# Patient Record
Sex: Male | Born: 1955
Health system: Southern US, Community
[De-identification: ages and names within clinical notes are randomized; demographics above are authoritative.]

## PROBLEM LIST (undated history)

## (undated) DIAGNOSIS — F209 Schizophrenia, unspecified: Secondary | ICD-10-CM

## (undated) DIAGNOSIS — I82409 Acute embolism and thrombosis of unspecified deep veins of unspecified lower extremity: Secondary | ICD-10-CM

## (undated) DIAGNOSIS — G40309 Generalized idiopathic epilepsy and epileptic syndromes, not intractable, without status epilepticus: Secondary | ICD-10-CM

## (undated) DIAGNOSIS — I739 Peripheral vascular disease, unspecified: Secondary | ICD-10-CM

## (undated) DIAGNOSIS — F319 Bipolar disorder, unspecified: Secondary | ICD-10-CM

## (undated) DIAGNOSIS — D649 Anemia, unspecified: Secondary | ICD-10-CM

## (undated) DIAGNOSIS — E119 Type 2 diabetes mellitus without complications: Secondary | ICD-10-CM

## (undated) DIAGNOSIS — M7500 Adhesive capsulitis of unspecified shoulder: Secondary | ICD-10-CM

## (undated) DIAGNOSIS — G609 Hereditary and idiopathic neuropathy, unspecified: Secondary | ICD-10-CM

## (undated) DIAGNOSIS — M545 Low back pain, unspecified: Secondary | ICD-10-CM

## (undated) DIAGNOSIS — G40409 Other generalized epilepsy and epileptic syndromes, not intractable, without status epilepticus: Secondary | ICD-10-CM

## (undated) DIAGNOSIS — E785 Hyperlipidemia, unspecified: Secondary | ICD-10-CM

## (undated) DIAGNOSIS — I639 Cerebral infarction, unspecified: Secondary | ICD-10-CM

## (undated) DIAGNOSIS — I10 Essential (primary) hypertension: Secondary | ICD-10-CM

## (undated) DIAGNOSIS — R251 Tremor, unspecified: Secondary | ICD-10-CM

## (undated) HISTORY — DX: Acute embolism and thrombosis of unspecified deep veins of unspecified lower extremity: I82.409

## (undated) HISTORY — DX: Peripheral vascular disease, unspecified: I73.9

## (undated) HISTORY — DX: Tremor, unspecified: R25.1

## (undated) HISTORY — DX: Low back pain, unspecified: M54.50

## (undated) HISTORY — DX: Other generalized epilepsy and epileptic syndromes, not intractable, without status epilepticus: G40.409

## (undated) HISTORY — DX: Hereditary and idiopathic neuropathy, unspecified: G60.9

## (undated) HISTORY — DX: Hyperlipidemia, unspecified: E78.5

## (undated) HISTORY — PX: ABOVE KNEE LEG AMPUTATION: SUR20

## (undated) HISTORY — DX: Bipolar disorder, unspecified: F31.9

## (undated) HISTORY — DX: Cerebral infarction, unspecified: I63.9

## (undated) HISTORY — DX: Type 2 diabetes mellitus without complications: E11.9

## (undated) HISTORY — DX: Adhesive capsulitis of unspecified shoulder: M75.00

## (undated) HISTORY — DX: Schizophrenia, unspecified: F20.9

## (undated) HISTORY — DX: Essential (primary) hypertension: I10

## (undated) HISTORY — DX: Anemia, unspecified: D64.9

## (undated) HISTORY — DX: Low back pain: M54.5

## (undated) HISTORY — PX: CORONARY STENT PLACEMENT: SHX6853

## (undated) HISTORY — DX: Generalized idiopathic epilepsy and epileptic syndromes, not intractable, without status epilepticus: G40.309

---

## 2011-07-08 ENCOUNTER — Encounter: Payer: Self-pay | Admitting: Vascular Surgery

## 2011-07-11 ENCOUNTER — Encounter: Payer: Self-pay | Admitting: Vascular Surgery

## 2011-07-12 ENCOUNTER — Ambulatory Visit (INDEPENDENT_AMBULATORY_CARE_PROVIDER_SITE_OTHER): Payer: Medicaid Other | Admitting: Vascular Surgery

## 2011-07-12 ENCOUNTER — Encounter: Payer: Self-pay | Admitting: Vascular Surgery

## 2011-07-12 VITALS — BP 165/76 | HR 51 | Resp 18 | Ht 72.0 in | Wt 250.8 lb

## 2011-07-12 DIAGNOSIS — I739 Peripheral vascular disease, unspecified: Secondary | ICD-10-CM | POA: Insufficient documentation

## 2011-07-12 HISTORY — DX: Peripheral vascular disease, unspecified: I73.9

## 2011-07-12 NOTE — Progress Notes (Signed)
[facility] and Vein Specialist of [facility]   Patient name: Matthew Gutierrez MRN: [id number] DOB: [birth year] Sex: male   Referred by: Marina Goodell  Reason for referral:  Chief Complaint  Patient presents with  . New Evaluation    referral by Dr. Marina Goodell abnormal ABI    HISTORY OF PRESENT ILLNESS: The patient presents today for evaluation of lower extremity discomfort and possible lower extremity interior of insufficiency. He has multiple lower surety complaints. He reports difficulty with prolonged walking he reports pain throughout his buttocks thighs and legs. He is a poor story and is difficult for him to give a good history of the pain. This has made it impossible for him to work since 2009 and he is seeking disability. He does report peripheral neuropathy and a low back disc disease. He does not have any history of tissue loss of his lower extremities.   Past Medical History  Diagnosis Date  . Hypertension   . Hyperlipidemia   . Diabetes mellitus   . Lumbago   . Peripheral vascular disease   . Frozen shoulder     Right Shoulder  . Epilepsy, grand mal   . Unspecified hereditary and idiopathic peripheral neuropathy   . Stroke     No past surgical history on file.  History   Social History  . Marital Status: Single    Spouse Name: N/A    Number of Children: N/A  . Years of Education: N/A   Occupational History  . Not on file.   Social History Main Topics  . Smoking status: Former Smoker    Quit date: 10/28/2009  . Smokeless tobacco: Not on file  . Alcohol Use: Yes     occassionally  . Drug Use: No  . Sexually Active:    Other Topics Concern  . Not on file   Social History Narrative  . No narrative on file    Family History  Problem Relation Age of Onset  . Hypertension Other   . Stroke Other   . Hyperlipidemia Other   . Alcohol abuse Other   . Heart disease Other   . Cancer Other     Allergies as of 07/12/2011 - Review Complete 07/12/2011  Allergen  Reaction Noted  . Codeine  07/08/2011    Current Outpatient Prescriptions on File Prior to Visit  Medication Sig Dispense Refill  . aspirin 81 MG tablet Take 81 mg by mouth daily.      Marland Kitchen atenolol-chlorthalidone (TENORETIC) 100-25 MG per tablet Take 1 tablet by mouth daily.      . clopidogrel (PLAVIX) 75 MG tablet Take 75 mg by mouth daily.      Marland Kitchen glimepiride (AMARYL) 2 MG tablet Take 2 mg by mouth daily before breakfast.      . lisinopril (PRINIVIL,ZESTRIL) 20 MG tablet Take 20 mg by mouth daily.      . metFORMIN (GLUCOPHAGE) 1000 MG tablet Take 1,000 mg by mouth 2 (two) times daily with a meal.      . Omega-3 Fatty Acids (RA FISH OIL) 1000 MG CPDR Take by mouth.      . phenytoin (DILANTIN) 100 MG ER capsule Take 200 mg by mouth 3 (three) times daily.      . pravastatin (PRAVACHOL) 20 MG tablet Take 20 mg by mouth daily.         REVIEW OF SYSTEMS:  Positives indicated with an "X"  CARDIOVASCULAR:  [ ]  chest pain   [ ]  chest pressure   [ ]   palpitations   [ ]  orthopnea   [ ]  dyspnea on exertion   [ ]  claudication   [x ] rest pain   [ ]  DVT   [ ]  phlebitis PULMONARY:   [ ]  productive cough   [ ]  asthma   [ ]  wheezing NEUROLOGIC:   [x ] weakness  [x ] paresthesias  [ ]  aphasia  [ ]  amaurosis  [ ]  dizziness HEMATOLOGIC:   [ ]  bleeding problems   [ ]  clotting disorders MUSCULOSKELETAL:  [ ]  joint pain   [ ]  joint swelling GASTROINTESTINAL: [x ]  blood in stool  [ ]   hematemesis GENITOURINARY:  [x ]  dysuria  [ ]   hematuria PSYCHIATRIC:  [ ]  history of major depression INTEGUMENTARY:  [x ] rashes  [ ]  ulcers CONSTITUTIONAL:  [ ]  fever   [ ]  chills  PHYSICAL EXAMINATION:  General: The patient is a well-nourished male, in no acute distress. Vital signs are BP 165/76  Pulse 51  Resp 18  Ht 6' (1.829 m)  Wt 250 lb 12.8 oz (113.762 kg)  BMI 34.01 kg/m2 Pulmonary: There is a good air exchange bilaterally without wheezing or rales. Abdomen: Soft and non-tender with normal pitch bowel  sounds. Musculoskeletal: There are no major deformities.  There is no significant extremity pain. Neurologic: No focal weakness or paresthesias are detected, Skin: There are no ulcer or rashes noted. Psychiatric: The patient has normal affect. Cardiovascular: There is a regular rate and rhythm without significant murmur appreciated. Carotid arteries: No bruits bilaterally Pulse status: 2+ radial 2+ femoral and 2+ popliteal pulses bilaterally. Do not palpate pedal pulses   Vascular Lab Studies:  I have lower extremity Doppler studies done at [facility] on 06/16/2011 these revealed ankle arm index of 0.84 on the right and 0.96 on the left with dampened waveforms.  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 This certainly does not correspond with the leg pain that he reports. He has subtotal leg discomfort in this appears to be more related to arthritis and degenerative disc disease. I discussed this at length with the patient and his brother-in-law present. He does not have any typical calf claudication and does not have arterial rest pain. He does have extensive neuropathy as well. I would not recommend any further evaluation or followup to do is mild tibial vessel disease. I did explain that if he should develop nonhealing wounds on his foot he needs to notify his physician immediately because this may require treatment. He will see [place] again on an as-needed basis    Lilliana Turner [facility] of [place] Office: [phone]

## 2014-06-05 DIAGNOSIS — N400 Enlarged prostate without lower urinary tract symptoms: Secondary | ICD-10-CM | POA: Diagnosis not present

## 2014-06-05 DIAGNOSIS — N183 Chronic kidney disease, stage 3 (moderate): Secondary | ICD-10-CM | POA: Diagnosis not present

## 2014-06-05 DIAGNOSIS — E119 Type 2 diabetes mellitus without complications: Secondary | ICD-10-CM | POA: Diagnosis not present

## 2014-06-05 DIAGNOSIS — Z6841 Body Mass Index (BMI) 40.0 and over, adult: Secondary | ICD-10-CM | POA: Diagnosis not present

## 2014-06-05 DIAGNOSIS — D696 Thrombocytopenia, unspecified: Secondary | ICD-10-CM | POA: Diagnosis not present

## 2014-06-05 DIAGNOSIS — I872 Venous insufficiency (chronic) (peripheral): Secondary | ICD-10-CM | POA: Diagnosis not present

## 2014-06-05 DIAGNOSIS — E1129 Type 2 diabetes mellitus with other diabetic kidney complication: Secondary | ICD-10-CM | POA: Diagnosis not present

## 2014-06-05 DIAGNOSIS — I10 Essential (primary) hypertension: Secondary | ICD-10-CM | POA: Diagnosis not present

## 2014-06-05 DIAGNOSIS — E1142 Type 2 diabetes mellitus with diabetic polyneuropathy: Secondary | ICD-10-CM | POA: Diagnosis not present

## 2014-06-05 DIAGNOSIS — I808 Phlebitis and thrombophlebitis of other sites: Secondary | ICD-10-CM | POA: Diagnosis not present

## 2014-06-05 DIAGNOSIS — E785 Hyperlipidemia, unspecified: Secondary | ICD-10-CM | POA: Diagnosis not present

## 2014-06-05 DIAGNOSIS — E1149 Type 2 diabetes mellitus with other diabetic neurological complication: Secondary | ICD-10-CM | POA: Diagnosis not present

## 2014-06-16 DIAGNOSIS — Z6841 Body Mass Index (BMI) 40.0 and over, adult: Secondary | ICD-10-CM | POA: Diagnosis not present

## 2014-06-16 DIAGNOSIS — L0390 Cellulitis, unspecified: Secondary | ICD-10-CM | POA: Diagnosis not present

## 2014-06-16 DIAGNOSIS — L97909 Non-pressure chronic ulcer of unspecified part of unspecified lower leg with unspecified severity: Secondary | ICD-10-CM | POA: Diagnosis not present

## 2014-06-16 DIAGNOSIS — L03116 Cellulitis of left lower limb: Secondary | ICD-10-CM | POA: Diagnosis not present

## 2014-06-24 DIAGNOSIS — I4891 Unspecified atrial fibrillation: Secondary | ICD-10-CM | POA: Diagnosis not present

## 2014-06-24 DIAGNOSIS — L97921 Non-pressure chronic ulcer of unspecified part of left lower leg limited to breakdown of skin: Secondary | ICD-10-CM | POA: Diagnosis not present

## 2014-06-24 DIAGNOSIS — L97919 Non-pressure chronic ulcer of unspecified part of right lower leg with unspecified severity: Secondary | ICD-10-CM | POA: Diagnosis not present

## 2014-06-24 DIAGNOSIS — Z6841 Body Mass Index (BMI) 40.0 and over, adult: Secondary | ICD-10-CM | POA: Diagnosis not present

## 2014-06-24 DIAGNOSIS — E1142 Type 2 diabetes mellitus with diabetic polyneuropathy: Secondary | ICD-10-CM | POA: Diagnosis not present

## 2014-06-27 DIAGNOSIS — L97919 Non-pressure chronic ulcer of unspecified part of right lower leg with unspecified severity: Secondary | ICD-10-CM | POA: Diagnosis not present

## 2014-06-27 DIAGNOSIS — L97929 Non-pressure chronic ulcer of unspecified part of left lower leg with unspecified severity: Secondary | ICD-10-CM | POA: Diagnosis not present

## 2014-06-27 DIAGNOSIS — Z6839 Body mass index (BMI) 39.0-39.9, adult: Secondary | ICD-10-CM | POA: Diagnosis not present

## 2014-07-01 DIAGNOSIS — L97929 Non-pressure chronic ulcer of unspecified part of left lower leg with unspecified severity: Secondary | ICD-10-CM | POA: Diagnosis not present

## 2014-07-01 DIAGNOSIS — Z6839 Body mass index (BMI) 39.0-39.9, adult: Secondary | ICD-10-CM | POA: Diagnosis not present

## 2014-07-04 DIAGNOSIS — L97929 Non-pressure chronic ulcer of unspecified part of left lower leg with unspecified severity: Secondary | ICD-10-CM | POA: Diagnosis not present

## 2014-07-23 DIAGNOSIS — D696 Thrombocytopenia, unspecified: Secondary | ICD-10-CM | POA: Diagnosis not present

## 2014-07-23 DIAGNOSIS — D609 Acquired pure red cell aplasia, unspecified: Secondary | ICD-10-CM | POA: Diagnosis not present

## 2014-08-18 DIAGNOSIS — L97929 Non-pressure chronic ulcer of unspecified part of left lower leg with unspecified severity: Secondary | ICD-10-CM | POA: Diagnosis not present

## 2014-08-18 DIAGNOSIS — L97919 Non-pressure chronic ulcer of unspecified part of right lower leg with unspecified severity: Secondary | ICD-10-CM | POA: Diagnosis not present

## 2014-08-21 DIAGNOSIS — I809 Phlebitis and thrombophlebitis of unspecified site: Secondary | ICD-10-CM | POA: Diagnosis not present

## 2014-08-21 DIAGNOSIS — L97919 Non-pressure chronic ulcer of unspecified part of right lower leg with unspecified severity: Secondary | ICD-10-CM | POA: Diagnosis not present

## 2014-08-21 DIAGNOSIS — Z6841 Body Mass Index (BMI) 40.0 and over, adult: Secondary | ICD-10-CM | POA: Diagnosis not present

## 2014-08-21 DIAGNOSIS — L97929 Non-pressure chronic ulcer of unspecified part of left lower leg with unspecified severity: Secondary | ICD-10-CM | POA: Diagnosis not present

## 2014-08-26 DIAGNOSIS — L97929 Non-pressure chronic ulcer of unspecified part of left lower leg with unspecified severity: Secondary | ICD-10-CM | POA: Diagnosis not present

## 2014-08-26 DIAGNOSIS — L97919 Non-pressure chronic ulcer of unspecified part of right lower leg with unspecified severity: Secondary | ICD-10-CM | POA: Diagnosis not present

## 2014-08-26 DIAGNOSIS — Z6841 Body Mass Index (BMI) 40.0 and over, adult: Secondary | ICD-10-CM | POA: Diagnosis not present

## 2014-09-02 DIAGNOSIS — I739 Peripheral vascular disease, unspecified: Secondary | ICD-10-CM | POA: Diagnosis not present

## 2014-09-02 DIAGNOSIS — I872 Venous insufficiency (chronic) (peripheral): Secondary | ICD-10-CM | POA: Diagnosis not present

## 2014-09-11 ENCOUNTER — Other Ambulatory Visit: Payer: Self-pay | Admitting: *Deleted

## 2014-09-11 DIAGNOSIS — I872 Venous insufficiency (chronic) (peripheral): Secondary | ICD-10-CM

## 2014-09-11 DIAGNOSIS — L97909 Non-pressure chronic ulcer of unspecified part of unspecified lower leg with unspecified severity: Principal | ICD-10-CM

## 2014-09-11 DIAGNOSIS — I83009 Varicose veins of unspecified lower extremity with ulcer of unspecified site: Secondary | ICD-10-CM

## 2014-09-18 DIAGNOSIS — K921 Melena: Secondary | ICD-10-CM | POA: Diagnosis not present

## 2014-09-18 DIAGNOSIS — K648 Other hemorrhoids: Secondary | ICD-10-CM | POA: Diagnosis not present

## 2014-09-23 DIAGNOSIS — I252 Old myocardial infarction: Secondary | ICD-10-CM | POA: Diagnosis not present

## 2014-09-23 DIAGNOSIS — L97929 Non-pressure chronic ulcer of unspecified part of left lower leg with unspecified severity: Secondary | ICD-10-CM | POA: Diagnosis not present

## 2014-09-23 DIAGNOSIS — I872 Venous insufficiency (chronic) (peripheral): Secondary | ICD-10-CM | POA: Diagnosis not present

## 2014-09-23 DIAGNOSIS — I739 Peripheral vascular disease, unspecified: Secondary | ICD-10-CM | POA: Diagnosis not present

## 2014-09-23 DIAGNOSIS — L97919 Non-pressure chronic ulcer of unspecified part of right lower leg with unspecified severity: Secondary | ICD-10-CM | POA: Diagnosis not present

## 2014-09-23 DIAGNOSIS — D696 Thrombocytopenia, unspecified: Secondary | ICD-10-CM | POA: Diagnosis not present

## 2014-09-29 DIAGNOSIS — N183 Chronic kidney disease, stage 3 (moderate): Secondary | ICD-10-CM | POA: Diagnosis not present

## 2014-09-29 DIAGNOSIS — G40309 Generalized idiopathic epilepsy and epileptic syndromes, not intractable, without status epilepticus: Secondary | ICD-10-CM | POA: Diagnosis not present

## 2014-09-29 DIAGNOSIS — E1140 Type 2 diabetes mellitus with diabetic neuropathy, unspecified: Secondary | ICD-10-CM | POA: Diagnosis not present

## 2014-09-29 DIAGNOSIS — E785 Hyperlipidemia, unspecified: Secondary | ICD-10-CM | POA: Diagnosis not present

## 2014-09-29 DIAGNOSIS — E11621 Type 2 diabetes mellitus with foot ulcer: Secondary | ICD-10-CM | POA: Diagnosis not present

## 2014-09-29 DIAGNOSIS — E1142 Type 2 diabetes mellitus with diabetic polyneuropathy: Secondary | ICD-10-CM | POA: Diagnosis not present

## 2014-09-29 DIAGNOSIS — E1129 Type 2 diabetes mellitus with other diabetic kidney complication: Secondary | ICD-10-CM | POA: Diagnosis not present

## 2014-09-29 DIAGNOSIS — I10 Essential (primary) hypertension: Secondary | ICD-10-CM | POA: Diagnosis not present

## 2014-09-30 DIAGNOSIS — D696 Thrombocytopenia, unspecified: Secondary | ICD-10-CM | POA: Diagnosis not present

## 2014-09-30 DIAGNOSIS — I252 Old myocardial infarction: Secondary | ICD-10-CM | POA: Diagnosis not present

## 2014-09-30 DIAGNOSIS — E1129 Type 2 diabetes mellitus with other diabetic kidney complication: Secondary | ICD-10-CM | POA: Diagnosis not present

## 2014-09-30 DIAGNOSIS — E785 Hyperlipidemia, unspecified: Secondary | ICD-10-CM | POA: Diagnosis not present

## 2014-09-30 DIAGNOSIS — E11621 Type 2 diabetes mellitus with foot ulcer: Secondary | ICD-10-CM | POA: Diagnosis not present

## 2014-09-30 DIAGNOSIS — I10 Essential (primary) hypertension: Secondary | ICD-10-CM | POA: Diagnosis not present

## 2014-10-07 DIAGNOSIS — Z7901 Long term (current) use of anticoagulants: Secondary | ICD-10-CM | POA: Diagnosis not present

## 2014-10-14 DIAGNOSIS — Z1211 Encounter for screening for malignant neoplasm of colon: Secondary | ICD-10-CM | POA: Diagnosis not present

## 2014-10-14 DIAGNOSIS — E119 Type 2 diabetes mellitus without complications: Secondary | ICD-10-CM | POA: Diagnosis not present

## 2014-10-14 DIAGNOSIS — I252 Old myocardial infarction: Secondary | ICD-10-CM | POA: Diagnosis not present

## 2014-10-14 DIAGNOSIS — Z7901 Long term (current) use of anticoagulants: Secondary | ICD-10-CM | POA: Diagnosis not present

## 2014-10-14 DIAGNOSIS — J45909 Unspecified asthma, uncomplicated: Secondary | ICD-10-CM | POA: Diagnosis not present

## 2014-10-14 DIAGNOSIS — I10 Essential (primary) hypertension: Secondary | ICD-10-CM | POA: Diagnosis not present

## 2014-10-14 DIAGNOSIS — D126 Benign neoplasm of colon, unspecified: Secondary | ICD-10-CM | POA: Diagnosis not present

## 2014-10-14 DIAGNOSIS — G40909 Epilepsy, unspecified, not intractable, without status epilepticus: Secondary | ICD-10-CM | POA: Diagnosis not present

## 2014-10-14 DIAGNOSIS — K648 Other hemorrhoids: Secondary | ICD-10-CM | POA: Diagnosis not present

## 2014-10-14 DIAGNOSIS — K635 Polyp of colon: Secondary | ICD-10-CM | POA: Diagnosis not present

## 2014-10-14 DIAGNOSIS — D124 Benign neoplasm of descending colon: Secondary | ICD-10-CM | POA: Diagnosis not present

## 2014-10-17 ENCOUNTER — Encounter: Payer: Self-pay | Admitting: Vascular Surgery

## 2014-10-21 ENCOUNTER — Ambulatory Visit (HOSPITAL_COMMUNITY)
Admission: RE | Admit: 2014-10-21 | Discharge: 2014-10-21 | Disposition: A | Discharge status: Home / Self Care | Payer: Commercial Managed Care - HMO | Source: Ambulatory Visit | Attending: Vascular Surgery | Admitting: Vascular Surgery

## 2014-10-21 ENCOUNTER — Ambulatory Visit (INDEPENDENT_AMBULATORY_CARE_PROVIDER_SITE_OTHER): Payer: Commercial Managed Care - HMO | Admitting: Vascular Surgery

## 2014-10-21 ENCOUNTER — Encounter: Payer: Self-pay | Admitting: Vascular Surgery

## 2014-10-21 VITALS — BP 142/74 | HR 54 | Resp 18 | Ht 69.5 in | Wt 293.0 lb

## 2014-10-21 DIAGNOSIS — L97909 Non-pressure chronic ulcer of unspecified part of unspecified lower leg with unspecified severity: Secondary | ICD-10-CM

## 2014-10-21 DIAGNOSIS — I872 Venous insufficiency (chronic) (peripheral): Secondary | ICD-10-CM | POA: Diagnosis not present

## 2014-10-21 DIAGNOSIS — I83009 Varicose veins of unspecified lower extremity with ulcer of unspecified site: Secondary | ICD-10-CM

## 2014-10-21 DIAGNOSIS — M7989 Other specified soft tissue disorders: Secondary | ICD-10-CM | POA: Diagnosis not present

## 2014-10-21 NOTE — Progress Notes (Signed)
Patient name: Matthew Gutierrez MRN: [id number] DOB: [birth year] Sex: male   Referred by: Henrene Pastor  Reason for referral:  Chief Complaint  Patient presents with  . New Evaluation    referred by By Reinaldo Meeker MD  left and right calves swollen R>L,  right leg blistering, pink, itching, scabbing for 6-8 months    . Varicose Veins    HISTORY OF PRESENT ILLNESS: Patient presents today for evaluation of skin changes in both lower extremities right greater than left. I'd seen him in 2013 concern regarding arterial insufficiency. He has a remote history of probable stenting and is arterial segments in [name] many years ago. He is a very poor historian. He is here today with a friend who is providing some history as well. He has had the worsening ulcerations and skin changes with blistering over both calves. This is worse on the right leg than on the left leg.  no prior documented venous hypertension. Does have history diabetes. History of DVT  Past Medical History  Diagnosis Date  . Hypertension   . Hyperlipidemia   . Diabetes mellitus   . Lumbago   . Peripheral vascular disease   . Frozen shoulder     Right Shoulder  . Epilepsy, grand mal   . Unspecified hereditary and idiopathic peripheral neuropathy   . Stroke   . DVT (deep venous thrombosis)     History reviewed. No pertinent past surgical history.  History   Social History  . Marital Status: Single    Spouse Name: N/A  . Number of Children: N/A  . Years of Education: N/A   Occupational History  . Not on file.   Social History Main Topics  . Smoking status: Former Smoker    Quit date: 10/28/2009  . Smokeless tobacco: Not on file  . Alcohol Use: Yes     Comment: occassionally  . Drug Use: No  . Sexual Activity: Not on file   Other Topics Concern  . Not on file   Social History Narrative    Family History  Problem Relation Age of Onset  . Hypertension Other   . Stroke Other   . Hyperlipidemia Other     . Alcohol abuse Other   . Heart disease Other   . Cancer Other     Allergies as of 10/21/2014 - Review Complete 10/21/2014  Allergen Reaction Noted  . Codeine  07/08/2011    Current Outpatient Prescriptions on File Prior to Visit  Medication Sig Dispense Refill  . atenolol-chlorthalidone (TENORETIC) 100-25 MG per tablet Take 1 tablet by mouth daily.    . celecoxib (CELEBREX) 200 MG capsule Take 200 mg by mouth as needed.     . gabapentin (NEURONTIN) 300 MG capsule Take 300 mg by mouth at bedtime.    Marland Kitchen glimepiride (AMARYL) 2 MG tablet Take 2 mg by mouth as needed.     Marland Kitchen lisinopril (PRINIVIL,ZESTRIL) 20 MG tablet Take 20 mg by mouth daily.    . metFORMIN (GLUCOPHAGE) 1000 MG tablet Take 1,000 mg by mouth 2 (two) times daily with a meal.    . phenytoin (DILANTIN) 100 MG ER capsule Take 200 mg by mouth 3 (three) times daily.    . pravastatin (PRAVACHOL) 20 MG tablet Take 20 mg by mouth daily.    Marland Kitchen aspirin 81 MG tablet Take 81 mg by mouth daily.    . clopidogrel (PLAVIX) 75 MG tablet Take 75 mg by mouth daily.    . Omega-3  Fatty Acids (RA FISH OIL) 1000 MG CPDR Take by mouth.     No current facility-administered medications on file prior to visit.      PHYSICAL EXAMINATION:  General: The patient is a well-nourished male, in no acute distress. Vital signs are BP 142/74 mmHg  Pulse 54  Resp 18  Ht 5' 9.5" (1.765 m)  Wt 293 lb (132.904 kg)  BMI 42.66 kg/m2 Pulmonary: There is a good air exchange  Musculoskeletal: There are no major deformities.  There is no significant extremity pain. Neurologic: No focal weakness or paresthesias are detected, Skin: Changes over both calves much more dramatic on the right than on the left. He has a very irregular pink skin changes circumferentially from just below his knee down to his ankle. The skin on his feet bilaterally are completely normal and he has no swelling in his feet. This is very irregular with superficial blistering and crusting over  where he had had blisters before. Psychiatric: The patient has normal affect. Cardiovascular: Palpable radial pulses.   [facility] Vascular Lab Studies:  Ordered and Independently Reviewed venous duplex showed no evidence of any venous hypertension or enlargement in the surface veins bilaterally. He has no evidence of DVT or occlusion of his deep veins. He does have reflux limited to his common femoral veins bilaterally  Impression and Plan:  I discussed these findings at length with the patient. I explained that this is not related to arterial insufficiency. He does have mild lower from the arterial insufficiency. Also explained that the noninvasive studies are extremely reliable and showed no evidence of venous hypertension as the cause of this as well. Skin changes on his leg are quite unusual. This does not appear to be related to lymphedema. He has no changes in his feet bilaterally. I am unclear as to the etiology of his skin changes and would consider potential dermatology evaluation. I did explain the importance of compression to keep the swelling out of this. He reports that he is having what sounds like Unna boots placed at Dr. Blanch Media office. We have placed bilateral Unna boots on him today and he will follow-up later this week with Dr. Henrene Pastor. We will see him again on as-needed basis    Rifky Lapre [facility] [place] Office: [phone]

## 2014-10-21 NOTE — Progress Notes (Signed)
Filed Vitals:   10/21/14 1207 10/21/14 1208  BP: 154/72 142/74  Pulse: 54   Resp: 18   Height: 5' 9.5" (1.765 m)   Weight: 293 lb (132.904 kg)   Body mass index is 42.66 kg/(m^2).

## 2014-11-17 DIAGNOSIS — Z7901 Long term (current) use of anticoagulants: Secondary | ICD-10-CM | POA: Diagnosis not present

## 2014-11-18 DIAGNOSIS — Z6841 Body Mass Index (BMI) 40.0 and over, adult: Secondary | ICD-10-CM | POA: Diagnosis not present

## 2014-11-18 DIAGNOSIS — H1010 Acute atopic conjunctivitis, unspecified eye: Secondary | ICD-10-CM | POA: Diagnosis not present

## 2014-11-25 DIAGNOSIS — Z7901 Long term (current) use of anticoagulants: Secondary | ICD-10-CM | POA: Diagnosis not present

## 2014-11-25 DIAGNOSIS — Z6841 Body Mass Index (BMI) 40.0 and over, adult: Secondary | ICD-10-CM | POA: Diagnosis not present

## 2014-11-25 DIAGNOSIS — H1012 Acute atopic conjunctivitis, left eye: Secondary | ICD-10-CM | POA: Diagnosis not present

## 2014-11-25 DIAGNOSIS — G40309 Generalized idiopathic epilepsy and epileptic syndromes, not intractable, without status epilepticus: Secondary | ICD-10-CM | POA: Diagnosis not present

## 2014-12-02 DIAGNOSIS — Z6841 Body Mass Index (BMI) 40.0 and over, adult: Secondary | ICD-10-CM | POA: Diagnosis not present

## 2014-12-02 DIAGNOSIS — J180 Bronchopneumonia, unspecified organism: Secondary | ICD-10-CM | POA: Diagnosis not present

## 2014-12-25 DIAGNOSIS — Z7901 Long term (current) use of anticoagulants: Secondary | ICD-10-CM | POA: Diagnosis not present

## 2015-01-08 DIAGNOSIS — Z7901 Long term (current) use of anticoagulants: Secondary | ICD-10-CM | POA: Diagnosis not present

## 2015-02-12 DIAGNOSIS — E1142 Type 2 diabetes mellitus with diabetic polyneuropathy: Secondary | ICD-10-CM | POA: Diagnosis not present

## 2015-02-12 DIAGNOSIS — E1149 Type 2 diabetes mellitus with other diabetic neurological complication: Secondary | ICD-10-CM | POA: Diagnosis not present

## 2015-02-12 DIAGNOSIS — I10 Essential (primary) hypertension: Secondary | ICD-10-CM | POA: Diagnosis not present

## 2015-02-12 DIAGNOSIS — N183 Chronic kidney disease, stage 3 (moderate): Secondary | ICD-10-CM | POA: Diagnosis not present

## 2015-02-12 DIAGNOSIS — E1129 Type 2 diabetes mellitus with other diabetic kidney complication: Secondary | ICD-10-CM | POA: Diagnosis not present

## 2015-02-12 DIAGNOSIS — E785 Hyperlipidemia, unspecified: Secondary | ICD-10-CM | POA: Diagnosis not present

## 2015-02-12 DIAGNOSIS — Z7901 Long term (current) use of anticoagulants: Secondary | ICD-10-CM | POA: Diagnosis not present

## 2015-02-12 DIAGNOSIS — E11621 Type 2 diabetes mellitus with foot ulcer: Secondary | ICD-10-CM | POA: Diagnosis not present

## 2015-02-12 DIAGNOSIS — I2510 Atherosclerotic heart disease of native coronary artery without angina pectoris: Secondary | ICD-10-CM | POA: Diagnosis not present

## 2015-04-02 DIAGNOSIS — E119 Type 2 diabetes mellitus without complications: Secondary | ICD-10-CM | POA: Diagnosis not present

## 2015-04-02 DIAGNOSIS — H2513 Age-related nuclear cataract, bilateral: Secondary | ICD-10-CM | POA: Diagnosis not present

## 2015-06-04 DIAGNOSIS — Z7901 Long term (current) use of anticoagulants: Secondary | ICD-10-CM | POA: Diagnosis not present

## 2015-06-16 DIAGNOSIS — L97929 Non-pressure chronic ulcer of unspecified part of left lower leg with unspecified severity: Secondary | ICD-10-CM | POA: Diagnosis not present

## 2015-06-16 DIAGNOSIS — L97919 Non-pressure chronic ulcer of unspecified part of right lower leg with unspecified severity: Secondary | ICD-10-CM | POA: Diagnosis not present

## 2015-06-16 DIAGNOSIS — Z6841 Body Mass Index (BMI) 40.0 and over, adult: Secondary | ICD-10-CM | POA: Diagnosis not present

## 2015-06-16 DIAGNOSIS — L0390 Cellulitis, unspecified: Secondary | ICD-10-CM | POA: Diagnosis not present

## 2015-06-19 DIAGNOSIS — L97929 Non-pressure chronic ulcer of unspecified part of left lower leg with unspecified severity: Secondary | ICD-10-CM | POA: Diagnosis not present

## 2015-06-19 DIAGNOSIS — Z6841 Body Mass Index (BMI) 40.0 and over, adult: Secondary | ICD-10-CM | POA: Diagnosis not present

## 2015-06-19 DIAGNOSIS — M7581 Other shoulder lesions, right shoulder: Secondary | ICD-10-CM | POA: Diagnosis not present

## 2015-06-19 DIAGNOSIS — L97919 Non-pressure chronic ulcer of unspecified part of right lower leg with unspecified severity: Secondary | ICD-10-CM | POA: Diagnosis not present

## 2015-06-22 DIAGNOSIS — N400 Enlarged prostate without lower urinary tract symptoms: Secondary | ICD-10-CM | POA: Diagnosis not present

## 2015-06-22 DIAGNOSIS — M109 Gout, unspecified: Secondary | ICD-10-CM | POA: Diagnosis not present

## 2015-06-22 DIAGNOSIS — N183 Chronic kidney disease, stage 3 (moderate): Secondary | ICD-10-CM | POA: Diagnosis not present

## 2015-06-22 DIAGNOSIS — I10 Essential (primary) hypertension: Secondary | ICD-10-CM | POA: Diagnosis not present

## 2015-06-22 DIAGNOSIS — E785 Hyperlipidemia, unspecified: Secondary | ICD-10-CM | POA: Diagnosis not present

## 2015-06-22 DIAGNOSIS — E1142 Type 2 diabetes mellitus with diabetic polyneuropathy: Secondary | ICD-10-CM | POA: Diagnosis not present

## 2015-06-22 DIAGNOSIS — E11621 Type 2 diabetes mellitus with foot ulcer: Secondary | ICD-10-CM | POA: Diagnosis not present

## 2015-06-22 DIAGNOSIS — E1149 Type 2 diabetes mellitus with other diabetic neurological complication: Secondary | ICD-10-CM | POA: Diagnosis not present

## 2015-06-22 DIAGNOSIS — Z6841 Body Mass Index (BMI) 40.0 and over, adult: Secondary | ICD-10-CM | POA: Diagnosis not present

## 2015-06-22 DIAGNOSIS — E1129 Type 2 diabetes mellitus with other diabetic kidney complication: Secondary | ICD-10-CM | POA: Diagnosis not present

## 2015-06-26 DIAGNOSIS — L97929 Non-pressure chronic ulcer of unspecified part of left lower leg with unspecified severity: Secondary | ICD-10-CM | POA: Diagnosis not present

## 2015-06-26 DIAGNOSIS — Z6841 Body Mass Index (BMI) 40.0 and over, adult: Secondary | ICD-10-CM | POA: Diagnosis not present

## 2015-06-26 DIAGNOSIS — L97919 Non-pressure chronic ulcer of unspecified part of right lower leg with unspecified severity: Secondary | ICD-10-CM | POA: Diagnosis not present

## 2015-07-03 DIAGNOSIS — Z6839 Body mass index (BMI) 39.0-39.9, adult: Secondary | ICD-10-CM | POA: Diagnosis not present

## 2015-07-03 DIAGNOSIS — L97919 Non-pressure chronic ulcer of unspecified part of right lower leg with unspecified severity: Secondary | ICD-10-CM | POA: Diagnosis not present

## 2015-07-03 DIAGNOSIS — L97929 Non-pressure chronic ulcer of unspecified part of left lower leg with unspecified severity: Secondary | ICD-10-CM | POA: Diagnosis not present

## 2015-07-09 DIAGNOSIS — Z7901 Long term (current) use of anticoagulants: Secondary | ICD-10-CM | POA: Diagnosis not present

## 2015-07-10 DIAGNOSIS — Z6839 Body mass index (BMI) 39.0-39.9, adult: Secondary | ICD-10-CM | POA: Diagnosis not present

## 2015-07-10 DIAGNOSIS — L97929 Non-pressure chronic ulcer of unspecified part of left lower leg with unspecified severity: Secondary | ICD-10-CM | POA: Diagnosis not present

## 2015-07-10 DIAGNOSIS — I872 Venous insufficiency (chronic) (peripheral): Secondary | ICD-10-CM | POA: Diagnosis not present

## 2015-07-10 DIAGNOSIS — L97919 Non-pressure chronic ulcer of unspecified part of right lower leg with unspecified severity: Secondary | ICD-10-CM | POA: Diagnosis not present

## 2015-07-17 DIAGNOSIS — L97919 Non-pressure chronic ulcer of unspecified part of right lower leg with unspecified severity: Secondary | ICD-10-CM | POA: Diagnosis not present

## 2015-07-17 DIAGNOSIS — I872 Venous insufficiency (chronic) (peripheral): Secondary | ICD-10-CM | POA: Diagnosis not present

## 2015-07-17 DIAGNOSIS — L97929 Non-pressure chronic ulcer of unspecified part of left lower leg with unspecified severity: Secondary | ICD-10-CM | POA: Diagnosis not present

## 2015-07-17 DIAGNOSIS — Z6841 Body Mass Index (BMI) 40.0 and over, adult: Secondary | ICD-10-CM | POA: Diagnosis not present

## 2015-07-23 DIAGNOSIS — I10 Essential (primary) hypertension: Secondary | ICD-10-CM | POA: Diagnosis not present

## 2015-07-23 DIAGNOSIS — M25421 Effusion, right elbow: Secondary | ICD-10-CM | POA: Diagnosis not present

## 2015-07-23 DIAGNOSIS — Z7901 Long term (current) use of anticoagulants: Secondary | ICD-10-CM | POA: Diagnosis not present

## 2015-07-23 DIAGNOSIS — Z87891 Personal history of nicotine dependence: Secondary | ICD-10-CM | POA: Diagnosis not present

## 2015-07-23 DIAGNOSIS — Z86718 Personal history of other venous thrombosis and embolism: Secondary | ICD-10-CM | POA: Diagnosis not present

## 2015-07-23 DIAGNOSIS — M7989 Other specified soft tissue disorders: Secondary | ICD-10-CM | POA: Diagnosis not present

## 2015-07-23 DIAGNOSIS — E119 Type 2 diabetes mellitus without complications: Secondary | ICD-10-CM | POA: Diagnosis not present

## 2015-07-23 DIAGNOSIS — Z8673 Personal history of transient ischemic attack (TIA), and cerebral infarction without residual deficits: Secondary | ICD-10-CM | POA: Diagnosis not present

## 2015-07-24 DIAGNOSIS — M79641 Pain in right hand: Secondary | ICD-10-CM | POA: Diagnosis not present

## 2015-07-24 DIAGNOSIS — Z8673 Personal history of transient ischemic attack (TIA), and cerebral infarction without residual deficits: Secondary | ICD-10-CM | POA: Diagnosis not present

## 2015-07-24 DIAGNOSIS — M25521 Pain in right elbow: Secondary | ICD-10-CM | POA: Diagnosis not present

## 2015-07-24 DIAGNOSIS — R600 Localized edema: Secondary | ICD-10-CM | POA: Diagnosis not present

## 2015-07-24 DIAGNOSIS — Z7982 Long term (current) use of aspirin: Secondary | ICD-10-CM | POA: Diagnosis not present

## 2015-07-24 DIAGNOSIS — Z87891 Personal history of nicotine dependence: Secondary | ICD-10-CM | POA: Diagnosis not present

## 2015-07-24 DIAGNOSIS — G40909 Epilepsy, unspecified, not intractable, without status epilepticus: Secondary | ICD-10-CM | POA: Diagnosis not present

## 2015-07-24 DIAGNOSIS — S53104A Unspecified dislocation of right ulnohumeral joint, initial encounter: Secondary | ICD-10-CM | POA: Diagnosis not present

## 2015-07-24 DIAGNOSIS — M25421 Effusion, right elbow: Secondary | ICD-10-CM | POA: Diagnosis not present

## 2015-07-24 DIAGNOSIS — M79631 Pain in right forearm: Secondary | ICD-10-CM | POA: Diagnosis not present

## 2015-07-24 DIAGNOSIS — Z79891 Long term (current) use of opiate analgesic: Secondary | ICD-10-CM | POA: Diagnosis not present

## 2015-07-24 DIAGNOSIS — M7989 Other specified soft tissue disorders: Secondary | ICD-10-CM | POA: Diagnosis not present

## 2015-07-24 DIAGNOSIS — M79601 Pain in right arm: Secondary | ICD-10-CM | POA: Diagnosis not present

## 2015-07-30 DIAGNOSIS — Z6841 Body Mass Index (BMI) 40.0 and over, adult: Secondary | ICD-10-CM | POA: Diagnosis not present

## 2015-07-30 DIAGNOSIS — L97929 Non-pressure chronic ulcer of unspecified part of left lower leg with unspecified severity: Secondary | ICD-10-CM | POA: Diagnosis not present

## 2015-07-30 DIAGNOSIS — I808 Phlebitis and thrombophlebitis of other sites: Secondary | ICD-10-CM | POA: Diagnosis not present

## 2015-07-30 DIAGNOSIS — I872 Venous insufficiency (chronic) (peripheral): Secondary | ICD-10-CM | POA: Diagnosis not present

## 2015-07-30 DIAGNOSIS — E11621 Type 2 diabetes mellitus with foot ulcer: Secondary | ICD-10-CM | POA: Diagnosis not present

## 2015-08-11 DIAGNOSIS — M7501 Adhesive capsulitis of right shoulder: Secondary | ICD-10-CM | POA: Diagnosis not present

## 2015-08-11 DIAGNOSIS — Z6839 Body mass index (BMI) 39.0-39.9, adult: Secondary | ICD-10-CM | POA: Diagnosis not present

## 2015-08-11 DIAGNOSIS — Z7901 Long term (current) use of anticoagulants: Secondary | ICD-10-CM | POA: Diagnosis not present

## 2015-08-17 DIAGNOSIS — M179 Osteoarthritis of knee, unspecified: Secondary | ICD-10-CM | POA: Diagnosis not present

## 2015-08-20 DIAGNOSIS — M19011 Primary osteoarthritis, right shoulder: Secondary | ICD-10-CM | POA: Diagnosis not present

## 2015-10-19 DIAGNOSIS — E1142 Type 2 diabetes mellitus with diabetic polyneuropathy: Secondary | ICD-10-CM | POA: Diagnosis not present

## 2015-10-19 DIAGNOSIS — I10 Essential (primary) hypertension: Secondary | ICD-10-CM | POA: Diagnosis not present

## 2015-10-19 DIAGNOSIS — Z7901 Long term (current) use of anticoagulants: Secondary | ICD-10-CM | POA: Diagnosis not present

## 2015-10-19 DIAGNOSIS — E1149 Type 2 diabetes mellitus with other diabetic neurological complication: Secondary | ICD-10-CM | POA: Diagnosis not present

## 2015-10-19 DIAGNOSIS — N183 Chronic kidney disease, stage 3 (moderate): Secondary | ICD-10-CM | POA: Diagnosis not present

## 2015-10-19 DIAGNOSIS — Z6839 Body mass index (BMI) 39.0-39.9, adult: Secondary | ICD-10-CM | POA: Diagnosis not present

## 2015-10-19 DIAGNOSIS — E11621 Type 2 diabetes mellitus with foot ulcer: Secondary | ICD-10-CM | POA: Diagnosis not present

## 2015-10-19 DIAGNOSIS — E785 Hyperlipidemia, unspecified: Secondary | ICD-10-CM | POA: Diagnosis not present

## 2015-10-19 DIAGNOSIS — I252 Old myocardial infarction: Secondary | ICD-10-CM | POA: Diagnosis not present

## 2015-11-26 DIAGNOSIS — Z7901 Long term (current) use of anticoagulants: Secondary | ICD-10-CM | POA: Diagnosis not present

## 2016-01-08 DIAGNOSIS — Z7901 Long term (current) use of anticoagulants: Secondary | ICD-10-CM | POA: Diagnosis not present

## 2016-01-26 DIAGNOSIS — Z7901 Long term (current) use of anticoagulants: Secondary | ICD-10-CM | POA: Diagnosis not present

## 2016-02-23 DIAGNOSIS — E1149 Type 2 diabetes mellitus with other diabetic neurological complication: Secondary | ICD-10-CM | POA: Diagnosis not present

## 2016-02-23 DIAGNOSIS — E11621 Type 2 diabetes mellitus with foot ulcer: Secondary | ICD-10-CM | POA: Diagnosis not present

## 2016-02-23 DIAGNOSIS — E1142 Type 2 diabetes mellitus with diabetic polyneuropathy: Secondary | ICD-10-CM | POA: Diagnosis not present

## 2016-02-23 DIAGNOSIS — E785 Hyperlipidemia, unspecified: Secondary | ICD-10-CM | POA: Diagnosis not present

## 2016-02-23 DIAGNOSIS — I739 Peripheral vascular disease, unspecified: Secondary | ICD-10-CM | POA: Diagnosis not present

## 2016-02-23 DIAGNOSIS — J180 Bronchopneumonia, unspecified organism: Secondary | ICD-10-CM | POA: Diagnosis not present

## 2016-02-23 DIAGNOSIS — E1129 Type 2 diabetes mellitus with other diabetic kidney complication: Secondary | ICD-10-CM | POA: Diagnosis not present

## 2016-02-23 DIAGNOSIS — I10 Essential (primary) hypertension: Secondary | ICD-10-CM | POA: Diagnosis not present

## 2016-03-22 DIAGNOSIS — Z7901 Long term (current) use of anticoagulants: Secondary | ICD-10-CM | POA: Diagnosis not present

## 2016-04-19 DIAGNOSIS — Z7901 Long term (current) use of anticoagulants: Secondary | ICD-10-CM | POA: Diagnosis not present

## 2016-05-10 DIAGNOSIS — Z23 Encounter for immunization: Secondary | ICD-10-CM | POA: Diagnosis not present

## 2016-05-10 DIAGNOSIS — I10 Essential (primary) hypertension: Secondary | ICD-10-CM | POA: Diagnosis not present

## 2016-05-10 DIAGNOSIS — N183 Chronic kidney disease, stage 3 (moderate): Secondary | ICD-10-CM | POA: Diagnosis not present

## 2016-05-10 DIAGNOSIS — E1122 Type 2 diabetes mellitus with diabetic chronic kidney disease: Secondary | ICD-10-CM | POA: Diagnosis not present

## 2016-05-10 DIAGNOSIS — E782 Mixed hyperlipidemia: Secondary | ICD-10-CM | POA: Diagnosis not present

## 2016-05-10 DIAGNOSIS — E11622 Type 2 diabetes mellitus with other skin ulcer: Secondary | ICD-10-CM | POA: Diagnosis not present

## 2016-05-10 DIAGNOSIS — I739 Peripheral vascular disease, unspecified: Secondary | ICD-10-CM | POA: Diagnosis not present

## 2016-05-12 DIAGNOSIS — I87319 Chronic venous hypertension (idiopathic) with ulcer of unspecified lower extremity: Secondary | ICD-10-CM | POA: Diagnosis not present

## 2016-05-12 DIAGNOSIS — E11621 Type 2 diabetes mellitus with foot ulcer: Secondary | ICD-10-CM | POA: Diagnosis not present

## 2016-05-12 DIAGNOSIS — E1165 Type 2 diabetes mellitus with hyperglycemia: Secondary | ICD-10-CM | POA: Diagnosis not present

## 2016-05-19 DIAGNOSIS — L97909 Non-pressure chronic ulcer of unspecified part of unspecified lower leg with unspecified severity: Secondary | ICD-10-CM | POA: Diagnosis not present

## 2016-05-19 DIAGNOSIS — Z7901 Long term (current) use of anticoagulants: Secondary | ICD-10-CM | POA: Diagnosis not present

## 2016-05-19 DIAGNOSIS — E11621 Type 2 diabetes mellitus with foot ulcer: Secondary | ICD-10-CM | POA: Diagnosis not present

## 2016-05-26 DIAGNOSIS — L97909 Non-pressure chronic ulcer of unspecified part of unspecified lower leg with unspecified severity: Secondary | ICD-10-CM | POA: Diagnosis not present

## 2016-05-26 DIAGNOSIS — E11621 Type 2 diabetes mellitus with foot ulcer: Secondary | ICD-10-CM | POA: Diagnosis not present

## 2016-06-08 DIAGNOSIS — L97909 Non-pressure chronic ulcer of unspecified part of unspecified lower leg with unspecified severity: Secondary | ICD-10-CM | POA: Diagnosis not present

## 2016-06-08 DIAGNOSIS — E11621 Type 2 diabetes mellitus with foot ulcer: Secondary | ICD-10-CM | POA: Diagnosis not present

## 2016-06-21 DIAGNOSIS — L97909 Non-pressure chronic ulcer of unspecified part of unspecified lower leg with unspecified severity: Secondary | ICD-10-CM | POA: Diagnosis not present

## 2016-06-21 DIAGNOSIS — E11621 Type 2 diabetes mellitus with foot ulcer: Secondary | ICD-10-CM | POA: Diagnosis not present

## 2016-06-21 DIAGNOSIS — Z7901 Long term (current) use of anticoagulants: Secondary | ICD-10-CM | POA: Diagnosis not present

## 2016-06-28 DIAGNOSIS — N183 Chronic kidney disease, stage 3 (moderate): Secondary | ICD-10-CM | POA: Diagnosis not present

## 2016-06-28 DIAGNOSIS — E1129 Type 2 diabetes mellitus with other diabetic kidney complication: Secondary | ICD-10-CM | POA: Diagnosis not present

## 2016-06-28 DIAGNOSIS — N400 Enlarged prostate without lower urinary tract symptoms: Secondary | ICD-10-CM | POA: Diagnosis not present

## 2016-06-28 DIAGNOSIS — E11621 Type 2 diabetes mellitus with foot ulcer: Secondary | ICD-10-CM | POA: Diagnosis not present

## 2016-06-28 DIAGNOSIS — E782 Mixed hyperlipidemia: Secondary | ICD-10-CM | POA: Diagnosis not present

## 2016-06-28 DIAGNOSIS — I10 Essential (primary) hypertension: Secondary | ICD-10-CM | POA: Diagnosis not present

## 2016-06-28 DIAGNOSIS — G40A09 Absence epileptic syndrome, not intractable, without status epilepticus: Secondary | ICD-10-CM | POA: Diagnosis not present

## 2016-06-28 DIAGNOSIS — E1140 Type 2 diabetes mellitus with diabetic neuropathy, unspecified: Secondary | ICD-10-CM | POA: Diagnosis not present

## 2016-06-28 DIAGNOSIS — I2510 Atherosclerotic heart disease of native coronary artery without angina pectoris: Secondary | ICD-10-CM | POA: Diagnosis not present

## 2016-07-11 DIAGNOSIS — E11621 Type 2 diabetes mellitus with foot ulcer: Secondary | ICD-10-CM | POA: Diagnosis not present

## 2016-07-11 DIAGNOSIS — L97909 Non-pressure chronic ulcer of unspecified part of unspecified lower leg with unspecified severity: Secondary | ICD-10-CM | POA: Diagnosis not present

## 2016-07-18 DIAGNOSIS — J180 Bronchopneumonia, unspecified organism: Secondary | ICD-10-CM | POA: Diagnosis not present

## 2016-07-18 DIAGNOSIS — L97909 Non-pressure chronic ulcer of unspecified part of unspecified lower leg with unspecified severity: Secondary | ICD-10-CM | POA: Diagnosis not present

## 2016-07-18 DIAGNOSIS — E11621 Type 2 diabetes mellitus with foot ulcer: Secondary | ICD-10-CM | POA: Diagnosis not present

## 2016-07-18 DIAGNOSIS — J069 Acute upper respiratory infection, unspecified: Secondary | ICD-10-CM | POA: Diagnosis not present

## 2016-07-19 DIAGNOSIS — J441 Chronic obstructive pulmonary disease with (acute) exacerbation: Secondary | ICD-10-CM | POA: Diagnosis not present

## 2016-07-19 DIAGNOSIS — J9610 Chronic respiratory failure, unspecified whether with hypoxia or hypercapnia: Secondary | ICD-10-CM | POA: Diagnosis not present

## 2016-07-21 DIAGNOSIS — E11622 Type 2 diabetes mellitus with other skin ulcer: Secondary | ICD-10-CM | POA: Diagnosis not present

## 2016-07-21 DIAGNOSIS — M1990 Unspecified osteoarthritis, unspecified site: Secondary | ICD-10-CM | POA: Diagnosis not present

## 2016-07-21 DIAGNOSIS — L97821 Non-pressure chronic ulcer of other part of left lower leg limited to breakdown of skin: Secondary | ICD-10-CM | POA: Diagnosis not present

## 2016-07-21 DIAGNOSIS — L97811 Non-pressure chronic ulcer of other part of right lower leg limited to breakdown of skin: Secondary | ICD-10-CM | POA: Diagnosis not present

## 2016-07-21 DIAGNOSIS — G4730 Sleep apnea, unspecified: Secondary | ICD-10-CM | POA: Diagnosis not present

## 2016-07-21 DIAGNOSIS — R569 Unspecified convulsions: Secondary | ICD-10-CM | POA: Diagnosis not present

## 2016-07-21 DIAGNOSIS — L97812 Non-pressure chronic ulcer of other part of right lower leg with fat layer exposed: Secondary | ICD-10-CM | POA: Diagnosis not present

## 2016-07-21 DIAGNOSIS — I872 Venous insufficiency (chronic) (peripheral): Secondary | ICD-10-CM | POA: Diagnosis not present

## 2016-07-21 DIAGNOSIS — I890 Lymphedema, not elsewhere classified: Secondary | ICD-10-CM | POA: Diagnosis not present

## 2016-07-21 DIAGNOSIS — L97822 Non-pressure chronic ulcer of other part of left lower leg with fat layer exposed: Secondary | ICD-10-CM | POA: Diagnosis not present

## 2016-07-21 DIAGNOSIS — I87313 Chronic venous hypertension (idiopathic) with ulcer of bilateral lower extremity: Secondary | ICD-10-CM | POA: Diagnosis not present

## 2016-07-21 DIAGNOSIS — I10 Essential (primary) hypertension: Secondary | ICD-10-CM | POA: Diagnosis not present

## 2016-07-26 DIAGNOSIS — Z7901 Long term (current) use of anticoagulants: Secondary | ICD-10-CM | POA: Diagnosis not present

## 2016-07-26 DIAGNOSIS — E11621 Type 2 diabetes mellitus with foot ulcer: Secondary | ICD-10-CM | POA: Diagnosis not present

## 2016-07-26 DIAGNOSIS — I10 Essential (primary) hypertension: Secondary | ICD-10-CM | POA: Diagnosis not present

## 2016-07-26 DIAGNOSIS — E782 Mixed hyperlipidemia: Secondary | ICD-10-CM | POA: Diagnosis not present

## 2016-07-26 DIAGNOSIS — E1149 Type 2 diabetes mellitus with other diabetic neurological complication: Secondary | ICD-10-CM | POA: Diagnosis not present

## 2016-07-28 DIAGNOSIS — I70203 Unspecified atherosclerosis of native arteries of extremities, bilateral legs: Secondary | ICD-10-CM | POA: Diagnosis not present

## 2016-07-28 DIAGNOSIS — L97219 Non-pressure chronic ulcer of right calf with unspecified severity: Secondary | ICD-10-CM | POA: Diagnosis not present

## 2016-07-28 DIAGNOSIS — L97229 Non-pressure chronic ulcer of left calf with unspecified severity: Secondary | ICD-10-CM | POA: Diagnosis not present

## 2016-07-28 DIAGNOSIS — I87323 Chronic venous hypertension (idiopathic) with inflammation of bilateral lower extremity: Secondary | ICD-10-CM | POA: Diagnosis not present

## 2016-07-28 DIAGNOSIS — M7989 Other specified soft tissue disorders: Secondary | ICD-10-CM | POA: Diagnosis not present

## 2016-07-29 DIAGNOSIS — L97821 Non-pressure chronic ulcer of other part of left lower leg limited to breakdown of skin: Secondary | ICD-10-CM | POA: Diagnosis not present

## 2016-07-29 DIAGNOSIS — L97812 Non-pressure chronic ulcer of other part of right lower leg with fat layer exposed: Secondary | ICD-10-CM | POA: Diagnosis not present

## 2016-07-29 DIAGNOSIS — I890 Lymphedema, not elsewhere classified: Secondary | ICD-10-CM | POA: Diagnosis not present

## 2016-07-29 DIAGNOSIS — L97822 Non-pressure chronic ulcer of other part of left lower leg with fat layer exposed: Secondary | ICD-10-CM | POA: Diagnosis not present

## 2016-07-29 DIAGNOSIS — I87313 Chronic venous hypertension (idiopathic) with ulcer of bilateral lower extremity: Secondary | ICD-10-CM | POA: Diagnosis not present

## 2016-07-29 DIAGNOSIS — I872 Venous insufficiency (chronic) (peripheral): Secondary | ICD-10-CM | POA: Diagnosis not present

## 2016-07-29 DIAGNOSIS — L97811 Non-pressure chronic ulcer of other part of right lower leg limited to breakdown of skin: Secondary | ICD-10-CM | POA: Diagnosis not present

## 2016-08-02 DIAGNOSIS — I87323 Chronic venous hypertension (idiopathic) with inflammation of bilateral lower extremity: Secondary | ICD-10-CM | POA: Diagnosis not present

## 2016-08-02 DIAGNOSIS — I890 Lymphedema, not elsewhere classified: Secondary | ICD-10-CM | POA: Diagnosis not present

## 2016-08-02 DIAGNOSIS — L97821 Non-pressure chronic ulcer of other part of left lower leg limited to breakdown of skin: Secondary | ICD-10-CM | POA: Diagnosis not present

## 2016-08-02 DIAGNOSIS — L97811 Non-pressure chronic ulcer of other part of right lower leg limited to breakdown of skin: Secondary | ICD-10-CM | POA: Diagnosis not present

## 2016-08-02 DIAGNOSIS — R600 Localized edema: Secondary | ICD-10-CM | POA: Diagnosis not present

## 2016-08-02 DIAGNOSIS — I872 Venous insufficiency (chronic) (peripheral): Secondary | ICD-10-CM | POA: Diagnosis not present

## 2016-08-05 DIAGNOSIS — I87313 Chronic venous hypertension (idiopathic) with ulcer of bilateral lower extremity: Secondary | ICD-10-CM | POA: Diagnosis not present

## 2016-08-05 DIAGNOSIS — L97821 Non-pressure chronic ulcer of other part of left lower leg limited to breakdown of skin: Secondary | ICD-10-CM | POA: Diagnosis not present

## 2016-08-05 DIAGNOSIS — I890 Lymphedema, not elsewhere classified: Secondary | ICD-10-CM | POA: Diagnosis not present

## 2016-08-05 DIAGNOSIS — L97812 Non-pressure chronic ulcer of other part of right lower leg with fat layer exposed: Secondary | ICD-10-CM | POA: Diagnosis not present

## 2016-08-05 DIAGNOSIS — I872 Venous insufficiency (chronic) (peripheral): Secondary | ICD-10-CM | POA: Diagnosis not present

## 2016-08-05 DIAGNOSIS — L97822 Non-pressure chronic ulcer of other part of left lower leg with fat layer exposed: Secondary | ICD-10-CM | POA: Diagnosis not present

## 2016-08-05 DIAGNOSIS — L97811 Non-pressure chronic ulcer of other part of right lower leg limited to breakdown of skin: Secondary | ICD-10-CM | POA: Diagnosis not present

## 2016-08-09 DIAGNOSIS — R21 Rash and other nonspecific skin eruption: Secondary | ICD-10-CM | POA: Diagnosis not present

## 2016-08-09 DIAGNOSIS — B350 Tinea barbae and tinea capitis: Secondary | ICD-10-CM | POA: Diagnosis not present

## 2016-08-12 DIAGNOSIS — L97812 Non-pressure chronic ulcer of other part of right lower leg with fat layer exposed: Secondary | ICD-10-CM | POA: Diagnosis not present

## 2016-08-12 DIAGNOSIS — I872 Venous insufficiency (chronic) (peripheral): Secondary | ICD-10-CM | POA: Diagnosis not present

## 2016-08-12 DIAGNOSIS — I87311 Chronic venous hypertension (idiopathic) with ulcer of right lower extremity: Secondary | ICD-10-CM | POA: Diagnosis not present

## 2016-08-12 DIAGNOSIS — Z7901 Long term (current) use of anticoagulants: Secondary | ICD-10-CM | POA: Diagnosis not present

## 2016-08-16 DIAGNOSIS — J9610 Chronic respiratory failure, unspecified whether with hypoxia or hypercapnia: Secondary | ICD-10-CM | POA: Diagnosis not present

## 2016-08-16 DIAGNOSIS — J441 Chronic obstructive pulmonary disease with (acute) exacerbation: Secondary | ICD-10-CM | POA: Diagnosis not present

## 2016-08-19 DIAGNOSIS — I87311 Chronic venous hypertension (idiopathic) with ulcer of right lower extremity: Secondary | ICD-10-CM | POA: Diagnosis not present

## 2016-08-19 DIAGNOSIS — L97811 Non-pressure chronic ulcer of other part of right lower leg limited to breakdown of skin: Secondary | ICD-10-CM | POA: Diagnosis not present

## 2016-08-19 DIAGNOSIS — L97812 Non-pressure chronic ulcer of other part of right lower leg with fat layer exposed: Secondary | ICD-10-CM | POA: Diagnosis not present

## 2016-08-19 DIAGNOSIS — I872 Venous insufficiency (chronic) (peripheral): Secondary | ICD-10-CM | POA: Diagnosis not present

## 2016-08-26 DIAGNOSIS — I872 Venous insufficiency (chronic) (peripheral): Secondary | ICD-10-CM | POA: Diagnosis not present

## 2016-08-26 DIAGNOSIS — L97821 Non-pressure chronic ulcer of other part of left lower leg limited to breakdown of skin: Secondary | ICD-10-CM | POA: Diagnosis not present

## 2016-08-26 DIAGNOSIS — L97822 Non-pressure chronic ulcer of other part of left lower leg with fat layer exposed: Secondary | ICD-10-CM | POA: Diagnosis not present

## 2016-08-26 DIAGNOSIS — I890 Lymphedema, not elsewhere classified: Secondary | ICD-10-CM | POA: Diagnosis not present

## 2016-08-26 DIAGNOSIS — I87312 Chronic venous hypertension (idiopathic) with ulcer of left lower extremity: Secondary | ICD-10-CM | POA: Diagnosis not present

## 2016-08-29 DIAGNOSIS — Z7901 Long term (current) use of anticoagulants: Secondary | ICD-10-CM | POA: Diagnosis not present

## 2016-09-02 DIAGNOSIS — I87313 Chronic venous hypertension (idiopathic) with ulcer of bilateral lower extremity: Secondary | ICD-10-CM | POA: Diagnosis not present

## 2016-09-02 DIAGNOSIS — L97819 Non-pressure chronic ulcer of other part of right lower leg with unspecified severity: Secondary | ICD-10-CM | POA: Diagnosis not present

## 2016-09-02 DIAGNOSIS — I890 Lymphedema, not elsewhere classified: Secondary | ICD-10-CM | POA: Diagnosis not present

## 2016-09-02 DIAGNOSIS — L309 Dermatitis, unspecified: Secondary | ICD-10-CM | POA: Diagnosis not present

## 2016-09-02 DIAGNOSIS — L97829 Non-pressure chronic ulcer of other part of left lower leg with unspecified severity: Secondary | ICD-10-CM | POA: Diagnosis not present

## 2016-09-09 DIAGNOSIS — I890 Lymphedema, not elsewhere classified: Secondary | ICD-10-CM | POA: Diagnosis not present

## 2016-09-09 DIAGNOSIS — I87312 Chronic venous hypertension (idiopathic) with ulcer of left lower extremity: Secondary | ICD-10-CM | POA: Diagnosis not present

## 2016-09-09 DIAGNOSIS — L308 Other specified dermatitis: Secondary | ICD-10-CM | POA: Diagnosis not present

## 2016-09-09 DIAGNOSIS — I87311 Chronic venous hypertension (idiopathic) with ulcer of right lower extremity: Secondary | ICD-10-CM | POA: Diagnosis not present

## 2016-09-09 DIAGNOSIS — I87303 Chronic venous hypertension (idiopathic) without complications of bilateral lower extremity: Secondary | ICD-10-CM | POA: Diagnosis not present

## 2016-09-16 DIAGNOSIS — J9610 Chronic respiratory failure, unspecified whether with hypoxia or hypercapnia: Secondary | ICD-10-CM | POA: Diagnosis not present

## 2016-09-16 DIAGNOSIS — J441 Chronic obstructive pulmonary disease with (acute) exacerbation: Secondary | ICD-10-CM | POA: Diagnosis not present

## 2016-09-19 DIAGNOSIS — I87313 Chronic venous hypertension (idiopathic) with ulcer of bilateral lower extremity: Secondary | ICD-10-CM | POA: Diagnosis not present

## 2016-09-19 DIAGNOSIS — L97819 Non-pressure chronic ulcer of other part of right lower leg with unspecified severity: Secondary | ICD-10-CM | POA: Diagnosis not present

## 2016-09-19 DIAGNOSIS — L97829 Non-pressure chronic ulcer of other part of left lower leg with unspecified severity: Secondary | ICD-10-CM | POA: Diagnosis not present

## 2016-09-19 DIAGNOSIS — Z09 Encounter for follow-up examination after completed treatment for conditions other than malignant neoplasm: Secondary | ICD-10-CM | POA: Diagnosis not present

## 2016-09-19 DIAGNOSIS — L309 Dermatitis, unspecified: Secondary | ICD-10-CM | POA: Diagnosis not present

## 2016-09-27 DIAGNOSIS — Z7901 Long term (current) use of anticoagulants: Secondary | ICD-10-CM | POA: Diagnosis not present

## 2016-10-12 DIAGNOSIS — I890 Lymphedema, not elsewhere classified: Secondary | ICD-10-CM | POA: Diagnosis not present

## 2016-10-16 DIAGNOSIS — J441 Chronic obstructive pulmonary disease with (acute) exacerbation: Secondary | ICD-10-CM | POA: Diagnosis not present

## 2016-10-16 DIAGNOSIS — J9610 Chronic respiratory failure, unspecified whether with hypoxia or hypercapnia: Secondary | ICD-10-CM | POA: Diagnosis not present

## 2016-10-25 DIAGNOSIS — W57XXXA Bitten or stung by nonvenomous insect and other nonvenomous arthropods, initial encounter: Secondary | ICD-10-CM | POA: Diagnosis not present

## 2016-10-25 DIAGNOSIS — A938 Other specified arthropod-borne viral fevers: Secondary | ICD-10-CM | POA: Diagnosis not present

## 2016-10-31 DIAGNOSIS — L03313 Cellulitis of chest wall: Secondary | ICD-10-CM | POA: Diagnosis not present

## 2016-10-31 DIAGNOSIS — W57XXXD Bitten or stung by nonvenomous insect and other nonvenomous arthropods, subsequent encounter: Secondary | ICD-10-CM | POA: Diagnosis not present

## 2016-10-31 DIAGNOSIS — Z205 Contact with and (suspected) exposure to viral hepatitis: Secondary | ICD-10-CM | POA: Diagnosis not present

## 2016-11-16 DIAGNOSIS — J9610 Chronic respiratory failure, unspecified whether with hypoxia or hypercapnia: Secondary | ICD-10-CM | POA: Diagnosis not present

## 2016-11-16 DIAGNOSIS — J441 Chronic obstructive pulmonary disease with (acute) exacerbation: Secondary | ICD-10-CM | POA: Diagnosis not present

## 2016-11-25 DIAGNOSIS — N183 Chronic kidney disease, stage 3 (moderate): Secondary | ICD-10-CM | POA: Diagnosis not present

## 2016-11-25 DIAGNOSIS — E1142 Type 2 diabetes mellitus with diabetic polyneuropathy: Secondary | ICD-10-CM | POA: Diagnosis not present

## 2016-11-25 DIAGNOSIS — E1151 Type 2 diabetes mellitus with diabetic peripheral angiopathy without gangrene: Secondary | ICD-10-CM | POA: Diagnosis not present

## 2016-11-25 DIAGNOSIS — E782 Mixed hyperlipidemia: Secondary | ICD-10-CM | POA: Diagnosis not present

## 2016-11-25 DIAGNOSIS — N400 Enlarged prostate without lower urinary tract symptoms: Secondary | ICD-10-CM | POA: Diagnosis not present

## 2016-11-25 DIAGNOSIS — G40A09 Absence epileptic syndrome, not intractable, without status epilepticus: Secondary | ICD-10-CM | POA: Diagnosis not present

## 2016-11-25 DIAGNOSIS — I10 Essential (primary) hypertension: Secondary | ICD-10-CM | POA: Diagnosis not present

## 2016-11-25 DIAGNOSIS — E1129 Type 2 diabetes mellitus with other diabetic kidney complication: Secondary | ICD-10-CM | POA: Diagnosis not present

## 2016-12-02 DIAGNOSIS — Z7901 Long term (current) use of anticoagulants: Secondary | ICD-10-CM | POA: Diagnosis not present

## 2016-12-09 DIAGNOSIS — E1143 Type 2 diabetes mellitus with diabetic autonomic (poly)neuropathy: Secondary | ICD-10-CM | POA: Diagnosis not present

## 2016-12-16 DIAGNOSIS — J441 Chronic obstructive pulmonary disease with (acute) exacerbation: Secondary | ICD-10-CM | POA: Diagnosis not present

## 2016-12-16 DIAGNOSIS — J9610 Chronic respiratory failure, unspecified whether with hypoxia or hypercapnia: Secondary | ICD-10-CM | POA: Diagnosis not present

## 2017-01-02 DIAGNOSIS — Z7901 Long term (current) use of anticoagulants: Secondary | ICD-10-CM | POA: Diagnosis not present

## 2017-01-06 DIAGNOSIS — I87319 Chronic venous hypertension (idiopathic) with ulcer of unspecified lower extremity: Secondary | ICD-10-CM | POA: Diagnosis not present

## 2017-01-06 DIAGNOSIS — E0843 Diabetes mellitus due to underlying condition with diabetic autonomic (poly)neuropathy: Secondary | ICD-10-CM | POA: Diagnosis not present

## 2017-01-06 DIAGNOSIS — J301 Allergic rhinitis due to pollen: Secondary | ICD-10-CM | POA: Diagnosis not present

## 2017-01-16 DIAGNOSIS — J441 Chronic obstructive pulmonary disease with (acute) exacerbation: Secondary | ICD-10-CM | POA: Diagnosis not present

## 2017-01-16 DIAGNOSIS — J9610 Chronic respiratory failure, unspecified whether with hypoxia or hypercapnia: Secondary | ICD-10-CM | POA: Diagnosis not present

## 2017-01-31 DIAGNOSIS — E119 Type 2 diabetes mellitus without complications: Secondary | ICD-10-CM | POA: Diagnosis not present

## 2017-01-31 DIAGNOSIS — H2513 Age-related nuclear cataract, bilateral: Secondary | ICD-10-CM | POA: Diagnosis not present

## 2017-02-02 DIAGNOSIS — J018 Other acute sinusitis: Secondary | ICD-10-CM | POA: Diagnosis not present

## 2017-02-16 DIAGNOSIS — J9610 Chronic respiratory failure, unspecified whether with hypoxia or hypercapnia: Secondary | ICD-10-CM | POA: Diagnosis not present

## 2017-02-16 DIAGNOSIS — J441 Chronic obstructive pulmonary disease with (acute) exacerbation: Secondary | ICD-10-CM | POA: Diagnosis not present

## 2017-03-02 DIAGNOSIS — Z7901 Long term (current) use of anticoagulants: Secondary | ICD-10-CM | POA: Diagnosis not present

## 2017-03-18 DIAGNOSIS — J441 Chronic obstructive pulmonary disease with (acute) exacerbation: Secondary | ICD-10-CM | POA: Diagnosis not present

## 2017-03-18 DIAGNOSIS — J9610 Chronic respiratory failure, unspecified whether with hypoxia or hypercapnia: Secondary | ICD-10-CM | POA: Diagnosis not present

## 2017-04-11 DIAGNOSIS — Z7901 Long term (current) use of anticoagulants: Secondary | ICD-10-CM | POA: Diagnosis not present

## 2017-04-18 DIAGNOSIS — J441 Chronic obstructive pulmonary disease with (acute) exacerbation: Secondary | ICD-10-CM | POA: Diagnosis not present

## 2017-04-18 DIAGNOSIS — J9610 Chronic respiratory failure, unspecified whether with hypoxia or hypercapnia: Secondary | ICD-10-CM | POA: Diagnosis not present

## 2017-05-10 DIAGNOSIS — Z7901 Long term (current) use of anticoagulants: Secondary | ICD-10-CM | POA: Diagnosis not present

## 2017-05-18 DIAGNOSIS — J9610 Chronic respiratory failure, unspecified whether with hypoxia or hypercapnia: Secondary | ICD-10-CM | POA: Diagnosis not present

## 2017-05-18 DIAGNOSIS — J441 Chronic obstructive pulmonary disease with (acute) exacerbation: Secondary | ICD-10-CM | POA: Diagnosis not present

## 2017-06-07 DIAGNOSIS — J0100 Acute maxillary sinusitis, unspecified: Secondary | ICD-10-CM | POA: Diagnosis not present

## 2017-06-07 DIAGNOSIS — J09X2 Influenza due to identified novel influenza A virus with other respiratory manifestations: Secondary | ICD-10-CM | POA: Diagnosis not present

## 2017-06-18 DIAGNOSIS — J441 Chronic obstructive pulmonary disease with (acute) exacerbation: Secondary | ICD-10-CM | POA: Diagnosis not present

## 2017-06-18 DIAGNOSIS — J9610 Chronic respiratory failure, unspecified whether with hypoxia or hypercapnia: Secondary | ICD-10-CM | POA: Diagnosis not present

## 2017-07-19 DIAGNOSIS — J9610 Chronic respiratory failure, unspecified whether with hypoxia or hypercapnia: Secondary | ICD-10-CM | POA: Diagnosis not present

## 2017-07-19 DIAGNOSIS — J441 Chronic obstructive pulmonary disease with (acute) exacerbation: Secondary | ICD-10-CM | POA: Diagnosis not present

## 2017-07-20 DIAGNOSIS — E782 Mixed hyperlipidemia: Secondary | ICD-10-CM | POA: Diagnosis not present

## 2017-07-20 DIAGNOSIS — Z7901 Long term (current) use of anticoagulants: Secondary | ICD-10-CM | POA: Diagnosis not present

## 2017-07-20 DIAGNOSIS — Z23 Encounter for immunization: Secondary | ICD-10-CM | POA: Diagnosis not present

## 2017-07-20 DIAGNOSIS — I10 Essential (primary) hypertension: Secondary | ICD-10-CM | POA: Diagnosis not present

## 2017-07-20 DIAGNOSIS — E1129 Type 2 diabetes mellitus with other diabetic kidney complication: Secondary | ICD-10-CM | POA: Diagnosis not present

## 2017-07-20 DIAGNOSIS — N400 Enlarged prostate without lower urinary tract symptoms: Secondary | ICD-10-CM | POA: Diagnosis not present

## 2017-07-20 DIAGNOSIS — G40319 Generalized idiopathic epilepsy and epileptic syndromes, intractable, without status epilepticus: Secondary | ICD-10-CM | POA: Diagnosis not present

## 2017-07-20 DIAGNOSIS — E1151 Type 2 diabetes mellitus with diabetic peripheral angiopathy without gangrene: Secondary | ICD-10-CM | POA: Diagnosis not present

## 2017-07-20 DIAGNOSIS — N183 Chronic kidney disease, stage 3 (moderate): Secondary | ICD-10-CM | POA: Diagnosis not present

## 2017-07-20 DIAGNOSIS — E1142 Type 2 diabetes mellitus with diabetic polyneuropathy: Secondary | ICD-10-CM | POA: Diagnosis not present

## 2017-07-20 LAB — MICROALBUMIN, URINE: Microalb, Ur: 150

## 2017-08-16 DIAGNOSIS — Z7901 Long term (current) use of anticoagulants: Secondary | ICD-10-CM | POA: Diagnosis not present

## 2017-09-19 DIAGNOSIS — Z7901 Long term (current) use of anticoagulants: Secondary | ICD-10-CM | POA: Diagnosis not present

## 2017-09-26 DIAGNOSIS — Z7901 Long term (current) use of anticoagulants: Secondary | ICD-10-CM | POA: Diagnosis not present

## 2017-11-20 DIAGNOSIS — E1129 Type 2 diabetes mellitus with other diabetic kidney complication: Secondary | ICD-10-CM | POA: Diagnosis not present

## 2017-11-20 DIAGNOSIS — R799 Abnormal finding of blood chemistry, unspecified: Secondary | ICD-10-CM | POA: Diagnosis not present

## 2017-11-20 DIAGNOSIS — Z7901 Long term (current) use of anticoagulants: Secondary | ICD-10-CM | POA: Diagnosis not present

## 2017-11-20 DIAGNOSIS — E1142 Type 2 diabetes mellitus with diabetic polyneuropathy: Secondary | ICD-10-CM | POA: Diagnosis not present

## 2017-11-20 DIAGNOSIS — N400 Enlarged prostate without lower urinary tract symptoms: Secondary | ICD-10-CM | POA: Diagnosis not present

## 2017-11-20 DIAGNOSIS — E1151 Type 2 diabetes mellitus with diabetic peripheral angiopathy without gangrene: Secondary | ICD-10-CM | POA: Diagnosis not present

## 2017-11-20 DIAGNOSIS — N183 Chronic kidney disease, stage 3 (moderate): Secondary | ICD-10-CM | POA: Diagnosis not present

## 2017-11-20 DIAGNOSIS — E782 Mixed hyperlipidemia: Secondary | ICD-10-CM | POA: Diagnosis not present

## 2017-11-20 DIAGNOSIS — I10 Essential (primary) hypertension: Secondary | ICD-10-CM | POA: Diagnosis not present

## 2017-11-20 DIAGNOSIS — G40319 Generalized idiopathic epilepsy and epileptic syndromes, intractable, without status epilepticus: Secondary | ICD-10-CM | POA: Diagnosis not present

## 2017-11-20 DIAGNOSIS — I87319 Chronic venous hypertension (idiopathic) with ulcer of unspecified lower extremity: Secondary | ICD-10-CM | POA: Diagnosis not present

## 2018-01-01 DIAGNOSIS — Z7901 Long term (current) use of anticoagulants: Secondary | ICD-10-CM | POA: Diagnosis not present

## 2018-01-17 DIAGNOSIS — J0100 Acute maxillary sinusitis, unspecified: Secondary | ICD-10-CM | POA: Diagnosis not present

## 2018-01-22 DIAGNOSIS — I252 Old myocardial infarction: Secondary | ICD-10-CM | POA: Insufficient documentation

## 2018-01-22 DIAGNOSIS — M79661 Pain in right lower leg: Secondary | ICD-10-CM | POA: Diagnosis not present

## 2018-01-22 DIAGNOSIS — R791 Abnormal coagulation profile: Secondary | ICD-10-CM | POA: Diagnosis not present

## 2018-01-22 DIAGNOSIS — N200 Calculus of kidney: Secondary | ICD-10-CM | POA: Diagnosis not present

## 2018-01-22 DIAGNOSIS — E119 Type 2 diabetes mellitus without complications: Secondary | ICD-10-CM | POA: Diagnosis not present

## 2018-01-22 DIAGNOSIS — Z87891 Personal history of nicotine dependence: Secondary | ICD-10-CM | POA: Diagnosis not present

## 2018-01-22 DIAGNOSIS — I10 Essential (primary) hypertension: Secondary | ICD-10-CM | POA: Insufficient documentation

## 2018-01-22 DIAGNOSIS — N179 Acute kidney failure, unspecified: Secondary | ICD-10-CM | POA: Diagnosis not present

## 2018-01-22 DIAGNOSIS — R2241 Localized swelling, mass and lump, right lower limb: Secondary | ICD-10-CM | POA: Diagnosis not present

## 2018-01-22 DIAGNOSIS — Z8673 Personal history of transient ischemic attack (TIA), and cerebral infarction without residual deficits: Secondary | ICD-10-CM

## 2018-01-22 DIAGNOSIS — Z86718 Personal history of other venous thrombosis and embolism: Secondary | ICD-10-CM | POA: Insufficient documentation

## 2018-01-22 DIAGNOSIS — R52 Pain, unspecified: Secondary | ICD-10-CM | POA: Diagnosis not present

## 2018-01-22 DIAGNOSIS — E669 Obesity, unspecified: Secondary | ICD-10-CM | POA: Diagnosis not present

## 2018-01-22 DIAGNOSIS — E1151 Type 2 diabetes mellitus with diabetic peripheral angiopathy without gangrene: Secondary | ICD-10-CM | POA: Diagnosis not present

## 2018-01-22 DIAGNOSIS — R531 Weakness: Secondary | ICD-10-CM | POA: Diagnosis not present

## 2018-01-22 DIAGNOSIS — N189 Chronic kidney disease, unspecified: Secondary | ICD-10-CM | POA: Diagnosis not present

## 2018-01-22 DIAGNOSIS — R05 Cough: Secondary | ICD-10-CM | POA: Diagnosis not present

## 2018-01-22 DIAGNOSIS — G40909 Epilepsy, unspecified, not intractable, without status epilepticus: Secondary | ICD-10-CM | POA: Diagnosis not present

## 2018-01-22 DIAGNOSIS — R609 Edema, unspecified: Secondary | ICD-10-CM | POA: Diagnosis not present

## 2018-01-22 DIAGNOSIS — I129 Hypertensive chronic kidney disease with stage 1 through stage 4 chronic kidney disease, or unspecified chronic kidney disease: Secondary | ICD-10-CM | POA: Diagnosis not present

## 2018-01-22 DIAGNOSIS — I890 Lymphedema, not elsewhere classified: Secondary | ICD-10-CM | POA: Insufficient documentation

## 2018-01-22 DIAGNOSIS — M79662 Pain in left lower leg: Secondary | ICD-10-CM | POA: Diagnosis not present

## 2018-01-22 DIAGNOSIS — E785 Hyperlipidemia, unspecified: Secondary | ICD-10-CM | POA: Diagnosis not present

## 2018-01-22 DIAGNOSIS — Z6841 Body Mass Index (BMI) 40.0 and over, adult: Secondary | ICD-10-CM | POA: Diagnosis not present

## 2018-01-22 DIAGNOSIS — R3129 Other microscopic hematuria: Secondary | ICD-10-CM | POA: Diagnosis not present

## 2018-01-22 DIAGNOSIS — E1122 Type 2 diabetes mellitus with diabetic chronic kidney disease: Secondary | ICD-10-CM | POA: Diagnosis not present

## 2018-01-22 DIAGNOSIS — R2242 Localized swelling, mass and lump, left lower limb: Secondary | ICD-10-CM | POA: Diagnosis not present

## 2018-01-22 HISTORY — DX: Personal history of other venous thrombosis and embolism: Z86.718

## 2018-01-22 HISTORY — DX: Old myocardial infarction: I25.2

## 2018-01-22 HISTORY — DX: Lymphedema, not elsewhere classified: I89.0

## 2018-01-22 HISTORY — DX: Personal history of transient ischemic attack (TIA), and cerebral infarction without residual deficits: Z86.73

## 2018-01-22 HISTORY — DX: Essential (primary) hypertension: I10

## 2018-01-23 DIAGNOSIS — N179 Acute kidney failure, unspecified: Secondary | ICD-10-CM | POA: Diagnosis not present

## 2018-01-23 DIAGNOSIS — R791 Abnormal coagulation profile: Secondary | ICD-10-CM | POA: Diagnosis not present

## 2018-01-23 DIAGNOSIS — N189 Chronic kidney disease, unspecified: Secondary | ICD-10-CM | POA: Diagnosis not present

## 2018-01-24 DIAGNOSIS — N179 Acute kidney failure, unspecified: Secondary | ICD-10-CM | POA: Diagnosis not present

## 2018-01-24 DIAGNOSIS — N189 Chronic kidney disease, unspecified: Secondary | ICD-10-CM | POA: Diagnosis not present

## 2018-01-24 DIAGNOSIS — R791 Abnormal coagulation profile: Secondary | ICD-10-CM | POA: Diagnosis not present

## 2018-01-27 DIAGNOSIS — Z87891 Personal history of nicotine dependence: Secondary | ICD-10-CM | POA: Diagnosis not present

## 2018-01-27 DIAGNOSIS — L989 Disorder of the skin and subcutaneous tissue, unspecified: Secondary | ICD-10-CM | POA: Diagnosis not present

## 2018-01-27 DIAGNOSIS — E119 Type 2 diabetes mellitus without complications: Secondary | ICD-10-CM | POA: Diagnosis not present

## 2018-01-27 DIAGNOSIS — Z7901 Long term (current) use of anticoagulants: Secondary | ICD-10-CM | POA: Diagnosis not present

## 2018-01-27 DIAGNOSIS — Z86718 Personal history of other venous thrombosis and embolism: Secondary | ICD-10-CM | POA: Diagnosis not present

## 2018-01-27 DIAGNOSIS — I890 Lymphedema, not elsewhere classified: Secondary | ICD-10-CM | POA: Diagnosis not present

## 2018-01-27 DIAGNOSIS — I252 Old myocardial infarction: Secondary | ICD-10-CM | POA: Diagnosis not present

## 2018-01-27 DIAGNOSIS — M19011 Primary osteoarthritis, right shoulder: Secondary | ICD-10-CM | POA: Diagnosis not present

## 2018-01-27 DIAGNOSIS — Z7984 Long term (current) use of oral hypoglycemic drugs: Secondary | ICD-10-CM | POA: Diagnosis not present

## 2018-01-27 DIAGNOSIS — G40909 Epilepsy, unspecified, not intractable, without status epilepticus: Secondary | ICD-10-CM | POA: Diagnosis not present

## 2018-01-27 DIAGNOSIS — G40319 Generalized idiopathic epilepsy and epileptic syndromes, intractable, without status epilepticus: Secondary | ICD-10-CM | POA: Diagnosis not present

## 2018-01-27 DIAGNOSIS — N200 Calculus of kidney: Secondary | ICD-10-CM | POA: Diagnosis not present

## 2018-01-27 DIAGNOSIS — Z602 Problems related to living alone: Secondary | ICD-10-CM | POA: Diagnosis not present

## 2018-02-01 DIAGNOSIS — Z7901 Long term (current) use of anticoagulants: Secondary | ICD-10-CM | POA: Diagnosis not present

## 2018-02-01 DIAGNOSIS — I890 Lymphedema, not elsewhere classified: Secondary | ICD-10-CM | POA: Diagnosis not present

## 2018-02-01 DIAGNOSIS — N179 Acute kidney failure, unspecified: Secondary | ICD-10-CM | POA: Diagnosis not present

## 2018-02-01 DIAGNOSIS — I10 Essential (primary) hypertension: Secondary | ICD-10-CM | POA: Diagnosis not present

## 2018-02-02 DIAGNOSIS — E119 Type 2 diabetes mellitus without complications: Secondary | ICD-10-CM | POA: Diagnosis not present

## 2018-02-02 DIAGNOSIS — Z7984 Long term (current) use of oral hypoglycemic drugs: Secondary | ICD-10-CM | POA: Diagnosis not present

## 2018-02-02 DIAGNOSIS — L989 Disorder of the skin and subcutaneous tissue, unspecified: Secondary | ICD-10-CM | POA: Diagnosis not present

## 2018-02-02 DIAGNOSIS — I890 Lymphedema, not elsewhere classified: Secondary | ICD-10-CM | POA: Diagnosis not present

## 2018-02-02 DIAGNOSIS — Z7901 Long term (current) use of anticoagulants: Secondary | ICD-10-CM | POA: Diagnosis not present

## 2018-02-02 DIAGNOSIS — Z86718 Personal history of other venous thrombosis and embolism: Secondary | ICD-10-CM | POA: Diagnosis not present

## 2018-02-02 DIAGNOSIS — M19011 Primary osteoarthritis, right shoulder: Secondary | ICD-10-CM | POA: Diagnosis not present

## 2018-02-02 DIAGNOSIS — G40319 Generalized idiopathic epilepsy and epileptic syndromes, intractable, without status epilepticus: Secondary | ICD-10-CM | POA: Diagnosis not present

## 2018-02-02 DIAGNOSIS — Z87891 Personal history of nicotine dependence: Secondary | ICD-10-CM | POA: Diagnosis not present

## 2018-02-02 DIAGNOSIS — Z602 Problems related to living alone: Secondary | ICD-10-CM | POA: Diagnosis not present

## 2018-02-02 DIAGNOSIS — I252 Old myocardial infarction: Secondary | ICD-10-CM | POA: Diagnosis not present

## 2018-02-05 DIAGNOSIS — G40319 Generalized idiopathic epilepsy and epileptic syndromes, intractable, without status epilepticus: Secondary | ICD-10-CM | POA: Diagnosis not present

## 2018-02-05 DIAGNOSIS — Z7984 Long term (current) use of oral hypoglycemic drugs: Secondary | ICD-10-CM | POA: Diagnosis not present

## 2018-02-05 DIAGNOSIS — Z86718 Personal history of other venous thrombosis and embolism: Secondary | ICD-10-CM | POA: Diagnosis not present

## 2018-02-05 DIAGNOSIS — Z87891 Personal history of nicotine dependence: Secondary | ICD-10-CM | POA: Diagnosis not present

## 2018-02-05 DIAGNOSIS — I252 Old myocardial infarction: Secondary | ICD-10-CM | POA: Diagnosis not present

## 2018-02-05 DIAGNOSIS — Z602 Problems related to living alone: Secondary | ICD-10-CM | POA: Diagnosis not present

## 2018-02-05 DIAGNOSIS — M19011 Primary osteoarthritis, right shoulder: Secondary | ICD-10-CM | POA: Diagnosis not present

## 2018-02-05 DIAGNOSIS — Z7901 Long term (current) use of anticoagulants: Secondary | ICD-10-CM | POA: Diagnosis not present

## 2018-02-05 DIAGNOSIS — I890 Lymphedema, not elsewhere classified: Secondary | ICD-10-CM | POA: Diagnosis not present

## 2018-02-12 DIAGNOSIS — Z87891 Personal history of nicotine dependence: Secondary | ICD-10-CM | POA: Diagnosis not present

## 2018-02-12 DIAGNOSIS — Z7901 Long term (current) use of anticoagulants: Secondary | ICD-10-CM | POA: Diagnosis not present

## 2018-02-12 DIAGNOSIS — I252 Old myocardial infarction: Secondary | ICD-10-CM | POA: Diagnosis not present

## 2018-02-12 DIAGNOSIS — Z602 Problems related to living alone: Secondary | ICD-10-CM | POA: Diagnosis not present

## 2018-02-12 DIAGNOSIS — Z86718 Personal history of other venous thrombosis and embolism: Secondary | ICD-10-CM | POA: Diagnosis not present

## 2018-02-12 DIAGNOSIS — Z7984 Long term (current) use of oral hypoglycemic drugs: Secondary | ICD-10-CM | POA: Diagnosis not present

## 2018-02-12 DIAGNOSIS — G40319 Generalized idiopathic epilepsy and epileptic syndromes, intractable, without status epilepticus: Secondary | ICD-10-CM | POA: Diagnosis not present

## 2018-02-12 DIAGNOSIS — I890 Lymphedema, not elsewhere classified: Secondary | ICD-10-CM | POA: Diagnosis not present

## 2018-02-12 DIAGNOSIS — M19011 Primary osteoarthritis, right shoulder: Secondary | ICD-10-CM | POA: Diagnosis not present

## 2018-02-16 DIAGNOSIS — I252 Old myocardial infarction: Secondary | ICD-10-CM | POA: Diagnosis not present

## 2018-02-16 DIAGNOSIS — N179 Acute kidney failure, unspecified: Secondary | ICD-10-CM | POA: Diagnosis not present

## 2018-02-16 DIAGNOSIS — E1129 Type 2 diabetes mellitus with other diabetic kidney complication: Secondary | ICD-10-CM | POA: Diagnosis not present

## 2018-02-16 DIAGNOSIS — Z7901 Long term (current) use of anticoagulants: Secondary | ICD-10-CM | POA: Diagnosis not present

## 2018-02-16 DIAGNOSIS — M19011 Primary osteoarthritis, right shoulder: Secondary | ICD-10-CM | POA: Diagnosis not present

## 2018-02-16 DIAGNOSIS — I890 Lymphedema, not elsewhere classified: Secondary | ICD-10-CM | POA: Diagnosis not present

## 2018-02-16 DIAGNOSIS — Z7984 Long term (current) use of oral hypoglycemic drugs: Secondary | ICD-10-CM | POA: Diagnosis not present

## 2018-02-16 DIAGNOSIS — G40319 Generalized idiopathic epilepsy and epileptic syndromes, intractable, without status epilepticus: Secondary | ICD-10-CM | POA: Diagnosis not present

## 2018-02-16 DIAGNOSIS — Z87891 Personal history of nicotine dependence: Secondary | ICD-10-CM | POA: Diagnosis not present

## 2018-02-16 DIAGNOSIS — Z86718 Personal history of other venous thrombosis and embolism: Secondary | ICD-10-CM | POA: Diagnosis not present

## 2018-02-16 DIAGNOSIS — Z602 Problems related to living alone: Secondary | ICD-10-CM | POA: Diagnosis not present

## 2018-02-16 DIAGNOSIS — I10 Essential (primary) hypertension: Secondary | ICD-10-CM | POA: Diagnosis not present

## 2018-02-19 DIAGNOSIS — Z7901 Long term (current) use of anticoagulants: Secondary | ICD-10-CM | POA: Diagnosis not present

## 2018-02-19 DIAGNOSIS — Z7984 Long term (current) use of oral hypoglycemic drugs: Secondary | ICD-10-CM | POA: Diagnosis not present

## 2018-02-19 DIAGNOSIS — G40319 Generalized idiopathic epilepsy and epileptic syndromes, intractable, without status epilepticus: Secondary | ICD-10-CM | POA: Diagnosis not present

## 2018-02-19 DIAGNOSIS — Z87891 Personal history of nicotine dependence: Secondary | ICD-10-CM | POA: Diagnosis not present

## 2018-02-19 DIAGNOSIS — I890 Lymphedema, not elsewhere classified: Secondary | ICD-10-CM | POA: Diagnosis not present

## 2018-02-19 DIAGNOSIS — Z602 Problems related to living alone: Secondary | ICD-10-CM | POA: Diagnosis not present

## 2018-02-19 DIAGNOSIS — Z86718 Personal history of other venous thrombosis and embolism: Secondary | ICD-10-CM | POA: Diagnosis not present

## 2018-02-19 DIAGNOSIS — M19011 Primary osteoarthritis, right shoulder: Secondary | ICD-10-CM | POA: Diagnosis not present

## 2018-02-19 DIAGNOSIS — I252 Old myocardial infarction: Secondary | ICD-10-CM | POA: Diagnosis not present

## 2018-02-23 DIAGNOSIS — Z7984 Long term (current) use of oral hypoglycemic drugs: Secondary | ICD-10-CM | POA: Diagnosis not present

## 2018-02-23 DIAGNOSIS — I890 Lymphedema, not elsewhere classified: Secondary | ICD-10-CM | POA: Diagnosis not present

## 2018-02-23 DIAGNOSIS — Z87891 Personal history of nicotine dependence: Secondary | ICD-10-CM | POA: Diagnosis not present

## 2018-02-23 DIAGNOSIS — G40319 Generalized idiopathic epilepsy and epileptic syndromes, intractable, without status epilepticus: Secondary | ICD-10-CM | POA: Diagnosis not present

## 2018-02-23 DIAGNOSIS — Z7901 Long term (current) use of anticoagulants: Secondary | ICD-10-CM | POA: Diagnosis not present

## 2018-02-23 DIAGNOSIS — I252 Old myocardial infarction: Secondary | ICD-10-CM | POA: Diagnosis not present

## 2018-02-23 DIAGNOSIS — M19011 Primary osteoarthritis, right shoulder: Secondary | ICD-10-CM | POA: Diagnosis not present

## 2018-02-23 DIAGNOSIS — Z86718 Personal history of other venous thrombosis and embolism: Secondary | ICD-10-CM | POA: Diagnosis not present

## 2018-02-23 DIAGNOSIS — Z602 Problems related to living alone: Secondary | ICD-10-CM | POA: Diagnosis not present

## 2018-02-26 DIAGNOSIS — Z87891 Personal history of nicotine dependence: Secondary | ICD-10-CM | POA: Diagnosis not present

## 2018-02-26 DIAGNOSIS — Z86718 Personal history of other venous thrombosis and embolism: Secondary | ICD-10-CM | POA: Diagnosis not present

## 2018-02-26 DIAGNOSIS — I890 Lymphedema, not elsewhere classified: Secondary | ICD-10-CM | POA: Diagnosis not present

## 2018-02-26 DIAGNOSIS — G40319 Generalized idiopathic epilepsy and epileptic syndromes, intractable, without status epilepticus: Secondary | ICD-10-CM | POA: Diagnosis not present

## 2018-02-26 DIAGNOSIS — M19011 Primary osteoarthritis, right shoulder: Secondary | ICD-10-CM | POA: Diagnosis not present

## 2018-02-26 DIAGNOSIS — I252 Old myocardial infarction: Secondary | ICD-10-CM | POA: Diagnosis not present

## 2018-02-26 DIAGNOSIS — Z7901 Long term (current) use of anticoagulants: Secondary | ICD-10-CM | POA: Diagnosis not present

## 2018-02-26 DIAGNOSIS — Z7984 Long term (current) use of oral hypoglycemic drugs: Secondary | ICD-10-CM | POA: Diagnosis not present

## 2018-02-26 DIAGNOSIS — Z602 Problems related to living alone: Secondary | ICD-10-CM | POA: Diagnosis not present

## 2018-03-01 DIAGNOSIS — Z7984 Long term (current) use of oral hypoglycemic drugs: Secondary | ICD-10-CM | POA: Diagnosis not present

## 2018-03-01 DIAGNOSIS — G40319 Generalized idiopathic epilepsy and epileptic syndromes, intractable, without status epilepticus: Secondary | ICD-10-CM | POA: Diagnosis not present

## 2018-03-01 DIAGNOSIS — I252 Old myocardial infarction: Secondary | ICD-10-CM | POA: Diagnosis not present

## 2018-03-01 DIAGNOSIS — I890 Lymphedema, not elsewhere classified: Secondary | ICD-10-CM | POA: Diagnosis not present

## 2018-03-01 DIAGNOSIS — Z87891 Personal history of nicotine dependence: Secondary | ICD-10-CM | POA: Diagnosis not present

## 2018-03-01 DIAGNOSIS — Z86718 Personal history of other venous thrombosis and embolism: Secondary | ICD-10-CM | POA: Diagnosis not present

## 2018-03-01 DIAGNOSIS — M19011 Primary osteoarthritis, right shoulder: Secondary | ICD-10-CM | POA: Diagnosis not present

## 2018-03-01 DIAGNOSIS — Z7901 Long term (current) use of anticoagulants: Secondary | ICD-10-CM | POA: Diagnosis not present

## 2018-03-01 DIAGNOSIS — Z602 Problems related to living alone: Secondary | ICD-10-CM | POA: Diagnosis not present

## 2018-03-05 DIAGNOSIS — Z86718 Personal history of other venous thrombosis and embolism: Secondary | ICD-10-CM | POA: Diagnosis not present

## 2018-03-05 DIAGNOSIS — G40319 Generalized idiopathic epilepsy and epileptic syndromes, intractable, without status epilepticus: Secondary | ICD-10-CM | POA: Diagnosis not present

## 2018-03-05 DIAGNOSIS — Z7984 Long term (current) use of oral hypoglycemic drugs: Secondary | ICD-10-CM | POA: Diagnosis not present

## 2018-03-05 DIAGNOSIS — I890 Lymphedema, not elsewhere classified: Secondary | ICD-10-CM | POA: Diagnosis not present

## 2018-03-05 DIAGNOSIS — Z602 Problems related to living alone: Secondary | ICD-10-CM | POA: Diagnosis not present

## 2018-03-05 DIAGNOSIS — M19011 Primary osteoarthritis, right shoulder: Secondary | ICD-10-CM | POA: Diagnosis not present

## 2018-03-05 DIAGNOSIS — Z87891 Personal history of nicotine dependence: Secondary | ICD-10-CM | POA: Diagnosis not present

## 2018-03-05 DIAGNOSIS — I252 Old myocardial infarction: Secondary | ICD-10-CM | POA: Diagnosis not present

## 2018-03-05 DIAGNOSIS — Z7901 Long term (current) use of anticoagulants: Secondary | ICD-10-CM | POA: Diagnosis not present

## 2018-03-09 DIAGNOSIS — Z602 Problems related to living alone: Secondary | ICD-10-CM | POA: Diagnosis not present

## 2018-03-09 DIAGNOSIS — G40319 Generalized idiopathic epilepsy and epileptic syndromes, intractable, without status epilepticus: Secondary | ICD-10-CM | POA: Diagnosis not present

## 2018-03-09 DIAGNOSIS — I252 Old myocardial infarction: Secondary | ICD-10-CM | POA: Diagnosis not present

## 2018-03-09 DIAGNOSIS — Z86718 Personal history of other venous thrombosis and embolism: Secondary | ICD-10-CM | POA: Diagnosis not present

## 2018-03-09 DIAGNOSIS — Z7984 Long term (current) use of oral hypoglycemic drugs: Secondary | ICD-10-CM | POA: Diagnosis not present

## 2018-03-09 DIAGNOSIS — Z7901 Long term (current) use of anticoagulants: Secondary | ICD-10-CM | POA: Diagnosis not present

## 2018-03-09 DIAGNOSIS — Z87891 Personal history of nicotine dependence: Secondary | ICD-10-CM | POA: Diagnosis not present

## 2018-03-09 DIAGNOSIS — I890 Lymphedema, not elsewhere classified: Secondary | ICD-10-CM | POA: Diagnosis not present

## 2018-03-09 DIAGNOSIS — M19011 Primary osteoarthritis, right shoulder: Secondary | ICD-10-CM | POA: Diagnosis not present

## 2018-03-12 DIAGNOSIS — Z87891 Personal history of nicotine dependence: Secondary | ICD-10-CM | POA: Diagnosis not present

## 2018-03-12 DIAGNOSIS — Z7901 Long term (current) use of anticoagulants: Secondary | ICD-10-CM | POA: Diagnosis not present

## 2018-03-12 DIAGNOSIS — G40319 Generalized idiopathic epilepsy and epileptic syndromes, intractable, without status epilepticus: Secondary | ICD-10-CM | POA: Diagnosis not present

## 2018-03-12 DIAGNOSIS — I890 Lymphedema, not elsewhere classified: Secondary | ICD-10-CM | POA: Diagnosis not present

## 2018-03-12 DIAGNOSIS — I252 Old myocardial infarction: Secondary | ICD-10-CM | POA: Diagnosis not present

## 2018-03-12 DIAGNOSIS — M19011 Primary osteoarthritis, right shoulder: Secondary | ICD-10-CM | POA: Diagnosis not present

## 2018-03-12 DIAGNOSIS — Z602 Problems related to living alone: Secondary | ICD-10-CM | POA: Diagnosis not present

## 2018-03-12 DIAGNOSIS — Z86718 Personal history of other venous thrombosis and embolism: Secondary | ICD-10-CM | POA: Diagnosis not present

## 2018-03-12 DIAGNOSIS — Z7984 Long term (current) use of oral hypoglycemic drugs: Secondary | ICD-10-CM | POA: Diagnosis not present

## 2018-03-15 DIAGNOSIS — Z7901 Long term (current) use of anticoagulants: Secondary | ICD-10-CM | POA: Diagnosis not present

## 2018-03-15 DIAGNOSIS — M19011 Primary osteoarthritis, right shoulder: Secondary | ICD-10-CM | POA: Diagnosis not present

## 2018-03-15 DIAGNOSIS — G40319 Generalized idiopathic epilepsy and epileptic syndromes, intractable, without status epilepticus: Secondary | ICD-10-CM | POA: Diagnosis not present

## 2018-03-15 DIAGNOSIS — Z602 Problems related to living alone: Secondary | ICD-10-CM | POA: Diagnosis not present

## 2018-03-15 DIAGNOSIS — I890 Lymphedema, not elsewhere classified: Secondary | ICD-10-CM | POA: Diagnosis not present

## 2018-03-15 DIAGNOSIS — Z86718 Personal history of other venous thrombosis and embolism: Secondary | ICD-10-CM | POA: Diagnosis not present

## 2018-03-15 DIAGNOSIS — I252 Old myocardial infarction: Secondary | ICD-10-CM | POA: Diagnosis not present

## 2018-03-15 DIAGNOSIS — Z87891 Personal history of nicotine dependence: Secondary | ICD-10-CM | POA: Diagnosis not present

## 2018-03-15 DIAGNOSIS — Z7984 Long term (current) use of oral hypoglycemic drugs: Secondary | ICD-10-CM | POA: Diagnosis not present

## 2018-03-19 DIAGNOSIS — Z86718 Personal history of other venous thrombosis and embolism: Secondary | ICD-10-CM | POA: Diagnosis not present

## 2018-03-19 DIAGNOSIS — I252 Old myocardial infarction: Secondary | ICD-10-CM | POA: Diagnosis not present

## 2018-03-19 DIAGNOSIS — M19011 Primary osteoarthritis, right shoulder: Secondary | ICD-10-CM | POA: Diagnosis not present

## 2018-03-19 DIAGNOSIS — Z87891 Personal history of nicotine dependence: Secondary | ICD-10-CM | POA: Diagnosis not present

## 2018-03-19 DIAGNOSIS — Z602 Problems related to living alone: Secondary | ICD-10-CM | POA: Diagnosis not present

## 2018-03-19 DIAGNOSIS — G40319 Generalized idiopathic epilepsy and epileptic syndromes, intractable, without status epilepticus: Secondary | ICD-10-CM | POA: Diagnosis not present

## 2018-03-19 DIAGNOSIS — Z7984 Long term (current) use of oral hypoglycemic drugs: Secondary | ICD-10-CM | POA: Diagnosis not present

## 2018-03-19 DIAGNOSIS — Z7901 Long term (current) use of anticoagulants: Secondary | ICD-10-CM | POA: Diagnosis not present

## 2018-03-19 DIAGNOSIS — I890 Lymphedema, not elsewhere classified: Secondary | ICD-10-CM | POA: Diagnosis not present

## 2018-03-23 DIAGNOSIS — I890 Lymphedema, not elsewhere classified: Secondary | ICD-10-CM | POA: Diagnosis not present

## 2018-03-23 DIAGNOSIS — M19011 Primary osteoarthritis, right shoulder: Secondary | ICD-10-CM | POA: Diagnosis not present

## 2018-03-23 DIAGNOSIS — G40319 Generalized idiopathic epilepsy and epileptic syndromes, intractable, without status epilepticus: Secondary | ICD-10-CM | POA: Diagnosis not present

## 2018-03-23 DIAGNOSIS — I252 Old myocardial infarction: Secondary | ICD-10-CM | POA: Diagnosis not present

## 2018-03-23 DIAGNOSIS — Z7901 Long term (current) use of anticoagulants: Secondary | ICD-10-CM | POA: Diagnosis not present

## 2018-03-23 DIAGNOSIS — Z602 Problems related to living alone: Secondary | ICD-10-CM | POA: Diagnosis not present

## 2018-03-23 DIAGNOSIS — Z87891 Personal history of nicotine dependence: Secondary | ICD-10-CM | POA: Diagnosis not present

## 2018-03-23 DIAGNOSIS — Z7984 Long term (current) use of oral hypoglycemic drugs: Secondary | ICD-10-CM | POA: Diagnosis not present

## 2018-03-23 DIAGNOSIS — Z86718 Personal history of other venous thrombosis and embolism: Secondary | ICD-10-CM | POA: Diagnosis not present

## 2018-03-26 DIAGNOSIS — G40319 Generalized idiopathic epilepsy and epileptic syndromes, intractable, without status epilepticus: Secondary | ICD-10-CM | POA: Diagnosis not present

## 2018-03-26 DIAGNOSIS — M19011 Primary osteoarthritis, right shoulder: Secondary | ICD-10-CM | POA: Diagnosis not present

## 2018-03-26 DIAGNOSIS — Z602 Problems related to living alone: Secondary | ICD-10-CM | POA: Diagnosis not present

## 2018-03-26 DIAGNOSIS — Z86718 Personal history of other venous thrombosis and embolism: Secondary | ICD-10-CM | POA: Diagnosis not present

## 2018-03-26 DIAGNOSIS — I890 Lymphedema, not elsewhere classified: Secondary | ICD-10-CM | POA: Diagnosis not present

## 2018-03-26 DIAGNOSIS — Z7984 Long term (current) use of oral hypoglycemic drugs: Secondary | ICD-10-CM | POA: Diagnosis not present

## 2018-03-26 DIAGNOSIS — I252 Old myocardial infarction: Secondary | ICD-10-CM | POA: Diagnosis not present

## 2018-03-26 DIAGNOSIS — Z7901 Long term (current) use of anticoagulants: Secondary | ICD-10-CM | POA: Diagnosis not present

## 2018-03-26 DIAGNOSIS — Z87891 Personal history of nicotine dependence: Secondary | ICD-10-CM | POA: Diagnosis not present

## 2018-04-03 DIAGNOSIS — Z7901 Long term (current) use of anticoagulants: Secondary | ICD-10-CM | POA: Diagnosis not present

## 2018-04-04 DIAGNOSIS — I87311 Chronic venous hypertension (idiopathic) with ulcer of right lower extremity: Secondary | ICD-10-CM | POA: Diagnosis not present

## 2018-04-04 DIAGNOSIS — E782 Mixed hyperlipidemia: Secondary | ICD-10-CM | POA: Diagnosis not present

## 2018-04-04 DIAGNOSIS — E1142 Type 2 diabetes mellitus with diabetic polyneuropathy: Secondary | ICD-10-CM | POA: Diagnosis not present

## 2018-04-04 DIAGNOSIS — I2510 Atherosclerotic heart disease of native coronary artery without angina pectoris: Secondary | ICD-10-CM | POA: Diagnosis not present

## 2018-04-04 DIAGNOSIS — I10 Essential (primary) hypertension: Secondary | ICD-10-CM | POA: Diagnosis not present

## 2018-04-04 DIAGNOSIS — N183 Chronic kidney disease, stage 3 (moderate): Secondary | ICD-10-CM | POA: Diagnosis not present

## 2018-04-04 DIAGNOSIS — Z7901 Long term (current) use of anticoagulants: Secondary | ICD-10-CM | POA: Diagnosis not present

## 2018-04-04 DIAGNOSIS — E1122 Type 2 diabetes mellitus with diabetic chronic kidney disease: Secondary | ICD-10-CM | POA: Diagnosis not present

## 2018-04-04 DIAGNOSIS — G40319 Generalized idiopathic epilepsy and epileptic syndromes, intractable, without status epilepticus: Secondary | ICD-10-CM | POA: Diagnosis not present

## 2018-04-11 DIAGNOSIS — L97829 Non-pressure chronic ulcer of other part of left lower leg with unspecified severity: Secondary | ICD-10-CM | POA: Diagnosis not present

## 2018-04-11 DIAGNOSIS — I129 Hypertensive chronic kidney disease with stage 1 through stage 4 chronic kidney disease, or unspecified chronic kidney disease: Secondary | ICD-10-CM | POA: Diagnosis not present

## 2018-04-11 DIAGNOSIS — E1151 Type 2 diabetes mellitus with diabetic peripheral angiopathy without gangrene: Secondary | ICD-10-CM | POA: Diagnosis not present

## 2018-04-11 DIAGNOSIS — I872 Venous insufficiency (chronic) (peripheral): Secondary | ICD-10-CM | POA: Diagnosis not present

## 2018-04-11 DIAGNOSIS — L97819 Non-pressure chronic ulcer of other part of right lower leg with unspecified severity: Secondary | ICD-10-CM | POA: Diagnosis not present

## 2018-04-11 DIAGNOSIS — E1122 Type 2 diabetes mellitus with diabetic chronic kidney disease: Secondary | ICD-10-CM | POA: Diagnosis not present

## 2018-04-11 DIAGNOSIS — I70311 Atherosclerosis of unspecified type of bypass graft(s) of the extremities with intermittent claudication, right leg: Secondary | ICD-10-CM | POA: Diagnosis not present

## 2018-04-11 DIAGNOSIS — I70312 Atherosclerosis of unspecified type of bypass graft(s) of the extremities with intermittent claudication, left leg: Secondary | ICD-10-CM | POA: Diagnosis not present

## 2018-04-11 DIAGNOSIS — I890 Lymphedema, not elsewhere classified: Secondary | ICD-10-CM | POA: Diagnosis not present

## 2018-04-13 DIAGNOSIS — L97819 Non-pressure chronic ulcer of other part of right lower leg with unspecified severity: Secondary | ICD-10-CM | POA: Diagnosis not present

## 2018-04-13 DIAGNOSIS — I890 Lymphedema, not elsewhere classified: Secondary | ICD-10-CM | POA: Diagnosis not present

## 2018-04-13 DIAGNOSIS — E1151 Type 2 diabetes mellitus with diabetic peripheral angiopathy without gangrene: Secondary | ICD-10-CM | POA: Diagnosis not present

## 2018-04-13 DIAGNOSIS — I70312 Atherosclerosis of unspecified type of bypass graft(s) of the extremities with intermittent claudication, left leg: Secondary | ICD-10-CM | POA: Diagnosis not present

## 2018-04-13 DIAGNOSIS — E1122 Type 2 diabetes mellitus with diabetic chronic kidney disease: Secondary | ICD-10-CM | POA: Diagnosis not present

## 2018-04-13 DIAGNOSIS — L97829 Non-pressure chronic ulcer of other part of left lower leg with unspecified severity: Secondary | ICD-10-CM | POA: Diagnosis not present

## 2018-04-13 DIAGNOSIS — I70311 Atherosclerosis of unspecified type of bypass graft(s) of the extremities with intermittent claudication, right leg: Secondary | ICD-10-CM | POA: Diagnosis not present

## 2018-04-13 DIAGNOSIS — I872 Venous insufficiency (chronic) (peripheral): Secondary | ICD-10-CM | POA: Diagnosis not present

## 2018-04-13 DIAGNOSIS — I129 Hypertensive chronic kidney disease with stage 1 through stage 4 chronic kidney disease, or unspecified chronic kidney disease: Secondary | ICD-10-CM | POA: Diagnosis not present

## 2018-04-16 DIAGNOSIS — L97819 Non-pressure chronic ulcer of other part of right lower leg with unspecified severity: Secondary | ICD-10-CM | POA: Diagnosis not present

## 2018-04-16 DIAGNOSIS — I890 Lymphedema, not elsewhere classified: Secondary | ICD-10-CM | POA: Diagnosis not present

## 2018-04-16 DIAGNOSIS — I872 Venous insufficiency (chronic) (peripheral): Secondary | ICD-10-CM | POA: Diagnosis not present

## 2018-04-16 DIAGNOSIS — I129 Hypertensive chronic kidney disease with stage 1 through stage 4 chronic kidney disease, or unspecified chronic kidney disease: Secondary | ICD-10-CM | POA: Diagnosis not present

## 2018-04-16 DIAGNOSIS — L97829 Non-pressure chronic ulcer of other part of left lower leg with unspecified severity: Secondary | ICD-10-CM | POA: Diagnosis not present

## 2018-04-16 DIAGNOSIS — I70312 Atherosclerosis of unspecified type of bypass graft(s) of the extremities with intermittent claudication, left leg: Secondary | ICD-10-CM | POA: Diagnosis not present

## 2018-04-16 DIAGNOSIS — I70311 Atherosclerosis of unspecified type of bypass graft(s) of the extremities with intermittent claudication, right leg: Secondary | ICD-10-CM | POA: Diagnosis not present

## 2018-04-16 DIAGNOSIS — E1122 Type 2 diabetes mellitus with diabetic chronic kidney disease: Secondary | ICD-10-CM | POA: Diagnosis not present

## 2018-04-16 DIAGNOSIS — E1151 Type 2 diabetes mellitus with diabetic peripheral angiopathy without gangrene: Secondary | ICD-10-CM | POA: Diagnosis not present

## 2018-04-17 DIAGNOSIS — H1131 Conjunctival hemorrhage, right eye: Secondary | ICD-10-CM | POA: Diagnosis not present

## 2018-04-19 DIAGNOSIS — G40319 Generalized idiopathic epilepsy and epileptic syndromes, intractable, without status epilepticus: Secondary | ICD-10-CM | POA: Diagnosis not present

## 2018-04-19 DIAGNOSIS — I70311 Atherosclerosis of unspecified type of bypass graft(s) of the extremities with intermittent claudication, right leg: Secondary | ICD-10-CM | POA: Diagnosis not present

## 2018-04-19 DIAGNOSIS — E1142 Type 2 diabetes mellitus with diabetic polyneuropathy: Secondary | ICD-10-CM | POA: Diagnosis not present

## 2018-04-19 DIAGNOSIS — L97829 Non-pressure chronic ulcer of other part of left lower leg with unspecified severity: Secondary | ICD-10-CM | POA: Diagnosis not present

## 2018-04-19 DIAGNOSIS — E1151 Type 2 diabetes mellitus with diabetic peripheral angiopathy without gangrene: Secondary | ICD-10-CM | POA: Diagnosis not present

## 2018-04-19 DIAGNOSIS — L97819 Non-pressure chronic ulcer of other part of right lower leg with unspecified severity: Secondary | ICD-10-CM | POA: Diagnosis not present

## 2018-04-19 DIAGNOSIS — I129 Hypertensive chronic kidney disease with stage 1 through stage 4 chronic kidney disease, or unspecified chronic kidney disease: Secondary | ICD-10-CM | POA: Diagnosis not present

## 2018-04-19 DIAGNOSIS — I70312 Atherosclerosis of unspecified type of bypass graft(s) of the extremities with intermittent claudication, left leg: Secondary | ICD-10-CM | POA: Diagnosis not present

## 2018-04-20 DIAGNOSIS — E1122 Type 2 diabetes mellitus with diabetic chronic kidney disease: Secondary | ICD-10-CM | POA: Diagnosis not present

## 2018-04-20 DIAGNOSIS — I70311 Atherosclerosis of unspecified type of bypass graft(s) of the extremities with intermittent claudication, right leg: Secondary | ICD-10-CM | POA: Diagnosis not present

## 2018-04-20 DIAGNOSIS — I872 Venous insufficiency (chronic) (peripheral): Secondary | ICD-10-CM | POA: Diagnosis not present

## 2018-04-20 DIAGNOSIS — I890 Lymphedema, not elsewhere classified: Secondary | ICD-10-CM | POA: Diagnosis not present

## 2018-04-20 DIAGNOSIS — L97829 Non-pressure chronic ulcer of other part of left lower leg with unspecified severity: Secondary | ICD-10-CM | POA: Diagnosis not present

## 2018-04-20 DIAGNOSIS — I70312 Atherosclerosis of unspecified type of bypass graft(s) of the extremities with intermittent claudication, left leg: Secondary | ICD-10-CM | POA: Diagnosis not present

## 2018-04-20 DIAGNOSIS — L97819 Non-pressure chronic ulcer of other part of right lower leg with unspecified severity: Secondary | ICD-10-CM | POA: Diagnosis not present

## 2018-04-20 DIAGNOSIS — E1151 Type 2 diabetes mellitus with diabetic peripheral angiopathy without gangrene: Secondary | ICD-10-CM | POA: Diagnosis not present

## 2018-04-20 DIAGNOSIS — I129 Hypertensive chronic kidney disease with stage 1 through stage 4 chronic kidney disease, or unspecified chronic kidney disease: Secondary | ICD-10-CM | POA: Diagnosis not present

## 2018-04-23 DIAGNOSIS — I70312 Atherosclerosis of unspecified type of bypass graft(s) of the extremities with intermittent claudication, left leg: Secondary | ICD-10-CM | POA: Diagnosis not present

## 2018-04-23 DIAGNOSIS — I890 Lymphedema, not elsewhere classified: Secondary | ICD-10-CM | POA: Diagnosis not present

## 2018-04-23 DIAGNOSIS — E1122 Type 2 diabetes mellitus with diabetic chronic kidney disease: Secondary | ICD-10-CM | POA: Diagnosis not present

## 2018-04-23 DIAGNOSIS — L97819 Non-pressure chronic ulcer of other part of right lower leg with unspecified severity: Secondary | ICD-10-CM | POA: Diagnosis not present

## 2018-04-23 DIAGNOSIS — I70311 Atherosclerosis of unspecified type of bypass graft(s) of the extremities with intermittent claudication, right leg: Secondary | ICD-10-CM | POA: Diagnosis not present

## 2018-04-23 DIAGNOSIS — I872 Venous insufficiency (chronic) (peripheral): Secondary | ICD-10-CM | POA: Diagnosis not present

## 2018-04-23 DIAGNOSIS — L97829 Non-pressure chronic ulcer of other part of left lower leg with unspecified severity: Secondary | ICD-10-CM | POA: Diagnosis not present

## 2018-04-23 DIAGNOSIS — I129 Hypertensive chronic kidney disease with stage 1 through stage 4 chronic kidney disease, or unspecified chronic kidney disease: Secondary | ICD-10-CM | POA: Diagnosis not present

## 2018-04-23 DIAGNOSIS — E1151 Type 2 diabetes mellitus with diabetic peripheral angiopathy without gangrene: Secondary | ICD-10-CM | POA: Diagnosis not present

## 2018-04-25 DIAGNOSIS — Z7901 Long term (current) use of anticoagulants: Secondary | ICD-10-CM | POA: Diagnosis not present

## 2018-04-27 DIAGNOSIS — E1122 Type 2 diabetes mellitus with diabetic chronic kidney disease: Secondary | ICD-10-CM | POA: Diagnosis not present

## 2018-04-27 DIAGNOSIS — E1151 Type 2 diabetes mellitus with diabetic peripheral angiopathy without gangrene: Secondary | ICD-10-CM | POA: Diagnosis not present

## 2018-04-27 DIAGNOSIS — I129 Hypertensive chronic kidney disease with stage 1 through stage 4 chronic kidney disease, or unspecified chronic kidney disease: Secondary | ICD-10-CM | POA: Diagnosis not present

## 2018-04-27 DIAGNOSIS — L97819 Non-pressure chronic ulcer of other part of right lower leg with unspecified severity: Secondary | ICD-10-CM | POA: Diagnosis not present

## 2018-04-27 DIAGNOSIS — I70312 Atherosclerosis of unspecified type of bypass graft(s) of the extremities with intermittent claudication, left leg: Secondary | ICD-10-CM | POA: Diagnosis not present

## 2018-04-27 DIAGNOSIS — L97829 Non-pressure chronic ulcer of other part of left lower leg with unspecified severity: Secondary | ICD-10-CM | POA: Diagnosis not present

## 2018-04-27 DIAGNOSIS — I70311 Atherosclerosis of unspecified type of bypass graft(s) of the extremities with intermittent claudication, right leg: Secondary | ICD-10-CM | POA: Diagnosis not present

## 2018-04-27 DIAGNOSIS — I872 Venous insufficiency (chronic) (peripheral): Secondary | ICD-10-CM | POA: Diagnosis not present

## 2018-04-27 DIAGNOSIS — I890 Lymphedema, not elsewhere classified: Secondary | ICD-10-CM | POA: Diagnosis not present

## 2018-04-30 DIAGNOSIS — L97829 Non-pressure chronic ulcer of other part of left lower leg with unspecified severity: Secondary | ICD-10-CM | POA: Diagnosis not present

## 2018-04-30 DIAGNOSIS — I129 Hypertensive chronic kidney disease with stage 1 through stage 4 chronic kidney disease, or unspecified chronic kidney disease: Secondary | ICD-10-CM | POA: Diagnosis not present

## 2018-04-30 DIAGNOSIS — I70311 Atherosclerosis of unspecified type of bypass graft(s) of the extremities with intermittent claudication, right leg: Secondary | ICD-10-CM | POA: Diagnosis not present

## 2018-04-30 DIAGNOSIS — L97819 Non-pressure chronic ulcer of other part of right lower leg with unspecified severity: Secondary | ICD-10-CM | POA: Diagnosis not present

## 2018-04-30 DIAGNOSIS — E1122 Type 2 diabetes mellitus with diabetic chronic kidney disease: Secondary | ICD-10-CM | POA: Diagnosis not present

## 2018-04-30 DIAGNOSIS — I70312 Atherosclerosis of unspecified type of bypass graft(s) of the extremities with intermittent claudication, left leg: Secondary | ICD-10-CM | POA: Diagnosis not present

## 2018-04-30 DIAGNOSIS — I872 Venous insufficiency (chronic) (peripheral): Secondary | ICD-10-CM | POA: Diagnosis not present

## 2018-04-30 DIAGNOSIS — I890 Lymphedema, not elsewhere classified: Secondary | ICD-10-CM | POA: Diagnosis not present

## 2018-04-30 DIAGNOSIS — E1151 Type 2 diabetes mellitus with diabetic peripheral angiopathy without gangrene: Secondary | ICD-10-CM | POA: Diagnosis not present

## 2018-05-03 DIAGNOSIS — E1151 Type 2 diabetes mellitus with diabetic peripheral angiopathy without gangrene: Secondary | ICD-10-CM | POA: Diagnosis not present

## 2018-05-03 DIAGNOSIS — I129 Hypertensive chronic kidney disease with stage 1 through stage 4 chronic kidney disease, or unspecified chronic kidney disease: Secondary | ICD-10-CM | POA: Diagnosis not present

## 2018-05-03 DIAGNOSIS — I70312 Atherosclerosis of unspecified type of bypass graft(s) of the extremities with intermittent claudication, left leg: Secondary | ICD-10-CM | POA: Diagnosis not present

## 2018-05-03 DIAGNOSIS — E1122 Type 2 diabetes mellitus with diabetic chronic kidney disease: Secondary | ICD-10-CM | POA: Diagnosis not present

## 2018-05-03 DIAGNOSIS — I872 Venous insufficiency (chronic) (peripheral): Secondary | ICD-10-CM | POA: Diagnosis not present

## 2018-05-03 DIAGNOSIS — I890 Lymphedema, not elsewhere classified: Secondary | ICD-10-CM | POA: Diagnosis not present

## 2018-05-03 DIAGNOSIS — L97829 Non-pressure chronic ulcer of other part of left lower leg with unspecified severity: Secondary | ICD-10-CM | POA: Diagnosis not present

## 2018-05-03 DIAGNOSIS — L97819 Non-pressure chronic ulcer of other part of right lower leg with unspecified severity: Secondary | ICD-10-CM | POA: Diagnosis not present

## 2018-05-03 DIAGNOSIS — I70311 Atherosclerosis of unspecified type of bypass graft(s) of the extremities with intermittent claudication, right leg: Secondary | ICD-10-CM | POA: Diagnosis not present

## 2018-05-07 DIAGNOSIS — I70312 Atherosclerosis of unspecified type of bypass graft(s) of the extremities with intermittent claudication, left leg: Secondary | ICD-10-CM | POA: Diagnosis not present

## 2018-05-07 DIAGNOSIS — I129 Hypertensive chronic kidney disease with stage 1 through stage 4 chronic kidney disease, or unspecified chronic kidney disease: Secondary | ICD-10-CM | POA: Diagnosis not present

## 2018-05-07 DIAGNOSIS — I872 Venous insufficiency (chronic) (peripheral): Secondary | ICD-10-CM | POA: Diagnosis not present

## 2018-05-07 DIAGNOSIS — L97819 Non-pressure chronic ulcer of other part of right lower leg with unspecified severity: Secondary | ICD-10-CM | POA: Diagnosis not present

## 2018-05-07 DIAGNOSIS — E1151 Type 2 diabetes mellitus with diabetic peripheral angiopathy without gangrene: Secondary | ICD-10-CM | POA: Diagnosis not present

## 2018-05-07 DIAGNOSIS — E1122 Type 2 diabetes mellitus with diabetic chronic kidney disease: Secondary | ICD-10-CM | POA: Diagnosis not present

## 2018-05-07 DIAGNOSIS — I70311 Atherosclerosis of unspecified type of bypass graft(s) of the extremities with intermittent claudication, right leg: Secondary | ICD-10-CM | POA: Diagnosis not present

## 2018-05-07 DIAGNOSIS — I890 Lymphedema, not elsewhere classified: Secondary | ICD-10-CM | POA: Diagnosis not present

## 2018-05-07 DIAGNOSIS — L97829 Non-pressure chronic ulcer of other part of left lower leg with unspecified severity: Secondary | ICD-10-CM | POA: Diagnosis not present

## 2018-05-10 DIAGNOSIS — E11621 Type 2 diabetes mellitus with foot ulcer: Secondary | ICD-10-CM | POA: Diagnosis not present

## 2018-05-10 DIAGNOSIS — Z23 Encounter for immunization: Secondary | ICD-10-CM | POA: Diagnosis not present

## 2018-05-11 DIAGNOSIS — E1122 Type 2 diabetes mellitus with diabetic chronic kidney disease: Secondary | ICD-10-CM | POA: Diagnosis not present

## 2018-05-11 DIAGNOSIS — E1151 Type 2 diabetes mellitus with diabetic peripheral angiopathy without gangrene: Secondary | ICD-10-CM | POA: Diagnosis not present

## 2018-05-11 DIAGNOSIS — I890 Lymphedema, not elsewhere classified: Secondary | ICD-10-CM | POA: Diagnosis not present

## 2018-05-11 DIAGNOSIS — L97829 Non-pressure chronic ulcer of other part of left lower leg with unspecified severity: Secondary | ICD-10-CM | POA: Diagnosis not present

## 2018-05-11 DIAGNOSIS — L97819 Non-pressure chronic ulcer of other part of right lower leg with unspecified severity: Secondary | ICD-10-CM | POA: Diagnosis not present

## 2018-05-11 DIAGNOSIS — I70312 Atherosclerosis of unspecified type of bypass graft(s) of the extremities with intermittent claudication, left leg: Secondary | ICD-10-CM | POA: Diagnosis not present

## 2018-05-11 DIAGNOSIS — E11621 Type 2 diabetes mellitus with foot ulcer: Secondary | ICD-10-CM | POA: Diagnosis not present

## 2018-05-11 DIAGNOSIS — I129 Hypertensive chronic kidney disease with stage 1 through stage 4 chronic kidney disease, or unspecified chronic kidney disease: Secondary | ICD-10-CM | POA: Diagnosis not present

## 2018-05-11 DIAGNOSIS — I872 Venous insufficiency (chronic) (peripheral): Secondary | ICD-10-CM | POA: Diagnosis not present

## 2018-05-11 DIAGNOSIS — L97511 Non-pressure chronic ulcer of other part of right foot limited to breakdown of skin: Secondary | ICD-10-CM | POA: Diagnosis not present

## 2018-05-11 DIAGNOSIS — I70311 Atherosclerosis of unspecified type of bypass graft(s) of the extremities with intermittent claudication, right leg: Secondary | ICD-10-CM | POA: Diagnosis not present

## 2018-05-14 DIAGNOSIS — I872 Venous insufficiency (chronic) (peripheral): Secondary | ICD-10-CM | POA: Diagnosis not present

## 2018-05-14 DIAGNOSIS — E1122 Type 2 diabetes mellitus with diabetic chronic kidney disease: Secondary | ICD-10-CM | POA: Diagnosis not present

## 2018-05-14 DIAGNOSIS — E1151 Type 2 diabetes mellitus with diabetic peripheral angiopathy without gangrene: Secondary | ICD-10-CM | POA: Diagnosis not present

## 2018-05-14 DIAGNOSIS — I129 Hypertensive chronic kidney disease with stage 1 through stage 4 chronic kidney disease, or unspecified chronic kidney disease: Secondary | ICD-10-CM | POA: Diagnosis not present

## 2018-05-14 DIAGNOSIS — L97819 Non-pressure chronic ulcer of other part of right lower leg with unspecified severity: Secondary | ICD-10-CM | POA: Diagnosis not present

## 2018-05-14 DIAGNOSIS — I890 Lymphedema, not elsewhere classified: Secondary | ICD-10-CM | POA: Diagnosis not present

## 2018-05-14 DIAGNOSIS — L97829 Non-pressure chronic ulcer of other part of left lower leg with unspecified severity: Secondary | ICD-10-CM | POA: Diagnosis not present

## 2018-05-14 DIAGNOSIS — I70311 Atherosclerosis of unspecified type of bypass graft(s) of the extremities with intermittent claudication, right leg: Secondary | ICD-10-CM | POA: Diagnosis not present

## 2018-05-14 DIAGNOSIS — I70312 Atherosclerosis of unspecified type of bypass graft(s) of the extremities with intermittent claudication, left leg: Secondary | ICD-10-CM | POA: Diagnosis not present

## 2018-05-18 DIAGNOSIS — L97829 Non-pressure chronic ulcer of other part of left lower leg with unspecified severity: Secondary | ICD-10-CM | POA: Diagnosis not present

## 2018-05-18 DIAGNOSIS — I70311 Atherosclerosis of unspecified type of bypass graft(s) of the extremities with intermittent claudication, right leg: Secondary | ICD-10-CM | POA: Diagnosis not present

## 2018-05-18 DIAGNOSIS — I872 Venous insufficiency (chronic) (peripheral): Secondary | ICD-10-CM | POA: Diagnosis not present

## 2018-05-18 DIAGNOSIS — L97819 Non-pressure chronic ulcer of other part of right lower leg with unspecified severity: Secondary | ICD-10-CM | POA: Diagnosis not present

## 2018-05-18 DIAGNOSIS — E1122 Type 2 diabetes mellitus with diabetic chronic kidney disease: Secondary | ICD-10-CM | POA: Diagnosis not present

## 2018-05-18 DIAGNOSIS — I129 Hypertensive chronic kidney disease with stage 1 through stage 4 chronic kidney disease, or unspecified chronic kidney disease: Secondary | ICD-10-CM | POA: Diagnosis not present

## 2018-05-18 DIAGNOSIS — I890 Lymphedema, not elsewhere classified: Secondary | ICD-10-CM | POA: Diagnosis not present

## 2018-05-18 DIAGNOSIS — E1151 Type 2 diabetes mellitus with diabetic peripheral angiopathy without gangrene: Secondary | ICD-10-CM | POA: Diagnosis not present

## 2018-05-18 DIAGNOSIS — I70312 Atherosclerosis of unspecified type of bypass graft(s) of the extremities with intermittent claudication, left leg: Secondary | ICD-10-CM | POA: Diagnosis not present

## 2018-05-21 DIAGNOSIS — I70311 Atherosclerosis of unspecified type of bypass graft(s) of the extremities with intermittent claudication, right leg: Secondary | ICD-10-CM | POA: Diagnosis not present

## 2018-05-21 DIAGNOSIS — I872 Venous insufficiency (chronic) (peripheral): Secondary | ICD-10-CM | POA: Diagnosis not present

## 2018-05-21 DIAGNOSIS — I70312 Atherosclerosis of unspecified type of bypass graft(s) of the extremities with intermittent claudication, left leg: Secondary | ICD-10-CM | POA: Diagnosis not present

## 2018-05-21 DIAGNOSIS — I890 Lymphedema, not elsewhere classified: Secondary | ICD-10-CM | POA: Diagnosis not present

## 2018-05-21 DIAGNOSIS — E1151 Type 2 diabetes mellitus with diabetic peripheral angiopathy without gangrene: Secondary | ICD-10-CM | POA: Diagnosis not present

## 2018-05-21 DIAGNOSIS — L97829 Non-pressure chronic ulcer of other part of left lower leg with unspecified severity: Secondary | ICD-10-CM | POA: Diagnosis not present

## 2018-05-21 DIAGNOSIS — E1122 Type 2 diabetes mellitus with diabetic chronic kidney disease: Secondary | ICD-10-CM | POA: Diagnosis not present

## 2018-05-21 DIAGNOSIS — I129 Hypertensive chronic kidney disease with stage 1 through stage 4 chronic kidney disease, or unspecified chronic kidney disease: Secondary | ICD-10-CM | POA: Diagnosis not present

## 2018-05-21 DIAGNOSIS — L97819 Non-pressure chronic ulcer of other part of right lower leg with unspecified severity: Secondary | ICD-10-CM | POA: Diagnosis not present

## 2018-05-25 DIAGNOSIS — E1151 Type 2 diabetes mellitus with diabetic peripheral angiopathy without gangrene: Secondary | ICD-10-CM | POA: Diagnosis not present

## 2018-05-25 DIAGNOSIS — L97819 Non-pressure chronic ulcer of other part of right lower leg with unspecified severity: Secondary | ICD-10-CM | POA: Diagnosis not present

## 2018-05-25 DIAGNOSIS — E1122 Type 2 diabetes mellitus with diabetic chronic kidney disease: Secondary | ICD-10-CM | POA: Diagnosis not present

## 2018-05-25 DIAGNOSIS — I70311 Atherosclerosis of unspecified type of bypass graft(s) of the extremities with intermittent claudication, right leg: Secondary | ICD-10-CM | POA: Diagnosis not present

## 2018-05-25 DIAGNOSIS — I890 Lymphedema, not elsewhere classified: Secondary | ICD-10-CM | POA: Diagnosis not present

## 2018-05-25 DIAGNOSIS — I129 Hypertensive chronic kidney disease with stage 1 through stage 4 chronic kidney disease, or unspecified chronic kidney disease: Secondary | ICD-10-CM | POA: Diagnosis not present

## 2018-05-25 DIAGNOSIS — L97829 Non-pressure chronic ulcer of other part of left lower leg with unspecified severity: Secondary | ICD-10-CM | POA: Diagnosis not present

## 2018-05-25 DIAGNOSIS — I872 Venous insufficiency (chronic) (peripheral): Secondary | ICD-10-CM | POA: Diagnosis not present

## 2018-05-25 DIAGNOSIS — I70312 Atherosclerosis of unspecified type of bypass graft(s) of the extremities with intermittent claudication, left leg: Secondary | ICD-10-CM | POA: Diagnosis not present

## 2018-05-28 DIAGNOSIS — I70312 Atherosclerosis of unspecified type of bypass graft(s) of the extremities with intermittent claudication, left leg: Secondary | ICD-10-CM | POA: Diagnosis not present

## 2018-05-28 DIAGNOSIS — I872 Venous insufficiency (chronic) (peripheral): Secondary | ICD-10-CM | POA: Diagnosis not present

## 2018-05-28 DIAGNOSIS — L97819 Non-pressure chronic ulcer of other part of right lower leg with unspecified severity: Secondary | ICD-10-CM | POA: Diagnosis not present

## 2018-05-28 DIAGNOSIS — I890 Lymphedema, not elsewhere classified: Secondary | ICD-10-CM | POA: Diagnosis not present

## 2018-05-28 DIAGNOSIS — E1122 Type 2 diabetes mellitus with diabetic chronic kidney disease: Secondary | ICD-10-CM | POA: Diagnosis not present

## 2018-05-28 DIAGNOSIS — I129 Hypertensive chronic kidney disease with stage 1 through stage 4 chronic kidney disease, or unspecified chronic kidney disease: Secondary | ICD-10-CM | POA: Diagnosis not present

## 2018-05-28 DIAGNOSIS — I70311 Atherosclerosis of unspecified type of bypass graft(s) of the extremities with intermittent claudication, right leg: Secondary | ICD-10-CM | POA: Diagnosis not present

## 2018-05-28 DIAGNOSIS — E1151 Type 2 diabetes mellitus with diabetic peripheral angiopathy without gangrene: Secondary | ICD-10-CM | POA: Diagnosis not present

## 2018-05-28 DIAGNOSIS — L97829 Non-pressure chronic ulcer of other part of left lower leg with unspecified severity: Secondary | ICD-10-CM | POA: Diagnosis not present

## 2018-06-04 DIAGNOSIS — Z7901 Long term (current) use of anticoagulants: Secondary | ICD-10-CM | POA: Diagnosis not present

## 2018-06-05 DIAGNOSIS — I872 Venous insufficiency (chronic) (peripheral): Secondary | ICD-10-CM | POA: Diagnosis not present

## 2018-06-05 DIAGNOSIS — I129 Hypertensive chronic kidney disease with stage 1 through stage 4 chronic kidney disease, or unspecified chronic kidney disease: Secondary | ICD-10-CM | POA: Diagnosis not present

## 2018-06-05 DIAGNOSIS — I70312 Atherosclerosis of unspecified type of bypass graft(s) of the extremities with intermittent claudication, left leg: Secondary | ICD-10-CM | POA: Diagnosis not present

## 2018-06-05 DIAGNOSIS — E1151 Type 2 diabetes mellitus with diabetic peripheral angiopathy without gangrene: Secondary | ICD-10-CM | POA: Diagnosis not present

## 2018-06-05 DIAGNOSIS — L97819 Non-pressure chronic ulcer of other part of right lower leg with unspecified severity: Secondary | ICD-10-CM | POA: Diagnosis not present

## 2018-06-05 DIAGNOSIS — I70311 Atherosclerosis of unspecified type of bypass graft(s) of the extremities with intermittent claudication, right leg: Secondary | ICD-10-CM | POA: Diagnosis not present

## 2018-06-05 DIAGNOSIS — I890 Lymphedema, not elsewhere classified: Secondary | ICD-10-CM | POA: Diagnosis not present

## 2018-06-05 DIAGNOSIS — L97829 Non-pressure chronic ulcer of other part of left lower leg with unspecified severity: Secondary | ICD-10-CM | POA: Diagnosis not present

## 2018-06-05 DIAGNOSIS — E1122 Type 2 diabetes mellitus with diabetic chronic kidney disease: Secondary | ICD-10-CM | POA: Diagnosis not present

## 2018-06-10 DIAGNOSIS — L97829 Non-pressure chronic ulcer of other part of left lower leg with unspecified severity: Secondary | ICD-10-CM | POA: Diagnosis not present

## 2018-06-10 DIAGNOSIS — E1122 Type 2 diabetes mellitus with diabetic chronic kidney disease: Secondary | ICD-10-CM | POA: Diagnosis not present

## 2018-06-10 DIAGNOSIS — I70311 Atherosclerosis of unspecified type of bypass graft(s) of the extremities with intermittent claudication, right leg: Secondary | ICD-10-CM | POA: Diagnosis not present

## 2018-06-10 DIAGNOSIS — L97819 Non-pressure chronic ulcer of other part of right lower leg with unspecified severity: Secondary | ICD-10-CM | POA: Diagnosis not present

## 2018-06-10 DIAGNOSIS — I890 Lymphedema, not elsewhere classified: Secondary | ICD-10-CM | POA: Diagnosis not present

## 2018-06-10 DIAGNOSIS — E1151 Type 2 diabetes mellitus with diabetic peripheral angiopathy without gangrene: Secondary | ICD-10-CM | POA: Diagnosis not present

## 2018-06-10 DIAGNOSIS — I129 Hypertensive chronic kidney disease with stage 1 through stage 4 chronic kidney disease, or unspecified chronic kidney disease: Secondary | ICD-10-CM | POA: Diagnosis not present

## 2018-06-10 DIAGNOSIS — I872 Venous insufficiency (chronic) (peripheral): Secondary | ICD-10-CM | POA: Diagnosis not present

## 2018-06-10 DIAGNOSIS — I70312 Atherosclerosis of unspecified type of bypass graft(s) of the extremities with intermittent claudication, left leg: Secondary | ICD-10-CM | POA: Diagnosis not present

## 2018-06-12 DIAGNOSIS — I70311 Atherosclerosis of unspecified type of bypass graft(s) of the extremities with intermittent claudication, right leg: Secondary | ICD-10-CM | POA: Diagnosis not present

## 2018-06-12 DIAGNOSIS — I890 Lymphedema, not elsewhere classified: Secondary | ICD-10-CM | POA: Diagnosis not present

## 2018-06-12 DIAGNOSIS — I872 Venous insufficiency (chronic) (peripheral): Secondary | ICD-10-CM | POA: Diagnosis not present

## 2018-06-12 DIAGNOSIS — I70312 Atherosclerosis of unspecified type of bypass graft(s) of the extremities with intermittent claudication, left leg: Secondary | ICD-10-CM | POA: Diagnosis not present

## 2018-06-12 DIAGNOSIS — L97829 Non-pressure chronic ulcer of other part of left lower leg with unspecified severity: Secondary | ICD-10-CM | POA: Diagnosis not present

## 2018-06-12 DIAGNOSIS — E1151 Type 2 diabetes mellitus with diabetic peripheral angiopathy without gangrene: Secondary | ICD-10-CM | POA: Diagnosis not present

## 2018-06-12 DIAGNOSIS — I129 Hypertensive chronic kidney disease with stage 1 through stage 4 chronic kidney disease, or unspecified chronic kidney disease: Secondary | ICD-10-CM | POA: Diagnosis not present

## 2018-06-12 DIAGNOSIS — L97819 Non-pressure chronic ulcer of other part of right lower leg with unspecified severity: Secondary | ICD-10-CM | POA: Diagnosis not present

## 2018-06-12 DIAGNOSIS — E1122 Type 2 diabetes mellitus with diabetic chronic kidney disease: Secondary | ICD-10-CM | POA: Diagnosis not present

## 2018-06-13 DIAGNOSIS — E1151 Type 2 diabetes mellitus with diabetic peripheral angiopathy without gangrene: Secondary | ICD-10-CM | POA: Diagnosis not present

## 2018-06-13 DIAGNOSIS — I70312 Atherosclerosis of unspecified type of bypass graft(s) of the extremities with intermittent claudication, left leg: Secondary | ICD-10-CM | POA: Diagnosis not present

## 2018-06-13 DIAGNOSIS — I70311 Atherosclerosis of unspecified type of bypass graft(s) of the extremities with intermittent claudication, right leg: Secondary | ICD-10-CM | POA: Diagnosis not present

## 2018-06-18 DIAGNOSIS — I70311 Atherosclerosis of unspecified type of bypass graft(s) of the extremities with intermittent claudication, right leg: Secondary | ICD-10-CM | POA: Diagnosis not present

## 2018-06-18 DIAGNOSIS — I872 Venous insufficiency (chronic) (peripheral): Secondary | ICD-10-CM | POA: Diagnosis not present

## 2018-06-18 DIAGNOSIS — I129 Hypertensive chronic kidney disease with stage 1 through stage 4 chronic kidney disease, or unspecified chronic kidney disease: Secondary | ICD-10-CM | POA: Diagnosis not present

## 2018-06-18 DIAGNOSIS — I890 Lymphedema, not elsewhere classified: Secondary | ICD-10-CM | POA: Diagnosis not present

## 2018-06-18 DIAGNOSIS — I70312 Atherosclerosis of unspecified type of bypass graft(s) of the extremities with intermittent claudication, left leg: Secondary | ICD-10-CM | POA: Diagnosis not present

## 2018-06-18 DIAGNOSIS — E1122 Type 2 diabetes mellitus with diabetic chronic kidney disease: Secondary | ICD-10-CM | POA: Diagnosis not present

## 2018-06-18 DIAGNOSIS — E1151 Type 2 diabetes mellitus with diabetic peripheral angiopathy without gangrene: Secondary | ICD-10-CM | POA: Diagnosis not present

## 2018-06-18 DIAGNOSIS — L97819 Non-pressure chronic ulcer of other part of right lower leg with unspecified severity: Secondary | ICD-10-CM | POA: Diagnosis not present

## 2018-06-18 DIAGNOSIS — L97829 Non-pressure chronic ulcer of other part of left lower leg with unspecified severity: Secondary | ICD-10-CM | POA: Diagnosis not present

## 2018-06-26 DIAGNOSIS — I890 Lymphedema, not elsewhere classified: Secondary | ICD-10-CM | POA: Diagnosis not present

## 2018-06-26 DIAGNOSIS — E1151 Type 2 diabetes mellitus with diabetic peripheral angiopathy without gangrene: Secondary | ICD-10-CM | POA: Diagnosis not present

## 2018-06-26 DIAGNOSIS — I129 Hypertensive chronic kidney disease with stage 1 through stage 4 chronic kidney disease, or unspecified chronic kidney disease: Secondary | ICD-10-CM | POA: Diagnosis not present

## 2018-06-26 DIAGNOSIS — I872 Venous insufficiency (chronic) (peripheral): Secondary | ICD-10-CM | POA: Diagnosis not present

## 2018-06-26 DIAGNOSIS — L97829 Non-pressure chronic ulcer of other part of left lower leg with unspecified severity: Secondary | ICD-10-CM | POA: Diagnosis not present

## 2018-06-26 DIAGNOSIS — L97819 Non-pressure chronic ulcer of other part of right lower leg with unspecified severity: Secondary | ICD-10-CM | POA: Diagnosis not present

## 2018-06-26 DIAGNOSIS — I70312 Atherosclerosis of unspecified type of bypass graft(s) of the extremities with intermittent claudication, left leg: Secondary | ICD-10-CM | POA: Diagnosis not present

## 2018-06-26 DIAGNOSIS — E1122 Type 2 diabetes mellitus with diabetic chronic kidney disease: Secondary | ICD-10-CM | POA: Diagnosis not present

## 2018-06-26 DIAGNOSIS — I70311 Atherosclerosis of unspecified type of bypass graft(s) of the extremities with intermittent claudication, right leg: Secondary | ICD-10-CM | POA: Diagnosis not present

## 2018-07-03 DIAGNOSIS — I129 Hypertensive chronic kidney disease with stage 1 through stage 4 chronic kidney disease, or unspecified chronic kidney disease: Secondary | ICD-10-CM | POA: Diagnosis not present

## 2018-07-03 DIAGNOSIS — E1122 Type 2 diabetes mellitus with diabetic chronic kidney disease: Secondary | ICD-10-CM | POA: Diagnosis not present

## 2018-07-03 DIAGNOSIS — I70311 Atherosclerosis of unspecified type of bypass graft(s) of the extremities with intermittent claudication, right leg: Secondary | ICD-10-CM | POA: Diagnosis not present

## 2018-07-03 DIAGNOSIS — E1151 Type 2 diabetes mellitus with diabetic peripheral angiopathy without gangrene: Secondary | ICD-10-CM | POA: Diagnosis not present

## 2018-07-03 DIAGNOSIS — I70312 Atherosclerosis of unspecified type of bypass graft(s) of the extremities with intermittent claudication, left leg: Secondary | ICD-10-CM | POA: Diagnosis not present

## 2018-07-03 DIAGNOSIS — I890 Lymphedema, not elsewhere classified: Secondary | ICD-10-CM | POA: Diagnosis not present

## 2018-07-03 DIAGNOSIS — I872 Venous insufficiency (chronic) (peripheral): Secondary | ICD-10-CM | POA: Diagnosis not present

## 2018-07-03 DIAGNOSIS — L97829 Non-pressure chronic ulcer of other part of left lower leg with unspecified severity: Secondary | ICD-10-CM | POA: Diagnosis not present

## 2018-07-03 DIAGNOSIS — L97819 Non-pressure chronic ulcer of other part of right lower leg with unspecified severity: Secondary | ICD-10-CM | POA: Diagnosis not present

## 2018-07-09 DIAGNOSIS — I70311 Atherosclerosis of unspecified type of bypass graft(s) of the extremities with intermittent claudication, right leg: Secondary | ICD-10-CM | POA: Diagnosis not present

## 2018-07-09 DIAGNOSIS — I129 Hypertensive chronic kidney disease with stage 1 through stage 4 chronic kidney disease, or unspecified chronic kidney disease: Secondary | ICD-10-CM | POA: Diagnosis not present

## 2018-07-09 DIAGNOSIS — I70312 Atherosclerosis of unspecified type of bypass graft(s) of the extremities with intermittent claudication, left leg: Secondary | ICD-10-CM | POA: Diagnosis not present

## 2018-07-09 DIAGNOSIS — E1151 Type 2 diabetes mellitus with diabetic peripheral angiopathy without gangrene: Secondary | ICD-10-CM | POA: Diagnosis not present

## 2018-07-09 DIAGNOSIS — L97819 Non-pressure chronic ulcer of other part of right lower leg with unspecified severity: Secondary | ICD-10-CM | POA: Diagnosis not present

## 2018-07-09 DIAGNOSIS — I872 Venous insufficiency (chronic) (peripheral): Secondary | ICD-10-CM | POA: Diagnosis not present

## 2018-07-09 DIAGNOSIS — E1122 Type 2 diabetes mellitus with diabetic chronic kidney disease: Secondary | ICD-10-CM | POA: Diagnosis not present

## 2018-07-09 DIAGNOSIS — I890 Lymphedema, not elsewhere classified: Secondary | ICD-10-CM | POA: Diagnosis not present

## 2018-07-09 DIAGNOSIS — L97829 Non-pressure chronic ulcer of other part of left lower leg with unspecified severity: Secondary | ICD-10-CM | POA: Diagnosis not present

## 2018-07-10 DIAGNOSIS — Z7901 Long term (current) use of anticoagulants: Secondary | ICD-10-CM | POA: Diagnosis not present

## 2018-07-12 DIAGNOSIS — G4733 Obstructive sleep apnea (adult) (pediatric): Secondary | ICD-10-CM | POA: Diagnosis not present

## 2018-07-12 DIAGNOSIS — L03316 Cellulitis of umbilicus: Secondary | ICD-10-CM | POA: Diagnosis not present

## 2018-07-12 DIAGNOSIS — J300 Vasomotor rhinitis: Secondary | ICD-10-CM | POA: Diagnosis not present

## 2018-07-16 DIAGNOSIS — L97829 Non-pressure chronic ulcer of other part of left lower leg with unspecified severity: Secondary | ICD-10-CM | POA: Diagnosis not present

## 2018-07-16 DIAGNOSIS — L97819 Non-pressure chronic ulcer of other part of right lower leg with unspecified severity: Secondary | ICD-10-CM | POA: Diagnosis not present

## 2018-07-16 DIAGNOSIS — I70311 Atherosclerosis of unspecified type of bypass graft(s) of the extremities with intermittent claudication, right leg: Secondary | ICD-10-CM | POA: Diagnosis not present

## 2018-07-16 DIAGNOSIS — I872 Venous insufficiency (chronic) (peripheral): Secondary | ICD-10-CM | POA: Diagnosis not present

## 2018-07-16 DIAGNOSIS — I129 Hypertensive chronic kidney disease with stage 1 through stage 4 chronic kidney disease, or unspecified chronic kidney disease: Secondary | ICD-10-CM | POA: Diagnosis not present

## 2018-07-16 DIAGNOSIS — I70312 Atherosclerosis of unspecified type of bypass graft(s) of the extremities with intermittent claudication, left leg: Secondary | ICD-10-CM | POA: Diagnosis not present

## 2018-07-16 DIAGNOSIS — E1122 Type 2 diabetes mellitus with diabetic chronic kidney disease: Secondary | ICD-10-CM | POA: Diagnosis not present

## 2018-07-16 DIAGNOSIS — E1151 Type 2 diabetes mellitus with diabetic peripheral angiopathy without gangrene: Secondary | ICD-10-CM | POA: Diagnosis not present

## 2018-07-16 DIAGNOSIS — I890 Lymphedema, not elsewhere classified: Secondary | ICD-10-CM | POA: Diagnosis not present

## 2018-07-17 DIAGNOSIS — Z0000 Encounter for general adult medical examination without abnormal findings: Secondary | ICD-10-CM | POA: Diagnosis not present

## 2018-07-17 DIAGNOSIS — Z6841 Body Mass Index (BMI) 40.0 and over, adult: Secondary | ICD-10-CM | POA: Diagnosis not present

## 2018-07-24 DIAGNOSIS — H2513 Age-related nuclear cataract, bilateral: Secondary | ICD-10-CM | POA: Diagnosis not present

## 2018-07-24 DIAGNOSIS — E119 Type 2 diabetes mellitus without complications: Secondary | ICD-10-CM | POA: Diagnosis not present

## 2018-07-24 LAB — HM DIABETES EYE EXAM

## 2018-07-25 DIAGNOSIS — I890 Lymphedema, not elsewhere classified: Secondary | ICD-10-CM | POA: Diagnosis not present

## 2018-07-25 DIAGNOSIS — L97819 Non-pressure chronic ulcer of other part of right lower leg with unspecified severity: Secondary | ICD-10-CM | POA: Diagnosis not present

## 2018-07-25 DIAGNOSIS — I70311 Atherosclerosis of unspecified type of bypass graft(s) of the extremities with intermittent claudication, right leg: Secondary | ICD-10-CM | POA: Diagnosis not present

## 2018-07-25 DIAGNOSIS — I129 Hypertensive chronic kidney disease with stage 1 through stage 4 chronic kidney disease, or unspecified chronic kidney disease: Secondary | ICD-10-CM | POA: Diagnosis not present

## 2018-07-25 DIAGNOSIS — E1122 Type 2 diabetes mellitus with diabetic chronic kidney disease: Secondary | ICD-10-CM | POA: Diagnosis not present

## 2018-07-25 DIAGNOSIS — E1151 Type 2 diabetes mellitus with diabetic peripheral angiopathy without gangrene: Secondary | ICD-10-CM | POA: Diagnosis not present

## 2018-07-25 DIAGNOSIS — I872 Venous insufficiency (chronic) (peripheral): Secondary | ICD-10-CM | POA: Diagnosis not present

## 2018-07-25 DIAGNOSIS — L97829 Non-pressure chronic ulcer of other part of left lower leg with unspecified severity: Secondary | ICD-10-CM | POA: Diagnosis not present

## 2018-07-25 DIAGNOSIS — I70312 Atherosclerosis of unspecified type of bypass graft(s) of the extremities with intermittent claudication, left leg: Secondary | ICD-10-CM | POA: Diagnosis not present

## 2018-08-02 DIAGNOSIS — M542 Cervicalgia: Secondary | ICD-10-CM | POA: Diagnosis not present

## 2018-08-03 DIAGNOSIS — E1151 Type 2 diabetes mellitus with diabetic peripheral angiopathy without gangrene: Secondary | ICD-10-CM | POA: Diagnosis not present

## 2018-08-03 DIAGNOSIS — E1122 Type 2 diabetes mellitus with diabetic chronic kidney disease: Secondary | ICD-10-CM | POA: Diagnosis not present

## 2018-08-03 DIAGNOSIS — L97829 Non-pressure chronic ulcer of other part of left lower leg with unspecified severity: Secondary | ICD-10-CM | POA: Diagnosis not present

## 2018-08-03 DIAGNOSIS — I129 Hypertensive chronic kidney disease with stage 1 through stage 4 chronic kidney disease, or unspecified chronic kidney disease: Secondary | ICD-10-CM | POA: Diagnosis not present

## 2018-08-03 DIAGNOSIS — I70311 Atherosclerosis of unspecified type of bypass graft(s) of the extremities with intermittent claudication, right leg: Secondary | ICD-10-CM | POA: Diagnosis not present

## 2018-08-03 DIAGNOSIS — I890 Lymphedema, not elsewhere classified: Secondary | ICD-10-CM | POA: Diagnosis not present

## 2018-08-03 DIAGNOSIS — I70312 Atherosclerosis of unspecified type of bypass graft(s) of the extremities with intermittent claudication, left leg: Secondary | ICD-10-CM | POA: Diagnosis not present

## 2018-08-03 DIAGNOSIS — L97819 Non-pressure chronic ulcer of other part of right lower leg with unspecified severity: Secondary | ICD-10-CM | POA: Diagnosis not present

## 2018-08-03 DIAGNOSIS — I872 Venous insufficiency (chronic) (peripheral): Secondary | ICD-10-CM | POA: Diagnosis not present

## 2018-08-16 DIAGNOSIS — Z6841 Body Mass Index (BMI) 40.0 and over, adult: Secondary | ICD-10-CM | POA: Diagnosis not present

## 2018-08-16 DIAGNOSIS — I890 Lymphedema, not elsewhere classified: Secondary | ICD-10-CM | POA: Diagnosis not present

## 2018-08-16 DIAGNOSIS — D1723 Benign lipomatous neoplasm of skin and subcutaneous tissue of right leg: Secondary | ICD-10-CM | POA: Diagnosis not present

## 2018-08-16 DIAGNOSIS — I87311 Chronic venous hypertension (idiopathic) with ulcer of right lower extremity: Secondary | ICD-10-CM | POA: Diagnosis not present

## 2018-08-30 DIAGNOSIS — I87311 Chronic venous hypertension (idiopathic) with ulcer of right lower extremity: Secondary | ICD-10-CM | POA: Diagnosis not present

## 2018-08-30 DIAGNOSIS — I87312 Chronic venous hypertension (idiopathic) with ulcer of left lower extremity: Secondary | ICD-10-CM | POA: Diagnosis not present

## 2018-08-30 DIAGNOSIS — D1723 Benign lipomatous neoplasm of skin and subcutaneous tissue of right leg: Secondary | ICD-10-CM | POA: Diagnosis not present

## 2018-09-06 DIAGNOSIS — I87313 Chronic venous hypertension (idiopathic) with ulcer of bilateral lower extremity: Secondary | ICD-10-CM | POA: Diagnosis not present

## 2018-09-06 DIAGNOSIS — I87311 Chronic venous hypertension (idiopathic) with ulcer of right lower extremity: Secondary | ICD-10-CM | POA: Diagnosis not present

## 2018-09-06 DIAGNOSIS — Z7901 Long term (current) use of anticoagulants: Secondary | ICD-10-CM | POA: Diagnosis not present

## 2018-09-06 DIAGNOSIS — I87312 Chronic venous hypertension (idiopathic) with ulcer of left lower extremity: Secondary | ICD-10-CM | POA: Diagnosis not present

## 2018-09-13 DIAGNOSIS — I87311 Chronic venous hypertension (idiopathic) with ulcer of right lower extremity: Secondary | ICD-10-CM | POA: Diagnosis not present

## 2018-09-13 DIAGNOSIS — I87312 Chronic venous hypertension (idiopathic) with ulcer of left lower extremity: Secondary | ICD-10-CM | POA: Diagnosis not present

## 2018-09-13 DIAGNOSIS — N3942 Incontinence without sensory awareness: Secondary | ICD-10-CM | POA: Diagnosis not present

## 2018-09-13 DIAGNOSIS — I87313 Chronic venous hypertension (idiopathic) with ulcer of bilateral lower extremity: Secondary | ICD-10-CM | POA: Diagnosis not present

## 2018-09-20 DIAGNOSIS — Z1159 Encounter for screening for other viral diseases: Secondary | ICD-10-CM | POA: Diagnosis not present

## 2018-09-20 DIAGNOSIS — I87312 Chronic venous hypertension (idiopathic) with ulcer of left lower extremity: Secondary | ICD-10-CM | POA: Diagnosis not present

## 2018-09-20 DIAGNOSIS — I87313 Chronic venous hypertension (idiopathic) with ulcer of bilateral lower extremity: Secondary | ICD-10-CM | POA: Diagnosis not present

## 2018-09-20 DIAGNOSIS — I87311 Chronic venous hypertension (idiopathic) with ulcer of right lower extremity: Secondary | ICD-10-CM | POA: Diagnosis not present

## 2018-10-01 DIAGNOSIS — I87311 Chronic venous hypertension (idiopathic) with ulcer of right lower extremity: Secondary | ICD-10-CM | POA: Diagnosis not present

## 2018-10-01 DIAGNOSIS — I87312 Chronic venous hypertension (idiopathic) with ulcer of left lower extremity: Secondary | ICD-10-CM | POA: Diagnosis not present

## 2018-10-01 DIAGNOSIS — Z1159 Encounter for screening for other viral diseases: Secondary | ICD-10-CM | POA: Diagnosis not present

## 2018-10-04 DIAGNOSIS — I87312 Chronic venous hypertension (idiopathic) with ulcer of left lower extremity: Secondary | ICD-10-CM | POA: Diagnosis not present

## 2018-10-04 DIAGNOSIS — Z1159 Encounter for screening for other viral diseases: Secondary | ICD-10-CM | POA: Diagnosis not present

## 2018-10-04 DIAGNOSIS — I87311 Chronic venous hypertension (idiopathic) with ulcer of right lower extremity: Secondary | ICD-10-CM | POA: Diagnosis not present

## 2018-10-08 DIAGNOSIS — Z6841 Body Mass Index (BMI) 40.0 and over, adult: Secondary | ICD-10-CM | POA: Diagnosis not present

## 2018-10-08 DIAGNOSIS — E782 Mixed hyperlipidemia: Secondary | ICD-10-CM | POA: Diagnosis not present

## 2018-10-08 DIAGNOSIS — I87312 Chronic venous hypertension (idiopathic) with ulcer of left lower extremity: Secondary | ICD-10-CM | POA: Diagnosis not present

## 2018-10-08 DIAGNOSIS — G40A09 Absence epileptic syndrome, not intractable, without status epilepticus: Secondary | ICD-10-CM | POA: Diagnosis not present

## 2018-10-08 DIAGNOSIS — I2510 Atherosclerotic heart disease of native coronary artery without angina pectoris: Secondary | ICD-10-CM | POA: Diagnosis not present

## 2018-10-08 DIAGNOSIS — E1142 Type 2 diabetes mellitus with diabetic polyneuropathy: Secondary | ICD-10-CM | POA: Diagnosis not present

## 2018-10-08 DIAGNOSIS — I87311 Chronic venous hypertension (idiopathic) with ulcer of right lower extremity: Secondary | ICD-10-CM | POA: Diagnosis not present

## 2018-10-08 DIAGNOSIS — E1151 Type 2 diabetes mellitus with diabetic peripheral angiopathy without gangrene: Secondary | ICD-10-CM | POA: Diagnosis not present

## 2018-10-08 DIAGNOSIS — Z1159 Encounter for screening for other viral diseases: Secondary | ICD-10-CM | POA: Diagnosis not present

## 2018-10-08 DIAGNOSIS — I10 Essential (primary) hypertension: Secondary | ICD-10-CM | POA: Diagnosis not present

## 2018-10-12 DIAGNOSIS — E1151 Type 2 diabetes mellitus with diabetic peripheral angiopathy without gangrene: Secondary | ICD-10-CM | POA: Diagnosis not present

## 2018-10-12 DIAGNOSIS — I87311 Chronic venous hypertension (idiopathic) with ulcer of right lower extremity: Secondary | ICD-10-CM | POA: Diagnosis not present

## 2018-10-12 DIAGNOSIS — I87313 Chronic venous hypertension (idiopathic) with ulcer of bilateral lower extremity: Secondary | ICD-10-CM | POA: Diagnosis not present

## 2018-10-12 DIAGNOSIS — Z7901 Long term (current) use of anticoagulants: Secondary | ICD-10-CM | POA: Diagnosis not present

## 2018-10-12 DIAGNOSIS — I87312 Chronic venous hypertension (idiopathic) with ulcer of left lower extremity: Secondary | ICD-10-CM | POA: Diagnosis not present

## 2018-10-15 DIAGNOSIS — I87311 Chronic venous hypertension (idiopathic) with ulcer of right lower extremity: Secondary | ICD-10-CM | POA: Diagnosis not present

## 2018-10-15 DIAGNOSIS — I87312 Chronic venous hypertension (idiopathic) with ulcer of left lower extremity: Secondary | ICD-10-CM | POA: Diagnosis not present

## 2018-10-15 DIAGNOSIS — Z7901 Long term (current) use of anticoagulants: Secondary | ICD-10-CM | POA: Diagnosis not present

## 2018-10-15 DIAGNOSIS — E1151 Type 2 diabetes mellitus with diabetic peripheral angiopathy without gangrene: Secondary | ICD-10-CM | POA: Diagnosis not present

## 2018-10-15 DIAGNOSIS — I87313 Chronic venous hypertension (idiopathic) with ulcer of bilateral lower extremity: Secondary | ICD-10-CM | POA: Diagnosis not present

## 2018-10-19 DIAGNOSIS — I87313 Chronic venous hypertension (idiopathic) with ulcer of bilateral lower extremity: Secondary | ICD-10-CM | POA: Diagnosis not present

## 2018-10-19 DIAGNOSIS — I87311 Chronic venous hypertension (idiopathic) with ulcer of right lower extremity: Secondary | ICD-10-CM | POA: Diagnosis not present

## 2018-10-19 DIAGNOSIS — I87312 Chronic venous hypertension (idiopathic) with ulcer of left lower extremity: Secondary | ICD-10-CM | POA: Diagnosis not present

## 2018-10-26 DIAGNOSIS — I87311 Chronic venous hypertension (idiopathic) with ulcer of right lower extremity: Secondary | ICD-10-CM | POA: Diagnosis not present

## 2018-10-26 DIAGNOSIS — I87312 Chronic venous hypertension (idiopathic) with ulcer of left lower extremity: Secondary | ICD-10-CM | POA: Diagnosis not present

## 2018-10-26 DIAGNOSIS — I87313 Chronic venous hypertension (idiopathic) with ulcer of bilateral lower extremity: Secondary | ICD-10-CM | POA: Diagnosis not present

## 2018-11-02 DIAGNOSIS — I87311 Chronic venous hypertension (idiopathic) with ulcer of right lower extremity: Secondary | ICD-10-CM | POA: Diagnosis not present

## 2018-11-02 DIAGNOSIS — Z1159 Encounter for screening for other viral diseases: Secondary | ICD-10-CM | POA: Diagnosis not present

## 2018-11-02 DIAGNOSIS — I87313 Chronic venous hypertension (idiopathic) with ulcer of bilateral lower extremity: Secondary | ICD-10-CM | POA: Diagnosis not present

## 2018-11-02 DIAGNOSIS — I87312 Chronic venous hypertension (idiopathic) with ulcer of left lower extremity: Secondary | ICD-10-CM | POA: Diagnosis not present

## 2018-11-05 DIAGNOSIS — I87312 Chronic venous hypertension (idiopathic) with ulcer of left lower extremity: Secondary | ICD-10-CM | POA: Diagnosis not present

## 2018-11-05 DIAGNOSIS — I87313 Chronic venous hypertension (idiopathic) with ulcer of bilateral lower extremity: Secondary | ICD-10-CM | POA: Diagnosis not present

## 2018-11-05 DIAGNOSIS — Z1159 Encounter for screening for other viral diseases: Secondary | ICD-10-CM | POA: Diagnosis not present

## 2018-11-05 DIAGNOSIS — I87311 Chronic venous hypertension (idiopathic) with ulcer of right lower extremity: Secondary | ICD-10-CM | POA: Diagnosis not present

## 2018-11-09 DIAGNOSIS — I87311 Chronic venous hypertension (idiopathic) with ulcer of right lower extremity: Secondary | ICD-10-CM | POA: Diagnosis not present

## 2018-11-09 DIAGNOSIS — I87312 Chronic venous hypertension (idiopathic) with ulcer of left lower extremity: Secondary | ICD-10-CM | POA: Diagnosis not present

## 2018-11-09 DIAGNOSIS — Z1159 Encounter for screening for other viral diseases: Secondary | ICD-10-CM | POA: Diagnosis not present

## 2018-11-12 DIAGNOSIS — E782 Mixed hyperlipidemia: Secondary | ICD-10-CM | POA: Diagnosis not present

## 2018-11-12 DIAGNOSIS — Z6841 Body Mass Index (BMI) 40.0 and over, adult: Secondary | ICD-10-CM | POA: Diagnosis not present

## 2018-11-12 DIAGNOSIS — I2510 Atherosclerotic heart disease of native coronary artery without angina pectoris: Secondary | ICD-10-CM | POA: Diagnosis not present

## 2018-11-12 DIAGNOSIS — E1151 Type 2 diabetes mellitus with diabetic peripheral angiopathy without gangrene: Secondary | ICD-10-CM | POA: Diagnosis not present

## 2018-11-12 DIAGNOSIS — I87312 Chronic venous hypertension (idiopathic) with ulcer of left lower extremity: Secondary | ICD-10-CM | POA: Diagnosis not present

## 2018-11-12 DIAGNOSIS — N183 Chronic kidney disease, stage 3 (moderate): Secondary | ICD-10-CM | POA: Diagnosis not present

## 2018-11-12 DIAGNOSIS — Z1159 Encounter for screening for other viral diseases: Secondary | ICD-10-CM | POA: Diagnosis not present

## 2018-11-12 DIAGNOSIS — I87311 Chronic venous hypertension (idiopathic) with ulcer of right lower extremity: Secondary | ICD-10-CM | POA: Diagnosis not present

## 2018-11-12 DIAGNOSIS — E1142 Type 2 diabetes mellitus with diabetic polyneuropathy: Secondary | ICD-10-CM | POA: Diagnosis not present

## 2018-11-12 DIAGNOSIS — E1140 Type 2 diabetes mellitus with diabetic neuropathy, unspecified: Secondary | ICD-10-CM | POA: Diagnosis not present

## 2018-11-12 LAB — HEMOGLOBIN A1C: Hemoglobin A1C: 8

## 2018-11-16 DIAGNOSIS — E875 Hyperkalemia: Secondary | ICD-10-CM | POA: Diagnosis not present

## 2018-11-16 DIAGNOSIS — Z7901 Long term (current) use of anticoagulants: Secondary | ICD-10-CM | POA: Diagnosis not present

## 2018-11-16 DIAGNOSIS — I87313 Chronic venous hypertension (idiopathic) with ulcer of bilateral lower extremity: Secondary | ICD-10-CM | POA: Diagnosis not present

## 2018-11-16 DIAGNOSIS — Z6841 Body Mass Index (BMI) 40.0 and over, adult: Secondary | ICD-10-CM | POA: Diagnosis not present

## 2018-11-16 DIAGNOSIS — I87311 Chronic venous hypertension (idiopathic) with ulcer of right lower extremity: Secondary | ICD-10-CM | POA: Diagnosis not present

## 2018-11-16 DIAGNOSIS — I87312 Chronic venous hypertension (idiopathic) with ulcer of left lower extremity: Secondary | ICD-10-CM | POA: Diagnosis not present

## 2018-11-16 DIAGNOSIS — Z1159 Encounter for screening for other viral diseases: Secondary | ICD-10-CM | POA: Diagnosis not present

## 2018-11-19 DIAGNOSIS — I87313 Chronic venous hypertension (idiopathic) with ulcer of bilateral lower extremity: Secondary | ICD-10-CM | POA: Diagnosis not present

## 2018-11-19 DIAGNOSIS — Z6841 Body Mass Index (BMI) 40.0 and over, adult: Secondary | ICD-10-CM | POA: Diagnosis not present

## 2018-11-19 DIAGNOSIS — I87312 Chronic venous hypertension (idiopathic) with ulcer of left lower extremity: Secondary | ICD-10-CM | POA: Diagnosis not present

## 2018-11-19 DIAGNOSIS — Z1159 Encounter for screening for other viral diseases: Secondary | ICD-10-CM | POA: Diagnosis not present

## 2018-11-19 DIAGNOSIS — I87311 Chronic venous hypertension (idiopathic) with ulcer of right lower extremity: Secondary | ICD-10-CM | POA: Diagnosis not present

## 2018-11-23 DIAGNOSIS — I87313 Chronic venous hypertension (idiopathic) with ulcer of bilateral lower extremity: Secondary | ICD-10-CM | POA: Diagnosis not present

## 2018-11-23 DIAGNOSIS — Z6841 Body Mass Index (BMI) 40.0 and over, adult: Secondary | ICD-10-CM | POA: Diagnosis not present

## 2018-11-23 DIAGNOSIS — I87311 Chronic venous hypertension (idiopathic) with ulcer of right lower extremity: Secondary | ICD-10-CM | POA: Diagnosis not present

## 2018-11-23 DIAGNOSIS — Z1159 Encounter for screening for other viral diseases: Secondary | ICD-10-CM | POA: Diagnosis not present

## 2018-11-23 DIAGNOSIS — I10 Essential (primary) hypertension: Secondary | ICD-10-CM | POA: Diagnosis not present

## 2018-11-23 DIAGNOSIS — I87312 Chronic venous hypertension (idiopathic) with ulcer of left lower extremity: Secondary | ICD-10-CM | POA: Diagnosis not present

## 2018-11-26 DIAGNOSIS — I87313 Chronic venous hypertension (idiopathic) with ulcer of bilateral lower extremity: Secondary | ICD-10-CM | POA: Diagnosis not present

## 2018-11-26 DIAGNOSIS — Z6841 Body Mass Index (BMI) 40.0 and over, adult: Secondary | ICD-10-CM | POA: Diagnosis not present

## 2018-11-26 DIAGNOSIS — I87311 Chronic venous hypertension (idiopathic) with ulcer of right lower extremity: Secondary | ICD-10-CM | POA: Diagnosis not present

## 2018-11-26 DIAGNOSIS — I87312 Chronic venous hypertension (idiopathic) with ulcer of left lower extremity: Secondary | ICD-10-CM | POA: Diagnosis not present

## 2018-11-26 DIAGNOSIS — Z1159 Encounter for screening for other viral diseases: Secondary | ICD-10-CM | POA: Diagnosis not present

## 2018-11-26 DIAGNOSIS — I10 Essential (primary) hypertension: Secondary | ICD-10-CM | POA: Diagnosis not present

## 2018-12-07 DIAGNOSIS — J449 Chronic obstructive pulmonary disease, unspecified: Secondary | ICD-10-CM | POA: Diagnosis not present

## 2018-12-07 DIAGNOSIS — I87313 Chronic venous hypertension (idiopathic) with ulcer of bilateral lower extremity: Secondary | ICD-10-CM | POA: Diagnosis not present

## 2018-12-07 DIAGNOSIS — L97919 Non-pressure chronic ulcer of unspecified part of right lower leg with unspecified severity: Secondary | ICD-10-CM | POA: Diagnosis not present

## 2018-12-14 DIAGNOSIS — I87313 Chronic venous hypertension (idiopathic) with ulcer of bilateral lower extremity: Secondary | ICD-10-CM | POA: Diagnosis not present

## 2018-12-14 DIAGNOSIS — M75102 Unspecified rotator cuff tear or rupture of left shoulder, not specified as traumatic: Secondary | ICD-10-CM | POA: Diagnosis not present

## 2018-12-14 DIAGNOSIS — Z1159 Encounter for screening for other viral diseases: Secondary | ICD-10-CM | POA: Diagnosis not present

## 2018-12-21 DIAGNOSIS — Z1159 Encounter for screening for other viral diseases: Secondary | ICD-10-CM | POA: Diagnosis not present

## 2018-12-21 DIAGNOSIS — I87323 Chronic venous hypertension (idiopathic) with inflammation of bilateral lower extremity: Secondary | ICD-10-CM | POA: Diagnosis not present

## 2019-01-04 DIAGNOSIS — I87323 Chronic venous hypertension (idiopathic) with inflammation of bilateral lower extremity: Secondary | ICD-10-CM | POA: Diagnosis not present

## 2019-01-04 DIAGNOSIS — E1151 Type 2 diabetes mellitus with diabetic peripheral angiopathy without gangrene: Secondary | ICD-10-CM | POA: Diagnosis not present

## 2019-01-11 DIAGNOSIS — Z1159 Encounter for screening for other viral diseases: Secondary | ICD-10-CM | POA: Diagnosis not present

## 2019-01-11 DIAGNOSIS — I87323 Chronic venous hypertension (idiopathic) with inflammation of bilateral lower extremity: Secondary | ICD-10-CM | POA: Diagnosis not present

## 2019-01-11 DIAGNOSIS — J9601 Acute respiratory failure with hypoxia: Secondary | ICD-10-CM | POA: Diagnosis not present

## 2019-01-11 DIAGNOSIS — J449 Chronic obstructive pulmonary disease, unspecified: Secondary | ICD-10-CM | POA: Diagnosis not present

## 2019-01-11 DIAGNOSIS — Z7901 Long term (current) use of anticoagulants: Secondary | ICD-10-CM | POA: Diagnosis not present

## 2019-01-18 DIAGNOSIS — Z1159 Encounter for screening for other viral diseases: Secondary | ICD-10-CM | POA: Diagnosis not present

## 2019-01-18 DIAGNOSIS — I87323 Chronic venous hypertension (idiopathic) with inflammation of bilateral lower extremity: Secondary | ICD-10-CM | POA: Diagnosis not present

## 2019-01-18 DIAGNOSIS — J9611 Chronic respiratory failure with hypoxia: Secondary | ICD-10-CM | POA: Diagnosis not present

## 2019-01-18 DIAGNOSIS — Z6841 Body Mass Index (BMI) 40.0 and over, adult: Secondary | ICD-10-CM | POA: Diagnosis not present

## 2019-01-21 ENCOUNTER — Ambulatory Visit: Payer: Medicare HMO | Admitting: Internal Medicine

## 2019-01-25 DIAGNOSIS — I87311 Chronic venous hypertension (idiopathic) with ulcer of right lower extremity: Secondary | ICD-10-CM | POA: Diagnosis not present

## 2019-01-25 DIAGNOSIS — I87312 Chronic venous hypertension (idiopathic) with ulcer of left lower extremity: Secondary | ICD-10-CM | POA: Diagnosis not present

## 2019-02-05 DIAGNOSIS — N39498 Other specified urinary incontinence: Secondary | ICD-10-CM | POA: Diagnosis not present

## 2019-02-05 DIAGNOSIS — E1151 Type 2 diabetes mellitus with diabetic peripheral angiopathy without gangrene: Secondary | ICD-10-CM | POA: Diagnosis not present

## 2019-02-05 DIAGNOSIS — Z1159 Encounter for screening for other viral diseases: Secondary | ICD-10-CM | POA: Diagnosis not present

## 2019-02-05 DIAGNOSIS — I87323 Chronic venous hypertension (idiopathic) with inflammation of bilateral lower extremity: Secondary | ICD-10-CM | POA: Diagnosis not present

## 2019-02-07 DIAGNOSIS — I890 Lymphedema, not elsewhere classified: Secondary | ICD-10-CM | POA: Diagnosis not present

## 2019-02-07 DIAGNOSIS — J449 Chronic obstructive pulmonary disease, unspecified: Secondary | ICD-10-CM | POA: Diagnosis not present

## 2019-02-07 DIAGNOSIS — I2510 Atherosclerotic heart disease of native coronary artery without angina pectoris: Secondary | ICD-10-CM | POA: Diagnosis not present

## 2019-02-07 DIAGNOSIS — L97829 Non-pressure chronic ulcer of other part of left lower leg with unspecified severity: Secondary | ICD-10-CM | POA: Diagnosis not present

## 2019-02-07 DIAGNOSIS — N1832 Chronic kidney disease, stage 3b: Secondary | ICD-10-CM | POA: Diagnosis not present

## 2019-02-07 DIAGNOSIS — I1310 Hypertensive heart and chronic kidney disease without heart failure, with stage 1 through stage 4 chronic kidney disease, or unspecified chronic kidney disease: Secondary | ICD-10-CM | POA: Diagnosis not present

## 2019-02-07 DIAGNOSIS — L97819 Non-pressure chronic ulcer of other part of right lower leg with unspecified severity: Secondary | ICD-10-CM | POA: Diagnosis not present

## 2019-02-07 DIAGNOSIS — E1142 Type 2 diabetes mellitus with diabetic polyneuropathy: Secondary | ICD-10-CM | POA: Diagnosis not present

## 2019-02-07 DIAGNOSIS — I872 Venous insufficiency (chronic) (peripheral): Secondary | ICD-10-CM | POA: Diagnosis not present

## 2019-02-07 DIAGNOSIS — E1122 Type 2 diabetes mellitus with diabetic chronic kidney disease: Secondary | ICD-10-CM | POA: Diagnosis not present

## 2019-02-07 DIAGNOSIS — N183 Chronic kidney disease, stage 3 (moderate): Secondary | ICD-10-CM | POA: Diagnosis not present

## 2019-02-07 DIAGNOSIS — E1151 Type 2 diabetes mellitus with diabetic peripheral angiopathy without gangrene: Secondary | ICD-10-CM | POA: Diagnosis not present

## 2019-02-11 DIAGNOSIS — J9601 Acute respiratory failure with hypoxia: Secondary | ICD-10-CM | POA: Diagnosis not present

## 2019-02-11 DIAGNOSIS — E1142 Type 2 diabetes mellitus with diabetic polyneuropathy: Secondary | ICD-10-CM | POA: Diagnosis not present

## 2019-02-11 DIAGNOSIS — J449 Chronic obstructive pulmonary disease, unspecified: Secondary | ICD-10-CM | POA: Diagnosis not present

## 2019-02-11 DIAGNOSIS — N1832 Chronic kidney disease, stage 3b: Secondary | ICD-10-CM | POA: Diagnosis not present

## 2019-02-11 DIAGNOSIS — E1122 Type 2 diabetes mellitus with diabetic chronic kidney disease: Secondary | ICD-10-CM | POA: Diagnosis not present

## 2019-02-11 DIAGNOSIS — E1151 Type 2 diabetes mellitus with diabetic peripheral angiopathy without gangrene: Secondary | ICD-10-CM | POA: Diagnosis not present

## 2019-02-11 DIAGNOSIS — I2510 Atherosclerotic heart disease of native coronary artery without angina pectoris: Secondary | ICD-10-CM | POA: Diagnosis not present

## 2019-02-11 DIAGNOSIS — I1310 Hypertensive heart and chronic kidney disease without heart failure, with stage 1 through stage 4 chronic kidney disease, or unspecified chronic kidney disease: Secondary | ICD-10-CM | POA: Diagnosis not present

## 2019-02-11 DIAGNOSIS — L97829 Non-pressure chronic ulcer of other part of left lower leg with unspecified severity: Secondary | ICD-10-CM | POA: Diagnosis not present

## 2019-02-11 DIAGNOSIS — L97819 Non-pressure chronic ulcer of other part of right lower leg with unspecified severity: Secondary | ICD-10-CM | POA: Diagnosis not present

## 2019-02-14 DIAGNOSIS — I2510 Atherosclerotic heart disease of native coronary artery without angina pectoris: Secondary | ICD-10-CM | POA: Diagnosis not present

## 2019-02-14 DIAGNOSIS — E1151 Type 2 diabetes mellitus with diabetic peripheral angiopathy without gangrene: Secondary | ICD-10-CM | POA: Diagnosis not present

## 2019-02-14 DIAGNOSIS — N1832 Chronic kidney disease, stage 3b: Secondary | ICD-10-CM | POA: Diagnosis not present

## 2019-02-14 DIAGNOSIS — L97829 Non-pressure chronic ulcer of other part of left lower leg with unspecified severity: Secondary | ICD-10-CM | POA: Diagnosis not present

## 2019-02-14 DIAGNOSIS — I1310 Hypertensive heart and chronic kidney disease without heart failure, with stage 1 through stage 4 chronic kidney disease, or unspecified chronic kidney disease: Secondary | ICD-10-CM | POA: Diagnosis not present

## 2019-02-14 DIAGNOSIS — E1142 Type 2 diabetes mellitus with diabetic polyneuropathy: Secondary | ICD-10-CM | POA: Diagnosis not present

## 2019-02-14 DIAGNOSIS — E1122 Type 2 diabetes mellitus with diabetic chronic kidney disease: Secondary | ICD-10-CM | POA: Diagnosis not present

## 2019-02-14 DIAGNOSIS — L97819 Non-pressure chronic ulcer of other part of right lower leg with unspecified severity: Secondary | ICD-10-CM | POA: Diagnosis not present

## 2019-02-14 DIAGNOSIS — J449 Chronic obstructive pulmonary disease, unspecified: Secondary | ICD-10-CM | POA: Diagnosis not present

## 2019-02-18 DIAGNOSIS — N1832 Chronic kidney disease, stage 3b: Secondary | ICD-10-CM | POA: Diagnosis not present

## 2019-02-18 DIAGNOSIS — L97819 Non-pressure chronic ulcer of other part of right lower leg with unspecified severity: Secondary | ICD-10-CM | POA: Diagnosis not present

## 2019-02-18 DIAGNOSIS — N183 Chronic kidney disease, stage 3 (moderate): Secondary | ICD-10-CM | POA: Diagnosis not present

## 2019-02-18 DIAGNOSIS — I1310 Hypertensive heart and chronic kidney disease without heart failure, with stage 1 through stage 4 chronic kidney disease, or unspecified chronic kidney disease: Secondary | ICD-10-CM | POA: Diagnosis not present

## 2019-02-18 DIAGNOSIS — I2510 Atherosclerotic heart disease of native coronary artery without angina pectoris: Secondary | ICD-10-CM | POA: Diagnosis not present

## 2019-02-18 DIAGNOSIS — E1151 Type 2 diabetes mellitus with diabetic peripheral angiopathy without gangrene: Secondary | ICD-10-CM | POA: Diagnosis not present

## 2019-02-18 DIAGNOSIS — E1122 Type 2 diabetes mellitus with diabetic chronic kidney disease: Secondary | ICD-10-CM | POA: Diagnosis not present

## 2019-02-18 DIAGNOSIS — J449 Chronic obstructive pulmonary disease, unspecified: Secondary | ICD-10-CM | POA: Diagnosis not present

## 2019-02-18 DIAGNOSIS — L97829 Non-pressure chronic ulcer of other part of left lower leg with unspecified severity: Secondary | ICD-10-CM | POA: Diagnosis not present

## 2019-02-18 DIAGNOSIS — E1142 Type 2 diabetes mellitus with diabetic polyneuropathy: Secondary | ICD-10-CM | POA: Diagnosis not present

## 2019-02-22 DIAGNOSIS — N1832 Chronic kidney disease, stage 3b: Secondary | ICD-10-CM | POA: Diagnosis not present

## 2019-02-22 DIAGNOSIS — I2510 Atherosclerotic heart disease of native coronary artery without angina pectoris: Secondary | ICD-10-CM | POA: Diagnosis not present

## 2019-02-22 DIAGNOSIS — E1142 Type 2 diabetes mellitus with diabetic polyneuropathy: Secondary | ICD-10-CM | POA: Diagnosis not present

## 2019-02-22 DIAGNOSIS — L97819 Non-pressure chronic ulcer of other part of right lower leg with unspecified severity: Secondary | ICD-10-CM | POA: Diagnosis not present

## 2019-02-22 DIAGNOSIS — J449 Chronic obstructive pulmonary disease, unspecified: Secondary | ICD-10-CM | POA: Diagnosis not present

## 2019-02-22 DIAGNOSIS — I1310 Hypertensive heart and chronic kidney disease without heart failure, with stage 1 through stage 4 chronic kidney disease, or unspecified chronic kidney disease: Secondary | ICD-10-CM | POA: Diagnosis not present

## 2019-02-22 DIAGNOSIS — E1122 Type 2 diabetes mellitus with diabetic chronic kidney disease: Secondary | ICD-10-CM | POA: Diagnosis not present

## 2019-02-22 DIAGNOSIS — E1151 Type 2 diabetes mellitus with diabetic peripheral angiopathy without gangrene: Secondary | ICD-10-CM | POA: Diagnosis not present

## 2019-02-22 DIAGNOSIS — L97829 Non-pressure chronic ulcer of other part of left lower leg with unspecified severity: Secondary | ICD-10-CM | POA: Diagnosis not present

## 2019-02-25 DIAGNOSIS — E1142 Type 2 diabetes mellitus with diabetic polyneuropathy: Secondary | ICD-10-CM | POA: Diagnosis not present

## 2019-02-25 DIAGNOSIS — E1122 Type 2 diabetes mellitus with diabetic chronic kidney disease: Secondary | ICD-10-CM | POA: Diagnosis not present

## 2019-02-25 DIAGNOSIS — L97829 Non-pressure chronic ulcer of other part of left lower leg with unspecified severity: Secondary | ICD-10-CM | POA: Diagnosis not present

## 2019-02-25 DIAGNOSIS — J449 Chronic obstructive pulmonary disease, unspecified: Secondary | ICD-10-CM | POA: Diagnosis not present

## 2019-02-25 DIAGNOSIS — E1151 Type 2 diabetes mellitus with diabetic peripheral angiopathy without gangrene: Secondary | ICD-10-CM | POA: Diagnosis not present

## 2019-02-25 DIAGNOSIS — N1832 Chronic kidney disease, stage 3b: Secondary | ICD-10-CM | POA: Diagnosis not present

## 2019-02-25 DIAGNOSIS — L97819 Non-pressure chronic ulcer of other part of right lower leg with unspecified severity: Secondary | ICD-10-CM | POA: Diagnosis not present

## 2019-02-25 DIAGNOSIS — I2510 Atherosclerotic heart disease of native coronary artery without angina pectoris: Secondary | ICD-10-CM | POA: Diagnosis not present

## 2019-02-25 DIAGNOSIS — I1310 Hypertensive heart and chronic kidney disease without heart failure, with stage 1 through stage 4 chronic kidney disease, or unspecified chronic kidney disease: Secondary | ICD-10-CM | POA: Diagnosis not present

## 2019-03-01 DIAGNOSIS — E1142 Type 2 diabetes mellitus with diabetic polyneuropathy: Secondary | ICD-10-CM | POA: Diagnosis not present

## 2019-03-01 DIAGNOSIS — E1122 Type 2 diabetes mellitus with diabetic chronic kidney disease: Secondary | ICD-10-CM | POA: Diagnosis not present

## 2019-03-01 DIAGNOSIS — J449 Chronic obstructive pulmonary disease, unspecified: Secondary | ICD-10-CM | POA: Diagnosis not present

## 2019-03-01 DIAGNOSIS — L97819 Non-pressure chronic ulcer of other part of right lower leg with unspecified severity: Secondary | ICD-10-CM | POA: Diagnosis not present

## 2019-03-01 DIAGNOSIS — I2510 Atherosclerotic heart disease of native coronary artery without angina pectoris: Secondary | ICD-10-CM | POA: Diagnosis not present

## 2019-03-01 DIAGNOSIS — N1832 Chronic kidney disease, stage 3b: Secondary | ICD-10-CM | POA: Diagnosis not present

## 2019-03-01 DIAGNOSIS — E1151 Type 2 diabetes mellitus with diabetic peripheral angiopathy without gangrene: Secondary | ICD-10-CM | POA: Diagnosis not present

## 2019-03-01 DIAGNOSIS — I1310 Hypertensive heart and chronic kidney disease without heart failure, with stage 1 through stage 4 chronic kidney disease, or unspecified chronic kidney disease: Secondary | ICD-10-CM | POA: Diagnosis not present

## 2019-03-01 DIAGNOSIS — L97829 Non-pressure chronic ulcer of other part of left lower leg with unspecified severity: Secondary | ICD-10-CM | POA: Diagnosis not present

## 2019-03-04 DIAGNOSIS — N1832 Chronic kidney disease, stage 3b: Secondary | ICD-10-CM | POA: Diagnosis not present

## 2019-03-04 DIAGNOSIS — J449 Chronic obstructive pulmonary disease, unspecified: Secondary | ICD-10-CM | POA: Diagnosis not present

## 2019-03-04 DIAGNOSIS — E1122 Type 2 diabetes mellitus with diabetic chronic kidney disease: Secondary | ICD-10-CM | POA: Diagnosis not present

## 2019-03-04 DIAGNOSIS — L97829 Non-pressure chronic ulcer of other part of left lower leg with unspecified severity: Secondary | ICD-10-CM | POA: Diagnosis not present

## 2019-03-04 DIAGNOSIS — E1142 Type 2 diabetes mellitus with diabetic polyneuropathy: Secondary | ICD-10-CM | POA: Diagnosis not present

## 2019-03-04 DIAGNOSIS — E1151 Type 2 diabetes mellitus with diabetic peripheral angiopathy without gangrene: Secondary | ICD-10-CM | POA: Diagnosis not present

## 2019-03-04 DIAGNOSIS — I2510 Atherosclerotic heart disease of native coronary artery without angina pectoris: Secondary | ICD-10-CM | POA: Diagnosis not present

## 2019-03-04 DIAGNOSIS — L97819 Non-pressure chronic ulcer of other part of right lower leg with unspecified severity: Secondary | ICD-10-CM | POA: Diagnosis not present

## 2019-03-04 DIAGNOSIS — I1310 Hypertensive heart and chronic kidney disease without heart failure, with stage 1 through stage 4 chronic kidney disease, or unspecified chronic kidney disease: Secondary | ICD-10-CM | POA: Diagnosis not present

## 2019-03-08 DIAGNOSIS — E1142 Type 2 diabetes mellitus with diabetic polyneuropathy: Secondary | ICD-10-CM | POA: Diagnosis not present

## 2019-03-08 DIAGNOSIS — E1122 Type 2 diabetes mellitus with diabetic chronic kidney disease: Secondary | ICD-10-CM | POA: Diagnosis not present

## 2019-03-08 DIAGNOSIS — I2510 Atherosclerotic heart disease of native coronary artery without angina pectoris: Secondary | ICD-10-CM | POA: Diagnosis not present

## 2019-03-08 DIAGNOSIS — N1832 Chronic kidney disease, stage 3b: Secondary | ICD-10-CM | POA: Diagnosis not present

## 2019-03-08 DIAGNOSIS — E1151 Type 2 diabetes mellitus with diabetic peripheral angiopathy without gangrene: Secondary | ICD-10-CM | POA: Diagnosis not present

## 2019-03-08 DIAGNOSIS — J449 Chronic obstructive pulmonary disease, unspecified: Secondary | ICD-10-CM | POA: Diagnosis not present

## 2019-03-08 DIAGNOSIS — L97829 Non-pressure chronic ulcer of other part of left lower leg with unspecified severity: Secondary | ICD-10-CM | POA: Diagnosis not present

## 2019-03-08 DIAGNOSIS — I1310 Hypertensive heart and chronic kidney disease without heart failure, with stage 1 through stage 4 chronic kidney disease, or unspecified chronic kidney disease: Secondary | ICD-10-CM | POA: Diagnosis not present

## 2019-03-08 DIAGNOSIS — L97819 Non-pressure chronic ulcer of other part of right lower leg with unspecified severity: Secondary | ICD-10-CM | POA: Diagnosis not present

## 2019-03-11 DIAGNOSIS — L97819 Non-pressure chronic ulcer of other part of right lower leg with unspecified severity: Secondary | ICD-10-CM | POA: Diagnosis not present

## 2019-03-11 DIAGNOSIS — I2510 Atherosclerotic heart disease of native coronary artery without angina pectoris: Secondary | ICD-10-CM | POA: Diagnosis not present

## 2019-03-11 DIAGNOSIS — L97829 Non-pressure chronic ulcer of other part of left lower leg with unspecified severity: Secondary | ICD-10-CM | POA: Diagnosis not present

## 2019-03-11 DIAGNOSIS — E1142 Type 2 diabetes mellitus with diabetic polyneuropathy: Secondary | ICD-10-CM | POA: Diagnosis not present

## 2019-03-11 DIAGNOSIS — N1832 Chronic kidney disease, stage 3b: Secondary | ICD-10-CM | POA: Diagnosis not present

## 2019-03-11 DIAGNOSIS — E1122 Type 2 diabetes mellitus with diabetic chronic kidney disease: Secondary | ICD-10-CM | POA: Diagnosis not present

## 2019-03-11 DIAGNOSIS — I890 Lymphedema, not elsewhere classified: Secondary | ICD-10-CM | POA: Diagnosis not present

## 2019-03-11 DIAGNOSIS — E1151 Type 2 diabetes mellitus with diabetic peripheral angiopathy without gangrene: Secondary | ICD-10-CM | POA: Diagnosis not present

## 2019-03-11 DIAGNOSIS — J449 Chronic obstructive pulmonary disease, unspecified: Secondary | ICD-10-CM | POA: Diagnosis not present

## 2019-03-11 DIAGNOSIS — I1310 Hypertensive heart and chronic kidney disease without heart failure, with stage 1 through stage 4 chronic kidney disease, or unspecified chronic kidney disease: Secondary | ICD-10-CM | POA: Diagnosis not present

## 2019-03-13 DIAGNOSIS — J449 Chronic obstructive pulmonary disease, unspecified: Secondary | ICD-10-CM | POA: Diagnosis not present

## 2019-03-13 DIAGNOSIS — J9601 Acute respiratory failure with hypoxia: Secondary | ICD-10-CM | POA: Diagnosis not present

## 2019-03-15 DIAGNOSIS — E1151 Type 2 diabetes mellitus with diabetic peripheral angiopathy without gangrene: Secondary | ICD-10-CM | POA: Diagnosis not present

## 2019-03-15 DIAGNOSIS — I1310 Hypertensive heart and chronic kidney disease without heart failure, with stage 1 through stage 4 chronic kidney disease, or unspecified chronic kidney disease: Secondary | ICD-10-CM | POA: Diagnosis not present

## 2019-03-15 DIAGNOSIS — E1122 Type 2 diabetes mellitus with diabetic chronic kidney disease: Secondary | ICD-10-CM | POA: Diagnosis not present

## 2019-03-15 DIAGNOSIS — J449 Chronic obstructive pulmonary disease, unspecified: Secondary | ICD-10-CM | POA: Diagnosis not present

## 2019-03-15 DIAGNOSIS — L97829 Non-pressure chronic ulcer of other part of left lower leg with unspecified severity: Secondary | ICD-10-CM | POA: Diagnosis not present

## 2019-03-15 DIAGNOSIS — I890 Lymphedema, not elsewhere classified: Secondary | ICD-10-CM | POA: Diagnosis not present

## 2019-03-15 DIAGNOSIS — I2510 Atherosclerotic heart disease of native coronary artery without angina pectoris: Secondary | ICD-10-CM | POA: Diagnosis not present

## 2019-03-15 DIAGNOSIS — N1832 Chronic kidney disease, stage 3b: Secondary | ICD-10-CM | POA: Diagnosis not present

## 2019-03-15 DIAGNOSIS — L97819 Non-pressure chronic ulcer of other part of right lower leg with unspecified severity: Secondary | ICD-10-CM | POA: Diagnosis not present

## 2019-03-18 DIAGNOSIS — I1310 Hypertensive heart and chronic kidney disease without heart failure, with stage 1 through stage 4 chronic kidney disease, or unspecified chronic kidney disease: Secondary | ICD-10-CM | POA: Diagnosis not present

## 2019-03-18 DIAGNOSIS — E1151 Type 2 diabetes mellitus with diabetic peripheral angiopathy without gangrene: Secondary | ICD-10-CM | POA: Diagnosis not present

## 2019-03-18 DIAGNOSIS — L97829 Non-pressure chronic ulcer of other part of left lower leg with unspecified severity: Secondary | ICD-10-CM | POA: Diagnosis not present

## 2019-03-18 DIAGNOSIS — E1122 Type 2 diabetes mellitus with diabetic chronic kidney disease: Secondary | ICD-10-CM | POA: Diagnosis not present

## 2019-03-18 DIAGNOSIS — J449 Chronic obstructive pulmonary disease, unspecified: Secondary | ICD-10-CM | POA: Diagnosis not present

## 2019-03-18 DIAGNOSIS — N1832 Chronic kidney disease, stage 3b: Secondary | ICD-10-CM | POA: Diagnosis not present

## 2019-03-18 DIAGNOSIS — L97819 Non-pressure chronic ulcer of other part of right lower leg with unspecified severity: Secondary | ICD-10-CM | POA: Diagnosis not present

## 2019-03-18 DIAGNOSIS — I890 Lymphedema, not elsewhere classified: Secondary | ICD-10-CM | POA: Diagnosis not present

## 2019-03-18 DIAGNOSIS — I2510 Atherosclerotic heart disease of native coronary artery without angina pectoris: Secondary | ICD-10-CM | POA: Diagnosis not present

## 2019-03-25 DIAGNOSIS — I2510 Atherosclerotic heart disease of native coronary artery without angina pectoris: Secondary | ICD-10-CM | POA: Diagnosis not present

## 2019-03-25 DIAGNOSIS — I890 Lymphedema, not elsewhere classified: Secondary | ICD-10-CM | POA: Diagnosis not present

## 2019-03-25 DIAGNOSIS — L97819 Non-pressure chronic ulcer of other part of right lower leg with unspecified severity: Secondary | ICD-10-CM | POA: Diagnosis not present

## 2019-03-25 DIAGNOSIS — E1122 Type 2 diabetes mellitus with diabetic chronic kidney disease: Secondary | ICD-10-CM | POA: Diagnosis not present

## 2019-03-25 DIAGNOSIS — L97829 Non-pressure chronic ulcer of other part of left lower leg with unspecified severity: Secondary | ICD-10-CM | POA: Diagnosis not present

## 2019-03-25 DIAGNOSIS — E1151 Type 2 diabetes mellitus with diabetic peripheral angiopathy without gangrene: Secondary | ICD-10-CM | POA: Diagnosis not present

## 2019-03-25 DIAGNOSIS — N1832 Chronic kidney disease, stage 3b: Secondary | ICD-10-CM | POA: Diagnosis not present

## 2019-03-25 DIAGNOSIS — I1310 Hypertensive heart and chronic kidney disease without heart failure, with stage 1 through stage 4 chronic kidney disease, or unspecified chronic kidney disease: Secondary | ICD-10-CM | POA: Diagnosis not present

## 2019-03-25 DIAGNOSIS — J449 Chronic obstructive pulmonary disease, unspecified: Secondary | ICD-10-CM | POA: Diagnosis not present

## 2019-04-01 DIAGNOSIS — J449 Chronic obstructive pulmonary disease, unspecified: Secondary | ICD-10-CM | POA: Diagnosis not present

## 2019-04-01 DIAGNOSIS — L97829 Non-pressure chronic ulcer of other part of left lower leg with unspecified severity: Secondary | ICD-10-CM | POA: Diagnosis not present

## 2019-04-01 DIAGNOSIS — L97819 Non-pressure chronic ulcer of other part of right lower leg with unspecified severity: Secondary | ICD-10-CM | POA: Diagnosis not present

## 2019-04-01 DIAGNOSIS — I890 Lymphedema, not elsewhere classified: Secondary | ICD-10-CM | POA: Diagnosis not present

## 2019-04-01 DIAGNOSIS — I2510 Atherosclerotic heart disease of native coronary artery without angina pectoris: Secondary | ICD-10-CM | POA: Diagnosis not present

## 2019-04-01 DIAGNOSIS — N1832 Chronic kidney disease, stage 3b: Secondary | ICD-10-CM | POA: Diagnosis not present

## 2019-04-01 DIAGNOSIS — E1122 Type 2 diabetes mellitus with diabetic chronic kidney disease: Secondary | ICD-10-CM | POA: Diagnosis not present

## 2019-04-01 DIAGNOSIS — I1310 Hypertensive heart and chronic kidney disease without heart failure, with stage 1 through stage 4 chronic kidney disease, or unspecified chronic kidney disease: Secondary | ICD-10-CM | POA: Diagnosis not present

## 2019-04-01 DIAGNOSIS — E1151 Type 2 diabetes mellitus with diabetic peripheral angiopathy without gangrene: Secondary | ICD-10-CM | POA: Diagnosis not present

## 2019-04-08 DIAGNOSIS — E1151 Type 2 diabetes mellitus with diabetic peripheral angiopathy without gangrene: Secondary | ICD-10-CM | POA: Diagnosis not present

## 2019-04-08 DIAGNOSIS — N1832 Chronic kidney disease, stage 3b: Secondary | ICD-10-CM | POA: Diagnosis not present

## 2019-04-08 DIAGNOSIS — L97819 Non-pressure chronic ulcer of other part of right lower leg with unspecified severity: Secondary | ICD-10-CM | POA: Diagnosis not present

## 2019-04-08 DIAGNOSIS — I890 Lymphedema, not elsewhere classified: Secondary | ICD-10-CM | POA: Diagnosis not present

## 2019-04-08 DIAGNOSIS — E1122 Type 2 diabetes mellitus with diabetic chronic kidney disease: Secondary | ICD-10-CM | POA: Diagnosis not present

## 2019-04-08 DIAGNOSIS — L97829 Non-pressure chronic ulcer of other part of left lower leg with unspecified severity: Secondary | ICD-10-CM | POA: Diagnosis not present

## 2019-04-08 DIAGNOSIS — J449 Chronic obstructive pulmonary disease, unspecified: Secondary | ICD-10-CM | POA: Diagnosis not present

## 2019-04-08 DIAGNOSIS — I2510 Atherosclerotic heart disease of native coronary artery without angina pectoris: Secondary | ICD-10-CM | POA: Diagnosis not present

## 2019-04-08 DIAGNOSIS — I1310 Hypertensive heart and chronic kidney disease without heart failure, with stage 1 through stage 4 chronic kidney disease, or unspecified chronic kidney disease: Secondary | ICD-10-CM | POA: Diagnosis not present

## 2019-04-12 DIAGNOSIS — I2510 Atherosclerotic heart disease of native coronary artery without angina pectoris: Secondary | ICD-10-CM | POA: Diagnosis not present

## 2019-04-12 DIAGNOSIS — N1832 Chronic kidney disease, stage 3b: Secondary | ICD-10-CM | POA: Diagnosis not present

## 2019-04-12 DIAGNOSIS — E1122 Type 2 diabetes mellitus with diabetic chronic kidney disease: Secondary | ICD-10-CM | POA: Diagnosis not present

## 2019-04-12 DIAGNOSIS — L97829 Non-pressure chronic ulcer of other part of left lower leg with unspecified severity: Secondary | ICD-10-CM | POA: Diagnosis not present

## 2019-04-12 DIAGNOSIS — I1310 Hypertensive heart and chronic kidney disease without heart failure, with stage 1 through stage 4 chronic kidney disease, or unspecified chronic kidney disease: Secondary | ICD-10-CM | POA: Diagnosis not present

## 2019-04-12 DIAGNOSIS — J449 Chronic obstructive pulmonary disease, unspecified: Secondary | ICD-10-CM | POA: Diagnosis not present

## 2019-04-12 DIAGNOSIS — E1151 Type 2 diabetes mellitus with diabetic peripheral angiopathy without gangrene: Secondary | ICD-10-CM | POA: Diagnosis not present

## 2019-04-12 DIAGNOSIS — L97819 Non-pressure chronic ulcer of other part of right lower leg with unspecified severity: Secondary | ICD-10-CM | POA: Diagnosis not present

## 2019-04-12 DIAGNOSIS — I890 Lymphedema, not elsewhere classified: Secondary | ICD-10-CM | POA: Diagnosis not present

## 2019-04-13 DIAGNOSIS — J449 Chronic obstructive pulmonary disease, unspecified: Secondary | ICD-10-CM | POA: Diagnosis not present

## 2019-04-13 DIAGNOSIS — J9601 Acute respiratory failure with hypoxia: Secondary | ICD-10-CM | POA: Diagnosis not present

## 2019-04-22 DIAGNOSIS — I2510 Atherosclerotic heart disease of native coronary artery without angina pectoris: Secondary | ICD-10-CM | POA: Diagnosis not present

## 2019-04-22 DIAGNOSIS — J449 Chronic obstructive pulmonary disease, unspecified: Secondary | ICD-10-CM | POA: Diagnosis not present

## 2019-04-22 DIAGNOSIS — N1832 Chronic kidney disease, stage 3b: Secondary | ICD-10-CM | POA: Diagnosis not present

## 2019-04-22 DIAGNOSIS — L97829 Non-pressure chronic ulcer of other part of left lower leg with unspecified severity: Secondary | ICD-10-CM | POA: Diagnosis not present

## 2019-04-22 DIAGNOSIS — I890 Lymphedema, not elsewhere classified: Secondary | ICD-10-CM | POA: Diagnosis not present

## 2019-04-22 DIAGNOSIS — L97819 Non-pressure chronic ulcer of other part of right lower leg with unspecified severity: Secondary | ICD-10-CM | POA: Diagnosis not present

## 2019-04-22 DIAGNOSIS — E1122 Type 2 diabetes mellitus with diabetic chronic kidney disease: Secondary | ICD-10-CM | POA: Diagnosis not present

## 2019-04-22 DIAGNOSIS — I1310 Hypertensive heart and chronic kidney disease without heart failure, with stage 1 through stage 4 chronic kidney disease, or unspecified chronic kidney disease: Secondary | ICD-10-CM | POA: Diagnosis not present

## 2019-04-22 DIAGNOSIS — E1151 Type 2 diabetes mellitus with diabetic peripheral angiopathy without gangrene: Secondary | ICD-10-CM | POA: Diagnosis not present

## 2019-04-29 DIAGNOSIS — J449 Chronic obstructive pulmonary disease, unspecified: Secondary | ICD-10-CM | POA: Diagnosis not present

## 2019-04-29 DIAGNOSIS — L97819 Non-pressure chronic ulcer of other part of right lower leg with unspecified severity: Secondary | ICD-10-CM | POA: Diagnosis not present

## 2019-04-29 DIAGNOSIS — N1832 Chronic kidney disease, stage 3b: Secondary | ICD-10-CM | POA: Diagnosis not present

## 2019-04-29 DIAGNOSIS — E1151 Type 2 diabetes mellitus with diabetic peripheral angiopathy without gangrene: Secondary | ICD-10-CM | POA: Diagnosis not present

## 2019-04-29 DIAGNOSIS — E1122 Type 2 diabetes mellitus with diabetic chronic kidney disease: Secondary | ICD-10-CM | POA: Diagnosis not present

## 2019-04-29 DIAGNOSIS — L97829 Non-pressure chronic ulcer of other part of left lower leg with unspecified severity: Secondary | ICD-10-CM | POA: Diagnosis not present

## 2019-04-29 DIAGNOSIS — I1310 Hypertensive heart and chronic kidney disease without heart failure, with stage 1 through stage 4 chronic kidney disease, or unspecified chronic kidney disease: Secondary | ICD-10-CM | POA: Diagnosis not present

## 2019-04-29 DIAGNOSIS — I890 Lymphedema, not elsewhere classified: Secondary | ICD-10-CM | POA: Diagnosis not present

## 2019-04-29 DIAGNOSIS — I2510 Atherosclerotic heart disease of native coronary artery without angina pectoris: Secondary | ICD-10-CM | POA: Diagnosis not present

## 2019-05-02 DIAGNOSIS — E1122 Type 2 diabetes mellitus with diabetic chronic kidney disease: Secondary | ICD-10-CM | POA: Diagnosis not present

## 2019-05-02 DIAGNOSIS — E1151 Type 2 diabetes mellitus with diabetic peripheral angiopathy without gangrene: Secondary | ICD-10-CM | POA: Diagnosis not present

## 2019-05-02 DIAGNOSIS — N1832 Chronic kidney disease, stage 3b: Secondary | ICD-10-CM | POA: Diagnosis not present

## 2019-05-02 DIAGNOSIS — I1310 Hypertensive heart and chronic kidney disease without heart failure, with stage 1 through stage 4 chronic kidney disease, or unspecified chronic kidney disease: Secondary | ICD-10-CM | POA: Diagnosis not present

## 2019-05-02 DIAGNOSIS — I890 Lymphedema, not elsewhere classified: Secondary | ICD-10-CM | POA: Diagnosis not present

## 2019-05-02 DIAGNOSIS — J449 Chronic obstructive pulmonary disease, unspecified: Secondary | ICD-10-CM | POA: Diagnosis not present

## 2019-05-02 DIAGNOSIS — L97819 Non-pressure chronic ulcer of other part of right lower leg with unspecified severity: Secondary | ICD-10-CM | POA: Diagnosis not present

## 2019-05-02 DIAGNOSIS — I2510 Atherosclerotic heart disease of native coronary artery without angina pectoris: Secondary | ICD-10-CM | POA: Diagnosis not present

## 2019-05-02 DIAGNOSIS — L97829 Non-pressure chronic ulcer of other part of left lower leg with unspecified severity: Secondary | ICD-10-CM | POA: Diagnosis not present

## 2019-05-08 DIAGNOSIS — I890 Lymphedema, not elsewhere classified: Secondary | ICD-10-CM | POA: Diagnosis not present

## 2019-05-08 DIAGNOSIS — E1151 Type 2 diabetes mellitus with diabetic peripheral angiopathy without gangrene: Secondary | ICD-10-CM | POA: Diagnosis not present

## 2019-05-08 DIAGNOSIS — L97819 Non-pressure chronic ulcer of other part of right lower leg with unspecified severity: Secondary | ICD-10-CM | POA: Diagnosis not present

## 2019-05-08 DIAGNOSIS — L97829 Non-pressure chronic ulcer of other part of left lower leg with unspecified severity: Secondary | ICD-10-CM | POA: Diagnosis not present

## 2019-05-08 DIAGNOSIS — E1122 Type 2 diabetes mellitus with diabetic chronic kidney disease: Secondary | ICD-10-CM | POA: Diagnosis not present

## 2019-05-08 DIAGNOSIS — J449 Chronic obstructive pulmonary disease, unspecified: Secondary | ICD-10-CM | POA: Diagnosis not present

## 2019-05-08 DIAGNOSIS — I1310 Hypertensive heart and chronic kidney disease without heart failure, with stage 1 through stage 4 chronic kidney disease, or unspecified chronic kidney disease: Secondary | ICD-10-CM | POA: Diagnosis not present

## 2019-05-08 DIAGNOSIS — I2510 Atherosclerotic heart disease of native coronary artery without angina pectoris: Secondary | ICD-10-CM | POA: Diagnosis not present

## 2019-05-08 DIAGNOSIS — N1832 Chronic kidney disease, stage 3b: Secondary | ICD-10-CM | POA: Diagnosis not present

## 2019-05-11 DIAGNOSIS — I1310 Hypertensive heart and chronic kidney disease without heart failure, with stage 1 through stage 4 chronic kidney disease, or unspecified chronic kidney disease: Secondary | ICD-10-CM | POA: Diagnosis not present

## 2019-05-11 DIAGNOSIS — I890 Lymphedema, not elsewhere classified: Secondary | ICD-10-CM | POA: Diagnosis not present

## 2019-05-13 DIAGNOSIS — N1832 Chronic kidney disease, stage 3b: Secondary | ICD-10-CM | POA: Diagnosis not present

## 2019-05-13 DIAGNOSIS — I1310 Hypertensive heart and chronic kidney disease without heart failure, with stage 1 through stage 4 chronic kidney disease, or unspecified chronic kidney disease: Secondary | ICD-10-CM | POA: Diagnosis not present

## 2019-05-13 DIAGNOSIS — J449 Chronic obstructive pulmonary disease, unspecified: Secondary | ICD-10-CM | POA: Diagnosis not present

## 2019-05-13 DIAGNOSIS — E1122 Type 2 diabetes mellitus with diabetic chronic kidney disease: Secondary | ICD-10-CM | POA: Diagnosis not present

## 2019-05-13 DIAGNOSIS — L97829 Non-pressure chronic ulcer of other part of left lower leg with unspecified severity: Secondary | ICD-10-CM | POA: Diagnosis not present

## 2019-05-13 DIAGNOSIS — L97819 Non-pressure chronic ulcer of other part of right lower leg with unspecified severity: Secondary | ICD-10-CM | POA: Diagnosis not present

## 2019-05-13 DIAGNOSIS — J9601 Acute respiratory failure with hypoxia: Secondary | ICD-10-CM | POA: Diagnosis not present

## 2019-05-13 DIAGNOSIS — E1151 Type 2 diabetes mellitus with diabetic peripheral angiopathy without gangrene: Secondary | ICD-10-CM | POA: Diagnosis not present

## 2019-05-13 DIAGNOSIS — I890 Lymphedema, not elsewhere classified: Secondary | ICD-10-CM | POA: Diagnosis not present

## 2019-05-13 DIAGNOSIS — I2510 Atherosclerotic heart disease of native coronary artery without angina pectoris: Secondary | ICD-10-CM | POA: Diagnosis not present

## 2019-05-15 DIAGNOSIS — E1122 Type 2 diabetes mellitus with diabetic chronic kidney disease: Secondary | ICD-10-CM | POA: Diagnosis not present

## 2019-05-15 DIAGNOSIS — L97829 Non-pressure chronic ulcer of other part of left lower leg with unspecified severity: Secondary | ICD-10-CM | POA: Diagnosis not present

## 2019-05-15 DIAGNOSIS — N1832 Chronic kidney disease, stage 3b: Secondary | ICD-10-CM | POA: Diagnosis not present

## 2019-05-15 DIAGNOSIS — I1310 Hypertensive heart and chronic kidney disease without heart failure, with stage 1 through stage 4 chronic kidney disease, or unspecified chronic kidney disease: Secondary | ICD-10-CM | POA: Diagnosis not present

## 2019-05-15 DIAGNOSIS — L97819 Non-pressure chronic ulcer of other part of right lower leg with unspecified severity: Secondary | ICD-10-CM | POA: Diagnosis not present

## 2019-05-15 DIAGNOSIS — E1151 Type 2 diabetes mellitus with diabetic peripheral angiopathy without gangrene: Secondary | ICD-10-CM | POA: Diagnosis not present

## 2019-05-15 DIAGNOSIS — J449 Chronic obstructive pulmonary disease, unspecified: Secondary | ICD-10-CM | POA: Diagnosis not present

## 2019-05-15 DIAGNOSIS — I2510 Atherosclerotic heart disease of native coronary artery without angina pectoris: Secondary | ICD-10-CM | POA: Diagnosis not present

## 2019-05-15 DIAGNOSIS — I890 Lymphedema, not elsewhere classified: Secondary | ICD-10-CM | POA: Diagnosis not present

## 2019-05-17 DIAGNOSIS — I1310 Hypertensive heart and chronic kidney disease without heart failure, with stage 1 through stage 4 chronic kidney disease, or unspecified chronic kidney disease: Secondary | ICD-10-CM | POA: Diagnosis not present

## 2019-05-17 DIAGNOSIS — L97829 Non-pressure chronic ulcer of other part of left lower leg with unspecified severity: Secondary | ICD-10-CM | POA: Diagnosis not present

## 2019-05-17 DIAGNOSIS — I2510 Atherosclerotic heart disease of native coronary artery without angina pectoris: Secondary | ICD-10-CM | POA: Diagnosis not present

## 2019-05-17 DIAGNOSIS — E1122 Type 2 diabetes mellitus with diabetic chronic kidney disease: Secondary | ICD-10-CM | POA: Diagnosis not present

## 2019-05-17 DIAGNOSIS — J449 Chronic obstructive pulmonary disease, unspecified: Secondary | ICD-10-CM | POA: Diagnosis not present

## 2019-05-17 DIAGNOSIS — I890 Lymphedema, not elsewhere classified: Secondary | ICD-10-CM | POA: Diagnosis not present

## 2019-05-17 DIAGNOSIS — L97819 Non-pressure chronic ulcer of other part of right lower leg with unspecified severity: Secondary | ICD-10-CM | POA: Diagnosis not present

## 2019-05-17 DIAGNOSIS — N1832 Chronic kidney disease, stage 3b: Secondary | ICD-10-CM | POA: Diagnosis not present

## 2019-05-17 DIAGNOSIS — E1151 Type 2 diabetes mellitus with diabetic peripheral angiopathy without gangrene: Secondary | ICD-10-CM | POA: Diagnosis not present

## 2019-05-20 DIAGNOSIS — J449 Chronic obstructive pulmonary disease, unspecified: Secondary | ICD-10-CM | POA: Diagnosis not present

## 2019-05-20 DIAGNOSIS — E1151 Type 2 diabetes mellitus with diabetic peripheral angiopathy without gangrene: Secondary | ICD-10-CM | POA: Diagnosis not present

## 2019-05-20 DIAGNOSIS — L97819 Non-pressure chronic ulcer of other part of right lower leg with unspecified severity: Secondary | ICD-10-CM | POA: Diagnosis not present

## 2019-05-20 DIAGNOSIS — E1122 Type 2 diabetes mellitus with diabetic chronic kidney disease: Secondary | ICD-10-CM | POA: Diagnosis not present

## 2019-05-20 DIAGNOSIS — N1832 Chronic kidney disease, stage 3b: Secondary | ICD-10-CM | POA: Diagnosis not present

## 2019-05-20 DIAGNOSIS — I2510 Atherosclerotic heart disease of native coronary artery without angina pectoris: Secondary | ICD-10-CM | POA: Diagnosis not present

## 2019-05-20 DIAGNOSIS — L97829 Non-pressure chronic ulcer of other part of left lower leg with unspecified severity: Secondary | ICD-10-CM | POA: Diagnosis not present

## 2019-05-20 DIAGNOSIS — I890 Lymphedema, not elsewhere classified: Secondary | ICD-10-CM | POA: Diagnosis not present

## 2019-05-20 DIAGNOSIS — I1310 Hypertensive heart and chronic kidney disease without heart failure, with stage 1 through stage 4 chronic kidney disease, or unspecified chronic kidney disease: Secondary | ICD-10-CM | POA: Diagnosis not present

## 2019-05-23 DIAGNOSIS — I1310 Hypertensive heart and chronic kidney disease without heart failure, with stage 1 through stage 4 chronic kidney disease, or unspecified chronic kidney disease: Secondary | ICD-10-CM | POA: Diagnosis not present

## 2019-05-23 DIAGNOSIS — I890 Lymphedema, not elsewhere classified: Secondary | ICD-10-CM | POA: Diagnosis not present

## 2019-05-23 DIAGNOSIS — L97829 Non-pressure chronic ulcer of other part of left lower leg with unspecified severity: Secondary | ICD-10-CM | POA: Diagnosis not present

## 2019-05-23 DIAGNOSIS — N1832 Chronic kidney disease, stage 3b: Secondary | ICD-10-CM | POA: Diagnosis not present

## 2019-05-23 DIAGNOSIS — L97819 Non-pressure chronic ulcer of other part of right lower leg with unspecified severity: Secondary | ICD-10-CM | POA: Diagnosis not present

## 2019-05-23 DIAGNOSIS — E1122 Type 2 diabetes mellitus with diabetic chronic kidney disease: Secondary | ICD-10-CM | POA: Diagnosis not present

## 2019-05-23 DIAGNOSIS — I2510 Atherosclerotic heart disease of native coronary artery without angina pectoris: Secondary | ICD-10-CM | POA: Diagnosis not present

## 2019-05-23 DIAGNOSIS — J449 Chronic obstructive pulmonary disease, unspecified: Secondary | ICD-10-CM | POA: Diagnosis not present

## 2019-05-23 DIAGNOSIS — E1151 Type 2 diabetes mellitus with diabetic peripheral angiopathy without gangrene: Secondary | ICD-10-CM | POA: Diagnosis not present

## 2019-05-27 DIAGNOSIS — E1151 Type 2 diabetes mellitus with diabetic peripheral angiopathy without gangrene: Secondary | ICD-10-CM | POA: Diagnosis not present

## 2019-05-27 DIAGNOSIS — N1832 Chronic kidney disease, stage 3b: Secondary | ICD-10-CM | POA: Diagnosis not present

## 2019-05-27 DIAGNOSIS — L97819 Non-pressure chronic ulcer of other part of right lower leg with unspecified severity: Secondary | ICD-10-CM | POA: Diagnosis not present

## 2019-05-27 DIAGNOSIS — L97829 Non-pressure chronic ulcer of other part of left lower leg with unspecified severity: Secondary | ICD-10-CM | POA: Diagnosis not present

## 2019-05-27 DIAGNOSIS — E1122 Type 2 diabetes mellitus with diabetic chronic kidney disease: Secondary | ICD-10-CM | POA: Diagnosis not present

## 2019-05-27 DIAGNOSIS — I890 Lymphedema, not elsewhere classified: Secondary | ICD-10-CM | POA: Diagnosis not present

## 2019-05-27 DIAGNOSIS — J449 Chronic obstructive pulmonary disease, unspecified: Secondary | ICD-10-CM | POA: Diagnosis not present

## 2019-05-27 DIAGNOSIS — I1310 Hypertensive heart and chronic kidney disease without heart failure, with stage 1 through stage 4 chronic kidney disease, or unspecified chronic kidney disease: Secondary | ICD-10-CM | POA: Diagnosis not present

## 2019-05-27 DIAGNOSIS — I2510 Atherosclerotic heart disease of native coronary artery without angina pectoris: Secondary | ICD-10-CM | POA: Diagnosis not present

## 2019-05-30 DIAGNOSIS — L97819 Non-pressure chronic ulcer of other part of right lower leg with unspecified severity: Secondary | ICD-10-CM | POA: Diagnosis not present

## 2019-05-30 DIAGNOSIS — J449 Chronic obstructive pulmonary disease, unspecified: Secondary | ICD-10-CM | POA: Diagnosis not present

## 2019-05-30 DIAGNOSIS — I1310 Hypertensive heart and chronic kidney disease without heart failure, with stage 1 through stage 4 chronic kidney disease, or unspecified chronic kidney disease: Secondary | ICD-10-CM | POA: Diagnosis not present

## 2019-05-30 DIAGNOSIS — L97829 Non-pressure chronic ulcer of other part of left lower leg with unspecified severity: Secondary | ICD-10-CM | POA: Diagnosis not present

## 2019-05-30 DIAGNOSIS — E1122 Type 2 diabetes mellitus with diabetic chronic kidney disease: Secondary | ICD-10-CM | POA: Diagnosis not present

## 2019-05-30 DIAGNOSIS — E1151 Type 2 diabetes mellitus with diabetic peripheral angiopathy without gangrene: Secondary | ICD-10-CM | POA: Diagnosis not present

## 2019-05-30 DIAGNOSIS — I2510 Atherosclerotic heart disease of native coronary artery without angina pectoris: Secondary | ICD-10-CM | POA: Diagnosis not present

## 2019-05-30 DIAGNOSIS — I890 Lymphedema, not elsewhere classified: Secondary | ICD-10-CM | POA: Diagnosis not present

## 2019-05-30 DIAGNOSIS — N1832 Chronic kidney disease, stage 3b: Secondary | ICD-10-CM | POA: Diagnosis not present

## 2019-06-03 DIAGNOSIS — E1122 Type 2 diabetes mellitus with diabetic chronic kidney disease: Secondary | ICD-10-CM | POA: Diagnosis not present

## 2019-06-03 DIAGNOSIS — N1832 Chronic kidney disease, stage 3b: Secondary | ICD-10-CM | POA: Diagnosis not present

## 2019-06-03 DIAGNOSIS — I872 Venous insufficiency (chronic) (peripheral): Secondary | ICD-10-CM | POA: Diagnosis not present

## 2019-06-03 DIAGNOSIS — L97819 Non-pressure chronic ulcer of other part of right lower leg with unspecified severity: Secondary | ICD-10-CM | POA: Diagnosis not present

## 2019-06-03 DIAGNOSIS — J449 Chronic obstructive pulmonary disease, unspecified: Secondary | ICD-10-CM | POA: Diagnosis not present

## 2019-06-03 DIAGNOSIS — E1151 Type 2 diabetes mellitus with diabetic peripheral angiopathy without gangrene: Secondary | ICD-10-CM | POA: Diagnosis not present

## 2019-06-03 DIAGNOSIS — I2510 Atherosclerotic heart disease of native coronary artery without angina pectoris: Secondary | ICD-10-CM | POA: Diagnosis not present

## 2019-06-03 DIAGNOSIS — L97829 Non-pressure chronic ulcer of other part of left lower leg with unspecified severity: Secondary | ICD-10-CM | POA: Diagnosis not present

## 2019-06-03 DIAGNOSIS — E1142 Type 2 diabetes mellitus with diabetic polyneuropathy: Secondary | ICD-10-CM | POA: Diagnosis not present

## 2019-06-03 DIAGNOSIS — I890 Lymphedema, not elsewhere classified: Secondary | ICD-10-CM | POA: Diagnosis not present

## 2019-06-03 DIAGNOSIS — I1310 Hypertensive heart and chronic kidney disease without heart failure, with stage 1 through stage 4 chronic kidney disease, or unspecified chronic kidney disease: Secondary | ICD-10-CM | POA: Diagnosis not present

## 2019-06-07 DIAGNOSIS — I2510 Atherosclerotic heart disease of native coronary artery without angina pectoris: Secondary | ICD-10-CM | POA: Diagnosis not present

## 2019-06-07 DIAGNOSIS — Z6841 Body Mass Index (BMI) 40.0 and over, adult: Secondary | ICD-10-CM | POA: Diagnosis not present

## 2019-06-07 DIAGNOSIS — E782 Mixed hyperlipidemia: Secondary | ICD-10-CM | POA: Diagnosis not present

## 2019-06-07 DIAGNOSIS — Z7901 Long term (current) use of anticoagulants: Secondary | ICD-10-CM | POA: Diagnosis not present

## 2019-06-07 DIAGNOSIS — I87311 Chronic venous hypertension (idiopathic) with ulcer of right lower extremity: Secondary | ICD-10-CM | POA: Diagnosis not present

## 2019-06-07 DIAGNOSIS — E11621 Type 2 diabetes mellitus with foot ulcer: Secondary | ICD-10-CM | POA: Diagnosis not present

## 2019-06-07 DIAGNOSIS — I10 Essential (primary) hypertension: Secondary | ICD-10-CM | POA: Diagnosis not present

## 2019-06-07 DIAGNOSIS — E1140 Type 2 diabetes mellitus with diabetic neuropathy, unspecified: Secondary | ICD-10-CM | POA: Diagnosis not present

## 2019-06-07 DIAGNOSIS — G40A09 Absence epileptic syndrome, not intractable, without status epilepticus: Secondary | ICD-10-CM | POA: Diagnosis not present

## 2019-06-07 DIAGNOSIS — E1151 Type 2 diabetes mellitus with diabetic peripheral angiopathy without gangrene: Secondary | ICD-10-CM | POA: Diagnosis not present

## 2019-06-07 DIAGNOSIS — Z23 Encounter for immunization: Secondary | ICD-10-CM | POA: Diagnosis not present

## 2019-06-07 DIAGNOSIS — E1122 Type 2 diabetes mellitus with diabetic chronic kidney disease: Secondary | ICD-10-CM | POA: Diagnosis not present

## 2019-06-07 DIAGNOSIS — N1831 Chronic kidney disease, stage 3a: Secondary | ICD-10-CM | POA: Diagnosis not present

## 2019-06-07 LAB — HEMOGLOBIN A1C: Hemoglobin A1C: 8.4

## 2019-06-11 DIAGNOSIS — N1832 Chronic kidney disease, stage 3b: Secondary | ICD-10-CM | POA: Diagnosis not present

## 2019-06-11 DIAGNOSIS — I890 Lymphedema, not elsewhere classified: Secondary | ICD-10-CM | POA: Diagnosis not present

## 2019-06-11 DIAGNOSIS — E1142 Type 2 diabetes mellitus with diabetic polyneuropathy: Secondary | ICD-10-CM | POA: Diagnosis not present

## 2019-06-11 DIAGNOSIS — I872 Venous insufficiency (chronic) (peripheral): Secondary | ICD-10-CM | POA: Diagnosis not present

## 2019-06-11 DIAGNOSIS — E1151 Type 2 diabetes mellitus with diabetic peripheral angiopathy without gangrene: Secondary | ICD-10-CM | POA: Diagnosis not present

## 2019-06-11 DIAGNOSIS — E1122 Type 2 diabetes mellitus with diabetic chronic kidney disease: Secondary | ICD-10-CM | POA: Diagnosis not present

## 2019-06-11 DIAGNOSIS — L97819 Non-pressure chronic ulcer of other part of right lower leg with unspecified severity: Secondary | ICD-10-CM | POA: Diagnosis not present

## 2019-06-11 DIAGNOSIS — I2510 Atherosclerotic heart disease of native coronary artery without angina pectoris: Secondary | ICD-10-CM | POA: Diagnosis not present

## 2019-06-11 DIAGNOSIS — L97829 Non-pressure chronic ulcer of other part of left lower leg with unspecified severity: Secondary | ICD-10-CM | POA: Diagnosis not present

## 2019-06-11 DIAGNOSIS — J449 Chronic obstructive pulmonary disease, unspecified: Secondary | ICD-10-CM | POA: Diagnosis not present

## 2019-06-11 DIAGNOSIS — I1310 Hypertensive heart and chronic kidney disease without heart failure, with stage 1 through stage 4 chronic kidney disease, or unspecified chronic kidney disease: Secondary | ICD-10-CM | POA: Diagnosis not present

## 2019-06-13 DIAGNOSIS — J449 Chronic obstructive pulmonary disease, unspecified: Secondary | ICD-10-CM | POA: Diagnosis not present

## 2019-06-13 DIAGNOSIS — J9601 Acute respiratory failure with hypoxia: Secondary | ICD-10-CM | POA: Diagnosis not present

## 2019-06-14 DIAGNOSIS — I890 Lymphedema, not elsewhere classified: Secondary | ICD-10-CM | POA: Diagnosis not present

## 2019-06-14 DIAGNOSIS — I1310 Hypertensive heart and chronic kidney disease without heart failure, with stage 1 through stage 4 chronic kidney disease, or unspecified chronic kidney disease: Secondary | ICD-10-CM | POA: Diagnosis not present

## 2019-06-18 DIAGNOSIS — E1151 Type 2 diabetes mellitus with diabetic peripheral angiopathy without gangrene: Secondary | ICD-10-CM | POA: Diagnosis not present

## 2019-06-18 DIAGNOSIS — L97829 Non-pressure chronic ulcer of other part of left lower leg with unspecified severity: Secondary | ICD-10-CM | POA: Diagnosis not present

## 2019-06-18 DIAGNOSIS — I1310 Hypertensive heart and chronic kidney disease without heart failure, with stage 1 through stage 4 chronic kidney disease, or unspecified chronic kidney disease: Secondary | ICD-10-CM | POA: Diagnosis not present

## 2019-06-18 DIAGNOSIS — I872 Venous insufficiency (chronic) (peripheral): Secondary | ICD-10-CM | POA: Diagnosis not present

## 2019-06-18 DIAGNOSIS — J449 Chronic obstructive pulmonary disease, unspecified: Secondary | ICD-10-CM | POA: Diagnosis not present

## 2019-06-18 DIAGNOSIS — E1142 Type 2 diabetes mellitus with diabetic polyneuropathy: Secondary | ICD-10-CM | POA: Diagnosis not present

## 2019-06-18 DIAGNOSIS — I890 Lymphedema, not elsewhere classified: Secondary | ICD-10-CM | POA: Diagnosis not present

## 2019-06-18 DIAGNOSIS — E1122 Type 2 diabetes mellitus with diabetic chronic kidney disease: Secondary | ICD-10-CM | POA: Diagnosis not present

## 2019-06-18 DIAGNOSIS — I2510 Atherosclerotic heart disease of native coronary artery without angina pectoris: Secondary | ICD-10-CM | POA: Diagnosis not present

## 2019-06-18 DIAGNOSIS — N1832 Chronic kidney disease, stage 3b: Secondary | ICD-10-CM | POA: Diagnosis not present

## 2019-06-18 DIAGNOSIS — L97819 Non-pressure chronic ulcer of other part of right lower leg with unspecified severity: Secondary | ICD-10-CM | POA: Diagnosis not present

## 2019-06-21 DIAGNOSIS — I872 Venous insufficiency (chronic) (peripheral): Secondary | ICD-10-CM | POA: Diagnosis not present

## 2019-06-21 DIAGNOSIS — L97829 Non-pressure chronic ulcer of other part of left lower leg with unspecified severity: Secondary | ICD-10-CM | POA: Diagnosis not present

## 2019-06-21 DIAGNOSIS — N1832 Chronic kidney disease, stage 3b: Secondary | ICD-10-CM | POA: Diagnosis not present

## 2019-06-21 DIAGNOSIS — I890 Lymphedema, not elsewhere classified: Secondary | ICD-10-CM | POA: Diagnosis not present

## 2019-06-21 DIAGNOSIS — I2510 Atherosclerotic heart disease of native coronary artery without angina pectoris: Secondary | ICD-10-CM | POA: Diagnosis not present

## 2019-06-21 DIAGNOSIS — E1142 Type 2 diabetes mellitus with diabetic polyneuropathy: Secondary | ICD-10-CM | POA: Diagnosis not present

## 2019-06-21 DIAGNOSIS — J449 Chronic obstructive pulmonary disease, unspecified: Secondary | ICD-10-CM | POA: Diagnosis not present

## 2019-06-21 DIAGNOSIS — E1122 Type 2 diabetes mellitus with diabetic chronic kidney disease: Secondary | ICD-10-CM | POA: Diagnosis not present

## 2019-06-21 DIAGNOSIS — L97819 Non-pressure chronic ulcer of other part of right lower leg with unspecified severity: Secondary | ICD-10-CM | POA: Diagnosis not present

## 2019-06-21 DIAGNOSIS — E1151 Type 2 diabetes mellitus with diabetic peripheral angiopathy without gangrene: Secondary | ICD-10-CM | POA: Diagnosis not present

## 2019-06-21 DIAGNOSIS — I1310 Hypertensive heart and chronic kidney disease without heart failure, with stage 1 through stage 4 chronic kidney disease, or unspecified chronic kidney disease: Secondary | ICD-10-CM | POA: Diagnosis not present

## 2019-06-28 DIAGNOSIS — I890 Lymphedema, not elsewhere classified: Secondary | ICD-10-CM | POA: Diagnosis not present

## 2019-06-28 DIAGNOSIS — I1310 Hypertensive heart and chronic kidney disease without heart failure, with stage 1 through stage 4 chronic kidney disease, or unspecified chronic kidney disease: Secondary | ICD-10-CM | POA: Diagnosis not present

## 2019-06-28 DIAGNOSIS — L97819 Non-pressure chronic ulcer of other part of right lower leg with unspecified severity: Secondary | ICD-10-CM | POA: Diagnosis not present

## 2019-06-28 DIAGNOSIS — L97829 Non-pressure chronic ulcer of other part of left lower leg with unspecified severity: Secondary | ICD-10-CM | POA: Diagnosis not present

## 2019-06-28 DIAGNOSIS — N1832 Chronic kidney disease, stage 3b: Secondary | ICD-10-CM | POA: Diagnosis not present

## 2019-06-28 DIAGNOSIS — I2510 Atherosclerotic heart disease of native coronary artery without angina pectoris: Secondary | ICD-10-CM | POA: Diagnosis not present

## 2019-06-28 DIAGNOSIS — I872 Venous insufficiency (chronic) (peripheral): Secondary | ICD-10-CM | POA: Diagnosis not present

## 2019-06-28 DIAGNOSIS — E1142 Type 2 diabetes mellitus with diabetic polyneuropathy: Secondary | ICD-10-CM | POA: Diagnosis not present

## 2019-06-28 DIAGNOSIS — E1151 Type 2 diabetes mellitus with diabetic peripheral angiopathy without gangrene: Secondary | ICD-10-CM | POA: Diagnosis not present

## 2019-06-28 DIAGNOSIS — J449 Chronic obstructive pulmonary disease, unspecified: Secondary | ICD-10-CM | POA: Diagnosis not present

## 2019-06-28 DIAGNOSIS — E1122 Type 2 diabetes mellitus with diabetic chronic kidney disease: Secondary | ICD-10-CM | POA: Diagnosis not present

## 2019-07-01 DIAGNOSIS — J449 Chronic obstructive pulmonary disease, unspecified: Secondary | ICD-10-CM | POA: Diagnosis not present

## 2019-07-01 DIAGNOSIS — E1151 Type 2 diabetes mellitus with diabetic peripheral angiopathy without gangrene: Secondary | ICD-10-CM | POA: Diagnosis not present

## 2019-07-01 DIAGNOSIS — I1310 Hypertensive heart and chronic kidney disease without heart failure, with stage 1 through stage 4 chronic kidney disease, or unspecified chronic kidney disease: Secondary | ICD-10-CM | POA: Diagnosis not present

## 2019-07-01 DIAGNOSIS — I2510 Atherosclerotic heart disease of native coronary artery without angina pectoris: Secondary | ICD-10-CM | POA: Diagnosis not present

## 2019-07-01 DIAGNOSIS — N1832 Chronic kidney disease, stage 3b: Secondary | ICD-10-CM | POA: Diagnosis not present

## 2019-07-01 DIAGNOSIS — I872 Venous insufficiency (chronic) (peripheral): Secondary | ICD-10-CM | POA: Diagnosis not present

## 2019-07-01 DIAGNOSIS — E1122 Type 2 diabetes mellitus with diabetic chronic kidney disease: Secondary | ICD-10-CM | POA: Diagnosis not present

## 2019-07-01 DIAGNOSIS — E1142 Type 2 diabetes mellitus with diabetic polyneuropathy: Secondary | ICD-10-CM | POA: Diagnosis not present

## 2019-07-01 DIAGNOSIS — L97829 Non-pressure chronic ulcer of other part of left lower leg with unspecified severity: Secondary | ICD-10-CM | POA: Diagnosis not present

## 2019-07-01 DIAGNOSIS — I890 Lymphedema, not elsewhere classified: Secondary | ICD-10-CM | POA: Diagnosis not present

## 2019-07-01 DIAGNOSIS — L97819 Non-pressure chronic ulcer of other part of right lower leg with unspecified severity: Secondary | ICD-10-CM | POA: Diagnosis not present

## 2019-07-03 DIAGNOSIS — I1310 Hypertensive heart and chronic kidney disease without heart failure, with stage 1 through stage 4 chronic kidney disease, or unspecified chronic kidney disease: Secondary | ICD-10-CM | POA: Diagnosis not present

## 2019-07-03 DIAGNOSIS — N1832 Chronic kidney disease, stage 3b: Secondary | ICD-10-CM | POA: Diagnosis not present

## 2019-07-03 DIAGNOSIS — I2510 Atherosclerotic heart disease of native coronary artery without angina pectoris: Secondary | ICD-10-CM | POA: Diagnosis not present

## 2019-07-03 DIAGNOSIS — I890 Lymphedema, not elsewhere classified: Secondary | ICD-10-CM | POA: Diagnosis not present

## 2019-07-03 DIAGNOSIS — E1122 Type 2 diabetes mellitus with diabetic chronic kidney disease: Secondary | ICD-10-CM | POA: Diagnosis not present

## 2019-07-03 DIAGNOSIS — E1151 Type 2 diabetes mellitus with diabetic peripheral angiopathy without gangrene: Secondary | ICD-10-CM | POA: Diagnosis not present

## 2019-07-03 DIAGNOSIS — L97829 Non-pressure chronic ulcer of other part of left lower leg with unspecified severity: Secondary | ICD-10-CM | POA: Diagnosis not present

## 2019-07-03 DIAGNOSIS — L97819 Non-pressure chronic ulcer of other part of right lower leg with unspecified severity: Secondary | ICD-10-CM | POA: Diagnosis not present

## 2019-07-03 DIAGNOSIS — J449 Chronic obstructive pulmonary disease, unspecified: Secondary | ICD-10-CM | POA: Diagnosis not present

## 2019-07-05 DIAGNOSIS — E1151 Type 2 diabetes mellitus with diabetic peripheral angiopathy without gangrene: Secondary | ICD-10-CM | POA: Diagnosis not present

## 2019-07-05 DIAGNOSIS — J449 Chronic obstructive pulmonary disease, unspecified: Secondary | ICD-10-CM | POA: Diagnosis not present

## 2019-07-05 DIAGNOSIS — I890 Lymphedema, not elsewhere classified: Secondary | ICD-10-CM | POA: Diagnosis not present

## 2019-07-05 DIAGNOSIS — I1310 Hypertensive heart and chronic kidney disease without heart failure, with stage 1 through stage 4 chronic kidney disease, or unspecified chronic kidney disease: Secondary | ICD-10-CM | POA: Diagnosis not present

## 2019-07-05 DIAGNOSIS — L97819 Non-pressure chronic ulcer of other part of right lower leg with unspecified severity: Secondary | ICD-10-CM | POA: Diagnosis not present

## 2019-07-05 DIAGNOSIS — N1832 Chronic kidney disease, stage 3b: Secondary | ICD-10-CM | POA: Diagnosis not present

## 2019-07-05 DIAGNOSIS — E1122 Type 2 diabetes mellitus with diabetic chronic kidney disease: Secondary | ICD-10-CM | POA: Diagnosis not present

## 2019-07-05 DIAGNOSIS — L97829 Non-pressure chronic ulcer of other part of left lower leg with unspecified severity: Secondary | ICD-10-CM | POA: Diagnosis not present

## 2019-07-05 DIAGNOSIS — I2510 Atherosclerotic heart disease of native coronary artery without angina pectoris: Secondary | ICD-10-CM | POA: Diagnosis not present

## 2019-07-08 ENCOUNTER — Other Ambulatory Visit: Payer: Self-pay

## 2019-07-08 ENCOUNTER — Ambulatory Visit: Payer: Medicare HMO

## 2019-07-08 DIAGNOSIS — Z7901 Long term (current) use of anticoagulants: Secondary | ICD-10-CM

## 2019-07-12 DIAGNOSIS — L97829 Non-pressure chronic ulcer of other part of left lower leg with unspecified severity: Secondary | ICD-10-CM | POA: Diagnosis not present

## 2019-07-12 DIAGNOSIS — I1310 Hypertensive heart and chronic kidney disease without heart failure, with stage 1 through stage 4 chronic kidney disease, or unspecified chronic kidney disease: Secondary | ICD-10-CM | POA: Diagnosis not present

## 2019-07-12 DIAGNOSIS — E1122 Type 2 diabetes mellitus with diabetic chronic kidney disease: Secondary | ICD-10-CM | POA: Diagnosis not present

## 2019-07-12 DIAGNOSIS — E1151 Type 2 diabetes mellitus with diabetic peripheral angiopathy without gangrene: Secondary | ICD-10-CM | POA: Diagnosis not present

## 2019-07-12 DIAGNOSIS — J449 Chronic obstructive pulmonary disease, unspecified: Secondary | ICD-10-CM | POA: Diagnosis not present

## 2019-07-12 DIAGNOSIS — I2510 Atherosclerotic heart disease of native coronary artery without angina pectoris: Secondary | ICD-10-CM | POA: Diagnosis not present

## 2019-07-12 DIAGNOSIS — I890 Lymphedema, not elsewhere classified: Secondary | ICD-10-CM | POA: Diagnosis not present

## 2019-07-12 DIAGNOSIS — N1832 Chronic kidney disease, stage 3b: Secondary | ICD-10-CM | POA: Diagnosis not present

## 2019-07-12 DIAGNOSIS — L97819 Non-pressure chronic ulcer of other part of right lower leg with unspecified severity: Secondary | ICD-10-CM | POA: Diagnosis not present

## 2019-07-14 DIAGNOSIS — J449 Chronic obstructive pulmonary disease, unspecified: Secondary | ICD-10-CM | POA: Diagnosis not present

## 2019-07-14 DIAGNOSIS — J9601 Acute respiratory failure with hypoxia: Secondary | ICD-10-CM | POA: Diagnosis not present

## 2019-07-15 DIAGNOSIS — I2510 Atherosclerotic heart disease of native coronary artery without angina pectoris: Secondary | ICD-10-CM | POA: Diagnosis not present

## 2019-07-15 DIAGNOSIS — N1832 Chronic kidney disease, stage 3b: Secondary | ICD-10-CM | POA: Diagnosis not present

## 2019-07-15 DIAGNOSIS — L97819 Non-pressure chronic ulcer of other part of right lower leg with unspecified severity: Secondary | ICD-10-CM | POA: Diagnosis not present

## 2019-07-15 DIAGNOSIS — L97829 Non-pressure chronic ulcer of other part of left lower leg with unspecified severity: Secondary | ICD-10-CM | POA: Diagnosis not present

## 2019-07-15 DIAGNOSIS — I1310 Hypertensive heart and chronic kidney disease without heart failure, with stage 1 through stage 4 chronic kidney disease, or unspecified chronic kidney disease: Secondary | ICD-10-CM | POA: Diagnosis not present

## 2019-07-15 DIAGNOSIS — E1151 Type 2 diabetes mellitus with diabetic peripheral angiopathy without gangrene: Secondary | ICD-10-CM | POA: Diagnosis not present

## 2019-07-15 DIAGNOSIS — J449 Chronic obstructive pulmonary disease, unspecified: Secondary | ICD-10-CM | POA: Diagnosis not present

## 2019-07-15 DIAGNOSIS — E1122 Type 2 diabetes mellitus with diabetic chronic kidney disease: Secondary | ICD-10-CM | POA: Diagnosis not present

## 2019-07-15 DIAGNOSIS — I890 Lymphedema, not elsewhere classified: Secondary | ICD-10-CM | POA: Diagnosis not present

## 2019-07-17 DIAGNOSIS — J449 Chronic obstructive pulmonary disease, unspecified: Secondary | ICD-10-CM | POA: Diagnosis not present

## 2019-07-17 DIAGNOSIS — N1832 Chronic kidney disease, stage 3b: Secondary | ICD-10-CM | POA: Diagnosis not present

## 2019-07-17 DIAGNOSIS — L97819 Non-pressure chronic ulcer of other part of right lower leg with unspecified severity: Secondary | ICD-10-CM | POA: Diagnosis not present

## 2019-07-17 DIAGNOSIS — I2510 Atherosclerotic heart disease of native coronary artery without angina pectoris: Secondary | ICD-10-CM | POA: Diagnosis not present

## 2019-07-17 DIAGNOSIS — E1122 Type 2 diabetes mellitus with diabetic chronic kidney disease: Secondary | ICD-10-CM | POA: Diagnosis not present

## 2019-07-17 DIAGNOSIS — I890 Lymphedema, not elsewhere classified: Secondary | ICD-10-CM | POA: Diagnosis not present

## 2019-07-17 DIAGNOSIS — E1151 Type 2 diabetes mellitus with diabetic peripheral angiopathy without gangrene: Secondary | ICD-10-CM | POA: Diagnosis not present

## 2019-07-17 DIAGNOSIS — L97829 Non-pressure chronic ulcer of other part of left lower leg with unspecified severity: Secondary | ICD-10-CM | POA: Diagnosis not present

## 2019-07-17 DIAGNOSIS — I1310 Hypertensive heart and chronic kidney disease without heart failure, with stage 1 through stage 4 chronic kidney disease, or unspecified chronic kidney disease: Secondary | ICD-10-CM | POA: Diagnosis not present

## 2019-07-18 DIAGNOSIS — L97829 Non-pressure chronic ulcer of other part of left lower leg with unspecified severity: Secondary | ICD-10-CM | POA: Diagnosis not present

## 2019-07-18 DIAGNOSIS — N1832 Chronic kidney disease, stage 3b: Secondary | ICD-10-CM | POA: Diagnosis not present

## 2019-07-18 DIAGNOSIS — L97819 Non-pressure chronic ulcer of other part of right lower leg with unspecified severity: Secondary | ICD-10-CM | POA: Diagnosis not present

## 2019-07-18 DIAGNOSIS — E1151 Type 2 diabetes mellitus with diabetic peripheral angiopathy without gangrene: Secondary | ICD-10-CM | POA: Diagnosis not present

## 2019-07-18 DIAGNOSIS — I1310 Hypertensive heart and chronic kidney disease without heart failure, with stage 1 through stage 4 chronic kidney disease, or unspecified chronic kidney disease: Secondary | ICD-10-CM | POA: Diagnosis not present

## 2019-07-18 DIAGNOSIS — E1122 Type 2 diabetes mellitus with diabetic chronic kidney disease: Secondary | ICD-10-CM | POA: Diagnosis not present

## 2019-07-18 DIAGNOSIS — I2510 Atherosclerotic heart disease of native coronary artery without angina pectoris: Secondary | ICD-10-CM | POA: Diagnosis not present

## 2019-07-18 DIAGNOSIS — I890 Lymphedema, not elsewhere classified: Secondary | ICD-10-CM | POA: Diagnosis not present

## 2019-07-18 DIAGNOSIS — J449 Chronic obstructive pulmonary disease, unspecified: Secondary | ICD-10-CM | POA: Diagnosis not present

## 2019-07-19 ENCOUNTER — Other Ambulatory Visit: Payer: Self-pay

## 2019-07-19 NOTE — Patient Instructions (Addendum)
Fall Prevention in the Home, Adult Falls can cause injuries. They can happen to people of all ages. There are many things you can do to make your home safe and to help prevent falls. Ask for help when making these changes, if needed. What actions can I take to prevent falls? General Instructions  Use good lighting in all rooms. Replace any light bulbs that burn out.  Turn on the lights when you go into a dark area. Use night-lights.  Keep items that you use often in easy-to-reach places. Lower the shelves around your home if necessary.  Set up your furniture so you have a clear path. Avoid moving your furniture around.  Do not have throw rugs and other things on the floor that can make you trip.  Avoid walking on wet floors.  If any of your floors are uneven, fix them.  Add color or contrast paint or tape to clearly mark and help you see: ? Any grab bars or handrails. ? First and last steps of stairways. ? Where the edge of each step is.  If you use a stepladder: ? Make sure that it is fully opened. Do not climb a closed stepladder. ? Make sure that both sides of the stepladder are locked into place. ? Ask someone to hold the stepladder for you while you use it.  If there are any pets around you, be aware of where they are. What can I do in the bathroom?      Keep the floor dry. Clean up any water that spills onto the floor as soon as it happens.  Remove soap buildup in the tub or shower regularly.  Use non-skid mats or decals on the floor of the tub or shower.  Attach bath mats securely with double-sided, non-slip rug tape.  If you need to sit down in the shower, use a plastic, non-slip stool.  Install grab bars by the toilet and in the tub and shower. Do not use towel bars as grab bars. What can I do in the bedroom?  Make sure that you have a light by your bed that is easy to reach.  Do not use any sheets or blankets that are too big for your bed. They should not  hang down onto the floor.  Have a firm chair that has side arms. You can use this for support while you get dressed. What can I do in the kitchen?  Clean up any spills right away.  If you need to reach something above you, use a strong step stool that has a grab bar.  Keep electrical cords out of the way.  Do not use floor polish or wax that makes floors slippery. If you must use wax, use non-skid floor wax. What can I do with my stairs?  Do not leave any items on the stairs.  Make sure that you have a light switch at the top of the stairs and the bottom of the stairs. If you do not have them, ask someone to add them for you.  Make sure that there are handrails on both sides of the stairs, and use them. Fix handrails that are broken or loose. Make sure that handrails are as long as the stairways.  Install non-slip stair treads on all stairs in your home.  Avoid having throw rugs at the top or bottom of the stairs. If you do have throw rugs, attach them to the floor with carpet tape.  Choose a carpet that does  not hide the edge of the steps on the stairway.  Check any carpeting to make sure that it is firmly attached to the stairs. Fix any carpet that is loose or worn. What can I do on the outside of my home?  Use bright outdoor lighting.  Regularly fix the edges of walkways and driveways and fix any cracks.  Remove anything that might make you trip as you walk through a door, such as a raised step or threshold.  Trim any bushes or trees on the path to your home.  Regularly check to see if handrails are loose or broken. Make sure that both sides of any steps have handrails.  Install guardrails along the edges of any raised decks and porches.  Clear walking paths of anything that might make someone trip, such as tools or rocks.  Have any leaves, snow, or ice cleared regularly.  Use sand or salt on walking paths during winter.  Clean up any spills in your garage right  away. This includes grease or oil spills. What other actions can I take?  Wear shoes that: ? Have a low heel. Do not wear high heels. ? Have rubber bottoms. ? Are comfortable and fit you well. ? Are closed at the toe. Do not wear open-toe sandals.  Use tools that help you move around (mobility aids) if they are needed. These include: ? Canes. ? Walkers. ? Scooters. ? Crutches.  Review your medicines with your doctor. Some medicines can make you feel dizzy. This can increase your chance of falling. Ask your doctor what other things you can do to help prevent falls. Where to find more information  Centers for Disease Control and Prevention, STEADI: https://garcia.biz/  [employer] on Aging: BrainJudge.co.uk Contact a doctor if:  You are afraid of falling at home.  You feel weak, drowsy, or dizzy at home.  You fall at home. Summary  There are many simple things that you can do to make your home safe and to help prevent falls.  Ways to make your home safe include removing tripping hazards and installing grab bars in the bathroom.  Ask for help when making these changes in your home. This information is not intended to replace advice given to you by your health care provider. Make sure you discuss any questions you have with your health care provider. Document Revised: 09/06/2018 Document Reviewed: 12/29/2016 Elsevier Patient Education  [place].  Fecal Incontinence Fecal incontinence, also called accidental bowel leakage, is not being able to control your bowels. This condition happens because the nerves or muscles around the anus do not work the way they should. This affects their ability to hold stool (feces). What are the causes? This condition may be caused by:  Damage to the muscles at the end of the rectum (sphincter).  Damage to the nerves that control bowel movements.  Diarrhea.  Chronic constipation.  Pelvic floor dysfunction. This  means the muscles in the pelvis do not work well.  Loss of bowel storage capacity. This occurs when the rectum can no longer stretch in size in order to store feces.  Inflammatory bowel disease (IBD), such as Crohn's disease.  Irritable bowel syndrome (IBS). What increases the risk? You are more likely to develop this condition if you:  Were born with bowels or a pelvis that did not form correctly.  Have had rectal surgery.  Have had radiation treatment for certain cancers.  Have been pregnant, had a vaginal delivery, or had surgery  that damaged the pelvic floor muscles.  Had a complicated childbirth, spinal cord injury, or other trauma that caused nerve damage.  Have a condition that can affect nerve function, such as diabetes, Parkinson's disease, or multiple sclerosis.  Have a condition where the rectum drops down into the anus or vagina (prolapse).  Are 5 years of age or older. What are the signs or symptoms? The main symptom of this condition is not being able to control your bowels. You also might not be able to get to the bathroom before a bowel movement. How is this diagnosed? This condition is diagnosed with a medical history and physical exam. You may also have other tests, including:  Blood tests.  Urine tests.  A rectal exam.  Ultrasound.  MRI.  Colonoscopy. This is an exam that looks at your large intestine (colon).  Anal manometry. This is a test that measures the strength of the anal sphincter.  Anal electromyogram (EMG). This is a test that uses small electrodes to check for nerve damage. How is this treated? Treatment for this condition depends on the cause and severity. Treatment may also focus on addressing any underlying causes of this condition. Treatment may include:  Medicines. This may include medicines to: ? Prevent diarrhea. ? Help with constipation (bulk-forming laxatives). ? Treat any underlying conditions.  Biofeedback therapy. This  can help to retrain muscles that are affected.  Fiber supplements. These can help manage your bowel movements.  Nerve stimulation.  Injectable gel to promote tissue growth and better muscle control.  Surgery. You may need: ? Sphincter repair surgery. ? Diversion surgery. This procedure lets feces pass out of your body through a hole in your abdomen. Follow these instructions at home: Eating and drinking   Follow instructions from your health care provider about any eating or drinking restrictions. ? Work with a dietitian to come up with a healthy diet that will help you avoid the foods that can make your condition worse. ? Keep a diet diary to find out which foods or drinks could be making your condition worse.  Drink enough fluid to keep your urine pale yellow. Lifestyle  Do not use any products that contain nicotine or tobacco, such as cigarettes and e-cigarettes. If you need help quitting, ask your health care provider. This may help your condition.  If you are overweight, talk with your health care provider about how to safely lose weight. This may help your condition.  Increase your physical activity as told by your health care provider. This may help your condition. Always talk with your health care provider before starting a new exercise program.  Carry a change of clothes and supplies to clean up quickly if you have an episode of fetal incontinence.  Consider joining a fecal incontinence support group. You can find a support group online or in your local community. General instructions   Take over-the-counter and prescription medicines only as told by your health care provider. This includes any supplements.  Apply a moisture barrier, such as petroleum jelly, to your rectum. This protects the skin from irritation caused by ongoing leaking or diarrhea.  Tell your health care provider if you are upset or depressed about your condition.  Keep all follow-up visits as told  by your health care provider. This is important. Where to find more information  International Foundation for Functional Gastrointestinal Disorders: iffgd.[place] of Gastro[facility]: patients.gi.org Contact a health care provider if:  You have a fever.  You have  redness, swelling, or pain around your rectum.  Your pain is getting worse or you lose feeling in your rectal area.  You have blood in your stool.  You feel sad or hopeless.  You avoid social or work situations. Get help right away if:  You stop having bowel movements.  You cannot eat or drink without vomiting.  You have rectal bleeding that does not stop.  You have severe pain that is getting worse.  You have symptoms of dehydration, including: ? Sleepiness or fatigue. ? Producing little or no urine, tears, or sweat. ? Dizziness. ? Dry mouth. ? Unusual irritability. ? Headache. ? Inability to think clearly. Summary  Fecal incontinence, also called accidental bowel leakage, is not being able to control your bowels. This condition happens because the nerves or muscles around the anus do not work the way they should.  Treatment varies depending on the cause and severity of your condition. Treatment may also focus on addressing any underlying causes of this condition.  Follow instructions from your health care provider about any eating or drinking restrictions, lifestyle changes, and skin care.  Take over-the-counter and prescription medicines only as told by your health care provider. This includes any supplements.  Tell your health care provider if your symptoms worsen or if you are upset or depressed about your condition. This information is not intended to replace advice given to you by your health care provider. Make sure you discuss any questions you have with your health care provider. Document Revised: 09/28/2017 Document Reviewed: 09/28/2017 Elsevier Patient Education  [place].  Immunization Schedule, 40-58 Years Old  Vaccines are usually given at various ages, according to a schedule. Your health care provider will recommend vaccines for you based on your age, medical history, and lifestyle or other factors such as travel or where you work. You may receive vaccines as individual doses or as more than one vaccine together in one shot (combination vaccines). Talk with your health care provider about the risks and benefits of combination vaccines. Recommended immunizations for 28-52 years old Influenza vaccine  You should get a dose of the influenza vaccine every year. Tetanus, diphtheria, and pertussis vaccine A vaccine that protects against tetanus, diphtheria, and pertussis is known as the Tdap vaccine. A vaccine that protects against tetanus and diphtheria is known as the Td vaccine.  You should only get the Td vaccine if you have had at least 1 dose of the Tdap vaccine.  You should get 1 dose of the Td or Tdap vaccine every 10 years, or you should get 1 dose of the Tdap vaccine if: ? You have not previously gotten a Tdap vaccine. ? You do not know if you have ever gotten a Tdap vaccine. Zoster vaccine This is also known as the RZV vaccine. You should get 2 doses of the RZV vaccine 2 to 6 months apart. It is important to get the RZV vaccine even if you:  Have had shingles.  Have received the ZVL vaccine, an older version of the RZV vaccine.  Are unsure if you have had chickenpox (varicella). Pneumococcal conjugate vaccine This is also known as the PCV13 vaccine. You should get the PCV13 vaccine as recommended if you have certain high-risk conditions. These include:  Diabetes.  Chronic conditions of the heart, lungs, or liver.  Conditions that affect the body's disease-fighting system (immune system). Pneumococcal polysaccharide vaccine This is also known as the PPSV23 vaccine. You should get the PPSV23 vaccine as recommended  if you have certain  high-risk conditions. These include:  Diabetes.  Chronic conditions of the heart, lungs, or liver.  Conditions that affect the immune system. Hepatitis A vaccine This is also known as the HepA vaccine. If you did not get the HepA vaccine previously, you should get it if:  You are at risk for a hepatitis A infection. You may be at risk for infection if you: ? Have chronic liver disease. ? Have HIV or AIDS. ? Are a man who has sex with men. ? Use drugs. ? Are homeless. ? May be exposed to hepatitis A through work. ? Travel to countries where hepatitis A is common. ? Have or will have close contact with someone who was adopted from another country.  You are not at risk for infection but want protection from hepatitis A. Hepatitis B vaccine This is also known as the HepB vaccine. If you did not get the HepB vaccine previously, you should get it if:  You are at risk for hepatitis B infection. You are at risk if you: ? Have chronic liver disease. ? Have HIV or AIDS. ? Have sex with a partner who has hepatitis B, or:  You have multiple sex partners.  You are a man who has sex with men. ? Use drugs. ? May be exposed to hepatitis B through work. ? Live with someone who has hepatitis B. ? Receive dialysis treatment. ? Have diabetes. ? Travel to countries where hepatitis B is common.  You are not at risk of infection but want protection from hepatitis B. Measles, mumps, and rubella vaccine This is also known as the MMR vaccine. You may need to get the MMR vaccine if you were born in 1957 or later and:  You need to catch up on doses you missed in the past.  You have not been given the vaccine before.  You do not have evidence of immunity (by a blood test). Varicella vaccine This is also known as the VAR vaccine. You may need to get the VAR vaccine if you do not have evidence of immunity (by a blood test) and you may be exposed to varicella through work. This is especially  important if you work in a health care setting. Meningococcal conjugate vaccine This is also known as the MenACWY vaccine. You may need to get the MenACWY vaccine if you:  Have not been given the vaccine before.  Need to catch up on doses you missed in the past. This vaccine is especially important if you:  Do not have a spleen.  Have sickle cell disease.  Have HIV.  Take medicines that suppress your immune system.  Travel to countries where meningococcal disease is common.  Are exposed to Neisseria meningitidis at work. Serogroup B meningococcal vaccine This is also known as the MenB vaccine. You may need to get the MenB vaccine if you:  Have not been given the vaccine before.  Need to catch up on doses you missed in the past. This vaccine is especially important if you:  Do not have a spleen.  Have sickle cell disease.  Take medicines that suppress your immune system.  Are exposed to Neisseria meningitidis at work. Haemophilus influenzae type b vaccine This is also known as the Hib vaccine. Anyone older than 66 years of age is usually not given the Hib vaccine. However, if you have certain high-risk conditions, you may need to get this vaccine. These conditions include:  Not having a spleen.  Having  received a stem cell transplant. Before you get a vaccine: Talk with your health care provider about which vaccines are right for you. This is especially important if:  You previously had a reaction after getting a vaccine.  You have a weakened immune system. You may have a weakened immune system if you: ? Are taking medicines that reduce (suppress) the activity of your immune system. ? Are taking medicines to treat cancer (chemotherapy). ? Have HIV or AIDS.  You work in an environment where you may be exposed to a disease.  You plan to travel outside of the country.  You have a chronic illness, such as heart disease, kidney disease, diabetes, or lung  disease. Summary  Before you get a vaccine, tell your health care provider if you have reacted to vaccines in the past or have a condition that weakens your immune system.  At 50-64 years, you should get a dose of the flu vaccine every year and a dose of the Td or Tdap vaccine every 10 years.  You should get 2 doses of the RZV vaccine 2 to 6 months apart.  Depending on your medical history and your risk factors, you may need other vaccines. Ask your health care provider whether you are up to date on all your vaccines. This information is not intended to replace advice given to you by your health care provider. Make sure you discuss any questions you have with your health care provider. Document Revised: 03/12/2019 Document Reviewed: 03/12/2019 Elsevier Patient Education  [place] and Cholesterol Restricted Eating Plan Eating a diet that limits fat and cholesterol may help lower your risk for heart disease and other conditions. Your body needs fat and cholesterol for basic functions, but eating too much of these things can be harmful to your health. Your health care provider may order lab tests to check your blood fat (lipid) and cholesterol levels. This helps your health care provider understand your risk for certain conditions and whether you need to make diet changes. Work with your health care provider or dietitian to make an eating plan that is right for you. Your plan includes:  Limit your fat intake to ______% or less of your total calories a day.  Limit your saturated fat intake to ______% or less of your total calories a day.  Limit the amount of cholesterol in your diet to less than _________mg a day.  Eat ___________ g of fiber a day. What are tips for following this plan? General guidelines   If you are overweight, work with your health care provider to lose weight safely. Losing just 5-10% of your body weight can improve your overall health and help prevent  diseases such as diabetes and heart disease.  Avoid: ? Foods with added sugar. ? Fried foods. ? Foods that contain partially hydrogenated oils, including stick margarine, some tub margarines, cookies, crackers, and other baked goods.  Limit alcohol intake to no more than 1 drink a day for nonpregnant women and 2 drinks a day for men. One drink equals 12 oz of beer, 5 oz of wine, or 1 oz of hard liquor. Reading food labels  Check food labels for: ? Trans fats, partially hydrogenated oils, or high amounts of saturated fat. Avoid foods that contain saturated fat and trans fat. ? The amount of cholesterol in each serving. Try to eat no more than 200 mg of cholesterol each day. ? The amount of fiber in each serving. Try to  eat at least 20-30 g of fiber each day.  Choose foods with healthy fats, such as: ? Monounsaturated and polyunsaturated fats. These include olive and canola oil, flaxseeds, walnuts, almonds, and seeds. ? Omega-3 fats. These are found in foods such as salmon, mackerel, sardines, tuna, flaxseed oil, and ground flaxseeds.  Choose grain products that have whole grains. Look for the word "whole" as the first word in the ingredient list. Cooking  Cook foods using methods other than frying. Baking, boiling, grilling, and broiling are some healthy options.  Eat more home-cooked food and less restaurant, buffet, and fast food.  Avoid cooking using saturated fats. ? Animal sources of saturated fats include meats, butter, and cream. ? Plant sources of saturated fats include palm oil, palm kernel oil, and coconut oil. Meal planning   At meals, imagine dividing your plate into fourths: ? Fill one-half of your plate with vegetables and green salads. ? Fill one-fourth of your plate with whole grains. ? Fill one-fourth of your plate with lean protein foods.  Eat fish that is high in omega-3 fats at least two times a week.  Eat more foods that contain fiber, such as whole grains,  beans, apples, broccoli, carrots, peas, and barley. These foods help promote healthy cholesterol levels in the blood. Recommended foods Grains  Whole grains, such as whole wheat or whole grain breads, crackers, cereals, and pasta. Unsweetened oatmeal, bulgur, barley, quinoa, or brown rice. Corn or whole wheat flour tortillas. Vegetables  Fresh or frozen vegetables (raw, steamed, roasted, or grilled). Green salads. Fruits  All fresh, canned (in natural juice), or frozen fruits. Meats and other protein foods  Ground beef (85% or leaner), grass-fed beef, or beef trimmed of fat. Skinless chicken or [place]. Ground chicken or [place]. Pork trimmed of fat. All fish and seafood. Egg whites. Dried beans, peas, or lentils. Unsalted nuts or seeds. Unsalted canned beans. Natural nut butters without added sugar and oil. Dairy  Low-fat or nonfat dairy products, such as skim or 1% milk, 2% or reduced-fat cheeses, low-fat and fat-free ricotta or cottage cheese, or plain low-fat and nonfat yogurt. Fats and oils  Tub margarine without trans fats. Light or reduced-fat mayonnaise and salad dressings. Avocado. Olive, canola, sesame, or safflower oils. The items listed above may not be a complete list of recommended foods or beverages. Contact your dietitian for more options. Foods to avoid Grains  White bread. White pasta. White rice. Cornbread. Bagels, pastries, and croissants. Crackers and snack foods that contain trans fat and hydrogenated oils. Vegetables  Vegetables cooked in cheese, cream, or butter sauce. Fried vegetables. Fruits  Canned fruit in heavy syrup. Fruit in cream or butter sauce. Fried fruit. Meats and other protein foods  Fatty cuts of meat. Ribs, chicken wings, bacon, sausage, bologna, salami, chitterlings, fatback, hot dogs, bratwurst, and packaged lunch meats. Liver and organ meats. Whole eggs and egg yolks. Chicken and [place] with skin. Fried meat. Dairy  Whole or 2% milk, cream,  half-and-half, and cream cheese. Whole milk cheeses. Whole-fat or sweetened yogurt. Full-fat cheeses. Nondairy creamers and whipped toppings. Processed cheese, cheese spreads, and cheese curds. Beverages  Alcohol. Sugar-sweetened drinks such as sodas, lemonade, and fruit drinks. Fats and oils  Butter, stick margarine, lard, shortening, ghee, or bacon fat. Coconut, palm kernel, and palm oils. Sweets and desserts  Corn syrup, sugars, honey, and molasses. Candy. Jam and jelly. Syrup. Sweetened cereals. Cookies, pies, cakes, donuts, muffins, and ice cream. The items listed above may not be a  complete list of foods and beverages to avoid. Contact your dietitian for more information. Summary  Your body needs fat and cholesterol for basic functions. However, eating too much of these things can be harmful to your health.  Work with your health care provider and dietitian to follow a diet low in fat and cholesterol. Doing this may help lower your risk for heart disease and other conditions.  Choose healthy fats, such as monounsaturated and polyunsaturated fats, and foods high in omega-3 fatty acids.  Eat fiber-rich foods, such as whole grains, beans, peas, fruits, and vegetables.  Limit or avoid alcohol, fried foods, and foods high in saturated fats, partially hydrogenated oils, and sugar. This information is not intended to replace advice given to you by your health care provider. Make sure you discuss any questions you have with your health care provider. Document Revised: 04/28/2017 Document Reviewed: 01/31/2017 Elsevier Patient Education  2020 [place] for Type 2 Diabetes  A screening test for type 2 diabetes (type 2 diabetes mellitus) is a blood test to measure your blood sugar (glucose) level. This test is done to check for early signs of diabetes, before you develop symptoms.  Type 2 diabetes is a long-term (chronic) disease. In type 2 diabetes, one or both of these  problems may be present:  The pancreas does not make enough of a hormone called insulin.  Cells in the body do not respond properly to insulin that the body makes (insulin resistance). Normally, insulin allows blood sugar (glucose) to enter cells in the body. The cells use glucose for energy. Insulin resistance or lack of insulin causes excess glucose to build up in the blood instead of going into cells. This results in high blood glucose levels (hyperglycemia), which can cause many complications. You may be screened for type 2 diabetes as part of your regular health care, especially if you have a high risk for diabetes. Screening can help to identify type 2 diabetes at its early stage (prediabetes). Identifying and treating prediabetes may delay or prevent the development of type 2 diabetes. What are the risk factors for type 2 diabetes? The following factors may make you more likely to develop type 2 diabetes:  Having a parent or sibling (first-degree relative) who has diabetes.  Being overweight or obese.  Being of American-Indian, African-American, Hispanic, Latino, Asian, or [place] descent.  Not getting enough exercise (having a sedentary lifestyle).  Being older than age 91.  Having a history of diabetes during pregnancy (gestational diabetes).  Having low levels of good cholesterol (HDL-C) or high levels of blood fats (triglycerides).  Having high blood glucose in a previous blood test.  Having high blood pressure.  Having certain diseases or conditions that may be caused by insulin resistance, including: ? Acanthosis nigricans. This is a condition that causes dark skin on the neck, armpits, and groin. ? Polycystic ovary syndrome (PCOS). ? Cardiovascular heart disease. Who should be screened for type 2 diabetes? Adults  Adults age 49 and older. These adults should be screened once every three years.  Adults who are younger than age 56, are overweight, and have  one other risk factor. These adults should be screened once every three years.  Adults who have normal blood glucose levels and two or more risk factors. These adults may be screened once every year (annually).  Women who have had gestational diabetes in the past. These women should be screened once every three years.  Pregnant women who have  risk factors. These women should be screened at their first prenatal visit and again between weeks 24 and 28 of pregnancy. Children and adolescents  Children and adolescents should be screened for type 2 diabetes if they are overweight and have any of the following risk factors: ? A family history of type 2 diabetes. ? Being a member of a high-risk ethnic group. ? Signs of insulin resistance or conditions that are associated with insulin resistance. ? A mother who had gestational diabetes while pregnant with him or her.  Screening should be done at least once every three years, starting at age 48 or at the onset of puberty, whichever comes first. Your health care provider or your child's health care provider may recommend having a screening more or less often. What happens during screening? During screening, your health care provider may ask questions about:  Your health and your risk factors, including your activity level and any medical conditions that you have.  The health of your first-degree relatives.  Past pregnancies, if this applies. Your health care provider will also do a physical exam, including a blood pressure measurement and blood tests. There are four blood tests that can be used to screen for type 2 diabetes. You may have one or more of the following:  A fasting blood glucose (FBG) test. You will not be allowed to eat (you will fast) for 8 hours or more before a blood sample is taken.  A random blood glucose test. This test checks your blood glucose at any time of the day regardless of when you ate.  An oral glucose tolerance test  (OGTT). This test measures your blood glucose at two times: ? After you have not eaten (have fasted) overnight. This is your baseline glucose level. ? Two hours after you drink a glucose-containing beverage.  An A1c (hemoglobin A1c) blood test. This test provides information about blood glucose control over the previous 2-3 months. What do the results mean? Your test results are a measurement of how much glucose is in your blood. Normal blood glucose levels mean that you do not have diabetes or prediabetes. High blood glucose levels may mean that you have prediabetes or diabetes. Depending on the results, other tests may be needed to confirm the diagnosis. You may be diagnosed with type 2 diabetes if:  Your FBG level is 126 mg/dL (7.0 mmol/L) or higher.  Your random blood glucose level is 200 mg/dL (11.1 mmol/L) or higher.  Your A1c level is 6.5% or higher.  Your OGTT result is higher than 200 mg/dL (11.1 mmol/L). These blood tests may be repeated to confirm your diagnosis. Talk with your health care provider about what your results mean. Summary  A screening test for type 2 diabetes (type 2 diabetes mellitus) is a blood test to measure your blood sugar (glucose) level.  Know what your risk factors are for developing type 2 diabetes.  If you are at risk, get screening tests as often as told by your health care provider.  Screening may help you identify type 2 diabetes at its early stage (prediabetes). Identifying and treating prediabetes may delay or prevent the development of type 2 diabetes. This information is not intended to replace advice given to you by your health care provider. Make sure you discuss any questions you have with your health care provider. Document Revised: 09/07/2018 Document Reviewed: 06/18/2016 Elsevier Patient Education  [place].   Diabetes Mellitus and [place] care is an important part of  your health, especially when you have diabetes.  Diabetes may cause you to have problems because of poor blood flow (circulation) to your feet and legs, which can cause your skin to:  Become thinner and drier.  Break more easily.  Heal more slowly.  Peel and crack. You may also have nerve damage (neuropathy) in your legs and feet, causing decreased feeling in them. This means that you may not notice minor injuries to your feet that could lead to more serious problems. Noticing and addressing any potential problems early is the best way to prevent future foot problems. How to care for your feet Foot hygiene  Wash your feet daily with warm water and mild soap. Do not use hot water. Then, pat your feet and the areas between your toes until they are completely dry. Do not soak your feet as this can dry your skin.  Trim your toenails straight across. Do not dig under them or around the cuticle. File the edges of your nails with an emery board or nail file.  Apply a moisturizing lotion or petroleum jelly to the skin on your feet and to dry, brittle toenails. Use lotion that does not contain alcohol and is unscented. Do not apply lotion between your toes. Shoes and socks  Wear clean socks or stockings every day. Make sure they are not too tight. Do not wear knee-high stockings since they may decrease blood flow to your legs.  Wear shoes that fit properly and have enough cushioning. Always look in your shoes before you put them on to be sure there are no objects inside.  To break in new shoes, wear them for just a few hours a day. This prevents injuries on your feet. Wounds, scrapes, corns, and calluses  Check your feet daily for blisters, cuts, bruises, sores, and redness. If you cannot see the bottom of your feet, use a mirror or ask someone for help.  Do not cut corns or calluses or try to remove them with medicine.  If you find a minor scrape, cut, or break in the skin on your feet, keep it and the skin around it clean and dry. You may  clean these areas with mild soap and water. Do not clean the area with peroxide, alcohol, or iodine.  If you have a wound, scrape, corn, or callus on your foot, look at it several times a day to make sure it is healing and not infected. Check for: ? Redness, swelling, or pain. ? Fluid or blood. ? Warmth. ? Pus or a bad smell. General instructions  Do not cross your legs. This may decrease blood flow to your feet.  Do not use heating pads or hot water bottles on your feet. They may burn your skin. If you have lost feeling in your feet or legs, you may not know this is happening until it is too late.  Protect your feet from hot and cold by wearing shoes, such as at the beach or on hot pavement.  Schedule a complete foot exam at least once a year (annually) or more often if you have foot problems. If you have foot problems, report any cuts, sores, or bruises to your health care provider immediately. Contact a health care provider if:  You have a medical condition that increases your risk of infection and you have any cuts, sores, or bruises on your feet.  You have an injury that is not healing.  You have redness on your legs or feet.  You feel burning or tingling in your legs or feet.  You have pain or cramps in your legs and feet.  Your legs or feet are numb.  Your feet always feel cold.  You have pain around a toenail. Get help right away if:  You have a wound, scrape, corn, or callus on your foot and: ? You have pain, swelling, or redness that gets worse. ? You have fluid or blood coming from the wound, scrape, corn, or callus. ? Your wound, scrape, corn, or callus feels warm to the touch. ? You have pus or a bad smell coming from the wound, scrape, corn, or callus. ? You have a fever. ? You have a red line going up your leg. Summary  Check your feet every day for cuts, sores, red spots, swelling, and blisters.  Moisturize feet and legs daily.  Wear shoes that fit  properly and have enough cushioning.  If you have foot problems, report any cuts, sores, or bruises to your health care provider immediately.  Schedule a complete foot exam at least once a year (annually) or more often if you have foot problems. This information is not intended to replace advice given to you by your health care provider. Make sure you discuss any questions you have with your health care provider. Document Revised: 02/06/2019 Document Reviewed: 06/17/2016 Elsevier Patient Education  [place] Prevention in the Home, Adult Falls can cause injuries. They can happen to people of all ages. There are many things you can do to make your home safe and to help prevent falls. Ask for help when making these changes, if needed. What actions can I take to prevent falls? General Instructions  Use good lighting in all rooms. Replace any light bulbs that burn out.  Turn on the lights when you go into a dark area. Use night-lights.  Keep items that you use often in easy-to-reach places. Lower the shelves around your home if necessary.  Set up your furniture so you have a clear path. Avoid moving your furniture around.  Do not have throw rugs and other things on the floor that can make you trip.  Avoid walking on wet floors.  If any of your floors are uneven, fix them.  Add color or contrast paint or tape to clearly mark and help you see: ? Any grab bars or handrails. ? First and last steps of stairways. ? Where the edge of each step is.  If you use a stepladder: ? Make sure that it is fully opened. Do not climb a closed stepladder. ? Make sure that both sides of the stepladder are locked into place. ? Ask someone to hold the stepladder for you while you use it.  If there are any pets around you, be aware of where they are. What can I do in the bathroom?      Keep the floor dry. Clean up any water that spills onto the floor as soon as it happens.  Remove  soap buildup in the tub or shower regularly.  Use non-skid mats or decals on the floor of the tub or shower.  Attach bath mats securely with double-sided, non-slip rug tape.  If you need to sit down in the shower, use a plastic, non-slip stool.  Install grab bars by the toilet and in the tub and shower. Do not use towel bars as grab bars. What can I do in the bedroom?  Make sure that you have a  light by your bed that is easy to reach.  Do not use any sheets or blankets that are too big for your bed. They should not hang down onto the floor.  Have a firm chair that has side arms. You can use this for support while you get dressed. What can I do in the kitchen?  Clean up any spills right away.  If you need to reach something above you, use a strong step stool that has a grab bar.  Keep electrical cords out of the way.  Do not use floor polish or wax that makes floors slippery. If you must use wax, use non-skid floor wax. What can I do with my stairs?  Do not leave any items on the stairs.  Make sure that you have a light switch at the top of the stairs and the bottom of the stairs. If you do not have them, ask someone to add them for you.  Make sure that there are handrails on both sides of the stairs, and use them. Fix handrails that are broken or loose. Make sure that handrails are as long as the stairways.  Install non-slip stair treads on all stairs in your home.  Avoid having throw rugs at the top or bottom of the stairs. If you do have throw rugs, attach them to the floor with carpet tape.  Choose a carpet that does not hide the edge of the steps on the stairway.  Check any carpeting to make sure that it is firmly attached to the stairs. Fix any carpet that is loose or worn. What can I do on the outside of my home?  Use bright outdoor lighting.  Regularly fix the edges of walkways and driveways and fix any cracks.  Remove anything that might make you trip as you walk  through a door, such as a raised step or threshold.  Trim any bushes or trees on the path to your home.  Regularly check to see if handrails are loose or broken. Make sure that both sides of any steps have handrails.  Install guardrails along the edges of any raised decks and porches.  Clear walking paths of anything that might make someone trip, such as tools or rocks.  Have any leaves, snow, or ice cleared regularly.  Use sand or salt on walking paths during winter.  Clean up any spills in your garage right away. This includes grease or oil spills. What other actions can I take?  Wear shoes that: ? Have a low heel. Do not wear high heels. ? Have rubber bottoms. ? Are comfortable and fit you well. ? Are closed at the toe. Do not wear open-toe sandals.  Use tools that help you move around (mobility aids) if they are needed. These include: ? Canes. ? Walkers. ? Scooters. ? Crutches.  Review your medicines with your doctor. Some medicines can make you feel dizzy. This can increase your chance of falling. Ask your doctor what other things you can do to help prevent falls. Where to find more information  Centers for Disease Control and Prevention, [facility]: https://garcia.biz/  [employer] on Aging: BrainJudge.co.uk Contact a doctor if:  You are afraid of falling at home.  You feel weak, drowsy, or dizzy at home.  You fall at home. Summary  There are many simple things that you can do to make your home safe and to help prevent falls.  Ways to make your home safe include removing tripping hazards and installing grab  bars in the bathroom.  Ask for help when making these changes in your home. This information is not intended to replace advice given to you by your health care provider. Make sure you discuss any questions you have with your health care provider. Document Revised: 09/06/2018 Document Reviewed: 12/29/2016 Elsevier Patient Education  2020 [place] Maintenance, Male Adopting a healthy lifestyle and getting preventive care are important in promoting health and wellness. Ask your health care provider about:  The right schedule for you to have regular tests and exams.  Things you can do on your own to prevent diseases and keep yourself healthy. What should I know about diet, weight, and exercise? Eat a healthy diet   Eat a diet that includes plenty of vegetables, fruits, low-fat dairy products, and lean protein.  Do not eat a lot of foods that are high in solid fats, added sugars, or sodium. Maintain a healthy weight Body mass index (BMI) is a measurement that can be used to identify possible weight problems. It estimates body fat based on height and weight. Your health care provider can help determine your BMI and help you achieve or maintain a healthy weight. Get regular exercise Get regular exercise. This is one of the most important things you can do for your health. Most adults should:  Exercise for at least 150 minutes each week. The exercise should increase your heart rate and make you sweat (moderate-intensity exercise).  Do strengthening exercises at least twice a week. This is in addition to the moderate-intensity exercise.  Spend less time sitting. Even light physical activity can be beneficial. Watch cholesterol and blood lipids Have your blood tested for lipids and cholesterol at 65 years of age, then have this test every 5 years. You may need to have your cholesterol levels checked more often if:  Your lipid or cholesterol levels are high.  You are older than 65 years of age.  You are at high risk for heart disease. What should I know about cancer screening? Many types of cancers can be detected early and may often be prevented. Depending on your health history and family history, you may need to have cancer screening at various ages. This may include screening for:  Colorectal cancer.  Prostate  cancer.  Skin cancer.  Lung cancer. What should I know about heart disease, diabetes, and high blood pressure? Blood pressure and heart disease  High blood pressure causes heart disease and increases the risk of stroke. This is more likely to develop in people who have high blood pressure readings, are of African descent, or are overweight.  Talk with your health care provider about your target blood pressure readings.  Have your blood pressure checked: ? Every 3-5 years if you are 37-2 years of age. ? Every year if you are 65 years old or older.  If you are between the ages of 73 and 49 and are a current or former smoker, ask your health care provider if you should have a one-time screening for abdominal aortic aneurysm (AAA). Diabetes Have regular diabetes screenings. This checks your fasting blood sugar level. Have the screening done:  Once every three years after age 73 if you are at a normal weight and have a low risk for diabetes.  More often and at a younger age if you are overweight or have a high risk for diabetes. What should I know about preventing infection? Hepatitis B If you have a higher risk for hepatitis  B, you should be screened for this virus. Talk with your health care provider to find out if you are at risk for hepatitis B infection. Hepatitis C Blood testing is recommended for:  Everyone born from [birth year] through 1966.  Anyone with known risk factors for hepatitis C. Sexually transmitted infections (STIs)  You should be screened each year for STIs, including gonorrhea and chlamydia, if: ? You are sexually active and are younger than 65 years of age. ? You are older than 65 years of age and your health care provider tells you that you are at risk for this type of infection. ? Your sexual activity has changed since you were last screened, and you are at increased risk for chlamydia or gonorrhea. Ask your health care provider if you are at risk.  Ask your  health care provider about whether you are at high risk for HIV. Your health care provider may recommend a prescription medicine to help prevent HIV infection. If you choose to take medicine to prevent HIV, you should first get tested for HIV. You should then be tested every 3 months for as long as you are taking the medicine. Follow these instructions at home: Lifestyle  Do not use any products that contain nicotine or tobacco, such as cigarettes, e-cigarettes, and chewing tobacco. If you need help quitting, ask your health care provider.  Do not use street drugs.  Do not share needles.  Ask your health care provider for help if you need support or information about quitting drugs. Alcohol use  Do not drink alcohol if your health care provider tells you not to drink.  If you drink alcohol: ? Limit how much you have to 0-2 drinks a day. ? Be aware of how much alcohol is in your drink. In the [place] one drink equals one 12 oz bottle of beer (355 mL), one 5 oz glass of wine (148 mL), or one 1 oz glass of hard liquor (44 mL). General instructions  Schedule regular health, dental, and eye exams.  Stay current with your vaccines.  Tell your health care provider if: ? You often feel depressed. ? You have ever been abused or do not feel safe at home. Summary  Adopting a healthy lifestyle and getting preventive care are important in promoting health and wellness.  Follow your health care provider's instructions about healthy diet, exercising, and getting tested or screened for diseases.  Follow your health care provider's instructions on monitoring your cholesterol and blood pressure. This information is not intended to replace advice given to you by your health care provider. Make sure you discuss any questions you have with your health care provider. Document Revised: 05/09/2018 Document Reviewed: 05/09/2018 Elsevier Patient Education  2020 [employer].

## 2019-07-22 DIAGNOSIS — I1310 Hypertensive heart and chronic kidney disease without heart failure, with stage 1 through stage 4 chronic kidney disease, or unspecified chronic kidney disease: Secondary | ICD-10-CM | POA: Diagnosis not present

## 2019-07-22 DIAGNOSIS — E1122 Type 2 diabetes mellitus with diabetic chronic kidney disease: Secondary | ICD-10-CM | POA: Diagnosis not present

## 2019-07-22 DIAGNOSIS — J449 Chronic obstructive pulmonary disease, unspecified: Secondary | ICD-10-CM | POA: Diagnosis not present

## 2019-07-22 DIAGNOSIS — N1832 Chronic kidney disease, stage 3b: Secondary | ICD-10-CM | POA: Diagnosis not present

## 2019-07-22 DIAGNOSIS — I2510 Atherosclerotic heart disease of native coronary artery without angina pectoris: Secondary | ICD-10-CM | POA: Diagnosis not present

## 2019-07-22 DIAGNOSIS — I890 Lymphedema, not elsewhere classified: Secondary | ICD-10-CM | POA: Diagnosis not present

## 2019-07-22 DIAGNOSIS — L97829 Non-pressure chronic ulcer of other part of left lower leg with unspecified severity: Secondary | ICD-10-CM | POA: Diagnosis not present

## 2019-07-22 DIAGNOSIS — L97819 Non-pressure chronic ulcer of other part of right lower leg with unspecified severity: Secondary | ICD-10-CM | POA: Diagnosis not present

## 2019-07-22 DIAGNOSIS — E1151 Type 2 diabetes mellitus with diabetic peripheral angiopathy without gangrene: Secondary | ICD-10-CM | POA: Diagnosis not present

## 2019-07-23 ENCOUNTER — Other Ambulatory Visit: Payer: Self-pay

## 2019-07-23 ENCOUNTER — Ambulatory Visit (INDEPENDENT_AMBULATORY_CARE_PROVIDER_SITE_OTHER): Payer: Medicare HMO

## 2019-07-23 VITALS — BP 178/80 | HR 68 | Temp 97.7°F | Resp 22 | Ht 72.0 in | Wt 321.8 lb

## 2019-07-23 DIAGNOSIS — N179 Acute kidney failure, unspecified: Secondary | ICD-10-CM | POA: Insufficient documentation

## 2019-07-23 DIAGNOSIS — J9611 Chronic respiratory failure with hypoxia: Secondary | ICD-10-CM

## 2019-07-23 DIAGNOSIS — J449 Chronic obstructive pulmonary disease, unspecified: Secondary | ICD-10-CM

## 2019-07-23 DIAGNOSIS — I2510 Atherosclerotic heart disease of native coronary artery without angina pectoris: Secondary | ICD-10-CM

## 2019-07-23 DIAGNOSIS — L97919 Non-pressure chronic ulcer of unspecified part of right lower leg with unspecified severity: Secondary | ICD-10-CM

## 2019-07-23 DIAGNOSIS — N3942 Incontinence without sensory awareness: Secondary | ICD-10-CM

## 2019-07-23 DIAGNOSIS — J9610 Chronic respiratory failure, unspecified whether with hypoxia or hypercapnia: Secondary | ICD-10-CM | POA: Insufficient documentation

## 2019-07-23 DIAGNOSIS — E1149 Type 2 diabetes mellitus with other diabetic neurological complication: Secondary | ICD-10-CM | POA: Diagnosis not present

## 2019-07-23 DIAGNOSIS — N401 Enlarged prostate with lower urinary tract symptoms: Secondary | ICD-10-CM

## 2019-07-23 DIAGNOSIS — Z6841 Body Mass Index (BMI) 40.0 and over, adult: Secondary | ICD-10-CM

## 2019-07-23 DIAGNOSIS — E11622 Type 2 diabetes mellitus with other skin ulcer: Secondary | ICD-10-CM | POA: Diagnosis not present

## 2019-07-23 DIAGNOSIS — G4733 Obstructive sleep apnea (adult) (pediatric): Secondary | ICD-10-CM

## 2019-07-23 DIAGNOSIS — G40A09 Absence epileptic syndrome, not intractable, without status epilepticus: Secondary | ICD-10-CM

## 2019-07-23 DIAGNOSIS — L98499 Non-pressure chronic ulcer of skin of other sites with unspecified severity: Secondary | ICD-10-CM

## 2019-07-23 DIAGNOSIS — I25118 Atherosclerotic heart disease of native coronary artery with other forms of angina pectoris: Secondary | ICD-10-CM

## 2019-07-23 DIAGNOSIS — Z7901 Long term (current) use of anticoagulants: Secondary | ICD-10-CM

## 2019-07-23 DIAGNOSIS — N183 Chronic kidney disease, stage 3 unspecified: Secondary | ICD-10-CM

## 2019-07-23 DIAGNOSIS — Z0000 Encounter for general adult medical examination without abnormal findings: Secondary | ICD-10-CM | POA: Diagnosis not present

## 2019-07-23 DIAGNOSIS — L97929 Non-pressure chronic ulcer of unspecified part of left lower leg with unspecified severity: Secondary | ICD-10-CM

## 2019-07-23 DIAGNOSIS — E1140 Type 2 diabetes mellitus with diabetic neuropathy, unspecified: Secondary | ICD-10-CM

## 2019-07-23 DIAGNOSIS — N400 Enlarged prostate without lower urinary tract symptoms: Secondary | ICD-10-CM | POA: Insufficient documentation

## 2019-07-23 DIAGNOSIS — I87312 Chronic venous hypertension (idiopathic) with ulcer of left lower extremity: Secondary | ICD-10-CM | POA: Insufficient documentation

## 2019-07-23 DIAGNOSIS — E782 Mixed hyperlipidemia: Secondary | ICD-10-CM

## 2019-07-23 DIAGNOSIS — I87311 Chronic venous hypertension (idiopathic) with ulcer of right lower extremity: Secondary | ICD-10-CM

## 2019-07-23 DIAGNOSIS — E1122 Type 2 diabetes mellitus with diabetic chronic kidney disease: Secondary | ICD-10-CM | POA: Insufficient documentation

## 2019-07-23 HISTORY — DX: Non-pressure chronic ulcer of unspecified part of left lower leg with unspecified severity: L97.929

## 2019-07-23 HISTORY — DX: Benign prostatic hyperplasia without lower urinary tract symptoms: N40.0

## 2019-07-23 HISTORY — DX: Chronic kidney disease, stage 3 unspecified: N18.30

## 2019-07-23 HISTORY — DX: Chronic obstructive pulmonary disease, unspecified: J44.9

## 2019-07-23 HISTORY — DX: Body Mass Index (BMI) 40.0 and over, adult: Z684

## 2019-07-23 HISTORY — DX: Mixed hyperlipidemia: E78.2

## 2019-07-23 HISTORY — DX: Morbid (severe) obesity due to excess calories: E66.01

## 2019-07-23 HISTORY — DX: Acute kidney failure, unspecified: N17.9

## 2019-07-23 HISTORY — DX: Incontinence without sensory awareness: N39.42

## 2019-07-23 HISTORY — DX: Absence epileptic syndrome, not intractable, without status epilepticus: G40.A09

## 2019-07-23 HISTORY — DX: Long term (current) use of anticoagulants: Z79.01

## 2019-07-23 HISTORY — DX: Chronic venous hypertension (idiopathic) with ulcer of left lower extremity: I87.312

## 2019-07-23 HISTORY — DX: Obstructive sleep apnea (adult) (pediatric): G47.33

## 2019-07-23 HISTORY — DX: Atherosclerotic heart disease of native coronary artery without angina pectoris: I25.10

## 2019-07-23 HISTORY — DX: Chronic respiratory failure with hypoxia: J96.11

## 2019-07-23 HISTORY — DX: Type 2 diabetes mellitus with diabetic neuropathy, unspecified: E11.40

## 2019-07-23 NOTE — Progress Notes (Signed)
Subjective:   Matthew Gutierrez is a 64 y.o. male who presents for Medicare Annual/Subsequent preventive examination. This wellness visit is conducted by a nurse.  The patient's medications were reviewed and reconciled since the patient's last visit.  History details were provided by the patient.  The history appears to be reliable.    Patient's last AWV was one year ago.   Medical History: Patient history and Family history was reviewed  Medications, Allergies, and preventative health maintenance was reviewed and updated.  Review of Systems:  Review of Systems  Constitutional: Negative.   HENT: Negative.   Eyes: Negative.   Respiratory: Positive for shortness of breath. Negative for cough and wheezing.   Cardiovascular: Positive for leg swelling. Negative for chest pain.  Gastrointestinal: Positive for diarrhea.  Neurological: Positive for weakness. Negative for dizziness and headaches.  Psychiatric/Behavioral: Negative for sleep disturbance and suicidal ideas.   Cardiac Risk Factors include: advanced age (>79men, >64 women);diabetes mellitus;obesity (BMI >30kg/m2);sedentary lifestyle     Objective:    Vitals: BP (!) 178/80 (BP Location: Left Arm, Patient Position: Sitting)   Pulse 68   Temp 97.7 F (36.5 C) (Temporal)   Resp (!) 22   Ht 6' (1.829 m)   Wt (!) 321 lb 12.8 oz (146 kg)   SpO2 92%   BMI 43.64 kg/m   Body mass index is 43.64 kg/m.  No flowsheet data found.  Tobacco Social History   Tobacco Use  Smoking Status Former Smoker  . Quit date: 10/28/2009  . Years since quitting: 9.7  Smokeless Tobacco Never Used       Clinical Intake:  Pre-visit preparation completed: Yes  Pain : No/denies pain Pain Score: 0-No pain     Nutritional Status: BMI > 30  Obese Diabetes: Yes CBG done?: No Did pt. bring in CBG monitor from home?: No, states home blood sugar runs 150-170  How often do you need to have someone help you when you read instructions, pamphlets,  or other written materials from your doctor or pharmacy?: 2 - Rarely  Interpreter Needed?: No     Past Medical History:  Diagnosis Date  . Diabetes mellitus   . DVT (deep venous thrombosis) ([place])   . Epilepsy, grand mal ([place])   . Frozen shoulder    Right Shoulder  . Hyperlipidemia   . Hypertension   . Lumbago   . Peripheral vascular disease ([place])   . Stroke ([place])   . Unspecified hereditary and idiopathic peripheral neuropathy    Past Surgical History:  Procedure Laterality Date  . CORONARY STENT PLACEMENT     Family History  Problem Relation Age of Onset  . Cancer Mother   . Stroke Father   . Hypertension Other   . Stroke Other   . Hyperlipidemia Other   . Alcohol abuse Other   . Heart disease Other   . Cancer Other    Social History   Socioeconomic History  . Marital status: Divorced    Spouse name: Not on file  . Number of children: 2  . Years of education: Not on file  . Highest education level: Not on file  Occupational History  . Occupation: Disability  Tobacco Use  . Smoking status: Former Smoker    Quit date: 10/28/2009    Years since quitting: 9.7  . Smokeless tobacco: Never Used  Substance and Sexual Activity  . Alcohol use: Yes    Comment: occassionally  . Drug use: No  . Sexual activity: Not  Currently  Other Topics Concern  . Not on file  Social History Narrative  . Not on file   Social Determinants of Health   Financial Resource Strain:   . Difficulty of Paying Living Expenses: Not on file  Food Insecurity:   . Worried About Charity fundraiser in the Last Year: Not on file  . Ran Out of Food in the Last Year: Not on file  Transportation Needs:   . Lack of Transportation (Medical): Not on file  . Lack of Transportation (Non-Medical): Not on file  Physical Activity: Inactive  . Days of Exercise per Week: 0 days  . Minutes of Exercise per Session: 0 min  Stress:   . Feeling of Stress : Not on file  Social Connections:   . Frequency  of Communication with Friends and Family: Not on file  . Frequency of Social Gatherings with Friends and Family: Not on file  . Attends Religious Services: Not on file  . Active Member of Clubs or Organizations: Not on file  . Attends Archivist Meetings: Not on file  . Marital Status: Not on file    Outpatient Encounter Medications as of 07/23/2019  Medication Sig  . atenolol-chlorthalidone (TENORETIC) 100-25 MG per tablet Take 1 tablet by mouth daily.  . furosemide (LASIX) 20 MG tablet Take 20 mg by mouth daily.  Marland Kitchen gabapentin (NEURONTIN) 300 MG capsule Take 300 mg by mouth at bedtime.  Marland Kitchen glimepiride (AMARYL) 2 MG tablet Take 2 mg by mouth as needed.   . hydrALAZINE (APRESOLINE) 10 MG tablet Take 10 mg by mouth. Take two tablets twice daily  . lisinopril (ZESTRIL) 10 MG tablet Take 10 mg by mouth daily.  . magnesium oxide (MAG-OX) 400 MG tablet Take 400 mg by mouth 2 (two) times daily.  . metFORMIN (GLUCOPHAGE) 1000 MG tablet Take 1,000 mg by mouth 2 (two) times daily with a meal.  . metFORMIN (GLUCOPHAGE) 1000 MG tablet Take 1,000 mg by mouth 2 (two) times daily with a meal.  . Omega-3 Fatty Acids (RA FISH OIL) 1000 MG CPDR Take by mouth.  . oxyCODONE-acetaminophen (PERCOCET) 10-325 MG per tablet Take 1 tablet by mouth every 6 (six) hours as needed for pain.  . phenytoin (DILANTIN) 100 MG ER capsule Take 200 mg by mouth 3 (three) times daily.  . phenytoin (DILANTIN) 100 MG ER capsule Take 100 mg by mouth. Take three capsules twice daily  . pravastatin (PRAVACHOL) 20 MG tablet Take 20 mg by mouth daily.  . pravastatin (PRAVACHOL) 40 MG tablet Take 40 mg by mouth daily.  Marland Kitchen triamcinolone cream (KENALOG) 0.1 %   . warfarin (COUMADIN) 4 MG tablet Take 4 mg by mouth daily. Takes Coumadin 4 mg M/W/F/Sunday and Coumadin 3 mg T/TH/Saturday.  .  . warfarin (COUMADIN) 4 MG tablet Take 4 mg by mouth daily.  . [DISCONTINUED] aspirin 81 MG tablet Take 81 mg by mouth daily.  . [DISCONTINUED]  celecoxib (CELEBREX) 200 MG capsule Take 200 mg by mouth as needed.   . [DISCONTINUED] clopidogrel (PLAVIX) 75 MG tablet Take 75 mg by mouth daily.  . [DISCONTINUED] lisinopril (PRINIVIL,ZESTRIL) 20 MG tablet Take 10 mg by mouth daily.    No facility-administered encounter medications on file as of 07/23/2019.    Activities of Daily Living In your present state of health, do you have any difficulty performing the following activities: 07/23/2019  Hearing? N  Vision? N  Difficulty concentrating or making decisions? N  Walking or climbing  stairs? Y  Dressing or bathing? Y  Doing errands, shopping? Y  Preparing Food and eating ? N  Using the Toilet? N  In the past six months, have you accidently leaked urine? Y  Do you have problems with loss of bowel control? Y  Managing your Medications? N  Managing your Finances? N  Housekeeping or managing your Housekeeping? Y  Some recent data might be hidden    Patient Care Team: Lillard Anes, MD as PCP - General (Family Medicine) Misenheimer, Christia Reading, MD as Consulting Physician (Gastroenterology) [place] ([place])   Assessment:   This is a routine wellness examination for Braeden.  Exercise Activities and Dietary recommendations Current Exercise Habits: The patient does not participate in regular exercise at present, Exercise limited by: respiratory conditions(s);Other - see comments  Goals   Gain control of bowel and bladder     Fall Risk Fall Risk  07/23/2019  Falls in the past year? 0  Risk for fall due to : Impaired balance/gait  Follow up Falls prevention discussed   Is the patient's home free of loose throw rugs in walkways, pet beds, electrical cords, etc?   yes      Grab bars in the bathroom? yes      Handrails on the stairs?   yes      Adequate lighting?   yes    Depression Screen PHQ 2/9 Scores 07/23/2019  PHQ - 2 Score 3  PHQ- 9 Score 7    Cognitive Function       6CIT Screen 07/23/2019  What Year? 0 points  What month? 0 points  What time? 0 points  Count back from 20 0 points  Months in reverse 2 points  Repeat phrase 0 points  Total Score 2    Immunization History  Administered Date(s) Administered  . Influenza Inj Mdck Quad With Preservative 06/07/2019  . Pneumococcal Polysaccharide-23 12/14/2011, 01/09/2014    Screening Tests Health Maintenance  Topic Date Due  . Hepatitis C Screening  1956-03-14  . FOOT EXAM  12/19/1964  . TETANUS/TDAP  12/19/1973  . OPHTHALMOLOGY EXAM  07/25/2019  . HEMOGLOBIN A1C  12/05/2019  . COLONOSCOPY  04/22/2024  . INFLUENZA VACCINE  Completed  . HIV Screening  Discontinued   Cancer Screenings: Lung: Low Dose CT Chest recommended if Age 36-80 years, 30 pack-year currently smoking OR have quit w/in 15years. Patient does not qualify. Colorectal: last colonoscopy was 03/2014        Plan:     Patient receives home health nursing which comes out to wrap bilateral legs.  He has hired someone to come out to do light housework.  He admits that he does not eat as well as he should.  His last A1C had increased to 8.4 (in January 2021).  Last diabetic eye exam was 06/2018, referred to Dr Hulan Saas for yearly exam.  Patient is currently up to date on vaccines, recommended influenza vaccine yearly and the COVID-19 vaccine when he is eligible.    Dorien has concerns with worsening bowel incontinence, appointment made with Dr Henrene Pastor for this week.  Patient is not a candidate for our Chronic Care Management Program due to currently receiving home health services.  Patient expressed some depression due to his increase in physical limitations.  He was given education.   I have personally reviewed and noted the following in the patient's chart:   . Medical and social history . Use of alcohol, tobacco or illicit  drugs  . Current medications and supplements (unsure of current medications, he is to call back to review  when he gets home) . Functional ability and status . Nutritional status . Physical activity . Advanced directives . List of other physicians . Hospitalizations, surgeries, and ER visits in previous 12 months . Vitals . Screenings to include cognitive, depression, and falls . Referrals and appointments  In addition, I have reviewed and discussed with patient certain preventive protocols, quality metrics, and best practice recommendations. A written personalized care plan for preventive services as well as general preventive health recommendations were provided to patient.     Erie Noe, LPN  [id number]

## 2019-07-24 ENCOUNTER — Other Ambulatory Visit: Payer: Self-pay

## 2019-07-24 DIAGNOSIS — Z7901 Long term (current) use of anticoagulants: Secondary | ICD-10-CM

## 2019-07-24 DIAGNOSIS — Z86718 Personal history of other venous thrombosis and embolism: Secondary | ICD-10-CM

## 2019-07-24 LAB — PROTIME-INR
INR: 3.6 — ABNORMAL HIGH (ref 0.9–1.2)
Prothrombin Time: 36.5 s — ABNORMAL HIGH (ref 9.1–12.0)

## 2019-07-24 MED ORDER — WARFARIN SODIUM 3 MG PO TABS: 3.0000 mg | ORAL_TABLET | Freq: Every day | ORAL | 0 refills | Status: DC

## 2019-07-24 NOTE — Progress Notes (Signed)
INR high, decrease to 3mg  a day of coumadin and recheck in 2 weeks lp

## 2019-07-25 ENCOUNTER — Other Ambulatory Visit: Payer: Self-pay

## 2019-07-25 ENCOUNTER — Encounter: Payer: Self-pay | Admitting: Legal Medicine

## 2019-07-25 ENCOUNTER — Ambulatory Visit (INDEPENDENT_AMBULATORY_CARE_PROVIDER_SITE_OTHER): Payer: Medicare HMO | Admitting: Legal Medicine

## 2019-07-25 VITALS — BP 140/90 | HR 70 | Temp 97.5°F | Ht 71.0 in | Wt 324.2 lb

## 2019-07-25 DIAGNOSIS — M7552 Bursitis of left shoulder: Secondary | ICD-10-CM

## 2019-07-25 DIAGNOSIS — M7550 Bursitis of unspecified shoulder: Secondary | ICD-10-CM | POA: Insufficient documentation

## 2019-07-25 DIAGNOSIS — R32 Unspecified urinary incontinence: Secondary | ICD-10-CM | POA: Insufficient documentation

## 2019-07-25 DIAGNOSIS — Z7901 Long term (current) use of anticoagulants: Secondary | ICD-10-CM

## 2019-07-25 DIAGNOSIS — E1143 Type 2 diabetes mellitus with diabetic autonomic (poly)neuropathy: Secondary | ICD-10-CM | POA: Diagnosis not present

## 2019-07-25 DIAGNOSIS — M7551 Bursitis of right shoulder: Secondary | ICD-10-CM | POA: Diagnosis not present

## 2019-07-25 DIAGNOSIS — N3942 Incontinence without sensory awareness: Secondary | ICD-10-CM | POA: Diagnosis not present

## 2019-07-25 HISTORY — DX: Bursitis of unspecified shoulder: M75.50

## 2019-07-25 HISTORY — DX: Unspecified urinary incontinence: R32

## 2019-07-25 MED ORDER — WARFARIN SODIUM 4 MG PO TABS: 4.0000 mg | ORAL_TABLET | Freq: Every day | ORAL | 2 refills | Status: DC

## 2019-07-25 MED ORDER — OXYCODONE-ACETAMINOPHEN 10-325 MG PO TABS: 1.0000 | ORAL_TABLET | Freq: Four times a day (QID) | ORAL | 0 refills | Status: DC | PRN

## 2019-07-25 NOTE — Assessment & Plan Note (Signed)
AN INDIVIDUAL CARE PLAN was established and reinforced today.  The patient's status was assessed using clinical findings on exam, labs, and other diagnostic testing. Patient's success at meeting treatment goals based on disease specific evidence-bassed guidelines and found to be in good control. RECOMMENDATIONS include maintaining present medicines and treatment. 

## 2019-07-25 NOTE — Assessment & Plan Note (Signed)
I worry patient may now have autonomic nerve damage (E11.43)  I will refer to urology and GI for work up.  Stop all bowel laxatives and magnesium.

## 2019-07-25 NOTE — Assessment & Plan Note (Signed)
Patient has been having urinary incontinence for one year and wears diaper.  Refer to Dr. Comer Locket, this may be autonomic dysfunction as well as structural with retracted penis.

## 2019-07-25 NOTE — Assessment & Plan Note (Signed)
We discussed how to take warfarin.  He was taking wrong, he is to be on 4mg  M, W, F and 3mg  other days, recheck in 2 weeks.

## 2019-07-25 NOTE — Progress Notes (Signed)
Established Patient Office Visit  Subjective:  Patient ID: Matthew Gutierrez, male    DOB: 02/23/1956  Age: 64 y.o. MRN: [id number]  CC:  Chief Complaint  Patient presents with  . Diarrhea  . Urinary Incontinence    HPI [name] presents for for incontinence or stool and bladder.  He has a long history of DM and has renal disease and peripheral neuropathy. He soils his pants and has very loose stools.  he gets a lot of gas after eating.  This started when he was at [facility].  He is also is having  Urinary incontinence and wears diaper for 6 months.  His last colonoscopy was 2 years ago with dr. Odie Sera and was OK.  Past Medical History:  Diagnosis Date  . Diabetes mellitus   . DVT (deep venous thrombosis) ([place])   . Epilepsy, grand mal ([place])   . Frozen shoulder    Right Shoulder  . Hyperlipidemia   . Hypertension   . Lumbago   . Peripheral vascular disease ([place])   . Stroke ([place])   . Unspecified hereditary and idiopathic peripheral neuropathy     Past Surgical History:  Procedure Laterality Date  . CORONARY STENT PLACEMENT      Family History  Problem Relation Age of Onset  . Cancer Mother   . Stroke Father   . Hypertension Other   . Stroke Other   . Hyperlipidemia Other   . Alcohol abuse Other   . Heart disease Other   . Cancer Other     Social History   Socioeconomic History  . Marital status: Divorced    Spouse name: Not on file  . Number of children: 2  . Years of education: Not on file  . Highest education level: Not on file  Occupational History  . Occupation: Disability  Tobacco Use  . Smoking status: Former Smoker    Quit date: 10/28/2009    Years since quitting: 9.7  . Smokeless tobacco: Never Used  Substance and Sexual Activity  . Alcohol use: Yes    Comment: occassionally  . Drug use: No  . Sexual activity: Not Currently  Other Topics Concern  . Not on file  Social History Narrative  . Not on file   Social Determinants of  Health   Financial Resource Strain:   . Difficulty of Paying Living Expenses: Not on file  Food Insecurity:   . Worried About Charity fundraiser in the Last Year: Not on file  . Ran Out of Food in the Last Year: Not on file  Transportation Needs:   . Lack of Transportation (Medical): Not on file  . Lack of Transportation (Non-Medical): Not on file  Physical Activity: Inactive  . Days of Exercise per Week: 0 days  . Minutes of Exercise per Session: 0 min  Stress:   . Feeling of Stress : Not on file  Social Connections:   . Frequency of Communication with Friends and Family: Not on file  . Frequency of Social Gatherings with Friends and Family: Not on file  . Attends Religious Services: Not on file  . Active Member of Clubs or Organizations: Not on file  . Attends Archivist Meetings: Not on file  . Marital Status: Not on file  Intimate Partner Violence: Not At Risk  . Fear of Current or Ex-Partner: No  . Emotionally Abused: No  . Physically Abused: No  . Sexually Abused: No    Outpatient Medications Prior to  Visit  Medication Sig Dispense Refill  . atenolol-chlorthalidone (TENORETIC) 100-25 MG per tablet Take 1 tablet by mouth daily.    . furosemide (LASIX) 20 MG tablet Take 20 mg by mouth daily.    Marland Kitchen gabapentin (NEURONTIN) 300 MG capsule Take 300 mg by mouth at bedtime.    . hydrALAZINE (APRESOLINE) 10 MG tablet Take 10 mg by mouth. Take two tablets twice daily    . lisinopril (ZESTRIL) 10 MG tablet Take 10 mg by mouth daily.    . metFORMIN (GLUCOPHAGE) 1000 MG tablet Take 1,000 mg by mouth 2 (two) times daily with a meal.    . Omega-3 Fatty Acids (RA FISH OIL) 1000 MG CPDR Take by mouth.    . phenytoin (DILANTIN) 100 MG ER capsule Take 100 mg by mouth. Take three capsules twice daily    . pravastatin (PRAVACHOL) 40 MG tablet Take 40 mg by mouth daily.    Marland Kitchen triamcinolone cream (KENALOG) 0.1 %     . warfarin (COUMADIN) 3 MG tablet Take 1 tablet (3 mg total) by mouth  daily. 30 tablet 0  . magnesium oxide (MAG-OX) 400 MG tablet Take 400 mg by mouth 2 (two) times daily.    Marland Kitchen oxyCODONE-acetaminophen (PERCOCET) 10-325 MG per tablet Take 1 tablet by mouth every 6 (six) hours as needed for pain.    Marland Kitchen warfarin (COUMADIN) 4 MG tablet Take 4 mg by mouth daily.    Marland Kitchen glimepiride (AMARYL) 2 MG tablet Take 2 mg by mouth as needed.     . metFORMIN (GLUCOPHAGE) 1000 MG tablet Take 1,000 mg by mouth 2 (two) times daily with a meal.    . phenytoin (DILANTIN) 100 MG ER capsule Take 200 mg by mouth 3 (three) times daily.    . pravastatin (PRAVACHOL) 20 MG tablet Take 20 mg by mouth daily.     No facility-administered medications prior to visit.    Allergies  Allergen Reactions  . Codeine     ROS Review of Systems  Constitutional: Negative.   HENT: Negative.   Eyes: Negative.   Respiratory: Negative.   Cardiovascular: Negative.   Gastrointestinal: Positive for constipation and diarrhea.  Genitourinary: Positive for urgency.  Musculoskeletal: Negative.       Objective:    Physical Exam  Constitutional: He appears well-developed and well-nourished.  HENT:  Head: Normocephalic and atraumatic.  Eyes: Pupils are equal, round, and reactive to light. Conjunctivae and EOM are normal.  Cardiovascular: Normal rate, regular rhythm and normal heart sounds.  Pulmonary/Chest: Effort normal and breath sounds normal.  Abdominal: Soft. Normal appearance and bowel sounds are normal. There is generalized abdominal tenderness.  Genitourinary:    Genitourinary Comments: Penis retracts beyond the abdominal folds   Musculoskeletal:     Cervical back: Normal range of motion and neck supple.  Vitals reviewed.   BP 140/90   Pulse 70   Temp (!) 97.5 F (36.4 C)   Ht 5' 11"  (1.803 m)   Wt (!) 324 lb 3.2 oz (147.1 kg)   SpO2 (!) 88%   BMI 45.22 kg/m  Wt Readings from Last 3 Encounters:  07/25/19 (!) 324 lb 3.2 oz (147.1 kg)  07/23/19 (!) 321 lb 12.8 oz (146 kg)    06/07/19 (!) 330 lb (149.7 kg)     Health Maintenance Due  Topic Date Due  . Hepatitis C Screening  1955/07/23  . TETANUS/TDAP  12/19/1973  . OPHTHALMOLOGY EXAM  07/25/2019    There are no preventive care reminders  to display for this patient.  No results found for: TSH No results found for: WBC, HGB, HCT, MCV, PLT No results found for: NA, K, CHLORIDE, CO2, GLUCOSE, BUN, CREATININE, BILITOT, ALKPHOS, AST, ALT, PROT, ALBUMIN, CALCIUM, ANIONGAP, EGFR, GFR No results found for: CHOL No results found for: HDL No results found for: LDLCALC No results found for: TRIG No results found for: CHOLHDL Lab Results  Component Value Date   HGBA1C 8.4 06/07/2019      Assessment & Plan:   Problem List Items Addressed This Visit      Endocrine   Diabetic neuropathy ([place]) - Primary    I worry patient may now have autonomic nerve damage (E11.43)  I will refer to urology and GI for work up.  Stop all bowel laxatives and magnesium.      Relevant Orders   Ambulatory referral to Gastroenterology   Ambulatory referral to Urology     Musculoskeletal and Integument   Shoulder bursitis    AN INDIVIDUAL CARE PLAN was established and reinforced today.  The patient's status was assessed using clinical findings on exam, labs, and other diagnostic testing. Patient's success at meeting treatment goals based on disease specific evidence-bassed guidelines and found to be in good control. RECOMMENDATIONS include maintaining present medicines and treatment.      Relevant Medications   oxyCODONE-acetaminophen (PERCOCET) 10-325 MG tablet     Other   Long term (current) use of anticoagulants    We discussed how to take warfarin.  He was taking wrong, he is to be on 50m M, W, F and 367mother days, recheck in 2 weeks.      Incontinence without sensory awareness   Urinary incontinence    Patient has been having urinary incontinence for one year and wears diaper.  Refer to Dr. CaComer Locketthis may be  autonomic dysfunction as well as structural with retracted penis.         Meds ordered this encounter  Medications  . oxyCODONE-acetaminophen (PERCOCET) 10-325 MG tablet    Sig: Take 1 tablet by mouth every 6 (six) hours as needed for pain.    Dispense:  30 tablet    Refill:  0  . warfarin (COUMADIN) 4 MG tablet    Sig: Take 1 tablet (4 mg total) by mouth daily.    Dispense:  90 tablet    Refill:  2    Follow-up: Return if symptoms worsen or fail to improve.    LaReinaldo MeekerMD

## 2019-07-25 NOTE — Patient Instructions (Addendum)
Stop magnesium oxide Stop all stool softeners and laxatives  Take warfarin 4mg  Monday, Wednesday and Friday, take 3mg  the other 4 days    Fecal Incontinence Fecal incontinence, also called accidental bowel leakage, is not being able to control your bowels. This condition happens because the nerves or muscles around the anus do not work the way they should. This affects their ability to hold stool (feces). What are the causes? This condition may be caused by:  Damage to the muscles at the end of the rectum (sphincter).  Damage to the nerves that control bowel movements.  Diarrhea.  Chronic constipation.  Pelvic floor dysfunction. This means the muscles in the pelvis do not work well.  Loss of bowel storage capacity. This occurs when the rectum can no longer stretch in size in order to store feces.  Inflammatory bowel disease (IBD), such as Crohn's disease.  Irritable bowel syndrome (IBS). What increases the risk? You are more likely to develop this condition if you:  Were born with bowels or a pelvis that did not form correctly.  Have had rectal surgery.  Have had radiation treatment for certain cancers.  Have been pregnant, had a vaginal delivery, or had surgery that damaged the pelvic floor muscles.  Had a complicated childbirth, spinal cord injury, or other trauma that caused nerve damage.  Have a condition that can affect nerve function, such as diabetes, Parkinson's disease, or multiple sclerosis.  Have a condition where the rectum drops down into the anus or vagina (prolapse).  Are 61 years of age or older. What are the signs or symptoms? The main symptom of this condition is not being able to control your bowels. You also might not be able to get to the bathroom before a bowel movement. How is this diagnosed? This condition is diagnosed with a medical history and physical exam. You may also have other tests, including:  Blood tests.  Urine tests.  A rectal  exam.  Ultrasound.  MRI.  Colonoscopy. This is an exam that looks at your large intestine (colon).  Anal manometry. This is a test that measures the strength of the anal sphincter.  Anal electromyogram (EMG). This is a test that uses small electrodes to check for nerve damage. How is this treated? Treatment for this condition depends on the cause and severity. Treatment may also focus on addressing any underlying causes of this condition. Treatment may include:  Medicines. This may include medicines to: ? Prevent diarrhea. ? Help with constipation (bulk-forming laxatives). ? Treat any underlying conditions.  Biofeedback therapy. This can help to retrain muscles that are affected.  Fiber supplements. These can help manage your bowel movements.  Nerve stimulation.  Injectable gel to promote tissue growth and better muscle control.  Surgery. You may need: ? Sphincter repair surgery. ? Diversion surgery. This procedure lets feces pass out of your body through a hole in your abdomen. Follow these instructions at home: Eating and drinking   Follow instructions from your health care provider about any eating or drinking restrictions. ? Work with a dietitian to come up with a healthy diet that will help you avoid the foods that can make your condition worse. ? Keep a diet diary to find out which foods or drinks could be making your condition worse.  Drink enough fluid to keep your urine pale yellow. Lifestyle  Do not use any products that contain nicotine or tobacco, such as cigarettes and e-cigarettes. If you need help quitting, ask your health care  provider. This may help your condition.  If you are overweight, talk with your health care provider about how to safely lose weight. This may help your condition.  Increase your physical activity as told by your health care provider. This may help your condition. Always talk with your health care provider before starting a new  exercise program.  Carry a change of clothes and supplies to clean up quickly if you have an episode of fetal incontinence.  Consider joining a fecal incontinence support group. You can find a support group online or in your local community. General instructions   Take over-the-counter and prescription medicines only as told by your health care provider. This includes any supplements.  Apply a moisture barrier, such as petroleum jelly, to your rectum. This protects the skin from irritation caused by ongoing leaking or diarrhea.  Tell your health care provider if you are upset or depressed about your condition.  Keep all follow-up visits as told by your health care provider. This is important. Where to find more information  International Foundation for Functional Gastrointestinal Disorders: iffgd.[place] [facility] G[facility]: patients.gi.org Contact a health care provider if:  You have a fever.  You have redness, swelling, or pain around your rectum.  Your pain is getting worse or you lose feeling in your rectal area.  You have blood in your stool.  You feel sad or hopeless.  You avoid social or work situations. Get help right away if:  You stop having bowel movements.  You cannot eat or drink without vomiting.  You have rectal bleeding that does not stop.  You have severe pain that is getting worse.  You have symptoms of dehydration, including: ? Sleepiness or fatigue. ? Producing little or no urine, tears, or sweat. ? Dizziness. ? Dry mouth. ? Unusual irritability. ? Headache. ? Inability to think clearly. Summary  Fecal incontinence, also called accidental bowel leakage, is not being able to control your bowels. This condition happens because the nerves or muscles around the anus do not work the way they should.  Treatment varies depending on the cause and severity of your condition. Treatment may also focus on addressing any underlying causes of  this condition.  Follow instructions from your health care provider about any eating or drinking restrictions, lifestyle changes, and skin care.  Take over-the-counter and prescription medicines only as told by your health care provider. This includes any supplements.  Tell your health care provider if your symptoms worsen or if you are upset or depressed about your condition. This information is not intended to replace advice given to you by your health care provider. Make sure you discuss any questions you have with your health care provider. Document Revised: 09/28/2017 Document Reviewed: 09/28/2017 Elsevier Patient Education  [place].

## 2019-07-26 DIAGNOSIS — I2510 Atherosclerotic heart disease of native coronary artery without angina pectoris: Secondary | ICD-10-CM | POA: Diagnosis not present

## 2019-07-26 DIAGNOSIS — L97819 Non-pressure chronic ulcer of other part of right lower leg with unspecified severity: Secondary | ICD-10-CM | POA: Diagnosis not present

## 2019-07-26 DIAGNOSIS — N1832 Chronic kidney disease, stage 3b: Secondary | ICD-10-CM | POA: Diagnosis not present

## 2019-07-26 DIAGNOSIS — I1310 Hypertensive heart and chronic kidney disease without heart failure, with stage 1 through stage 4 chronic kidney disease, or unspecified chronic kidney disease: Secondary | ICD-10-CM | POA: Diagnosis not present

## 2019-07-26 DIAGNOSIS — J449 Chronic obstructive pulmonary disease, unspecified: Secondary | ICD-10-CM | POA: Diagnosis not present

## 2019-07-26 DIAGNOSIS — E1122 Type 2 diabetes mellitus with diabetic chronic kidney disease: Secondary | ICD-10-CM | POA: Diagnosis not present

## 2019-07-26 DIAGNOSIS — E1151 Type 2 diabetes mellitus with diabetic peripheral angiopathy without gangrene: Secondary | ICD-10-CM | POA: Diagnosis not present

## 2019-07-26 DIAGNOSIS — L97829 Non-pressure chronic ulcer of other part of left lower leg with unspecified severity: Secondary | ICD-10-CM | POA: Diagnosis not present

## 2019-07-26 DIAGNOSIS — I890 Lymphedema, not elsewhere classified: Secondary | ICD-10-CM | POA: Diagnosis not present

## 2019-07-30 DIAGNOSIS — I2510 Atherosclerotic heart disease of native coronary artery without angina pectoris: Secondary | ICD-10-CM | POA: Diagnosis not present

## 2019-07-30 DIAGNOSIS — L97819 Non-pressure chronic ulcer of other part of right lower leg with unspecified severity: Secondary | ICD-10-CM | POA: Diagnosis not present

## 2019-07-30 DIAGNOSIS — I890 Lymphedema, not elsewhere classified: Secondary | ICD-10-CM | POA: Diagnosis not present

## 2019-07-30 DIAGNOSIS — E1151 Type 2 diabetes mellitus with diabetic peripheral angiopathy without gangrene: Secondary | ICD-10-CM | POA: Diagnosis not present

## 2019-07-30 DIAGNOSIS — L97829 Non-pressure chronic ulcer of other part of left lower leg with unspecified severity: Secondary | ICD-10-CM | POA: Diagnosis not present

## 2019-07-30 DIAGNOSIS — J449 Chronic obstructive pulmonary disease, unspecified: Secondary | ICD-10-CM | POA: Diagnosis not present

## 2019-07-30 DIAGNOSIS — N1832 Chronic kidney disease, stage 3b: Secondary | ICD-10-CM | POA: Diagnosis not present

## 2019-07-30 DIAGNOSIS — I1310 Hypertensive heart and chronic kidney disease without heart failure, with stage 1 through stage 4 chronic kidney disease, or unspecified chronic kidney disease: Secondary | ICD-10-CM | POA: Diagnosis not present

## 2019-07-30 DIAGNOSIS — E1122 Type 2 diabetes mellitus with diabetic chronic kidney disease: Secondary | ICD-10-CM | POA: Diagnosis not present

## 2019-08-02 DIAGNOSIS — E1122 Type 2 diabetes mellitus with diabetic chronic kidney disease: Secondary | ICD-10-CM | POA: Diagnosis not present

## 2019-08-02 DIAGNOSIS — E1151 Type 2 diabetes mellitus with diabetic peripheral angiopathy without gangrene: Secondary | ICD-10-CM | POA: Diagnosis not present

## 2019-08-02 DIAGNOSIS — L97819 Non-pressure chronic ulcer of other part of right lower leg with unspecified severity: Secondary | ICD-10-CM | POA: Diagnosis not present

## 2019-08-02 DIAGNOSIS — J449 Chronic obstructive pulmonary disease, unspecified: Secondary | ICD-10-CM | POA: Diagnosis not present

## 2019-08-02 DIAGNOSIS — I1310 Hypertensive heart and chronic kidney disease without heart failure, with stage 1 through stage 4 chronic kidney disease, or unspecified chronic kidney disease: Secondary | ICD-10-CM | POA: Diagnosis not present

## 2019-08-02 DIAGNOSIS — L97829 Non-pressure chronic ulcer of other part of left lower leg with unspecified severity: Secondary | ICD-10-CM | POA: Diagnosis not present

## 2019-08-02 DIAGNOSIS — I2510 Atherosclerotic heart disease of native coronary artery without angina pectoris: Secondary | ICD-10-CM | POA: Diagnosis not present

## 2019-08-02 DIAGNOSIS — N1832 Chronic kidney disease, stage 3b: Secondary | ICD-10-CM | POA: Diagnosis not present

## 2019-08-02 DIAGNOSIS — I890 Lymphedema, not elsewhere classified: Secondary | ICD-10-CM | POA: Diagnosis not present

## 2019-08-05 DIAGNOSIS — E1122 Type 2 diabetes mellitus with diabetic chronic kidney disease: Secondary | ICD-10-CM | POA: Diagnosis not present

## 2019-08-05 DIAGNOSIS — I1310 Hypertensive heart and chronic kidney disease without heart failure, with stage 1 through stage 4 chronic kidney disease, or unspecified chronic kidney disease: Secondary | ICD-10-CM | POA: Diagnosis not present

## 2019-08-05 DIAGNOSIS — I2510 Atherosclerotic heart disease of native coronary artery without angina pectoris: Secondary | ICD-10-CM | POA: Diagnosis not present

## 2019-08-05 DIAGNOSIS — N1832 Chronic kidney disease, stage 3b: Secondary | ICD-10-CM | POA: Diagnosis not present

## 2019-08-05 DIAGNOSIS — E1151 Type 2 diabetes mellitus with diabetic peripheral angiopathy without gangrene: Secondary | ICD-10-CM | POA: Diagnosis not present

## 2019-08-05 DIAGNOSIS — I890 Lymphedema, not elsewhere classified: Secondary | ICD-10-CM | POA: Diagnosis not present

## 2019-08-05 DIAGNOSIS — J449 Chronic obstructive pulmonary disease, unspecified: Secondary | ICD-10-CM | POA: Diagnosis not present

## 2019-08-05 DIAGNOSIS — L97819 Non-pressure chronic ulcer of other part of right lower leg with unspecified severity: Secondary | ICD-10-CM | POA: Diagnosis not present

## 2019-08-05 DIAGNOSIS — L97829 Non-pressure chronic ulcer of other part of left lower leg with unspecified severity: Secondary | ICD-10-CM | POA: Diagnosis not present

## 2019-08-07 ENCOUNTER — Other Ambulatory Visit: Payer: Self-pay

## 2019-08-07 ENCOUNTER — Other Ambulatory Visit: Payer: Medicare HMO

## 2019-08-07 DIAGNOSIS — Z7901 Long term (current) use of anticoagulants: Secondary | ICD-10-CM | POA: Diagnosis not present

## 2019-08-08 LAB — PROTIME-INR
INR: 2.1 — ABNORMAL HIGH (ref 0.9–1.2)
Prothrombin Time: 21.7 s — ABNORMAL HIGH (ref 9.1–12.0)

## 2019-08-08 NOTE — Progress Notes (Signed)
INR 2.1 good control, recheck in one month lp

## 2019-08-11 DIAGNOSIS — J9601 Acute respiratory failure with hypoxia: Secondary | ICD-10-CM | POA: Diagnosis not present

## 2019-08-11 DIAGNOSIS — J449 Chronic obstructive pulmonary disease, unspecified: Secondary | ICD-10-CM | POA: Diagnosis not present

## 2019-08-12 DIAGNOSIS — L97819 Non-pressure chronic ulcer of other part of right lower leg with unspecified severity: Secondary | ICD-10-CM | POA: Diagnosis not present

## 2019-08-12 DIAGNOSIS — E1122 Type 2 diabetes mellitus with diabetic chronic kidney disease: Secondary | ICD-10-CM | POA: Diagnosis not present

## 2019-08-12 DIAGNOSIS — E1151 Type 2 diabetes mellitus with diabetic peripheral angiopathy without gangrene: Secondary | ICD-10-CM | POA: Diagnosis not present

## 2019-08-12 DIAGNOSIS — J449 Chronic obstructive pulmonary disease, unspecified: Secondary | ICD-10-CM | POA: Diagnosis not present

## 2019-08-12 DIAGNOSIS — L97829 Non-pressure chronic ulcer of other part of left lower leg with unspecified severity: Secondary | ICD-10-CM | POA: Diagnosis not present

## 2019-08-12 DIAGNOSIS — N1832 Chronic kidney disease, stage 3b: Secondary | ICD-10-CM | POA: Diagnosis not present

## 2019-08-12 DIAGNOSIS — I1310 Hypertensive heart and chronic kidney disease without heart failure, with stage 1 through stage 4 chronic kidney disease, or unspecified chronic kidney disease: Secondary | ICD-10-CM | POA: Diagnosis not present

## 2019-08-12 DIAGNOSIS — I890 Lymphedema, not elsewhere classified: Secondary | ICD-10-CM | POA: Diagnosis not present

## 2019-08-12 DIAGNOSIS — I2510 Atherosclerotic heart disease of native coronary artery without angina pectoris: Secondary | ICD-10-CM | POA: Diagnosis not present

## 2019-08-14 DIAGNOSIS — I1310 Hypertensive heart and chronic kidney disease without heart failure, with stage 1 through stage 4 chronic kidney disease, or unspecified chronic kidney disease: Secondary | ICD-10-CM

## 2019-08-14 DIAGNOSIS — I890 Lymphedema, not elsewhere classified: Secondary | ICD-10-CM

## 2019-08-15 DIAGNOSIS — K591 Functional diarrhea: Secondary | ICD-10-CM | POA: Diagnosis not present

## 2019-08-16 DIAGNOSIS — N401 Enlarged prostate with lower urinary tract symptoms: Secondary | ICD-10-CM | POA: Diagnosis not present

## 2019-08-16 DIAGNOSIS — R351 Nocturia: Secondary | ICD-10-CM | POA: Diagnosis not present

## 2019-08-16 DIAGNOSIS — R310 Gross hematuria: Secondary | ICD-10-CM | POA: Diagnosis not present

## 2019-08-16 DIAGNOSIS — N3281 Overactive bladder: Secondary | ICD-10-CM | POA: Diagnosis not present

## 2019-08-20 ENCOUNTER — Other Ambulatory Visit: Payer: Self-pay

## 2019-08-20 DIAGNOSIS — E1122 Type 2 diabetes mellitus with diabetic chronic kidney disease: Secondary | ICD-10-CM | POA: Diagnosis not present

## 2019-08-20 DIAGNOSIS — I890 Lymphedema, not elsewhere classified: Secondary | ICD-10-CM | POA: Diagnosis not present

## 2019-08-20 DIAGNOSIS — N1832 Chronic kidney disease, stage 3b: Secondary | ICD-10-CM | POA: Diagnosis not present

## 2019-08-20 DIAGNOSIS — L97829 Non-pressure chronic ulcer of other part of left lower leg with unspecified severity: Secondary | ICD-10-CM | POA: Diagnosis not present

## 2019-08-20 DIAGNOSIS — E1151 Type 2 diabetes mellitus with diabetic peripheral angiopathy without gangrene: Secondary | ICD-10-CM | POA: Diagnosis not present

## 2019-08-20 DIAGNOSIS — I1310 Hypertensive heart and chronic kidney disease without heart failure, with stage 1 through stage 4 chronic kidney disease, or unspecified chronic kidney disease: Secondary | ICD-10-CM | POA: Diagnosis not present

## 2019-08-20 DIAGNOSIS — R310 Gross hematuria: Secondary | ICD-10-CM | POA: Diagnosis not present

## 2019-08-20 DIAGNOSIS — J449 Chronic obstructive pulmonary disease, unspecified: Secondary | ICD-10-CM | POA: Diagnosis not present

## 2019-08-20 DIAGNOSIS — I2510 Atherosclerotic heart disease of native coronary artery without angina pectoris: Secondary | ICD-10-CM | POA: Diagnosis not present

## 2019-08-20 DIAGNOSIS — L97819 Non-pressure chronic ulcer of other part of right lower leg with unspecified severity: Secondary | ICD-10-CM | POA: Diagnosis not present

## 2019-08-20 MED ORDER — HYDRALAZINE HCL 10 MG PO TABS: 10.0000 mg | ORAL_TABLET | Freq: Four times a day (QID) | ORAL | 2 refills | Status: DC

## 2019-08-21 ENCOUNTER — Telehealth: Payer: Self-pay | Admitting: Legal Medicine

## 2019-08-21 NOTE — Progress Notes (Signed)
  Chronic Care Management   Note  08/21/2019 Name: Matthew Gutierrez MRN: [id number] DOB: 08-Nov-1955  Matthew Gutierrez is a 64 y.o. year old male who is a primary care patient of Lillard Anes, MD. I reached out to [employer] by phone today in response to a referral sent by [name] PCP, Lillard Anes, MD.   [name] was given information about Chronic Care Management services today including:  1. CCM service includes personalized support from designated clinical staff supervised by his physician, including individualized plan of care and coordination with other care providers 2. 24/7 contact phone numbers for assistance for urgent and routine care needs. 3. Service will only be billed when office clinical staff spend 20 minutes or more in a month to coordinate care. 4. Only one practitioner may furnish and bill the service in a calendar month. 5. The patient may stop CCM services at any time (effective at the end of the month) by phone call to the office staff.   Patient agreed to services and verbal consent obtained.   Follow up plan:   [name] Scheduler

## 2019-08-22 ENCOUNTER — Other Ambulatory Visit: Payer: Self-pay

## 2019-08-22 DIAGNOSIS — I10 Essential (primary) hypertension: Secondary | ICD-10-CM

## 2019-08-22 MED ORDER — HYDRALAZINE HCL 10 MG PO TABS: 20.0000 mg | ORAL_TABLET | Freq: Two times a day (BID) | ORAL | 2 refills | Status: DC

## 2019-08-23 DIAGNOSIS — L97829 Non-pressure chronic ulcer of other part of left lower leg with unspecified severity: Secondary | ICD-10-CM | POA: Diagnosis not present

## 2019-08-23 DIAGNOSIS — J449 Chronic obstructive pulmonary disease, unspecified: Secondary | ICD-10-CM | POA: Diagnosis not present

## 2019-08-23 DIAGNOSIS — L97819 Non-pressure chronic ulcer of other part of right lower leg with unspecified severity: Secondary | ICD-10-CM | POA: Diagnosis not present

## 2019-08-23 DIAGNOSIS — E1151 Type 2 diabetes mellitus with diabetic peripheral angiopathy without gangrene: Secondary | ICD-10-CM | POA: Diagnosis not present

## 2019-08-23 DIAGNOSIS — I2510 Atherosclerotic heart disease of native coronary artery without angina pectoris: Secondary | ICD-10-CM | POA: Diagnosis not present

## 2019-08-23 DIAGNOSIS — I1310 Hypertensive heart and chronic kidney disease without heart failure, with stage 1 through stage 4 chronic kidney disease, or unspecified chronic kidney disease: Secondary | ICD-10-CM | POA: Diagnosis not present

## 2019-08-23 DIAGNOSIS — N1832 Chronic kidney disease, stage 3b: Secondary | ICD-10-CM | POA: Diagnosis not present

## 2019-08-23 DIAGNOSIS — I890 Lymphedema, not elsewhere classified: Secondary | ICD-10-CM | POA: Diagnosis not present

## 2019-08-23 DIAGNOSIS — E1122 Type 2 diabetes mellitus with diabetic chronic kidney disease: Secondary | ICD-10-CM | POA: Diagnosis not present

## 2019-08-23 NOTE — Chronic Care Management (AMB) (Signed)
Chronic Care Management Pharmacy  Name: Matthew Gutierrez  MRN: [id number] DOB: Aug 12, 1955  Chief Complaint/ HPI    Matthew Gutierrez,  64 y.o. , male presents for their Initial CCM visit with the clinical pharmacist via telephone due to COVID-19 Pandemic.  PCP : Lillard Anes, MD  Their chronic conditions include: HTN, COPD, Sleep Apnea, Atherosclerotic heart disease, DM, CKD, Hx of DVT/Stroke and bowel/urinary Incontinence.   Office Visits: 07/25/2019 - Dr. Henrene Pastor incontinence of bladder and stool. Stop all bowel laxatives and magnesium. Referral to GI and Urology.   06/07/2019 - Patient seen for chronic disease visit. Has foot ulcer. Labs checked. CBC revealed chronic low RBC/HGB, CMP revealed GFR 43 and elevated Glucose/SCr,BUN. A1C - 8.4%. Lipid panel - TC 239, TG 418, LDL 124 HDL 41.   Consult Visit: 08/15/2019 - Dr. Lyda Jester recommended taking benefiber daily and miralax prn if no bowel movement by supper. Patient to record all BM and Miralax to report at follow-up visit in 6 weeks.   07/19/2019 - results for diabetic eye exam with no retinopathy.   Medications: Outpatient Encounter Medications as of 08/27/2019  Medication Sig  . atenolol-chlorthalidone (TENORETIC) 100-25 MG per tablet Take 1 tablet by mouth daily.  . fesoterodine (TOVIAZ) 4 MG TB24 tablet Take 4 mg by mouth daily.  . furosemide (LASIX) 20 MG tablet Take 20 mg by mouth daily.  Marland Kitchen gabapentin (NEURONTIN) 300 MG capsule Take 300 mg by mouth at bedtime.  . hydrALAZINE (APRESOLINE) 10 MG tablet Take 2 tablets (20 mg total) by mouth 2 (two) times daily with a meal.  . lisinopril (ZESTRIL) 10 MG tablet Take 10 mg by mouth daily.  . metFORMIN (GLUCOPHAGE) 1000 MG tablet Take 1,000 mg by mouth 2 (two) times daily with a meal.  . oxyCODONE-acetaminophen (PERCOCET) 10-325 MG tablet Take 1 tablet by mouth every 6 (six) hours as needed for pain.  . phenytoin (DILANTIN) 100 MG ER capsule Take 100 mg by mouth. Take  three capsules twice daily  . pravastatin (PRAVACHOL) 40 MG tablet Take 40 mg by mouth daily.  Marland Kitchen triamcinolone cream (KENALOG) 0.1 %   . warfarin (COUMADIN) 4 MG tablet Take 1 tablet (4 mg total) by mouth daily.  . Wheat Dextrin (BENEFIBER) POWD Take by mouth. Mix 1 tablespoonful in a drink once daily.  . Omega-3 Fatty Acids (RA FISH OIL) 1000 MG CPDR Take by mouth.  . warfarin (COUMADIN) 3 MG tablet Take 1 tablet (3 mg total) by mouth daily. (Patient not taking: Reported on 08/27/2019)  . [DISCONTINUED] hydrALAZINE (APRESOLINE) 10 MG tablet Take 2 tablets (20 mg total) by mouth 2 (two) times daily with a meal. Take two tablets twice daily   No facility-administered encounter medications on file as of 08/27/2019.     Current Diagnosis/Assessment:  Goals Addressed            This Visit's Progress   . Pharmacy Care Plan       CARE PLAN ENTRY  Current Barriers:  . Chronic Disease Management support, education, and care coordination needs related to HTN, HLD, and DMII  Pharmacist Clinical Goal(s):  . Ensure safety, efficacy, and affordability of medications . Goal Hemoglobin A1C <7%.  .  Goal Cholesterol: TC <200, LDL <100, TG <150 HDL >50 mg/dL . Recommend maintaining BP <140/90 mmHg     Interventions: . Comprehensive medication review performed. . Recommend engaging in moderate intensity exercise for 150 minutes each day. . Recommend adhering to a low-fat and high fiber  diet. week. Recommend reducing sodium intake to less than 1500 mg/day. . Recommend visiting your eye doctor for a dilated eye exam annually.  . Recommend receiving annual flu shot each Fall. . Recommend checking feet daily and having a foot exam annually. . Recommend COVID 19 vaccine.   Patient Self Care Activities:  . Patient verbalizes understanding of plan to take metformin with food . Patient is going to work on reducing sodium intake.  . Patient is going to take Pravastatin with evening medications.   . Patient is going to get COVID vaccine through the hospital.   Initial goal documentation        Diabetes   Recent Relevant Labs: Lab Results  Component Value Date/Time   HGBA1C 8.4 06/07/2019 12:00 AM   HGBA1C 8.0 11/12/2018 12:00 AM   MICROALBUR 150 07/20/2017 12:00 AM     Checking BG: Weekly - patient says really painful to check so doing a few times weekly.   Recent FBG Readings: 140-150 mg/dL Patient has failed these medicatioin past: glimepiride Patient is currently uncontrolled on the following medications: Metformin 1000 mg   Last diabetic Foot exam: Dr. Henrene Pastor checks in office.  Last eye exam - Due now and has a card to make an appointment.   We discussed: how to recognize and treat signs of hypoglycemia. Home health nurse is also checking feet. Diarrhea issue could be improved from eating with Metformin.   Plan Try taking metformin with food to avoid diarrhea.  Continue current medications,  Hypertension   Office blood pressures are  BP Readings from Last 3 Encounters:  07/25/19 140/90  07/23/19 (!) 178/80  06/07/19 (!) 180/70    Patient has failed these meds in the past: none that he can remember at this time.  Patient is currently uncontrolled on the following medications: atenolol-chlorthalidone 100-25 mg, furosemide 20 mg, hydralazine 10 mg, lisinopril 10 mg, hydralazine 10 mg  Patient checks BP at home several times per month  Patient home BP readings are ranging: 140/90   We discussed diet and exercise extensively. Patient admits he probably doesn't eat right. Eats bacon, sausage, eggs regularly. Admits he needs to be "off of sodium" due to legs swelling.   Plan  Continue current medications. Will try to cut back on sodium intake.   Hyperlipidemia/Hx of Stroke  Hx. Of stroke. Has had some stent.   Lipid Panel  Lipid panel - TC 239, TG 418, LDL 124 HDL 41.   Patient has failed these meds in past: omega 3 fatty acids 1000 mg Patient is  currently uncontrolled on the following medications: pravastatin  We discussed:  diet and exercise extensively. We also discussed moving pravastatin to evening medications.   Plan  Continue current medications    And Diabetic Neuropathy   Patient has failed these meds in past: none that he can remember Patient is currently controlled on the following medications: gabapentin 300 mg capsule  We discussed:  Continuing working on blood sugar management to keep neuropathy from worsening.    Plan  Continue current medications   Bowel/Urinary Incontinence:    Patient has failed these meds in past: laxatives, magnesium Patient is currently controlled on the following medications: Benefiber daily, Miralax PRN - haven't used yet, Toviaz  We discussed:  trying metofrmin with food to improve diarrhea. Discussing with urology about generic alternative for Lisbeth Ply if continued long-term. Lisbeth Ply is new to patient and MD provided samples for now.   Plan  Continue current medications  Absence Epileptic Syndrome   Patient has failed these meds in past: n/a Patient is currently controlled on the following medications: phenytoin  We discussed:  Compliance with phenytoin to continue good control.   Plan  Continue current medications   Hx.Stroke/DVT/MI - Chronic anticoag   Patient has failed these meds in past: aspirin 81 mg, plavix 75 mg Patient is currently controlled on the following medications: warfarin  We discussed: Being careful with shaving and cuts. No issues with abnormal bleeding.   Plan  Continue current medications   Pain/Arthritis   Patient has failed these meds in past: n/a Patient is currently controlled on the following medications: oxycodone-apap 10-325 mg  We discussed:  Patient does not take daily. Takes only as needed for severe sholder pain.   Plan  Continue current medications    Health Maintenance   Patient is currently controlled on the  following medications:   Triamcinolone cream- legs   We discussed:  Working to reduce sodium intake and improve leg swelling/weeping.   Plan  Continue current medications  Medication Management   Pt uses [facility] for all medications Using weekly pill box. Discussed packaging option but patient does not feel needed at this time.  Pt endorses good compliance  We discussed:  Continuing to use medication organizer to keep with good compliance. Patient is very cautious taking phenytoin as directed to avoid a seizure.   Plan  Continue current medication regimen.     Follow up: 3 months

## 2019-08-26 ENCOUNTER — Other Ambulatory Visit: Payer: Self-pay

## 2019-08-26 DIAGNOSIS — N1832 Chronic kidney disease, stage 3b: Secondary | ICD-10-CM | POA: Diagnosis not present

## 2019-08-26 DIAGNOSIS — L97829 Non-pressure chronic ulcer of other part of left lower leg with unspecified severity: Secondary | ICD-10-CM | POA: Diagnosis not present

## 2019-08-26 DIAGNOSIS — I10 Essential (primary) hypertension: Secondary | ICD-10-CM

## 2019-08-26 DIAGNOSIS — E1151 Type 2 diabetes mellitus with diabetic peripheral angiopathy without gangrene: Secondary | ICD-10-CM | POA: Diagnosis not present

## 2019-08-26 DIAGNOSIS — E1122 Type 2 diabetes mellitus with diabetic chronic kidney disease: Secondary | ICD-10-CM | POA: Diagnosis not present

## 2019-08-26 DIAGNOSIS — J449 Chronic obstructive pulmonary disease, unspecified: Secondary | ICD-10-CM | POA: Diagnosis not present

## 2019-08-26 DIAGNOSIS — I890 Lymphedema, not elsewhere classified: Secondary | ICD-10-CM | POA: Diagnosis not present

## 2019-08-26 DIAGNOSIS — L97819 Non-pressure chronic ulcer of other part of right lower leg with unspecified severity: Secondary | ICD-10-CM | POA: Diagnosis not present

## 2019-08-26 DIAGNOSIS — I2510 Atherosclerotic heart disease of native coronary artery without angina pectoris: Secondary | ICD-10-CM | POA: Diagnosis not present

## 2019-08-26 DIAGNOSIS — I1310 Hypertensive heart and chronic kidney disease without heart failure, with stage 1 through stage 4 chronic kidney disease, or unspecified chronic kidney disease: Secondary | ICD-10-CM | POA: Diagnosis not present

## 2019-08-26 MED ORDER — HYDRALAZINE HCL 10 MG PO TABS: 20.0000 mg | ORAL_TABLET | Freq: Two times a day (BID) | ORAL | 2 refills | Status: DC

## 2019-08-27 ENCOUNTER — Ambulatory Visit: Payer: Medicare HMO

## 2019-08-27 ENCOUNTER — Other Ambulatory Visit: Payer: Self-pay

## 2019-08-27 DIAGNOSIS — E782 Mixed hyperlipidemia: Secondary | ICD-10-CM

## 2019-08-27 DIAGNOSIS — E1149 Type 2 diabetes mellitus with other diabetic neurological complication: Secondary | ICD-10-CM

## 2019-08-27 DIAGNOSIS — I25118 Atherosclerotic heart disease of native coronary artery with other forms of angina pectoris: Secondary | ICD-10-CM

## 2019-08-27 NOTE — Patient Instructions (Addendum)
Visit Information  Thank you for your time discussing your medications. I look forward to working with you to achieve your health care goals. Below is a summary of what we talked about during our visit.   Goals Addressed            This Visit's Progress   . Pharmacy Care Plan       CARE PLAN ENTRY  Current Barriers:  . Chronic Disease Management support, education, and care coordination needs related to HTN, HLD, and DMII  Pharmacist Clinical Goal(s):  . Ensure safety, efficacy, and affordability of medications . Goal Hemoglobin A1C <7%.  .  Goal Cholesterol: TC <200, LDL <100, TG <150 HDL >50 mg/dL . Recommend maintaining BP <140/90 mmHg     Interventions: . Comprehensive medication review performed. . Recommend engaging in moderate intensity exercise for 150 minutes each day. . Recommend adhering to a low-fat and high fiber diet. week. Recommend reducing sodium intake to less than 1500 mg/day. . Recommend visiting your eye doctor for a dilated eye exam annually.  . Recommend receiving annual flu shot each Fall. . Recommend checking feet daily and having a foot exam annually. . Recommend COVID 19 vaccine.   Patient Self Care Activities:  . Patient verbalizes understanding of plan to take metformin with food . Patient is going to work on reducing sodium intake.  . Patient is going to take Pravastatin with evening medications.  . Patient is going to get COVID vaccine through the hospital.   Initial goal documentation        Mr. Ficca was given information about Chronic Care Management services today including:  1. CCM service includes personalized support from designated clinical staff supervised by his physician, including individualized plan of care and coordination with other care providers 2. 24/7 contact phone numbers for assistance for urgent and routine care needs. 3. Standard insurance, coinsurance, copays and deductibles apply for chronic care management only  during months in which we provide at least 20 minutes of these services. Most insurances cover these services at 100%, however patients may be responsible for any copay, coinsurance and/or deductible if applicable. This service may help you avoid the need for more expensive face-to-face services. 4. Only one practitioner may furnish and bill the service in a calendar month. 5. The patient may stop CCM services at any time (effective at the end of the month) by phone call to the office staff.  Patient agreed to services and verbal consent obtained.   Print copy of patient instructions provided.  Telephone follow up appointment with pharmacy team member scheduled for: 11/28/2019  Sherre Poot, PharmD Clinical Pharmacist [facility] [phone]  DASH Eating Plan DASH stands for "Dietary Approaches to Stop Hypertension." The DASH eating plan is a healthy eating plan that has been shown to reduce high blood pressure (hypertension). It may also reduce your risk for type 2 diabetes, heart disease, and stroke. The DASH eating plan may also help with weight loss. What are tips for following this plan?  General guidelines  Avoid eating more than 2,300 mg (milligrams) of salt (sodium) a day. If you have hypertension, you may need to reduce your sodium intake to 1,500 mg a day.  Limit alcohol intake to no more than 1 drink a day for nonpregnant women and 2 drinks a day for men. One drink equals 12 oz of beer, 5 oz of wine, or 1 oz of hard liquor.  Work with your health care provider to  maintain a healthy body weight or to lose weight. Ask what an ideal weight is for you.  Get at least 30 minutes of exercise that causes your heart to beat faster (aerobic exercise) most days of the week. Activities may include walking, swimming, or biking.  Work with your health care provider or diet and nutrition specialist (dietitian) to adjust your eating plan to your individual calorie needs. Reading  food labels   Check food labels for the amount of sodium per serving. Choose foods with less than 5 percent of the Daily Value of sodium. Generally, foods with less than 300 mg of sodium per serving fit into this eating plan.  To find whole grains, look for the word "whole" as the first word in the ingredient list. Shopping  Buy products labeled as "low-sodium" or "no salt added."  Buy fresh foods. Avoid canned foods and premade or frozen meals. Cooking  Avoid adding salt when cooking. Use salt-free seasonings or herbs instead of table salt or sea salt. Check with your health care provider or pharmacist before using salt substitutes.  Do not fry foods. Cook foods using healthy methods such as baking, boiling, grilling, and broiling instead.  Cook with heart-healthy oils, such as olive, canola, soybean, or sunflower oil. Meal planning  Eat a balanced diet that includes: ? 5 or more servings of fruits and vegetables each day. At each meal, try to fill half of your plate with fruits and vegetables. ? Up to 6-8 servings of whole grains each day. ? Less than 6 oz of lean meat, poultry, or fish each day. A 3-oz serving of meat is about the same size as a deck of cards. One egg equals 1 oz. ? 2 servings of low-fat dairy each day. ? A serving of nuts, seeds, or beans 5 times each week. ? Heart-healthy fats. Healthy fats called Omega-3 fatty acids are found in foods such as flaxseeds and coldwater fish, like sardines, salmon, and mackerel.  Limit how much you eat of the following: ? Canned or prepackaged foods. ? Food that is high in trans fat, such as fried foods. ? Food that is high in saturated fat, such as fatty meat. ? Sweets, desserts, sugary drinks, and other foods with added sugar. ? Full-fat dairy products.  Do not salt foods before eating.  Try to eat at least 2 vegetarian meals each week.  Eat more home-cooked food and less restaurant, buffet, and fast food.  When eating at  a restaurant, ask that your food be prepared with less salt or no salt, if possible. What foods are recommended? The items listed may not be a complete list. Talk with your dietitian about what dietary choices are best for you. Grains Whole-grain or whole-wheat bread. Whole-grain or whole-wheat pasta. Shailene Demonbreun rice. Modena Morrow. Bulgur. Whole-grain and low-sodium cereals. Pita bread. Low-fat, low-sodium crackers. Whole-wheat flour tortillas. Vegetables Fresh or frozen vegetables (raw, steamed, roasted, or grilled). Low-sodium or reduced-sodium tomato and vegetable juice. Low-sodium or reduced-sodium tomato sauce and tomato paste. Low-sodium or reduced-sodium canned vegetables. Fruits All fresh, dried, or frozen fruit. Canned fruit in natural juice (without added sugar). Meat and other protein foods Skinless chicken or [place]. Ground chicken or [place]. Pork with fat trimmed off. Fish and seafood. Egg whites. Dried beans, peas, or lentils. Unsalted nuts, nut butters, and seeds. Unsalted canned beans. Lean cuts of beef with fat trimmed off. Low-sodium, lean deli meat. Dairy Low-fat (1%) or fat-free (skim) milk. Fat-free, low-fat, or reduced-fat cheeses. Nonfat,  low-sodium ricotta or cottage cheese. Low-fat or nonfat yogurt. Low-fat, low-sodium cheese. Fats and oils Soft margarine without trans fats. Vegetable oil. Low-fat, reduced-fat, or light mayonnaise and salad dressings (reduced-sodium). Canola, safflower, olive, soybean, and sunflower oils. Avocado. Seasoning and other foods Herbs. Spices. Seasoning mixes without salt. Unsalted popcorn and pretzels. Fat-free sweets. What foods are not recommended? The items listed may not be a complete list. Talk with your dietitian about what dietary choices are best for you. Grains Baked goods made with fat, such as croissants, muffins, or some breads. Dry pasta or rice meal packs. Vegetables Creamed or fried vegetables. Vegetables in a cheese sauce.  Regular canned vegetables (not low-sodium or reduced-sodium). Regular canned tomato sauce and paste (not low-sodium or reduced-sodium). Regular tomato and vegetable juice (not low-sodium or reduced-sodium). Angie Fava. Olives. Fruits Canned fruit in a light or heavy syrup. Fried fruit. Fruit in cream or butter sauce. Meat and other protein foods Fatty cuts of meat. Ribs. Fried meat. Berniece Salines. Sausage. Bologna and other processed lunch meats. Salami. Fatback. Hotdogs. Bratwurst. Salted nuts and seeds. Canned beans with added salt. Canned or smoked fish. Whole eggs or egg yolks. Chicken or [place] with skin. Dairy Whole or 2% milk, cream, and half-and-half. Whole or full-fat cream cheese. Whole-fat or sweetened yogurt. Full-fat cheese. Nondairy creamers. Whipped toppings. Processed cheese and cheese spreads. Fats and oils Butter. Stick margarine. Lard. Shortening. Ghee. Bacon fat. Tropical oils, such as coconut, palm kernel, or palm oil. Seasoning and other foods Salted popcorn and pretzels. Onion salt, garlic salt, seasoned salt, table salt, and sea salt. Worcestershire sauce. Tartar sauce. Barbecue sauce. Teriyaki sauce. Soy sauce, including reduced-sodium. Steak sauce. Canned and packaged gravies. Fish sauce. Oyster sauce. Cocktail sauce. Horseradish that you find on the shelf. Ketchup. Mustard. Meat flavorings and tenderizers. Bouillon cubes. Hot sauce and Tabasco sauce. Premade or packaged marinades. Premade or packaged taco seasonings. Relishes. Regular salad dressings. Where to find more information:  National Heart, Lung, and [place]: https://wilson-eaton.com/  American Heart Association: www.heart.org Summary  The DASH eating plan is a healthy eating plan that has been shown to reduce high blood pressure (hypertension). It may also reduce your risk for type 2 diabetes, heart disease, and stroke.  With the DASH eating plan, you should limit salt (sodium) intake to 2,300 mg a day. If you have  hypertension, you may need to reduce your sodium intake to 1,500 mg a day.  When on the DASH eating plan, aim to eat more fresh fruits and vegetables, whole grains, lean proteins, low-fat dairy, and heart-healthy fats.  Work with your health care provider or diet and nutrition specialist (dietitian) to adjust your eating plan to your individual calorie needs. This information is not intended to replace advice given to you by your health care provider. Make sure you discuss any questions you have with your health care provider. Document Revised: 04/28/2017 Document Reviewed: 05/09/2016 Elsevier Patient Education  [place] DASH stands for "Dietary Approaches to Stop Hypertension." The DASH eating plan is a healthy eating plan that has been shown to reduce high blood pressure (hypertension). It may also reduce your risk for type 2 diabetes, heart disease, and stroke. The DASH eating plan may also help with weight loss. What are tips for following this plan?  General guidelines  Avoid eating more than 2,300 mg (milligrams) of salt (sodium) a day. If you have hypertension, you may need to reduce your sodium intake to 1,500 mg a day.  Limit alcohol intake to no more than 1 drink a day for nonpregnant women and 2 drinks a day for men. One drink equals 12 oz of beer, 5 oz of wine, or 1 oz of hard liquor.  Work with your health care provider to maintain a healthy body weight or to lose weight. Ask what an ideal weight is for you.  Get at least 30 minutes of exercise that causes your heart to beat faster (aerobic exercise) most days of the week. Activities may include walking, swimming, or biking.  Work with your health care provider or diet and nutrition specialist (dietitian) to adjust your eating plan to your individual calorie needs. Reading food labels   Check food labels for the amount of sodium per serving. Choose foods with less than 5 percent of the Daily Value  of sodium. Generally, foods with less than 300 mg of sodium per serving fit into this eating plan.  To find whole grains, look for the word "whole" as the first word in the ingredient list. Shopping  Buy products labeled as "low-sodium" or "no salt added."  Buy fresh foods. Avoid canned foods and premade or frozen meals. Cooking  Avoid adding salt when cooking. Use salt-free seasonings or herbs instead of table salt or sea salt. Check with your health care provider or pharmacist before using salt substitutes.  Do not fry foods. Cook foods using healthy methods such as baking, boiling, grilling, and broiling instead.  Cook with heart-healthy oils, such as olive, canola, soybean, or sunflower oil. Meal planning  Eat a balanced diet that includes: ? 5 or more servings of fruits and vegetables each day. At each meal, try to fill half of your plate with fruits and vegetables. ? Up to 6-8 servings of whole grains each day. ? Less than 6 oz of lean meat, poultry, or fish each day. A 3-oz serving of meat is about the same size as a deck of cards. One egg equals 1 oz. ? 2 servings of low-fat dairy each day. ? A serving of nuts, seeds, or beans 5 times each week. ? Heart-healthy fats. Healthy fats called Omega-3 fatty acids are found in foods such as flaxseeds and coldwater fish, like sardines, salmon, and mackerel.  Limit how much you eat of the following: ? Canned or prepackaged foods. ? Food that is high in trans fat, such as fried foods. ? Food that is high in saturated fat, such as fatty meat. ? Sweets, desserts, sugary drinks, and other foods with added sugar. ? Full-fat dairy products.  Do not salt foods before eating.  Try to eat at least 2 vegetarian meals each week.  Eat more home-cooked food and less restaurant, buffet, and fast food.  When eating at a restaurant, ask that your food be prepared with less salt or no salt, if possible. What foods are recommended? The items  listed may not be a complete list. Talk with your dietitian about what dietary choices are best for you. Grains Whole-grain or whole-wheat bread. Whole-grain or whole-wheat pasta. Andrae Claunch rice. Modena Morrow. Bulgur. Whole-grain and low-sodium cereals. Pita bread. Low-fat, low-sodium crackers. Whole-wheat flour tortillas. Vegetables Fresh or frozen vegetables (raw, steamed, roasted, or grilled). Low-sodium or reduced-sodium tomato and vegetable juice. Low-sodium or reduced-sodium tomato sauce and tomato paste. Low-sodium or reduced-sodium canned vegetables. Fruits All fresh, dried, or frozen fruit. Canned fruit in natural juice (without added sugar). Meat and other protein foods Skinless chicken or [place]. Ground chicken or [place]. Pork with fat  trimmed off. Fish and seafood. Egg whites. Dried beans, peas, or lentils. Unsalted nuts, nut butters, and seeds. Unsalted canned beans. Lean cuts of beef with fat trimmed off. Low-sodium, lean deli meat. Dairy Low-fat (1%) or fat-free (skim) milk. Fat-free, low-fat, or reduced-fat cheeses. Nonfat, low-sodium ricotta or cottage cheese. Low-fat or nonfat yogurt. Low-fat, low-sodium cheese. Fats and oils Soft margarine without trans fats. Vegetable oil. Low-fat, reduced-fat, or light mayonnaise and salad dressings (reduced-sodium). Canola, safflower, olive, soybean, and sunflower oils. Avocado. Seasoning and other foods Herbs. Spices. Seasoning mixes without salt. Unsalted popcorn and pretzels. Fat-free sweets. What foods are not recommended? The items listed may not be a complete list. Talk with your dietitian about what dietary choices are best for you. Grains Baked goods made with fat, such as croissants, muffins, or some breads. Dry pasta or rice meal packs. Vegetables Creamed or fried vegetables. Vegetables in a cheese sauce. Regular canned vegetables (not low-sodium or reduced-sodium). Regular canned tomato sauce and paste (not low-sodium or  reduced-sodium). Regular tomato and vegetable juice (not low-sodium or reduced-sodium). Angie Fava. Olives. Fruits Canned fruit in a light or heavy syrup. Fried fruit. Fruit in cream or butter sauce. Meat and other protein foods Fatty cuts of meat. Ribs. Fried meat. Berniece Salines. Sausage. Bologna and other processed lunch meats. Salami. Fatback. Hotdogs. Bratwurst. Salted nuts and seeds. Canned beans with added salt. Canned or smoked fish. Whole eggs or egg yolks. Chicken or [place] with skin. Dairy Whole or 2% milk, cream, and half-and-half. Whole or full-fat cream cheese. Whole-fat or sweetened yogurt. Full-fat cheese. Nondairy creamers. Whipped toppings. Processed cheese and cheese spreads. Fats and oils Butter. Stick margarine. Lard. Shortening. Ghee. Bacon fat. Tropical oils, such as coconut, palm kernel, or palm oil. Seasoning and other foods Salted popcorn and pretzels. Onion salt, garlic salt, seasoned salt, table salt, and sea salt. Worcestershire sauce. Tartar sauce. Barbecue sauce. Teriyaki sauce. Soy sauce, including reduced-sodium. Steak sauce. Canned and packaged gravies. Fish sauce. Oyster sauce. Cocktail sauce. Horseradish that you find on the shelf. Ketchup. Mustard. Meat flavorings and tenderizers. Bouillon cubes. Hot sauce and Tabasco sauce. Premade or packaged marinades. Premade or packaged taco seasonings. Relishes. Regular salad dressings. Where to find more information:  National Heart, Lung, and [place]: https://wilson-eaton.com/  American Heart Association: www.heart.org Summary  The DASH eating plan is a healthy eating plan that has been shown to reduce high blood pressure (hypertension). It may also reduce your risk for type 2 diabetes, heart disease, and stroke.  With the DASH eating plan, you should limit salt (sodium) intake to 2,300 mg a day. If you have hypertension, you may need to reduce your sodium intake to 1,500 mg a day.  When on the DASH eating plan, aim to eat more  fresh fruits and vegetables, whole grains, lean proteins, low-fat dairy, and heart-healthy fats.  Work with your health care provider or diet and nutrition specialist (dietitian) to adjust your eating plan to your individual calorie needs. This information is not intended to replace advice given to you by your health care provider. Make sure you discuss any questions you have with your health care provider. Document Revised: 04/28/2017 Document Reviewed: 05/09/2016 Elsevier Patient Education  2020 [employer].

## 2019-08-29 DIAGNOSIS — J449 Chronic obstructive pulmonary disease, unspecified: Secondary | ICD-10-CM | POA: Diagnosis not present

## 2019-08-29 DIAGNOSIS — I1310 Hypertensive heart and chronic kidney disease without heart failure, with stage 1 through stage 4 chronic kidney disease, or unspecified chronic kidney disease: Secondary | ICD-10-CM | POA: Diagnosis not present

## 2019-08-29 DIAGNOSIS — N1832 Chronic kidney disease, stage 3b: Secondary | ICD-10-CM | POA: Diagnosis not present

## 2019-08-29 DIAGNOSIS — I890 Lymphedema, not elsewhere classified: Secondary | ICD-10-CM | POA: Diagnosis not present

## 2019-08-29 DIAGNOSIS — L97819 Non-pressure chronic ulcer of other part of right lower leg with unspecified severity: Secondary | ICD-10-CM | POA: Diagnosis not present

## 2019-08-29 DIAGNOSIS — L97829 Non-pressure chronic ulcer of other part of left lower leg with unspecified severity: Secondary | ICD-10-CM | POA: Diagnosis not present

## 2019-08-29 DIAGNOSIS — I2510 Atherosclerotic heart disease of native coronary artery without angina pectoris: Secondary | ICD-10-CM | POA: Diagnosis not present

## 2019-08-29 DIAGNOSIS — E1151 Type 2 diabetes mellitus with diabetic peripheral angiopathy without gangrene: Secondary | ICD-10-CM | POA: Diagnosis not present

## 2019-08-29 DIAGNOSIS — E1122 Type 2 diabetes mellitus with diabetic chronic kidney disease: Secondary | ICD-10-CM | POA: Diagnosis not present

## 2019-09-01 DIAGNOSIS — N1832 Chronic kidney disease, stage 3b: Secondary | ICD-10-CM | POA: Diagnosis not present

## 2019-09-01 DIAGNOSIS — L97829 Non-pressure chronic ulcer of other part of left lower leg with unspecified severity: Secondary | ICD-10-CM | POA: Diagnosis not present

## 2019-09-01 DIAGNOSIS — I890 Lymphedema, not elsewhere classified: Secondary | ICD-10-CM | POA: Diagnosis not present

## 2019-09-01 DIAGNOSIS — I1310 Hypertensive heart and chronic kidney disease without heart failure, with stage 1 through stage 4 chronic kidney disease, or unspecified chronic kidney disease: Secondary | ICD-10-CM | POA: Diagnosis not present

## 2019-09-01 DIAGNOSIS — J449 Chronic obstructive pulmonary disease, unspecified: Secondary | ICD-10-CM | POA: Diagnosis not present

## 2019-09-01 DIAGNOSIS — E1122 Type 2 diabetes mellitus with diabetic chronic kidney disease: Secondary | ICD-10-CM | POA: Diagnosis not present

## 2019-09-01 DIAGNOSIS — I2510 Atherosclerotic heart disease of native coronary artery without angina pectoris: Secondary | ICD-10-CM | POA: Diagnosis not present

## 2019-09-01 DIAGNOSIS — E1151 Type 2 diabetes mellitus with diabetic peripheral angiopathy without gangrene: Secondary | ICD-10-CM | POA: Diagnosis not present

## 2019-09-01 DIAGNOSIS — L97819 Non-pressure chronic ulcer of other part of right lower leg with unspecified severity: Secondary | ICD-10-CM | POA: Diagnosis not present

## 2019-09-02 DIAGNOSIS — I2510 Atherosclerotic heart disease of native coronary artery without angina pectoris: Secondary | ICD-10-CM | POA: Diagnosis not present

## 2019-09-02 DIAGNOSIS — L97819 Non-pressure chronic ulcer of other part of right lower leg with unspecified severity: Secondary | ICD-10-CM | POA: Diagnosis not present

## 2019-09-02 DIAGNOSIS — I1310 Hypertensive heart and chronic kidney disease without heart failure, with stage 1 through stage 4 chronic kidney disease, or unspecified chronic kidney disease: Secondary | ICD-10-CM | POA: Diagnosis not present

## 2019-09-02 DIAGNOSIS — J449 Chronic obstructive pulmonary disease, unspecified: Secondary | ICD-10-CM | POA: Diagnosis not present

## 2019-09-02 DIAGNOSIS — L97829 Non-pressure chronic ulcer of other part of left lower leg with unspecified severity: Secondary | ICD-10-CM | POA: Diagnosis not present

## 2019-09-02 DIAGNOSIS — E1122 Type 2 diabetes mellitus with diabetic chronic kidney disease: Secondary | ICD-10-CM | POA: Diagnosis not present

## 2019-09-02 DIAGNOSIS — I890 Lymphedema, not elsewhere classified: Secondary | ICD-10-CM | POA: Diagnosis not present

## 2019-09-02 DIAGNOSIS — N1832 Chronic kidney disease, stage 3b: Secondary | ICD-10-CM | POA: Diagnosis not present

## 2019-09-02 DIAGNOSIS — E1151 Type 2 diabetes mellitus with diabetic peripheral angiopathy without gangrene: Secondary | ICD-10-CM | POA: Diagnosis not present

## 2019-09-03 ENCOUNTER — Other Ambulatory Visit: Payer: Self-pay

## 2019-09-03 DIAGNOSIS — Z7901 Long term (current) use of anticoagulants: Secondary | ICD-10-CM

## 2019-09-03 DIAGNOSIS — I10 Essential (primary) hypertension: Secondary | ICD-10-CM

## 2019-09-03 DIAGNOSIS — E782 Mixed hyperlipidemia: Secondary | ICD-10-CM

## 2019-09-03 NOTE — Progress Notes (Signed)
CCM appointment was on 08/27/2019 with Sherre Poot, PharmD.

## 2019-09-06 DIAGNOSIS — I1310 Hypertensive heart and chronic kidney disease without heart failure, with stage 1 through stage 4 chronic kidney disease, or unspecified chronic kidney disease: Secondary | ICD-10-CM | POA: Diagnosis not present

## 2019-09-06 DIAGNOSIS — I890 Lymphedema, not elsewhere classified: Secondary | ICD-10-CM | POA: Diagnosis not present

## 2019-09-06 DIAGNOSIS — L97829 Non-pressure chronic ulcer of other part of left lower leg with unspecified severity: Secondary | ICD-10-CM | POA: Diagnosis not present

## 2019-09-06 DIAGNOSIS — L97819 Non-pressure chronic ulcer of other part of right lower leg with unspecified severity: Secondary | ICD-10-CM | POA: Diagnosis not present

## 2019-09-06 DIAGNOSIS — E1122 Type 2 diabetes mellitus with diabetic chronic kidney disease: Secondary | ICD-10-CM | POA: Diagnosis not present

## 2019-09-06 DIAGNOSIS — E1151 Type 2 diabetes mellitus with diabetic peripheral angiopathy without gangrene: Secondary | ICD-10-CM | POA: Diagnosis not present

## 2019-09-06 DIAGNOSIS — N1832 Chronic kidney disease, stage 3b: Secondary | ICD-10-CM | POA: Diagnosis not present

## 2019-09-06 DIAGNOSIS — J449 Chronic obstructive pulmonary disease, unspecified: Secondary | ICD-10-CM | POA: Diagnosis not present

## 2019-09-06 DIAGNOSIS — I2510 Atherosclerotic heart disease of native coronary artery without angina pectoris: Secondary | ICD-10-CM | POA: Diagnosis not present

## 2019-09-09 DIAGNOSIS — I890 Lymphedema, not elsewhere classified: Secondary | ICD-10-CM | POA: Diagnosis not present

## 2019-09-09 DIAGNOSIS — N1832 Chronic kidney disease, stage 3b: Secondary | ICD-10-CM | POA: Diagnosis not present

## 2019-09-09 DIAGNOSIS — L97819 Non-pressure chronic ulcer of other part of right lower leg with unspecified severity: Secondary | ICD-10-CM | POA: Diagnosis not present

## 2019-09-09 DIAGNOSIS — E1122 Type 2 diabetes mellitus with diabetic chronic kidney disease: Secondary | ICD-10-CM | POA: Diagnosis not present

## 2019-09-09 DIAGNOSIS — L97829 Non-pressure chronic ulcer of other part of left lower leg with unspecified severity: Secondary | ICD-10-CM | POA: Diagnosis not present

## 2019-09-09 DIAGNOSIS — J449 Chronic obstructive pulmonary disease, unspecified: Secondary | ICD-10-CM | POA: Diagnosis not present

## 2019-09-09 DIAGNOSIS — E1151 Type 2 diabetes mellitus with diabetic peripheral angiopathy without gangrene: Secondary | ICD-10-CM | POA: Diagnosis not present

## 2019-09-09 DIAGNOSIS — I2510 Atherosclerotic heart disease of native coronary artery without angina pectoris: Secondary | ICD-10-CM | POA: Diagnosis not present

## 2019-09-09 DIAGNOSIS — I1310 Hypertensive heart and chronic kidney disease without heart failure, with stage 1 through stage 4 chronic kidney disease, or unspecified chronic kidney disease: Secondary | ICD-10-CM | POA: Diagnosis not present

## 2019-09-11 DIAGNOSIS — R310 Gross hematuria: Secondary | ICD-10-CM | POA: Diagnosis not present

## 2019-09-11 DIAGNOSIS — J449 Chronic obstructive pulmonary disease, unspecified: Secondary | ICD-10-CM | POA: Diagnosis not present

## 2019-09-11 DIAGNOSIS — R351 Nocturia: Secondary | ICD-10-CM | POA: Diagnosis not present

## 2019-09-11 DIAGNOSIS — N401 Enlarged prostate with lower urinary tract symptoms: Secondary | ICD-10-CM | POA: Diagnosis not present

## 2019-09-11 DIAGNOSIS — J9601 Acute respiratory failure with hypoxia: Secondary | ICD-10-CM | POA: Diagnosis not present

## 2019-09-13 DIAGNOSIS — L97829 Non-pressure chronic ulcer of other part of left lower leg with unspecified severity: Secondary | ICD-10-CM | POA: Diagnosis not present

## 2019-09-13 DIAGNOSIS — N1832 Chronic kidney disease, stage 3b: Secondary | ICD-10-CM | POA: Diagnosis not present

## 2019-09-13 DIAGNOSIS — L97819 Non-pressure chronic ulcer of other part of right lower leg with unspecified severity: Secondary | ICD-10-CM | POA: Diagnosis not present

## 2019-09-13 DIAGNOSIS — I890 Lymphedema, not elsewhere classified: Secondary | ICD-10-CM | POA: Diagnosis not present

## 2019-09-13 DIAGNOSIS — I2510 Atherosclerotic heart disease of native coronary artery without angina pectoris: Secondary | ICD-10-CM | POA: Diagnosis not present

## 2019-09-13 DIAGNOSIS — I1310 Hypertensive heart and chronic kidney disease without heart failure, with stage 1 through stage 4 chronic kidney disease, or unspecified chronic kidney disease: Secondary | ICD-10-CM | POA: Diagnosis not present

## 2019-09-13 DIAGNOSIS — J449 Chronic obstructive pulmonary disease, unspecified: Secondary | ICD-10-CM | POA: Diagnosis not present

## 2019-09-13 DIAGNOSIS — E1151 Type 2 diabetes mellitus with diabetic peripheral angiopathy without gangrene: Secondary | ICD-10-CM | POA: Diagnosis not present

## 2019-09-13 DIAGNOSIS — E1122 Type 2 diabetes mellitus with diabetic chronic kidney disease: Secondary | ICD-10-CM | POA: Diagnosis not present

## 2019-09-15 DIAGNOSIS — I10 Essential (primary) hypertension: Secondary | ICD-10-CM | POA: Diagnosis not present

## 2019-09-15 DIAGNOSIS — L233 Allergic contact dermatitis due to drugs in contact with skin: Secondary | ICD-10-CM | POA: Diagnosis not present

## 2019-09-16 DIAGNOSIS — I1310 Hypertensive heart and chronic kidney disease without heart failure, with stage 1 through stage 4 chronic kidney disease, or unspecified chronic kidney disease: Secondary | ICD-10-CM | POA: Diagnosis not present

## 2019-09-16 DIAGNOSIS — I2510 Atherosclerotic heart disease of native coronary artery without angina pectoris: Secondary | ICD-10-CM | POA: Diagnosis not present

## 2019-09-16 DIAGNOSIS — L97819 Non-pressure chronic ulcer of other part of right lower leg with unspecified severity: Secondary | ICD-10-CM | POA: Diagnosis not present

## 2019-09-16 DIAGNOSIS — K921 Melena: Secondary | ICD-10-CM | POA: Diagnosis not present

## 2019-09-16 DIAGNOSIS — L97829 Non-pressure chronic ulcer of other part of left lower leg with unspecified severity: Secondary | ICD-10-CM | POA: Diagnosis not present

## 2019-09-16 DIAGNOSIS — I890 Lymphedema, not elsewhere classified: Secondary | ICD-10-CM | POA: Diagnosis not present

## 2019-09-16 DIAGNOSIS — E1122 Type 2 diabetes mellitus with diabetic chronic kidney disease: Secondary | ICD-10-CM | POA: Diagnosis not present

## 2019-09-16 DIAGNOSIS — N1832 Chronic kidney disease, stage 3b: Secondary | ICD-10-CM | POA: Diagnosis not present

## 2019-09-16 DIAGNOSIS — E1151 Type 2 diabetes mellitus with diabetic peripheral angiopathy without gangrene: Secondary | ICD-10-CM | POA: Diagnosis not present

## 2019-09-16 DIAGNOSIS — K591 Functional diarrhea: Secondary | ICD-10-CM | POA: Diagnosis not present

## 2019-09-16 DIAGNOSIS — L290 Pruritus ani: Secondary | ICD-10-CM | POA: Diagnosis not present

## 2019-09-16 DIAGNOSIS — J449 Chronic obstructive pulmonary disease, unspecified: Secondary | ICD-10-CM | POA: Diagnosis not present

## 2019-09-17 ENCOUNTER — Other Ambulatory Visit: Payer: Self-pay

## 2019-09-17 MED ORDER — ATENOLOL-CHLORTHALIDONE 100-25 MG PO TABS: 1.0000 | ORAL_TABLET | Freq: Every day | ORAL | 2 refills | Status: DC

## 2019-09-20 DIAGNOSIS — J449 Chronic obstructive pulmonary disease, unspecified: Secondary | ICD-10-CM | POA: Diagnosis not present

## 2019-09-20 DIAGNOSIS — L97829 Non-pressure chronic ulcer of other part of left lower leg with unspecified severity: Secondary | ICD-10-CM | POA: Diagnosis not present

## 2019-09-20 DIAGNOSIS — N1832 Chronic kidney disease, stage 3b: Secondary | ICD-10-CM | POA: Diagnosis not present

## 2019-09-20 DIAGNOSIS — E1122 Type 2 diabetes mellitus with diabetic chronic kidney disease: Secondary | ICD-10-CM | POA: Diagnosis not present

## 2019-09-20 DIAGNOSIS — I1310 Hypertensive heart and chronic kidney disease without heart failure, with stage 1 through stage 4 chronic kidney disease, or unspecified chronic kidney disease: Secondary | ICD-10-CM | POA: Diagnosis not present

## 2019-09-20 DIAGNOSIS — E1151 Type 2 diabetes mellitus with diabetic peripheral angiopathy without gangrene: Secondary | ICD-10-CM | POA: Diagnosis not present

## 2019-09-20 DIAGNOSIS — I890 Lymphedema, not elsewhere classified: Secondary | ICD-10-CM | POA: Diagnosis not present

## 2019-09-20 DIAGNOSIS — I2510 Atherosclerotic heart disease of native coronary artery without angina pectoris: Secondary | ICD-10-CM | POA: Diagnosis not present

## 2019-09-20 DIAGNOSIS — L97819 Non-pressure chronic ulcer of other part of right lower leg with unspecified severity: Secondary | ICD-10-CM | POA: Diagnosis not present

## 2019-09-23 DIAGNOSIS — N1832 Chronic kidney disease, stage 3b: Secondary | ICD-10-CM | POA: Diagnosis not present

## 2019-09-23 DIAGNOSIS — J449 Chronic obstructive pulmonary disease, unspecified: Secondary | ICD-10-CM | POA: Diagnosis not present

## 2019-09-23 DIAGNOSIS — L97819 Non-pressure chronic ulcer of other part of right lower leg with unspecified severity: Secondary | ICD-10-CM | POA: Diagnosis not present

## 2019-09-23 DIAGNOSIS — I1310 Hypertensive heart and chronic kidney disease without heart failure, with stage 1 through stage 4 chronic kidney disease, or unspecified chronic kidney disease: Secondary | ICD-10-CM | POA: Diagnosis not present

## 2019-09-23 DIAGNOSIS — E1122 Type 2 diabetes mellitus with diabetic chronic kidney disease: Secondary | ICD-10-CM | POA: Diagnosis not present

## 2019-09-23 DIAGNOSIS — I2510 Atherosclerotic heart disease of native coronary artery without angina pectoris: Secondary | ICD-10-CM | POA: Diagnosis not present

## 2019-09-23 DIAGNOSIS — I890 Lymphedema, not elsewhere classified: Secondary | ICD-10-CM | POA: Diagnosis not present

## 2019-09-23 DIAGNOSIS — L97829 Non-pressure chronic ulcer of other part of left lower leg with unspecified severity: Secondary | ICD-10-CM | POA: Diagnosis not present

## 2019-09-23 DIAGNOSIS — E1151 Type 2 diabetes mellitus with diabetic peripheral angiopathy without gangrene: Secondary | ICD-10-CM | POA: Diagnosis not present

## 2019-09-27 DIAGNOSIS — E1151 Type 2 diabetes mellitus with diabetic peripheral angiopathy without gangrene: Secondary | ICD-10-CM | POA: Diagnosis not present

## 2019-09-27 DIAGNOSIS — J449 Chronic obstructive pulmonary disease, unspecified: Secondary | ICD-10-CM | POA: Diagnosis not present

## 2019-09-27 DIAGNOSIS — L97829 Non-pressure chronic ulcer of other part of left lower leg with unspecified severity: Secondary | ICD-10-CM | POA: Diagnosis not present

## 2019-09-27 DIAGNOSIS — E1122 Type 2 diabetes mellitus with diabetic chronic kidney disease: Secondary | ICD-10-CM | POA: Diagnosis not present

## 2019-09-27 DIAGNOSIS — I1310 Hypertensive heart and chronic kidney disease without heart failure, with stage 1 through stage 4 chronic kidney disease, or unspecified chronic kidney disease: Secondary | ICD-10-CM | POA: Diagnosis not present

## 2019-09-27 DIAGNOSIS — L97819 Non-pressure chronic ulcer of other part of right lower leg with unspecified severity: Secondary | ICD-10-CM | POA: Diagnosis not present

## 2019-09-27 DIAGNOSIS — I2510 Atherosclerotic heart disease of native coronary artery without angina pectoris: Secondary | ICD-10-CM | POA: Diagnosis not present

## 2019-09-27 DIAGNOSIS — I890 Lymphedema, not elsewhere classified: Secondary | ICD-10-CM | POA: Diagnosis not present

## 2019-09-27 DIAGNOSIS — N1832 Chronic kidney disease, stage 3b: Secondary | ICD-10-CM | POA: Diagnosis not present

## 2019-09-30 DIAGNOSIS — I890 Lymphedema, not elsewhere classified: Secondary | ICD-10-CM | POA: Diagnosis not present

## 2019-09-30 DIAGNOSIS — N1832 Chronic kidney disease, stage 3b: Secondary | ICD-10-CM | POA: Diagnosis not present

## 2019-09-30 DIAGNOSIS — E1122 Type 2 diabetes mellitus with diabetic chronic kidney disease: Secondary | ICD-10-CM | POA: Diagnosis not present

## 2019-09-30 DIAGNOSIS — J449 Chronic obstructive pulmonary disease, unspecified: Secondary | ICD-10-CM | POA: Diagnosis not present

## 2019-09-30 DIAGNOSIS — I2510 Atherosclerotic heart disease of native coronary artery without angina pectoris: Secondary | ICD-10-CM | POA: Diagnosis not present

## 2019-09-30 DIAGNOSIS — I1310 Hypertensive heart and chronic kidney disease without heart failure, with stage 1 through stage 4 chronic kidney disease, or unspecified chronic kidney disease: Secondary | ICD-10-CM | POA: Diagnosis not present

## 2019-09-30 DIAGNOSIS — L97819 Non-pressure chronic ulcer of other part of right lower leg with unspecified severity: Secondary | ICD-10-CM | POA: Diagnosis not present

## 2019-09-30 DIAGNOSIS — L97829 Non-pressure chronic ulcer of other part of left lower leg with unspecified severity: Secondary | ICD-10-CM | POA: Diagnosis not present

## 2019-09-30 DIAGNOSIS — E1151 Type 2 diabetes mellitus with diabetic peripheral angiopathy without gangrene: Secondary | ICD-10-CM | POA: Diagnosis not present

## 2019-10-01 DIAGNOSIS — I890 Lymphedema, not elsewhere classified: Secondary | ICD-10-CM | POA: Diagnosis not present

## 2019-10-01 DIAGNOSIS — L97829 Non-pressure chronic ulcer of other part of left lower leg with unspecified severity: Secondary | ICD-10-CM | POA: Diagnosis not present

## 2019-10-01 DIAGNOSIS — N1832 Chronic kidney disease, stage 3b: Secondary | ICD-10-CM | POA: Diagnosis not present

## 2019-10-01 DIAGNOSIS — E1142 Type 2 diabetes mellitus with diabetic polyneuropathy: Secondary | ICD-10-CM | POA: Diagnosis not present

## 2019-10-01 DIAGNOSIS — I2510 Atherosclerotic heart disease of native coronary artery without angina pectoris: Secondary | ICD-10-CM | POA: Diagnosis not present

## 2019-10-01 DIAGNOSIS — L97819 Non-pressure chronic ulcer of other part of right lower leg with unspecified severity: Secondary | ICD-10-CM | POA: Diagnosis not present

## 2019-10-01 DIAGNOSIS — I1310 Hypertensive heart and chronic kidney disease without heart failure, with stage 1 through stage 4 chronic kidney disease, or unspecified chronic kidney disease: Secondary | ICD-10-CM | POA: Diagnosis not present

## 2019-10-01 DIAGNOSIS — E1151 Type 2 diabetes mellitus with diabetic peripheral angiopathy without gangrene: Secondary | ICD-10-CM | POA: Diagnosis not present

## 2019-10-01 DIAGNOSIS — E1122 Type 2 diabetes mellitus with diabetic chronic kidney disease: Secondary | ICD-10-CM | POA: Diagnosis not present

## 2019-10-01 DIAGNOSIS — I872 Venous insufficiency (chronic) (peripheral): Secondary | ICD-10-CM | POA: Diagnosis not present

## 2019-10-01 DIAGNOSIS — J449 Chronic obstructive pulmonary disease, unspecified: Secondary | ICD-10-CM | POA: Diagnosis not present

## 2019-10-02 ENCOUNTER — Ambulatory Visit: Payer: Self-pay | Admitting: Legal Medicine

## 2019-10-04 ENCOUNTER — Telehealth: Payer: Self-pay

## 2019-10-04 ENCOUNTER — Ambulatory Visit: Payer: Medicare HMO | Admitting: Legal Medicine

## 2019-10-04 ENCOUNTER — Other Ambulatory Visit: Payer: Self-pay | Admitting: Legal Medicine

## 2019-10-04 DIAGNOSIS — N1832 Chronic kidney disease, stage 3b: Secondary | ICD-10-CM | POA: Diagnosis not present

## 2019-10-04 DIAGNOSIS — I1310 Hypertensive heart and chronic kidney disease without heart failure, with stage 1 through stage 4 chronic kidney disease, or unspecified chronic kidney disease: Secondary | ICD-10-CM | POA: Diagnosis not present

## 2019-10-04 DIAGNOSIS — I890 Lymphedema, not elsewhere classified: Secondary | ICD-10-CM | POA: Diagnosis not present

## 2019-10-04 DIAGNOSIS — J449 Chronic obstructive pulmonary disease, unspecified: Secondary | ICD-10-CM | POA: Diagnosis not present

## 2019-10-04 DIAGNOSIS — M7552 Bursitis of left shoulder: Secondary | ICD-10-CM

## 2019-10-04 DIAGNOSIS — I872 Venous insufficiency (chronic) (peripheral): Secondary | ICD-10-CM | POA: Diagnosis not present

## 2019-10-04 DIAGNOSIS — I2510 Atherosclerotic heart disease of native coronary artery without angina pectoris: Secondary | ICD-10-CM | POA: Diagnosis not present

## 2019-10-04 DIAGNOSIS — E1122 Type 2 diabetes mellitus with diabetic chronic kidney disease: Secondary | ICD-10-CM | POA: Diagnosis not present

## 2019-10-04 DIAGNOSIS — E1142 Type 2 diabetes mellitus with diabetic polyneuropathy: Secondary | ICD-10-CM | POA: Diagnosis not present

## 2019-10-04 DIAGNOSIS — E1151 Type 2 diabetes mellitus with diabetic peripheral angiopathy without gangrene: Secondary | ICD-10-CM | POA: Diagnosis not present

## 2019-10-04 MED ORDER — PREDNISONE 10 MG (21) PO TBPK: ORAL_TABLET | ORAL | 0 refills | Status: DC

## 2019-10-04 NOTE — Telephone Encounter (Signed)
Home health called on behalf of Patient, patient cant really use his arm due to shoulder pain, he had an appoinment to receive an injection in it on Wednesday and could not get dressed or drive to appointment due to shoulder hurting, she states he takes oxycodone and pain level is at a 7/10 but they are wondering if there is anything that can be done. Patient does not want to go to the ed. [phone]

## 2019-10-04 NOTE — Progress Notes (Unsigned)
prednisone

## 2019-10-05 ENCOUNTER — Encounter: Payer: Self-pay | Admitting: Legal Medicine

## 2019-10-05 DIAGNOSIS — J449 Chronic obstructive pulmonary disease, unspecified: Secondary | ICD-10-CM | POA: Diagnosis not present

## 2019-10-05 DIAGNOSIS — E1142 Type 2 diabetes mellitus with diabetic polyneuropathy: Secondary | ICD-10-CM | POA: Diagnosis not present

## 2019-10-05 DIAGNOSIS — M19012 Primary osteoarthritis, left shoulder: Secondary | ICD-10-CM | POA: Diagnosis not present

## 2019-10-05 DIAGNOSIS — I129 Hypertensive chronic kidney disease with stage 1 through stage 4 chronic kidney disease, or unspecified chronic kidney disease: Secondary | ICD-10-CM | POA: Diagnosis not present

## 2019-10-05 DIAGNOSIS — I872 Venous insufficiency (chronic) (peripheral): Secondary | ICD-10-CM | POA: Diagnosis not present

## 2019-10-05 DIAGNOSIS — N1832 Chronic kidney disease, stage 3b: Secondary | ICD-10-CM | POA: Diagnosis not present

## 2019-10-05 DIAGNOSIS — N289 Disorder of kidney and ureter, unspecified: Secondary | ICD-10-CM | POA: Diagnosis not present

## 2019-10-05 DIAGNOSIS — I1310 Hypertensive heart and chronic kidney disease without heart failure, with stage 1 through stage 4 chronic kidney disease, or unspecified chronic kidney disease: Secondary | ICD-10-CM | POA: Diagnosis not present

## 2019-10-05 DIAGNOSIS — M1A09X0 Idiopathic chronic gout, multiple sites, without tophus (tophi): Secondary | ICD-10-CM | POA: Diagnosis not present

## 2019-10-05 DIAGNOSIS — Z6841 Body Mass Index (BMI) 40.0 and over, adult: Secondary | ICD-10-CM | POA: Diagnosis not present

## 2019-10-05 DIAGNOSIS — I2510 Atherosclerotic heart disease of native coronary artery without angina pectoris: Secondary | ICD-10-CM | POA: Diagnosis not present

## 2019-10-05 DIAGNOSIS — I517 Cardiomegaly: Secondary | ICD-10-CM | POA: Diagnosis not present

## 2019-10-05 DIAGNOSIS — M19011 Primary osteoarthritis, right shoulder: Secondary | ICD-10-CM | POA: Diagnosis not present

## 2019-10-05 DIAGNOSIS — M79602 Pain in left arm: Secondary | ICD-10-CM | POA: Diagnosis not present

## 2019-10-05 DIAGNOSIS — J811 Chronic pulmonary edema: Secondary | ICD-10-CM | POA: Diagnosis not present

## 2019-10-05 DIAGNOSIS — E1165 Type 2 diabetes mellitus with hyperglycemia: Secondary | ICD-10-CM | POA: Diagnosis not present

## 2019-10-05 DIAGNOSIS — R52 Pain, unspecified: Secondary | ICD-10-CM | POA: Diagnosis not present

## 2019-10-05 DIAGNOSIS — R531 Weakness: Secondary | ICD-10-CM | POA: Diagnosis not present

## 2019-10-05 DIAGNOSIS — R600 Localized edema: Secondary | ICD-10-CM | POA: Diagnosis not present

## 2019-10-05 DIAGNOSIS — Z7901 Long term (current) use of anticoagulants: Secondary | ICD-10-CM | POA: Diagnosis not present

## 2019-10-05 DIAGNOSIS — N189 Chronic kidney disease, unspecified: Secondary | ICD-10-CM | POA: Diagnosis not present

## 2019-10-05 DIAGNOSIS — R0902 Hypoxemia: Secondary | ICD-10-CM | POA: Diagnosis not present

## 2019-10-05 DIAGNOSIS — L03114 Cellulitis of left upper limb: Secondary | ICD-10-CM | POA: Diagnosis not present

## 2019-10-05 DIAGNOSIS — I10 Essential (primary) hypertension: Secondary | ICD-10-CM | POA: Diagnosis not present

## 2019-10-05 DIAGNOSIS — M47812 Spondylosis without myelopathy or radiculopathy, cervical region: Secondary | ICD-10-CM | POA: Diagnosis not present

## 2019-10-05 DIAGNOSIS — E1151 Type 2 diabetes mellitus with diabetic peripheral angiopathy without gangrene: Secondary | ICD-10-CM | POA: Diagnosis not present

## 2019-10-05 DIAGNOSIS — R609 Edema, unspecified: Secondary | ICD-10-CM | POA: Diagnosis not present

## 2019-10-05 DIAGNOSIS — N179 Acute kidney failure, unspecified: Secondary | ICD-10-CM | POA: Diagnosis not present

## 2019-10-05 DIAGNOSIS — I890 Lymphedema, not elsewhere classified: Secondary | ICD-10-CM | POA: Diagnosis not present

## 2019-10-05 DIAGNOSIS — E1122 Type 2 diabetes mellitus with diabetic chronic kidney disease: Secondary | ICD-10-CM | POA: Diagnosis not present

## 2019-10-05 DIAGNOSIS — Z86718 Personal history of other venous thrombosis and embolism: Secondary | ICD-10-CM | POA: Diagnosis not present

## 2019-10-06 DIAGNOSIS — M109 Gout, unspecified: Secondary | ICD-10-CM

## 2019-10-06 DIAGNOSIS — R600 Localized edema: Secondary | ICD-10-CM | POA: Diagnosis not present

## 2019-10-06 DIAGNOSIS — M79602 Pain in left arm: Secondary | ICD-10-CM | POA: Diagnosis not present

## 2019-10-06 DIAGNOSIS — M19012 Primary osteoarthritis, left shoulder: Secondary | ICD-10-CM | POA: Diagnosis not present

## 2019-10-06 HISTORY — DX: Gout, unspecified: M10.9

## 2019-10-07 DIAGNOSIS — R600 Localized edema: Secondary | ICD-10-CM | POA: Diagnosis not present

## 2019-10-07 DIAGNOSIS — M19012 Primary osteoarthritis, left shoulder: Secondary | ICD-10-CM | POA: Diagnosis not present

## 2019-10-07 DIAGNOSIS — M79602 Pain in left arm: Secondary | ICD-10-CM | POA: Diagnosis not present

## 2019-10-07 DIAGNOSIS — M109 Gout, unspecified: Secondary | ICD-10-CM | POA: Diagnosis not present

## 2019-10-08 DIAGNOSIS — M79602 Pain in left arm: Secondary | ICD-10-CM | POA: Diagnosis not present

## 2019-10-08 LAB — PROTIME-INR
INR: 1.2 — AB (ref 0.9–1.1)
Prothrombin Time: 13

## 2019-10-09 ENCOUNTER — Telehealth: Payer: Self-pay

## 2019-10-09 DIAGNOSIS — I1310 Hypertensive heart and chronic kidney disease without heart failure, with stage 1 through stage 4 chronic kidney disease, or unspecified chronic kidney disease: Secondary | ICD-10-CM | POA: Diagnosis not present

## 2019-10-09 DIAGNOSIS — N1832 Chronic kidney disease, stage 3b: Secondary | ICD-10-CM | POA: Diagnosis not present

## 2019-10-09 DIAGNOSIS — J449 Chronic obstructive pulmonary disease, unspecified: Secondary | ICD-10-CM | POA: Diagnosis not present

## 2019-10-09 DIAGNOSIS — E1122 Type 2 diabetes mellitus with diabetic chronic kidney disease: Secondary | ICD-10-CM | POA: Diagnosis not present

## 2019-10-09 DIAGNOSIS — E1151 Type 2 diabetes mellitus with diabetic peripheral angiopathy without gangrene: Secondary | ICD-10-CM | POA: Diagnosis not present

## 2019-10-09 DIAGNOSIS — I2510 Atherosclerotic heart disease of native coronary artery without angina pectoris: Secondary | ICD-10-CM | POA: Diagnosis not present

## 2019-10-09 DIAGNOSIS — I872 Venous insufficiency (chronic) (peripheral): Secondary | ICD-10-CM | POA: Diagnosis not present

## 2019-10-09 DIAGNOSIS — I890 Lymphedema, not elsewhere classified: Secondary | ICD-10-CM | POA: Diagnosis not present

## 2019-10-09 DIAGNOSIS — E1142 Type 2 diabetes mellitus with diabetic polyneuropathy: Secondary | ICD-10-CM | POA: Diagnosis not present

## 2019-10-09 DIAGNOSIS — M1A09X0 Idiopathic chronic gout, multiple sites, without tophus (tophi): Secondary | ICD-10-CM

## 2019-10-09 NOTE — Telephone Encounter (Signed)
  Transition Care Management Follow-up Telephone Call  Matthew Gutierrez Aug 23, 1955  Admit Date: 10/06/19 Discharge Date: 10/08/19 Diagnoses: Gout   2 day post discharge: 10/10/19 7 day post discharge: 10/15/19 14 day post discharge: 10/22/19  Matthew Gutierrez was discharged from [facility] on 10/08/19 with the diagnoses listed above.  He was contacted today via telephone in regards to transition of care.    He presented to the ED with complaints of left upper extremity pain and swelling x 4 days.  CRP>270, ESR 140, Uric Acid 8.8.  Pain and edema have improved since starting Colchicine in the hospital.    He was discharged with Colchicine 0.6 BID x 7 days, Allopurinol 300 mg QD, and Keflex 500 mg Q8 #15.  He states that his pain is continuing to improve.  Discharge Instructions: F/U with PCP in one week for CBC & CMP, Consider Rheumatology Consult, Home Health PT/OT ordered  Items Reviewed:  Medications reviewed: yes  Allergies reviewed: yes  Dietary changes reviewed: yes  Referrals reviewed: yes   Any patient concerns? no   Confirmed importance and date/time of follow-up visits scheduled yes  Provider Appointment booked with Dr Henrene Pastor 10/15/19  Confirmed with patient if condition begins to worsen call PCP or go to the ER.  Patient was given the office number and encouraged to call back with question or concerns.  : yes   Shelle Iron, LPN 03/29/58 4:58 PM

## 2019-10-10 DIAGNOSIS — E1142 Type 2 diabetes mellitus with diabetic polyneuropathy: Secondary | ICD-10-CM | POA: Diagnosis not present

## 2019-10-10 DIAGNOSIS — I2510 Atherosclerotic heart disease of native coronary artery without angina pectoris: Secondary | ICD-10-CM | POA: Diagnosis not present

## 2019-10-10 DIAGNOSIS — I890 Lymphedema, not elsewhere classified: Secondary | ICD-10-CM | POA: Diagnosis not present

## 2019-10-10 DIAGNOSIS — I872 Venous insufficiency (chronic) (peripheral): Secondary | ICD-10-CM | POA: Diagnosis not present

## 2019-10-10 DIAGNOSIS — J449 Chronic obstructive pulmonary disease, unspecified: Secondary | ICD-10-CM | POA: Diagnosis not present

## 2019-10-10 DIAGNOSIS — N1832 Chronic kidney disease, stage 3b: Secondary | ICD-10-CM | POA: Diagnosis not present

## 2019-10-10 DIAGNOSIS — E1151 Type 2 diabetes mellitus with diabetic peripheral angiopathy without gangrene: Secondary | ICD-10-CM | POA: Diagnosis not present

## 2019-10-10 DIAGNOSIS — E1122 Type 2 diabetes mellitus with diabetic chronic kidney disease: Secondary | ICD-10-CM | POA: Diagnosis not present

## 2019-10-10 DIAGNOSIS — I1310 Hypertensive heart and chronic kidney disease without heart failure, with stage 1 through stage 4 chronic kidney disease, or unspecified chronic kidney disease: Secondary | ICD-10-CM | POA: Diagnosis not present

## 2019-10-11 DIAGNOSIS — J449 Chronic obstructive pulmonary disease, unspecified: Secondary | ICD-10-CM | POA: Diagnosis not present

## 2019-10-11 DIAGNOSIS — J9601 Acute respiratory failure with hypoxia: Secondary | ICD-10-CM | POA: Diagnosis not present

## 2019-10-13 DIAGNOSIS — I872 Venous insufficiency (chronic) (peripheral): Secondary | ICD-10-CM | POA: Diagnosis not present

## 2019-10-13 DIAGNOSIS — N1832 Chronic kidney disease, stage 3b: Secondary | ICD-10-CM | POA: Diagnosis not present

## 2019-10-13 DIAGNOSIS — J449 Chronic obstructive pulmonary disease, unspecified: Secondary | ICD-10-CM | POA: Diagnosis not present

## 2019-10-13 DIAGNOSIS — E1122 Type 2 diabetes mellitus with diabetic chronic kidney disease: Secondary | ICD-10-CM | POA: Diagnosis not present

## 2019-10-13 DIAGNOSIS — I1310 Hypertensive heart and chronic kidney disease without heart failure, with stage 1 through stage 4 chronic kidney disease, or unspecified chronic kidney disease: Secondary | ICD-10-CM | POA: Diagnosis not present

## 2019-10-13 DIAGNOSIS — I890 Lymphedema, not elsewhere classified: Secondary | ICD-10-CM | POA: Diagnosis not present

## 2019-10-13 DIAGNOSIS — I2510 Atherosclerotic heart disease of native coronary artery without angina pectoris: Secondary | ICD-10-CM | POA: Diagnosis not present

## 2019-10-13 DIAGNOSIS — E1142 Type 2 diabetes mellitus with diabetic polyneuropathy: Secondary | ICD-10-CM | POA: Diagnosis not present

## 2019-10-13 DIAGNOSIS — E1151 Type 2 diabetes mellitus with diabetic peripheral angiopathy without gangrene: Secondary | ICD-10-CM | POA: Diagnosis not present

## 2019-10-14 DIAGNOSIS — E1142 Type 2 diabetes mellitus with diabetic polyneuropathy: Secondary | ICD-10-CM | POA: Diagnosis not present

## 2019-10-14 DIAGNOSIS — E1122 Type 2 diabetes mellitus with diabetic chronic kidney disease: Secondary | ICD-10-CM | POA: Diagnosis not present

## 2019-10-14 DIAGNOSIS — I1310 Hypertensive heart and chronic kidney disease without heart failure, with stage 1 through stage 4 chronic kidney disease, or unspecified chronic kidney disease: Secondary | ICD-10-CM | POA: Diagnosis not present

## 2019-10-14 DIAGNOSIS — N1832 Chronic kidney disease, stage 3b: Secondary | ICD-10-CM | POA: Diagnosis not present

## 2019-10-14 DIAGNOSIS — E1151 Type 2 diabetes mellitus with diabetic peripheral angiopathy without gangrene: Secondary | ICD-10-CM | POA: Diagnosis not present

## 2019-10-14 DIAGNOSIS — I872 Venous insufficiency (chronic) (peripheral): Secondary | ICD-10-CM | POA: Diagnosis not present

## 2019-10-14 DIAGNOSIS — I890 Lymphedema, not elsewhere classified: Secondary | ICD-10-CM | POA: Diagnosis not present

## 2019-10-14 DIAGNOSIS — J449 Chronic obstructive pulmonary disease, unspecified: Secondary | ICD-10-CM | POA: Diagnosis not present

## 2019-10-14 DIAGNOSIS — I2510 Atherosclerotic heart disease of native coronary artery without angina pectoris: Secondary | ICD-10-CM | POA: Diagnosis not present

## 2019-10-15 ENCOUNTER — Ambulatory Visit (INDEPENDENT_AMBULATORY_CARE_PROVIDER_SITE_OTHER): Payer: Medicare HMO | Admitting: Legal Medicine

## 2019-10-15 ENCOUNTER — Encounter: Payer: Self-pay | Admitting: Legal Medicine

## 2019-10-15 ENCOUNTER — Other Ambulatory Visit: Payer: Self-pay

## 2019-10-15 VITALS — BP 150/80 | HR 70 | Temp 97.3°F | Resp 17 | Ht 72.0 in | Wt 312.0 lb

## 2019-10-15 DIAGNOSIS — I87312 Chronic venous hypertension (idiopathic) with ulcer of left lower extremity: Secondary | ICD-10-CM | POA: Diagnosis not present

## 2019-10-15 DIAGNOSIS — N1831 Chronic kidney disease, stage 3a: Secondary | ICD-10-CM

## 2019-10-15 DIAGNOSIS — M7552 Bursitis of left shoulder: Secondary | ICD-10-CM | POA: Diagnosis not present

## 2019-10-15 DIAGNOSIS — E782 Mixed hyperlipidemia: Secondary | ICD-10-CM | POA: Diagnosis not present

## 2019-10-15 DIAGNOSIS — E1159 Type 2 diabetes mellitus with other circulatory complications: Secondary | ICD-10-CM

## 2019-10-15 DIAGNOSIS — M10012 Idiopathic gout, left shoulder: Secondary | ICD-10-CM | POA: Diagnosis not present

## 2019-10-15 DIAGNOSIS — E1121 Type 2 diabetes mellitus with diabetic nephropathy: Secondary | ICD-10-CM

## 2019-10-15 DIAGNOSIS — Z86718 Personal history of other venous thrombosis and embolism: Secondary | ICD-10-CM | POA: Diagnosis not present

## 2019-10-15 DIAGNOSIS — G4733 Obstructive sleep apnea (adult) (pediatric): Secondary | ICD-10-CM

## 2019-10-15 DIAGNOSIS — L97929 Non-pressure chronic ulcer of unspecified part of left lower leg with unspecified severity: Secondary | ICD-10-CM

## 2019-10-15 HISTORY — DX: Type 2 diabetes mellitus with diabetic nephropathy: E11.21

## 2019-10-15 HISTORY — DX: Type 2 diabetes mellitus with other circulatory complications: E11.59

## 2019-10-15 LAB — POCT UA - MICROALBUMIN: Microalbumin Ur, POC: 150 mg/L

## 2019-10-15 NOTE — Progress Notes (Signed)
Established Patient Office Visit  Subjective:  Patient ID: Matthew Gutierrez, male    DOB: 1956/03/03  Age: 64 y.o. MRN: [id number]  CC:  Chief Complaint  Patient presents with  . Transitions Of Care    Patient was admitted at [facility] from 10/06/2019 to 10/08/2019  lives in assisted housing on [address].  HPI Kymir Coles presents for transition of care and reconciliation of medicines.  He was admitted on 10/06/2019 with high CRP and found his arm swelling was flair of gout and he was treated with colchicine..  X-rays were normal of shoulder, UE dopplers showed no clots.  He was discharged on 10/08/2019.  He still has shoulder pain.  He is improved.  His last INR was 1.2. hemoglobin was 9.8 in hospital.  Patient present with type 2 diabetes.  Specifically, this is type 2, no insulin requiring diabetes, complicated by polyneuropathy and renal failure.  Compliance with treatment has been good; patient take medicines as directed, maintains diet and exercise regimen, follows up as directed, and is keeping glucose diary.  Date of  diagnosis 2010.  Depression screen has been performed.Tobacco screen nonsmoker. Current medicines for diabetes metformin.  Patient is on lisinopril for renal protection and none for cholesterol control.  Patient performs foot exams daily and last ophthalmologic exam was not in 2 years.  Patient presents for follow up of hypertension.  Patient tolerating lisinopril,hydralazine  well with side effects.  Patient was diagnosed with hypertension 2010 so has been treated for hypertension for 10 years.Patient is working on maintaining diet and exercise regimen and follows up as directed. Complication include none  Patient presents with hyperlipidemia.  Compliance with treatment has been good; patient takes medicines as directed, maintains low cholesterol diet, follows up as directed, and maintains exercise regimen.  Patient is using pravastatin without  problems..  Past Medical History:  Diagnosis Date  . Epilepsy, grand mal ([place])   . Frozen shoulder    Right Shoulder  . Lumbago   . Peripheral vascular disease ([place])   . Stroke [facility])     Past Surgical History:  Procedure Laterality Date  . CORONARY STENT PLACEMENT      Family History  Problem Relation Age of Onset  . Cancer Mother   . Stroke Father   . Hypertension Other   . Stroke Other   . Hyperlipidemia Other   . Alcohol abuse Other   . Heart disease Other   . Cancer Other     Social History   Socioeconomic History  . Marital status: Divorced    Spouse name: Not on file  . Number of children: 2  . Years of education: Not on file  . Highest education level: Not on file  Occupational History  . Occupation: Disability  Tobacco Use  . Smoking status: Former Smoker    Quit date: 10/28/2009    Years since quitting: 9.9  . Smokeless tobacco: Never Used  Substance and Sexual Activity  . Alcohol use: Yes    Comment: occassionally  . Drug use: No  . Sexual activity: Not Currently  Other Topics Concern  . Not on file  Social History Narrative  . Not on file   Social Determinants of Health   Financial Resource Strain:   . Difficulty of Paying Living Expenses:   Food Insecurity:   . Worried About Charity fundraiser in the Last Year:   . Arboriculturist in the Last Year:   Transportation Needs:   .  Lack of Transportation (Medical):   Marland Kitchen Lack of Transportation (Non-Medical):   Physical Activity: Inactive  . Days of Exercise per Week: 0 days  . Minutes of Exercise per Session: 0 min  Stress:   . Feeling of Stress :   Social Connections:   . Frequency of Communication with Friends and Family:   . Frequency of Social Gatherings with Friends and Family:   . Attends Religious Services:   . Active Member of Clubs or Organizations:   . Attends Archivist Meetings:   Marland Kitchen Marital Status:   Intimate Partner Violence: Not At Risk  . Fear of Current or  Ex-Partner: No  . Emotionally Abused: No  . Physically Abused: No  . Sexually Abused: No    Outpatient Medications Prior to Visit  Medication Sig Dispense Refill  . allopurinol (ZYLOPRIM) 300 MG tablet Take 300 mg by mouth daily.    Marland Kitchen atenolol-chlorthalidone (TENORETIC) 100-25 MG tablet Take 1 tablet by mouth daily. 90 tablet 2  . clotrimazole-betamethasone (LOTRISONE) cream     . colchicine 0.6 MG tablet Take 0.6 mg by mouth daily.    . fesoterodine (TOVIAZ) 4 MG TB24 tablet Take 4 mg by mouth daily.    . furosemide (LASIX) 20 MG tablet Take 20 mg by mouth daily.    Marland Kitchen gabapentin (NEURONTIN) 300 MG capsule Take 300 mg by mouth at bedtime.    . hydrALAZINE (APRESOLINE) 10 MG tablet Take 2 tablets (20 mg total) by mouth 2 (two) times daily with a meal. 360 tablet 2  . lisinopril (ZESTRIL) 10 MG tablet Take 10 mg by mouth daily.    . metFORMIN (GLUCOPHAGE) 1000 MG tablet Take 1,000 mg by mouth 2 (two) times daily with a meal.    . MITIGARE 0.6 MG CAPS     . Omega-3 Fatty Acids (RA FISH OIL) 1000 MG CPDR Take by mouth.    . oxyCODONE-acetaminophen (PERCOCET) 10-325 MG tablet Take 1 tablet by mouth every 6 (six) hours as needed for pain. 30 tablet 0  . phenytoin (DILANTIN) 100 MG ER capsule Take 100 mg by mouth. Take three capsules twice daily    . pravastatin (PRAVACHOL) 40 MG tablet Take 40 mg by mouth daily.    Marland Kitchen warfarin (COUMADIN) 3 MG tablet Take 1 tablet (3 mg total) by mouth daily. 30 tablet 0  . warfarin (COUMADIN) 4 MG tablet Take 1 tablet (4 mg total) by mouth daily. 90 tablet 2  . Wheat Dextrin (BENEFIBER) POWD Take by mouth. Mix 1 tablespoonful in a drink once daily.    . predniSONE (STERAPRED UNI-PAK 21 TAB) 10 MG (21) TBPK tablet Take 6ills first day , then 5 pills day 2 and then cut down one pill day until gone (Patient not taking: Reported on 10/15/2019) 21 tablet 0  . cephALEXin (KEFLEX) 500 MG capsule Take 500 mg by mouth 4 (four) times daily.    Marland Kitchen triamcinolone cream  (KENALOG) 0.1 %      No facility-administered medications prior to visit.    Allergies  Allergen Reactions  . Codeine     ROS Review of Systems  Constitutional: Negative.   HENT: Negative.   Eyes: Negative.   Respiratory: Negative.   Cardiovascular: Negative.   Gastrointestinal: Negative.   Endocrine: Negative.   Genitourinary: Negative.   Musculoskeletal: Positive for arthralgias and joint swelling.  Allergic/Immunologic: Negative.   Neurological: Negative.   Psychiatric/Behavioral: Negative.       Objective:    Physical Exam  Constitutional: He is oriented to person, place, and time. He appears well-developed and well-nourished.  HENT:  Head: Normocephalic and atraumatic.  Right Ear: External ear normal.  Left Ear: External ear normal.  Nose: Nose normal.  Mouth/Throat: Oropharynx is clear and moist.  Eyes: Pupils are equal, round, and reactive to light. Conjunctivae and EOM are normal.  Cardiovascular: Normal rate, regular rhythm, normal heart sounds and intact distal pulses.  Pulmonary/Chest: Breath sounds normal.  Abdominal: Soft. Bowel sounds are normal.  Musculoskeletal:     Cervical back: Normal range of motion and neck supple.     Comments: Chronic stasis changes both legs with wrapped in unna boots nurse changes 2 times a week.  No further weepping.  The feet show no ulcers.  Neurological: He is alert and oriented to person, place, and time.  Skin: Skin is warm and dry.  Psychiatric: He has a normal mood and affect. His behavior is normal. Judgment and thought content normal.  Vitals reviewed.   BP (!) 150/80 (BP Location: Right Arm, Patient Position: Sitting)   Pulse 70   Temp (!) 97.3 F (36.3 C) (Temporal)   Resp 17   Ht 6' (1.829 m)   Wt (!) 312 lb (141.5 kg)   SpO2 94%   BMI 42.31 kg/m  Wt Readings from Last 3 Encounters:  10/15/19 (!) 312 lb (141.5 kg)  07/25/19 (!) 324 lb 3.2 oz (147.1 kg)  07/23/19 (!) 321 lb 12.8 oz (146 kg)      Health Maintenance Due  Topic Date Due  . Hepatitis C Screening  Never done  . COVID-19 Vaccine (1) Never done  . TETANUS/TDAP  Never done  . OPHTHALMOLOGY EXAM  07/25/2019    There are no preventive care reminders to display for this patient.  No results found for: TSH No results found for: WBC, HGB, HCT, MCV, PLT No results found for: NA, K, CHLORIDE, CO2, GLUCOSE, BUN, CREATININE, BILITOT, ALKPHOS, AST, ALT, PROT, ALBUMIN, CALCIUM, ANIONGAP, EGFR, GFR No results found for: CHOL No results found for: HDL No results found for: LDLCALC No results found for: TRIG No results found for: CHOLHDL Lab Results  Component Value Date   HGBA1C 8.4 06/07/2019      Assessment & Plan:  A total of 60 minutes were spent face-to-face with the patient during this encounter and over half of that time was spent on counseling and coordination of care.  Problem List Items Addressed This Visit      Cardiovascular and Mediastinum   Chronic venous hypertension (idiopathic) with ulcer of left lower extremity ([place])    Patient continues to have lymphedema and home health nurse continue to wrap 2 times a week both legs.        Respiratory   Obstructive sleep apnea    Patient not using CPAP any more.  We discussed the need but he refuses.        Endocrine   Diabetic glomerulopathy [facility])    An individual care plan for diabetes was established and reinforced today.  The patient's status was assessed using clinical findings on exam, labs and diagnostic testing. Patient success at meeting goals based on disease specific evidence-based guidelines and found to be fair controlled. Medications were assessed and patient's understanding of the medical issues , including barriers were assessed. Recommend adherence to a diabetic diet, a graduated exercise program, HgbA1c level is checked quarterly, and urine microalbumin performed yearly .  Annual mono-filament sensation testing performed. Lower blood  pressure  and control hyperlipidemia is important. Get annual eye exams and annual flu shots and smoking cessation discussed.  Self management goals were discussed.      Relevant Orders   POCT UA - Microalbumin (Completed)   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[place])    An individual care plan for diabetes was established and reinforced today.  The patient's status was assessed using clinical findings on exam, labs and diagnostic testing. Patient success at meeting goals based on disease specific evidence-based guidelines and found to be fair controlled. Medications were assessed and patient's understanding of the medical issues , including barriers were assessed. Recommend adherence to a diabetic diet, a graduated exercise program, HgbA1c level is checked quarterly, and urine microalbumin performed yearly .  Annual mono-filament sensation testing performed. Lower blood pressure and control hyperlipidemia is important. Get annual eye exams and annual flu shots and smoking cessation discussed.  Self management goals were discussed.      Relevant Orders   CBC with Differential/Platelet   Comprehensive metabolic panel   Hemoglobin A1c     Musculoskeletal and Integument   Shoulder bursitis    Patient has adhesive capsulitis left shoulder- recurrent.  I gave him exercises.  We will inject at next visit.        Genitourinary   Stage 3a chronic kidney disease    AN INDIVIDUAL CARE PLAN for kidney disease was established and reinforced today.  The patient's status was assessed using clinical findings on exam, labs, and other diagnostic testing. Patient's success at meeting treatment goals based on disease specific evidence-bassed guidelines and found to be in good control. RECOMMENDATIONS include maintaining present medicines and treatment.        Other   History of DVT (deep vein thrombosis)    Patient is on chronic anticoagulation.  It presently is not therapeutic.  We will recheck in 2 weeks.       Relevant Orders   Protime-INR   Mixed hyperlipidemia    AN INDIVIDUAL CARE PLAN for hyperlipidemia/ cholesterol was established and reinforced today.  The patient's status was assessed using clinical findings on exam, lab and other diagnostic tests. The patient's disease status was assessed based on evidence-based guidelines and found to be well controlled. MEDICATIONS were reviewed. SELF MANAGEMENT GOALS have been discussed and patient's success at attaining the goal of low cholesterol was assessed. RECOMMENDATION given include regular exercise 3 days a week and low cholesterol/low fat diet. CLINICAL SUMMARY including written plan to identify barriers unique to the patient due to social or economic  reasons was discussed.      Relevant Orders   Lipid panel   Gout attack - Primary    Patient has gout attack with diffuse swelling left arm that is resolving.  He has adhesive capsulitis left shoulder.  This is chronic condition.  We will inject at next visit.         No orders of the defined types were placed in this encounter.   Follow-up: Return in about 1 week (around 10/22/2019) for right shoulder injection.    Reinaldo Meeker, MD

## 2019-10-15 NOTE — Assessment & Plan Note (Signed)
Patient continues to have lymphedema and home health nurse continue to wrap 2 times a week both legs.

## 2019-10-15 NOTE — Assessment & Plan Note (Signed)

## 2019-10-15 NOTE — Assessment & Plan Note (Signed)
AN INDIVIDUAL CARE PLAN for kidney disease was established and reinforced today.  The patient's status was assessed using clinical findings on exam, labs, and other diagnostic testing. Patient's success at meeting treatment goals based on disease specific evidence-bassed guidelines and found to be in good control. RECOMMENDATIONS include maintaining present medicines and treatment.

## 2019-10-15 NOTE — Assessment & Plan Note (Signed)
.  An individual care plan for diabetes was established and reinforced today.  The patient's status was assessed using clinical findings on exam, labs and diagnostic testing. Patient success at meeting goals based on disease specific evidence-based guidelines and found to be fair controlled. Medications were assessed and patient's understanding of the medical issues , including barriers were assessed. Recommend adherence to a diabetic diet, a graduated exercise program, HgbA1c level is checked quarterly, and urine microalbumin performed yearly .  Annual mono-filament sensation testing performed. Lower blood pressure and control hyperlipidemia is important. Get annual eye exams and annual flu shots and smoking cessation discussed.  Self management goals were discussed. 

## 2019-10-15 NOTE — Assessment & Plan Note (Signed)
Patient has adhesive capsulitis left shoulder- recurrent.  I gave him exercises.  We will inject at next visit.

## 2019-10-15 NOTE — Assessment & Plan Note (Signed)
Patient is on chronic anticoagulation.  It presently is not therapeutic.  We will recheck in 2 weeks.

## 2019-10-15 NOTE — Assessment & Plan Note (Signed)
Patient not using CPAP any more.  We discussed the need but he refuses.

## 2019-10-15 NOTE — Assessment & Plan Note (Signed)
Patient has gout attack with diffuse swelling left arm that is resolving.  He has adhesive capsulitis left shoulder.  This is chronic condition.  We will inject at next visit.

## 2019-10-16 ENCOUNTER — Encounter: Payer: Self-pay | Admitting: Legal Medicine

## 2019-10-16 LAB — CBC WITH DIFFERENTIAL/PLATELET
Basophils Absolute: 0.1 10*3/uL (ref 0.0–0.2)
Basos: 1 %
EOS (ABSOLUTE): 0.1 10*3/uL (ref 0.0–0.4)
Eos: 2 %
Hematocrit: 34 % — ABNORMAL LOW (ref 37.5–51.0)
Hemoglobin: 10.8 g/dL — ABNORMAL LOW (ref 13.0–17.7)
Immature Grans (Abs): 0.1 10*3/uL (ref 0.0–0.1)
Immature Granulocytes: 1 %
Lymphocytes Absolute: 1.4 10*3/uL (ref 0.7–3.1)
Lymphs: 15 %
MCH: 28.5 pg (ref 26.6–33.0)
MCHC: 31.8 g/dL (ref 31.5–35.7)
MCV: 90 fL (ref 79–97)
Monocytes Absolute: 0.4 10*3/uL (ref 0.1–0.9)
Monocytes: 5 %
Neutrophils Absolute: 7 10*3/uL (ref 1.4–7.0)
Neutrophils: 76 %
Platelets: 274 10*3/uL (ref 150–450)
RBC: 3.79 x10E6/uL — ABNORMAL LOW (ref 4.14–5.80)
RDW: 12.4 % (ref 11.6–15.4)
WBC: 9.2 10*3/uL (ref 3.4–10.8)

## 2019-10-16 LAB — COMPREHENSIVE METABOLIC PANEL
ALT: 16 IU/L (ref 0–44)
AST: 17 IU/L (ref 0–40)
Albumin/Globulin Ratio: 1 — ABNORMAL LOW (ref 1.2–2.2)
Albumin: 3.7 g/dL — ABNORMAL LOW (ref 3.8–4.8)
Alkaline Phosphatase: 134 IU/L — ABNORMAL HIGH (ref 48–121)
BUN/Creatinine Ratio: 26 — ABNORMAL HIGH (ref 10–24)
BUN: 38 mg/dL — ABNORMAL HIGH (ref 8–27)
Bilirubin Total: <0.2 mg/dL (ref 0.0–1.2)
CO2: 22 mmol/L (ref 20–29)
Calcium: 9 mg/dL (ref 8.6–10.2)
Chloride: 97 mmol/L (ref 96–106)
Creatinine, Ser: 1.49 mg/dL — ABNORMAL HIGH (ref 0.76–1.27)
GFR calc Af Amer: 57 mL/min/{1.73_m2} — ABNORMAL LOW (ref >59)
GFR calc non Af Amer: 49 mL/min/{1.73_m2} — ABNORMAL LOW (ref >59)
Globulin, Total: 3.8 g/dL (ref 1.5–4.5)
Glucose: 289 mg/dL — ABNORMAL HIGH (ref 65–99)
Potassium: 5.1 mmol/L (ref 3.5–5.2)
Sodium: 135 mmol/L (ref 134–144)
Total Protein: 7.5 g/dL (ref 6.0–8.5)

## 2019-10-16 LAB — LIPID PANEL
Chol/HDL Ratio: 4.1 ratio (ref 0.0–5.0)
Cholesterol, Total: 140 mg/dL (ref 100–199)
HDL: 34 mg/dL — ABNORMAL LOW (ref >39)
LDL Chol Calc (NIH): 60 mg/dL (ref 0–99)
Triglycerides: 290 mg/dL — ABNORMAL HIGH (ref 0–149)
VLDL Cholesterol Cal: 46 mg/dL — ABNORMAL HIGH (ref 5–40)

## 2019-10-16 LAB — CARDIOVASCULAR RISK ASSESSMENT

## 2019-10-16 LAB — HEMOGLOBIN A1C
Est. average glucose Bld gHb Est-mCnc: 209 mg/dL
Hgb A1c MFr Bld: 8.9 % — ABNORMAL HIGH (ref 4.8–5.6)

## 2019-10-16 LAB — PROTIME-INR
INR: 1.9 — ABNORMAL HIGH (ref 0.9–1.2)
Prothrombin Time: 19.6 s — ABNORMAL HIGH (ref 9.1–12.0)

## 2019-10-16 NOTE — Progress Notes (Signed)
Chronic anemia, Glucose 289, kidney tests stable, low albumin, liver tests normal, triglycerides high at 290. A1c 8.9 we will continue to work on this, INR 1.9 ok now recheck one month lp

## 2019-10-17 ENCOUNTER — Ambulatory Visit: Payer: Self-pay

## 2019-10-17 NOTE — Progress Notes (Signed)
Matthew Gutierrez is currently on Coumadin for a diagnosis of DVT.  Current Labs: Hematology Lab Results  Component Value Date/Time   WBC 9.2 10/15/2019 10:14 AM   RBC 3.79 (L) 10/15/2019 10:14 AM   HGB 10.8 (L) 10/15/2019 10:14 AM   HCT 34.0 (L) 10/15/2019 10:14 AM   MCV 90 10/15/2019 10:14 AM   MCH 28.5 10/15/2019 10:14 AM   Platelets  Date Value Ref Range Status  10/15/2019 274 150 - 450 x10E3/uL Final   No results found for: PTT INR  Date Value Ref Range Status  10/15/2019 1.9 (H) 0.9 - 1.2 Final    Comment:    Reference interval is for non-anticoagulated patients. Suggested INR therapeutic range for Vitamin K antagonist therapy:    Standard Dose (moderate intensity                   therapeutic range):       2.0 - 3.0    Higher intensity therapeutic range       2.5 - 3.5     [name] is stable with regards to anticoagulation.  Warfarin will be started with a target INR of 1.5-2.5.  Will begin with dose of 4 mg daily and follow INR.  Reinaldo Meeker

## 2019-10-18 DIAGNOSIS — E1142 Type 2 diabetes mellitus with diabetic polyneuropathy: Secondary | ICD-10-CM | POA: Diagnosis not present

## 2019-10-18 DIAGNOSIS — E1151 Type 2 diabetes mellitus with diabetic peripheral angiopathy without gangrene: Secondary | ICD-10-CM | POA: Diagnosis not present

## 2019-10-18 DIAGNOSIS — I2510 Atherosclerotic heart disease of native coronary artery without angina pectoris: Secondary | ICD-10-CM | POA: Diagnosis not present

## 2019-10-18 DIAGNOSIS — E1122 Type 2 diabetes mellitus with diabetic chronic kidney disease: Secondary | ICD-10-CM | POA: Diagnosis not present

## 2019-10-18 DIAGNOSIS — I890 Lymphedema, not elsewhere classified: Secondary | ICD-10-CM | POA: Diagnosis not present

## 2019-10-18 DIAGNOSIS — I1310 Hypertensive heart and chronic kidney disease without heart failure, with stage 1 through stage 4 chronic kidney disease, or unspecified chronic kidney disease: Secondary | ICD-10-CM | POA: Diagnosis not present

## 2019-10-18 DIAGNOSIS — N1832 Chronic kidney disease, stage 3b: Secondary | ICD-10-CM | POA: Diagnosis not present

## 2019-10-18 DIAGNOSIS — I872 Venous insufficiency (chronic) (peripheral): Secondary | ICD-10-CM | POA: Diagnosis not present

## 2019-10-18 DIAGNOSIS — J449 Chronic obstructive pulmonary disease, unspecified: Secondary | ICD-10-CM | POA: Diagnosis not present

## 2019-10-21 DIAGNOSIS — E1122 Type 2 diabetes mellitus with diabetic chronic kidney disease: Secondary | ICD-10-CM | POA: Diagnosis not present

## 2019-10-21 DIAGNOSIS — E1151 Type 2 diabetes mellitus with diabetic peripheral angiopathy without gangrene: Secondary | ICD-10-CM | POA: Diagnosis not present

## 2019-10-21 DIAGNOSIS — E1142 Type 2 diabetes mellitus with diabetic polyneuropathy: Secondary | ICD-10-CM | POA: Diagnosis not present

## 2019-10-21 DIAGNOSIS — I890 Lymphedema, not elsewhere classified: Secondary | ICD-10-CM | POA: Diagnosis not present

## 2019-10-21 DIAGNOSIS — I1310 Hypertensive heart and chronic kidney disease without heart failure, with stage 1 through stage 4 chronic kidney disease, or unspecified chronic kidney disease: Secondary | ICD-10-CM | POA: Diagnosis not present

## 2019-10-21 DIAGNOSIS — J449 Chronic obstructive pulmonary disease, unspecified: Secondary | ICD-10-CM | POA: Diagnosis not present

## 2019-10-21 DIAGNOSIS — I872 Venous insufficiency (chronic) (peripheral): Secondary | ICD-10-CM | POA: Diagnosis not present

## 2019-10-21 DIAGNOSIS — N1832 Chronic kidney disease, stage 3b: Secondary | ICD-10-CM | POA: Diagnosis not present

## 2019-10-21 DIAGNOSIS — I2510 Atherosclerotic heart disease of native coronary artery without angina pectoris: Secondary | ICD-10-CM | POA: Diagnosis not present

## 2019-10-22 ENCOUNTER — Other Ambulatory Visit: Payer: Self-pay

## 2019-10-22 ENCOUNTER — Ambulatory Visit (INDEPENDENT_AMBULATORY_CARE_PROVIDER_SITE_OTHER): Payer: Medicare HMO | Admitting: Legal Medicine

## 2019-10-22 ENCOUNTER — Encounter: Payer: Self-pay | Admitting: Legal Medicine

## 2019-10-22 VITALS — BP 180/70 | HR 68 | Temp 97.4°F | Resp 16 | Ht 72.0 in | Wt 315.2 lb

## 2019-10-22 DIAGNOSIS — M7502 Adhesive capsulitis of left shoulder: Secondary | ICD-10-CM

## 2019-10-22 DIAGNOSIS — M7551 Bursitis of right shoulder: Secondary | ICD-10-CM

## 2019-10-22 DIAGNOSIS — M7552 Bursitis of left shoulder: Secondary | ICD-10-CM

## 2019-10-22 MED ORDER — TRIAMCINOLONE ACETONIDE 40 MG/ML IJ SUSP
80.0000 mg | Freq: Once | INTRAMUSCULAR | Status: AC
  Administered 2019-10-22: 80 mg via INTRA_ARTICULAR

## 2019-10-22 MED ORDER — OXYCODONE-ACETAMINOPHEN 10-325 MG PO TABS: 1.0000 | ORAL_TABLET | Freq: Four times a day (QID) | ORAL | 0 refills | Status: DC | PRN

## 2019-10-22 NOTE — Assessment & Plan Note (Signed)
Patient has left shoulder adhesive capsulitis for > 6 weeks.  It was injected and ROM exercises give, refill of oxycodone given for pain.

## 2019-10-22 NOTE — Progress Notes (Signed)
Established Patient Office Visit  Subjective:  Patient ID: Matthew Gutierrez, male    DOB: November 16, 1955  Age: 64 y.o. MRN: [id number]  CC:  Chief Complaint  Patient presents with  . Shoulder Pain    HPI Matthew Gutierrez presents for left shoulder pain for months.  He is unable to abduct well.  He has had pain for > 6 weeks in this shoulder.  Worse at night.  No injury.  Past Medical History:  Diagnosis Date  . Epilepsy, grand mal ([place])   . Frozen shoulder    Right Shoulder  . Lumbago   . Peripheral vascular disease ([place])   . Stroke [facility])     Past Surgical History:  Procedure Laterality Date  . CORONARY STENT PLACEMENT      Family History  Problem Relation Age of Onset  . Cancer Mother   . Stroke Father   . Hypertension Other   . Stroke Other   . Hyperlipidemia Other   . Alcohol abuse Other   . Heart disease Other   . Cancer Other     Social History   Socioeconomic History  . Marital status: Divorced    Spouse name: Not on file  . Number of children: 2  . Years of education: Not on file  . Highest education level: Not on file  Occupational History  . Occupation: Disability  Tobacco Use  . Smoking status: Former Smoker    Quit date: 10/28/2009    Years since quitting: 9.9  . Smokeless tobacco: Never Used  Substance and Sexual Activity  . Alcohol use: Yes    Comment: occassionally  . Drug use: No  . Sexual activity: Not Currently  Other Topics Concern  . Not on file  Social History Narrative  . Not on file   Social Determinants of Health   Financial Resource Strain:   . Difficulty of Paying Living Expenses:   Food Insecurity:   . Worried About Charity fundraiser in the Last Year:   . Arboriculturist in the Last Year:   Transportation Needs:   . Film/video editor (Medical):   Marland Kitchen Lack of Transportation (Non-Medical):   Physical Activity: Inactive  . Days of Exercise per Week: 0 days  . Minutes of Exercise per Session: 0 min  Stress:   . Feeling  of Stress :   Social Connections:   . Frequency of Communication with Friends and Family:   . Frequency of Social Gatherings with Friends and Family:   . Attends Religious Services:   . Active Member of Clubs or Organizations:   . Attends Archivist Meetings:   Marland Kitchen Marital Status:   Intimate Partner Violence: Not At Risk  . Fear of Current or Ex-Partner: No  . Emotionally Abused: No  . Physically Abused: No  . Sexually Abused: No    Outpatient Medications Prior to Visit  Medication Sig Dispense Refill  . allopurinol (ZYLOPRIM) 300 MG tablet Take 300 mg by mouth daily.    Marland Kitchen atenolol-chlorthalidone (TENORETIC) 100-25 MG tablet Take 1 tablet by mouth daily. 90 tablet 2  . clotrimazole-betamethasone (LOTRISONE) cream     . fesoterodine (TOVIAZ) 4 MG TB24 tablet Take 4 mg by mouth daily.    . furosemide (LASIX) 20 MG tablet Take 20 mg by mouth daily.    Marland Kitchen gabapentin (NEURONTIN) 300 MG capsule Take 300 mg by mouth at bedtime.    . hydrALAZINE (APRESOLINE) 10 MG tablet Take 2 tablets (20  mg total) by mouth 2 (two) times daily with a meal. 360 tablet 2  . lisinopril (ZESTRIL) 10 MG tablet Take 10 mg by mouth daily.    . metFORMIN (GLUCOPHAGE) 1000 MG tablet Take 1,000 mg by mouth 2 (two) times daily with a meal.    . MITIGARE 0.6 MG CAPS     . Omega-3 Fatty Acids (RA FISH OIL) 1000 MG CPDR Take by mouth.    . phenytoin (DILANTIN) 100 MG ER capsule Take 100 mg by mouth. Take three capsules twice daily    . pravastatin (PRAVACHOL) 40 MG tablet Take 40 mg by mouth daily.    . predniSONE (STERAPRED UNI-PAK 21 TAB) 10 MG (21) TBPK tablet Take 6ills first day , then 5 pills day 2 and then cut down one pill day until gone 21 tablet 0  . warfarin (COUMADIN) 3 MG tablet Take 1 tablet (3 mg total) by mouth daily. 30 tablet 0  . warfarin (COUMADIN) 4 MG tablet Take 1 tablet (4 mg total) by mouth daily. 90 tablet 2  . Wheat Dextrin (BENEFIBER) POWD Take by mouth. Mix 1 tablespoonful in a drink  once daily.    Marland Kitchen oxyCODONE-acetaminophen (PERCOCET) 10-325 MG tablet Take 1 tablet by mouth every 6 (six) hours as needed for pain. 30 tablet 0  . colchicine 0.6 MG tablet Take 0.6 mg by mouth daily.     No facility-administered medications prior to visit.    Allergies  Allergen Reactions  . Codeine     ROS Review of Systems  Constitutional: Negative.   HENT: Negative.   Eyes: Negative.   Respiratory: Negative.   Cardiovascular: Negative.   Endocrine: Negative.   Genitourinary: Negative.   Musculoskeletal: Positive for arthralgias (left shoulder).  Neurological: Negative.       Objective:    Physical Exam  Constitutional: He appears well-developed and well-nourished.  HENT:  Head: Normocephalic and atraumatic.  Right Ear: External ear normal.  Left Ear: External ear normal.  Nose: Nose normal.  Mouth/Throat: Oropharynx is clear and moist.  Cardiovascular: Normal rate, regular rhythm and intact distal pulses.  Pulmonary/Chest: Effort normal and breath sounds normal.  Musculoskeletal:     Left shoulder: Tenderness and pain present. Decreased range of motion.       Arms:     Comments: Adhesive capulitis left shoulder, abduction 60 degree Flexion 60 degrees ( passive) IR and ER 30 degrees, strength 3/5  Vitals reviewed.   BP (!) 180/70   Pulse 68   Temp (!) 97.4 F (36.3 C)   Resp 16   Ht 6' (1.829 m)   Wt (!) 315 lb 3.2 oz (143 kg)   SpO2 92%   BMI 42.75 kg/m  Wt Readings from Last 3 Encounters:  10/22/19 (!) 315 lb 3.2 oz (143 kg)  10/15/19 (!) 312 lb (141.5 kg)  07/25/19 (!) 324 lb 3.2 oz (147.1 kg)     Health Maintenance Due  Topic Date Due  . Hepatitis C Screening  Never done  . COVID-19 Vaccine (1) Never done  . TETANUS/TDAP  Never done  . OPHTHALMOLOGY EXAM  07/25/2019    There are no preventive care reminders to display for this patient.  No results found for: TSH Lab Results  Component Value Date   WBC 9.2 10/15/2019   HGB 10.8 (L)  10/15/2019   HCT 34.0 (L) 10/15/2019   MCV 90 10/15/2019   PLT 274 10/15/2019   Lab Results  Component Value Date  NA 135 10/15/2019   K 5.1 10/15/2019   CO2 22 10/15/2019   GLUCOSE 289 (H) 10/15/2019   BUN 38 (H) 10/15/2019   CREATININE 1.49 (H) 10/15/2019   BILITOT <0.2 10/15/2019   ALKPHOS 134 (H) 10/15/2019   AST 17 10/15/2019   ALT 16 10/15/2019   PROT 7.5 10/15/2019   ALBUMIN 3.7 (L) 10/15/2019   CALCIUM 9.0 10/15/2019   Lab Results  Component Value Date   CHOL 140 10/15/2019   Lab Results  Component Value Date   HDL 34 (L) 10/15/2019   Lab Results  Component Value Date   LDLCALC 60 10/15/2019   Lab Results  Component Value Date   TRIG 290 (H) 10/15/2019   Lab Results  Component Value Date   CHOLHDL 4.1 10/15/2019   Lab Results  Component Value Date   HGBA1C 8.9 (H) 10/15/2019      Assessment & Plan:   Problem List Items Addressed This Visit      Musculoskeletal and Integument   Shoulder bursitis    Patient has left shoulder adhesive capsulitis for > 6 weeks.  It was injected and ROM exercises give, refill of oxycodone given for pain.      Relevant Medications   oxyCODONE-acetaminophen (PERCOCET) 10-325 MG tablet    Other Visit Diagnoses    Adhesive capsulitis of left shoulder    -  Primary   Relevant Medications   triamcinolone acetonide (KENALOG-40) injection 80 mg (Completed)   oxyCODONE-acetaminophen (PERCOCET) 10-325 MG tablet    After consent was obtained, using sterile technique the left shoulder was prepped and plain Ethyl Chloride was used as local anesthetic. The joint was entered and 0 ml's of cleaar colored fluid was withdrawn.  Steroid 80 mg and 3 ml plain Lidocaine was then injected and the needle withdrawn.  The procedure was well tolerated.  The patient is asked to continue to rest the joint for a few more days before resuming regular activities.  It may be more painful for the first 1-2 days.  Watch for fever, or increased  swelling or persistent pain in the joint. Call or return to clinic prn if such symptoms occur or there is failure to improve as anticipated.  Meds ordered this encounter  Medications  . triamcinolone acetonide (KENALOG-40) injection 80 mg  . oxyCODONE-acetaminophen (PERCOCET) 10-325 MG tablet    Sig: Take 1 tablet by mouth every 6 (six) hours as needed for pain.    Dispense:  30 tablet    Refill:  0    Follow-up: Return in about 2 weeks (around 11/05/2019).    Reinaldo Meeker, MD

## 2019-10-22 NOTE — Patient Instructions (Signed)
Shoulder Exercises Ask your health care provider which exercises are safe for you. Do exercises exactly as told by your health care provider and adjust them as directed. It is normal to feel mild stretching, pulling, tightness, or discomfort as you do these exercises. Stop right away if you feel sudden pain or your pain gets worse. Do not begin these exercises until told by your health care provider. Stretching exercises External rotation and abduction This exercise is sometimes called corner stretch. This exercise rotates your arm outward (external rotation) and moves your arm out from your body (abduction). 1. Stand in a doorway with one of your feet slightly in front of the other. This is called a staggered stance. If you cannot reach your forearms to the door frame, stand facing a corner of a room. 2. Choose one of the following positions as told by your health care provider: ? Place your hands and forearms on the door frame above your head. ? Place your hands and forearms on the door frame at the height of your head. ? Place your hands on the door frame at the height of your elbows. 3. Slowly move your weight onto your front foot until you feel a stretch across your chest and in the front of your shoulders. Keep your head and chest upright and keep your abdominal muscles tight. 4. Hold for __________ seconds. 5. To release the stretch, shift your weight to your back foot. Repeat __________ times. Complete this exercise __________ times a day. Extension, standing 1. Stand and hold a broomstick, a cane, or a similar object behind your back. ? Your hands should be a little wider than shoulder width apart. ? Your palms should face away from your back. 2. Keeping your elbows straight and your shoulder muscles relaxed, move the stick away from your body until you feel a stretch in your shoulders (extension). ? Avoid shrugging your shoulders while you move the stick. Keep your shoulder blades tucked  down toward the middle of your back. 3. Hold for __________ seconds. 4. Slowly return to the starting position. Repeat __________ times. Complete this exercise __________ times a day. Range-of-motion exercises Pendulum  1. Stand near a wall or a surface that you can hold onto for balance. 2. Bend at the waist and let your left / right arm hang straight down. Use your other arm to support you. Keep your back straight and do not lock your knees. 3. Relax your left / right arm and shoulder muscles, and move your hips and your trunk so your left / right arm swings freely. Your arm should swing because of the motion of your body, not because you are using your arm or shoulder muscles. 4. Keep moving your hips and trunk so your arm swings in the following directions, as told by your health care provider: ? Side to side. ? Forward and backward. ? In clockwise and counterclockwise circles. 5. Continue each motion for __________ seconds, or for as long as told by your health care provider. 6. Slowly return to the starting position. Repeat __________ times. Complete this exercise __________ times a day. Shoulder flexion, standing  1. Stand and hold a broomstick, a cane, or a similar object. Place your hands a little more than shoulder width apart on the object. Your left / right hand should be palm up, and your other hand should be palm down. 2. Keep your elbow straight and your shoulder muscles relaxed. Push the stick up with your healthy arm to   raise your left / right arm in front of your body, and then over your head until you feel a stretch in your shoulder (flexion). ? Avoid shrugging your shoulder while you raise your arm. Keep your shoulder blade tucked down toward the middle of your back. 3. Hold for __________ seconds. 4. Slowly return to the starting position. Repeat __________ times. Complete this exercise __________ times a day. Shoulder abduction, standing 1. Stand and hold a broomstick,  a cane, or a similar object. Place your hands a little more than shoulder width apart on the object. Your left / right hand should be palm up, and your other hand should be palm down. 2. Keep your elbow straight and your shoulder muscles relaxed. Push the object across your body toward your left / right side. Raise your left / right arm to the side of your body (abduction) until you feel a stretch in your shoulder. ? Do not raise your arm above shoulder height unless your health care provider tells you to do that. ? If directed, raise your arm over your head. ? Avoid shrugging your shoulder while you raise your arm. Keep your shoulder blade tucked down toward the middle of your back. 3. Hold for __________ seconds. 4. Slowly return to the starting position. Repeat __________ times. Complete this exercise __________ times a day. Internal rotation  1. Place your left / right hand behind your back, palm up. 2. Use your other hand to dangle an exercise band, a towel, or a similar object over your shoulder. Grasp the band with your left / right hand so you are holding on to both ends. 3. Gently pull up on the band until you feel a stretch in the front of your left / right shoulder. The movement of your arm toward the center of your body is called internal rotation. ? Avoid shrugging your shoulder while you raise your arm. Keep your shoulder blade tucked down toward the middle of your back. 4. Hold for __________ seconds. 5. Release the stretch by letting go of the band and lowering your hands. Repeat __________ times. Complete this exercise __________ times a day. Strengthening exercises External rotation  1. Sit in a stable chair without armrests. 2. Secure an exercise band to a stable object at elbow height on your left / right side. 3. Place a soft object, such as a folded towel or a small pillow, between your left / right upper arm and your body to move your elbow about 4 inches (10 cm) away  from your side. 4. Hold the end of the exercise band so it is tight and there is no slack. 5. Keeping your elbow pressed against the soft object, slowly move your forearm out, away from your abdomen (external rotation). Keep your body steady so only your forearm moves. 6. Hold for __________ seconds. 7. Slowly return to the starting position. Repeat __________ times. Complete this exercise __________ times a day. Shoulder abduction  1. Sit in a stable chair without armrests, or stand up. 2. Hold a __________ weight in your left / right hand, or hold an exercise band with both hands. 3. Start with your arms straight down and your left / right palm facing in, toward your body. 4. Slowly lift your left / right hand out to your side (abduction). Do not lift your hand above shoulder height unless your health care provider tells you that this is safe. ? Keep your arms straight. ? Avoid shrugging your shoulder while you   do this movement. Keep your shoulder blade tucked down toward the middle of your back. 5. Hold for __________ seconds. 6. Slowly lower your arm, and return to the starting position. Repeat __________ times. Complete this exercise __________ times a day. Shoulder extension 1. Sit in a stable chair without armrests, or stand up. 2. Secure an exercise band to a stable object in front of you so it is at shoulder height. 3. Hold one end of the exercise band in each hand. Your palms should face each other. 4. Straighten your elbows and lift your hands up to shoulder height. 5. Step back, away from the secured end of the exercise band, until the band is tight and there is no slack. 6. Squeeze your shoulder blades together as you pull your hands down to the sides of your thighs (extension). Stop when your hands are straight down by your sides. Do not let your hands go behind your body. 7. Hold for __________ seconds. 8. Slowly return to the starting position. Repeat __________ times.  Complete this exercise __________ times a day. Shoulder row 1. Sit in a stable chair without armrests, or stand up. 2. Secure an exercise band to a stable object in front of you so it is at waist height. 3. Hold one end of the exercise band in each hand. Position your palms so that your thumbs are facing the ceiling (neutral position). 4. Bend each of your elbows to a 90-degree angle (right angle) and keep your upper arms at your sides. 5. Step back until the band is tight and there is no slack. 6. Slowly pull your elbows back behind you. 7. Hold for __________ seconds. 8. Slowly return to the starting position. Repeat __________ times. Complete this exercise __________ times a day. Shoulder press-ups  1. Sit in a stable chair that has armrests. Sit upright, with your feet flat on the floor. 2. Put your hands on the armrests so your elbows are bent and your fingers are pointing forward. Your hands should be about even with the sides of your body. 3. Push down on the armrests and use your arms to lift yourself off the chair. Straighten your elbows and lift yourself up as much as you comfortably can. ? Move your shoulder blades down, and avoid letting your shoulders move up toward your ears. ? Keep your feet on the ground. As you get stronger, your feet should support less of your body weight as you lift yourself up. 4. Hold for __________ seconds. 5. Slowly lower yourself back into the chair. Repeat __________ times. Complete this exercise __________ times a day. Wall push-ups  1. Stand so you are facing a stable wall. Your feet should be about one arm-length away from the wall. 2. Lean forward and place your palms on the wall at shoulder height. 3. Keep your feet flat on the floor as you bend your elbows and lean forward toward the wall. 4. Hold for __________ seconds. 5. Straighten your elbows to push yourself back to the starting position. Repeat __________ times. Complete this exercise  __________ times a day. This information is not intended to replace advice given to you by your health care provider. Make sure you discuss any questions you have with your health care provider. Document Revised: 09/07/2018 Document Reviewed: 06/15/2018 [employer] Patient Education  2020 [employer]  

## 2019-10-23 DIAGNOSIS — E1122 Type 2 diabetes mellitus with diabetic chronic kidney disease: Secondary | ICD-10-CM | POA: Diagnosis not present

## 2019-10-23 DIAGNOSIS — I1310 Hypertensive heart and chronic kidney disease without heart failure, with stage 1 through stage 4 chronic kidney disease, or unspecified chronic kidney disease: Secondary | ICD-10-CM | POA: Diagnosis not present

## 2019-10-23 DIAGNOSIS — E1151 Type 2 diabetes mellitus with diabetic peripheral angiopathy without gangrene: Secondary | ICD-10-CM | POA: Diagnosis not present

## 2019-10-23 DIAGNOSIS — I2510 Atherosclerotic heart disease of native coronary artery without angina pectoris: Secondary | ICD-10-CM | POA: Diagnosis not present

## 2019-10-23 DIAGNOSIS — J449 Chronic obstructive pulmonary disease, unspecified: Secondary | ICD-10-CM | POA: Diagnosis not present

## 2019-10-23 DIAGNOSIS — N1832 Chronic kidney disease, stage 3b: Secondary | ICD-10-CM | POA: Diagnosis not present

## 2019-10-23 DIAGNOSIS — I890 Lymphedema, not elsewhere classified: Secondary | ICD-10-CM | POA: Diagnosis not present

## 2019-10-23 DIAGNOSIS — E1142 Type 2 diabetes mellitus with diabetic polyneuropathy: Secondary | ICD-10-CM | POA: Diagnosis not present

## 2019-10-23 DIAGNOSIS — I872 Venous insufficiency (chronic) (peripheral): Secondary | ICD-10-CM | POA: Diagnosis not present

## 2019-10-25 DIAGNOSIS — E1151 Type 2 diabetes mellitus with diabetic peripheral angiopathy without gangrene: Secondary | ICD-10-CM | POA: Diagnosis not present

## 2019-10-25 DIAGNOSIS — I2510 Atherosclerotic heart disease of native coronary artery without angina pectoris: Secondary | ICD-10-CM | POA: Diagnosis not present

## 2019-10-25 DIAGNOSIS — I890 Lymphedema, not elsewhere classified: Secondary | ICD-10-CM | POA: Diagnosis not present

## 2019-10-25 DIAGNOSIS — I1310 Hypertensive heart and chronic kidney disease without heart failure, with stage 1 through stage 4 chronic kidney disease, or unspecified chronic kidney disease: Secondary | ICD-10-CM | POA: Diagnosis not present

## 2019-10-25 DIAGNOSIS — I872 Venous insufficiency (chronic) (peripheral): Secondary | ICD-10-CM | POA: Diagnosis not present

## 2019-10-25 DIAGNOSIS — E1142 Type 2 diabetes mellitus with diabetic polyneuropathy: Secondary | ICD-10-CM | POA: Diagnosis not present

## 2019-10-25 DIAGNOSIS — J449 Chronic obstructive pulmonary disease, unspecified: Secondary | ICD-10-CM | POA: Diagnosis not present

## 2019-10-25 DIAGNOSIS — E1122 Type 2 diabetes mellitus with diabetic chronic kidney disease: Secondary | ICD-10-CM | POA: Diagnosis not present

## 2019-10-25 DIAGNOSIS — N1832 Chronic kidney disease, stage 3b: Secondary | ICD-10-CM | POA: Diagnosis not present

## 2019-10-29 DIAGNOSIS — I872 Venous insufficiency (chronic) (peripheral): Secondary | ICD-10-CM | POA: Diagnosis not present

## 2019-10-29 DIAGNOSIS — I1310 Hypertensive heart and chronic kidney disease without heart failure, with stage 1 through stage 4 chronic kidney disease, or unspecified chronic kidney disease: Secondary | ICD-10-CM | POA: Diagnosis not present

## 2019-10-29 DIAGNOSIS — I2510 Atherosclerotic heart disease of native coronary artery without angina pectoris: Secondary | ICD-10-CM | POA: Diagnosis not present

## 2019-10-29 DIAGNOSIS — J449 Chronic obstructive pulmonary disease, unspecified: Secondary | ICD-10-CM | POA: Diagnosis not present

## 2019-10-29 DIAGNOSIS — E1142 Type 2 diabetes mellitus with diabetic polyneuropathy: Secondary | ICD-10-CM | POA: Diagnosis not present

## 2019-10-29 DIAGNOSIS — N1832 Chronic kidney disease, stage 3b: Secondary | ICD-10-CM | POA: Diagnosis not present

## 2019-10-29 DIAGNOSIS — I890 Lymphedema, not elsewhere classified: Secondary | ICD-10-CM | POA: Diagnosis not present

## 2019-10-29 DIAGNOSIS — E1151 Type 2 diabetes mellitus with diabetic peripheral angiopathy without gangrene: Secondary | ICD-10-CM | POA: Diagnosis not present

## 2019-10-29 DIAGNOSIS — E1122 Type 2 diabetes mellitus with diabetic chronic kidney disease: Secondary | ICD-10-CM | POA: Diagnosis not present

## 2019-10-30 ENCOUNTER — Ambulatory Visit (INDEPENDENT_AMBULATORY_CARE_PROVIDER_SITE_OTHER): Payer: Medicare HMO | Admitting: Legal Medicine

## 2019-10-30 ENCOUNTER — Other Ambulatory Visit: Payer: Self-pay

## 2019-10-30 ENCOUNTER — Encounter: Payer: Self-pay | Admitting: Legal Medicine

## 2019-10-30 VITALS — BP 150/90 | HR 80 | Temp 97.8°F | Resp 18 | Ht 72.0 in | Wt 315.0 lb

## 2019-10-30 DIAGNOSIS — M7552 Bursitis of left shoulder: Secondary | ICD-10-CM | POA: Diagnosis not present

## 2019-10-30 MED ORDER — FLUTICASONE PROPIONATE 50 MCG/ACT NA SUSP: 1.0000 | Freq: Every day | NASAL | 2 refills | Status: DC

## 2019-10-30 MED ORDER — TRIAMCINOLONE ACETONIDE 40 MG/ML IJ SUSP
80.0000 mg | Freq: Once | INTRAMUSCULAR | Status: DC
Start: 2019-10-30 — End: 2019-11-15

## 2019-10-30 NOTE — Progress Notes (Signed)
Acute Office Visit  Subjective:    Patient ID: Matthew Gutierrez, male    DOB: 1955/08/07, 64 y.o.   MRN: [id number]  Chief Complaint  Patient presents with  . Shoulder Pain    left    HPI Patient is in today for severe left shoulder pain.  He is uing bathroom a lot.  He was recently admitted for swollen left arm and found to have gout and this resolved with gout medicines.  He restarted gout medicine today.  He is unable to lift arm.  Past Medical History:  Diagnosis Date  . Epilepsy, grand mal ([place])   . Frozen shoulder    Right Shoulder  . Lumbago   . Peripheral vascular disease ([place])   . Stroke [facility])     Past Surgical History:  Procedure Laterality Date  . CORONARY STENT PLACEMENT      Family History  Problem Relation Age of Onset  . Cancer Mother   . Stroke Father   . Hypertension Other   . Stroke Other   . Hyperlipidemia Other   . Alcohol abuse Other   . Heart disease Other   . Cancer Other     Social History   Socioeconomic History  . Marital status: Divorced    Spouse name: Not on file  . Number of children: 2  . Years of education: Not on file  . Highest education level: Not on file  Occupational History  . Occupation: Disability  Tobacco Use  . Smoking status: Former Smoker    Quit date: 10/28/2009    Years since quitting: 10.0  . Smokeless tobacco: Never Used  Substance and Sexual Activity  . Alcohol use: Yes    Comment: occassionally  . Drug use: No  . Sexual activity: Not Currently  Other Topics Concern  . Not on file  Social History Narrative  . Not on file   Social Determinants of Health   Financial Resource Strain:   . Difficulty of Paying Living Expenses:   Food Insecurity:   . Worried About Charity fundraiser in the Last Year:   . Arboriculturist in the Last Year:   Transportation Needs:   . Film/video editor (Medical):   Marland Kitchen Lack of Transportation (Non-Medical):   Physical Activity: Inactive  . Days of Exercise per Week:  0 days  . Minutes of Exercise per Session: 0 min  Stress:   . Feeling of Stress :   Social Connections:   . Frequency of Communication with Friends and Family:   . Frequency of Social Gatherings with Friends and Family:   . Attends Religious Services:   . Active Member of Clubs or Organizations:   . Attends Archivist Meetings:   Marland Kitchen Marital Status:   Intimate Partner Violence: Not At Risk  . Fear of Current or Ex-Partner: No  . Emotionally Abused: No  . Physically Abused: No  . Sexually Abused: No    Outpatient Medications Prior to Visit  Medication Sig Dispense Refill  . allopurinol (ZYLOPRIM) 300 MG tablet Take 300 mg by mouth daily.    Marland Kitchen atenolol-chlorthalidone (TENORETIC) 100-25 MG tablet Take 1 tablet by mouth daily. 90 tablet 2  . clotrimazole-betamethasone (LOTRISONE) cream     . fesoterodine (TOVIAZ) 4 MG TB24 tablet Take 4 mg by mouth daily.    . furosemide (LASIX) 20 MG tablet Take 20 mg by mouth daily.    Marland Kitchen gabapentin (NEURONTIN) 300 MG capsule Take 300 mg  by mouth at bedtime.    . hydrALAZINE (APRESOLINE) 10 MG tablet Take 2 tablets (20 mg total) by mouth 2 (two) times daily with a meal. 360 tablet 2  . lisinopril (ZESTRIL) 10 MG tablet Take 10 mg by mouth daily.    . metFORMIN (GLUCOPHAGE) 1000 MG tablet Take 1,000 mg by mouth 2 (two) times daily with a meal.    . MITIGARE 0.6 MG CAPS     . Omega-3 Fatty Acids (RA FISH OIL) 1000 MG CPDR Take by mouth.    . oxyCODONE-acetaminophen (PERCOCET) 10-325 MG tablet Take 1 tablet by mouth every 6 (six) hours as needed for pain. 30 tablet 0  . phenytoin (DILANTIN) 100 MG ER capsule Take 100 mg by mouth. Take three capsules twice daily    . pravastatin (PRAVACHOL) 40 MG tablet Take 40 mg by mouth daily.    . predniSONE (STERAPRED UNI-PAK 21 TAB) 10 MG (21) TBPK tablet Take 6ills first day , then 5 pills day 2 and then cut down one pill day until gone 21 tablet 0  . warfarin (COUMADIN) 3 MG tablet Take 1 tablet (3 mg  total) by mouth daily. 30 tablet 0  . warfarin (COUMADIN) 4 MG tablet Take 1 tablet (4 mg total) by mouth daily. 90 tablet 2  . Wheat Dextrin (BENEFIBER) POWD Take by mouth. Mix 1 tablespoonful in a drink once daily.    . fluticasone (FLONASE) 50 MCG/ACT nasal spray Place 1 spray into both nostrils daily.    . colchicine 0.6 MG tablet Take 0.6 mg by mouth daily.     No facility-administered medications prior to visit.    Allergies  Allergen Reactions  . Codeine     Review of Systems  Constitutional: Negative.   HENT: Negative.   Eyes: Negative.   Respiratory: Negative.   Cardiovascular: Negative.   Gastrointestinal: Negative.   Genitourinary: Negative.   Musculoskeletal: Positive for arthralgias.  Neurological: Negative.        Objective:    Physical Exam Vitals reviewed.  Constitutional:      Appearance: Normal appearance.  Cardiovascular:     Rate and Rhythm: Normal rate and regular rhythm.     Pulses: Normal pulses.     Heart sounds: Normal heart sounds.  Pulmonary:     Effort: Pulmonary effort is normal.     Breath sounds: Normal breath sounds.  Musculoskeletal:     Right shoulder: Normal.     Left shoulder: Swelling present. Decreased range of motion.       Arms:     Comments: Pain left shoulder, abduction to 45 degrees, flexion to 45 degrees.  IR and ER about 30 degrees.  Minimal swelling of arm and hand.  Neurological:     Mental Status: He is alert.     BP (!) 150/90   Pulse 80   Temp 97.8 F (36.6 C)   Resp 18   Ht 6' (1.829 m)   Wt (!) 315 lb (142.9 kg)   SpO2 92%   BMI 42.72 kg/m  Wt Readings from Last 3 Encounters:  10/30/19 (!) 315 lb (142.9 kg)  10/22/19 (!) 315 lb 3.2 oz (143 kg)  10/15/19 (!) 312 lb (141.5 kg)    Health Maintenance Due  Topic Date Due  . Hepatitis C Screening  Never done  . COVID-19 Vaccine (1) Never done  . TETANUS/TDAP  Never done  . OPHTHALMOLOGY EXAM  07/25/2019    There are no preventive care reminders to  display for this patient.   No results found for: TSH Lab Results  Component Value Date   WBC 9.2 10/15/2019   HGB 10.8 (L) 10/15/2019   HCT 34.0 (L) 10/15/2019   MCV 90 10/15/2019   PLT 274 10/15/2019   Lab Results  Component Value Date   NA 135 10/15/2019   K 5.1 10/15/2019   CO2 22 10/15/2019   GLUCOSE 289 (H) 10/15/2019   BUN 38 (H) 10/15/2019   CREATININE 1.49 (H) 10/15/2019   BILITOT <0.2 10/15/2019   ALKPHOS 134 (H) 10/15/2019   AST 17 10/15/2019   ALT 16 10/15/2019   PROT 7.5 10/15/2019   ALBUMIN 3.7 (L) 10/15/2019   CALCIUM 9.0 10/15/2019   Lab Results  Component Value Date   CHOL 140 10/15/2019   Lab Results  Component Value Date   HDL 34 (L) 10/15/2019   Lab Results  Component Value Date   LDLCALC 60 10/15/2019   Lab Results  Component Value Date   TRIG 290 (H) 10/15/2019   Lab Results  Component Value Date   CHOLHDL 4.1 10/15/2019   Lab Results  Component Value Date   HGBA1C 8.9 (H) 10/15/2019       Assessment & Plan:   Problem List Items Addressed This Visit    None    left shoulder bursitis:  Patient has recurrent left shoulder pain.  He has limited movement of shoulder and is treated as a bursitis, little arm swelling but he remains on gout medicines. arthralgias  After consent was obtained, using sterile technique the left shoulder was prepped and plain Ethyl chloride was used as local anesthetic. The joint was entered and Steroid 80 mg and 3 ml plain Lidocaine was then injected and the needle withdrawn.  The procedure was well tolerated.  The patient is asked to continue to rest the joint for a few more days before resuming regular activities.  It may be more painful for the first 1-2 days.  Watch for fever, or increased swelling or persistent pain in the joint. Call or return to clinic prn if such symptoms occur or there is failure to improve as anticipated.  No orders of the defined types were placed in this encounter.  Return in  about 3 weeks (around 11/20/2019) for for shoulder follow up.   Reinaldo Meeker, MD

## 2019-10-31 DIAGNOSIS — J449 Chronic obstructive pulmonary disease, unspecified: Secondary | ICD-10-CM | POA: Diagnosis not present

## 2019-10-31 DIAGNOSIS — I872 Venous insufficiency (chronic) (peripheral): Secondary | ICD-10-CM | POA: Diagnosis not present

## 2019-10-31 DIAGNOSIS — E1122 Type 2 diabetes mellitus with diabetic chronic kidney disease: Secondary | ICD-10-CM | POA: Diagnosis not present

## 2019-10-31 DIAGNOSIS — E1151 Type 2 diabetes mellitus with diabetic peripheral angiopathy without gangrene: Secondary | ICD-10-CM | POA: Diagnosis not present

## 2019-10-31 DIAGNOSIS — I2510 Atherosclerotic heart disease of native coronary artery without angina pectoris: Secondary | ICD-10-CM | POA: Diagnosis not present

## 2019-10-31 DIAGNOSIS — I1310 Hypertensive heart and chronic kidney disease without heart failure, with stage 1 through stage 4 chronic kidney disease, or unspecified chronic kidney disease: Secondary | ICD-10-CM | POA: Diagnosis not present

## 2019-10-31 DIAGNOSIS — N1832 Chronic kidney disease, stage 3b: Secondary | ICD-10-CM | POA: Diagnosis not present

## 2019-10-31 DIAGNOSIS — I890 Lymphedema, not elsewhere classified: Secondary | ICD-10-CM | POA: Diagnosis not present

## 2019-10-31 DIAGNOSIS — E1142 Type 2 diabetes mellitus with diabetic polyneuropathy: Secondary | ICD-10-CM | POA: Diagnosis not present

## 2019-11-01 DIAGNOSIS — I2510 Atherosclerotic heart disease of native coronary artery without angina pectoris: Secondary | ICD-10-CM | POA: Diagnosis not present

## 2019-11-01 DIAGNOSIS — I872 Venous insufficiency (chronic) (peripheral): Secondary | ICD-10-CM | POA: Diagnosis not present

## 2019-11-01 DIAGNOSIS — I1310 Hypertensive heart and chronic kidney disease without heart failure, with stage 1 through stage 4 chronic kidney disease, or unspecified chronic kidney disease: Secondary | ICD-10-CM | POA: Diagnosis not present

## 2019-11-01 DIAGNOSIS — N1832 Chronic kidney disease, stage 3b: Secondary | ICD-10-CM | POA: Diagnosis not present

## 2019-11-01 DIAGNOSIS — E1122 Type 2 diabetes mellitus with diabetic chronic kidney disease: Secondary | ICD-10-CM | POA: Diagnosis not present

## 2019-11-01 DIAGNOSIS — E1142 Type 2 diabetes mellitus with diabetic polyneuropathy: Secondary | ICD-10-CM | POA: Diagnosis not present

## 2019-11-01 DIAGNOSIS — E1151 Type 2 diabetes mellitus with diabetic peripheral angiopathy without gangrene: Secondary | ICD-10-CM | POA: Diagnosis not present

## 2019-11-01 DIAGNOSIS — J449 Chronic obstructive pulmonary disease, unspecified: Secondary | ICD-10-CM | POA: Diagnosis not present

## 2019-11-01 DIAGNOSIS — I890 Lymphedema, not elsewhere classified: Secondary | ICD-10-CM | POA: Diagnosis not present

## 2019-11-04 DIAGNOSIS — I1310 Hypertensive heart and chronic kidney disease without heart failure, with stage 1 through stage 4 chronic kidney disease, or unspecified chronic kidney disease: Secondary | ICD-10-CM | POA: Diagnosis not present

## 2019-11-04 DIAGNOSIS — E1122 Type 2 diabetes mellitus with diabetic chronic kidney disease: Secondary | ICD-10-CM | POA: Diagnosis not present

## 2019-11-04 DIAGNOSIS — E1142 Type 2 diabetes mellitus with diabetic polyneuropathy: Secondary | ICD-10-CM | POA: Diagnosis not present

## 2019-11-04 DIAGNOSIS — N1832 Chronic kidney disease, stage 3b: Secondary | ICD-10-CM | POA: Diagnosis not present

## 2019-11-04 DIAGNOSIS — I872 Venous insufficiency (chronic) (peripheral): Secondary | ICD-10-CM | POA: Diagnosis not present

## 2019-11-04 DIAGNOSIS — E1151 Type 2 diabetes mellitus with diabetic peripheral angiopathy without gangrene: Secondary | ICD-10-CM | POA: Diagnosis not present

## 2019-11-04 DIAGNOSIS — I890 Lymphedema, not elsewhere classified: Secondary | ICD-10-CM | POA: Diagnosis not present

## 2019-11-04 DIAGNOSIS — J449 Chronic obstructive pulmonary disease, unspecified: Secondary | ICD-10-CM | POA: Diagnosis not present

## 2019-11-04 DIAGNOSIS — I2510 Atherosclerotic heart disease of native coronary artery without angina pectoris: Secondary | ICD-10-CM | POA: Diagnosis not present

## 2019-11-06 DIAGNOSIS — I890 Lymphedema, not elsewhere classified: Secondary | ICD-10-CM | POA: Diagnosis not present

## 2019-11-06 DIAGNOSIS — I2510 Atherosclerotic heart disease of native coronary artery without angina pectoris: Secondary | ICD-10-CM | POA: Diagnosis not present

## 2019-11-06 DIAGNOSIS — I872 Venous insufficiency (chronic) (peripheral): Secondary | ICD-10-CM | POA: Diagnosis not present

## 2019-11-06 DIAGNOSIS — N1832 Chronic kidney disease, stage 3b: Secondary | ICD-10-CM | POA: Diagnosis not present

## 2019-11-06 DIAGNOSIS — I1310 Hypertensive heart and chronic kidney disease without heart failure, with stage 1 through stage 4 chronic kidney disease, or unspecified chronic kidney disease: Secondary | ICD-10-CM | POA: Diagnosis not present

## 2019-11-06 DIAGNOSIS — E1151 Type 2 diabetes mellitus with diabetic peripheral angiopathy without gangrene: Secondary | ICD-10-CM | POA: Diagnosis not present

## 2019-11-06 DIAGNOSIS — J449 Chronic obstructive pulmonary disease, unspecified: Secondary | ICD-10-CM | POA: Diagnosis not present

## 2019-11-06 DIAGNOSIS — E1142 Type 2 diabetes mellitus with diabetic polyneuropathy: Secondary | ICD-10-CM | POA: Diagnosis not present

## 2019-11-06 DIAGNOSIS — E1122 Type 2 diabetes mellitus with diabetic chronic kidney disease: Secondary | ICD-10-CM | POA: Diagnosis not present

## 2019-11-07 DIAGNOSIS — E1122 Type 2 diabetes mellitus with diabetic chronic kidney disease: Secondary | ICD-10-CM | POA: Diagnosis not present

## 2019-11-07 DIAGNOSIS — J449 Chronic obstructive pulmonary disease, unspecified: Secondary | ICD-10-CM | POA: Diagnosis not present

## 2019-11-07 DIAGNOSIS — I2510 Atherosclerotic heart disease of native coronary artery without angina pectoris: Secondary | ICD-10-CM | POA: Diagnosis not present

## 2019-11-07 DIAGNOSIS — I1310 Hypertensive heart and chronic kidney disease without heart failure, with stage 1 through stage 4 chronic kidney disease, or unspecified chronic kidney disease: Secondary | ICD-10-CM | POA: Diagnosis not present

## 2019-11-07 DIAGNOSIS — I872 Venous insufficiency (chronic) (peripheral): Secondary | ICD-10-CM | POA: Diagnosis not present

## 2019-11-07 DIAGNOSIS — N1832 Chronic kidney disease, stage 3b: Secondary | ICD-10-CM | POA: Diagnosis not present

## 2019-11-07 DIAGNOSIS — I890 Lymphedema, not elsewhere classified: Secondary | ICD-10-CM | POA: Diagnosis not present

## 2019-11-07 DIAGNOSIS — E1142 Type 2 diabetes mellitus with diabetic polyneuropathy: Secondary | ICD-10-CM | POA: Diagnosis not present

## 2019-11-07 DIAGNOSIS — E1151 Type 2 diabetes mellitus with diabetic peripheral angiopathy without gangrene: Secondary | ICD-10-CM | POA: Diagnosis not present

## 2019-11-07 NOTE — Chronic Care Management (AMB) (Signed)
Chronic Care Management Pharmacy  Name: Matthew Gutierrez  MRN: [id number] DOB: 1955/08/27  Chief Complaint/ HPI    Matthew Gutierrez,  64 y.o. , male presents for their Follow-Up CCM visit with the clinical pharmacist via telephone due to COVID-19 Pandemic.  PCP : Lillard Anes, MD  Their chronic conditions include: HTN, COPD, Sleep Apnea, Atherosclerotic heart disease, DM, CKD, Hx of DVT/Stroke and bowel/urinary Incontinence.   Office Visits: 10/30/2019 - shoulder steroid injection given. Patient could not raise shoulder.   10/22/2019 - kenalog injection and percocet for adhesive capsulitis.   10/15/2019 - Chronic anemia, Glucose 289, kidney tests stable, low albumin, liver tests normal, triglycerides high at 290. A1c 8.9 we will continue to work on this, INR 1.9. 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 Recent gout sent him to the hospital.   10/04/2019 - pred pack 6 day.   07/25/2019 - Dr. Henrene Pastor incontinence of bladder and stool. Stop all bowel laxatives and magnesium. Referral to GI and Urology.   06/07/2019 - Patient seen for chronic disease visit. Has foot ulcer. Labs checked. CBC revealed chronic low RBC/HGB, CMP revealed GFR 43 and elevated Glucose/SCr,BUN. A1C - 8.4%. Lipid panel - TC 239, TG 418, LDL 124 HDL 41.   Consult Visit: 08/15/2019 - Dr. Lyda Jester recommended taking benefiber daily and miralax prn if no bowel movement by supper. Patient to record all BM and Miralax to report at follow-up visit in 6 weeks.   07/19/2019 - results for diabetic eye exam with no retinopathy.   Medications: Outpatient Encounter Medications as of 11/08/2019  Medication Sig  . allopurinol (ZYLOPRIM) 300 MG tablet Take 300 mg by mouth daily.  Marland Kitchen atenolol-chlorthalidone (TENORETIC) 100-25 MG tablet Take 1 tablet by mouth daily.  . clotrimazole-betamethasone (LOTRISONE) cream   . colchicine 0.6 MG tablet Take 0.6 mg by mouth daily.  . fesoterodine (TOVIAZ) 4 MG  TB24 tablet Take 4 mg by mouth daily.  . fluticasone (FLONASE) 50 MCG/ACT nasal spray Place 1 spray into both nostrils daily.  . furosemide (LASIX) 20 MG tablet Take 20 mg by mouth daily.  Marland Kitchen gabapentin (NEURONTIN) 300 MG capsule Take 300 mg by mouth at bedtime.  . hydrALAZINE (APRESOLINE) 10 MG tablet Take 2 tablets (20 mg total) by mouth 2 (two) times daily with a meal.  . lisinopril (ZESTRIL) 10 MG tablet Take 10 mg by mouth daily.  . metFORMIN (GLUCOPHAGE) 1000 MG tablet Take 1,000 mg by mouth 2 (two) times daily with a meal.  . MITIGARE 0.6 MG CAPS   . Omega-3 Fatty Acids (RA FISH OIL) 1000 MG CPDR Take by mouth.  . oxyCODONE-acetaminophen (PERCOCET) 10-325 MG tablet Take 1 tablet by mouth every 6 (six) hours as needed for pain.  . phenytoin (DILANTIN) 100 MG ER capsule Take 100 mg by mouth. Take three capsules twice daily  . pravastatin (PRAVACHOL) 40 MG tablet Take 40 mg by mouth daily.  . predniSONE (STERAPRED UNI-PAK 21 TAB) 10 MG (21) TBPK tablet Take 6ills first day , then 5 pills day 2 and then cut down one pill day until gone  . warfarin (COUMADIN) 3 MG tablet Take 1 tablet (3 mg total) by mouth daily.  Marland Kitchen warfarin (COUMADIN) 4 MG tablet Take 1 tablet (4 mg total) by mouth daily.  . Wheat Dextrin (BENEFIBER) POWD Take by mouth. Mix 1 tablespoonful in a drink once daily.   Facility-Administered Encounter Medications as of 11/08/2019  Medication  . triamcinolone acetonide (KENALOG-40) injection 80  mg     Current Diagnosis/Assessment:  Goals Addressed            This Visit's Progress   . Pharmacy Care Plan       CARE PLAN ENTRY  Current Barriers:  . Chronic Disease Management support, education, and care coordination needs related to Hypertension, Hyperlipidemia, and Diabetes   Hypertension . Pharmacist Clinical Goal(s): o Over the next 90 days, patient will work with PharmD and providers to achieve BP goal <130/80 . Current regimen:  o atenolol-chlorthalidone 100-25  mg daily o furosemide 20 mg daily o hydralazine 10 mg 2tabs bid o lisinopril 10 mg daily . Interventions: o Recommend patient discuss elevated blood pressures with Dr. Henrene Pastor at next visit.  o Recommend patient work to improve diet for better bp control. Counseled on reducing breakfast meats.  . Patient self care activities - Over the next 90 days, patient will: o Check BP 90, document, and provide at future appointments o Ensure daily salt intake < 2300 mg/day  Hyperlipidemia . Pharmacist Clinical Goal(s): o Over the next 90 days, patient will work with PharmD and providers to maintain LDL goal < 70 . Current regimen:  o Pravastatin 40 mg daily . Interventions: o Recommend patient work to improve blood sugars to improve elevated TG.  . Patient self care activities - Over the next 90 days, patient will: o Patient will continue taking medications as prescribed and contact provider with any questions/concerns.   Diabetes . Pharmacist Clinical Goal(s): o Over the next 90 days, patient will work with PharmD and providers to achieve A1c goal <7% . Current regimen:  o Metformin 1000 mg bid . Interventions: o Recommend patient reduce carbohydrate and sugar intake in daily diet to improve fasting blood sugars.  . Patient self care activities - Over the next 90 days, patient will: o Check blood sugar 2-3 times weekly, document, and provide at future appointments o Contact provider with any episodes of hypoglycemia  Medication management . Pharmacist Clinical Goal(s): o Over the next 90 days, patient will work with PharmD and providers to maintain optimal medication adherence . Current pharmacy: [facility] . Interventions o Comprehensive medication review performed. o Continue current medication management strategy . Patient self care activities - Over the next 90 days, patient will: o Focus on medication adherence by continuing to use pill box.  o Take medications as  prescribed o Report any questions or concerns to PharmD and/or provider(s)  Please see past updates related to this goal by clicking on the "Past Updates" button in the selected goal         Diabetes   Recent Relevant Labs: Lab Results  Component Value Date/Time   HGBA1C 8.9 (H) 10/15/2019 10:14 AM   HGBA1C 8.4 06/07/2019 12:00 AM   HGBA1C 8.0 11/12/2018 12:00 AM   MICROALBUR 150 10/15/2019 10:05 AM   MICROALBUR 150 07/20/2017 12:00 AM     Checking BG: Weekly - patient says really painful to check so doing a few times weekly.   Recent FBG Readings: 140-150 mg/dL Patient has failed these medicatioin past: glimepiride Patient is currently uncontrolled on the following medications: Metformin 1000 mg bid  Last diabetic Foot exam: Dr. Henrene Pastor checks in office.  Last eye exam - Due now and has a card to make an appointment.   We discussed: how to recognize and treat signs of hypoglycemia. Home health nurse is also checking feet. Diarrhea issue could be improved from eating with Metformin.   Update  11/08/2019 - Blood sugar was 153 mg/dL yesterday am. Was 200 mg/dL when [facility] the other day. Drinks a lot of diet drinks and can't stay away from sugar. Loves potatoes and starch.   Plan Try taking metformin with food to avoid diarrhea.  Continue current medications,  Hypertension   Office blood pressures are  BP Readings from Last 3 Encounters:  10/30/19 (!) 150/90  10/22/19 (!) 180/70  10/15/19 (!) 150/80   Lab Results  Component Value Date   CREATININE 1.49 (H) 10/15/2019   Kidney Function Lab Results  Component Value Date/Time   CREATININE 1.49 (H) 10/15/2019 10:14 AM   GFRNONAA 49 (L) 10/15/2019 10:14 AM   GFRAA 57 (L) 10/15/2019 10:14 AM    Patient has failed these meds in the past: none that he can remember at this time.  Patient is currently uncontrolled on the following medications: atenolol-chlorthalidone 100-25 mg daily, furosemide 20 mg daily, hydralazine  10 mg 2tabs bid, lisinopril 10 mg daily  Patient checks BP at home several times per month  Patient home BP readings are ranging: 150/100  We discussed diet and exercise extensively. Patient admits he probably doesn't eat right. Eats bacon, sausage, eggs regularly. Admits he needs to be "off of sodium" due to legs swelling.   Update 11/08/2019: High blood pressure runs in his family. States it has always been high. He sees Dr. Henrene Pastor 06/14 and will discuss.   Plan  Continue current medications. Will try to cut back on sodium intake.   Hyperlipidemia/Hx of Stroke  Hx. Of stroke. Has had some stent.   Lipid Panel     Component Value Date/Time   CHOL 140 10/15/2019 1014   TRIG 290 (H) 10/15/2019 1014   HDL 34 (L) 10/15/2019 1014   CHOLHDL 4.1 10/15/2019 1014   LDLCALC 60 10/15/2019 1014   LABVLDL 46 (H) 10/15/2019 1014     Patient has failed these meds in past: omega 3 fatty acids 1000 mg Patient is currently uncontrolled on the following medications: pravastatin 40 mg daily  We discussed:  diet and exercise extensively. We also discussed moving pravastatin to evening medications.   Update 11/08/2019: Recommend patient work to reduce fat sources in diet. Work to control blood sugar to improve TG.   Plan  Continue current medications    And Bowel/Urinary Incontinence:    Patient has failed these meds in past: laxatives, magnesium Patient is currently controlled on the following medications: Benefiber daily, Miralax PRN - haven't used yet, Toviaz samples  We discussed:  trying metofrmin with food to improve diarrhea. Discussing with urology about generic alternative for Lisbeth Ply if continued long-term. Lisbeth Ply is new to patient and MD provided samples for now.    Update 11/08/2019 - Patient is still dealing with urinary incontinence as well as bowel issues. He will follow-up with Dr. Nila Nephew soon for results of recent test.   Plan  Continue current medications    Gout    Kidney Function Lab Results  Component Value Date/Time   CREATININE 1.49 (H) 10/15/2019 10:14 AM   GFRNONAA 49 (L) 10/15/2019 10:14 AM   GFRAA 57 (L) 10/15/2019 10:14 AM     Patient has failed these meds in past: colchicine Patient is currently uncontrolled on the following medications:  . Allopurinol 300 mg daily  We discussed 11/08/2019:  Patient reports that medication seems to be improving symptoms although still having discomfort in shoulders and arms.   Plan  Continue current medications   Medication Management  Pt uses [employer] for all medications Using weekly pill box. Discussed packaging option but patient does not feel needed at this time.  Pt endorses good compliance  We discussed:  Continuing to use medication organizer to keep with good compliance. Patient is very cautious taking phenytoin as directed to avoid a seizure.   Plan  Continue current medication regimen.     Follow up: 1 month

## 2019-11-08 ENCOUNTER — Ambulatory Visit: Payer: Medicare HMO

## 2019-11-08 DIAGNOSIS — E1121 Type 2 diabetes mellitus with diabetic nephropathy: Secondary | ICD-10-CM

## 2019-11-08 DIAGNOSIS — E782 Mixed hyperlipidemia: Secondary | ICD-10-CM

## 2019-11-08 DIAGNOSIS — I10 Essential (primary) hypertension: Secondary | ICD-10-CM

## 2019-11-11 ENCOUNTER — Ambulatory Visit: Payer: Medicare HMO | Admitting: Legal Medicine

## 2019-11-11 DIAGNOSIS — J449 Chronic obstructive pulmonary disease, unspecified: Secondary | ICD-10-CM | POA: Diagnosis not present

## 2019-11-11 DIAGNOSIS — J9601 Acute respiratory failure with hypoxia: Secondary | ICD-10-CM | POA: Diagnosis not present

## 2019-11-11 DIAGNOSIS — N401 Enlarged prostate with lower urinary tract symptoms: Secondary | ICD-10-CM | POA: Diagnosis not present

## 2019-11-11 DIAGNOSIS — R351 Nocturia: Secondary | ICD-10-CM | POA: Diagnosis not present

## 2019-11-11 DIAGNOSIS — N3281 Overactive bladder: Secondary | ICD-10-CM | POA: Diagnosis not present

## 2019-11-12 DIAGNOSIS — I2510 Atherosclerotic heart disease of native coronary artery without angina pectoris: Secondary | ICD-10-CM | POA: Diagnosis not present

## 2019-11-12 DIAGNOSIS — E1151 Type 2 diabetes mellitus with diabetic peripheral angiopathy without gangrene: Secondary | ICD-10-CM | POA: Diagnosis not present

## 2019-11-12 DIAGNOSIS — N1832 Chronic kidney disease, stage 3b: Secondary | ICD-10-CM | POA: Diagnosis not present

## 2019-11-12 DIAGNOSIS — I872 Venous insufficiency (chronic) (peripheral): Secondary | ICD-10-CM | POA: Diagnosis not present

## 2019-11-12 DIAGNOSIS — E1122 Type 2 diabetes mellitus with diabetic chronic kidney disease: Secondary | ICD-10-CM | POA: Diagnosis not present

## 2019-11-12 DIAGNOSIS — I1310 Hypertensive heart and chronic kidney disease without heart failure, with stage 1 through stage 4 chronic kidney disease, or unspecified chronic kidney disease: Secondary | ICD-10-CM | POA: Diagnosis not present

## 2019-11-12 DIAGNOSIS — E1142 Type 2 diabetes mellitus with diabetic polyneuropathy: Secondary | ICD-10-CM | POA: Diagnosis not present

## 2019-11-12 DIAGNOSIS — I890 Lymphedema, not elsewhere classified: Secondary | ICD-10-CM | POA: Diagnosis not present

## 2019-11-12 DIAGNOSIS — J449 Chronic obstructive pulmonary disease, unspecified: Secondary | ICD-10-CM | POA: Diagnosis not present

## 2019-11-12 NOTE — Patient Instructions (Addendum)
Visit Information  Goals Addressed            This Visit's Progress   . Pharmacy Care Plan       CARE PLAN ENTRY  Current Barriers:  . Chronic Disease Management support, education, and care coordination needs related to Hypertension, Hyperlipidemia, and Diabetes   Hypertension . Pharmacist Clinical Goal(s): o Over the next 90 days, patient will work with PharmD and providers to achieve BP goal <130/80 . Current regimen:  o atenolol-chlorthalidone 100-25 mg daily o furosemide 20 mg daily o hydralazine 10 mg 2tabs bid o lisinopril 10 mg daily . Interventions: o Recommend patient discuss elevated blood pressures with Dr. Henrene Pastor at next visit.  o Recommend patient work to improve diet for better bp control. Counseled on reducing breakfast meats.  . Patient self care activities - Over the next 90 days, patient will: o Check BP 90, document, and provide at future appointments o Ensure daily salt intake < 2300 mg/day  Hyperlipidemia . Pharmacist Clinical Goal(s): o Over the next 90 days, patient will work with PharmD and providers to maintain LDL goal < 70 . Current regimen:  o Pravastatin 40 mg daily . Interventions: o Recommend patient work to improve blood sugars to improve elevated TG.  . Patient self care activities - Over the next 90 days, patient will: o Patient will continue taking medications as prescribed and contact provider with any questions/concerns.   Diabetes . Pharmacist Clinical Goal(s): o Over the next 90 days, patient will work with PharmD and providers to achieve A1c goal <7% . Current regimen:  o Metformin 1000 mg bid . Interventions: o Recommend patient reduce carbohydrate and sugar intake in daily diet to improve fasting blood sugars.  . Patient self care activities - Over the next 90 days, patient will: o Check blood sugar 2-3 times weekly, document, and provide at future appointments o Contact provider with any episodes of hypoglycemia  Medication  management . Pharmacist Clinical Goal(s): o Over the next 90 days, patient will work with PharmD and providers to maintain optimal medication adherence . Current pharmacy: [facility] . Interventions o Comprehensive medication review performed. o Continue current medication management strategy . Patient self care activities - Over the next 90 days, patient will: o Focus on medication adherence by continuing to use pill box.  o Take medications as prescribed o Report any questions or concerns to PharmD and/or provider(s)  Please see past updates related to this goal by clicking on the "Past Updates" button in the selected goal         The patient verbalized understanding of instructions provided today and declined a print copy of patient instruction materials.   Telephone follow up appointment with pharmacy team member scheduled for: 11/2019  Sherre Poot, PharmD, [facility] Clinical Pharmacist [facility] [phone] (office) [phone] (mobile)  Dulaglutide injection What is this medicine? DULAGLUTIDE (DOO la GLOO tide) is used to improve blood sugar control in adults with type 2 diabetes. This medicine may be used with other oral diabetes medicines. This drug may also reduce the risk of heart attack or stroke if you have type 2 diabetes and risk factors for heart disease. This medicine may be used for other purposes; ask your health care provider or pharmacist if you have questions. COMMON BRAND NAME(S): Trulicity What should I tell my health care provider before I take this medicine? They need to know if you have any of these conditions:  endocrine tumors (MEN 2)  or if someone in your family had these tumors  eye disease, vision problems  history of pancreatitis  kidney disease  liver disease  stomach problems  thyroid cancer or if someone in your family had thyroid cancer  an unusual or allergic reaction to dulaglutide, other medicines, foods,  dyes, or preservatives  pregnant or trying to get pregnant  breast-feeding How should I use this medicine? This medicine is for injection under the skin of your upper leg (thigh), stomach area, or upper arm. It is usually given once every week (every 7 days). You will be taught how to prepare and give this medicine. Use exactly as directed. Take your medicine at regular intervals. Do not take it more often than directed. If you use this medicine with insulin, you should inject this medicine and the insulin separately. Do not mix them together. Do not give the injections right next to each other. Change (rotate) injection sites with each injection. It is important that you put your used needles and syringes in a special sharps container. Do not put them in a trash can. If you do not have a sharps container, call your pharmacist or healthcare provider to get one. A special MedGuide will be given to you by the pharmacist with each prescription and refill. Be sure to read this information carefully each time. This drug comes with INSTRUCTIONS FOR USE. Ask your pharmacist for directions on how to use this drug. Read the information carefully. Talk to your pharmacist or health care provider if you have questions. Talk to your pediatrician regarding the use of this medicine in children. Special care may be needed. Overdosage: If you think you have taken too much of this medicine contact a poison control center or emergency room at once. NOTE: This medicine is only for you. Do not share this medicine with others. What if I miss a dose? If you miss a dose, take it as soon as you can within 3 days after the missed dose. Then take your next dose at your regular weekly time. If it has been longer than 3 days after the missed dose, do not take the missed dose. Take the next dose at your regular time. Do not take double or extra doses. If you have questions about a missed dose, contact your health care provider for  advice. What may interact with this medicine?  other medicines for diabetes Many medications may cause changes in blood sugar, these include:  alcohol containing beverages  antiviral medicines for HIV or AIDS  aspirin and aspirin-like drugs  certain medicines for blood pressure, heart disease, irregular heart beat  chromium  diuretics  male hormones, such as estrogens or progestins, birth control pills  fenofibrate  gemfibrozil  isoniazid  lanreotide  male hormones or anabolic steroids  MAOIs like Carbex, Eldepryl, Marplan, Nardil, and Parnate  medicines for weight loss  medicines for allergies, asthma, cold, or cough  medicines for depression, anxiety, or psychotic disturbances  niacin  nicotine  NSAIDs, medicines for pain and inflammation, like ibuprofen or naproxen  octreotide  pasireotide  pentamidine  phenytoin  probenecid  quinolone antibiotics such as ciprofloxacin, levofloxacin, ofloxacin  some herbal dietary supplements  steroid medicines such as prednisone or cortisone  sulfamethoxazole; trimethoprim  thyroid hormones Some medications can hide the warning symptoms of low blood sugar (hypoglycemia). You may need to monitor your blood sugar more closely if you are taking one of these medications. These include:  beta-blockers, often used for high blood pressure  or heart problems (examples include atenolol, metoprolol, propranolol)  clonidine  guanethidine  reserpine This list may not describe all possible interactions. Give your health care provider a list of all the medicines, herbs, non-prescription drugs, or dietary supplements you use. Also tell them if you smoke, drink alcohol, or use illegal drugs. Some items may interact with your medicine. What should I watch for while using this medicine? Visit your doctor or health care professional for regular checks on your progress. Drink plenty of fluids while taking this medicine.  Check with your doctor or health care professional if you get an attack of severe diarrhea, nausea, and vomiting. The loss of too much body fluid can make it dangerous for you to take this medicine. A test called the HbA1C (A1C) will be monitored. This is a simple blood test. It measures your blood sugar control over the last 2 to 3 months. You will receive this test every 3 to 6 months. Learn how to check your blood sugar. Learn the symptoms of low and high blood sugar and how to manage them. Always carry a quick-source of sugar with you in case you have symptoms of low blood sugar. Examples include hard sugar candy or glucose tablets. Make sure others know that you can choke if you eat or drink when you develop serious symptoms of low blood sugar, such as seizures or unconsciousness. They must get medical help at once. Tell your doctor or health care professional if you have high blood sugar. You might need to change the dose of your medicine. If you are sick or exercising more than usual, you might need to change the dose of your medicine. Do not skip meals. Ask your doctor or health care professional if you should avoid alcohol. Many nonprescription cough and cold products contain sugar or alcohol. These can affect blood sugar. Pens should never be shared. Even if the needle is changed, sharing may result in passing of viruses like hepatitis or HIV. Wear a medical ID bracelet or chain, and carry a card that describes your disease and details of your medicine and dosage times. What side effects may I notice from receiving this medicine? Side effects that you should report to your doctor or health care professional as soon as possible:  allergic reactions like skin rash, itching or hives, swelling of the face, lips, or tongue  breathing problems  changes in vision  diarrhea that continues or is severe  lump or swelling on the neck  severe nausea  signs and symptoms of infection like fever or  chills; cough; sore throat; pain or trouble passing urine  signs and symptoms of low blood sugar such as feeling anxious, confusion, dizziness, increased hunger, unusually weak or tired, sweating, shakiness, cold, irritable, headache, blurred vision, fast heartbeat, loss of consciousness  signs and symptoms of kidney injury like trouble passing urine or change in the amount of urine  trouble swallowing  unusual stomach upset or pain  vomiting Side effects that usually do not require medical attention (report to your doctor or health care professional if they continue or are bothersome):  diarrhea  loss of appetite  nausea  pain, redness, or irritation at site where injected  stomach upset This list may not describe all possible side effects. Call your doctor for medical advice about side effects. You may report side effects to FDA at 1-800-FDA-1088. Where should I keep my medicine? Keep out of the reach of children. Store unopened pens in a refrigerator between  2 and 8 degrees C (36 and 46 degrees F). Do not freeze or use if the medicine has been frozen. Protect from light and excessive heat. Store in the carton until use. Each single-dose pen can be kept at room temperature, not to exceed 30 degrees C (86 degrees F) for a total of 14 days, if needed. Throw away any unused medicine after the expiration date on the label. NOTE: This sheet is a summary. It may not cover all possible information. If you have questions about this medicine, talk to your doctor, pharmacist, or health care provider.  2020 Elsevier/Gold Standard (2019-01-29 09:34:53)

## 2019-11-13 DIAGNOSIS — I2510 Atherosclerotic heart disease of native coronary artery without angina pectoris: Secondary | ICD-10-CM | POA: Diagnosis not present

## 2019-11-13 DIAGNOSIS — N1832 Chronic kidney disease, stage 3b: Secondary | ICD-10-CM | POA: Diagnosis not present

## 2019-11-13 DIAGNOSIS — I872 Venous insufficiency (chronic) (peripheral): Secondary | ICD-10-CM | POA: Diagnosis not present

## 2019-11-13 DIAGNOSIS — E1151 Type 2 diabetes mellitus with diabetic peripheral angiopathy without gangrene: Secondary | ICD-10-CM | POA: Diagnosis not present

## 2019-11-13 DIAGNOSIS — J449 Chronic obstructive pulmonary disease, unspecified: Secondary | ICD-10-CM | POA: Diagnosis not present

## 2019-11-13 DIAGNOSIS — E1142 Type 2 diabetes mellitus with diabetic polyneuropathy: Secondary | ICD-10-CM | POA: Diagnosis not present

## 2019-11-13 DIAGNOSIS — I890 Lymphedema, not elsewhere classified: Secondary | ICD-10-CM | POA: Diagnosis not present

## 2019-11-13 DIAGNOSIS — I1310 Hypertensive heart and chronic kidney disease without heart failure, with stage 1 through stage 4 chronic kidney disease, or unspecified chronic kidney disease: Secondary | ICD-10-CM | POA: Diagnosis not present

## 2019-11-13 DIAGNOSIS — E1122 Type 2 diabetes mellitus with diabetic chronic kidney disease: Secondary | ICD-10-CM | POA: Diagnosis not present

## 2019-11-14 ENCOUNTER — Other Ambulatory Visit: Payer: Self-pay | Admitting: Legal Medicine

## 2019-11-15 ENCOUNTER — Other Ambulatory Visit: Payer: Self-pay

## 2019-11-15 ENCOUNTER — Other Ambulatory Visit: Payer: Self-pay | Admitting: Legal Medicine

## 2019-11-15 ENCOUNTER — Ambulatory Visit (INDEPENDENT_AMBULATORY_CARE_PROVIDER_SITE_OTHER): Payer: Medicare HMO | Admitting: Legal Medicine

## 2019-11-15 ENCOUNTER — Encounter: Payer: Self-pay | Admitting: Legal Medicine

## 2019-11-15 VITALS — BP 170/80 | HR 75 | Temp 97.9°F | Resp 16 | Ht 72.0 in | Wt 322.0 lb

## 2019-11-15 DIAGNOSIS — I890 Lymphedema, not elsewhere classified: Secondary | ICD-10-CM | POA: Diagnosis not present

## 2019-11-15 DIAGNOSIS — I2510 Atherosclerotic heart disease of native coronary artery without angina pectoris: Secondary | ICD-10-CM | POA: Diagnosis not present

## 2019-11-15 DIAGNOSIS — J449 Chronic obstructive pulmonary disease, unspecified: Secondary | ICD-10-CM | POA: Diagnosis not present

## 2019-11-15 DIAGNOSIS — N1832 Chronic kidney disease, stage 3b: Secondary | ICD-10-CM | POA: Diagnosis not present

## 2019-11-15 DIAGNOSIS — E1122 Type 2 diabetes mellitus with diabetic chronic kidney disease: Secondary | ICD-10-CM | POA: Diagnosis not present

## 2019-11-15 DIAGNOSIS — M7552 Bursitis of left shoulder: Secondary | ICD-10-CM

## 2019-11-15 DIAGNOSIS — E1142 Type 2 diabetes mellitus with diabetic polyneuropathy: Secondary | ICD-10-CM | POA: Diagnosis not present

## 2019-11-15 DIAGNOSIS — I872 Venous insufficiency (chronic) (peripheral): Secondary | ICD-10-CM | POA: Diagnosis not present

## 2019-11-15 DIAGNOSIS — I1310 Hypertensive heart and chronic kidney disease without heart failure, with stage 1 through stage 4 chronic kidney disease, or unspecified chronic kidney disease: Secondary | ICD-10-CM | POA: Diagnosis not present

## 2019-11-15 DIAGNOSIS — E1151 Type 2 diabetes mellitus with diabetic peripheral angiopathy without gangrene: Secondary | ICD-10-CM | POA: Diagnosis not present

## 2019-11-15 MED ORDER — TRIAMCINOLONE ACETONIDE 40 MG/ML IJ SUSP
80.0000 mg | Freq: Once | INTRAMUSCULAR | Status: AC
  Administered 2019-11-15: 80 mg via INTRA_ARTICULAR

## 2019-11-15 NOTE — Progress Notes (Signed)
Acute Office Visit  Subjective:    Patient ID: Matthew Gutierrez, male    DOB: 20-Oct-1955, 64 y.o.   MRN: [id number]  Chief Complaint  Patient presents with  . Bursitis of left shoulder    HPI Patient is in today for left shoulder adhesive capsulitis. He had one injection 2 weeks ago and feels better but still some pain and decreased motion, may need another injection .  Past Medical History:  Diagnosis Date  . Epilepsy, grand mal ([place])   . Frozen shoulder    Right Shoulder  . Lumbago   . Peripheral vascular disease ([place])   . Stroke [facility])     Past Surgical History:  Procedure Laterality Date  . CORONARY STENT PLACEMENT      Family History  Problem Relation Age of Onset  . Cancer Mother   . Stroke Father   . Hypertension Other   . Stroke Other   . Hyperlipidemia Other   . Alcohol abuse Other   . Heart disease Other   . Cancer Other     Social History   Socioeconomic History  . Marital status: Divorced    Spouse name: Not on file  . Number of children: 2  . Years of education: Not on file  . Highest education level: Not on file  Occupational History  . Occupation: Disability  Tobacco Use  . Smoking status: Former Smoker    Quit date: 10/28/2009    Years since quitting: 10.0  . Smokeless tobacco: Never Used  Substance and Sexual Activity  . Alcohol use: Yes    Comment: occassionally  . Drug use: No  . Sexual activity: Not Currently  Other Topics Concern  . Not on file  Social History Narrative  . Not on file   Social Determinants of Health   Financial Resource Strain:   . Difficulty of Paying Living Expenses:   Food Insecurity:   . Worried About Charity fundraiser in the Last Year:   . Arboriculturist in the Last Year:   Transportation Needs:   . Film/video editor (Medical):   Marland Kitchen Lack of Transportation (Non-Medical):   Physical Activity: Inactive  . Days of Exercise per Week: 0 days  . Minutes of Exercise per Session: 0 min  Stress:   .  Feeling of Stress :   Social Connections:   . Frequency of Communication with Friends and Family:   . Frequency of Social Gatherings with Friends and Family:   . Attends Religious Services:   . Active Member of Clubs or Organizations:   . Attends Archivist Meetings:   Marland Kitchen Marital Status:   Intimate Partner Violence: Not At Risk  . Fear of Current or Ex-Partner: No  . Emotionally Abused: No  . Physically Abused: No  . Sexually Abused: No    Outpatient Medications Prior to Visit  Medication Sig Dispense Refill  . allopurinol (ZYLOPRIM) 300 MG tablet Take 300 mg by mouth daily.    Marland Kitchen atenolol-chlorthalidone (TENORETIC) 100-25 MG tablet Take 1 tablet by mouth daily. 90 tablet 2  . clotrimazole-betamethasone (LOTRISONE) cream     . fesoterodine (TOVIAZ) 4 MG TB24 tablet Take 4 mg by mouth daily.    . fluticasone (FLONASE) 50 MCG/ACT nasal spray Place 1 spray into both nostrils daily. 18.2 mL 2  . furosemide (LASIX) 20 MG tablet Take 20 mg by mouth daily.    Marland Kitchen gabapentin (NEURONTIN) 300 MG capsule Take 300 mg by mouth  at bedtime.    . hydrALAZINE (APRESOLINE) 10 MG tablet Take 2 tablets (20 mg total) by mouth 2 (two) times daily with a meal. 360 tablet 2  . lisinopril (ZESTRIL) 10 MG tablet Take 10 mg by mouth daily.    . metFORMIN (GLUCOPHAGE) 1000 MG tablet Take 1,000 mg by mouth 2 (two) times daily with a meal.    . MITIGARE 0.6 MG CAPS     . Omega-3 Fatty Acids (RA FISH OIL) 1000 MG CPDR Take by mouth.    . oxyCODONE-acetaminophen (PERCOCET) 10-325 MG tablet Take 1 tablet by mouth every 6 (six) hours as needed for pain. 30 tablet 0  . phenytoin (DILANTIN) 100 MG ER capsule Take 100 mg by mouth. Take three capsules twice daily    . pravastatin (PRAVACHOL) 40 MG tablet Take 40 mg by mouth daily.    Marland Kitchen warfarin (COUMADIN) 3 MG tablet Take 1 tablet (3 mg total) by mouth daily. 30 tablet 0  . warfarin (COUMADIN) 4 MG tablet Take 1 tablet (4 mg total) by mouth daily. 90 tablet 2  .  Wheat Dextrin (BENEFIBER) POWD Take by mouth. Mix 1 tablespoonful in a drink once daily.    . predniSONE (STERAPRED UNI-PAK 21 TAB) 10 MG (21) TBPK tablet Take 6ills first day , then 5 pills day 2 and then cut down one pill day until gone 21 tablet 0  . colchicine 0.6 MG tablet Take 0.6 mg by mouth daily.    Marland Kitchen triamcinolone acetonide (KENALOG-40) injection 80 mg      No facility-administered medications prior to visit.    Allergies  Allergen Reactions  . Codeine     Review of Systems  Constitutional: Negative.   HENT: Negative.   Eyes: Negative.   Respiratory: Negative.   Cardiovascular: Negative.   Gastrointestinal: Negative.   Genitourinary: Negative.   Musculoskeletal: Positive for arthralgias.  Skin: Negative.   Neurological: Negative.   Psychiatric/Behavioral: Negative.        Objective:    Physical Exam Vitals reviewed.  Constitutional:      Appearance: Normal appearance.  HENT:     Head: Normocephalic and atraumatic.  Cardiovascular:     Rate and Rhythm: Normal rate and regular rhythm.     Pulses: Normal pulses.     Heart sounds: Normal heart sounds.  Pulmonary:     Effort: Pulmonary effort is normal.     Breath sounds: Normal breath sounds.  Musculoskeletal:     Right shoulder: Normal.     Left shoulder: Tenderness present. Decreased range of motion. Decreased strength.       Arms:     Comments: ABDUCTION PASSIVE 70 DEGREES, FLEXION 60 DEGREES, ROTATION 80 DEGREES, STILL PAINFUL  Neurological:     Mental Status: He is alert.     BP (!) 170/80   Pulse 75   Temp 97.9 F (36.6 C)   Resp 16   Ht 6' (1.829 m)   Wt (!) 322 lb (146.1 kg)   SpO2 94%   BMI 43.67 kg/m  Wt Readings from Last 3 Encounters:  11/15/19 (!) 322 lb (146.1 kg)  10/30/19 (!) 315 lb (142.9 kg)  10/22/19 (!) 315 lb 3.2 oz (143 kg)    Health Maintenance Due  Topic Date Due  . Hepatitis C Screening  Never done  . COVID-19 Vaccine (1) Never done  . TETANUS/TDAP  Never done    . OPHTHALMOLOGY EXAM  07/25/2019    There are no preventive care reminders  to display for this patient.   No results found for: TSH Lab Results  Component Value Date   WBC 9.2 10/15/2019   HGB 10.8 (L) 10/15/2019   HCT 34.0 (L) 10/15/2019   MCV 90 10/15/2019   PLT 274 10/15/2019   Lab Results  Component Value Date   NA 135 10/15/2019   K 5.1 10/15/2019   CO2 22 10/15/2019   GLUCOSE 289 (H) 10/15/2019   BUN 38 (H) 10/15/2019   CREATININE 1.49 (H) 10/15/2019   BILITOT <0.2 10/15/2019   ALKPHOS 134 (H) 10/15/2019   AST 17 10/15/2019   ALT 16 10/15/2019   PROT 7.5 10/15/2019   ALBUMIN 3.7 (L) 10/15/2019   CALCIUM 9.0 10/15/2019   Lab Results  Component Value Date   CHOL 140 10/15/2019   Lab Results  Component Value Date   HDL 34 (L) 10/15/2019   Lab Results  Component Value Date   LDLCALC 60 10/15/2019   Lab Results  Component Value Date   TRIG 290 (H) 10/15/2019   Lab Results  Component Value Date   CHOLHDL 4.1 10/15/2019   Lab Results  Component Value Date   HGBA1C 8.9 (H) 10/15/2019       Assessment & Plan:  1. Acute bursitis of left shoulder - triamcinolone acetonide (KENALOG-40) injection 80 mg The left shoulder still has significant pain and loss of RO,.  We discussed and decided repeat injection would improve his condition   Meds ordered this encounter  Medications  . triamcinolone acetonide (KENALOG-40) injection 80 mg    No orders of the defined types were placed in this encounter.    Follow-up: Return in about 3 years (around 11/15/2022) for chroni diabetes.  An After Visit Summary was printed and given to the patient.  [place] [phone]

## 2019-11-18 ENCOUNTER — Other Ambulatory Visit: Payer: Self-pay

## 2019-11-18 DIAGNOSIS — J449 Chronic obstructive pulmonary disease, unspecified: Secondary | ICD-10-CM | POA: Diagnosis not present

## 2019-11-18 DIAGNOSIS — K591 Functional diarrhea: Secondary | ICD-10-CM | POA: Diagnosis not present

## 2019-11-18 DIAGNOSIS — I1310 Hypertensive heart and chronic kidney disease without heart failure, with stage 1 through stage 4 chronic kidney disease, or unspecified chronic kidney disease: Secondary | ICD-10-CM | POA: Diagnosis not present

## 2019-11-18 DIAGNOSIS — E1122 Type 2 diabetes mellitus with diabetic chronic kidney disease: Secondary | ICD-10-CM | POA: Diagnosis not present

## 2019-11-18 DIAGNOSIS — I872 Venous insufficiency (chronic) (peripheral): Secondary | ICD-10-CM | POA: Diagnosis not present

## 2019-11-18 DIAGNOSIS — I890 Lymphedema, not elsewhere classified: Secondary | ICD-10-CM | POA: Diagnosis not present

## 2019-11-18 DIAGNOSIS — E1151 Type 2 diabetes mellitus with diabetic peripheral angiopathy without gangrene: Secondary | ICD-10-CM | POA: Diagnosis not present

## 2019-11-18 DIAGNOSIS — I2510 Atherosclerotic heart disease of native coronary artery without angina pectoris: Secondary | ICD-10-CM | POA: Diagnosis not present

## 2019-11-18 DIAGNOSIS — E1142 Type 2 diabetes mellitus with diabetic polyneuropathy: Secondary | ICD-10-CM | POA: Diagnosis not present

## 2019-11-18 DIAGNOSIS — N1832 Chronic kidney disease, stage 3b: Secondary | ICD-10-CM | POA: Diagnosis not present

## 2019-11-18 MED ORDER — ATENOLOL-CHLORTHALIDONE 100-25 MG PO TABS: 1.0000 | ORAL_TABLET | Freq: Every day | ORAL | 2 refills | Status: DC

## 2019-11-18 MED ORDER — LISINOPRIL 10 MG PO TABS: 10.0000 mg | ORAL_TABLET | Freq: Every day | ORAL | 2 refills | Status: DC

## 2019-11-18 MED ORDER — GABAPENTIN 300 MG PO CAPS: 300.0000 mg | ORAL_CAPSULE | Freq: Every day | ORAL | 2 refills | Status: DC

## 2019-11-18 MED ORDER — ALLOPURINOL 300 MG PO TABS: 300.0000 mg | ORAL_TABLET | Freq: Every day | ORAL | 1 refills | Status: DC

## 2019-11-18 MED ORDER — METFORMIN HCL 1000 MG PO TABS: 1000.0000 mg | ORAL_TABLET | Freq: Two times a day (BID) | ORAL | 2 refills | Status: DC

## 2019-11-18 MED ORDER — PRAVASTATIN SODIUM 40 MG PO TABS: 40.0000 mg | ORAL_TABLET | Freq: Every day | ORAL | 2 refills | Status: DC

## 2019-11-19 DIAGNOSIS — I1310 Hypertensive heart and chronic kidney disease without heart failure, with stage 1 through stage 4 chronic kidney disease, or unspecified chronic kidney disease: Secondary | ICD-10-CM | POA: Diagnosis not present

## 2019-11-19 DIAGNOSIS — E1142 Type 2 diabetes mellitus with diabetic polyneuropathy: Secondary | ICD-10-CM | POA: Diagnosis not present

## 2019-11-19 DIAGNOSIS — I2510 Atherosclerotic heart disease of native coronary artery without angina pectoris: Secondary | ICD-10-CM | POA: Diagnosis not present

## 2019-11-19 DIAGNOSIS — E1122 Type 2 diabetes mellitus with diabetic chronic kidney disease: Secondary | ICD-10-CM | POA: Diagnosis not present

## 2019-11-19 DIAGNOSIS — I872 Venous insufficiency (chronic) (peripheral): Secondary | ICD-10-CM | POA: Diagnosis not present

## 2019-11-19 DIAGNOSIS — J449 Chronic obstructive pulmonary disease, unspecified: Secondary | ICD-10-CM | POA: Diagnosis not present

## 2019-11-19 DIAGNOSIS — N1832 Chronic kidney disease, stage 3b: Secondary | ICD-10-CM | POA: Diagnosis not present

## 2019-11-19 DIAGNOSIS — E1151 Type 2 diabetes mellitus with diabetic peripheral angiopathy without gangrene: Secondary | ICD-10-CM | POA: Diagnosis not present

## 2019-11-19 DIAGNOSIS — I890 Lymphedema, not elsewhere classified: Secondary | ICD-10-CM | POA: Diagnosis not present

## 2019-11-21 DIAGNOSIS — I872 Venous insufficiency (chronic) (peripheral): Secondary | ICD-10-CM | POA: Diagnosis not present

## 2019-11-21 DIAGNOSIS — I1310 Hypertensive heart and chronic kidney disease without heart failure, with stage 1 through stage 4 chronic kidney disease, or unspecified chronic kidney disease: Secondary | ICD-10-CM | POA: Diagnosis not present

## 2019-11-21 DIAGNOSIS — E1122 Type 2 diabetes mellitus with diabetic chronic kidney disease: Secondary | ICD-10-CM | POA: Diagnosis not present

## 2019-11-21 DIAGNOSIS — J449 Chronic obstructive pulmonary disease, unspecified: Secondary | ICD-10-CM | POA: Diagnosis not present

## 2019-11-21 DIAGNOSIS — N1832 Chronic kidney disease, stage 3b: Secondary | ICD-10-CM | POA: Diagnosis not present

## 2019-11-21 DIAGNOSIS — E1151 Type 2 diabetes mellitus with diabetic peripheral angiopathy without gangrene: Secondary | ICD-10-CM | POA: Diagnosis not present

## 2019-11-21 DIAGNOSIS — I2510 Atherosclerotic heart disease of native coronary artery without angina pectoris: Secondary | ICD-10-CM | POA: Diagnosis not present

## 2019-11-21 DIAGNOSIS — E1142 Type 2 diabetes mellitus with diabetic polyneuropathy: Secondary | ICD-10-CM | POA: Diagnosis not present

## 2019-11-21 DIAGNOSIS — I890 Lymphedema, not elsewhere classified: Secondary | ICD-10-CM | POA: Diagnosis not present

## 2019-11-22 DIAGNOSIS — E1151 Type 2 diabetes mellitus with diabetic peripheral angiopathy without gangrene: Secondary | ICD-10-CM | POA: Diagnosis not present

## 2019-11-22 DIAGNOSIS — E1142 Type 2 diabetes mellitus with diabetic polyneuropathy: Secondary | ICD-10-CM | POA: Diagnosis not present

## 2019-11-22 DIAGNOSIS — I872 Venous insufficiency (chronic) (peripheral): Secondary | ICD-10-CM | POA: Diagnosis not present

## 2019-11-22 DIAGNOSIS — I1310 Hypertensive heart and chronic kidney disease without heart failure, with stage 1 through stage 4 chronic kidney disease, or unspecified chronic kidney disease: Secondary | ICD-10-CM | POA: Diagnosis not present

## 2019-11-22 DIAGNOSIS — N1832 Chronic kidney disease, stage 3b: Secondary | ICD-10-CM | POA: Diagnosis not present

## 2019-11-22 DIAGNOSIS — I2510 Atherosclerotic heart disease of native coronary artery without angina pectoris: Secondary | ICD-10-CM | POA: Diagnosis not present

## 2019-11-22 DIAGNOSIS — I890 Lymphedema, not elsewhere classified: Secondary | ICD-10-CM | POA: Diagnosis not present

## 2019-11-22 DIAGNOSIS — E1122 Type 2 diabetes mellitus with diabetic chronic kidney disease: Secondary | ICD-10-CM | POA: Diagnosis not present

## 2019-11-22 DIAGNOSIS — J449 Chronic obstructive pulmonary disease, unspecified: Secondary | ICD-10-CM | POA: Diagnosis not present

## 2019-11-24 DIAGNOSIS — I2510 Atherosclerotic heart disease of native coronary artery without angina pectoris: Secondary | ICD-10-CM | POA: Diagnosis not present

## 2019-11-24 DIAGNOSIS — E1142 Type 2 diabetes mellitus with diabetic polyneuropathy: Secondary | ICD-10-CM | POA: Diagnosis not present

## 2019-11-24 DIAGNOSIS — J449 Chronic obstructive pulmonary disease, unspecified: Secondary | ICD-10-CM | POA: Diagnosis not present

## 2019-11-24 DIAGNOSIS — I890 Lymphedema, not elsewhere classified: Secondary | ICD-10-CM | POA: Diagnosis not present

## 2019-11-24 DIAGNOSIS — I1310 Hypertensive heart and chronic kidney disease without heart failure, with stage 1 through stage 4 chronic kidney disease, or unspecified chronic kidney disease: Secondary | ICD-10-CM | POA: Diagnosis not present

## 2019-11-24 DIAGNOSIS — E1151 Type 2 diabetes mellitus with diabetic peripheral angiopathy without gangrene: Secondary | ICD-10-CM | POA: Diagnosis not present

## 2019-11-24 DIAGNOSIS — N1832 Chronic kidney disease, stage 3b: Secondary | ICD-10-CM | POA: Diagnosis not present

## 2019-11-24 DIAGNOSIS — E1122 Type 2 diabetes mellitus with diabetic chronic kidney disease: Secondary | ICD-10-CM | POA: Diagnosis not present

## 2019-11-24 DIAGNOSIS — I872 Venous insufficiency (chronic) (peripheral): Secondary | ICD-10-CM | POA: Diagnosis not present

## 2019-11-25 DIAGNOSIS — I1310 Hypertensive heart and chronic kidney disease without heart failure, with stage 1 through stage 4 chronic kidney disease, or unspecified chronic kidney disease: Secondary | ICD-10-CM | POA: Diagnosis not present

## 2019-11-25 DIAGNOSIS — E1151 Type 2 diabetes mellitus with diabetic peripheral angiopathy without gangrene: Secondary | ICD-10-CM | POA: Diagnosis not present

## 2019-11-25 DIAGNOSIS — N1832 Chronic kidney disease, stage 3b: Secondary | ICD-10-CM | POA: Diagnosis not present

## 2019-11-25 DIAGNOSIS — I890 Lymphedema, not elsewhere classified: Secondary | ICD-10-CM | POA: Diagnosis not present

## 2019-11-25 DIAGNOSIS — E1122 Type 2 diabetes mellitus with diabetic chronic kidney disease: Secondary | ICD-10-CM | POA: Diagnosis not present

## 2019-11-25 DIAGNOSIS — I872 Venous insufficiency (chronic) (peripheral): Secondary | ICD-10-CM | POA: Diagnosis not present

## 2019-11-25 DIAGNOSIS — J449 Chronic obstructive pulmonary disease, unspecified: Secondary | ICD-10-CM | POA: Diagnosis not present

## 2019-11-25 DIAGNOSIS — E1142 Type 2 diabetes mellitus with diabetic polyneuropathy: Secondary | ICD-10-CM | POA: Diagnosis not present

## 2019-11-25 DIAGNOSIS — I2510 Atherosclerotic heart disease of native coronary artery without angina pectoris: Secondary | ICD-10-CM | POA: Diagnosis not present

## 2019-11-26 DIAGNOSIS — I890 Lymphedema, not elsewhere classified: Secondary | ICD-10-CM | POA: Diagnosis not present

## 2019-11-26 DIAGNOSIS — I1310 Hypertensive heart and chronic kidney disease without heart failure, with stage 1 through stage 4 chronic kidney disease, or unspecified chronic kidney disease: Secondary | ICD-10-CM | POA: Diagnosis not present

## 2019-11-26 DIAGNOSIS — E1151 Type 2 diabetes mellitus with diabetic peripheral angiopathy without gangrene: Secondary | ICD-10-CM | POA: Diagnosis not present

## 2019-11-26 DIAGNOSIS — J449 Chronic obstructive pulmonary disease, unspecified: Secondary | ICD-10-CM | POA: Diagnosis not present

## 2019-11-26 DIAGNOSIS — E1142 Type 2 diabetes mellitus with diabetic polyneuropathy: Secondary | ICD-10-CM | POA: Diagnosis not present

## 2019-11-26 DIAGNOSIS — E1122 Type 2 diabetes mellitus with diabetic chronic kidney disease: Secondary | ICD-10-CM | POA: Diagnosis not present

## 2019-11-26 DIAGNOSIS — I2510 Atherosclerotic heart disease of native coronary artery without angina pectoris: Secondary | ICD-10-CM | POA: Diagnosis not present

## 2019-11-26 DIAGNOSIS — N1832 Chronic kidney disease, stage 3b: Secondary | ICD-10-CM | POA: Diagnosis not present

## 2019-11-26 DIAGNOSIS — I872 Venous insufficiency (chronic) (peripheral): Secondary | ICD-10-CM | POA: Diagnosis not present

## 2019-11-28 ENCOUNTER — Telehealth: Payer: Medicare HMO

## 2019-11-29 DIAGNOSIS — I872 Venous insufficiency (chronic) (peripheral): Secondary | ICD-10-CM | POA: Diagnosis not present

## 2019-11-29 DIAGNOSIS — I1310 Hypertensive heart and chronic kidney disease without heart failure, with stage 1 through stage 4 chronic kidney disease, or unspecified chronic kidney disease: Secondary | ICD-10-CM | POA: Diagnosis not present

## 2019-11-29 DIAGNOSIS — E1151 Type 2 diabetes mellitus with diabetic peripheral angiopathy without gangrene: Secondary | ICD-10-CM | POA: Diagnosis not present

## 2019-11-29 DIAGNOSIS — I2510 Atherosclerotic heart disease of native coronary artery without angina pectoris: Secondary | ICD-10-CM | POA: Diagnosis not present

## 2019-11-29 DIAGNOSIS — J449 Chronic obstructive pulmonary disease, unspecified: Secondary | ICD-10-CM | POA: Diagnosis not present

## 2019-11-29 DIAGNOSIS — N1832 Chronic kidney disease, stage 3b: Secondary | ICD-10-CM | POA: Diagnosis not present

## 2019-11-29 DIAGNOSIS — I890 Lymphedema, not elsewhere classified: Secondary | ICD-10-CM | POA: Diagnosis not present

## 2019-11-29 DIAGNOSIS — E1122 Type 2 diabetes mellitus with diabetic chronic kidney disease: Secondary | ICD-10-CM | POA: Diagnosis not present

## 2019-11-29 DIAGNOSIS — E1142 Type 2 diabetes mellitus with diabetic polyneuropathy: Secondary | ICD-10-CM | POA: Diagnosis not present

## 2019-11-30 DIAGNOSIS — I890 Lymphedema, not elsewhere classified: Secondary | ICD-10-CM | POA: Diagnosis not present

## 2019-11-30 DIAGNOSIS — I2510 Atherosclerotic heart disease of native coronary artery without angina pectoris: Secondary | ICD-10-CM | POA: Diagnosis not present

## 2019-11-30 DIAGNOSIS — J449 Chronic obstructive pulmonary disease, unspecified: Secondary | ICD-10-CM | POA: Diagnosis not present

## 2019-11-30 DIAGNOSIS — N1832 Chronic kidney disease, stage 3b: Secondary | ICD-10-CM | POA: Diagnosis not present

## 2019-11-30 DIAGNOSIS — E1151 Type 2 diabetes mellitus with diabetic peripheral angiopathy without gangrene: Secondary | ICD-10-CM | POA: Diagnosis not present

## 2019-11-30 DIAGNOSIS — I872 Venous insufficiency (chronic) (peripheral): Secondary | ICD-10-CM | POA: Diagnosis not present

## 2019-11-30 DIAGNOSIS — E1122 Type 2 diabetes mellitus with diabetic chronic kidney disease: Secondary | ICD-10-CM | POA: Diagnosis not present

## 2019-11-30 DIAGNOSIS — E1142 Type 2 diabetes mellitus with diabetic polyneuropathy: Secondary | ICD-10-CM | POA: Diagnosis not present

## 2019-11-30 DIAGNOSIS — I1310 Hypertensive heart and chronic kidney disease without heart failure, with stage 1 through stage 4 chronic kidney disease, or unspecified chronic kidney disease: Secondary | ICD-10-CM | POA: Diagnosis not present

## 2019-12-03 DIAGNOSIS — E1122 Type 2 diabetes mellitus with diabetic chronic kidney disease: Secondary | ICD-10-CM | POA: Diagnosis not present

## 2019-12-03 DIAGNOSIS — I890 Lymphedema, not elsewhere classified: Secondary | ICD-10-CM | POA: Diagnosis not present

## 2019-12-03 DIAGNOSIS — I1310 Hypertensive heart and chronic kidney disease without heart failure, with stage 1 through stage 4 chronic kidney disease, or unspecified chronic kidney disease: Secondary | ICD-10-CM | POA: Diagnosis not present

## 2019-12-03 DIAGNOSIS — J449 Chronic obstructive pulmonary disease, unspecified: Secondary | ICD-10-CM | POA: Diagnosis not present

## 2019-12-03 DIAGNOSIS — N1832 Chronic kidney disease, stage 3b: Secondary | ICD-10-CM | POA: Diagnosis not present

## 2019-12-03 DIAGNOSIS — I872 Venous insufficiency (chronic) (peripheral): Secondary | ICD-10-CM | POA: Diagnosis not present

## 2019-12-03 DIAGNOSIS — E1151 Type 2 diabetes mellitus with diabetic peripheral angiopathy without gangrene: Secondary | ICD-10-CM | POA: Diagnosis not present

## 2019-12-03 DIAGNOSIS — E1142 Type 2 diabetes mellitus with diabetic polyneuropathy: Secondary | ICD-10-CM | POA: Diagnosis not present

## 2019-12-03 DIAGNOSIS — I2510 Atherosclerotic heart disease of native coronary artery without angina pectoris: Secondary | ICD-10-CM | POA: Diagnosis not present

## 2019-12-04 ENCOUNTER — Ambulatory Visit: Payer: Self-pay | Admitting: Legal Medicine

## 2019-12-04 DIAGNOSIS — Z6841 Body Mass Index (BMI) 40.0 and over, adult: Secondary | ICD-10-CM | POA: Diagnosis not present

## 2019-12-04 DIAGNOSIS — J449 Chronic obstructive pulmonary disease, unspecified: Secondary | ICD-10-CM | POA: Diagnosis not present

## 2019-12-04 DIAGNOSIS — E1151 Type 2 diabetes mellitus with diabetic peripheral angiopathy without gangrene: Secondary | ICD-10-CM | POA: Diagnosis not present

## 2019-12-04 DIAGNOSIS — Z86718 Personal history of other venous thrombosis and embolism: Secondary | ICD-10-CM | POA: Diagnosis not present

## 2019-12-04 DIAGNOSIS — R0602 Shortness of breath: Secondary | ICD-10-CM | POA: Diagnosis not present

## 2019-12-04 DIAGNOSIS — I129 Hypertensive chronic kidney disease with stage 1 through stage 4 chronic kidney disease, or unspecified chronic kidney disease: Secondary | ICD-10-CM | POA: Diagnosis not present

## 2019-12-04 DIAGNOSIS — J9601 Acute respiratory failure with hypoxia: Secondary | ICD-10-CM | POA: Diagnosis not present

## 2019-12-04 DIAGNOSIS — I2510 Atherosclerotic heart disease of native coronary artery without angina pectoris: Secondary | ICD-10-CM | POA: Diagnosis not present

## 2019-12-04 DIAGNOSIS — R609 Edema, unspecified: Secondary | ICD-10-CM | POA: Diagnosis not present

## 2019-12-04 DIAGNOSIS — I890 Lymphedema, not elsewhere classified: Secondary | ICD-10-CM | POA: Diagnosis not present

## 2019-12-04 DIAGNOSIS — R52 Pain, unspecified: Secondary | ICD-10-CM | POA: Diagnosis not present

## 2019-12-04 DIAGNOSIS — L03116 Cellulitis of left lower limb: Secondary | ICD-10-CM | POA: Diagnosis not present

## 2019-12-04 DIAGNOSIS — N289 Disorder of kidney and ureter, unspecified: Secondary | ICD-10-CM | POA: Diagnosis not present

## 2019-12-04 DIAGNOSIS — R778 Other specified abnormalities of plasma proteins: Secondary | ICD-10-CM | POA: Diagnosis not present

## 2019-12-04 DIAGNOSIS — R079 Chest pain, unspecified: Secondary | ICD-10-CM | POA: Diagnosis not present

## 2019-12-04 DIAGNOSIS — I480 Paroxysmal atrial fibrillation: Secondary | ICD-10-CM | POA: Diagnosis not present

## 2019-12-04 DIAGNOSIS — Q600 Renal agenesis, unilateral: Secondary | ICD-10-CM | POA: Diagnosis not present

## 2019-12-04 DIAGNOSIS — I517 Cardiomegaly: Secondary | ICD-10-CM | POA: Diagnosis not present

## 2019-12-04 DIAGNOSIS — E669 Obesity, unspecified: Secondary | ICD-10-CM | POA: Diagnosis not present

## 2019-12-04 DIAGNOSIS — I872 Venous insufficiency (chronic) (peripheral): Secondary | ICD-10-CM | POA: Diagnosis not present

## 2019-12-04 DIAGNOSIS — I10 Essential (primary) hypertension: Secondary | ICD-10-CM | POA: Diagnosis not present

## 2019-12-04 DIAGNOSIS — E1142 Type 2 diabetes mellitus with diabetic polyneuropathy: Secondary | ICD-10-CM | POA: Diagnosis not present

## 2019-12-04 DIAGNOSIS — I1310 Hypertensive heart and chronic kidney disease without heart failure, with stage 1 through stage 4 chronic kidney disease, or unspecified chronic kidney disease: Secondary | ICD-10-CM | POA: Diagnosis not present

## 2019-12-04 DIAGNOSIS — E1122 Type 2 diabetes mellitus with diabetic chronic kidney disease: Secondary | ICD-10-CM | POA: Diagnosis not present

## 2019-12-04 DIAGNOSIS — N1832 Chronic kidney disease, stage 3b: Secondary | ICD-10-CM | POA: Diagnosis not present

## 2019-12-04 DIAGNOSIS — Z7901 Long term (current) use of anticoagulants: Secondary | ICD-10-CM | POA: Diagnosis not present

## 2019-12-04 DIAGNOSIS — E1169 Type 2 diabetes mellitus with other specified complication: Secondary | ICD-10-CM | POA: Diagnosis not present

## 2019-12-04 DIAGNOSIS — N179 Acute kidney failure, unspecified: Secondary | ICD-10-CM | POA: Diagnosis not present

## 2019-12-04 DIAGNOSIS — E872 Acidosis: Secondary | ICD-10-CM | POA: Diagnosis not present

## 2019-12-04 DIAGNOSIS — M109 Gout, unspecified: Secondary | ICD-10-CM | POA: Diagnosis not present

## 2019-12-05 ENCOUNTER — Ambulatory Visit: Payer: Self-pay

## 2019-12-05 ENCOUNTER — Telehealth: Payer: Medicare HMO

## 2019-12-05 DIAGNOSIS — E1121 Type 2 diabetes mellitus with diabetic nephropathy: Secondary | ICD-10-CM

## 2019-12-05 DIAGNOSIS — E782 Mixed hyperlipidemia: Secondary | ICD-10-CM

## 2019-12-05 NOTE — Chronic Care Management (AMB) (Deleted)
Chronic Care Management Pharmacy  Name: Kemp Gomes  MRN: [id number] DOB: 15-Sep-1955  Chief Complaint/ HPI    Madolyn Frieze,  64 y.o. , male presents for their Follow-Up CCM visit with the clinical pharmacist via telephone due to COVID-19 Pandemic.  PCP : Lillard Anes, MD  Their chronic conditions include: HTN, COPD, Sleep Apnea, Atherosclerotic heart disease, DM, CKD, Hx of DVT/Stroke and bowel/urinary Incontinence.   Office Visits: 11/15/2019 - repeat shoulder injection due to bursitis/decreased ROM.  10/30/2019 - shoulder steroid injection given. Patient could not raise shoulder.   10/22/2019 - kenalog injection and percocet for adhesive capsulitis.   10/15/2019 - Chronic anemia, Glucose 289, kidney tests stable, low albumin, liver tests normal, triglycerides high at 290. A1c 8.9 we will continue to work on this, INR 1.9. 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 Recent gout sent him to the hospital.   10/04/2019 - pred pack 6 day.   07/25/2019 - Dr. Henrene Pastor incontinence of bladder and stool. Stop all bowel laxatives and magnesium. Referral to GI and Urology.   06/07/2019 - Patient seen for chronic disease visit. Has foot ulcer. Labs checked. CBC revealed chronic low RBC/HGB, CMP revealed GFR 43 and elevated Glucose/SCr,BUN. A1C - 8.4%. Lipid panel - TC 239, TG 418, LDL 124 HDL 41.   Consult Visit: 08/15/2019 - Dr. Lyda Jester recommended taking benefiber daily and miralax prn if no bowel movement by supper. Patient to record all BM and Miralax to report at follow-up visit in 6 weeks.   07/19/2019 - results for diabetic eye exam with no retinopathy.   Medications: Outpatient Encounter Medications as of 12/05/2019  Medication Sig   allopurinol (ZYLOPRIM) 300 MG tablet Take 1 tablet (300 mg total) by mouth daily.   atenolol-chlorthalidone (TENORETIC) 100-25 MG tablet Take 1 tablet by mouth daily.   clotrimazole-betamethasone (LOTRISONE) cream     colchicine 0.6 MG tablet Take 0.6 mg by mouth daily.   fesoterodine (TOVIAZ) 4 MG TB24 tablet Take 4 mg by mouth daily.   fluticasone (FLONASE) 50 MCG/ACT nasal spray Place 1 spray into both nostrils daily.   furosemide (LASIX) 20 MG tablet Take 20 mg by mouth daily.   gabapentin (NEURONTIN) 300 MG capsule Take 1 capsule (300 mg total) by mouth at bedtime.   hydrALAZINE (APRESOLINE) 10 MG tablet Take 2 tablets (20 mg total) by mouth 2 (two) times daily with a meal.   lisinopril (ZESTRIL) 10 MG tablet Take 1 tablet (10 mg total) by mouth daily.   metFORMIN (GLUCOPHAGE) 1000 MG tablet Take 1 tablet (1,000 mg total) by mouth 2 (two) times daily with a meal.   MITIGARE 0.6 MG CAPS    Omega-3 Fatty Acids (RA FISH OIL) 1000 MG CPDR Take by mouth.   oxyCODONE-acetaminophen (PERCOCET) 10-325 MG tablet Take 1 tablet by mouth every 6 (six) hours as needed for pain.   phenytoin (DILANTIN) 100 MG ER capsule Take 100 mg by mouth. Take three capsules twice daily   pravastatin (PRAVACHOL) 40 MG tablet Take 1 tablet (40 mg total) by mouth daily.   triamcinolone cream (KENALOG) 0.1 % APPLY TO THE AFFECTED AREA(S) TWICE DAILY AS NEEDED   warfarin (COUMADIN) 3 MG tablet Take 1 tablet (3 mg total) by mouth daily.   warfarin (COUMADIN) 4 MG tablet Take 1 tablet (4 mg total) by mouth daily.   Wheat Dextrin (BENEFIBER) POWD Take by mouth. Mix 1 tablespoonful in a drink once daily.   No facility-administered encounter medications on  file as of 12/05/2019.     Current Diagnosis/Assessment:  Goals Addressed   None     Diabetes   Recent Relevant Labs: Lab Results  Component Value Date/Time   HGBA1C 8.9 (H) 10/15/2019 10:14 AM   HGBA1C 8.4 06/07/2019 12:00 AM   HGBA1C 8.0 11/12/2018 12:00 AM   MICROALBUR 150 10/15/2019 10:05 AM   MICROALBUR 150 07/20/2017 12:00 AM     Checking BG: Weekly - patient says really painful to check so doing a few times weekly.   Recent FBG Readings:  140-150 mg/dL Patient has failed these medicatioin past: glimepiride Patient is currently uncontrolled on the following medications: Metformin 1000 mg bid  Last diabetic Foot exam: Dr. Henrene Pastor checks in office.  Last eye exam - Due now and has a card to make an appointment.   We discussed: how to recognize and treat signs of hypoglycemia. Home health nurse is also checking feet. Diarrhea issue could be improved from eating with Metformin.   Update 11/08/2019 - Blood sugar was 153 mg/dL yesterday am. Was 200 mg/dL when [facility] checked the other day. Drinks a lot of diet drinks and can't stay away from sugar. Loves potatoes and starch.   Update 12/05/2019 - Blood sugar is high. He is hospitalized currently. He is having some trembling that he wants Dr. Henrene Pastor to help him resolve this once he is discharged.   Plan Try taking metformin with food to avoid diarrhea.  Continue current medications,  Hypertension   Office blood pressures are  BP Readings from Last 3 Encounters:  11/15/19 (!) 170/80  10/30/19 (!) 150/90  10/22/19 (!) 180/70   Lab Results  Component Value Date   CREATININE 1.49 (H) 10/15/2019   Kidney Function Lab Results  Component Value Date/Time   CREATININE 1.49 (H) 10/15/2019 10:14 AM   GFRNONAA 49 (L) 10/15/2019 10:14 AM   GFRAA 57 (L) 10/15/2019 10:14 AM    Patient has failed these meds in the past: none that he can remember at this time.  Patient is currently uncontrolled on the following medications: atenolol-chlorthalidone 100-25 mg daily, furosemide 20 mg daily, hydralazine 10 mg 2tabs bid, lisinopril 10 mg daily  Patient checks BP at home several times per month  Patient home BP readings are ranging: 150/100  We discussed diet and exercise extensively. Patient admits he probably doesn't eat right. Eats bacon, sausage, eggs regularly. Admits he needs to be "off of sodium" due to legs swelling.   Update 11/08/2019: High blood pressure runs in his family. States it  has always been high. He sees Dr. Henrene Pastor 06/14 and will discuss.   Update 12/05/2019 - Patient reports blood pressure is well controlled currently in hospital.   Plan  Continue current medications. Will try to cut back on sodium intake.   Hyperlipidemia/Hx of Stroke  Hx. Of stroke. Has had some stent.   Lipid Panel     Component Value Date/Time   CHOL 140 10/15/2019 1014   TRIG 290 (H) 10/15/2019 1014   HDL 34 (L) 10/15/2019 1014   CHOLHDL 4.1 10/15/2019 1014   LDLCALC 60 10/15/2019 1014   LABVLDL 46 (H) 10/15/2019 1014     Patient has failed these meds in past: omega 3 fatty acids 1000 mg Patient is currently uncontrolled on the following medications: pravastatin 40 mg daily  We discussed:  diet and exercise extensively. We also discussed moving pravastatin to evening medications.   Update 11/08/2019: Recommend patient work to reduce fat sources in diet. Work to  control blood sugar to improve TG.   Plan  Continue current medications    And Bowel/Urinary Incontinence:    Patient has failed these meds in past: laxatives, magnesium Patient is currently controlled on the following medications: Benefiber daily, Miralax PRN - haven't used yet, Toviaz samples  We discussed:  trying metofrmin with food to improve diarrhea. Discussing with urology about generic alternative for Lisbeth Ply if continued long-term. Lisbeth Ply is new to patient and MD provided samples for now.    Update 11/08/2019 - Patient is still dealing with urinary incontinence as well as bowel issues. He will follow-up with Dr. Nila Nephew soon for results of recent test.   Plan  Continue current medications    Gout   Kidney Function Lab Results  Component Value Date/Time   CREATININE 1.49 (H) 10/15/2019 10:14 AM   GFRNONAA 49 (L) 10/15/2019 10:14 AM   GFRAA 57 (L) 10/15/2019 10:14 AM     Patient has failed these meds in past: colchicine Patient is currently uncontrolled on the following medications:    Allopurinol 300 mg daily  We discussed 11/08/2019:  Patient reports that medication seems to be improving symptoms although still having discomfort in shoulders and arms.   Plan  Continue current medications   Medication Management   Pt uses [facility] for all medications Using weekly pill box. Discussed packaging option but patient does not feel needed at this time.  Pt endorses good compliance  We discussed:  Continuing to use medication organizer to keep with good compliance. Patient is very cautious taking phenytoin as directed to avoid a seizure.   Plan  Continue current medication regimen.     Follow up: 1 month

## 2019-12-08 DIAGNOSIS — L03116 Cellulitis of left lower limb: Secondary | ICD-10-CM

## 2019-12-08 DIAGNOSIS — I10 Essential (primary) hypertension: Secondary | ICD-10-CM

## 2019-12-08 DIAGNOSIS — Z86718 Personal history of other venous thrombosis and embolism: Secondary | ICD-10-CM

## 2019-12-08 DIAGNOSIS — N289 Disorder of kidney and ureter, unspecified: Secondary | ICD-10-CM

## 2019-12-08 DIAGNOSIS — I480 Paroxysmal atrial fibrillation: Secondary | ICD-10-CM

## 2019-12-08 DIAGNOSIS — Z7901 Long term (current) use of anticoagulants: Secondary | ICD-10-CM

## 2019-12-09 DIAGNOSIS — R778 Other specified abnormalities of plasma proteins: Secondary | ICD-10-CM

## 2019-12-14 ENCOUNTER — Encounter (HOSPITAL_COMMUNITY): Payer: Self-pay | Admitting: Family Medicine

## 2019-12-14 ENCOUNTER — Inpatient Hospital Stay (HOSPITAL_COMMUNITY)
Admission: AD | Admit: 2019-12-14 | Discharge: 2019-12-27 | DRG: 682 | Disposition: A | Discharge status: Skilled Nursing Facility | Payer: Medicare HMO | Source: Other Acute Inpatient Hospital | Attending: Internal Medicine | Admitting: Internal Medicine

## 2019-12-14 DIAGNOSIS — N183 Chronic kidney disease, stage 3 unspecified: Secondary | ICD-10-CM | POA: Diagnosis present

## 2019-12-14 DIAGNOSIS — Z955 Presence of coronary angioplasty implant and graft: Secondary | ICD-10-CM

## 2019-12-14 DIAGNOSIS — E1165 Type 2 diabetes mellitus with hyperglycemia: Secondary | ICD-10-CM | POA: Diagnosis present

## 2019-12-14 DIAGNOSIS — R279 Unspecified lack of coordination: Secondary | ICD-10-CM | POA: Diagnosis not present

## 2019-12-14 DIAGNOSIS — G40A09 Absence epileptic syndrome, not intractable, without status epilepticus: Secondary | ICD-10-CM | POA: Diagnosis not present

## 2019-12-14 DIAGNOSIS — K5900 Constipation, unspecified: Secondary | ICD-10-CM | POA: Diagnosis not present

## 2019-12-14 DIAGNOSIS — Z9981 Dependence on supplemental oxygen: Secondary | ICD-10-CM

## 2019-12-14 DIAGNOSIS — Z9119 Patient's noncompliance with other medical treatment and regimen: Secondary | ICD-10-CM

## 2019-12-14 DIAGNOSIS — L03119 Cellulitis of unspecified part of limb: Secondary | ICD-10-CM

## 2019-12-14 DIAGNOSIS — J9621 Acute and chronic respiratory failure with hypoxia: Secondary | ICD-10-CM | POA: Diagnosis not present

## 2019-12-14 DIAGNOSIS — I890 Lymphedema, not elsewhere classified: Secondary | ICD-10-CM | POA: Diagnosis present

## 2019-12-14 DIAGNOSIS — Z86718 Personal history of other venous thrombosis and embolism: Secondary | ICD-10-CM

## 2019-12-14 DIAGNOSIS — R778 Other specified abnormalities of plasma proteins: Secondary | ICD-10-CM | POA: Diagnosis not present

## 2019-12-14 DIAGNOSIS — I10 Essential (primary) hypertension: Secondary | ICD-10-CM | POA: Diagnosis not present

## 2019-12-14 DIAGNOSIS — J449 Chronic obstructive pulmonary disease, unspecified: Secondary | ICD-10-CM | POA: Diagnosis present

## 2019-12-14 DIAGNOSIS — Z79891 Long term (current) use of opiate analgesic: Secondary | ICD-10-CM

## 2019-12-14 DIAGNOSIS — Z6841 Body Mass Index (BMI) 40.0 and over, adult: Secondary | ICD-10-CM | POA: Diagnosis not present

## 2019-12-14 DIAGNOSIS — I480 Paroxysmal atrial fibrillation: Secondary | ICD-10-CM | POA: Diagnosis present

## 2019-12-14 DIAGNOSIS — Z20822 Contact with and (suspected) exposure to covid-19: Secondary | ICD-10-CM | POA: Diagnosis present

## 2019-12-14 DIAGNOSIS — N1831 Chronic kidney disease, stage 3a: Secondary | ICD-10-CM | POA: Diagnosis not present

## 2019-12-14 DIAGNOSIS — I130 Hypertensive heart and chronic kidney disease with heart failure and stage 1 through stage 4 chronic kidney disease, or unspecified chronic kidney disease: Secondary | ICD-10-CM | POA: Diagnosis present

## 2019-12-14 DIAGNOSIS — Z743 Need for continuous supervision: Secondary | ICD-10-CM | POA: Diagnosis not present

## 2019-12-14 DIAGNOSIS — I313 Pericardial effusion (noninflammatory): Secondary | ICD-10-CM | POA: Diagnosis not present

## 2019-12-14 DIAGNOSIS — F419 Anxiety disorder, unspecified: Secondary | ICD-10-CM | POA: Diagnosis present

## 2019-12-14 DIAGNOSIS — Z87891 Personal history of nicotine dependence: Secondary | ICD-10-CM

## 2019-12-14 DIAGNOSIS — I5033 Acute on chronic diastolic (congestive) heart failure: Secondary | ICD-10-CM | POA: Diagnosis present

## 2019-12-14 DIAGNOSIS — I5031 Acute diastolic (congestive) heart failure: Secondary | ICD-10-CM

## 2019-12-14 DIAGNOSIS — E8809 Other disorders of plasma-protein metabolism, not elsewhere classified: Secondary | ICD-10-CM | POA: Diagnosis not present

## 2019-12-14 DIAGNOSIS — L03116 Cellulitis of left lower limb: Secondary | ICD-10-CM | POA: Diagnosis present

## 2019-12-14 DIAGNOSIS — I4891 Unspecified atrial fibrillation: Secondary | ICD-10-CM | POA: Diagnosis not present

## 2019-12-14 DIAGNOSIS — G4700 Insomnia, unspecified: Secondary | ICD-10-CM | POA: Diagnosis present

## 2019-12-14 DIAGNOSIS — E1122 Type 2 diabetes mellitus with diabetic chronic kidney disease: Secondary | ICD-10-CM | POA: Diagnosis present

## 2019-12-14 DIAGNOSIS — D631 Anemia in chronic kidney disease: Secondary | ICD-10-CM | POA: Diagnosis present

## 2019-12-14 DIAGNOSIS — N179 Acute kidney failure, unspecified: Principal | ICD-10-CM | POA: Diagnosis present

## 2019-12-14 DIAGNOSIS — G4733 Obstructive sleep apnea (adult) (pediatric): Secondary | ICD-10-CM | POA: Diagnosis present

## 2019-12-14 DIAGNOSIS — Z8249 Family history of ischemic heart disease and other diseases of the circulatory system: Secondary | ICD-10-CM

## 2019-12-14 DIAGNOSIS — E785 Hyperlipidemia, unspecified: Secondary | ICD-10-CM | POA: Diagnosis present

## 2019-12-14 DIAGNOSIS — Z79899 Other long term (current) drug therapy: Secondary | ICD-10-CM

## 2019-12-14 DIAGNOSIS — Z8673 Personal history of transient ischemic attack (TIA), and cerebral infarction without residual deficits: Secondary | ICD-10-CM

## 2019-12-14 DIAGNOSIS — E875 Hyperkalemia: Secondary | ICD-10-CM | POA: Insufficient documentation

## 2019-12-14 DIAGNOSIS — Z7984 Long term (current) use of oral hypoglycemic drugs: Secondary | ICD-10-CM

## 2019-12-14 DIAGNOSIS — E1151 Type 2 diabetes mellitus with diabetic peripheral angiopathy without gangrene: Secondary | ICD-10-CM | POA: Diagnosis present

## 2019-12-14 DIAGNOSIS — I872 Venous insufficiency (chronic) (peripheral): Secondary | ICD-10-CM | POA: Diagnosis present

## 2019-12-14 DIAGNOSIS — Z7289 Other problems related to lifestyle: Secondary | ICD-10-CM

## 2019-12-14 DIAGNOSIS — Z7901 Long term (current) use of anticoagulants: Secondary | ICD-10-CM

## 2019-12-14 DIAGNOSIS — Z885 Allergy status to narcotic agent status: Secondary | ICD-10-CM

## 2019-12-14 DIAGNOSIS — R5381 Other malaise: Secondary | ICD-10-CM | POA: Diagnosis not present

## 2019-12-14 DIAGNOSIS — J9611 Chronic respiratory failure with hypoxia: Secondary | ICD-10-CM | POA: Diagnosis not present

## 2019-12-14 DIAGNOSIS — N1832 Chronic kidney disease, stage 3b: Secondary | ICD-10-CM | POA: Diagnosis present

## 2019-12-14 DIAGNOSIS — J9610 Chronic respiratory failure, unspecified whether with hypoxia or hypercapnia: Secondary | ICD-10-CM | POA: Diagnosis present

## 2019-12-14 DIAGNOSIS — E873 Alkalosis: Secondary | ICD-10-CM | POA: Diagnosis not present

## 2019-12-14 HISTORY — DX: Cellulitis of unspecified part of limb: L03.119

## 2019-12-14 HISTORY — DX: Unspecified atrial fibrillation: I48.91

## 2019-12-14 LAB — GLUCOSE, CAPILLARY
Glucose-Capillary: 195 mg/dL — ABNORMAL HIGH (ref 70–99)
Glucose-Capillary: 195 mg/dL — ABNORMAL HIGH (ref 70–99)
Glucose-Capillary: 258 mg/dL — ABNORMAL HIGH (ref 70–99)

## 2019-12-14 LAB — COMPREHENSIVE METABOLIC PANEL
ALT: 9 U/L (ref 0–44)
AST: 14 U/L — ABNORMAL LOW (ref 15–41)
Albumin: 2.2 g/dL — ABNORMAL LOW (ref 3.5–5.0)
Alkaline Phosphatase: 183 U/L — ABNORMAL HIGH (ref 38–126)
Anion gap: 13 (ref 5–15)
BUN: 52 mg/dL — ABNORMAL HIGH (ref 8–23)
CO2: 33 mmol/L — ABNORMAL HIGH (ref 22–32)
Calcium: 8.2 mg/dL — ABNORMAL LOW (ref 8.9–10.3)
Chloride: 89 mmol/L — ABNORMAL LOW (ref 98–111)
Creatinine, Ser: 2.45 mg/dL — ABNORMAL HIGH (ref 0.61–1.24)
GFR calc Af Amer: 31 mL/min — ABNORMAL LOW (ref >60)
GFR calc non Af Amer: 27 mL/min — ABNORMAL LOW (ref >60)
Glucose, Bld: 195 mg/dL — ABNORMAL HIGH (ref 70–99)
Potassium: 4.3 mmol/L (ref 3.5–5.1)
Sodium: 135 mmol/L (ref 135–145)
Total Bilirubin: 0.5 mg/dL (ref 0.3–1.2)
Total Protein: 7 g/dL (ref 6.5–8.1)

## 2019-12-14 LAB — CBC WITH DIFFERENTIAL/PLATELET
Abs Immature Granulocytes: 0.07 10*3/uL (ref 0.00–0.07)
Basophils Absolute: 0.1 10*3/uL (ref 0.0–0.1)
Basophils Relative: 0 %
Eosinophils Absolute: 0.2 10*3/uL (ref 0.0–0.5)
Eosinophils Relative: 1 %
HCT: 30.1 % — ABNORMAL LOW (ref 39.0–52.0)
Hemoglobin: 9.3 g/dL — ABNORMAL LOW (ref 13.0–17.0)
Immature Granulocytes: 1 %
Lymphocytes Relative: 6 %
Lymphs Abs: 1 10*3/uL (ref 0.7–4.0)
MCH: 28.9 pg (ref 26.0–34.0)
MCHC: 30.9 g/dL (ref 30.0–36.0)
MCV: 93.5 fL (ref 80.0–100.0)
Monocytes Absolute: 1.4 10*3/uL — ABNORMAL HIGH (ref 0.1–1.0)
Monocytes Relative: 9 %
Neutro Abs: 12.2 10*3/uL — ABNORMAL HIGH (ref 1.7–7.7)
Neutrophils Relative %: 83 %
Platelets: 277 10*3/uL (ref 150–400)
RBC: 3.22 MIL/uL — ABNORMAL LOW (ref 4.22–5.81)
RDW: 14.3 % (ref 11.5–15.5)
WBC: 14.9 10*3/uL — ABNORMAL HIGH (ref 4.0–10.5)
nRBC: 0 % (ref 0.0–0.2)

## 2019-12-14 LAB — HEMOGLOBIN A1C
Hgb A1c MFr Bld: 8.3 % — ABNORMAL HIGH (ref 4.8–5.6)
Mean Plasma Glucose: 191.51 mg/dL

## 2019-12-14 LAB — PROTIME-INR
INR: 1.9 — ABNORMAL HIGH (ref 0.8–1.2)
Prothrombin Time: 21.1 seconds — ABNORMAL HIGH (ref 11.4–15.2)

## 2019-12-14 LAB — HIV ANTIBODY (ROUTINE TESTING W REFLEX): HIV Screen 4th Generation wRfx: NONREACTIVE

## 2019-12-14 LAB — MAGNESIUM: Magnesium: 1.1 mg/dL — ABNORMAL LOW (ref 1.7–2.4)

## 2019-12-14 LAB — SARS CORONAVIRUS 2 BY RT PCR (HOSPITAL ORDER, PERFORMED IN ~~LOC~~ HOSPITAL LAB): SARS Coronavirus 2: NEGATIVE

## 2019-12-14 MED ORDER — SENNOSIDES-DOCUSATE SODIUM 8.6-50 MG PO TABS: 1.0000 | ORAL_TABLET | Freq: Every evening | ORAL | Status: DC | PRN

## 2019-12-14 MED ORDER — ONDANSETRON HCL 4 MG PO TABS: 4.0000 mg | ORAL_TABLET | Freq: Four times a day (QID) | ORAL | Status: DC | PRN

## 2019-12-14 MED ORDER — SODIUM CHLORIDE 0.9% FLUSH
3.0000 mL | Freq: Two times a day (BID) | INTRAVENOUS | Status: DC
  Administered 2019-12-14 – 2019-12-15 (×4): 3 mL via INTRAVENOUS

## 2019-12-14 MED ORDER — WARFARIN SODIUM 4 MG PO TABS
4.0000 mg | ORAL_TABLET | Freq: Once | ORAL | Status: AC
  Administered 2019-12-14: 4 mg via ORAL
  Filled 2019-12-14: qty 1

## 2019-12-14 MED ORDER — ACETAMINOPHEN 325 MG PO TABS
650.0000 mg | ORAL_TABLET | Freq: Four times a day (QID) | ORAL | Status: DC | PRN
  Administered 2019-12-19: 650 mg via ORAL
  Filled 2019-12-14: qty 2

## 2019-12-14 MED ORDER — NITROGLYCERIN 0.4 MG SL SUBL
SUBLINGUAL_TABLET | SUBLINGUAL | Status: AC
  Administered 2019-12-14: 0.4 mg
  Filled 2019-12-14: qty 1

## 2019-12-14 MED ORDER — DILTIAZEM HCL 60 MG PO TABS
60.0000 mg | ORAL_TABLET | Freq: Three times a day (TID) | ORAL | Status: DC
  Administered 2019-12-14 (×2): 60 mg via ORAL
  Filled 2019-12-14 (×2): qty 1

## 2019-12-14 MED ORDER — WARFARIN - PHARMACIST DOSING INPATIENT: Freq: Every day | Status: DC

## 2019-12-14 MED ORDER — DILTIAZEM HCL-DEXTROSE 125-5 MG/125ML-% IV SOLN (PREMIX)
5.0000 mg/h | INTRAVENOUS | Status: DC
  Administered 2019-12-14: 5 mg/h via INTRAVENOUS
  Filled 2019-12-14: qty 125

## 2019-12-14 MED ORDER — INSULIN ASPART 100 UNIT/ML ~~LOC~~ SOLN
0.0000 [IU] | Freq: Three times a day (TID) | SUBCUTANEOUS | Status: DC
  Administered 2019-12-14: 2 [IU] via SUBCUTANEOUS
  Administered 2019-12-14: 7 [IU] via SUBCUTANEOUS
  Administered 2019-12-15 – 2019-12-16 (×3): 3 [IU] via SUBCUTANEOUS
  Administered 2019-12-16: 5 [IU] via SUBCUTANEOUS
  Administered 2019-12-16 – 2019-12-17 (×2): 2 [IU] via SUBCUTANEOUS
  Administered 2019-12-17 (×2): 3 [IU] via SUBCUTANEOUS
  Administered 2019-12-18 – 2019-12-19 (×5): 2 [IU] via SUBCUTANEOUS
  Administered 2019-12-19 – 2019-12-20 (×2): 1 [IU] via SUBCUTANEOUS
  Administered 2019-12-20 – 2019-12-22 (×5): 2 [IU] via SUBCUTANEOUS
  Administered 2019-12-22: 3 [IU] via SUBCUTANEOUS
  Administered 2019-12-23 – 2019-12-25 (×8): 2 [IU] via SUBCUTANEOUS
  Administered 2019-12-26: 3 [IU] via SUBCUTANEOUS
  Administered 2019-12-26: 2 [IU] via SUBCUTANEOUS
  Administered 2019-12-26 – 2019-12-27 (×2): 1 [IU] via SUBCUTANEOUS
  Administered 2019-12-27: 2 [IU] via SUBCUTANEOUS

## 2019-12-14 MED ORDER — AMIODARONE HCL 200 MG PO TABS
200.0000 mg | ORAL_TABLET | Freq: Two times a day (BID) | ORAL | Status: DC
  Administered 2019-12-14: 200 mg via ORAL
  Filled 2019-12-14: qty 1

## 2019-12-14 MED ORDER — AMIODARONE HCL IN DEXTROSE 360-4.14 MG/200ML-% IV SOLN: 30.0000 mg/h | INTRAVENOUS | Status: DC

## 2019-12-14 MED ORDER — AMIODARONE HCL IN DEXTROSE 360-4.14 MG/200ML-% IV SOLN
60.0000 mg/h | INTRAVENOUS | Status: DC
  Administered 2019-12-14: 60 mg/h via INTRAVENOUS
  Filled 2019-12-14: qty 200

## 2019-12-14 MED ORDER — ACETAMINOPHEN 650 MG RE SUPP: 650.0000 mg | Freq: Four times a day (QID) | RECTAL | Status: DC | PRN

## 2019-12-14 MED ORDER — INSULIN ASPART 100 UNIT/ML ~~LOC~~ SOLN
0.0000 [IU] | Freq: Every day | SUBCUTANEOUS | Status: DC
  Administered 2019-12-14: 3 [IU] via SUBCUTANEOUS

## 2019-12-14 MED ORDER — PHENYTOIN SODIUM EXTENDED 100 MG PO CAPS
300.0000 mg | ORAL_CAPSULE | Freq: Two times a day (BID) | ORAL | Status: DC
  Administered 2019-12-14 – 2019-12-22 (×17): 300 mg via ORAL
  Filled 2019-12-14 (×18): qty 3

## 2019-12-14 MED ORDER — AMIODARONE HCL 200 MG PO TABS
200.0000 mg | ORAL_TABLET | Freq: Two times a day (BID) | ORAL | Status: DC
  Administered 2019-12-14 – 2019-12-15 (×3): 200 mg via ORAL
  Filled 2019-12-14 (×3): qty 1

## 2019-12-14 MED ORDER — NITROGLYCERIN 0.4 MG SL SUBL
SUBLINGUAL_TABLET | SUBLINGUAL | Status: AC
  Administered 2019-12-14: 0.4 mg
  Filled 2019-12-14: qty 2

## 2019-12-14 MED ORDER — SODIUM CHLORIDE 0.9% FLUSH
3.0000 mL | INTRAVENOUS | Status: DC | PRN
  Administered 2019-12-27: 3 mL via INTRAVENOUS

## 2019-12-14 MED ORDER — HYDRALAZINE HCL 50 MG PO TABS
50.0000 mg | ORAL_TABLET | Freq: Four times a day (QID) | ORAL | Status: DC | PRN
  Administered 2019-12-15 – 2019-12-17 (×2): 50 mg via ORAL
  Filled 2019-12-14 (×3): qty 1

## 2019-12-14 MED ORDER — MAGNESIUM SULFATE 4 GM/100ML IV SOLN
4.0000 g | Freq: Once | INTRAVENOUS | Status: AC
  Administered 2019-12-14: 4 g via INTRAVENOUS
  Filled 2019-12-14: qty 100

## 2019-12-14 MED ORDER — ONDANSETRON HCL 4 MG/2ML IJ SOLN: 4.0000 mg | Freq: Four times a day (QID) | INTRAMUSCULAR | Status: DC | PRN

## 2019-12-14 MED ORDER — SODIUM CHLORIDE 0.9 % IV SOLN: 250.0000 mL | INTRAVENOUS | Status: DC | PRN

## 2019-12-14 MED ORDER — SODIUM CHLORIDE 0.9% FLUSH
3.0000 mL | Freq: Two times a day (BID) | INTRAVENOUS | Status: DC
  Administered 2019-12-14 – 2019-12-26 (×23): 3 mL via INTRAVENOUS

## 2019-12-14 MED ORDER — HYDROCODONE-ACETAMINOPHEN 5-325 MG PO TABS
1.0000 | ORAL_TABLET | Freq: Four times a day (QID) | ORAL | Status: DC | PRN
  Administered 2019-12-17 (×2): 1 via ORAL
  Filled 2019-12-14 (×2): qty 1

## 2019-12-14 MED ORDER — ALBUTEROL SULFATE (2.5 MG/3ML) 0.083% IN NEBU: 3.0000 mL | INHALATION_SOLUTION | Freq: Four times a day (QID) | RESPIRATORY_TRACT | Status: DC | PRN

## 2019-12-14 MED ORDER — HYDRALAZINE HCL 50 MG PO TABS
50.0000 mg | ORAL_TABLET | Freq: Three times a day (TID) | ORAL | Status: DC
  Administered 2019-12-14 – 2019-12-16 (×6): 50 mg via ORAL
  Filled 2019-12-14 (×7): qty 1

## 2019-12-14 NOTE — Progress Notes (Signed)
Patient in afib/ SB/SR at shift change with episodes of vtach overnight. CCMD called at 1409 to make aware of elevated HR. EKG done showing afib with RVR with rates 120's-170.  MD called and patient and orders given. 1445 patient complained of chest pain like severe acid indigestion 10/10. SL nitroglycerin given at 1449 and 1456 with relief from chest pain.  Patient son in room and patient upset that "my son being upset is making me upset". Educated son and patient on afib and medications. Son wanted me to talk with his brother on phone while patient having chest pain. Explained that patient needed to be taken care of first. Son did "facetime" with family and stayed for a while then had to leave. Cardiazem gtt started until new orders from cardiology. No changes noted with card gtt. Started amiodarone gtt at 1716 and stopped at 1740 when patient converted to SB in 40's. Called MD on call and received orders to resume PO amiodarone tonight.

## 2019-12-14 NOTE — Progress Notes (Signed)
°  Amiodarone Drug - Drug Interaction Consult Note  Recommendations: -Pharmacy will monitor the INR on warfarin -May need to consider rechecking a phenytoin level based on the length of therapy  Amiodarone is metabolized by the cytochrome P450 system and therefore has the potential to cause many drug interactions. Amiodarone has an average plasma half-life of 50 days (range 20 to 100 days).   There is potential for drug interactions to occur several weeks or months after stopping treatment and the onset of drug interactions may be slow after initiating amiodarone.   []  Statins: Increased risk of myopathy. Simvastatin- restrict dose to 20mg  daily. Other statins: counsel patients to report any muscle pain or weakness immediately.  [x]  Anticoagulants: Amiodarone can increase anticoagulant effect. Consider warfarin dose reduction. Patients should be monitored closely and the dose of anticoagulant altered accordingly, remembering that amiodarone levels take several weeks to stabilize.  [x]  Antiepileptics: Amiodarone can increase plasma concentration of phenytoin, the dose should be reduced. Note that small changes in phenytoin dose can result in large changes in levels. Monitor patient and counsel on signs of toxicity.  []  Beta blockers: increased risk of bradycardia, AV block and myocardial depression. Sotalol - avoid concomitant use.  []   Calcium channel blockers (diltiazem and verapamil): increased risk of bradycardia, AV block and myocardial depression.  []   Cyclosporine: Amiodarone increases levels of cyclosporine. Reduced dose of cyclosporine is recommended.  []  Digoxin dose should be halved when amiodarone is started.  []  Diuretics: increased risk of cardiotoxicity if hypokalemia occurs.  []  Oral hypoglycemic agents (glyburide, glipizide, glimepiride): increased risk of hypoglycemia. Patient's glucose levels should be monitored closely when initiating amiodarone therapy.   []  Drugs that  prolong the QT interval:  Torsades de pointes risk may be increased with concurrent use - avoid if possible.  Monitor QTc, also keep magnesium/potassium WNL if concurrent therapy can't be avoided.  Antibiotics: e.g. fluoroquinolones, erythromycin.  Antiarrhythmics: e.g. quinidine, procainamide, disopyramide, sotalol.  Antipsychotics: e.g. phenothiazines, haloperidol.   Lithium, tricyclic antidepressants, and methadone. Thank You,   Hildred Laser, PharmD Clinical Pharmacist **Pharmacist phone directory can now be found on [email or web address] ([email or web address]).  Listed under [place].

## 2019-12-14 NOTE — Progress Notes (Signed)
Pt arrived from [place] [facility] via [facility], Bp 241'H systolic, HR 91-44 with pac's MD on the floor aware, CHG bath done call bell with reach tele placed

## 2019-12-14 NOTE — Consult Note (Addendum)
Cardiology Consultation:   Patient ID: Matthew Gutierrez MRN: [id number]; DOB: 1955-11-07  Admit date: 12/14/2019 Date of Consult: 12/14/2019  Primary Care Provider: Lillard Anes, MD Primary Cardiologist: Jenne Campus, MD Primary Electrophysiologist:  None    Patient Profile:   Matthew Gutierrez is a 64 y.o. male with a hx of HTN, HLD, DM, COPD, [place], CVA, DVT who is being seen today for the evaluation of atrial fibrillation with RVr at the request of [name], MD.  History of Present Illness:   Matthew Gutierrez is a 64yo morbidly obese male with a hx of HTN, HLD, DM, COPE, CKD, seizure d/x with CVA, DVT on chronic anticoagulation with warfarin who was admitted to [facility] ER with complaints of LE edema.  He was dx with cellulitis and admitted to [facility] for antbx therapy.  He developed afib with RVR while working with PT and also has some burning in his chest earlier that am but pressing on his chest made the discomfort worse.  He did not feel the afib at that time.  He has never had palpitations and denies any SOB, PND, orthopnea, DOE.    He was seen by Cardiology and EKG showed SB with ST depression in 1 and aVL and poor R wave progression in the anterior leads with repeat EKG at time of afib showing afib with RVr and no acute ST changes. He was started on IV Cardizem gtt after bolus .  CHADS2VASC score 4 and is chronically anticoagulated with coumadin for his DVT.  2D echo showed normal LVF with EF 55% with moderate LVH, but limited images due to poor window with no apica or subcostal views and limited SSN views.  Valves were not well visualized. Trop minimally elevated at 0.06>0.11>0.13>0.11.  He converted to NSR and was changed to PO Cardizem.  Mildly elevated Trop was felt to be related to demand ischemia from afib with RVR and not ACS with recommendations to get outpt nuclear stress test.   He had recurrent episodes of afib with RVr and when in NSR would have HR in the 40's so CCB dose  was decreased.  He was also started on Imdur.  He then had more afib along with a post termination long pause so Carvedilol was stopped and he was placed on IV Amio.  This was subsequently stopped due to bradycardia but placed on very low dose of Amio orally.    He developed AKI with creatinine up to 4.8.  Due to worsening renal function, he was transferred to [facility] for further treatment by renal.  We are now asked to evaluate for afib with RVR. He currently is in afib with RVR at 120bpm.  He denies any current chest pain, SOB, DOE, dizziness, palpitations.    Past Medical History:  Diagnosis Date  . Epilepsy, grand mal ([place])   . Frozen shoulder    Right Shoulder  . Lumbago   . Peripheral vascular disease ([place])   . Stroke [facility])     Past Surgical History:  Procedure Laterality Date  . CORONARY STENT PLACEMENT       Home Medications:  Prior to Admission medications   Medication Sig Start Date End Date Taking? Authorizing Provider  allopurinol (ZYLOPRIM) 300 MG tablet Take 1 tablet (300 mg total) by mouth daily. 11/18/19  Yes Lillard Anes, MD  atenolol-chlorthalidone (TENORETIC) 100-25 MG tablet Take 1 tablet by mouth daily. 11/18/19  Yes Lillard Anes, MD  fesoterodine (TOVIAZ) 4 MG TB24 tablet Take 4  mg by mouth daily.   Yes [provider]  furosemide (LASIX) 20 MG tablet Take 20 mg by mouth daily.   Yes [provider]  gabapentin (NEURONTIN) 300 MG capsule Take 1 capsule (300 mg total) by mouth at bedtime. 11/18/19  Yes Lillard Anes, MD  hydrALAZINE (APRESOLINE) 10 MG tablet Take 2 tablets (20 mg total) by mouth 2 (two) times daily with a meal. 08/26/19  Yes Lillard Anes, MD  lisinopril (ZESTRIL) 10 MG tablet Take 1 tablet (10 mg total) by mouth daily. 11/18/19  Yes Lillard Anes, MD  metFORMIN (GLUCOPHAGE) 1000 MG tablet Take 1 tablet (1,000 mg total) by mouth 2 (two) times daily with a meal. 11/18/19  Yes Lillard Anes, MD  pravastatin (PRAVACHOL) 40 MG tablet Take 1 tablet (40 mg total) by mouth daily. 11/18/19  Yes Lillard Anes, MD  triamcinolone cream (KENALOG) 0.1 % APPLY TO THE AFFECTED AREA(S) TWICE DAILY AS NEEDED Patient taking differently: Apply 1 application topically 2 (two) times daily as needed (irritation).  11/16/19  Yes Lillard Anes, MD  warfarin (COUMADIN) 4 MG tablet Take 1 tablet (4 mg total) by mouth daily. Patient taking differently: Take 4 mg by mouth daily at 4 PM.  07/25/19  Yes Lillard Anes, MD  fluticasone [facility]) 50 MCG/ACT nasal spray Place 1 spray into both nostrils daily. 10/30/19   Lillard Anes, MD  oxyCODONE-acetaminophen (PERCOCET) 10-325 MG tablet Take 1 tablet by mouth every 6 (six) hours as needed for pain. Patient not taking: Reported on 12/14/2019 10/22/19   Lillard Anes, MD  warfarin (COUMADIN) 3 MG tablet Take 1 tablet (3 mg total) by mouth daily. Patient not taking: Reported on 12/14/2019 07/24/19   Lillard Anes, MD    Inpatient Medications: Scheduled Meds: . amiodarone  200 mg Oral BID  . hydrALAZINE  50 mg Oral Q8H  . insulin aspart  0-5 Units Subcutaneous QHS  . insulin aspart  0-9 Units Subcutaneous TID WC  . phenytoin  300 mg Oral BID  . sodium chloride flush  3 mL Intravenous Q12H  . sodium chloride flush  3 mL Intravenous Q12H  . warfarin  4 mg Oral ONCE-1600  . Warfarin - Pharmacist Dosing Inpatient   Does not apply q1600   Continuous Infusions: . sodium chloride    . diltiazem (CARDIZEM) infusion 10 mg/hr (12/14/19 1540)   PRN Meds: sodium chloride, acetaminophen **OR** acetaminophen, albuterol, hydrALAZINE, HYDROcodone-acetaminophen, ondansetron **OR** ondansetron (ZOFRAN) IV, senna-docusate, sodium chloride flush  Allergies:    Allergies  Allergen Reactions  . Codeine     Social History:   Social History   Socioeconomic History  . Marital status: Divorced    Spouse name: Not on  file  . Number of children: 2  . Years of education: Not on file  . Highest education level: Not on file  Occupational History  . Occupation: Disability  Tobacco Use  . Smoking status: Former Smoker    Quit date: 10/28/2009    Years since quitting: 10.1  . Smokeless tobacco: Never Used  Substance and Sexual Activity  . Alcohol use: Yes    Comment: occassionally  . Drug use: No  . Sexual activity: Not Currently  Other Topics Concern  . Not on file  Social History Narrative  . Not on file   Social Determinants of Health   Financial Resource Strain:   . Difficulty of Paying Living Expenses:   Food Insecurity:   .  Worried About Charity fundraiser in the Last Year:   . Arboriculturist in the Last Year:   Transportation Needs:   . Film/video editor (Medical):   Marland Kitchen Lack of Transportation (Non-Medical):   Physical Activity: Inactive  . Days of Exercise per Week: 0 days  . Minutes of Exercise per Session: 0 min  Stress:   . Feeling of Stress :   Social Connections:   . Frequency of Communication with Friends and Family:   . Frequency of Social Gatherings with Friends and Family:   . Attends Religious Services:   . Active Member of Clubs or Organizations:   . Attends Archivist Meetings:   Marland Kitchen Marital Status:   Intimate Partner Violence: Not At Risk  . Fear of Current or Ex-Partner: No  . Emotionally Abused: No  . Physically Abused: No  . Sexually Abused: No    Family History:    Family History  Problem Relation Age of Onset  . Cancer Mother   . Stroke Father   . Hypertension Other   . Stroke Other   . Hyperlipidemia Other   . Alcohol abuse Other   . Heart disease Other   . Cancer Other      ROS:  Please see the history of present illness.   All other ROS reviewed and negative.     Physical Exam/Data:   Vitals:   12/14/19 1445 12/14/19 1450 12/14/19 1500 12/14/19 1515  BP: (!) 150/89 (!) 169/88 (!) 153/72 (!) 142/67  Pulse:      Resp:   (!)  25 (!) 24  Temp:   (!) 97.5 F (36.4 C) 98.2 F (36.8 C)  TempSrc:   Oral Oral  SpO2: 93% 93% 94% 93%    Intake/Output Summary (Last 24 hours) at 12/14/2019 1541 Last data filed at 12/14/2019 0452 Gross per 24 hour  Intake --  Output 550 ml  Net -550 ml   Last 3 Weights 11/15/2019 10/30/2019 10/22/2019  Weight (lbs) 322 lb 315 lb 315 lb 3.2 oz  Weight (kg) 146.058 kg 142.883 kg 142.974 kg     There is no height or weight on file to calculate BMI.  General:  Well nourished, well developed, in no acute distress.  Morbidly obese HEENT: normal Lymph: no adenopathy Neck: no JVD Endocrine:  No thryomegaly Vascular: No carotid bruits; FA pulses 2+ bilaterally without bruits  Cardiac:  normal S1, S2; irregularly irregular and tachy; no murmur  Lungs:  clear to auscultation bilaterally, no wheezing, rhonchi or rales  Abd: soft, nontender, no hepatomegaly  Ext: legs wrapped bilaterally Musculoskeletal:  No deformities, BUE and BLE strength normal and equal Skin: warm and dry  Neuro:  CNs 2-12 intact, no focal abnormalities noted Psych:  Normal affect   EKG:  The EKG was personally reviewed and demonstrates:  Atrial fibrillation with RvR Telemetry:  Telemetry was personally reviewed and demonstrates:  Atrial fibrillation with RVR in the 120's  Relevant CV Studies: 2D echo see dictation  Laboratory Data:  High Sensitivity Troponin:  No results for input(s): TROPONINIHS in the last 720 hours.   Chemistry Recent Labs  Lab 12/14/19 0233  NA 135  K 4.3  CL 89*  CO2 33*  GLUCOSE 195*  BUN 52*  CREATININE 2.45*  CALCIUM 8.2*  GFRNONAA 27*  GFRAA 31*  ANIONGAP 13    Recent Labs  Lab 12/14/19 0233  PROT 7.0  ALBUMIN 2.2*  AST 14*  ALT 9  ALKPHOS 183*  BILITOT 0.5   Hematology Recent Labs  Lab 12/14/19 0233  WBC 14.9*  RBC 3.22*  HGB 9.3*  HCT 30.1*  MCV 93.5  MCH 28.9  MCHC 30.9  RDW 14.3  PLT 277   BNPNo results for input(s): BNP, PROBNP in the last 168  hours.  DDimer No results for input(s): DDIMER in the last 168 hours.   Radiology/Studies:  No results found.   Assessment and Plan:   1. Atrial fibrillation with RVR -he has been in and out of afib with RVR since admission to [place] -likely driven but infection. -initially on IV Cardizem gtt then converted to NSR>had more afib and started on Carvedilol>recurrent bouts of afib resulted in addition of IV Amio gtt but then got brady in NSR and BB stopped and changed to oral Amio 200mg  BID -now back in afib with RVR -he likely needs an IV load of Amio so will stop PO and place back on gtt -he may have tachybrady syndrome and may ultimately need PPM if we cannot maintain NSR and HR control on Amio without bradyarrhythmias.  -will just try to manage RVR with IV Amio due to brady on CCB and BB when in NSR -continue Coumadin per pharmacy  2.  Elevated troponin -Trop mildly elevated at 0.06>0.11>0.13>0.11 - this likely does not represent ACS and likely demand ischemia in the setting of afib with RVR, AKI, CHF - 2D echo was poor quality with low normal LVF - will reassess echo here with definity once HR controlled - he is anticoagulated with comadin - check FLP -he had some vague CP in the hospital but better when rubbing his chest -would recommend nuclear stress test as outpt>not candidate for cath now due to AKI  3.  Acute diastolic CHF -likely related to acute illness with infection, AKI and afib with RVR -diuretics now on hold due to worsening renal function -he does not appear volume overloaded  4.  LLE cellulitis -antibx per [facility]  5.  AKI -worsening renal function -Creat peaked at 4.8 and 2.45 today with baseline around 1.8 -US kidneys normal  6..  COPD -on 2L O2 at home -per [facility] -to be evaluated by renal      For questions or updates, please contact [facility] Please consult [email or web address] for contact info under     Signed, Fransico Him, MD  12/14/2019 3:41  PM

## 2019-12-14 NOTE — Progress Notes (Signed)
ANTICOAGULATION/DILANTIN CONSULT NOTE - Initial Consult  Pharmacy Consult for Warfarin and Dilantin Indication: h/o DVT and new afib and epilepsy  Allergies  Allergen Reactions  . Codeine     Patient Measurements:    Vital Signs: Temp: 98.4 F (36.9 C) (07/17 0059) Temp Source: Oral (07/17 0059) BP: 190/77 (07/17 0059) Pulse Rate: 76 (07/17 0059)  Labs: No results for input(s): HGB, HCT, PLT, APTT, LABPROT, INR, HEPARINUNFRC, HEPRLOWMOCWT, CREATININE, CKTOTAL, CKMB, TROPONINIHS in the last 72 hours.  CrCl cannot be calculated (Patient's most recent lab result is older than the maximum 21 days allowed.).   Medical History: Past Medical History:  Diagnosis Date  . Epilepsy, grand mal ([place])   . Frozen shoulder    Right Shoulder  . Lumbago   . Peripheral vascular disease ([place])   . Stroke [facility])     Medications:  Await electronic med rec  Assessment: 64 y.o. M presents from [place] consulted for Coumadin and Dilantin dosing.  AC: Pt on coumadin PTA for h/o DVT. Home dose 4mg  daily with admission INR at [facility] 2.6. **noted pt started amiodarone during [facility] stay which may potentiate effects of warfarin** Dosing history from [facility]: 7/7-7/11 4mg  daily 7/12 Dose held as INR >3 7/13 2mg  7/14 2mg   7/15 4mg   INR 1.8 Lovenox bridge started 7/16 4mg . Lovenox 150mg  BID - last given 7/16 2200 7/17 INR at [facility] 1.9 (slightly subtherapeutic).   Neuro: Home dose of Dilantin 300mg  BID - continued at [place]. Last dose 7/16 pm. No seizures at [facility] checked at [facility] all therapeutic - last level checked was 12.1 on 7/16  Goal of Therapy:  INR 2-3; Dilatin level 10-20 mcg/ml Monitor platelets by anticoagulation protocol: Yes   Plan:  Daily INR Coumadin 4mg  po tonight. If INR not >2 in a.m., consider addition of Lovenox bridge Dilantin 300mg  po BID  Sherlon Handing, PharmD, BCPS Please see amion for complete clinical pharmacist phone  list 12/14/2019,2:16 AM

## 2019-12-14 NOTE — Progress Notes (Signed)
Patient taking oxygen off throughout shift stating that everything was pulling at him or in his way. Patient upset that oxygen tubing, pulse ox probe and blood pressure cuff were in his way. Patient getting upset that he could not get television controller to work properly. Pt blood pressure elevated and patient did not want to be messed with until we could fix his television. PM nurse aware and called facilities. Pt resting with call bell within reach but unhappy that he has many things on him.

## 2019-12-14 NOTE — Progress Notes (Signed)
Patient is a 64 year old male with history of DVT on Coumadin, obesity, lymphedema, peripheral vascular disease, epilepsy, CKD stage III, hypertension who presented to the [facility] for the evaluation  of increased swelling and redness of the left lower extremity.  On presentation he was found to be hypertensive.  Chest x-ray showed prominent interstitial marking reflecting volume overload or atypical infection.  Patient was started on IV clindamycin for cellulitis of left lower extremity.  His kidney function deteriorated reaching creatinine of 4.8.  Antibiotics discontinued.  Patient was transferred to [facility] for possible nephrology consultation.  Renal function began to improve prior to transfer. His creatinine is 2.45 today.  His baseline creatinine is around 1.8.  Normal ultrasound of the kidneys.  Currently heart rate is well controlled.  He developed A. fib with RVR at [facility] which converted to sinus rhythm with amiodarone infusion.  Currently is on oral amiodarone.  He is on warfarin for anticoagulation.  He has history of COPD and uses 2 L of oxygen per minute at baseline.  Blood pressure has been stable today.  He takes phenytoin for epilepsy.  Continue sliding-scale insulin for type 2 diabetes mellitus.  He is currently not on any antibiotics for  Cellulitis which has resolved. Patient seen and examined at the bedside this morning.  Currently he is hemodynamically stable.  Denies any new complaint. Patient seen by Dr. Myna Hidalgo this morning.  I agree with assessment and plan.

## 2019-12-14 NOTE — H&P (Signed)
History and Physical    Matthew Gutierrez SWN:[id number] DOB: [birth year] DOA: 12/14/2019  PCP: Lillard Anes, MD   Patient coming from: Home, by way of [facility]   Chief Complaint: AKI on CKD III  HPI: Matthew Gutierrez is a 64 y.o. male with medical history significant for DVT on warfarin, obesity, lymphedema, peripheral vascular disease, epilepsy, chronic kidney disease stage III, and hypertension who presented to [facility] emergency department on 12/04/2019 for evaluation of increased swelling and redness involving the left lower extremity.  Patient reported several days of increasing redness and swelling involving the left leg.  He was not having any fevers or chills associated with this and did not notice any drainage.  [facility] ED and Hospital Course: Upon arrival to the ED, patient was found to be afebrile, saturating upper 90s on his usual 2 L/min of supplemental oxygen, and hypertensive to 180/80.  EKG featured a sinus rhythm.  Chest x-ray was notable for prominent interstitial markings which could reflect volume overload or atypical infection.  Chemistry panel was notable for creatinine 1.80.  CBC featured a leukocytosis to 12,500 and mild anemia.  INR was therapeutic at 2.6.  Patient was given a dose of IV clindamycin and admitted to the [facility] on 12/04/2019 for possible cellulitis of the left lower extremity.  Antibiotics were transitioned to cefazolin and later cefepime.  Renal function worsened with creatinine reaching 4.8.  Antibiotics were discontinued at that time and renal ultrasound was obtained and normal.  Patient was treated with IV fluids as well as Lasix, and given IV bicarbonate, Kayexalate, and IV calcium. Arrangements were made for transfer to [facility] for possible nephrology consultation. Renal function began to improve prior to transfer and creatinine was 2.8 on 12/12/19 without hyperkalemia or acidosis (he was on bicarbonate infusion and alkalotic).    Course was complicated by development of atrial fibrillation with rate reaching 160s.  Cardiology at [facility] was consulted, patient was started on IV diltiazem, eventually transition to IV amiodarone, and had his Coreg increased.  Coreg was later stopped due to bradycardia, patient converted to sinus rhythm, and has since been maintaining a sinus rhythm on oral amiodarone and oral diltiazem.  For his history of DVT and new atrial fibrillation, he was continued on warfarin.  Subjectively, patient reports improvement in his shortness of breath and leg swelling over the past few days, had been experiencing some chest discomfort when in atrial fibrillation but that has resolved.  Review of Systems:  All other systems reviewed and apart from HPI, are negative.  Past Medical History:  Diagnosis Date  . Epilepsy, grand mal ([place])   . Frozen shoulder    Right Shoulder  . Lumbago   . Peripheral vascular disease ([place])   . Stroke [facility])     Past Surgical History:  Procedure Laterality Date  . CORONARY STENT PLACEMENT       reports that he quit smoking about 10 years ago. He has never used smokeless tobacco. He reports current alcohol use. He reports that he does not use drugs.  Allergies  Allergen Reactions  . Codeine     Family History  Problem Relation Age of Onset  . Cancer Mother   . Stroke Father   . Hypertension Other   . Stroke Other   . Hyperlipidemia Other   . Alcohol abuse Other   . Heart disease Other   . Cancer Other      Prior to Admission medications   Medication Sig Start  Date End Date Taking? Authorizing Provider  allopurinol (ZYLOPRIM) 300 MG tablet Take 1 tablet (300 mg total) by mouth daily. 11/18/19   Lillard Anes, MD  atenolol-chlorthalidone (TENORETIC) 100-25 MG tablet Take 1 tablet by mouth daily. 11/18/19   Lillard Anes, MD  clotrimazole-betamethasone (LOTRISONE) cream  09/15/19   [provider]  colchicine 0.6 MG tablet Take  0.6 mg by mouth daily.  10/15/19  [provider]  fesoterodine (TOVIAZ) 4 MG TB24 tablet Take 4 mg by mouth daily.    [provider]  fluticasone (FLONASE) 50 MCG/ACT nasal spray Place 1 spray into both nostrils daily. 10/30/19   Lillard Anes, MD  furosemide (LASIX) 20 MG tablet Take 20 mg by mouth daily.    [provider]  gabapentin (NEURONTIN) 300 MG capsule Take 1 capsule (300 mg total) by mouth at bedtime. 11/18/19   Lillard Anes, MD  hydrALAZINE (APRESOLINE) 10 MG tablet Take 2 tablets (20 mg total) by mouth 2 (two) times daily with a meal. 08/26/19   Lillard Anes, MD  lisinopril (ZESTRIL) 10 MG tablet Take 1 tablet (10 mg total) by mouth daily. 11/18/19   Lillard Anes, MD  metFORMIN (GLUCOPHAGE) 1000 MG tablet Take 1 tablet (1,000 mg total) by mouth 2 (two) times daily with a meal. 11/18/19   Lillard Anes, MD  MITIGARE 0.6 MG CAPS  10/08/19   [provider]  Omega-3 Fatty Acids (RA FISH OIL) 1000 MG CPDR Take by mouth.    [provider]  oxyCODONE-acetaminophen (PERCOCET) 10-325 MG tablet Take 1 tablet by mouth every 6 (six) hours as needed for pain. 10/22/19   Lillard Anes, MD  phenytoin (DILANTIN) 100 MG ER capsule Take 100 mg by mouth. Take three capsules twice daily    [provider]  pravastatin (PRAVACHOL) 40 MG tablet Take 1 tablet (40 mg total) by mouth daily. 11/18/19   Lillard Anes, MD  triamcinolone cream (KENALOG) 0.1 % APPLY TO THE AFFECTED AREA(S) TWICE DAILY AS NEEDED 11/16/19   Lillard Anes, MD  warfarin (COUMADIN) 3 MG tablet Take 1 tablet (3 mg total) by mouth daily. 07/24/19   Lillard Anes, MD  warfarin (COUMADIN) 4 MG tablet Take 1 tablet (4 mg total) by mouth daily. 07/25/19   Lillard Anes, MD  Wheat Dextrin [facility]) POWD Take by mouth. Mix 1 tablespoonful in a drink once daily.    [provider]    Physical  Exam: Vitals:   12/14/19 0059  BP: (!) 190/77  Pulse: 76  Resp: 20  Temp: 98.4 F (36.9 C)  TempSrc: Oral  SpO2: 95%    Constitutional: NAD, calm  Eyes: PERTLA, lids and conjunctivae normal ENMT: Mucous membranes are moist. Posterior pharynx clear of any exudate or lesions.   Neck: normal, supple, no masses, no thyromegaly Respiratory:  no wheezing, no crackles. No accessory muscle use.  Cardiovascular: S1 & S2 heard, regular rate and rhythm. Pitting edema bilateral LEs.   Abdomen: No distension, no tenderness, soft. Bowel sounds active.  Musculoskeletal: no clubbing / cyanosis. No joint deformity upper and lower extremities.   Skin: Bilateral lower LEs are wrapped and some erythema is noted proximally and distally to the wraps without any fluctuance or drainage. Otherwise, warm dry, well-perfused. Neurologic: CN 2-12 grossly intact. Sensation intact. Moving all extremities.  Psychiatric: Alert and oriented to person, place, and situation. Pleasant and cooperative.    Labs and Imaging on Admission:  I have personally reviewed following labs and imaging studies  CBC: No results for input(s): WBC, NEUTROABS, HGB, HCT, MCV, PLT in the last 168 hours. Basic Metabolic Panel: No results for input(s): NA, K, CL, CO2, GLUCOSE, BUN, CREATININE, CALCIUM, MG, PHOS in the last 168 hours. GFR: CrCl cannot be calculated (Patient's most recent lab result is older than the maximum 21 days allowed.). Liver Function Tests: No results for input(s): AST, ALT, ALKPHOS, BILITOT, PROT, ALBUMIN in the last 168 hours. No results for input(s): LIPASE, AMYLASE in the last 168 hours. No results for input(s): AMMONIA in the last 168 hours. Coagulation Profile: No results for input(s): INR, PROTIME in the last 168 hours. Cardiac Enzymes: No results for input(s): CKTOTAL, CKMB, CKMBINDEX, TROPONINI in the last 168 hours. BNP (last 3 results) No results for input(s): PROBNP in the last 8760  hours. HbA1C: No results for input(s): HGBA1C in the last 72 hours. CBG: No results for input(s): GLUCAP in the last 168 hours. Lipid Profile: No results for input(s): CHOL, HDL, LDLCALC, TRIG, CHOLHDL, LDLDIRECT in the last 72 hours. Thyroid Function Tests: No results for input(s): TSH, T4TOTAL, FREET4, T3FREE, THYROIDAB in the last 72 hours. Anemia Panel: No results for input(s): VITAMINB12, FOLATE, FERRITIN, TIBC, IRON, RETICCTPCT in the last 72 hours. Urine analysis: No results found for: COLORURINE, APPEARANCEUR, LABSPEC, PHURINE, GLUCOSEU, HGBUR, BILIRUBINUR, KETONESUR, PROTEINUR, UROBILINOGEN, NITRITE, LEUKOCYTESUR Sepsis Labs: @LABRCNTIP (procalcitonin:4,lacticidven:4) )No results found for this or any previous visit (from the past 240 hour(s)).   Radiological Exams on Admission: No results found.  EKG: Independently reviewed. Atrial fibrillation with RVR, rate 163, LAFB.   Assessment/Plan   1. Acute kidney injury superimposed on CKD IIIa - Patient was admitted to [facility] on 7/7 for lymphedema vs LE cellulitis, developed AKI with SCr increasing from 1.8 on 12/04/19 to 4.8, antibiotics were stopped, IVF was administered, and SCr improved to 2.8 by 12/12/19  - He had normal renal US at [facility]  - Repeat chem panel now, hold Lasix for now, and renally-dose medications    2. Paroxysmal atrial fibrillation  - Patient developed atrial fibrillation with rate 160s at [facility], converted to SR with amiodarone infusion and has remained in SR on oral amiodarone  - Coreg was stopped d/t bradycardia and he has been on oral diltiazem and amiodarone which will be continued  - CHADS-VASc is 4 (CVA x2, DM, HTN), he was already on warfarin d/t hx of DVT and this will be continued     3. COPD; chronic hypoxic respiratory failure  - Patient uses 2 Lpm supplemental O2 at baseline, does not have cough or wheezing on admission  - Continue supplemental O2, as-needed albuterol   4.  Hypertension  - BP has been elevated at [facility], SBP 190 on arrival to [facility], asymptomatic  - Coreg was stopped due to bradycardia and diltiazem was started at [place] diltiazem and hydralazine    5. Epilepsy   - Continue phenytoin    6. Type II DM  - Hold metformin, check CBGs, and use a low-intensity SSI with Novolog for now    7. Acute on chronic diastolic CHF  - EF was preserved on 12/04/19  - He reports decreased swelling in the past couple days and appears compensated  - Coreg was stopped at [place] due to bradycardia  - SLIV and hold Lasix for now pending updated chem panel, monitor weight and I/Os    DVT prophylaxis: warfarin  Code Status: Full  Family Communication:  Discussed with patient  Disposition Plan:  Patient is from: Home  Anticipated d/c is to: TBD  Anticipated d/c date is: 12/17/19 Patient currently: has AKI on CKD III, may need inpatient nephrology consultation  Consults called: None  Admission status: inpatient     Vianne Bulls, MD [facility]  12/14/2019, 1:58 AM

## 2019-12-15 ENCOUNTER — Other Ambulatory Visit: Payer: Self-pay

## 2019-12-15 ENCOUNTER — Inpatient Hospital Stay (HOSPITAL_COMMUNITY): Payer: Medicare HMO

## 2019-12-15 DIAGNOSIS — I4891 Unspecified atrial fibrillation: Secondary | ICD-10-CM

## 2019-12-15 DIAGNOSIS — R778 Other specified abnormalities of plasma proteins: Secondary | ICD-10-CM

## 2019-12-15 DIAGNOSIS — I10 Essential (primary) hypertension: Secondary | ICD-10-CM | POA: Diagnosis not present

## 2019-12-15 LAB — CBC WITH DIFFERENTIAL/PLATELET
Abs Immature Granulocytes: 0.09 10*3/uL — ABNORMAL HIGH (ref 0.00–0.07)
Basophils Absolute: 0.1 10*3/uL (ref 0.0–0.1)
Basophils Relative: 0 %
Eosinophils Absolute: 0.1 10*3/uL (ref 0.0–0.5)
Eosinophils Relative: 1 %
HCT: 32 % — ABNORMAL LOW (ref 39.0–52.0)
Hemoglobin: 9.7 g/dL — ABNORMAL LOW (ref 13.0–17.0)
Immature Granulocytes: 1 %
Lymphocytes Relative: 6 %
Lymphs Abs: 0.8 10*3/uL (ref 0.7–4.0)
MCH: 29.1 pg (ref 26.0–34.0)
MCHC: 30.3 g/dL (ref 30.0–36.0)
MCV: 96.1 fL (ref 80.0–100.0)
Monocytes Absolute: 1.4 10*3/uL — ABNORMAL HIGH (ref 0.1–1.0)
Monocytes Relative: 10 %
Neutro Abs: 11.1 10*3/uL — ABNORMAL HIGH (ref 1.7–7.7)
Neutrophils Relative %: 82 %
Platelets: 257 10*3/uL (ref 150–400)
RBC: 3.33 MIL/uL — ABNORMAL LOW (ref 4.22–5.81)
RDW: 14.7 % (ref 11.5–15.5)
WBC: 13.6 10*3/uL — ABNORMAL HIGH (ref 4.0–10.5)
nRBC: 0 % (ref 0.0–0.2)

## 2019-12-15 LAB — PROTIME-INR
INR: 2.1 — ABNORMAL HIGH (ref 0.8–1.2)
Prothrombin Time: 22.4 seconds — ABNORMAL HIGH (ref 11.4–15.2)

## 2019-12-15 LAB — BASIC METABOLIC PANEL
Anion gap: 15 (ref 5–15)
BUN: 50 mg/dL — ABNORMAL HIGH (ref 8–23)
CO2: 30 mmol/L (ref 22–32)
Calcium: 8.6 mg/dL — ABNORMAL LOW (ref 8.9–10.3)
Chloride: 91 mmol/L — ABNORMAL LOW (ref 98–111)
Creatinine, Ser: 2.3 mg/dL — ABNORMAL HIGH (ref 0.61–1.24)
GFR calc Af Amer: 34 mL/min — ABNORMAL LOW (ref >60)
GFR calc non Af Amer: 29 mL/min — ABNORMAL LOW (ref >60)
Glucose, Bld: 216 mg/dL — ABNORMAL HIGH (ref 70–99)
Potassium: 4.6 mmol/L (ref 3.5–5.1)
Sodium: 136 mmol/L (ref 135–145)

## 2019-12-15 LAB — ECHOCARDIOGRAM COMPLETE
Area-P 1/2: 3.45 cm2
S' Lateral: 3.2 cm

## 2019-12-15 LAB — MAGNESIUM: Magnesium: 1.8 mg/dL (ref 1.7–2.4)

## 2019-12-15 LAB — GLUCOSE, CAPILLARY
Glucose-Capillary: 194 mg/dL — ABNORMAL HIGH (ref 70–99)
Glucose-Capillary: 225 mg/dL — ABNORMAL HIGH (ref 70–99)
Glucose-Capillary: 211 mg/dL — ABNORMAL HIGH (ref 70–99)
Glucose-Capillary: 188 mg/dL — ABNORMAL HIGH (ref 70–99)

## 2019-12-15 LAB — BRAIN NATRIURETIC PEPTIDE: B Natriuretic Peptide: 240.9 pg/mL — ABNORMAL HIGH (ref 0.0–100.0)

## 2019-12-15 MED ORDER — AMLODIPINE BESYLATE 10 MG PO TABS
10.0000 mg | ORAL_TABLET | Freq: Every day | ORAL | Status: DC
  Administered 2019-12-15 – 2019-12-27 (×13): 10 mg via ORAL
  Filled 2019-12-15 (×13): qty 1

## 2019-12-15 MED ORDER — WARFARIN SODIUM 4 MG PO TABS
4.0000 mg | ORAL_TABLET | Freq: Once | ORAL | Status: AC
  Administered 2019-12-15: 4 mg via ORAL
  Filled 2019-12-15: qty 1

## 2019-12-15 NOTE — Evaluation (Signed)
Physical Therapy Evaluation Patient Details Name: Matthew Gutierrez MRN: [id number] DOB: Feb 10, 1956 Today's Date: 12/15/2019   History of Present Illness   64 y.o. male with medical history significant for DVT on warfarin, obesity, lymphedema, peripheral vascular disease, epilepsy, chronic kidney disease stage III, and hypertension who presented to [facility] emergency department on 12/04/2019 for evaluation of increased swelling and redness involving the left lower extremity. Patient admitted [facility] 7/17 Acute kidney injury superimposed on CKD IIIa, Paroxysmal atrial fibrillation, COPD; chronic hypoxic respiratory failure.   Clinical Impression  Patient presents with generalized weakness, decreased activity tolerance, impaired balance and impaired mobility s/p above. Pt lives alone and Mod I with ADLs and ambulation PTA. Today, pt reports being in a depressed mood due to fast decline in functional mobility. "I was walking a few days ago and now I can't." Requires Mod A to stand from chair and able to take a few steps forward/backward with use of RW and assist for safety. Will need second person for chair follow to progress due to LE weakness. Sp02 dropped to 86% on 2L/min 02 [place] with activity. Would benefit from SNF to maximize independence and mobility prior to return home. Will follow acutely. Might benefit from someone to talk to re: chaplain.    Follow Up Recommendations SNF;Supervision for mobility/OOB    Equipment Recommendations  None recommended by PT    Recommendations for Other Services       Precautions / Restrictions Precautions Precautions: Fall Precaution Comments: monitor O2 Restrictions Weight Bearing Restrictions: No      Mobility  Bed Mobility               General bed mobility comments: in recliner upon arrival  Transfers Overall transfer level: Needs assistance Equipment used: Rolling walker (2 wheeled) Transfers: Sit to/from Stand Sit to Stand: Mod  assist         General transfer comment: mod A to power up to standing with cues for hand placement; able to take a few steps forward/backwards and marching with difficulty. Sp02 dropped to 86% on 2L/min 02 [employer]. Sp02 87% on RA at rest.  Ambulation/Gait             General Gait Details: Need second person for safety due to weakness/desating.  Stairs            Wheelchair Mobility    Modified Rankin (Stroke Patients Only)       Balance Overall balance assessment: Needs assistance Sitting-balance support: Feet supported;No upper extremity supported Sitting balance-Leahy Scale: Fair     Standing balance support: During functional activity Standing balance-Leahy Scale: Poor Standing balance comment: relies on walker, BLE weakness. Able to march in place clearing feet from floor minimally.                             Pertinent Vitals/Pain Pain Assessment: No/denies pain Faces Pain Scale: Hurts little more Pain Location: B LEs Pain Descriptors / Indicators: Sore Pain Intervention(s): Monitored during session    Home Living Family/patient expects to be discharged to:: Private residence Living Arrangements: Alone Available Help at Discharge: Family;Available PRN/intermittently Type of Home: Apartment Home Access: Level entry     Home Layout: One level Home Equipment: Walker - 2 wheels;Bedside commode;Cane - single point;Shower seat - built in;Grab bars - tub/shower;Other (comment);Adaptive equipment Additional Comments: leaves a walker in home and one in car    Prior Function Level of Independence: Independent with assistive  device(s)         Comments: Reports wearing 02 at night only; does own ADLs and drives, gets own groceries. Sees HH for wrapping LEs.     Hand Dominance   Dominant Hand: Right    Extremity/Trunk Assessment   Upper Extremity Assessment Upper Extremity Assessment: Defer to OT evaluation    Lower Extremity  Assessment Lower Extremity Assessment: Generalized weakness (LEs wrapped)       Communication   Communication: HOH  Cognition Arousal/Alertness: Awake/alert Behavior During Therapy: WFL for tasks assessed/performed Overall Cognitive Status: Within Functional Limits for tasks assessed                                 General Comments: Reports feeling depressed at current lack of independence, fast decline in mobility.      General Comments General comments (skin integrity, edema, etc.): Sp02 on RA at rest dropped to 86%, donned 2L for activity and still desats to 86%. Recovers quickly.    Exercises     Assessment/Plan    PT Assessment Patient needs continued PT services  PT Problem List Decreased strength;Decreased mobility;Decreased activity tolerance;Cardiopulmonary status limiting activity;Decreased balance       PT Treatment Interventions Therapeutic activities;Gait training;Therapeutic exercise;Patient/family education;Balance training;Functional mobility training    PT Goals (Current goals can be found in the Care Plan section)  Acute Rehab PT Goals Patient Stated Goal: to get back to walking PT Goal Formulation: With patient Time For Goal Achievement: 12/29/19 Potential to Achieve Goals: Good    Frequency Min 3X/week   Barriers to discharge Decreased caregiver support lives alone    Co-evaluation               AM-PAC PT "6 Clicks" Mobility  Outcome Measure Help needed turning from your back to your side while in a flat bed without using bedrails?: A Little Help needed moving from lying on your back to sitting on the side of a flat bed without using bedrails?: A Lot Help needed moving to and from a bed to a chair (including a wheelchair)?: A Lot Help needed standing up from a chair using your arms (e.g., wheelchair or bedside chair)?: A Lot Help needed to walk in hospital room?: A Lot Help needed climbing 3-5 steps with a railing? : Total 6  Click Score: 12    End of Session Equipment Utilized During Treatment: Oxygen;Gait belt Activity Tolerance: Patient tolerated treatment well;Treatment limited secondary to medical complications (Comment) (drop in Sp02) Patient left: in chair;with call bell/phone within reach Nurse Communication: Mobility status PT Visit Diagnosis: Muscle weakness (generalized) (M62.81);Difficulty in walking, not elsewhere classified (R26.2)    Time: 7341-9379 PT Time Calculation (min) (ACUTE ONLY): 20 min   Charges:   PT Evaluation $PT Eval Moderate Complexity: 1 Mod          Marisa Severin, PT, DPT Acute Rehabilitation Services Pager [phone] Office [place] 12/15/2019, 1:59 PM

## 2019-12-15 NOTE — Progress Notes (Signed)
Followed up on QTc 393, reassuring

## 2019-12-15 NOTE — Progress Notes (Signed)
PROGRESS NOTE    Matthew Gutierrez  VQQ:[id number] DOB: June 07, 1955 DOA: 12/14/2019 PCP: Lillard Anes, MD   Brief Narrative: Patient is a 64 year old male with history of DVT on Coumadin, obesity, lymphedema, peripheral vascular disease, epilepsy, CKD stage III, hypertension who presented to the [facility] for the evaluation  of increased swelling and redness of the left lower extremity.  On presentation he was found to be hypertensive.  Chest x-ray showed prominent interstitial marking reflecting volume overload or atypical infection.  Patient was started on IV clindamycin for cellulitis of left lower extremity.  His kidney function deteriorated reaching creatinine of 4.8.  Antibiotics discontinued.  Patient was transferred to [facility] for possible nephrology consultation.  Renal function began to improve prior to transfer.His baseline creatinine is around 1.8.  Normal ultrasound of the kidneys.  He developed A. fib with RVR at [facility] which converted to sinus rhythm with amiodarone infusion and was transitioned to oral amiodarone.  He is on warfarin for anticoagulation.  He has history of COPD and uses 2 L of oxygen per minute at baseline.   He takes phenytoin for epilepsy.   He is currently not on any antibiotics for  Cellulitis which has resolved. He went into A. fib with RVR on 12/14/2019.  Cardiology consulted.  Started on amiodarone drip now back to PO amiodarone.  Assessment & Plan:   Principal Problem:   Acute renal failure superimposed on stage 3 chronic kidney disease (HCC) Active Problems:   Essential hypertension   History of DVT (deep vein thrombosis)   Absence epileptic syndrome, not intractable, without status epilepticus (HCC)   COPD (chronic obstructive pulmonary disease) (HCC)   Chronic respiratory failure with hypoxia (HCC)   Cellulitis of leg   Atrial fibrillation with RVR (HCC)   A. fib with RVR: Went into A. fib with RVR at [facility] and  here.  He was started on  amiodarone drip now on oral.  Cardiology closely following.  This morning heart rate is well controlled.  CHADS-VASc is 4 (CVA x2, DM, HTN), he was already on warfarin d/t hx of DVT .  AKI on CKD stage IIIa: Baseline creatinine around 1.8.  Developed AKI with creatinine peak at 4.8 at [facility] at which point antibiotics stopped and IV fluids were given.  Kidney function slowly improving.  Monitor BMP.  Avoid nephrotoxins.  Normal ultrasound of the kidneys.  Lasix on hold.  Acute on chronic diastolic CHF: Preserved ejection fraction as per echo from 12/04/2019.  Coreg was stopped at [facility] due to bradycardia.  He takes Lasix at home which is on hold.  Elevated BNP in the range of 200s.  COPD/chronic hypoxic respiratory failure: Uses 2 L of oxygen at baseline mainly during sleep.  Currently not in exacerbation.  Continue supplemental oxygen, as needed bronchodilators.  Hypertension: Hypertensive.  He is on hydralazine.We will add amlodipine 10 mg  Epilepsy: Continue phenytoin  Type 2 diabetes mellitus: Takes Metformin at home.  Continue sliding scale insulin  Lower extremity lymphedema, cellulitis: He was treated with antibiotics at that hospital.  Currently off antibiotics.  We have requested wound care.  Continue compression stockings/dressings on lower extremities.  He has mild leukocytosis  Debility/deconditioning: We have requested  physical therapy /OT evaluation.  Complains of severe generalized weakness and is unable to ambulate.         DVT prophylaxis:Coumadin Code Status: Full Family Communication: None.Discussed in detail with patient Status is: Inpatient  Remains inpatient appropriate because:Hemodynamically  unstable   Dispo: The patient is from: Home              Anticipated d/c is to: Home vs SNF              Anticipated d/c date is: 2 days              Patient currently is not medically stable to d/c.      Consultants:  Cardiology  Procedures:None  Antimicrobials:  Anti-infectives (From admission, onward)   None      Subjective: Patient seen and examined at the bedside this morning.  Hemodynamically stable.  Sitting in the chair.  Heart rate well controlled.  Complains of severe generalized weakness and very frustrated because he cannot move and get out of the chair.  Objective: Vitals:   12/14/19 2001 12/15/19 0030 12/15/19 0447 12/15/19 0759  BP: (!) 174/65 (!) 113/98 (!) 167/69 (!) 172/80  Pulse:  63 61 66  Resp: 17 20 20  (!) 21  Temp:  97.7 F (36.5 C) 98.2 F (36.8 C) 97.6 F (36.4 C)  TempSrc:  Oral Oral Oral  SpO2:  97% 99% 96%    Intake/Output Summary (Last 24 hours) at 12/15/2019 0815 Last data filed at 12/15/2019 0500 Gross per 24 hour  Intake 240 ml  Output 1100 ml  Net -860 ml   There were no vitals filed for this visit.  Examination:  General exam: Generalized weakness, comfortable, morbidly obese HEENT:PERRL,Oral mucosa moist, Ear/Nose normal on gross exam Respiratory system: Bilateral equal air entry, normal vesicular breath sounds, no wheezes or crackles  Cardiovascular system: Irregularly irregular rhythm, no JVD, murmurs, rubs, gallops or clicks. No pedal edema. Gastrointestinal system: Abdomen is nondistended, soft and nontender. No organomegaly or masses felt. Normal bowel sounds heard. Central nervous system: Alert and oriented. No focal neurological deficits. Extremities: No edema, no clubbing ,no cyanosis, legs wrapped with compression stockings, dressings Skin: No rashes, lesions or ulcers,no icterus ,no pallor   Data Reviewed: I have personally reviewed following labs and imaging studies  CBC: Recent Labs  Lab 12/14/19 0233 12/15/19 0359  WBC 14.9* 13.6*  NEUTROABS 12.2* 11.1*  HGB 9.3* 9.7*  HCT 30.1* 32.0*  MCV 93.5 96.1  PLT 277 656   Basic Metabolic Panel: Recent Labs  Lab 12/14/19 0233 12/15/19 0359  NA 135 136  K 4.3 4.6  CL 89* 91*   CO2 33* 30  GLUCOSE 195* 216*  BUN 52* 50*  CREATININE 2.45* 2.30*  CALCIUM 8.2* 8.6*  MG 1.1* 1.8   GFR: CrCl cannot be calculated (Unknown ideal weight.). Liver Function Tests: Recent Labs  Lab 12/14/19 0233  AST 14*  ALT 9  ALKPHOS 183*  BILITOT 0.5  PROT 7.0  ALBUMIN 2.2*   No results for input(s): LIPASE, AMYLASE in the last 168 hours. No results for input(s): AMMONIA in the last 168 hours. Coagulation Profile: Recent Labs  Lab 12/14/19 0233 12/15/19 0359  INR 1.9* 2.1*   Cardiac Enzymes: No results for input(s): CKTOTAL, CKMB, CKMBINDEX, TROPONINI in the last 168 hours. BNP (last 3 results) No results for input(s): PROBNP in the last 8760 hours. HbA1C: Recent Labs    12/14/19 0233  HGBA1C 8.3*   CBG: Recent Labs  Lab 12/14/19 0607 12/14/19 1108 12/14/19 2158 12/15/19 0612  GLUCAP 195* 195* 258* 194*   Lipid Profile: No results for input(s): CHOL, HDL, LDLCALC, TRIG, CHOLHDL, LDLDIRECT in the last 72 hours. Thyroid Function Tests: No results for input(s): TSH,  T4TOTAL, FREET4, T3FREE, THYROIDAB in the last 72 hours. Anemia Panel: No results for input(s): VITAMINB12, FOLATE, FERRITIN, TIBC, IRON, RETICCTPCT in the last 72 hours. Sepsis Labs: No results for input(s): PROCALCITON, LATICACIDVEN in the last 168 hours.  Recent Results (from the past 240 hour(s))  SARS Coronavirus 2 by RT PCR (hospital order, performed in [facility] hospital lab) Nasopharyngeal Nasopharyngeal Swab     Status: None   Collection Time: 12/14/19  2:34 AM   Specimen: Nasopharyngeal Swab  Result Value Ref Range Status   SARS Coronavirus 2 NEGATIVE NEGATIVE Final    Comment: (NOTE) SARS-CoV-2 target nucleic acids are NOT DETECTED.  The SARS-CoV-2 RNA is generally detectable in upper and lower respiratory specimens during the acute phase of infection. The lowest concentration of SARS-CoV-2 viral copies this assay can detect is 250 copies / mL. A negative result does not  preclude SARS-CoV-2 infection and should not be used as the sole basis for treatment or other patient management decisions.  A negative result may occur with improper specimen collection / handling, submission of specimen other than nasopharyngeal swab, presence of viral mutation(s) within the areas targeted by this assay, and inadequate number of viral copies (<250 copies / mL). A negative result must be combined with clinical observations, patient history, and epidemiological information.  Fact Sheet for Patients:   StrictlyIdeas.no  Fact Sheet for Healthcare Providers: BankingDealers.co.za  This test is not yet approved or  cleared by the [place] FDA and has been authorized for detection and/or diagnosis of SARS-CoV-2 by FDA under an Emergency Use Authorization (EUA).  This EUA will remain in effect (meaning this test can be used) for the duration of the COVID-19 declaration under Section 564(b)(1) of the Act, 21 U.S.C. section 360bbb-3(b)(1), unless the authorization is terminated or revoked sooner.  Performed at [facility], [place] [address], [place], [place] [address]          Radiology Studies: No results found.      Scheduled Meds: . amiodarone  200 mg Oral BID  . hydrALAZINE  50 mg Oral Q8H  . insulin aspart  0-5 Units Subcutaneous QHS  . insulin aspart  0-9 Units Subcutaneous TID WC  . phenytoin  300 mg Oral BID  . sodium chloride flush  3 mL Intravenous Q12H  . sodium chloride flush  3 mL Intravenous Q12H  . Warfarin - Pharmacist Dosing Inpatient   Does not apply q1600   Continuous Infusions: . sodium chloride       LOS: 1 day    Time spent: More than 50% of that time was spent in counseling and/or coordination of care.      [name], MD [facility] P7/18/2021, 8:15 AM

## 2019-12-15 NOTE — Progress Notes (Signed)
Orthopedic Tech Progress Note Patient Details:  Jamiere Gulas Aug 17, 1955 [id number]  Ortho Devices Type of Ortho Device: Haematologist Ortho Device/Splint Location: BLE Ortho Device/Splint Interventions: Ordered, Application   Post Interventions Patient Tolerated: Well   Ellouise Newer 12/15/2019, 6:46 PM

## 2019-12-15 NOTE — Progress Notes (Addendum)
Progress Note  Patient Name: Matthew Gutierrez Date of Encounter: 12/15/2019  Primary Cardiologist: No primary care provider on file.   Subjective   Denies any chest pain or pressure and no palpitations.  Converted back to sinus brady last night in the 40's and IV Amio stopped and placed back on PO Amio.  Has had some short episodes of PAF but nothing sustained.   Had chest burning last night that was relieved with NTG.   Inpatient Medications    Scheduled Meds: . amiodarone  200 mg Oral BID  . hydrALAZINE  50 mg Oral Q8H  . insulin aspart  0-5 Units Subcutaneous QHS  . insulin aspart  0-9 Units Subcutaneous TID WC  . phenytoin  300 mg Oral BID  . sodium chloride flush  3 mL Intravenous Q12H  . sodium chloride flush  3 mL Intravenous Q12H  . warfarin  4 mg Oral ONCE-1600  . Warfarin - Pharmacist Dosing Inpatient   Does not apply q1600   Continuous Infusions: . sodium chloride     PRN Meds: sodium chloride, acetaminophen **OR** acetaminophen, albuterol, hydrALAZINE, HYDROcodone-acetaminophen, ondansetron **OR** ondansetron (ZOFRAN) IV, senna-docusate, sodium chloride flush   Vital Signs    Vitals:   12/14/19 2001 12/15/19 0030 12/15/19 0447 12/15/19 0759  BP: (!) 174/65 (!) 113/98 (!) 167/69 (!) 172/80  Pulse:  63 61 66  Resp: 17 20 20  (!) 21  Temp:  97.7 F (36.5 C) 98.2 F (36.8 C) 97.6 F (36.4 C)  TempSrc:  Oral Oral Oral  SpO2:  97% 99% 96%    Intake/Output Summary (Last 24 hours) at 12/15/2019 0957 Last data filed at 12/15/2019 0900 Gross per 24 hour  Intake 120 ml  Output 1600 ml  Net -1480 ml   There were no vitals filed for this visit.  Telemetry    NSR - Personally Reviewed  ECG    No new EKG to review - Personally Reviewed  Physical Exam   GEN: No acute distress.   Neck: No JVD Cardiac: RRR, no murmurs, rubs, or gallops.  Respiratory: Clear to auscultation bilaterally. GI: Soft, nontender, non-distended  MS: legs wrapped Neuro:  Nonfocal   Psych: Normal affect   Labs    Chemistry Recent Labs  Lab 12/14/19 0233 12/15/19 0359  NA 135 136  K 4.3 4.6  CL 89* 91*  CO2 33* 30  GLUCOSE 195* 216*  BUN 52* 50*  CREATININE 2.45* 2.30*  CALCIUM 8.2* 8.6*  PROT 7.0  --   ALBUMIN 2.2*  --   AST 14*  --   ALT 9  --   ALKPHOS 183*  --   BILITOT 0.5  --   GFRNONAA 27* 29*  GFRAA 31* 34*  ANIONGAP 13 15     Hematology Recent Labs  Lab 12/14/19 0233 12/15/19 0359  WBC 14.9* 13.6*  RBC 3.22* 3.33*  HGB 9.3* 9.7*  HCT 30.1* 32.0*  MCV 93.5 96.1  MCH 28.9 29.1  MCHC 30.9 30.3  RDW 14.3 14.7  PLT 277 257    Cardiac EnzymesNo results for input(s): TROPONINI in the last 168 hours. No results for input(s): TROPIPOC in the last 168 hours.   BNP Recent Labs  Lab 12/15/19 0359  BNP 240.9*     DDimer No results for input(s): DDIMER in the last 168 hours.   Radiology    No results found.  Cardiac Studies   2D echo at [facility] with normal LVF and EF 55% and moderate LVH but  limited views and poor images and valves poorly visualized  Patient Profile     64 y.o. male with a hx of HTN, HLD, DM, COPD, [place], CVA, DVT who is being seen  for the evaluation of atrial fibrillation with RVR at the request of [name], MD.  [place]    1. Atrial fibrillation with RVR -he has been in and out of afib with RVR since admission to [facility] -likely driven but infection. -initially on IV Cardizem gtt then converted to NSR>had more afib and started on Carvedilol>recurrent bouts of afib resulted in addition of IV Amio gtt but then got brady in NSR and BB stopped and changed to oral Amio 200mg  BID -was back in afib with RVR on arrival from [place] yesterday and placed back on IV Amio gtt -now back in NSR and IV Amio stopped last night after converting to SB with HR in the 40's and now on PO Amio 200mg  BID -he may have tachybrady syndrome and may ultimately need PPM if we cannot maintain NSR and HR control on  Amio without bradyarrhythmias.  -will continue Amio 200mg  BID and avoid BB and CCB for now -continue Coumadin per pharmacy -QTc appears prolonged on tele>>check 12 lead EKG  2.  Elevated troponin -Trop mildly elevated at 0.06>0.11>0.13>0.11 - this likely does not represent ACS and likely demand ischemia in the setting of afib with RVR, AKI, CHF -he has had several episodes of CP since being in hospital >>one episode resolved with him rubbing his chest>>another episode yesterday while talking to his son and getting upset resolved with SL NTG. - 2D echo at [facility] was poor quality with low normal LVF - will reassess echo here now that he is in NSR and use definity - he is anticoagulated with comadin - check FLP -would recommend nuclear stress test to assess for ischemia prior to discharge>currently not candidate for cath now due to AKI  3.  Acute diastolic CHF -likely related to acute illness with infection, AKI and afib with RVR -diuretics now on hold due to worsening renal function -he does not appear volume overloaded -creatinine stable today (2.45>2.3).  4.  LLE cellulitis -antibx per [facility]  5.  AKI -worsening renal function -Creat peaked at 4.8 and trending down (2.45>2.3)with baseline around 1.8 -US kidneys normal -renal following  6..  COPD -on 2L O2 at home -per [facility]  7.  HTN -BP elevated on exam -continue Hydralazine 50mg  q8 hours and titrate per [facility]   For questions or updates, please contact [place] Please consult [email or web address] for contact info under Cardiology/STEMI.      Signed, Fransico Him, MD  12/15/2019, 9:57 AM

## 2019-12-15 NOTE — Evaluation (Signed)
Occupational Therapy Evaluation Patient Details Name: Matthew Gutierrez MRN: [id number] DOB: [birth year] Today's Date: 12/15/2019    History of Present Illness  64 y.o. male with medical history significant for DVT on warfarin, obesity, lymphedema, peripheral vascular disease, epilepsy, chronic kidney disease stage III, and hypertension who presented to [facility] emergency department on 12/04/2019 for evaluation of increased swelling and redness involving the left lower extremity. Patient admitted [facility] 7/17 Acute kidney injury superimposed on CKD IIIa, Paroxysmal atrial fibrillation, COPD; chronic hypoxic respiratory failure.    Clinical Impression   Patient with functional deficits listed below impacting safety and independence with self care. Patient require mod A to power up to standing from recliner, min/mod A to steady before taking few steps forward/back with rolling walker. Patient desat to 86-87% on 2L, cue for PLB techniques and with seated recover back to 93-94% in ~30 seconds. Patient reports he was up walking just 2 days ago and is having trouble understanding how "I went downhill so fast." Recommend rehab prior to D/C home due to increased level of assistance for functional transfers, ADLs and patient living home alone. Will continue to follow.    Follow Up Recommendations  SNF    Equipment Recommendations  None recommended by OT (has home DME)       Precautions / Restrictions Precautions Precautions: Fall Precaution Comments: monitor O2 Restrictions Weight Bearing Restrictions: No      Mobility Bed Mobility               General bed mobility comments: in recliner upon arrival  Transfers Overall transfer level: Needs assistance Equipment used: Rolling walker (2 wheeled) Transfers: Sit to/from Stand Sit to Stand: Mod assist         General transfer comment: mod A to power up to standing with min/mod A for steadying assistance before taking few steps  forward/back with rolling walker. desat to 86-87% on 2L. cue pursed lip breathing returns to 93-94% within ~30 seconds seated recovery    Balance Overall balance assessment: Needs assistance         Standing balance support: Bilateral upper extremity supported;During functional activity Standing balance-Leahy Scale: Poor Standing balance comment: relies on walker                           ADL either performed or assessed with clinical judgement   ADL Overall ADL's : Needs assistance/impaired     Grooming: Set up;Sitting   Upper Body Bathing: Set up;Sitting   Lower Body Bathing: Maximal assistance;Sit to/from stand;Sitting/lateral leans   Upper Body Dressing : Set up;Sitting   Lower Body Dressing: Maximal assistance;Sit to/from stand;Sitting/lateral leans Lower Body Dressing Details (indicate cue type and reason): patient reports HH RN dons clean socks every Mon and Fri when she changes bandages, has reacher he uses to assist with underwear/pants Toilet Transfer: Moderate assistance;Cueing for safety;Cueing for sequencing;BSC;RW;Ambulation Toilet Transfer Details (indicate cue type and reason): few steps forward/back from recliner, mod A to power up to standing Toileting- Clothing Manipulation and Hygiene: Maximal assistance;Sitting/lateral lean;Sit to/from stand       Functional mobility during ADLs: Moderate assistance;Rolling walker;Cueing for sequencing;Cueing for safety General ADL Comments: patient requiring increased assistance with self care tasks due to decreased strength, activity tolerance, pain in LEs, decrease O2 with activity                  Pertinent Vitals/Pain Pain Assessment: Faces Faces Pain Scale: Hurts little more Pain  Location: B LEs Pain Descriptors / Indicators: Sore Pain Intervention(s): Monitored during session     Hand Dominance Right   Extremity/Trunk Assessment Upper Extremity Assessment Upper Extremity Assessment:  Generalized weakness   Lower Extremity Assessment Lower Extremity Assessment: Defer to PT evaluation       Communication Communication Communication: HOH   Cognition Arousal/Alertness: Awake/alert Behavior During Therapy: WFL for tasks assessed/performed Overall Cognitive Status: Within Functional Limits for tasks assessed                                     General Comments  desat in O2 to 86-87% on 2L with minimal activity, cues for PLB and recover with seated rest in ~30 seconds to 93-94%            Home Living Family/patient expects to be discharged to:: Private residence Living Arrangements: Alone Available Help at Discharge: Family;Available PRN/intermittently (family lives far away) Type of Home: Apartment Home Access: Level entry     Home Layout: One level     Bathroom Shower/Tub: Teacher, early years/pre: Handicapped height     Home Equipment: Environmental consultant - 2 wheels;Bedside commode;Cane - single point;Shower seat - built in;Grab bars - tub/shower;Other (comment);Adaptive equipment (wooden ramp built to get into tub/shower by [employer] health?) Adaptive Equipment: Reacher Additional Comments: leaves a walker in home and one in car      Prior Functioning/Environment Level of Independence: Independent with assistive device(s)        Comments: patient reports wearing continuous O2 for past ~6 months, I with self care except [facility] RN assist with doff/don socks 2x/week, drives gets own groceries        OT Problem List: Decreased strength;Decreased activity tolerance;Impaired balance (sitting and/or standing);Decreased safety awareness;Pain;Obesity      OT Treatment/Interventions: Self-care/ADL training;Therapeutic exercise;DME and/or AE instruction;Energy conservation;Therapeutic activities;Patient/family education;Balance training    OT Goals(Current goals can be found in the care plan section) Acute Rehab OT Goals Patient Stated Goal: "I  was walking just 2 days ago" OT Goal Formulation: With patient Time For Goal Achievement: 12/29/19 Potential to Achieve Goals: Good  OT Frequency: Min 2X/week    AM-PAC OT "6 Clicks" Daily Activity     Outcome Measure Help from another person eating meals?: None Help from another person taking care of personal grooming?: A Little Help from another person toileting, which includes using toliet, bedpan, or urinal?: A Lot Help from another person bathing (including washing, rinsing, drying)?: A Lot Help from another person to put on and taking off regular upper body clothing?: A Little Help from another person to put on and taking off regular lower body clothing?: A Lot 6 Click Score: 16   End of Session Equipment Utilized During Treatment: Rolling walker;Oxygen Nurse Communication: Mobility status;Other (comment) (O2 with activity)  Activity Tolerance: Patient limited by fatigue Patient left: in chair;with call bell/phone within reach  OT Visit Diagnosis: Other abnormalities of gait and mobility (R26.89);Muscle weakness (generalized) (M62.81);Pain Pain - Right/Left:  (bilateral) Pain - part of body: Leg                Time: 8937-3428 OT Time Calculation (min): 19 min Charges:  OT General Charges $OT Visit: 1 Visit OT Evaluation $OT Eval Moderate Complexity: [place] OT OT office: [place] 12/15/2019, 1:22 PM

## 2019-12-15 NOTE — Progress Notes (Signed)
°  Echocardiogram 2D Echocardiogram has been performed.  Michiel Cowboy 12/15/2019, 3:28 PM

## 2019-12-15 NOTE — Consult Note (Signed)
[place] Nurse Consult Note: Reason for Consult: Patient wears bilateral Unna's Boots in the community. They are changed twice weekly on Mondays and Friday.  We will place today and change on Sundays and Wednesdays while in house. There are two very small full thickness wounds on the bilateral medial malleoli. There is a brown eschar on the second digit on the left foot for which I will provide twice daily wound care orders. Wound type: Venous insufficiency Pressure Injury POA: N/A  Measurement: Left LE:  2.8cm x 0.8cm x 0.3cm with pink wound bed, no exudate. Left foot, second digit:  3cm x 0.6cm dry brown eschar Right LE: 1.5cm x 0.4cm x 0.2cm with pink wound bed, no exudate  Wound bed: As noted above Drainage (amount, consistency, odor) As noted above  Periwound: edematous, scaly Dressing procedure/placement/frequency: Patient will have LEs washed today with soap and water, rinsed and thoroughly dried.  The Bedside RN will obtain 4 coban bandages and will page Ortho Tech according to protocol. Ortho Tech to place [employer] and maintain them with twice weekly changes until discharged. No dressing or topical care other than the Unna's Boots is indicated for the wounds.  Patint will resume twice weekly application of Unna's Boots by the [facility] upon discharge at the schedule previously arranged.  The brown eschar on the left foot second digit will be painted twice daily with a betadine swabstick and allowed to air dry. This is expected to dry the eschar and form a scab that will eventually lift atraumatically.  [facility] nursing team will not follow, but will remain available to this patient, the nursing and medical teams.  Please re-consult if needed. Thanks, Maudie Flakes, MSN, RN, [place], Arther Abbott  Pager# [phone]

## 2019-12-15 NOTE — Progress Notes (Signed)
ANTICOAGULATION - Follow Up Consult  Pharmacy Consult for Warfarin Indication: h/o DVT and new afib  Allergies  Allergen Reactions  . Codeine    Vital Signs: Temp: 97.6 F (36.4 C) (07/18 0759) Temp Source: Oral (07/18 0759) BP: 172/80 (07/18 0759) Pulse Rate: 66 (07/18 0759)  Labs: Recent Labs    12/14/19 0233 12/15/19 0359  HGB 9.3* 9.7*  HCT 30.1* 32.0*  PLT 277 257  LABPROT 21.1* 22.4*  INR 1.9* 2.1*  CREATININE 2.45* 2.30*    CrCl cannot be calculated (Unknown ideal weight.).   Medical History: Past Medical History:  Diagnosis Date  . Epilepsy, grand mal ([place])   . Frozen shoulder    Right Shoulder  . Lumbago   . Peripheral vascular disease ([place])   . Stroke [facility])     Assessment: 64 y.o. M presented from [facility] on 7/17 with history of DVT and new onset atrial fibrillation. Patient on Coumadin PTA with home dose of 4 mg daily. Admission INR at [facility] 2.6. Patient was started on amiodarone during this admission which may potentiate the effects of warfarin. Pharmacy consulted for Coumadin dosing.  Patient is therapeutic on home regimen.  Goal of Therapy:  INR 2-3 Monitor platelets by anticoagulation protocol: Yes   Plan:  Daily INR Coumadin 4mg  po tonight  Romilda Garret, PharmD PGY1 Acute Care Pharmacy Resident Phone: [phone] 12/15/2019 9:00 AM  Please check [email or web address] for unit specific pharmacy phone numbers.

## 2019-12-16 DIAGNOSIS — I10 Essential (primary) hypertension: Secondary | ICD-10-CM | POA: Diagnosis not present

## 2019-12-16 DIAGNOSIS — I4891 Unspecified atrial fibrillation: Secondary | ICD-10-CM | POA: Diagnosis not present

## 2019-12-16 DIAGNOSIS — N179 Acute kidney failure, unspecified: Secondary | ICD-10-CM | POA: Diagnosis not present

## 2019-12-16 DIAGNOSIS — N1831 Chronic kidney disease, stage 3a: Secondary | ICD-10-CM | POA: Diagnosis not present

## 2019-12-16 DIAGNOSIS — R778 Other specified abnormalities of plasma proteins: Secondary | ICD-10-CM | POA: Diagnosis not present

## 2019-12-16 LAB — BASIC METABOLIC PANEL
Anion gap: 13 (ref 5–15)
BUN: 47 mg/dL — ABNORMAL HIGH (ref 8–23)
CO2: 31 mmol/L (ref 22–32)
Calcium: 8.9 mg/dL (ref 8.9–10.3)
Chloride: 90 mmol/L — ABNORMAL LOW (ref 98–111)
Creatinine, Ser: 2.37 mg/dL — ABNORMAL HIGH (ref 0.61–1.24)
GFR calc Af Amer: 32 mL/min — ABNORMAL LOW (ref >60)
GFR calc non Af Amer: 28 mL/min — ABNORMAL LOW (ref >60)
Glucose, Bld: 226 mg/dL — ABNORMAL HIGH (ref 70–99)
Potassium: 4.7 mmol/L (ref 3.5–5.1)
Sodium: 134 mmol/L — ABNORMAL LOW (ref 135–145)

## 2019-12-16 LAB — CBC WITH DIFFERENTIAL/PLATELET
Abs Immature Granulocytes: 0.07 10*3/uL (ref 0.00–0.07)
Basophils Absolute: 0.1 10*3/uL (ref 0.0–0.1)
Basophils Relative: 1 %
Eosinophils Absolute: 0.2 10*3/uL (ref 0.0–0.5)
Eosinophils Relative: 1 %
HCT: 31.7 % — ABNORMAL LOW (ref 39.0–52.0)
Hemoglobin: 9.6 g/dL — ABNORMAL LOW (ref 13.0–17.0)
Immature Granulocytes: 1 %
Lymphocytes Relative: 5 %
Lymphs Abs: 0.8 10*3/uL (ref 0.7–4.0)
MCH: 29.1 pg (ref 26.0–34.0)
MCHC: 30.3 g/dL (ref 30.0–36.0)
MCV: 96.1 fL (ref 80.0–100.0)
Monocytes Absolute: 1.2 10*3/uL — ABNORMAL HIGH (ref 0.1–1.0)
Monocytes Relative: 8 %
Neutro Abs: 12.5 10*3/uL — ABNORMAL HIGH (ref 1.7–7.7)
Neutrophils Relative %: 84 %
Platelets: 304 10*3/uL (ref 150–400)
RBC: 3.3 MIL/uL — ABNORMAL LOW (ref 4.22–5.81)
RDW: 14.6 % (ref 11.5–15.5)
WBC: 14.9 10*3/uL — ABNORMAL HIGH (ref 4.0–10.5)
nRBC: 0 % (ref 0.0–0.2)

## 2019-12-16 LAB — GLUCOSE, CAPILLARY
Glucose-Capillary: 307 mg/dL — ABNORMAL HIGH (ref 70–99)
Glucose-Capillary: 226 mg/dL — ABNORMAL HIGH (ref 70–99)
Glucose-Capillary: 283 mg/dL — ABNORMAL HIGH (ref 70–99)
Glucose-Capillary: 226 mg/dL — ABNORMAL HIGH (ref 70–99)
Glucose-Capillary: 248 mg/dL — ABNORMAL HIGH (ref 70–99)
Glucose-Capillary: 165 mg/dL — ABNORMAL HIGH (ref 70–99)

## 2019-12-16 LAB — PROTIME-INR
INR: 2.9 — ABNORMAL HIGH (ref 0.8–1.2)
Prothrombin Time: 29.7 seconds — ABNORMAL HIGH (ref 11.4–15.2)

## 2019-12-16 MED ORDER — AMIODARONE HCL IN DEXTROSE 360-4.14 MG/200ML-% IV SOLN
30.0000 mg/h | INTRAVENOUS | Status: DC
  Administered 2019-12-16: 30 mg/h via INTRAVENOUS
  Filled 2019-12-16: qty 200

## 2019-12-16 MED ORDER — INSULIN GLARGINE 100 UNIT/ML ~~LOC~~ SOLN
8.0000 [IU] | Freq: Every day | SUBCUTANEOUS | Status: DC
  Administered 2019-12-16 – 2019-12-17 (×2): 8 [IU] via SUBCUTANEOUS
  Filled 2019-12-16 (×2): qty 0.08

## 2019-12-16 MED ORDER — PRAVASTATIN SODIUM 40 MG PO TABS
40.0000 mg | ORAL_TABLET | Freq: Every day | ORAL | Status: DC
  Administered 2019-12-16 – 2019-12-26 (×11): 40 mg via ORAL
  Filled 2019-12-16 (×11): qty 1

## 2019-12-16 MED ORDER — AMIODARONE HCL 200 MG PO TABS
200.0000 mg | ORAL_TABLET | Freq: Two times a day (BID) | ORAL | Status: AC
  Administered 2019-12-22 – 2019-12-26 (×10): 200 mg via ORAL
  Filled 2019-12-16 (×10): qty 1

## 2019-12-16 MED ORDER — HYDRALAZINE HCL 50 MG PO TABS
75.0000 mg | ORAL_TABLET | Freq: Three times a day (TID) | ORAL | Status: DC
  Administered 2019-12-16 – 2019-12-18 (×6): 75 mg via ORAL
  Filled 2019-12-16 (×7): qty 1

## 2019-12-16 MED ORDER — AMIODARONE HCL 200 MG PO TABS
200.0000 mg | ORAL_TABLET | Freq: Every day | ORAL | Status: DC
  Administered 2019-12-27: 200 mg via ORAL
  Filled 2019-12-16: qty 1

## 2019-12-16 MED ORDER — CARVEDILOL 3.125 MG PO TABS
3.1250 mg | ORAL_TABLET | Freq: Two times a day (BID) | ORAL | Status: DC
  Administered 2019-12-16 – 2019-12-17 (×2): 3.125 mg via ORAL
  Filled 2019-12-16 (×2): qty 1

## 2019-12-16 MED ORDER — WARFARIN SODIUM 1 MG PO TABS
1.0000 mg | ORAL_TABLET | Freq: Once | ORAL | Status: AC
  Administered 2019-12-16: 1 mg via ORAL
  Filled 2019-12-16: qty 1

## 2019-12-16 MED ORDER — AMIODARONE HCL IN DEXTROSE 360-4.14 MG/200ML-% IV SOLN
60.0000 mg/h | INTRAVENOUS | Status: DC
  Administered 2019-12-16: 60 mg/h via INTRAVENOUS
  Filled 2019-12-16 (×2): qty 200

## 2019-12-16 MED ORDER — FUROSEMIDE 10 MG/ML IJ SOLN
40.0000 mg | Freq: Two times a day (BID) | INTRAMUSCULAR | Status: DC
  Administered 2019-12-16 – 2019-12-17 (×2): 40 mg via INTRAVENOUS
  Filled 2019-12-16 (×2): qty 4

## 2019-12-16 MED ORDER — AMIODARONE HCL 200 MG PO TABS
400.0000 mg | ORAL_TABLET | Freq: Two times a day (BID) | ORAL | Status: AC
  Administered 2019-12-17 – 2019-12-21 (×10): 400 mg via ORAL
  Filled 2019-12-16 (×10): qty 2

## 2019-12-16 NOTE — Progress Notes (Addendum)
ANTICOAGULATION - Follow Up Consult  Pharmacy Consult for Coumadin Indication: h/o DVT and new afib  Allergies  Allergen Reactions  . Codeine    Vital Signs: Temp: 97.5 F (36.4 C) (07/19 0830) Temp Source: Oral (07/19 0830) BP: 188/82 (07/19 0830) Pulse Rate: 72 (07/19 0830)  Labs: Recent Labs    12/14/19 0233 12/14/19 0233 12/15/19 0359 12/16/19 0427  HGB 9.3*   < > 9.7* 9.6*  HCT 30.1*  --  32.0* 31.7*  PLT 277  --  257 304  LABPROT 21.1*  --  22.4* 29.7*  INR 1.9*  --  2.1* 2.9*  CREATININE 2.45*  --  2.30* 2.37*   < > = values in this interval not displayed.    Estimated Creatinine Clearance: 46.1 mL/min (A) (by C-G formula based on SCr of 2.37 mg/dL (H)).   Medical History: Past Medical History:  Diagnosis Date  . Epilepsy, grand mal ([place])   . Frozen shoulder    Right Shoulder  . Lumbago   . Peripheral vascular disease ([place])   . Stroke [facility])     Assessment: 64 y.o. M presented from [facility] on 7/17 with history of DVT and new onset atrial fibrillation. Patient on Coumadin PTA with home dose of 4 mg daily. Admission INR at [facility] 2.6. Patient was started on amiodarone during this admission which may potentiate the effects of warfarin. Pharmacy consulted for Coumadin dosing.  Patient had a large increase in INR value from 2.1 to 2.9 in last 24 hours after a dose of 4mg .  Amiodarone IV has been initiated again. Patient remains on Phenytoin both of which can interact with Warfarin. Will decrease dose significantly today due to large increase and follow-up daily INR.   Goal of Therapy:  INR 2-3 Monitor platelets by anticoagulation protocol: Yes   Plan:  Daily INR Coumadin 1mg  po tonight  Sloan Leiter, PharmD, BCPS, BCCCP Clinical Pharmacist Please refer to [facility] for [place] numbers 12/16/2019 8:46 AM  Please check [email or web address] for unit specific pharmacy phone numbers.

## 2019-12-16 NOTE — Progress Notes (Addendum)
PROGRESS NOTE    Matthew Gutierrez  KDX:[id number] DOB: December 15, 1955 DOA: 12/14/2019 PCP: Lillard Anes, MD   Brief Narrative: Patient is a 64 year old male with history of DVT on Coumadin, obesity, lymphedema, peripheral vascular disease, epilepsy, CKD stage IIIb, hypertension who presented to the [facility] for the evaluation  of increased swelling and redness of the left lower extremity.  On presentation he was found to be hypertensive.  Chest x-ray showed prominent interstitial marking reflecting volume overload or atypical infection.  Patient was started on IV clindamycin for cellulitis of left lower extremity.  His kidney function deteriorated reaching creatinine of 4.8.  Antibiotics discontinued.  Patient was transferred to [facility] for possible nephrology consultation.  Renal function began to improve prior to transfer.His baseline creatinine is around 1.8.  Normal ultrasound of the kidneys.  He developed A. fib with RVR at [facility] which converted to sinus rhythm with amiodarone infusion and was transitioned to oral amiodarone.  He is on warfarin for anticoagulation.  He has history of COPD and uses 2 L of oxygen per minute at baseline.   He takes phenytoin for epilepsy.   He is currently not on any antibiotics for  Cellulitis which has resolved. He went into A. fib with RVR on 12/14/2019 and again last night.  Cardiology consulted.  Started on amiodarone drip .  Currently maintaining sinus rhythm.  PT/OT recommended skilled nursing  facility on discharge.  Assessment & Plan:   Principal Problem:   Acute renal failure superimposed on stage 3 chronic kidney disease (HCC) Active Problems:   Essential hypertension   History of DVT (deep vein thrombosis)   Absence epileptic syndrome, not intractable, without status epilepticus (HCC)   COPD (chronic obstructive pulmonary disease) (HCC)   Chronic respiratory failure with hypoxia (HCC)   Cellulitis of leg   Atrial  fibrillation with RVR (HCC)   A. fib with RVR: Went into A. fib with RVR at [facility] and here.  He was started on  amiodarone drip,switched to  Oral now back to drip.  Cardiology closely following.  This morning heart rate is well controlled.  CHADS-VASc is 4 (CVA x2, DM, HTN), he was already on warfarin d/t hx of DVT . He may need a pacemaker.  AKI on CKD stage IIIa: Baseline creatinine around 1.8.  Developed AKI with creatinine peak at 4.8 at [place] at which point antibiotics stopped and IV fluids were given.  Kidney function slowly improving but hanging around 2.3.  Monitor BMP.  Avoid nephrotoxins.  Normal ultrasound of the kidneys.  Currently looks volume overloaded, cardiology started on Lasix 40 mg twice daily.  Acute on chronic diastolic CHF: Preserved ejection fraction as per echo from 12/04/2019.  Coreg was stopped at [facility] due to bradycardia but now has been resumed.  He takes Lasix at home .  Elevated BNP in the range of 200s.  COPD/chronic hypoxic respiratory failure: Uses 2 L of oxygen at baseline mainly during sleep.  Currently not in exacerbation.  Continue supplemental oxygen, as needed bronchodilators.  Hypertension: Hypertensive.  He is on hydralazine with increased sode.We have added amlodipine 10 mg.resumed carvedilol  Epilepsy: Continue phenytoin  Type 2 diabetes mellitus: Takes Metformin at home.  Continue sliding scale insulin and lantus.HbA1c of 8.3  Lower extremity lymphedema, venous insufficiency/cellulitis: He was treated with antibiotics at that hospital.  Currently off antibiotics.  Followed by  wound care and recommended Unna boots.  Continue compression stockings/dressings on lower extremities.  He has  mild leukocytosis  Debility/deconditioning: Complains of severe generalized weakness and is unable to ambulate.  PT/OT recommended skilled nursing facility on discharge.         DVT prophylaxis:Coumadin Code Status: Full Family  Communication: None.Discussed in detail with patient Status is: Inpatient  Remains inpatient appropriate because:Hemodynamically unstable   Dispo: The patient is from: Home              Anticipated d/c is to: Home vs SNF              Anticipated d/c date is: 2 days              Patient currently is not medically stable to d/c.      Consultants: Cardiology  Procedures:None  Antimicrobials:  Anti-infectives (From admission, onward)   None      Subjective: Patient seen and examined at the bedside this morning.  Sitting in the chair.  Today he feels better.  Last night he again went to A. fib with RVR and was started on amiodarone drip.  Denies any shortness of breath or chest pain during my evaluation.  Currently in normal sinus rhythm.  Objective: Vitals:   12/16/19 0300 12/16/19 0400 12/16/19 0520 12/16/19 0600  BP: (!) 157/120 (!) 126/108 (!) 163/86 137/61  Pulse: (!) 144     Resp: 20 20 19 20   Temp: (!) 97.4 F (36.3 C)     TempSrc: Oral     SpO2: 96%  95%   Weight: (!) 142.3 kg     Height:        Intake/Output Summary (Last 24 hours) at 12/16/2019 0743 Last data filed at 12/16/2019 0600 Gross per 24 hour  Intake 691.6 ml  Output 1700 ml  Net -1008.4 ml   Filed Weights   12/15/19 1527 12/16/19 0300  Weight: (!) 142.4 kg (!) 142.3 kg    Examination:   General exam: Sitting comfortably in the chair, obese HEENT:PERRL,Oral mucosa moist, Ear/Nose normal on gross exam Respiratory system: Bilateral equal air entry, no obvious wheezes or crackles  Cardiovascular system: S1 & S2 heard, RRR. No JVD, murmurs, rubs, gallops or clicks. Gastrointestinal system: Abdomen is nondistended, soft and nontender. No organomegaly or masses felt. Normal bowel sounds heard. Central nervous system: Alert and oriented.  Extremities: trace bilateral lower extremity  edema, no clubbing ,no cyanosis Skin: No rashes, lesions or ulcers,no icterus ,no pallor    Data Reviewed: I  have personally reviewed following labs and imaging studies  CBC: Recent Labs  Lab 12/14/19 0233 12/15/19 0359 12/16/19 0427  WBC 14.9* 13.6* 14.9*  NEUTROABS 12.2* 11.1* 12.5*  HGB 9.3* 9.7* 9.6*  HCT 30.1* 32.0* 31.7*  MCV 93.5 96.1 96.1  PLT 277 257 086   Basic Metabolic Panel: Recent Labs  Lab 12/14/19 0233 12/15/19 0359 12/16/19 0427  NA 135 136 134*  K 4.3 4.6 4.7  CL 89* 91* 90*  CO2 33* 30 31  GLUCOSE 195* 216* 226*  BUN 52* 50* 47*  CREATININE 2.45* 2.30* 2.37*  CALCIUM 8.2* 8.6* 8.9  MG 1.1* 1.8  --    GFR: Estimated Creatinine Clearance: 46.1 mL/min (A) (by C-G formula based on SCr of 2.37 mg/dL (H)). Liver Function Tests: Recent Labs  Lab 12/14/19 0233  AST 14*  ALT 9  ALKPHOS 183*  BILITOT 0.5  PROT 7.0  ALBUMIN 2.2*   No results for input(s): LIPASE, AMYLASE in the last 168 hours. No results for input(s): AMMONIA in the last 168  hours. Coagulation Profile: Recent Labs  Lab 12/14/19 0233 12/15/19 0359 12/16/19 0427  INR 1.9* 2.1* 2.9*   Cardiac Enzymes: No results for input(s): CKTOTAL, CKMB, CKMBINDEX, TROPONINI in the last 168 hours. BNP (last 3 results) No results for input(s): PROBNP in the last 8760 hours. HbA1C: Recent Labs    12/14/19 0233  HGBA1C 8.3*   CBG: Recent Labs  Lab 12/15/19 0612 12/15/19 1107 12/15/19 1645 12/15/19 2047 12/16/19 0602  GLUCAP 194* 225* 211* 188* 226*   Lipid Profile: No results for input(s): CHOL, HDL, LDLCALC, TRIG, CHOLHDL, LDLDIRECT in the last 72 hours. Thyroid Function Tests: No results for input(s): TSH, T4TOTAL, FREET4, T3FREE, THYROIDAB in the last 72 hours. Anemia Panel: No results for input(s): VITAMINB12, FOLATE, FERRITIN, TIBC, IRON, RETICCTPCT in the last 72 hours. Sepsis Labs: No results for input(s): PROCALCITON, LATICACIDVEN in the last 168 hours.  Recent Results (from the past 240 hour(s))  SARS Coronavirus 2 by RT PCR (hospital order, performed in [facility] hospital  lab) Nasopharyngeal Nasopharyngeal Swab     Status: None   Collection Time: 12/14/19  2:34 AM   Specimen: Nasopharyngeal Swab  Result Value Ref Range Status   SARS Coronavirus 2 NEGATIVE NEGATIVE Final    Comment: (NOTE) SARS-CoV-2 target nucleic acids are NOT DETECTED.  The SARS-CoV-2 RNA is generally detectable in upper and lower respiratory specimens during the acute phase of infection. The lowest concentration of SARS-CoV-2 viral copies this assay can detect is 250 copies / mL. A negative result does not preclude SARS-CoV-2 infection and should not be used as the sole basis for treatment or other patient management decisions.  A negative result may occur with improper specimen collection / handling, submission of specimen other than nasopharyngeal swab, presence of viral mutation(s) within the areas targeted by this assay, and inadequate number of viral copies (<250 copies / mL). A negative result must be combined with clinical observations, patient history, and epidemiological information.  Fact Sheet for Patients:   StrictlyIdeas.no  Fact Sheet for Healthcare Providers: BankingDealers.co.za  This test is not yet approved or  cleared by the [place] FDA and has been authorized for detection and/or diagnosis of SARS-CoV-2 by FDA under an Emergency Use Authorization (EUA).  This EUA will remain in effect (meaning this test can be used) for the duration of the COVID-19 declaration under Section 564(b)(1) of the Act, 21 U.S.C. section 360bbb-3(b)(1), unless the authorization is terminated or revoked sooner.  Performed at [facility], [place] [address], [place], [place] [address]          Radiology Studies: ECHOCARDIOGRAM COMPLETE  Result Date: 12/15/2019    ECHOCARDIOGRAM REPORT   Patient Name:   IAN CASTAGNA Date of Exam: 12/15/2019 Medical Rec #:  [id number]      Height:       72.0 in Accession #:    [id number]      Weight:       322.0 lb Date of Birth:  1955/12/09      BSA:          2.609 m Patient Age:    48 years       BP:           171/62 mmHg Patient Gender: M              HR:           65 bpm. Exam Location:  Inpatient Procedure: 2D Echo, Cardiac Doppler, Color Doppler and Intracardiac  Opacification Agent Indications:    Atrial Fibrillation 427.31 / I48.91  History:        Patient has no prior history of Echocardiogram examinations.                 Previous Myocardial Infarction, COPD and Stroke,                 Arrythmias:Atrial Fibrillation; Risk Factors:Sleep Apnea and                 Former Smoker. PVD. DVT.  Sonographer:    Vickie Epley RDCS Referring Phys: [phone] TRACI R TURNER  Sonographer Comments: Patient is morbidly obese. Image acquisition challenging due to patient body habitus. IMPRESSIONS  1. Poor acoustic windows limit study Left ventricular ejection fraction, by estimation, is 60 to 65%. The left ventricle has normal function. Left ventricular endocardial border not optimally defined to evaluate regional wall motion. The left ventricular internal cavity size was mildly dilated. Left ventricular diastolic function could not be evaluated.  2. Right ventricular systolic function is grossly normal. The right ventricular size is normal.  3. Pericardial effusion is mainly posterior to LV 14 mm maximal dimenstion. No evid of filling compromise. . Moderate pericardial effusion.  4. The mitral valve is normal in structure. No evidence of mitral valve regurgitation.  5. The aortic valve is tricuspid. Aortic valve regurgitation is not visualized. Mild aortic valve sclerosis is present, with no evidence of aortic valve stenosis.  6. The inferior vena cava is dilated in size with >50% respiratory variability, suggesting right atrial pressure of 8 mmHg. FINDINGS  Left Ventricle: Poor acoustic windows limit study Cannot evaluate regional wall motion. Left ventricular ejection fraction, by estimation, is 60 to  65%. The left ventricle has normal function. Left ventricular endocardial border not optimally defined to  evaluate regional wall motion. The left ventricular internal cavity size was mildly dilated. There is no left ventricular hypertrophy. Left ventricular diastolic function could not be evaluated. Right Ventricle: The right ventricular size is normal. Right vetricular wall thickness was not assessed. Right ventricular systolic function is normal. Left Atrium: Left atrial size was normal in size. Right Atrium: Right atrial size was not well visualized. Pericardium: Pericardial effusion is mainly posterior to LV 14 mm maximal dimenstion. No evid of filling compromise. A moderately sized pericardial effusion is present. Mitral Valve: The mitral valve is normal in structure. No evidence of mitral valve regurgitation. Tricuspid Valve: The tricuspid valve is normal in structure. Tricuspid valve regurgitation is trivial. Aortic Valve: The aortic valve is tricuspid. Aortic valve regurgitation is not visualized. Mild aortic valve sclerosis is present, with no evidence of aortic valve stenosis. Pulmonic Valve: The pulmonic valve was grossly normal. Pulmonic valve regurgitation is not visualized. Aorta: The aortic root is normal in size and structure. Venous: The inferior vena cava is dilated in size with greater than 50% respiratory variability, suggesting right atrial pressure of 8 mmHg. IAS/Shunts: The interatrial septum was not assessed.  LEFT VENTRICLE PLAX 2D LVIDd:         5.20 cm LVIDs:         3.20 cm LV PW:         1.00 cm LV IVS:        1.00 cm LVOT diam:     2.40 cm LVOT Area:     4.52 cm  LEFT ATRIUM           Index LA diam:      4.40 cm  1.69 cm/m LA Vol (A4C): 21.7 ml 8.32 ml/m   AORTA Ao Root diam: 3.70 cm Ao Asc diam:  3.70 cm MITRAL VALVE MV Area (PHT): 3.45 cm    SHUNTS MV Decel Time: 220 msec    Systemic Diam: 2.40 cm MV E velocity: 43.50 cm/s MV A velocity: 43.90 cm/s MV E/A ratio:  0.99 Dorris Carnes  MD Electronically signed by Dorris Carnes MD Signature Date/Time: 12/15/2019/4:15:31 PM    Final         Scheduled Meds: . amLODipine  10 mg Oral Daily  . hydrALAZINE  50 mg Oral Q8H  . insulin aspart  0-5 Units Subcutaneous QHS  . insulin aspart  0-9 Units Subcutaneous TID WC  . phenytoin  300 mg Oral BID  . sodium chloride flush  3 mL Intravenous Q12H  . sodium chloride flush  3 mL Intravenous Q12H  . Warfarin - Pharmacist Dosing Inpatient   Does not apply q1600   Continuous Infusions: . sodium chloride    . amiodarone 60 mg/hr (12/16/19 0304)   Followed by  . amiodarone       LOS: 2 days    Time spent:25 mins,. More than 50% of that time was spent in counseling and/or coordination of care.      [name], MD [facility] P7/19/2021, 7:43 AM

## 2019-12-16 NOTE — NC FL2 (Signed)
Center MEDICAID FL2 LEVEL OF CARE SCREENING TOOL     IDENTIFICATION  Patient Name: Matthew Gutierrez Birthdate: 05/04/1956 Sex: male Admission Date (Current Location): 12/14/2019  [facility] and [place] Number:  Herbalist and Address:  [facility], [place] [address] [place], [place] [address]      Provider Number: [id number]  Attending Physician Name and Address:  [name], MD  Relative Name and Phone Number:  Erlene Quan [phone]    Current Level of Care: Hospital Recommended Level of Care: [place] Prior Approval Number:    Date Approved/Denied:   PASRR Number: [id number]  Discharge Plan: SNF    Current Diagnoses: Patient Active Problem List   Diagnosis Date Noted  . Cellulitis of leg 12/14/2019  . Atrial fibrillation with RVR ([place]) 12/14/2019  . Diabetic glomerulopathy ([place]) 10/15/2019  . 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[place]) 10/15/2019  . Gout attack 10/06/2019  . Urinary incontinence 07/25/2019  . Shoulder bursitis 07/25/2019  . Acute renal failure superimposed on stage 3 chronic kidney disease ([place]) 07/23/2019  . Mixed hyperlipidemia 07/23/2019  . Absence epileptic syndrome, not intractable, without status epilepticus ([place]) 07/23/2019  . Atherosclerotic heart disease 07/23/2019  . Diabetic neuropathy ([place]) 07/23/2019  . Benign enlargement of prostate 07/23/2019  . COPD (chronic obstructive pulmonary disease) ([place]) 07/23/2019  . Long term (current) use of anticoagulants 07/23/2019  . Morbid obesity ([place]) 07/23/2019  . Chronic venous hypertension (idiopathic) with ulcer of left lower extremity ([place]) 07/23/2019  . Obstructive sleep apnea 07/23/2019  . Incontinence without sensory awareness 07/23/2019  . Chronic respiratory failure with hypoxia ([place]) 07/23/2019  . Body mass index 50.0-59.9, adult ([place]) 07/23/2019  . H/O acute myocardial infarction 01/22/2018  . Essential hypertension 01/22/2018  . H/O: stroke  01/22/2018  . History of DVT (deep vein thrombosis) 01/22/2018  . Lymphedema of both lower extremities 01/22/2018  . Peripheral vascular disease, unspecified ([place]) 07/12/2011    Orientation RESPIRATION BLADDER Height & Weight     Self, Time, Situation, Place  O2 (Nasal Cannula 2 liters) Continent, External catheter (External Urinary Catheter) Weight: (!) 313 lb 11.4 oz (142.3 kg) Height:  6' (182.9 cm)  BEHAVIORAL SYMPTOMS/MOOD NEUROLOGICAL BOWEL NUTRITION STATUS      Continent Diet (See Discharge Summary)  AMBULATORY STATUS COMMUNICATION OF NEEDS Skin   Limited Assist Verbally Skin abrasions, Other (Comment), Surgical wounds (Appropriate for ethnicity,dry,cellulitis skin tear abrasion abdomen leg right left cellulitis leg left right lower skin tear hand right posterior wound/incision open or dehiced nonpressure wound)                       Personal Care Assistance Level of Assistance  Bathing, Feeding, Dressing Bathing Assistance: Limited assistance Feeding assistance: Independent (Carb Modified cardiac;able to feed self) Dressing Assistance: Limited assistance     Functional Limitations Info  Sight, Hearing, Speech Sight Info: Impaired Hearing Info: Impaired Speech Info: Adequate    SPECIAL CARE FACTORS FREQUENCY  PT (By licensed PT), OT (By licensed OT)     PT Frequency: 5x min weekly OT Frequency: 5x min weekly            Contractures Contractures Info: Not present    Additional Factors Info  Code Status, Allergies Code Status Info: FULL Allergies Info: Codeine           Current Medications (12/16/2019):  This is the current hospital active medication list Current Facility-Administered Medications  Medication Dose Route Frequency Provider Last Rate Last Admin  .  0.9 %  sodium chloride infusion  250 mL Intravenous PRN Opyd, Ilene Qua, MD      . acetaminophen (TYLENOL) tablet 650 mg  650 mg Oral Q6H PRN Opyd, Ilene Qua, MD       Or  . acetaminophen  (TYLENOL) suppository 650 mg  650 mg Rectal Q6H PRN Opyd, Ilene Qua, MD      . albuterol (PROVENTIL) (2.5 MG/3ML) 0.083% nebulizer solution 3 mL  3 mL Inhalation Q6H PRN Opyd, Ilene Qua, MD      . amiodarone (NEXTERONE PREMIX) 360-4.14 MG/200ML-% (1.8 mg/mL) IV infusion  30 mg/hr Intravenous Continuous Nipp, Carriel T, MD 16.67 mL/hr at 12/16/19 0900 30 mg/hr at 12/16/19 0900  . [START ON 12/17/2019] amiodarone (PACERONE) tablet 400 mg  400 mg Oral BID Dorothy Spark, MD       Followed by  . [START ON 12/22/2019] amiodarone (PACERONE) tablet 200 mg  200 mg Oral BID Dorothy Spark, MD       Followed by  . [START ON 12/27/2019] amiodarone (PACERONE) tablet 200 mg  200 mg Oral Daily Ena Dawley H, MD      . amLODipine (NORVASC) tablet 10 mg  10 mg Oral Daily [name], MD   10 mg at 12/16/19 0956  . carvedilol (COREG) tablet 3.125 mg  3.125 mg Oral BID WC Dorothy Spark, MD   3.125 mg at 12/16/19 1333  . furosemide (LASIX) injection 40 mg  40 mg Intravenous BID Adhikari, Amrit, MD      . hydrALAZINE (APRESOLINE) tablet 50 mg  50 mg Oral Q6H PRN Opyd, Ilene Qua, MD   50 mg at 12/15/19 1043  . hydrALAZINE (APRESOLINE) tablet 75 mg  75 mg Oral Q8H Theora Gianotti, NP   75 mg at 12/16/19 1333  . HYDROcodone-acetaminophen (NORCO/VICODIN) 5-325 MG per tablet 1 tablet  1 tablet Oral Q6H PRN Opyd, Ilene Qua, MD      . insulin aspart (novoLOG) injection 0-5 Units  0-5 Units Subcutaneous QHS Opyd, Ilene Qua, MD   3 Units at 12/14/19 2205  . insulin aspart (novoLOG) injection 0-9 Units  0-9 Units Subcutaneous TID WC Opyd, Ilene Qua, MD   3 Units at 12/16/19 [phone]  . insulin glargine (LANTUS) injection 8 Units  8 Units Subcutaneous Daily Adhikari, Amrit, MD      . ondansetron (ZOFRAN) tablet 4 mg  4 mg Oral Q6H PRN Opyd, Ilene Qua, MD       Or  . ondansetron (ZOFRAN) injection 4 mg  4 mg Intravenous Q6H PRN Opyd, Ilene Qua, MD      . phenytoin (DILANTIN) ER capsule 300 mg  300 mg  Oral BID Franky Macho, RPH   300 mg at 12/16/19 0956  . pravastatin (PRAVACHOL) tablet 40 mg  40 mg Oral q1800 Theora Gianotti, NP      . senna-docusate (Senokot-S) tablet 1 tablet  1 tablet Oral QHS PRN Opyd, Ilene Qua, MD      . sodium chloride flush (NS) 0.9 % injection 3 mL  3 mL Intravenous Q12H Opyd, Ilene Qua, MD   3 mL at 12/15/19 2200  . sodium chloride flush (NS) 0.9 % injection 3 mL  3 mL Intravenous Q12H Opyd, Ilene Qua, MD   3 mL at 12/15/19 2147  . sodium chloride flush (NS) 0.9 % injection 3 mL  3 mL Intravenous PRN Opyd, Ilene Qua, MD      . warfarin (COUMADIN) tablet 1 mg  1  mg Oral ONCE-1600 Priscella Mann, RPH      . Warfarin - Pharmacist Dosing Inpatient   Does not apply [place], [facility]         Discharge Medications: Please see discharge summary for a list of discharge medications.  Relevant Imaging Results:  Relevant Lab Results:   Additional Information [id number]  Trula Ore, LCSWA

## 2019-12-16 NOTE — TOC Initial Note (Signed)
Transition of Care [facility]) - Initial/Assessment Note    Patient Details  Name: Matthew Gutierrez MRN: [id number] Date of Birth: [birth year]  Transition of Care [facility]) CM/SW Contact:    Trula Ore, [place] Phone Number: 12/16/2019, 1:43 PM  Clinical Narrative:                  CSW spoke with patient at bedside. Patient was agreeable to SNF placement. Patient gave CSW permission to fax initial referral near the [place] area. Patient gave CSW permission to discuss his care with his son Erlene Quan.  Pending bed offers. CSW will start insurance authorization for patient.  CSW will continue to follow.  Expected Discharge Plan: Skilled Nursing Facility Barriers to Discharge: Continued Medical Work up   Patient Goals and CMS Choice Patient states their goals for this hospitalization and ongoing recovery are:: to go to SNF CMS Medicare.gov Compare Post Acute Care list provided to:: Patient Choice offered to / list presented to : Patient  Expected Discharge Plan and Services Expected Discharge Plan: [place]       Living arrangements for the past 2 months: Single Family Home                                      Prior Living Arrangements/Services Living arrangements for the past 2 months: Single Family Home Lives with:: Self Patient language and need for interpreter reviewed:: Yes        Need for Family Participation in Patient Care: Yes (Comment) Care giver support system in place?: Yes (comment)   Criminal Activity/Legal Involvement Pertinent to Current Situation/Hospitalization: No - Comment as needed  Activities of Daily Living Home Assistive Devices/Equipment: Eyeglasses, Walker (specify type) ADL Screening (condition at time of admission) Patient's cognitive ability adequate to safely complete daily activities?: Yes Is the patient deaf or have difficulty hearing?: No Does the patient have difficulty seeing, even when wearing glasses/contacts?:  No Does the patient have difficulty concentrating, remembering, or making decisions?: No Patient able to express need for assistance with ADLs?: Yes Does the patient have difficulty dressing or bathing?: Yes Independently performs ADLs?: Yes (appropriate for developmental age) Does the patient have difficulty walking or climbing stairs?: No Weakness of Legs: Both Weakness of Arms/Hands: None  Permission Sought/Granted Permission sought to share information with : Case Manager, Family Supports, Chartered certified accountant granted to share information with : Yes, Verbal Permission Granted  Share Information with NAME: Erlene Quan  Permission granted to share info w AGENCY: SNF  Permission granted to share info w Relationship: Son  Permission granted to share info w Contact Information: Erlene Quan [phone]  Emotional Assessment Appearance:: Appears stated age Attitude/Demeanor/Rapport: Gracious Affect (typically observed): Calm Orientation: : Oriented to Self, Oriented to Place, Oriented to  Time, Oriented to Situation Alcohol / Substance Use: Not Applicable Psych Involvement: No (comment)  Admission diagnosis:  Acute renal failure superimposed on stage 3 chronic kidney disease ([place]) [N17.9, N18.30] Patient Active Problem List   Diagnosis Date Noted  . Cellulitis of leg 12/14/2019  . Atrial fibrillation with RVR ([place]) 12/14/2019  . Diabetic glomerulopathy ([place]) 10/15/2019  . 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[place]) 10/15/2019  . Gout attack 10/06/2019  . Urinary incontinence 07/25/2019  . Shoulder bursitis 07/25/2019  . Acute renal failure superimposed on stage 3 chronic kidney disease ([place]) 07/23/2019  . Mixed hyperlipidemia 07/23/2019  . Absence epileptic syndrome, not intractable, without status epilepticus ([place])  07/23/2019  . Atherosclerotic heart disease 07/23/2019  . Diabetic neuropathy ([place]) 07/23/2019  . Benign enlargement of prostate 07/23/2019  . COPD (chronic  obstructive pulmonary disease) ([place]) 07/23/2019  . Long term (current) use of anticoagulants 07/23/2019  . Morbid obesity ([place]) 07/23/2019  . Chronic venous hypertension (idiopathic) with ulcer of left lower extremity ([place]) 07/23/2019  . Obstructive sleep apnea 07/23/2019  . Incontinence without sensory awareness 07/23/2019  . Chronic respiratory failure with hypoxia ([place]) 07/23/2019  . Body mass index 50.0-59.9, adult ([place]) 07/23/2019  . H/O acute myocardial infarction 01/22/2018  . Essential hypertension 01/22/2018  . H/O: stroke 01/22/2018  . History of DVT (deep vein thrombosis) 01/22/2018  . Lymphedema of both lower extremities 01/22/2018  . Peripheral vascular disease, unspecified ([place]) 07/12/2011   PCP:  Lillard Anes, MD Pharmacy:   [facility] - [place], [place] [address] Phone: [phone] Fax: [place], [address] [place] [address] Phone: [phone] Fax: [phone]     Social Determinants of Health (SDOH) Interventions    Readmission Risk Interventions No flowsheet data found.

## 2019-12-16 NOTE — TOC Progression Note (Signed)
Transition of Care [facility]) - Progression Note    Patient Details  Name: [name] MRN: [id number] Date of Birth: 1955-08-24  Transition of Care [facility]) CM/SW [place], [place] Phone Number: 12/16/2019, 2:06 PM  Clinical Narrative:     CSW started insurance authorization for patient. Start date is for tomorrow 7/20. Reference number is # [id number].  Pending bed offers. Pending insurance authorization.  CSW will continue to follow.  Expected Discharge Plan: [place] Barriers to Discharge: Continued Medical Work up  Expected Discharge Plan and Services Expected Discharge Plan: [place] arrangements for the past 2 months: Single Family Home                                       Social Determinants of Health (SDOH) Interventions    Readmission Risk Interventions No flowsheet data found.

## 2019-12-16 NOTE — Progress Notes (Signed)
Pt converted to NSR with pac's @ approximally 0410

## 2019-12-16 NOTE — Progress Notes (Signed)
Inpatient Diabetes Program Recommendations  AACE/ADA: New Consensus Statement on Inpatient Glycemic Control   Target Ranges:  Prepandial:   less than 140 mg/dL      Peak postprandial:   less than 180 mg/dL (1-2 hours)      Critically ill patients:  140 - 180 mg/dL   Results for BRAEDAN, MEUTH (MRN [id number]) as of 12/16/2019 11:22  Ref. Range 12/15/2019 06:12 12/15/2019 11:07 12/15/2019 16:45 12/15/2019 20:47 12/16/2019 06:02  Glucose-Capillary Latest Ref Range: 70 - 99 mg/dL 194 (H) 225 (H) 211 (H) 188 (H) 226 (H)   Review of Glycemic Control  Diabetes history: DM2 Outpatient Diabetes medications: Metformin 1000 mg BID Current orders for Inpatient glycemic control: Novolog 0-9 units TID with meals, Novolog 0-5 units QHS  Inpatient Diabetes Program Recommendations:    Insulin-Please consider ordering Lantus 8 units Q24H.  Thanks, Barnie Alderman, RN, MSN, CDE Diabetes Coordinator Inpatient Diabetes Program [phone] (Team Pager from 8am to 5pm)

## 2019-12-16 NOTE — Chronic Care Management (AMB) (Deleted)
Chronic Care Management Pharmacy  Name: Matthew Gutierrez  MRN: [id number] DOB: 03-25-1956  Chief Complaint/ HPI    Matthew Gutierrez,  64 y.o. , male presents for their Follow-Up CCM visit with the clinical pharmacist via telephone due to COVID-19 Pandemic.  PCP : Lillard Anes, MD  Their chronic conditions include: HTN, COPD, Sleep Apnea, Atherosclerotic heart disease, DM, CKD, Hx of DVT/Stroke and bowel/urinary Incontinence.   Office Visits: 11/15/2019 - repeat shoulder injection due to bursitis/decreased ROM.  10/30/2019 - shoulder steroid injection given. Patient could not raise shoulder.   10/22/2019 - kenalog injection and percocet for adhesive capsulitis.   10/15/2019 - Chronic anemia, Glucose 289, kidney tests stable, low albumin, liver tests normal, triglycerides high at 290. A1c 8.9 we will continue to work on this, INR 1.9. 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 Recent gout sent him to the hospital.   10/04/2019 - pred pack 6 day.   07/25/2019 - Dr. Henrene Pastor incontinence of bladder and stool. Stop all bowel laxatives and magnesium. Referral to GI and Urology.   06/07/2019 - Patient seen for chronic disease visit. Has foot ulcer. Labs checked. CBC revealed chronic low RBC/HGB, CMP revealed GFR 43 and elevated Glucose/SCr,BUN. A1C - 8.4%. Lipid panel - TC 239, TG 418, LDL 124 HDL 41.   Consult Visit: 12/14/2019 [facility] admission.  08/15/2019 - Dr. Lyda Jester recommended taking benefiber daily and miralax prn if no bowel movement by supper. Patient to record all BM and Miralax to report at follow-up visit in 6 weeks.   07/19/2019 - results for diabetic eye exam with no retinopathy.   Medications: Facility-Administered Encounter Medications as of 12/17/2019  Medication  . 0.9 %  sodium chloride infusion  . acetaminophen (TYLENOL) tablet 650 mg   Or  . acetaminophen (TYLENOL) suppository 650 mg  . albuterol (PROVENTIL) (2.5 MG/3ML) 0.083%  nebulizer solution 3 mL  . amiodarone (NEXTERONE PREMIX) 360-4.14 MG/200ML-% (1.8 mg/mL) IV infusion  . amLODipine (NORVASC) tablet 10 mg  . hydrALAZINE (APRESOLINE) tablet 50 mg  . hydrALAZINE (APRESOLINE) tablet 50 mg  . HYDROcodone-acetaminophen (NORCO/VICODIN) 5-325 MG per tablet 1 tablet  . insulin aspart (novoLOG) injection 0-5 Units  . insulin aspart (novoLOG) injection 0-9 Units  . ondansetron (ZOFRAN) tablet 4 mg   Or  . ondansetron (ZOFRAN) injection 4 mg  . phenytoin (DILANTIN) ER capsule 300 mg  . senna-docusate (Senokot-S) tablet 1 tablet  . sodium chloride flush (NS) 0.9 % injection 3 mL  . sodium chloride flush (NS) 0.9 % injection 3 mL  . sodium chloride flush (NS) 0.9 % injection 3 mL  . Warfarin - Pharmacist Dosing Inpatient   Outpatient Encounter Medications as of 12/17/2019  Medication Sig  . allopurinol (ZYLOPRIM) 300 MG tablet Take 1 tablet (300 mg total) by mouth daily.  Marland Kitchen atenolol-chlorthalidone (TENORETIC) 100-25 MG tablet Take 1 tablet by mouth daily.  . fesoterodine (TOVIAZ) 4 MG TB24 tablet Take 4 mg by mouth daily.  . fluticasone (FLONASE) 50 MCG/ACT nasal spray Place 1 spray into both nostrils daily.  . furosemide (LASIX) 20 MG tablet Take 20 mg by mouth daily.  Marland Kitchen gabapentin (NEURONTIN) 300 MG capsule Take 1 capsule (300 mg total) by mouth at bedtime.  . hydrALAZINE (APRESOLINE) 10 MG tablet Take 2 tablets (20 mg total) by mouth 2 (two) times daily with a meal.  . lisinopril (ZESTRIL) 10 MG tablet Take 1 tablet (10 mg total) by mouth daily.  . metFORMIN (GLUCOPHAGE) 1000 MG  tablet Take 1 tablet (1,000 mg total) by mouth 2 (two) times daily with a meal.  . oxyCODONE-acetaminophen (PERCOCET) 10-325 MG tablet Take 1 tablet by mouth every 6 (six) hours as needed for pain. (Patient not taking: Reported on 12/14/2019)  . pravastatin (PRAVACHOL) 40 MG tablet Take 1 tablet (40 mg total) by mouth daily.  Marland Kitchen triamcinolone cream (KENALOG) 0.1 % APPLY TO THE AFFECTED  AREA(S) TWICE DAILY AS NEEDED (Patient taking differently: Apply 1 application topically 2 (two) times daily as needed (irritation). )  . warfarin (COUMADIN) 3 MG tablet Take 1 tablet (3 mg total) by mouth daily. (Patient not taking: Reported on 12/14/2019)  . warfarin (COUMADIN) 4 MG tablet Take 1 tablet (4 mg total) by mouth daily. (Patient taking differently: Take 4 mg by mouth daily at 4 PM. )     Current Diagnosis/Assessment:  Goals Addressed   None     Diabetes   Recent Relevant Labs: Lab Results  Component Value Date/Time   HGBA1C 8.3 (H) 12/14/2019 02:33 AM   HGBA1C 8.9 (H) 10/15/2019 10:14 AM   HGBA1C 8.4 06/07/2019 12:00 AM   HGBA1C 8.0 11/12/2018 12:00 AM   MICROALBUR 150 10/15/2019 10:05 AM   MICROALBUR 150 07/20/2017 12:00 AM     Checking BG: Weekly - patient says really painful to check so doing a few times weekly.   Recent FBG Readings: 140-150 mg/dL Patient has failed these medicatioin past: glimepiride Patient is currently uncontrolled on the following medications: Metformin 1000 mg bid  Last diabetic Foot exam: Dr. Henrene Pastor checks in office.  Last eye exam - Due now and has a card to make an appointment.   We discussed: how to recognize and treat signs of hypoglycemia. Home health nurse is also checking feet. Diarrhea issue could be improved from eating with Metformin.   Update 11/08/2019 - Blood sugar was 153 mg/dL yesterday am. Was 200 mg/dL when [facility] checked the other day. Drinks a lot of diet drinks and can't stay away from sugar. Loves potatoes and starch.   Update 12/05/2019 - Blood sugar is high. He is hospitalized currently. He is having some trembling that he wants Dr. Henrene Pastor to help him resolve this once he is discharged.   Plan Try taking metformin with food to avoid diarrhea.  Continue current medications,  Hypertension   Office blood pressures are  BP Readings from Last 3 Encounters:  12/16/19 137/61  11/15/19 (!) 170/80  10/30/19 (!) 150/90     Lab Results  Component Value Date   CREATININE 2.37 (H) 12/16/2019   CREATININE 2.30 (H) 12/15/2019   CREATININE 2.45 (H) 12/14/2019   Kidney Function Lab Results  Component Value Date/Time   CREATININE 2.37 (H) 12/16/2019 04:27 AM   CREATININE 2.30 (H) 12/15/2019 03:59 AM   GFRNONAA 28 (L) 12/16/2019 04:27 AM   GFRAA 32 (L) 12/16/2019 04:27 AM    Patient has failed these meds in the past: none that he can remember at this time.  Patient is currently uncontrolled on the following medications: atenolol-chlorthalidone 100-25 mg daily, furosemide 20 mg daily, hydralazine 10 mg 2tabs bid, lisinopril 10 mg daily  Patient checks BP at home several times per month  Patient home BP readings are ranging: 150/100  We discussed diet and exercise extensively. Patient admits he probably doesn't eat right. Eats bacon, sausage, eggs regularly. Admits he needs to be "off of sodium" due to legs swelling.   Update 11/08/2019: High blood pressure runs in his family. States it has  always been high. He sees Dr. Henrene Pastor 06/14 and will discuss.   Update 12/05/2019 - Patient reports blood pressure is well controlled currently in hospital.   Plan  Continue current medications. Will try to cut back on sodium intake.   Hyperlipidemia/Hx of Stroke  Hx. Of stroke. Has had some stent.   Lipid Panel     Component Value Date/Time   CHOL 140 10/15/2019 1014   TRIG 290 (H) 10/15/2019 1014   HDL 34 (L) 10/15/2019 1014   CHOLHDL 4.1 10/15/2019 1014   LDLCALC 60 10/15/2019 1014   LABVLDL 46 (H) 10/15/2019 1014     Patient has failed these meds in past: omega 3 fatty acids 1000 mg Patient is currently uncontrolled on the following medications: pravastatin 40 mg daily  We discussed:  diet and exercise extensively. We also discussed moving pravastatin to evening medications.   Update 11/08/2019: Recommend patient work to reduce fat sources in diet. Work to control blood sugar to improve TG.    Plan  Continue current medications    And Bowel/Urinary Incontinence:    Patient has failed these meds in past: laxatives, magnesium Patient is currently controlled on the following medications: Benefiber daily, Miralax PRN - haven't used yet, Toviaz samples  We discussed:  trying metofrmin with food to improve diarrhea. Discussing with urology about generic alternative for Lisbeth Ply if continued long-term. Lisbeth Ply is new to patient and MD provided samples for now.    Update 11/08/2019 - Patient is still dealing with urinary incontinence as well as bowel issues. He will follow-up with Dr. Nila Nephew soon for results of recent test.   Plan  Continue current medications    Gout   Kidney Function Lab Results  Component Value Date/Time   CREATININE 2.37 (H) 12/16/2019 04:27 AM   CREATININE 2.30 (H) 12/15/2019 03:59 AM   GFRNONAA 28 (L) 12/16/2019 04:27 AM   GFRAA 32 (L) 12/16/2019 04:27 AM     Patient has failed these meds in past: colchicine Patient is currently uncontrolled on the following medications:  . Allopurinol 300 mg daily  We discussed 11/08/2019:  Patient reports that medication seems to be improving symptoms although still having discomfort in shoulders and arms.   Plan  Continue current medications   Medication Management   Pt uses [place] for all medications Using weekly pill box. Discussed packaging option but patient does not feel needed at this time.  Pt endorses good compliance  We discussed:  Continuing to use medication organizer to keep with good compliance. Patient is very cautious taking phenytoin as directed to avoid a seizure.   Plan  Continue current medication regimen.     Follow up: 1 month

## 2019-12-16 NOTE — Progress Notes (Signed)
Pt went into afib 140's-160's BP 170's/110's 12 lead EKG to confirm, Dr Blossom Hoops paged to notify he said he would Review his records and return call. Dr Blossom Hoops returned call and ordered  Amiodarone GTT no bolus. Pt adamant to be in chair, I explained to patient that it was safer to be in the bed, pt continues to insist call bell within reach

## 2019-12-16 NOTE — Progress Notes (Addendum)
Progress Note  Patient Name: Matthew Gutierrez Date of Encounter: 12/16/2019  Primary Cardiologist: Jenne Campus, MD  Subjective   Recurrent afib into the 160's between 00:56 and 04:08 overnight.  Broke w/ resumption of IV amio.  No c/p or sob this AM.  Chronic orthopnea - sleeps in recliner @ home and here.  Inpatient Medications    Scheduled Meds: . amLODipine  10 mg Oral Daily  . hydrALAZINE  50 mg Oral Q8H  . insulin aspart  0-5 Units Subcutaneous QHS  . insulin aspart  0-9 Units Subcutaneous TID WC  . phenytoin  300 mg Oral BID  . sodium chloride flush  3 mL Intravenous Q12H  . sodium chloride flush  3 mL Intravenous Q12H  . warfarin  1 mg Oral ONCE-1600  . Warfarin - Pharmacist Dosing Inpatient   Does not apply q1600   Continuous Infusions: . sodium chloride    . amiodarone 30 mg/hr (12/16/19 0900)   PRN Meds: sodium chloride, acetaminophen **OR** acetaminophen, albuterol, hydrALAZINE, HYDROcodone-acetaminophen, ondansetron **OR** ondansetron (ZOFRAN) IV, senna-docusate, sodium chloride flush   Vital Signs    Vitals:   12/16/19 0400 12/16/19 0520 12/16/19 0600 12/16/19 0830  BP: (!) 126/108 (!) 163/86 137/61 (!) 188/82  Pulse:    72  Resp: 20 19 20  (!) 24  Temp:    (!) 97.5 F (36.4 C)  TempSrc:    Oral  SpO2:  95%  99%  Weight:      Height:        Intake/Output Summary (Last 24 hours) at 12/16/2019 0959 Last data filed at 12/16/2019 0600 Gross per 24 hour  Intake 451.6 ml  Output 1200 ml  Net -748.4 ml   Filed Weights   12/15/19 1527 12/16/19 0300  Weight: (!) 142.4 kg (!) 142.3 kg    Physical Exam   GEN: Obese, in no acute distress.  HEENT: Grossly normal.  Neck: Supple, obese, difficult to gauge JVP, no carotid bruits, or masses. Cardiac: RRR, no murmurs, rubs, or gallops. No clubbing, cyanosis.  2-3+ bilat LE edema to the mid thighs.  Radials 2+ equal bilaterally.  Respiratory:  Respirations regular and unlabored, bibasilar crackles. GI:  Obese, firm, nontender, BS + x 4. MS: no deformity or atrophy. Skin: warm and dry, no rash. Neuro:  Strength and sensation are intact. Psych: AAOx3.  Normal affect.  Labs    Chemistry Recent Labs  Lab 12/14/19 0233 12/15/19 0359 12/16/19 0427  NA 135 136 134*  K 4.3 4.6 4.7  CL 89* 91* 90*  CO2 33* 30 31  GLUCOSE 195* 216* 226*  BUN 52* 50* 47*  CREATININE 2.45* 2.30* 2.37*  CALCIUM 8.2* 8.6* 8.9  PROT 7.0  --   --   ALBUMIN 2.2*  --   --   AST 14*  --   --   ALT 9  --   --   ALKPHOS 183*  --   --   BILITOT 0.5  --   --   GFRNONAA 27* 29* 28*  GFRAA 31* 34* 32*  ANIONGAP 13 15 13      Hematology Recent Labs  Lab 12/14/19 0233 12/15/19 0359 12/16/19 0427  WBC 14.9* 13.6* 14.9*  RBC 3.22* 3.33* 3.30*  HGB 9.3* 9.7* 9.6*  HCT 30.1* 32.0* 31.7*  MCV 93.5 96.1 96.1  MCH 28.9 29.1 29.1  MCHC 30.9 30.3 30.3  RDW 14.3 14.7 14.6  PLT 277 257 304     BNP Recent Labs  Lab 12/15/19  0359  BNP 240.9*      Lipids  Lab Results  Component Value Date   CHOL 140 10/15/2019   HDL 34 (L) 10/15/2019   LDLCALC 60 10/15/2019   TRIG 290 (H) 10/15/2019   CHOLHDL 4.1 10/15/2019    HbA1c  Lab Results  Component Value Date   HGBA1C 8.3 (H) 12/14/2019   Lab Results  Component Value Date   INR 2.9 (H) 12/16/2019   INR 2.1 (H) 12/15/2019   INR 1.9 (H) 12/14/2019     Radiology    None  Telemetry    Sinus rhythm  afib to 160s between 00:56  04:08  sinus rhythm - Personally Reviewed  Cardiac Studies   2D Echocardiogram 7.18.2021   1. Poor acoustic windows limit study Left ventricular ejection fraction,  by estimation, is 60 to 65%. The left ventricle has normal function. Left  ventricular endocardial border not optimally defined to evaluate regional  wall motion. The left  ventricular internal cavity size was mildly dilated. Left ventricular  diastolic function could not be evaluated.   2. Right ventricular systolic function is grossly normal. The right   ventricular size is normal.   3. Pericardial effusion is mainly posterior to LV 14 mm maximal  dimenstion. No evid of filling compromise. . Moderate pericardial  effusion.   4. The mitral valve is normal in structure. No evidence of mitral valve  regurgitation.   5. The aortic valve is tricuspid. Aortic valve regurgitation is not  visualized. Mild aortic valve sclerosis is present, with no evidence of  aortic valve stenosis.   6. The inferior vena cava is dilated in size with >50% respiratory  variability, suggesting right atrial pressure of 8 mmHg.    Patient Profile     64 y.o. male with a hx of HTN, HLD, DM, COPD on home O2, CKD III, CVA, DVT who is being seen for the evaluation of paroxysmal atrial fibrillation with RVR at the request of [name], MD.  [place]    1.  Paroxysmal Afib w RVR:  Initially admitted to [facility] w/ LLE cellulitis and PAF w/ RVR.  He initially converted on IV dilt but then required IV amio with resultant bradycardia and switch to oral amio @ 200mg  BID.  He had recurrent AFib overnight (00:56  04:08 in 160's; 1.59 sec pause @ termination) and was placed back on IV amio w/ conversion to sinus rhythm.  He denies recurrent c/p but notes tachycardia and dyspnea w/ lightheadedness when afib recurs.  Currently maintaining sinus rhythm.  Will cont IV amio for now for ongoing loading but would plan to transition back to PO amio later today vs tomorrow.  INR Rx @ 2.9.  CHA2DS2VASc = at least 5.  2.  Demand Ischemia/Elevated troponin:  Trop mildly elevated @ OSH (0.06  0.11  0.13  0.11) in the setting of rapid afib, AKI on CKD III, and CHF.  Suspect demand ischemia.  Echo w/ nl EF.  As previously noted, will need ischemic eval at some point, but given size and need for a 2 day study, I suspect that this can be deferred to the outpt setting, esp as he is not a cath candidate at this time w/ AKI.  No ASA in setting of warfarin, no  blocker in setting  of bradycardia when in sinus rhythm.  Resume home dose of pravastatin.  3.  Acute diastolic CHF:  Nl EF by echo here.  Minus 1L overnight and  2.4L since admission despite holding diuretics since admission to [facility].  He is volume overloaded on exam with bibasilar crackles, lower ext edema to mid-thighs, and inc abd girth/firmness.  Creat has been relatively stable.  Consider nephrology consultation to help guide diuretic therapy.  Needs outpt sleep eval.  Would also consider outpt amyloid w/u given afib, renal failure, diast dysfxn.  4.  Essential HTN:  Poorly controlled.  Home dose of ACEI on hold in setting of AKI.  Currently only on amlodipine 10 and hydralazine 50 q8h.  Will titrate hydralazine to 75mg  q8h.  4.  HL:  LDL 60 in May on prava 40 - resume.  5.  AKI/CKD III:  Creat relatively stable this AM. Prev peaked @ 4.8 @ [facility]. Acei/diuretics remain on hold.  Consider nephrology consultation given volume overload and need for diuresis.  6.  LLE Cellulitis:  Completed course of clindamycin @ OSH. WBC remain mildly elevated.  Afebrile.  7.  COPD:  Uses O2 @ home - 2lpm.  No wheezing.  8.  Probable OSA:  Needs outpt sleep eval.  Signed, Murray Hodgkins, NP  12/16/2019, 9:59 AM    For questions or updates, please contact   Please consult [email or web address] for contact info under Cardiology/STEMI.   The patient was seen, examined and discussed with Carmela Rima, NP and agree as above.  Remains in SR, I would continue iv amiodarone till tomorrow and switch to PO in the am, 400 mg PO x 5 days, then 200 mg PO x 5 days followed by 200 mg PO daily. H remains fluid overloaded, consider nephrology consult given CKD stage 4, for now start lasix 40 mg iv BID, He is also hypertensive, increase hydralazine to 75 mg po TID and start carvedilol 3.125 mg po bid.   Ena Dawley, MD 12/16/2019

## 2019-12-17 ENCOUNTER — Telehealth: Payer: Medicare HMO

## 2019-12-17 DIAGNOSIS — L03119 Cellulitis of unspecified part of limb: Secondary | ICD-10-CM

## 2019-12-17 DIAGNOSIS — J9611 Chronic respiratory failure with hypoxia: Secondary | ICD-10-CM | POA: Diagnosis not present

## 2019-12-17 DIAGNOSIS — N179 Acute kidney failure, unspecified: Secondary | ICD-10-CM | POA: Diagnosis not present

## 2019-12-17 DIAGNOSIS — I10 Essential (primary) hypertension: Secondary | ICD-10-CM | POA: Diagnosis not present

## 2019-12-17 DIAGNOSIS — I5033 Acute on chronic diastolic (congestive) heart failure: Secondary | ICD-10-CM

## 2019-12-17 DIAGNOSIS — I4891 Unspecified atrial fibrillation: Secondary | ICD-10-CM | POA: Diagnosis not present

## 2019-12-17 DIAGNOSIS — R778 Other specified abnormalities of plasma proteins: Secondary | ICD-10-CM | POA: Diagnosis not present

## 2019-12-17 LAB — BASIC METABOLIC PANEL
Anion gap: 14 (ref 5–15)
BUN: 49 mg/dL — ABNORMAL HIGH (ref 8–23)
CO2: 31 mmol/L (ref 22–32)
Calcium: 9 mg/dL (ref 8.9–10.3)
Chloride: 89 mmol/L — ABNORMAL LOW (ref 98–111)
Creatinine, Ser: 2.37 mg/dL — ABNORMAL HIGH (ref 0.61–1.24)
GFR calc Af Amer: 32 mL/min — ABNORMAL LOW (ref >60)
GFR calc non Af Amer: 28 mL/min — ABNORMAL LOW (ref >60)
Glucose, Bld: 223 mg/dL — ABNORMAL HIGH (ref 70–99)
Potassium: 4.6 mmol/L (ref 3.5–5.1)
Sodium: 134 mmol/L — ABNORMAL LOW (ref 135–145)

## 2019-12-17 LAB — PROTIME-INR
INR: 3.6 — ABNORMAL HIGH (ref 0.8–1.2)
Prothrombin Time: 34.7 seconds — ABNORMAL HIGH (ref 11.4–15.2)

## 2019-12-17 LAB — GLUCOSE, CAPILLARY
Glucose-Capillary: 209 mg/dL — ABNORMAL HIGH (ref 70–99)
Glucose-Capillary: 210 mg/dL — ABNORMAL HIGH (ref 70–99)
Glucose-Capillary: 182 mg/dL — ABNORMAL HIGH (ref 70–99)
Glucose-Capillary: 160 mg/dL — ABNORMAL HIGH (ref 70–99)

## 2019-12-17 MED ORDER — AMIODARONE HCL IN DEXTROSE 360-4.14 MG/200ML-% IV SOLN
60.0000 mg/h | INTRAVENOUS | Status: AC
  Administered 2019-12-17 (×2): 60 mg/h via INTRAVENOUS
  Filled 2019-12-17 (×2): qty 200

## 2019-12-17 MED ORDER — FUROSEMIDE 10 MG/ML IJ SOLN
40.0000 mg | Freq: Four times a day (QID) | INTRAMUSCULAR | Status: DC
  Administered 2019-12-17 – 2019-12-18 (×4): 40 mg via INTRAVENOUS
  Filled 2019-12-17 (×4): qty 4

## 2019-12-17 MED ORDER — INSULIN ASPART 100 UNIT/ML ~~LOC~~ SOLN
2.0000 [IU] | Freq: Three times a day (TID) | SUBCUTANEOUS | Status: DC
  Administered 2019-12-18 – 2019-12-27 (×20): 2 [IU] via SUBCUTANEOUS

## 2019-12-17 MED ORDER — INSULIN GLARGINE 100 UNIT/ML ~~LOC~~ SOLN
10.0000 [IU] | Freq: Every day | SUBCUTANEOUS | Status: DC
  Administered 2019-12-18 – 2019-12-23 (×6): 10 [IU] via SUBCUTANEOUS
  Filled 2019-12-17 (×6): qty 0.1

## 2019-12-17 NOTE — Progress Notes (Addendum)
Progress Note  Patient Name: Matthew Gutierrez Date of Encounter: 12/17/2019  Primary Cardiologist: Jenne Campus, MD  Subjective   "This is a miserable existence."  Recurrent atrial fibrillation between the hours of midnight and 4 AM this morning with associated dyspnea.  He is very frustrated with his lack of clinical improvement.  He cannot get comfortable in the bed and ends up sitting at the bedside in a recliner all day and night.  He has not had much of an appetite.  He denies chest pain but continues to note dyspnea with minimal activity.  Inpatient Medications    Scheduled Meds: . amiodarone  400 mg Oral BID   Followed by  . [START ON 12/22/2019] amiodarone  200 mg Oral BID   Followed by  . [START ON 12/27/2019] amiodarone  200 mg Oral Daily  . amLODipine  10 mg Oral Daily  . carvedilol  3.125 mg Oral BID WC  . furosemide  40 mg Intravenous BID  . hydrALAZINE  75 mg Oral Q8H  . insulin aspart  0-5 Units Subcutaneous QHS  . insulin aspart  0-9 Units Subcutaneous TID WC  . insulin glargine  8 Units Subcutaneous Daily  . phenytoin  300 mg Oral BID  . pravastatin  40 mg Oral q1800  . sodium chloride flush  3 mL Intravenous Q12H  . Warfarin - Pharmacist Dosing Inpatient   Does not apply q1600   Continuous Infusions: . sodium chloride    . amiodarone 60 mg/hr (12/17/19 0700)   PRN Meds: sodium chloride, acetaminophen **OR** acetaminophen, albuterol, hydrALAZINE, HYDROcodone-acetaminophen, ondansetron **OR** ondansetron (ZOFRAN) IV, senna-docusate, sodium chloride flush   Vital Signs    Vitals:   12/17/19 0300 12/17/19 0400 12/17/19 0610 12/17/19 0700  BP: 120/77 (!) 161/94 (!) 176/67 (!) 124/95  Pulse: (!) 148 (!) 115 (!) 59 64  Resp: (!) 26 (!) 42 15 19  Temp: 97.7 F (36.5 C) 98 F (36.7 C) 97.7 F (36.5 C) 97.9 F (36.6 C)  TempSrc: Oral Oral Oral Oral  SpO2: 93% 91% 94% 97%  Weight:      Height:        Intake/Output Summary (Last 24 hours) at 12/17/2019  0938 Last data filed at 12/17/2019 0700 Gross per 24 hour  Intake 745.62 ml  Output 1275 ml  Net -529.38 ml   Filed Weights   12/15/19 1527 12/16/19 0300 12/17/19 0237  Weight: (!) 142.4 kg (!) 142.3 kg (!) 141.3 kg    Physical Exam   GEN: Obese, in no acute distress.  HEENT: Grossly normal.  Neck: Obese, difficult to gauge JVP.  No carotid bruits, or masses. Cardiac: RRR, no murmurs, rubs, or gallops. No clubbing, cyanosis.  2-3+ bilateral lower extremity edema to the mid posterior thighs.  Radials 2+ and equal bilaterally.  Respiratory:  Respirations regular and unlabored, markedly diminished breath sounds at bilateral bases crackles approximately one third of the way up. GI: Obese, semifirm and edematous, nontender, BS + x 4. MS: no deformity or atrophy. Skin: warm and dry, no rash.  Lower extremities wrapped. Neuro:  Strength and sensation are intact. Psych: AAOx3.  Normal affect.  Labs    Chemistry Recent Labs  Lab 12/14/19 0233 12/14/19 0233 12/15/19 0359 12/16/19 0427 12/17/19 0345  NA 135   < > 136 134* 134*  K 4.3   < > 4.6 4.7 4.6  CL 89*   < > 91* 90* 89*  CO2 33*   < > 30 31 31  GLUCOSE 195*   < > 216* 226* 223*  BUN 52*   < > 50* 47* 49*  CREATININE 2.45*   < > 2.30* 2.37* 2.37*  CALCIUM 8.2*   < > 8.6* 8.9 9.0  PROT 7.0  --   --   --   --   ALBUMIN 2.2*  --   --   --   --   AST 14*  --   --   --   --   ALT 9  --   --   --   --   ALKPHOS 183*  --   --   --   --   BILITOT 0.5  --   --   --   --   GFRNONAA 27*   < > 29* 28* 28*  GFRAA 31*   < > 34* 32* 32*  ANIONGAP 13   < > 15 13 14    < > = values in this interval not displayed.     Hematology Recent Labs  Lab 12/14/19 0233 12/15/19 0359 12/16/19 0427  WBC 14.9* 13.6* 14.9*  RBC 3.22* 3.33* 3.30*  HGB 9.3* 9.7* 9.6*  HCT 30.1* 32.0* 31.7*  MCV 93.5 96.1 96.1  MCH 28.9 29.1 29.1  MCHC 30.9 30.3 30.3  RDW 14.3 14.7 14.6  PLT 277 257 304    BNP Recent Labs  Lab 12/15/19 0359  BNP  240.9*    Lipids  Lab Results  Component Value Date   CHOL 140 10/15/2019   HDL 34 (L) 10/15/2019   LDLCALC 60 10/15/2019   TRIG 290 (H) 10/15/2019   CHOLHDL 4.1 10/15/2019    HbA1c  Lab Results  Component Value Date   HGBA1C 8.3 (H) 12/14/2019    Radiology    ---  Telemetry    Sinus rhythm/sinus brady w/ recurrent afib for the better part of 12a-4a this morning - Personally Reviewed  Cardiac Studies   2D Echocardiogram 7.18.2021  1. Poor acoustic windows limit study Left ventricular ejection fraction,  by estimation, is 60 to 65%. The left ventricle has normal function. Left  ventricular endocardial border not optimally defined to evaluate regional  wall motion. The left  ventricular internal cavity size was mildly dilated. Left ventricular  diastolic function could not be evaluated.  2. Right ventricular systolic function is grossly normal. The right  ventricular size is normal.  3. Pericardial effusion is mainly posterior to LV 14 mm maximal  dimenstion. No evid of filling compromise. . Moderate pericardial  effusion.  4. The mitral valve is normal in structure. No evidence of mitral valve  regurgitation.  5. The aortic valve is tricuspid. Aortic valve regurgitation is not  visualized. Mild aortic valve sclerosis is present, with no evidence of  aortic valve stenosis.  6. The inferior vena cava is dilated in size with >50% respiratory  variability, suggesting right atrial pressure of 8 mmHg.   Patient Profile     64 y.o.malewith a hx of HTN, HLD, DM, COPD on home O2, CKD III, CVA, DVTwho is being seen for the evaluation of paroxysmal atrial fibrillation with RVRat the request of [name], MD.  Assessment & Plan    1.  Paroxysmal atrial fibrillation with rapid ventricular response: Initially admitted to [facility] with left lower extremity cellulitis and paroxysmal A. fib with RVR.  He initially converted on IV diltiazem but then  required IV amiodarone with resultant bradycardia and switch to oral amiodarone at 200 mg twice daily.  He had recurrent atrial fibrillation in the early morning hours of July 19 and again this morning, lasting approximately 4 hours on both occasions.  He is back on IV amiodarone.  He experiences dyspnea and generalized restlessness with recurrent A. fib, at which time rates climb into the 130s to 140s.  He is currently in sinus rhythm in the 50s to 60s.  Upon further questioning, he notes that he was previously diagnosed with sleep apnea but could not tolerate a mask and only uses oxygen via nasal cannula at night.  Suspect sleep apnea playing a role in his nocturnal A. fib.  Difficult situation as he has had recurrent A. fib each time we have tried to transition from intravenous to oral amiodarone.  Limited alternate antiarrhythmic options in the setting of heart failure, demand ischemia, and acute on chronic stage III kidney disease.  Likely a poor catheter ablation candidate given obesity and untreated sleep apnea.  May benefit from EP input - will discuss w/ MD.  2.  Demand ischemia/elevated troponin: Troponin mildly elevated outside hospital with a peak of 0.13 in the setting of rapid atrial fibrillation, acute on chronic stage III kidney disease, and diastolic CHF.  Suspect demand ischemia.  Echo with normal LV function.  Ideally, he will undergo ischemic evaluation given multiple risk factors however, nuclear stress testing not ideal given his body habitus.  He is not catheterization or coronary CTA candidate secondary to chronic kidney disease.  Defer ischemic work-up to the outpatient setting.  Continue low-dose beta-blocker and statin therapy.  No aspirin in the setting of warfarin.  3.  Acute diastolic congestive heart failure: Echo on July 18 with an EF of 60-65%.  We added Lasix 40 mg IV twice daily yesterday given significant volume overload.  Elicited only -952 mL overnight.  His weight is down 1  kg.  Question accuracy of I's and O's.  His renal function is stable with a creatinine of 2.37 this morning-same as yesterday.  Continue IV diuresis today as he remains markedly volume overloaded.  4.  Essential hypertension: Blood pressure remains variable but better this morning at 124/59.  Continue amlodipine, carvedilol, Lasix, and hydralazine at current doses) throughout the day.  5.  Hyperlipidemia: LDL 16 May on pravastatin 40.  6.  Acute on chronic stage III kidney disease: Creatinine stable at 2.37 this morning.  Previously peaked at 4.8 at [place] dose of ACE inhibitor therapy remains on hold.  Consider nephrology consultation if response to diuretics drops her renal function worsens.  7.  Left lower extremity cellulitis: Complete course of clindamycin outside hospital.  Remains afebrile.  8.  COPD: On O2 at 2 L/min at home.  No wheezing.  9.  Obstructive sleep apnea: Says he was previously diagnosed but did not tolerate mask.  Likely playing a role in recurrent A. fib.  Will need outpatient reevaluation.  Signed, Murray Hodgkins, NP  12/17/2019, 9:38 AM    For questions or updates, please contact   Please consult [email or web address] for contact info under Cardiology/STEMI.   The patient was seen, examined and discussed with Ignacia Bayley, NP and agree as above.  The patient failed transition to p.o. amiodarone second time now, IV amiodarone was restarted as he went from sinus bradycardia to A. fib with RVR overnight, he needs to be evaluated for sleep apnea that was previously done however he could not tolerate his mask so he might undergo new feeding.  In the meantime I will discontinue  carvedilol as it seems that combination of IV amiodarone carvedilol causes bradycardia.  He remains significantly fluid overloaded, I will increase his IV Lasix to 40 mg every 6 hours, his creatinine is elevated but stable 2.37 today.  We will ask EP for help with management of his  paroxysmal atrial fibrillation.  Ena Dawley, MD 12/17/2019

## 2019-12-17 NOTE — Progress Notes (Signed)
Physical Therapy Treatment Patient Details Name: Matthew Gutierrez MRN: [id number] DOB: Aug 10, 1955 Today's Date: 12/17/2019    History of Present Illness  64 y.o. male with medical history significant for DVT on warfarin, obesity, lymphedema, peripheral vascular disease, epilepsy, chronic kidney disease stage III, and hypertension who presented to [facility] emergency department on 12/04/2019 for evaluation of increased swelling and redness involving the left lower extremity. Patient admitted [facility] 7/17 Acute kidney injury superimposed on CKD IIIa, Paroxysmal atrial fibrillation, COPD; chronic hypoxic respiratory failure.     PT Comments    Pt making steady progress. Continue to recommend SNF at DC.    Follow Up Recommendations  SNF;Supervision for mobility/OOB     Equipment Recommendations  None recommended by PT    Recommendations for Other Services       Precautions / Restrictions Precautions Precautions: Fall Precaution Comments: monitor O2    Mobility  Bed Mobility               General bed mobility comments: Up in chair  Transfers Overall transfer level: Needs assistance Equipment used: Rolling walker (2 wheeled) Transfers: Sit to/from Stand Sit to Stand: Min assist         General transfer comment: Assist to bring hips up and for balance.   Ambulation/Gait Ambulation/Gait assistance: Min assist;+2 safety/equipment Gait Distance (Feet): 40 Feet (10' x 1, 40' x 1) Assistive device: Rolling walker (2 wheeled) Gait Pattern/deviations: Step-through pattern;Decreased stride length;Trunk flexed;Wide base of support Gait velocity: decr Gait velocity interpretation: <1.8 ft/sec, indicate of risk for recurrent falls General Gait Details: Assist for balance and support and second person following with chair.    Stairs             Wheelchair Mobility    Modified Rankin (Stroke Patients Only)       Balance Overall balance assessment: Needs  assistance Sitting-balance support: Feet supported;No upper extremity supported Sitting balance-Leahy Scale: Fair     Standing balance support: During functional activity;Bilateral upper extremity supported Standing balance-Leahy Scale: Poor Standing balance comment: walker and min guard for static standing                            Cognition Arousal/Alertness: Awake/alert Behavior During Therapy: WFL for tasks assessed/performed Overall Cognitive Status: Impaired/Different from baseline Area of Impairment: Memory;Problem solving                     Memory: Decreased short-term memory       Problem Solving: Requires verbal cues;Requires tactile cues General Comments: Sister noted pt not at baseline. Pt hasn't slept and likely due to that      Exercises      General Comments General comments (skin integrity, edema, etc.): Pt on 2L of O2. VSS      Pertinent Vitals/Pain Pain Assessment: No/denies pain    Home Living                      Prior Function            PT Goals (current goals can now be found in the care plan section) Acute Rehab PT Goals Patient Stated Goal: to get back to walking Progress towards PT goals: Progressing toward goals    Frequency    Min 3X/week      PT Plan Current plan remains appropriate    Co-evaluation  AM-PAC PT "6 Clicks" Mobility   Outcome Measure  Help needed turning from your back to your side while in a flat bed without using bedrails?: A Little Help needed moving from lying on your back to sitting on the side of a flat bed without using bedrails?: A Lot Help needed moving to and from a bed to a chair (including a wheelchair)?: A Lot Help needed standing up from a chair using your arms (e.g., wheelchair or bedside chair)?: A Little Help needed to walk in hospital room?: A Little Help needed climbing 3-5 steps with a railing? : Total 6 Click Score: 14    End of Session  Equipment Utilized During Treatment: Oxygen (pt too large for gait belt) Activity Tolerance: Patient tolerated treatment well Patient left: in chair;with call bell/phone within reach;with family/visitor present Nurse Communication: Mobility status PT Visit Diagnosis: Muscle weakness (generalized) (M62.81);Difficulty in walking, not elsewhere classified (R26.2)     Time: 1314-3888 PT Time Calculation (min) (ACUTE ONLY): 17 min  Charges:  $Gait Training: 8-22 mins                     [place] Pager [phone] Office [place] 12/17/2019, 4:40 PM

## 2019-12-17 NOTE — TOC Progression Note (Addendum)
Transition of Care [facility]) - Progression Note    Patient Details  Name: Matthew Gutierrez MRN: [id number] Date of Birth: [birth year]  [facility] CM/SW [place], [place] Phone Number: 12/17/2019, 9:24 AM  Clinical Narrative:    CSW spoke with patient in an attempt to provide SNF bed offers. Patient expressed he is not doing well and does not know what is going on right now. CSW asked patient if he would like his son contacted to discuss SNF offers and patient stated his sister needs to be called. CSW asked for his sisters information and patient stated he does not have it right now. Patient expressed he does not feel well and asked CSW to reach out to him later.   Insurance auth currently pending. CSW will follow-up.   Expected Discharge Plan: [place] Barriers to Discharge: Continued Medical Work up  Expected Discharge Plan and Services Expected Discharge Plan: [place] arrangements for the past 2 months: Single Family Home                                       Social Determinants of Health (SDOH) Interventions    Readmission Risk Interventions No flowsheet data found.

## 2019-12-17 NOTE — Progress Notes (Signed)
Patient sister Rise Paganini called regarding patient. Patient stated that sister could be updated, contact list updated in chart. Dr. Ane Payment made aware through [facility] system that family member had questions and wanted an update  Dr. Ane Payment called back and stated that patient son was updated in detail.   Will monitor. Nicolus Ose, Bettina Gavia RN

## 2019-12-17 NOTE — Progress Notes (Signed)
ANTICOAGULATION - Follow Up Consult  Pharmacy Consult for Coumadin Indication: h/o DVT and new afib  Allergies  Allergen Reactions  . Codeine    Vital Signs: Temp: 97.9 F (36.6 C) (07/20 0700) Temp Source: Oral (07/20 0700) BP: 124/95 (07/20 0700) Pulse Rate: 64 (07/20 0700)  Labs: Recent Labs    12/15/19 0359 12/16/19 0427 12/17/19 0345  HGB 9.7* 9.6*  --   HCT 32.0* 31.7*  --   PLT 257 304  --   LABPROT 22.4* 29.7* 34.7*  INR 2.1* 2.9* 3.6*  CREATININE 2.30* 2.37* 2.37*    Estimated Creatinine Clearance: 45.9 mL/min (A) (by C-G formula based on SCr of 2.37 mg/dL (H)).  Medical History: Past Medical History:  Diagnosis Date  . Epilepsy, grand mal ([place])   . Frozen shoulder    Right Shoulder  . Lumbago   . Peripheral vascular disease ([place])   . Stroke [facility])    Assessment: 64 y.o. M presented from [facility] on 7/17 with history of DVT and new onset atrial fibrillation. Patient on Coumadin PTA with home dose of 4 mg daily. Admission INR at [facility] 2.6. Patient was started on amiodarone during this admission which may potentiate the effects of warfarin. Pharmacy consulted for Coumadin dosing.  Patient had a large increase in INR value after 4 mg dose, INR continues to trend up after 1 mg dose last night.   Amiodarone IV initiated overnight, oral dose increased to 400 mg BID. Patient remains on Phenytoin, both of which can interact with Warfarin. Will hold dose today due to large INR increase trend and known drug-drug interactions, and follow-up daily INR.   Goal of Therapy:  INR 2-3 Monitor platelets by anticoagulation protocol: Yes   Plan:  Daily INR Coumadin hold dose tonight  Fara Olden, PharmD PGY-1 Pharmacy Resident 12/17/2019 8:24 AM  Please check [email or web address] for unit-specific pharmacy phone numbers.

## 2019-12-17 NOTE — Progress Notes (Signed)
PROGRESS NOTE    Matthew Gutierrez  FBP:[id number] DOB: Mar 20, 1956 DOA: 12/14/2019 PCP: Lillard Anes, MD   Brief Narrative: Patient is a 64 year old male with history of DVT on Coumadin, obesity, lymphedema, peripheral vascular disease, epilepsy, CKD stage IIIb, hypertension who presented to the [facility] for the evaluation  of increased swelling and redness of the left lower extremity.  On presentation he was found to be hypertensive.  Chest x-ray showed prominent interstitial marking reflecting volume overload or atypical infection.  Patient was started on IV clindamycin for cellulitis of left lower extremity.  His kidney function deteriorated reaching creatinine of 4.8.  Antibiotics discontinued.  Patient was transferred to [facility] for possible nephrology consultation.  Renal function began to improve prior to transfer.His baseline creatinine is around 1.8.  Normal ultrasound of the kidneys.  He is on warfarin for anticoagulation.  He has history of COPD and uses 2 L of oxygen per minute at baseline.   He takes phenytoin for epilepsy.   He is currently not on any antibiotics for  Cellulitis which has resolved. Cardiology consulted and following.  Hospital course has been remarkable for multiple episodes of A. fib with RVR .On  amiodarone drip .  EP being consulted.    PT/OT recommended skilled nursing  facility on discharge.  Assessment & Plan:   Principal Problem:   Acute renal failure superimposed on stage 3 chronic kidney disease (HCC) Active Problems:   Essential hypertension   History of DVT (deep vein thrombosis)   Absence epileptic syndrome, not intractable, without status epilepticus (HCC)   COPD (chronic obstructive pulmonary disease) (HCC)   Chronic respiratory failure with hypoxia (HCC)   Cellulitis of leg   Atrial fibrillation with RVR (HCC)   A. fib with RVR: Multiple episodes of A. fib with RVR not responding to  AV nodal blocking agents so started on  amiodarone drip drip.  Cardiology closely following.  This morning heart rate is well controlled.  CHADS-VASc is 4 (CVA x2, DM, HTN), he was already on warfarin d/t hx of DVT . EP being consulted. He may need a pacemaker.  AKI on CKD stage IIIa: Baseline creatinine around 1.8.  Developed AKI with creatinine peak at 4.8 at [place] at which point antibiotics stopped and IV fluids were given.  Kidney function slowly improving but hanging around 2.3.  This could be secondary to hemodynamic compromise from rapid ventricular rate versus cardiorenal syndrome from volume overload .monitor BMP.  Avoid nephrotoxins.  Normal ultrasound of the kidneys.  Currently looks volume overloaded, on Lasix 60 mg twice daily.  We will hold nephrology consultation for now anticipating improvement.  Acute on chronic diastolic CHF: Preserved ejection fraction as per echo from 12/04/2019.  Coreg was stopped at [facility] due to bradycardia .  He takes Lasix at home .  Elevated BNP in the range of 200s.  COPD/chronic hypoxic respiratory failure: Uses 2 L of oxygen at baseline mainly during sleep.  Currently not in exacerbation.  Continue supplemental oxygen, as needed bronchodilators.  Hypertension: Hypertensive.  He is on hydralazine with increased dose.We have added amlodipine 10 mg.  Epilepsy: Continue phenytoin  Type 2 diabetes mellitus: Takes Metformin at home.  Continue sliding scale insulin and lantus.HbA1c of 8.3  Lower extremity lymphedema, venous insufficiency/cellulitis: He was treated with antibiotics at  [facility].  Currently off antibiotics.  Followed by  wound care and recommended Unna boots.  Continue compression stockings/dressings on lower extremities.  He has mild leukocytosis,  will continue to hold antibiotics.  Debility/deconditioning: Complains of severe generalized weakness and is unable to ambulate.  Very frustrated due to his current condition.  PT/OT recommended skilled nursing  facility on discharge.         DVT prophylaxis:Coumadin Code Status: Full Family Communication: Discussed with son on phone today Status is: Inpatient  Remains inpatient appropriate because:Hemodynamically unstable   Dispo: The patient is from: Home              Anticipated d/c is to:  SNF              Anticipated d/c date is: 2 days              Patient currently is not medically stable to d/c. Needs cardiology clearance before discharge.    Consultants: Cardiology  Procedures:None  Antimicrobials:  Anti-infectives (From admission, onward)   None      Subjective: Patient seen and examined at the bedside this morning.  Hemodynamically stable.  Sitting in the chair, complains of generalized weakness.  Objective: Vitals:   12/17/19 0300 12/17/19 0400 12/17/19 0610 12/17/19 0700  BP: 120/77 (!) 161/94 (!) 176/67 (!) 124/95  Pulse: (!) 148 (!) 115 (!) 59 64  Resp: (!) 26 (!) 42 15 19  Temp: 97.7 F (36.5 C) 98 F (36.7 C) 97.7 F (36.5 C) 97.9 F (36.6 C)  TempSrc: Oral Oral Oral Oral  SpO2: 93% 91% 94% 97%  Weight:      Height:        Intake/Output Summary (Last 24 hours) at 12/17/2019 0754 Last data filed at 12/17/2019 0700 Gross per 24 hour  Intake 865.62 ml  Output 1275 ml  Net -409.38 ml   Filed Weights   12/15/19 1527 12/16/19 0300 12/17/19 0237  Weight: (!) 142.4 kg (!) 142.3 kg (!) 141.3 kg    Examination:    General exam: Sitting in the chair, morbidly obese, generalized weakness Respiratory system: Bilateral decreased entry, no crackles or wheezes  cardiovascular system: S1 & S2 heard, currently in sinus. No JVD, murmurs, rubs, gallops or clicks. Gastrointestinal system: Abdomen is nondistended, soft and nontender. No organomegaly or masses felt. Normal bowel sounds heard. Central nervous system: Alert and oriented. No focal neurological deficits. Extremities:  no clubbing ,no cyanosis, bilateral lower extremity lymphedema Skin: No  rashes, lesions or ulcers,no icterus ,no pallor  Data Reviewed: I have personally reviewed following labs and imaging studies  CBC: Recent Labs  Lab 12/14/19 0233 12/15/19 0359 12/16/19 0427  WBC 14.9* 13.6* 14.9*  NEUTROABS 12.2* 11.1* 12.5*  HGB 9.3* 9.7* 9.6*  HCT 30.1* 32.0* 31.7*  MCV 93.5 96.1 96.1  PLT 277 257 767   Basic Metabolic Panel: Recent Labs  Lab 12/14/19 0233 12/15/19 0359 12/16/19 0427 12/17/19 0345  NA 135 136 134* 134*  K 4.3 4.6 4.7 4.6  CL 89* 91* 90* 89*  CO2 33* 30 31 31   GLUCOSE 195* 216* 226* 223*  BUN 52* 50* 47* 49*  CREATININE 2.45* 2.30* 2.37* 2.37*  CALCIUM 8.2* 8.6* 8.9 9.0  MG 1.1* 1.8  --   --    GFR: Estimated Creatinine Clearance: 45.9 mL/min (A) (by C-G formula based on SCr of 2.37 mg/dL (H)). Liver Function Tests: Recent Labs  Lab 12/14/19 0233  AST 14*  ALT 9  ALKPHOS 183*  BILITOT 0.5  PROT 7.0  ALBUMIN 2.2*   No results for input(s): LIPASE, AMYLASE in the last 168 hours. No results for input(s): AMMONIA  in the last 168 hours. Coagulation Profile: Recent Labs  Lab 12/14/19 0233 12/15/19 0359 12/16/19 0427 12/17/19 0345  INR 1.9* 2.1* 2.9* 3.6*   Cardiac Enzymes: No results for input(s): CKTOTAL, CKMB, CKMBINDEX, TROPONINI in the last 168 hours. BNP (last 3 results) No results for input(s): PROBNP in the last 8760 hours. HbA1C: No results for input(s): HGBA1C in the last 72 hours. CBG: Recent Labs  Lab 12/16/19 1150 12/16/19 1553 12/16/19 1619 12/16/19 2148 12/17/19 0613  GLUCAP 283* 248* 226* 165* 209*   Lipid Profile: No results for input(s): CHOL, HDL, LDLCALC, TRIG, CHOLHDL, LDLDIRECT in the last 72 hours. Thyroid Function Tests: No results for input(s): TSH, T4TOTAL, FREET4, T3FREE, THYROIDAB in the last 72 hours. Anemia Panel: No results for input(s): VITAMINB12, FOLATE, FERRITIN, TIBC, IRON, RETICCTPCT in the last 72 hours. Sepsis Labs: No results for input(s): PROCALCITON, LATICACIDVEN in  the last 168 hours.  Recent Results (from the past 240 hour(s))  SARS Coronavirus 2 by RT PCR (hospital order, performed in [facility] hospital lab) Nasopharyngeal Nasopharyngeal Swab     Status: None   Collection Time: 12/14/19  2:34 AM   Specimen: Nasopharyngeal Swab  Result Value Ref Range Status   SARS Coronavirus 2 NEGATIVE NEGATIVE Final    Comment: (NOTE) SARS-CoV-2 target nucleic acids are NOT DETECTED.  The SARS-CoV-2 RNA is generally detectable in upper and lower respiratory specimens during the acute phase of infection. The lowest concentration of SARS-CoV-2 viral copies this assay can detect is 250 copies / mL. A negative result does not preclude SARS-CoV-2 infection and should not be used as the sole basis for treatment or other patient management decisions.  A negative result may occur with improper specimen collection / handling, submission of specimen other than nasopharyngeal swab, presence of viral mutation(s) within the areas targeted by this assay, and inadequate number of viral copies (<250 copies / mL). A negative result must be combined with clinical observations, patient history, and epidemiological information.  Fact Sheet for Patients:   StrictlyIdeas.no  Fact Sheet for Healthcare Providers: BankingDealers.co.za  This test is not yet approved or  cleared by the [place] FDA and has been authorized for detection and/or diagnosis of SARS-CoV-2 by FDA under an Emergency Use Authorization (EUA).  This EUA will remain in effect (meaning this test can be used) for the duration of the COVID-19 declaration under Section 564(b)(1) of the Act, 21 U.S.C. section 360bbb-3(b)(1), unless the authorization is terminated or revoked sooner.  Performed at [facility], [place] [address] [place], [place] [address]          Radiology Studies: ECHOCARDIOGRAM COMPLETE  Result Date: 12/15/2019     ECHOCARDIOGRAM REPORT   Patient Name:   Matthew Gutierrez Date of Exam: 12/15/2019 Medical Rec #:  [id number]      Height:       72.0 in Accession #:    [id number]     Weight:       322.0 lb Date of Birth:  1956/01/14      BSA:          2.609 m Patient Age:    55 years       BP:           171/62 mmHg Patient Gender: M              HR:           65 bpm. Exam Location:  Inpatient Procedure: 2D Echo, Cardiac Doppler, Color  Doppler and Intracardiac            Opacification Agent Indications:    Atrial Fibrillation 427.31 / I48.91  History:        Patient has no prior history of Echocardiogram examinations.                 Previous Myocardial Infarction, COPD and Stroke,                 Arrythmias:Atrial Fibrillation; Risk Factors:Sleep Apnea and                 Former Smoker. PVD. DVT.  Sonographer:    Vickie Epley RDCS Referring Phys: [phone] TRACI R TURNER  Sonographer Comments: Patient is morbidly obese. Image acquisition challenging due to patient body habitus. IMPRESSIONS  1. Poor acoustic windows limit study Left ventricular ejection fraction, by estimation, is 60 to 65%. The left ventricle has normal function. Left ventricular endocardial border not optimally defined to evaluate regional wall motion. The left ventricular internal cavity size was mildly dilated. Left ventricular diastolic function could not be evaluated.  2. Right ventricular systolic function is grossly normal. The right ventricular size is normal.  3. Pericardial effusion is mainly posterior to LV 14 mm maximal dimenstion. No evid of filling compromise. . Moderate pericardial effusion.  4. The mitral valve is normal in structure. No evidence of mitral valve regurgitation.  5. The aortic valve is tricuspid. Aortic valve regurgitation is not visualized. Mild aortic valve sclerosis is present, with no evidence of aortic valve stenosis.  6. The inferior vena cava is dilated in size with >50% respiratory variability, suggesting right atrial pressure of 8  mmHg. FINDINGS  Left Ventricle: Poor acoustic windows limit study Cannot evaluate regional wall motion. Left ventricular ejection fraction, by estimation, is 60 to 65%. The left ventricle has normal function. Left ventricular endocardial border not optimally defined to  evaluate regional wall motion. The left ventricular internal cavity size was mildly dilated. There is no left ventricular hypertrophy. Left ventricular diastolic function could not be evaluated. Right Ventricle: The right ventricular size is normal. Right vetricular wall thickness was not assessed. Right ventricular systolic function is normal. Left Atrium: Left atrial size was normal in size. Right Atrium: Right atrial size was not well visualized. Pericardium: Pericardial effusion is mainly posterior to LV 14 mm maximal dimenstion. No evid of filling compromise. A moderately sized pericardial effusion is present. Mitral Valve: The mitral valve is normal in structure. No evidence of mitral valve regurgitation. Tricuspid Valve: The tricuspid valve is normal in structure. Tricuspid valve regurgitation is trivial. Aortic Valve: The aortic valve is tricuspid. Aortic valve regurgitation is not visualized. Mild aortic valve sclerosis is present, with no evidence of aortic valve stenosis. Pulmonic Valve: The pulmonic valve was grossly normal. Pulmonic valve regurgitation is not visualized. Aorta: The aortic root is normal in size and structure. Venous: The inferior vena cava is dilated in size with greater than 50% respiratory variability, suggesting right atrial pressure of 8 mmHg. IAS/Shunts: The interatrial septum was not assessed.  LEFT VENTRICLE PLAX 2D LVIDd:         5.20 cm LVIDs:         3.20 cm LV PW:         1.00 cm LV IVS:        1.00 cm LVOT diam:     2.40 cm LVOT Area:     4.52 cm  LEFT ATRIUM  Index LA diam:      4.40 cm 1.69 cm/m LA Vol (A4C): 21.7 ml 8.32 ml/m   AORTA Ao Root diam: 3.70 cm Ao Asc diam:  3.70 cm MITRAL VALVE MV  Area (PHT): 3.45 cm    SHUNTS MV Decel Time: 220 msec    Systemic Diam: 2.40 cm MV E velocity: 43.50 cm/s MV A velocity: 43.90 cm/s MV E/A ratio:  0.99 Dorris Carnes MD Electronically signed by Dorris Carnes MD Signature Date/Time: 12/15/2019/4:15:31 PM    Final         Scheduled Meds: . amiodarone  400 mg Oral BID   Followed by  . [START ON 12/22/2019] amiodarone  200 mg Oral BID   Followed by  . [START ON 12/27/2019] amiodarone  200 mg Oral Daily  . amLODipine  10 mg Oral Daily  . carvedilol  3.125 mg Oral BID WC  . furosemide  40 mg Intravenous BID  . hydrALAZINE  75 mg Oral Q8H  . insulin aspart  0-5 Units Subcutaneous QHS  . insulin aspart  0-9 Units Subcutaneous TID WC  . insulin glargine  8 Units Subcutaneous Daily  . phenytoin  300 mg Oral BID  . pravastatin  40 mg Oral q1800  . sodium chloride flush  3 mL Intravenous Q12H  . Warfarin - Pharmacist Dosing Inpatient   Does not apply q1600   Continuous Infusions: . sodium chloride    . amiodarone 60 mg/hr (12/17/19 0700)     LOS: 3 days    Time spent:25 mins,. More than 50% of that time was spent in counseling and/or coordination of care.      [name], MD [facility] P7/20/2021, 7:54 AM

## 2019-12-17 NOTE — Progress Notes (Signed)
Inpatient Diabetes Program Recommendations  AACE/ADA: New Consensus Statement on Inpatient Glycemic Control   Target Ranges:  Prepandial:   less than 140 mg/dL      Peak postprandial:   less than 180 mg/dL (1-2 hours)      Critically ill patients:  140 - 180 mg/dL   Results for JAMARIUS, SAHA (MRN [id number]) as of 12/17/2019 09:08  Ref. Range 12/16/2019 06:02 12/16/2019 11:50 12/16/2019 15:53 12/16/2019 16:19 12/16/2019 21:48 12/17/2019 06:13  Glucose-Capillary Latest Ref Range: 70 - 99 mg/dL 226 (H) 283 (H) 248 (H) 226 (H) 165 (H) 209 (H)   Review of Glycemic Control  Diabetes history: DM2 Outpatient Diabetes medications: Metformin 1000 mg BID Current orders for Inpatient glycemic control: Lantus 8 units daily, Novolog 0-9 units TID with meals, Novolog 0-5 units QHS  Inpatient Diabetes Program Recommendations:    Insulin-Please consider increasing Lantus to 10 units Q24H.  Insulin-Meal Coverage: Please consider ordering Novolog 2 units TID with meals for meal coverage if patient eats at least 50% of meals.  Thanks, Barnie Alderman, RN, MSN, CDE Diabetes Coordinator Inpatient Diabetes Program [phone] (Team Pager from 8am to 5pm)

## 2019-12-17 NOTE — Progress Notes (Addendum)
@  [email or web address].[place] Dr. Kalman Shan, on-call for Cardiology paged regarding pt's recurrent and now persistent A-fib RVR (120s-140s sustained).  Page returned @0043  and verbal order received to restart Amio gtt without bolus at 60mg /hr without decreasing at 6 hours post-initiation. Will administer and continue to monitor.  @0046  pt spontaneously converted back to SB/NSR without initiation of bolus. Dr. Kalman Shan paged regarding conversion. Page promptly returned and MD verbally endorsed to hold gtt unless pt re-entered Afib RVR and sustained. Will hold and continue to monitor.  Pt returned to A-fib RVR, gtt restarted @0118 . Will continue to monitor.

## 2019-12-18 DIAGNOSIS — I10 Essential (primary) hypertension: Secondary | ICD-10-CM | POA: Diagnosis not present

## 2019-12-18 DIAGNOSIS — L03119 Cellulitis of unspecified part of limb: Secondary | ICD-10-CM | POA: Diagnosis not present

## 2019-12-18 DIAGNOSIS — N179 Acute kidney failure, unspecified: Secondary | ICD-10-CM | POA: Diagnosis not present

## 2019-12-18 DIAGNOSIS — J449 Chronic obstructive pulmonary disease, unspecified: Secondary | ICD-10-CM | POA: Diagnosis not present

## 2019-12-18 DIAGNOSIS — I4891 Unspecified atrial fibrillation: Secondary | ICD-10-CM | POA: Diagnosis not present

## 2019-12-18 LAB — CBC WITH DIFFERENTIAL/PLATELET
Abs Immature Granulocytes: 0.07 10*3/uL (ref 0.00–0.07)
Basophils Absolute: 0.1 10*3/uL (ref 0.0–0.1)
Basophils Relative: 1 %
Eosinophils Absolute: 0.3 10*3/uL (ref 0.0–0.5)
Eosinophils Relative: 2 %
HCT: 32.5 % — ABNORMAL LOW (ref 39.0–52.0)
Hemoglobin: 9.8 g/dL — ABNORMAL LOW (ref 13.0–17.0)
Immature Granulocytes: 1 %
Lymphocytes Relative: 8 %
Lymphs Abs: 0.9 10*3/uL (ref 0.7–4.0)
MCH: 29.2 pg (ref 26.0–34.0)
MCHC: 30.2 g/dL (ref 30.0–36.0)
MCV: 96.7 fL (ref 80.0–100.0)
Monocytes Absolute: 1.1 10*3/uL — ABNORMAL HIGH (ref 0.1–1.0)
Monocytes Relative: 10 %
Neutro Abs: 9 10*3/uL — ABNORMAL HIGH (ref 1.7–7.7)
Neutrophils Relative %: 78 %
Platelets: 296 10*3/uL (ref 150–400)
RBC: 3.36 MIL/uL — ABNORMAL LOW (ref 4.22–5.81)
RDW: 14.7 % (ref 11.5–15.5)
WBC: 11.3 10*3/uL — ABNORMAL HIGH (ref 4.0–10.5)
nRBC: 0 % (ref 0.0–0.2)

## 2019-12-18 LAB — PROTIME-INR
INR: 4.2 (ref 0.8–1.2)
Prothrombin Time: 39.3 seconds — ABNORMAL HIGH (ref 11.4–15.2)

## 2019-12-18 LAB — GLUCOSE, CAPILLARY
Glucose-Capillary: 154 mg/dL — ABNORMAL HIGH (ref 70–99)
Glucose-Capillary: 211 mg/dL — ABNORMAL HIGH (ref 70–99)
Glucose-Capillary: 173 mg/dL — ABNORMAL HIGH (ref 70–99)

## 2019-12-18 LAB — BASIC METABOLIC PANEL
Anion gap: 12 (ref 5–15)
BUN: 53 mg/dL — ABNORMAL HIGH (ref 8–23)
CO2: 34 mmol/L — ABNORMAL HIGH (ref 22–32)
Calcium: 9.1 mg/dL (ref 8.9–10.3)
Chloride: 88 mmol/L — ABNORMAL LOW (ref 98–111)
Creatinine, Ser: 2.66 mg/dL — ABNORMAL HIGH (ref 0.61–1.24)
GFR calc Af Amer: 28 mL/min — ABNORMAL LOW (ref >60)
GFR calc non Af Amer: 24 mL/min — ABNORMAL LOW (ref >60)
Glucose, Bld: 163 mg/dL — ABNORMAL HIGH (ref 70–99)
Potassium: 4.4 mmol/L (ref 3.5–5.1)
Sodium: 134 mmol/L — ABNORMAL LOW (ref 135–145)

## 2019-12-18 MED ORDER — CLONAZEPAM 0.25 MG PO TBDP
0.2500 mg | ORAL_TABLET | Freq: Two times a day (BID) | ORAL | Status: DC | PRN
  Administered 2019-12-20 – 2019-12-22 (×5): 0.25 mg via ORAL
  Filled 2019-12-18 (×6): qty 1

## 2019-12-18 MED ORDER — OXYCODONE-ACETAMINOPHEN 5-325 MG PO TABS: 1.0000 | ORAL_TABLET | Freq: Four times a day (QID) | ORAL | Status: DC | PRN

## 2019-12-18 MED ORDER — ISOSORBIDE MONONITRATE ER 30 MG PO TB24
30.0000 mg | ORAL_TABLET | Freq: Every day | ORAL | Status: DC
  Administered 2019-12-18 – 2019-12-19 (×2): 30 mg via ORAL
  Filled 2019-12-18 (×2): qty 1

## 2019-12-18 MED ORDER — OXYCODONE-ACETAMINOPHEN 5-325 MG PO TABS: 1.0000 | ORAL_TABLET | ORAL | Status: DC | PRN

## 2019-12-18 MED ORDER — METOLAZONE 5 MG PO TABS
2.5000 mg | ORAL_TABLET | Freq: Once | ORAL | Status: AC
  Administered 2019-12-19: 2.5 mg via ORAL
  Filled 2019-12-18: qty 1

## 2019-12-18 MED ORDER — OXYCODONE-ACETAMINOPHEN 5-325 MG PO TABS
2.0000 | ORAL_TABLET | Freq: Once | ORAL | Status: AC
  Administered 2019-12-18: 1 via ORAL
  Filled 2019-12-18: qty 2

## 2019-12-18 MED ORDER — OXYCODONE-ACETAMINOPHEN 5-325 MG PO TABS: 2.0000 | ORAL_TABLET | Freq: Four times a day (QID) | ORAL | Status: DC | PRN

## 2019-12-18 MED ORDER — HYDRALAZINE HCL 50 MG PO TABS
100.0000 mg | ORAL_TABLET | Freq: Three times a day (TID) | ORAL | Status: DC
  Administered 2019-12-18 – 2019-12-27 (×28): 100 mg via ORAL
  Filled 2019-12-18 (×26): qty 2

## 2019-12-18 MED ORDER — FUROSEMIDE 10 MG/ML IJ SOLN
40.0000 mg | Freq: Two times a day (BID) | INTRAMUSCULAR | Status: DC
  Administered 2019-12-18 – 2019-12-21 (×6): 40 mg via INTRAVENOUS
  Filled 2019-12-18 (×5): qty 4

## 2019-12-18 MED ORDER — OXYCODONE-ACETAMINOPHEN 5-325 MG PO TABS: 2.0000 | ORAL_TABLET | ORAL | Status: DC | PRN

## 2019-12-18 MED ORDER — HYDROCODONE-ACETAMINOPHEN 5-325 MG PO TABS
1.0000 | ORAL_TABLET | ORAL | Status: DC | PRN
  Administered 2019-12-18 – 2019-12-19 (×3): 1 via ORAL
  Filled 2019-12-18 (×3): qty 1

## 2019-12-18 NOTE — Progress Notes (Addendum)
ANTICOAGULATION - Follow Up Consult  Pharmacy Consult for Coumadin Indication: h/o DVT and new afib  Allergies  Allergen Reactions  . Codeine    Vital Signs: Temp: 97.5 F (36.4 C) (07/21 0336) Temp Source: Oral (07/21 0336) BP: 183/64 (07/21 0336) Pulse Rate: 56 (07/21 0348)  Labs: Recent Labs    12/16/19 0427 12/17/19 0345 12/18/19 0443  HGB 9.6*  --  9.8*  HCT 31.7*  --  32.5*  PLT 304  --  296  LABPROT 29.7* 34.7* 39.3*  INR 2.9* 3.6* 4.2*  CREATININE 2.37* 2.37* 2.66*    Estimated Creatinine Clearance: 41.1 mL/min (A) (by C-G formula based on SCr of 2.66 mg/dL (H)).  Medical History: Past Medical History:  Diagnosis Date  . Epilepsy, grand mal ([place])   . Frozen shoulder    Right Shoulder  . Lumbago   . Peripheral vascular disease ([place])   . Stroke [facility])    Assessment: 64 y.o. M presented from [facility] on 7/17 with history of DVT and new onset atrial fibrillation. Patient on Coumadin PTA with home dose of 4 mg daily. Admission INR at [facility] 2.6. Patient was started on amiodarone during this admission which may potentiate the effects of warfarin. Pharmacy consulted for Coumadin dosing.   Amiodarone IV initiated overnight, oral dose increased to 400 mg BID. Patient remains on Phenytoin, both of which can interact with Warfarin. Will hold dose today due to large INR increase trend and known drug-drug interactions, and follow-up daily INR.   7/21: Most recent albumin was 2.2 which can also enhance effects of warfarin. 100% of breakfast charted. No new DDIs besides those above. INR today is 4.2, H/H low but stable. No s/sx of bleeding charted.   Goal of Therapy:  INR 2-3 Monitor platelets by anticoagulation protocol: Yes   Plan:  -Daily INR -Hold warfarin dose tonight  Mercy Riding, PharmD PGY1 Acute Care Pharmacy Resident Please refer to [facility] for unit-specific pharmacist  Agree with above. Thank you Anette Guarneri, PharmD [phone]

## 2019-12-18 NOTE — Progress Notes (Signed)
Orthopedic Tech Progress Note Patient Details:  Cortland Crehan Sep 10, 1955 [id number]  Ortho Devices Type of Ortho Device: Haematologist Ortho Device/Splint Location: BLE Ortho Device/Splint Interventions: Ordered   Post Interventions Patient Tolerated: Well Instructions Provided: Care of device   Janit Pagan 12/18/2019, 4:47 PM

## 2019-12-18 NOTE — Progress Notes (Addendum)
Progress Note  Patient Name: Matthew Gutierrez Date of Encounter: 12/18/2019  Primary Cardiologist: Jenne Campus, MD  Subjective   Recurrent Afib this AM - again into the 130's and associated with dyspnea and malaise.  This just broke - currently in sinus rhythm.  He ambulated some yesterday and felt good to get up and walk around.  Tired of being in chair, but uncomfortable in bed.  Inpatient Medications    Scheduled Meds: . amiodarone  400 mg Oral BID   Followed by  . [START ON 12/22/2019] amiodarone  200 mg Oral BID   Followed by  . [START ON 12/27/2019] amiodarone  200 mg Oral Daily  . amLODipine  10 mg Oral Daily  . furosemide  40 mg Intravenous Q6H  . hydrALAZINE  75 mg Oral Q8H  . insulin aspart  0-5 Units Subcutaneous QHS  . insulin aspart  0-9 Units Subcutaneous TID WC  . insulin aspart  2 Units Subcutaneous TID WC  . insulin glargine  10 Units Subcutaneous Daily  . phenytoin  300 mg Oral BID  . pravastatin  40 mg Oral q1800  . sodium chloride flush  3 mL Intravenous Q12H  . Warfarin - Pharmacist Dosing Inpatient   Does not apply q1600   Continuous Infusions: . sodium chloride     PRN Meds: sodium chloride, acetaminophen **OR** acetaminophen, albuterol, hydrALAZINE, HYDROcodone-acetaminophen, ondansetron **OR** ondansetron (ZOFRAN) IV, senna-docusate, sodium chloride flush   Vital Signs    Vitals:   12/17/19 1931 12/17/19 2318 12/18/19 0336 12/18/19 0348  BP: (!) 164/93 (!) 173/66 (!) 183/64   Pulse: 61 64 64 (!) 56  Resp: 18 20 18  (!) 23  Temp: 98.2 F (36.8 C) (!) 97.4 F (36.3 C) (!) 97.5 F (36.4 C)   TempSrc: Oral Oral Oral   SpO2: 99% 96% 99% 95%  Weight:    (!) 142.6 kg  Height:        Intake/Output Summary (Last 24 hours) at 12/18/2019 0835 Last data filed at 12/18/2019 3710 Gross per 24 hour  Intake 218.93 ml  Output 1275 ml  Net -1056.07 ml   Filed Weights   12/16/19 0300 12/17/19 0237 12/18/19 0348  Weight: (!) 142.3 kg (!) 141.3 kg  (!) 142.6 kg    Physical Exam   GEN: Well nourished, well developed, in no acute distress.  HEENT: Grossly normal.  Neck: Supple, no JVD, carotid bruits, or masses. Cardiac: RRR, no murmurs, rubs, or gallops. No clubbing, cyanosis, 2-3+ bilat LE edema to mid thighs.  Radials 2+ and equal bilaterally.  Respiratory:  Respirations regular and unlabored, bibasilar crackles. GI: Soft, nontender, nondistended, BS + x 4. MS: no deformity or atrophy. Skin: warm and dry, no rash. Neuro:  Strength and sensation are intact. Psych: AAOx3.  Normal affect.  Labs    Chemistry Recent Labs  Lab 12/14/19 0233 12/15/19 0359 12/16/19 0427 12/17/19 0345 12/18/19 0443  NA 135   < > 134* 134* 134*  K 4.3   < > 4.7 4.6 4.4  CL 89*   < > 90* 89* 88*  CO2 33*   < > 31 31 34*  GLUCOSE 195*   < > 226* 223* 163*  BUN 52*   < > 47* 49* 53*  CREATININE 2.45*   < > 2.37* 2.37* 2.66*  CALCIUM 8.2*   < > 8.9 9.0 9.1  PROT 7.0  --   --   --   --   ALBUMIN 2.2*  --   --   --   --  AST 14*  --   --   --   --   ALT 9  --   --   --   --   ALKPHOS 183*  --   --   --   --   BILITOT 0.5  --   --   --   --   GFRNONAA 27*   < > 28* 28* 24*  GFRAA 31*   < > 32* 32* 28*  ANIONGAP 13   < > 13 14 12    < > = values in this interval not displayed.     Hematology Recent Labs  Lab 12/15/19 0359 12/16/19 0427 12/18/19 0443  WBC 13.6* 14.9* 11.3*  RBC 3.33* 3.30* 3.36*  HGB 9.7* 9.6* 9.8*  HCT 32.0* 31.7* 32.5*  MCV 96.1 96.1 96.7  MCH 29.1 29.1 29.2  MCHC 30.3 30.3 30.2  RDW 14.7 14.6 14.7  PLT 257 304 296      BNP Recent Labs  Lab 12/15/19 0359  BNP 240.9*    Lipids  Lab Results  Component Value Date   CHOL 140 10/15/2019   HDL 34 (L) 10/15/2019   LDLCALC 60 10/15/2019   TRIG 290 (H) 10/15/2019   CHOLHDL 4.1 10/15/2019    HbA1c  Lab Results  Component Value Date   HGBA1C 8.3 (H) 12/14/2019    Radiology    ----  Telemetry    PAF beginning @ ~ 4a then persistent afib around  5:45a, into the 130's.  He has since broke - currently sinus rhythm - Personally Reviewed  Cardiac Studies   2D Echocardiogram7.18.2021  1. Poor acoustic windows limit study Left ventricular ejection fraction,  by estimation, is 60 to 65%. The left ventricle has normal function. Left  ventricular endocardial border not optimally defined to evaluate regional  wall motion. The left  ventricular internal cavity size was mildly dilated. Left ventricular  diastolic function could not be evaluated.  2. Right ventricular systolic function is grossly normal. The right  ventricular size is normal.  3. Pericardial effusion is mainly posterior to LV 14 mm maximal  dimenstion. No evid of filling compromise. . Moderate pericardial  effusion.  4. The mitral valve is normal in structure. No evidence of mitral valve  regurgitation.  5. The aortic valve is tricuspid. Aortic valve regurgitation is not  visualized. Mild aortic valve sclerosis is present, with no evidence of  aortic valve stenosis.  6. The inferior vena cava is dilated in size with >50% respiratory  variability, suggesting right atrial pressure of 8 mmHg.    Patient Profile     64 y.o.malewith a hx of HTN, HLD, DM, COPDon home O2, CKD III, CVA, DVTwho is being seen for the evaluation ofparoxysmalatrial fibrillation with RVRat the request of [name], MD.  [place]    1.  Paroxysmal atrial fibrillation: Initially admitted to [facility] with left lower extremity cellulitis and paroxysmal atrial fibrillation with RVR.  He initially converted on IV diltiazem but then required IV amiodarone with resultant bradycardia and switch to oral amiodarone at 200 mg twice daily.  He had recurrent atrial fibrillation on oral amiodarone in the early hours of July 19 and again in the early morning hours of July 20, prompting resumption of IV amiodarone.  We curb sided EP yesterday and they recommended continued IV  amiodarone loading.  He is currently on amio 400mg  po bid.  Up to this point, he has received approximately 2.4L amio load.  Given  size, he will require at least a 10g load.  If he has recurrent afib, would plan to resume IV amio and leave running until load complete.  Beta-blocker was discontinued yesterday in the setting of bradycardia while in sinus.  2.  Demand ischemia/elevated troponin: Troponin mildly elevated at outside hospital with a peak of 0.13 in the setting of rapid atrial fibrillation, acute on chronic stage III chronic kidney disease, diastolic ingestive heart failure.  Suspect demand ischemia.  Echo with normal LV function.  Ideally, will undergo ischemic evaluation given multiple risk factors however, nuclear stress testing not ideal given body habitus.  He is not a catheterization or coronary CTA candidate secondary to chronic kidney disease.  Defer ischemic work-up to the outpatient setting.  Continue statin.  Beta-blocker discontinued in the setting of bradycardia.  No aspirin in the setting of warfarin.  3.  Acute diastolic congestive heart failure: Echo on July 18 with an EF of 60-65%.  He has been receiving Lasix 40 since July 19 in the setting of significant volume overload.  This was increased to 40 mg every 6 hours yesterday.  He is only -576 mL overnight and -3.8 L for admission.  His weight really has not changed much and he is 142.6 kg this morning.  BUN, creatinine, bicarb bumping this morning.  Blood pressure has also been more elevated over the past 24 hours.  Will reduce lasix back to 40 bid and add a dose of metolazone for the AM.  4.  Essential hypertension: Blood pressure trending higher over the past 24 hours-160s to 170s.  Carvedilol discontinued yesterday in the setting of bradycardia.  He remains on amlodipine 10 mg daily and I will titrate hydralazine to 100 mg 3 times daily.  I will also add a long-acting nitrate.  He will likely require another agent.  With  bradycardia in sinus rhythm, clonidine is not ideal.  No ACE/ARB/MRA in the setting of acute on chronic stage III kidney disease.  Could consider alpha-blocker as next potential agent.  5.  Acute on chronic stage III kidney disease: BUN, creatinine, bicarbonate bumping this morning (53/2.66/34 respectively).  He remains markedly volume overloaded despite escalation of diuretic dosing. Reduce lasix to 40 IV bid.  Adding metolazone in AM.  6.  Hyperlipidemia: LDL 60 on the 18th.  Continue pravastatin.  7.  Left lower extremity cellulitis: Completed course of clindamycin at outside hospital.  Remains afebrile.  WBC trending down.  8.  COPD: On oxygen at 2 L/min at home and in hospital.  No wheezing.  9.  Obstructive sleep apnea: Previously diagnosed but did not tolerate mask.  Will need outpatient reevaluation.  Signed, Murray Hodgkins, NP  12/18/2019, 8:35 AM    For questions or updates, please contact   Please consult [email or web address] for contact info under Cardiology/STEMI.   The patient was seen, examined and discussed with Ignacia Bayley, NP and agree as above.  The patient failed transition to p.o. amiodarone second time now, he went to A. fib with RVR this morning but now transition back to sinus rhythm, will continue p.o. for now but restart RV if he goes back to atrial fibrillation.   Crea 2.3->2.66, BUN 47->53, we will decrease Lasix to 40 mg IV twice daily and start metolazone 2.5 mg daily starting tomorrow. We will increase hydralazine to 100 mg p.o. 3 times daily and add Imdur 30 mg daily.  Ena Dawley, MD 12/18/2019

## 2019-12-18 NOTE — Plan of Care (Signed)
  Problem: Education: Goal: Knowledge of General Education information will improve Description Including pain rating scale, medication(s)/side effects and non-pharmacologic comfort measures Outcome: Progressing   

## 2019-12-18 NOTE — Care Management Important Message (Signed)
Important Message  Patient Details  Name: Matthew Gutierrez MRN: [id number] Date of Birth: [birth year]   Medicare Important Message Given:  Yes     Shelda Altes 12/18/2019, 11:31 AM

## 2019-12-18 NOTE — Progress Notes (Signed)
PROGRESS NOTE    Matthew Gutierrez  ZOX:[id number] DOB: 09/20/1955 DOA: 12/14/2019 PCP: Lillard Anes, MD   Brief Narrative:   Patient is a 64 year old male with history of DVT on Coumadin, obesity, lymphedema, peripheral vascular disease, epilepsy, CKD stage IIIb, hypertension who [facility] for theevaluationof increased swelling and redness of the left lower extremity. On presentation he was found to be hypertensive. Chest x-ray showed prominent interstitial marking reflecting volume overload or atypical infection. Patient was started on IV clindamycin for cellulitis of left lower extremity. His kidney function deteriorated reaching creatinine of 4.8. Antibiotics discontinued. Patient was transferred to [facility] for possible nephrology consultation. Renal function began to improve prior to transfer.His baseline creatinine is around 1.8. Normal ultrasound of the kidneys. He is on warfarin for anticoagulation. He has history of COPD and uses 2 L of oxygen per minute at baseline. He takes phenytoin for epilepsy. He is currently not on any antibiotics for Cellulitis which has resolved. Cardiology consulted and following. Hospital course has been remarkable for multiple episodes of A. fib with RVR. On amiodarone drip. EP being consulted. PT/OT recommended skilled nursing  facility on discharge.  7/21: Anxious today. Add klonopin. Get him mobile with PT. They are recommending SNF. SCr is up today. Cards reducing lasix and adding dose of metolazone. Remains on PO amio. Appreciate cards assistance.      Assessment & Plan:   Principal Problem:   Acute renal failure superimposed on stage 3 chronic kidney disease (HCC) Active Problems:   Essential hypertension   History of DVT (deep vein thrombosis)   Absence epileptic syndrome, not intractable, without status epilepticus (HCC)   COPD (chronic obstructive pulmonary disease) (HCC)   Chronic respiratory failure with  hypoxia (HCC)   Cellulitis of leg   Atrial fibrillation with RVR (HCC)  A. fib with RVR      - Multiple episodes of A. fib with RVR not responding to AV nodal blocking agents so started on amiodarone drip drip.     - Cardiology onboard. EP consulted     - CHADS-VASc is 4 (CVA x2, DM, HTN), he was already on warfarin d/t hx of DVT .     - appreciate all consultants assistance     - continuing PO amiodarone  AKI on CKD stage IIIa      - Baseline creatinine around 1.8.       - Developed AKI with creatinine peak at 4.8 at [place] at which point antibiotics stopped and IV fluids were given.       - Question if this could be secondary to hemodynamic compromise from rapid ventricular rate versus cardiorenal syndrome from volume overload,     - Normal ultrasound of the kidneys.     - on Lasix 40 mg IV q6h     - monitor, may need nephro consult as SCr is trending up today     - lasix to be reduced to 40 mg IV BID and adding metolazone. Appreciate cards assistance  Acute on chronic diastolic CHF     - Preserved ejection fraction as per echo from 12/04/2019.       - Coreg was stopped at [facility] due to bradycardia.      - Elevated BNP in the range of 200s.     - on lasix 40mg  IV q6h     - reducing lasix to BID dosing and adding metolazone  COPD/chronic hypoxic respiratory failure     - Uses  2 L of oxygen at baseline mainly during sleep.     - Currently not in exacerbation.     - Continue supplemental oxygen, as needed bronchodilators.     - Wean O2 as able  Hypertension     - hydralazine, lasix, amlodipine     - monitor     - increasing hydralazine to 100mg  TID  Epilepsy     - Continue phenytoin  Type 2 diabetes mellitus     - home meds: metformin     - Continue sliding scale insulin and lantus.     - A1c of 8.3  Lower extremity lymphedema, venous insufficiency/cellulitis     - He was treated with antibiotics at [facility].     - Currently off antibiotics.        - Followed by  wound care and recommended Unna boots.     - Continue compression stockings/dressings on lower extremities.      - He has mild leukocytosis, will continue to hold antibiotics.     - No fevers, WBC continue to trend down; follow  Debility/deconditioning     - Complains of severe generalized weakness and is unable to ambulate.     - Very frustrated due to his current condition.     - PT/OT recommended skilled nursing facility on discharge.  DVT prophylaxis: coumadin Code Status: FULL Family Communication: Spoke with sister by phone.   Status is: Inpatient  Remains inpatient appropriate because:Inpatient level of care appropriate due to severity of illness   Dispo: The patient is from: Home              Anticipated d/c is to: SNF              Anticipated d/c date is: 3 days              Patient currently is not medically stable to d/c.  Consultants:   Cardiology  ROS:  Reports anxiety. Denies CP, N, V, ab pain. Remainder ROS is negative for all not previously mentioned.  Subjective: Reports anxiety this AM.   Objective: Vitals:   12/17/19 1931 12/17/19 2318 12/18/19 0336 12/18/19 0348  BP: (!) 164/93 (!) 173/66 (!) 183/64   Pulse: 61 64 64 (!) 56  Resp: 18 20 18  (!) 23  Temp: 98.2 F (36.8 C) (!) 97.4 F (36.3 C) (!) 97.5 F (36.4 C)   TempSrc: Oral Oral Oral   SpO2: 99% 96% 99% 95%  Weight:    (!) 142.6 kg  Height:        Intake/Output Summary (Last 24 hours) at 12/18/2019 0759 Last data filed at 12/18/2019 5361 Gross per 24 hour  Intake 218.93 ml  Output 1275 ml  Net -1056.07 ml   Filed Weights   12/16/19 0300 12/17/19 0237 12/18/19 0348  Weight: (!) 142.3 kg (!) 141.3 kg (!) 142.6 kg    Examination:  General: 64 y.o. male resting in chair in NAD Cardiovascular: RRR, +S1, S2, no m/g/r, equal pulses throughout Respiratory: CTABL, no w/r/r, normal WOB GI: BS+, NDNT, no masses noted, no organomegaly noted MSK: No e/c/c Neuro: Alert  to name, follows commands  Data Reviewed: I have personally reviewed following labs and imaging studies.  CBC: Recent Labs  Lab 12/14/19 0233 12/15/19 0359 12/16/19 0427 12/18/19 0443  WBC 14.9* 13.6* 14.9* 11.3*  NEUTROABS 12.2* 11.1* 12.5* 9.0*  HGB 9.3* 9.7* 9.6* 9.8*  HCT 30.1* 32.0* 31.7* 32.5*  MCV 93.5 96.1 96.1 96.7  PLT 277 257 304 416   Basic Metabolic Panel: Recent Labs  Lab 12/14/19 0233 12/15/19 0359 12/16/19 0427 12/17/19 0345 12/18/19 0443  NA 135 136 134* 134* 134*  K 4.3 4.6 4.7 4.6 4.4  CL 89* 91* 90* 89* 88*  CO2 33* 30 31 31  34*  GLUCOSE 195* 216* 226* 223* 163*  BUN 52* 50* 47* 49* 53*  CREATININE 2.45* 2.30* 2.37* 2.37* 2.66*  CALCIUM 8.2* 8.6* 8.9 9.0 9.1  MG 1.1* 1.8  --   --   --    GFR: Estimated Creatinine Clearance: 41.1 mL/min (A) (by C-G formula based on SCr of 2.66 mg/dL (H)). Liver Function Tests: Recent Labs  Lab 12/14/19 0233  AST 14*  ALT 9  ALKPHOS 183*  BILITOT 0.5  PROT 7.0  ALBUMIN 2.2*   No results for input(s): LIPASE, AMYLASE in the last 168 hours. No results for input(s): AMMONIA in the last 168 hours. Coagulation Profile: Recent Labs  Lab 12/14/19 0233 12/15/19 0359 12/16/19 0427 12/17/19 0345 12/18/19 0443  INR 1.9* 2.1* 2.9* 3.6* 4.2*   Cardiac Enzymes: No results for input(s): CKTOTAL, CKMB, CKMBINDEX, TROPONINI in the last 168 hours. BNP (last 3 results) No results for input(s): PROBNP in the last 8760 hours. HbA1C: No results for input(s): HGBA1C in the last 72 hours. CBG: Recent Labs  Lab 12/17/19 0613 12/17/19 1134 12/17/19 1618 12/17/19 2136 12/18/19 0607  GLUCAP 209* 210* 182* 160* 154*   Lipid Profile: No results for input(s): CHOL, HDL, LDLCALC, TRIG, CHOLHDL, LDLDIRECT in the last 72 hours. Thyroid Function Tests: No results for input(s): TSH, T4TOTAL, FREET4, T3FREE, THYROIDAB in the last 72 hours. Anemia Panel: No results for input(s): VITAMINB12, FOLATE, FERRITIN, TIBC, IRON,  RETICCTPCT in the last 72 hours. Sepsis Labs: No results for input(s): PROCALCITON, LATICACIDVEN in the last 168 hours.  Recent Results (from the past 240 hour(s))  SARS Coronavirus 2 by RT PCR (hospital order, performed in [facility] hospital lab) Nasopharyngeal Nasopharyngeal Swab     Status: None   Collection Time: 12/14/19  2:34 AM   Specimen: Nasopharyngeal Swab  Result Value Ref Range Status   SARS Coronavirus 2 NEGATIVE NEGATIVE Final    Comment: (NOTE) SARS-CoV-2 target nucleic acids are NOT DETECTED.  The SARS-CoV-2 RNA is generally detectable in upper and lower respiratory specimens during the acute phase of infection. The lowest concentration of SARS-CoV-2 viral copies this assay can detect is 250 copies / mL. A negative result does not preclude SARS-CoV-2 infection and should not be used as the sole basis for treatment or other patient management decisions.  A negative result may occur with improper specimen collection / handling, submission of specimen other than nasopharyngeal swab, presence of viral mutation(s) within the areas targeted by this assay, and inadequate number of viral copies (<250 copies / mL). A negative result must be combined with clinical observations, patient history, and epidemiological information.  Fact Sheet for Patients:   StrictlyIdeas.no  Fact Sheet for Healthcare Providers: BankingDealers.co.za  This test is not yet approved or  cleared by the [place] FDA and has been authorized for detection and/or diagnosis of SARS-CoV-2 by FDA under an Emergency Use Authorization (EUA).  This EUA will remain in effect (meaning this test can be used) for the duration of the COVID-19 declaration under Section 564(b)(1) of the Act, 21 U.S.C. section 360bbb-3(b)(1), unless the authorization is terminated or revoked sooner.  Performed at [facility], [place] [address], [place], [place] [address]  Radiology Studies: No results found.   Scheduled Meds: . amiodarone  400 mg Oral BID   Followed by  . [START ON 12/22/2019] amiodarone  200 mg Oral BID   Followed by  . [START ON 12/27/2019] amiodarone  200 mg Oral Daily  . amLODipine  10 mg Oral Daily  . furosemide  40 mg Intravenous Q6H  . hydrALAZINE  75 mg Oral Q8H  . insulin aspart  0-5 Units Subcutaneous QHS  . insulin aspart  0-9 Units Subcutaneous TID WC  . insulin aspart  2 Units Subcutaneous TID WC  . insulin glargine  10 Units Subcutaneous Daily  . phenytoin  300 mg Oral BID  . pravastatin  40 mg Oral q1800  . sodium chloride flush  3 mL Intravenous Q12H  . Warfarin - Pharmacist Dosing Inpatient   Does not apply q1600   Continuous Infusions: . sodium chloride       LOS: 4 days    Time spent: 25 minutes spent in the coordination of care today.    Jonnie Finner, DO [facility]  If 7PM-7AM, please contact night-coverage [email or web address] 12/18/2019, 7:59 AM

## 2019-12-18 NOTE — Plan of Care (Signed)
  Problem: Education: Goal: Knowledge of General Education information will improve Description: Including pain rating scale, medication(s)/side effects and non-pharmacologic comfort measures 12/18/2019 1259 by Emmaline Life, RN Outcome: Progressing 12/18/2019 1259 by Emmaline Life, RN Outcome: Progressing

## 2019-12-18 NOTE — Progress Notes (Deleted)
Progress Note  Patient Name: Matthew Gutierrez Date of Encounter: 12/18/2019  Primary Cardiologist: Jenne Campus, MD  Subjective   "This is a miserable existence."  Recurrent atrial fibrillation between the hours of midnight and 4 AM this morning with associated dyspnea.  He is very frustrated with his lack of clinical improvement.  He cannot get comfortable in the bed and ends up sitting at the bedside in a recliner all day and night.  He has not had much of an appetite.  He denies chest pain but continues to note dyspnea with minimal activity.  Inpatient Medications    Scheduled Meds: . amiodarone  400 mg Oral BID   Followed by  . [START ON 12/22/2019] amiodarone  200 mg Oral BID   Followed by  . [START ON 12/27/2019] amiodarone  200 mg Oral Daily  . amLODipine  10 mg Oral Daily  . furosemide  40 mg Intravenous Q6H  . hydrALAZINE  75 mg Oral Q8H  . insulin aspart  0-5 Units Subcutaneous QHS  . insulin aspart  0-9 Units Subcutaneous TID WC  . insulin aspart  2 Units Subcutaneous TID WC  . insulin glargine  10 Units Subcutaneous Daily  . phenytoin  300 mg Oral BID  . pravastatin  40 mg Oral q1800  . sodium chloride flush  3 mL Intravenous Q12H  . Warfarin - Pharmacist Dosing Inpatient   Does not apply q1600   Continuous Infusions: . sodium chloride     PRN Meds: sodium chloride, acetaminophen **OR** acetaminophen, albuterol, hydrALAZINE, HYDROcodone-acetaminophen, ondansetron **OR** ondansetron (ZOFRAN) IV, senna-docusate, sodium chloride flush   Vital Signs    Vitals:   12/17/19 1931 12/17/19 2318 12/18/19 0336 12/18/19 0348  BP: (!) 164/93 (!) 173/66 (!) 183/64   Pulse: 61 64 64 (!) 56  Resp: 18 20 18  (!) 23  Temp: 98.2 F (36.8 C) (!) 97.4 F (36.3 C) (!) 97.5 F (36.4 C)   TempSrc: Oral Oral Oral   SpO2: 99% 96% 99% 95%  Weight:    (!) 142.6 kg  Height:        Intake/Output Summary (Last 24 hours) at 12/18/2019 0920 Last data filed at 12/18/2019 0752 Gross  per 24 hour  Intake 218.93 ml  Output 1275 ml  Net -1056.07 ml   Filed Weights   12/16/19 0300 12/17/19 0237 12/18/19 0348  Weight: (!) 142.3 kg (!) 141.3 kg (!) 142.6 kg    Physical Exam   GEN: Obese, in no acute distress.  HEENT: Grossly normal.  Neck: Obese, difficult to gauge JVP.  No carotid bruits, or masses. Cardiac: RRR, no murmurs, rubs, or gallops. No clubbing, cyanosis.  2-3+ bilateral lower extremity edema to the mid posterior thighs.  Radials 2+ and equal bilaterally.  Respiratory:  Respirations regular and unlabored, markedly diminished breath sounds at bilateral bases crackles approximately one third of the way up. GI: Obese, semifirm and edematous, nontender, BS + x 4. MS: no deformity or atrophy. Skin: warm and dry, no rash.  Lower extremities wrapped. Neuro:  Strength and sensation are intact. Psych: AAOx3.  Normal affect.  Labs    Chemistry Recent Labs  Lab 12/14/19 0233 12/15/19 0359 12/16/19 0427 12/17/19 0345 12/18/19 0443  NA 135   < > 134* 134* 134*  K 4.3   < > 4.7 4.6 4.4  CL 89*   < > 90* 89* 88*  CO2 33*   < > 31 31 34*  GLUCOSE 195*   < > 226*  223* 163*  BUN 52*   < > 47* 49* 53*  CREATININE 2.45*   < > 2.37* 2.37* 2.66*  CALCIUM 8.2*   < > 8.9 9.0 9.1  PROT 7.0  --   --   --   --   ALBUMIN 2.2*  --   --   --   --   AST 14*  --   --   --   --   ALT 9  --   --   --   --   ALKPHOS 183*  --   --   --   --   BILITOT 0.5  --   --   --   --   GFRNONAA 27*   < > 28* 28* 24*  GFRAA 31*   < > 32* 32* 28*  ANIONGAP 13   < > 13 14 12    < > = values in this interval not displayed.     Hematology Recent Labs  Lab 12/15/19 0359 12/16/19 0427 12/18/19 0443  WBC 13.6* 14.9* 11.3*  RBC 3.33* 3.30* 3.36*  HGB 9.7* 9.6* 9.8*  HCT 32.0* 31.7* 32.5*  MCV 96.1 96.1 96.7  MCH 29.1 29.1 29.2  MCHC 30.3 30.3 30.2  RDW 14.7 14.6 14.7  PLT 257 304 296    BNP Recent Labs  Lab 12/15/19 0359  BNP 240.9*    Lipids  Lab Results  Component  Value Date   CHOL 140 10/15/2019   HDL 34 (L) 10/15/2019   LDLCALC 60 10/15/2019   TRIG 290 (H) 10/15/2019   CHOLHDL 4.1 10/15/2019    HbA1c  Lab Results  Component Value Date   HGBA1C 8.3 (H) 12/14/2019    Radiology    ---  Telemetry    Sinus rhythm/sinus brady w/ recurrent afib for the better part of 12a-4a this morning - Personally Reviewed  Cardiac Studies   2D Echocardiogram 7.18.2021  1. Poor acoustic windows limit study Left ventricular ejection fraction,  by estimation, is 60 to 65%. The left ventricle has normal function. Left  ventricular endocardial border not optimally defined to evaluate regional  wall motion. The left  ventricular internal cavity size was mildly dilated. Left ventricular  diastolic function could not be evaluated.  2. Right ventricular systolic function is grossly normal. The right  ventricular size is normal.  3. Pericardial effusion is mainly posterior to LV 14 mm maximal  dimenstion. No evid of filling compromise. . Moderate pericardial  effusion.  4. The mitral valve is normal in structure. No evidence of mitral valve  regurgitation.  5. The aortic valve is tricuspid. Aortic valve regurgitation is not  visualized. Mild aortic valve sclerosis is present, with no evidence of  aortic valve stenosis.  6. The inferior vena cava is dilated in size with >50% respiratory  variability, suggesting right atrial pressure of 8 mmHg.   Patient Profile     64 y.o.malewith a hx of HTN, HLD, DM, COPD on home O2, CKD III, CVA, DVTwho is being seen for the evaluation of paroxysmal atrial fibrillation with RVRat the request of [name], MD.  Assessment & Plan    1.  Paroxysmal atrial fibrillation with rapid ventricular response: Initially admitted to [facility] with left lower extremity cellulitis and paroxysmal A. fib with RVR.  He initially converted on IV diltiazem but then required IV amiodarone with resultant bradycardia  and switch to oral amiodarone at 200 mg twice daily.  He had recurrent atrial fibrillation in  the early morning hours of July 19 and again this morning, lasting approximately 4 hours on both occasions.  He is back on IV amiodarone.  He experiences dyspnea and generalized restlessness with recurrent A. fib, at which time rates climb into the 130s to 140s.  He is currently in sinus rhythm in the 50s to 60s.  Upon further questioning, he notes that he was previously diagnosed with sleep apnea but could not tolerate a mask and only uses oxygen via nasal cannula at night.  Suspect sleep apnea playing a role in his nocturnal A. fib.  Difficult situation as he has had recurrent A. fib each time we have tried to transition from intravenous to oral amiodarone.  Limited alternate antiarrhythmic options in the setting of heart failure, demand ischemia, and acute on chronic stage III kidney disease.  Likely a poor catheter ablation candidate given obesity and untreated sleep apnea.  May benefit from EP input - will discuss w/ MD.  2.  Demand ischemia/elevated troponin: Troponin mildly elevated outside hospital with a peak of 0.13 in the setting of rapid atrial fibrillation, acute on chronic stage III kidney disease, and diastolic CHF.  Suspect demand ischemia.  Echo with normal LV function.  Ideally, he will undergo ischemic evaluation given multiple risk factors however, nuclear stress testing not ideal given his body habitus.  He is not catheterization or coronary CTA candidate secondary to chronic kidney disease.  Defer ischemic work-up to the outpatient setting.  Continue low-dose beta-blocker and statin therapy.  No aspirin in the setting of warfarin.  3.  Acute diastolic congestive heart failure: Echo on July 18 with an EF of 60-65%.  We added Lasix 40 mg IV twice daily yesterday given significant volume overload.  Elicited only -353 mL overnight.  His weight is down 1 kg.  Question accuracy of I's and O's.  His renal  function is stable with a creatinine of 2.37 this morning-same as yesterday.  Continue IV diuresis today as he remains markedly volume overloaded.  4.  Essential hypertension: Blood pressure remains variable but better this morning at 124/59.  Continue amlodipine, carvedilol, Lasix, and hydralazine at current doses) throughout the day.  5.  Hyperlipidemia: LDL 16 May on pravastatin 40.  6.  Acute on chronic stage III kidney disease: Creatinine stable at 2.37 this morning.  Previously peaked at 4.8 at [place] dose of ACE inhibitor therapy remains on hold.  Consider nephrology consultation if response to diuretics drops her renal function worsens.  7.  Left lower extremity cellulitis: Complete course of clindamycin outside hospital.  Remains afebrile.  8.  COPD: On O2 at 2 L/min at home.  No wheezing.  9.  Obstructive sleep apnea: Says he was previously diagnosed but did not tolerate mask.  Likely playing a role in recurrent A. fib.  Will need outpatient reevaluation.  Signed, Ena Dawley, MD  12/18/2019, 9:20 AM    For questions or updates, please contact   Please consult [email or web address] for contact info under Cardiology/STEMI.   The patient was seen, examined and discussed with Ignacia Bayley, NP and agree as above.  The patient failed transition to p.o. amiodarone second time now, he went to A. fib with RVR this morning but now transition back to sinus rhythm, will continue p.o. for now but restart RV if he goes back to atrial fibrillation.   Crea 2.3->2.66, BUN 47->53, we will decrease Lasix to 40 mg IV twice daily and start metolazone 5 mg daily starting  tomorrow.  Ena Dawley, MD 12/18/2019

## 2019-12-19 DIAGNOSIS — I10 Essential (primary) hypertension: Secondary | ICD-10-CM | POA: Diagnosis not present

## 2019-12-19 DIAGNOSIS — I4891 Unspecified atrial fibrillation: Secondary | ICD-10-CM | POA: Diagnosis not present

## 2019-12-19 DIAGNOSIS — N179 Acute kidney failure, unspecified: Secondary | ICD-10-CM | POA: Diagnosis not present

## 2019-12-19 DIAGNOSIS — J449 Chronic obstructive pulmonary disease, unspecified: Secondary | ICD-10-CM | POA: Diagnosis not present

## 2019-12-19 DIAGNOSIS — L03119 Cellulitis of unspecified part of limb: Secondary | ICD-10-CM | POA: Diagnosis not present

## 2019-12-19 LAB — CBC WITH DIFFERENTIAL/PLATELET
Abs Immature Granulocytes: 0.05 10*3/uL (ref 0.00–0.07)
Basophils Absolute: 0.1 10*3/uL (ref 0.0–0.1)
Basophils Relative: 1 %
Eosinophils Absolute: 0.2 10*3/uL (ref 0.0–0.5)
Eosinophils Relative: 2 %
HCT: 32.1 % — ABNORMAL LOW (ref 39.0–52.0)
Hemoglobin: 9.7 g/dL — ABNORMAL LOW (ref 13.0–17.0)
Immature Granulocytes: 1 %
Lymphocytes Relative: 8 %
Lymphs Abs: 0.8 10*3/uL (ref 0.7–4.0)
MCH: 28.7 pg (ref 26.0–34.0)
MCHC: 30.2 g/dL (ref 30.0–36.0)
MCV: 95 fL (ref 80.0–100.0)
Monocytes Absolute: 0.9 10*3/uL (ref 0.1–1.0)
Monocytes Relative: 10 %
Neutro Abs: 7.7 10*3/uL (ref 1.7–7.7)
Neutrophils Relative %: 78 %
Platelets: 290 10*3/uL (ref 150–400)
RBC: 3.38 MIL/uL — ABNORMAL LOW (ref 4.22–5.81)
RDW: 14.7 % (ref 11.5–15.5)
WBC: 9.8 10*3/uL (ref 4.0–10.5)
nRBC: 0 % (ref 0.0–0.2)

## 2019-12-19 LAB — GLUCOSE, CAPILLARY
Glucose-Capillary: 119 mg/dL — ABNORMAL HIGH (ref 70–99)
Glucose-Capillary: 143 mg/dL — ABNORMAL HIGH (ref 70–99)
Glucose-Capillary: 162 mg/dL — ABNORMAL HIGH (ref 70–99)
Glucose-Capillary: 198 mg/dL — ABNORMAL HIGH (ref 70–99)
Glucose-Capillary: 135 mg/dL — ABNORMAL HIGH (ref 70–99)

## 2019-12-19 LAB — BASIC METABOLIC PANEL
Anion gap: 14 (ref 5–15)
BUN: 56 mg/dL — ABNORMAL HIGH (ref 8–23)
CO2: 33 mmol/L — ABNORMAL HIGH (ref 22–32)
Calcium: 8.9 mg/dL (ref 8.9–10.3)
Chloride: 88 mmol/L — ABNORMAL LOW (ref 98–111)
Creatinine, Ser: 2.78 mg/dL — ABNORMAL HIGH (ref 0.61–1.24)
GFR calc Af Amer: 27 mL/min — ABNORMAL LOW (ref >60)
GFR calc non Af Amer: 23 mL/min — ABNORMAL LOW (ref >60)
Glucose, Bld: 144 mg/dL — ABNORMAL HIGH (ref 70–99)
Potassium: 4.6 mmol/L (ref 3.5–5.1)
Sodium: 135 mmol/L (ref 135–145)

## 2019-12-19 LAB — SARS CORONAVIRUS 2 (TAT 6-24 HRS): SARS Coronavirus 2: NEGATIVE

## 2019-12-19 LAB — PROTIME-INR
INR: 4 — ABNORMAL HIGH (ref 0.8–1.2)
Prothrombin Time: 37.9 seconds — ABNORMAL HIGH (ref 11.4–15.2)

## 2019-12-19 MED ORDER — ISOSORBIDE MONONITRATE ER 60 MG PO TB24
60.0000 mg | ORAL_TABLET | Freq: Every day | ORAL | Status: DC
  Administered 2019-12-20 – 2019-12-21 (×2): 60 mg via ORAL
  Filled 2019-12-19 (×2): qty 1

## 2019-12-19 MED ORDER — OXYCODONE-ACETAMINOPHEN 7.5-325 MG PO TABS
1.0000 | ORAL_TABLET | Freq: Four times a day (QID) | ORAL | Status: DC | PRN
  Administered 2019-12-19 – 2019-12-25 (×4): 1 via ORAL
  Filled 2019-12-19 (×5): qty 1

## 2019-12-19 NOTE — Progress Notes (Addendum)
ANTICOAGULATION - Follow Up Consult  Pharmacy Consult for Coumadin Indication: h/o DVT and new afib  Allergies  Allergen Reactions  . Codeine    Vital Signs: Temp: 97.4 F (36.3 C) (07/22 0750) Temp Source: Oral (07/22 0750) BP: 174/80 (07/22 0750) Pulse Rate: 61 (07/22 0750)  Labs: Recent Labs    12/17/19 0345 12/18/19 0443 12/19/19 0313 12/19/19 0744  HGB  --  9.8*  --  9.7*  HCT  --  32.5*  --  32.1*  PLT  --  296  --  290  LABPROT 34.7* 39.3* 37.9*  --   INR 3.6* 4.2* 4.0*  --   CREATININE 2.37* 2.66* 2.78*  --     Estimated Creatinine Clearance: 37.7 mL/min (A) (by C-G formula based on SCr of 2.78 mg/dL (H)).  Medical History: Past Medical History:  Diagnosis Date  . Epilepsy, grand mal ([place])   . Frozen shoulder    Right Shoulder  . Lumbago   . Peripheral vascular disease ([place])   . Stroke [facility])    Assessment: 64 y.o. M presented from [facility] on 7/17 with history of DVT and new onset atrial fibrillation. Patient on Coumadin PTA with home dose of 4 mg daily. Admission INR at [facility] 2.6. Patient was started on amiodarone during this admission which may potentiate the effects of warfarin. Pharmacy consulted for Coumadin dosing.   Patient remains on amiodarone and phenytoin, both of which can interact with warfarin. Will hold dose today due to elevated INR trends and known drug-drug interactions. Follow-up daily INR.    7/22: INR today is 4.0. No s/sx of bleeding charted, H/H stable. PO intake is good. Anticipating INR will continue to drop to reflect held doses.  Goal of Therapy:  INR 2-3 Monitor platelets by anticoagulation protocol: Yes   Plan:  -Daily INR -Hold warfarin dose again tonight  Mercy Riding, PharmD PGY1 Acute Care Pharmacy Resident Please refer to [facility] for unit-specific pharmacist  I discussed / reviewed the pharmacy note by Dr. Sherryll Burger and I agree with the resident's findings and plans as documented.  Thank you Anette Guarneri, PharmD

## 2019-12-19 NOTE — Progress Notes (Signed)
Physical Therapy Treatment Patient Details Name: Matthew Gutierrez MRN: [id number] DOB: 1955/07/01 Today's Date: 12/19/2019    History of Present Illness Pt is a 64 y.o. male admitted 12/14/19 from [facility] with BLE cellulitis. Workup for AKI on CKD 3, acute diastolic CHF, paraoxysmal afib with RVR. Ultrasound (-) DVT. PMH includes DVT on warfarin, obesity, lymphedema, PVD, epilepsy, CKD, HTN.   PT Comments    Pt slowly progressing with mobility. Pt limited by decreased activity tolerance and increased anxiety related to SOB with all movement (see SpO2 values below). Today's session focused on standing therex with repeated sit<>stands and prolonged bouts of standing. Pt able to tolerate ~20sec of standing before needing to sit due to SOB; requires prolonged seated rest breaks to recover DOE although SpO2 stable. Continue to recommend SNF-level therapies to maximize functional mobility and independence prior to return home.  SpO2 down to 85% on RA with activity Maintained 92-96% on 3L O2 [place] with activity   Follow Up Recommendations  SNF;Supervision for mobility/OOB     Equipment Recommendations  None recommended by PT    Recommendations for Other Services       Precautions / Restrictions Precautions Precautions: Fall;Other (comment) Precaution Comments: Anxious and SOB with any mobility, watch SpO2 Restrictions Weight Bearing Restrictions: No    Mobility  Bed Mobility               General bed mobility comments: Up in chair  Transfers Overall transfer level: Needs assistance Equipment used: Rolling walker (2 wheeled) Transfers: Sit to/from Stand Sit to Stand: Min assist;Min guard         General transfer comment: Initial minA to assist trunk elevation standing from recliner; subsequent trials with min guard, heavy reliance on BUE support to push to stand. Cues to not hold breath with transfers  Ambulation/Gait             General Gait Details: Pt  declined, therfore worked on Careers adviser [place] (Stroke Patients Only)       Balance Overall balance assessment: Needs assistance Sitting-balance support: Feet supported;No upper extremity supported Sitting balance-Leahy Scale: Fair       Standing balance-Leahy Scale: Poor Standing balance comment: Reliant on UE support                            Cognition Arousal/Alertness: Awake/alert Behavior During Therapy: WFL for tasks assessed/performed;Anxious Overall Cognitive Status: No family/caregiver present to determine baseline cognitive functioning Area of Impairment: Attention;Memory;Safety/judgement;Awareness;Problem solving                   Current Attention Level: Sustained;Selective Memory: Decreased short-term memory   Safety/Judgement: Decreased awareness of deficits Awareness: Emergent Problem Solving: Requires verbal cues;Requires tactile cues General Comments: Pt appears internally distracted and anxious regarding current situation, including medical problems and SOB. Difficult to distract or redirect from this      Exercises Other Exercises Other Exercises: 5x sit<>stand, heavy reliance on UE support; prolonged (>3 min) seated rest breaks between trials    General Comments General comments (skin integrity, edema, etc.): Prolonged time discussing anxiety, SpO2 and practicing deep breathing/pursed lip breathing strategies. Discussed d/c planning including recommendation for SNF-level therapies      Pertinent Vitals/Pain Pain Assessment: Faces Faces Pain Scale: Hurts a little bit Pain Location: BLEs, abdomen, scrotum (reports swollen,  but declines trying a scrotal sling) Pain Descriptors / Indicators: Discomfort    Home Living                      Prior Function            PT Goals (current goals can now be found in the care plan section) Acute Rehab PT  Goals Patient Stated Goal: "I'm not sure if I can go home like this, but I want to go home" Progress towards PT goals: Progressing toward goals    Frequency    Min 3X/week      PT Plan Current plan remains appropriate    Co-evaluation              AM-PAC PT "6 Clicks" Mobility   Outcome Measure  Help needed turning from your back to your side while in a flat bed without using bedrails?: A Little Help needed moving from lying on your back to sitting on the side of a flat bed without using bedrails?: A Little Help needed moving to and from a bed to a chair (including a wheelchair)?: A Little Help needed standing up from a chair using your arms (e.g., wheelchair or bedside chair)?: A Little Help needed to walk in hospital room?: A Little Help needed climbing 3-5 steps with a railing? : Total 6 Click Score: 16    End of Session Equipment Utilized During Treatment: Oxygen Activity Tolerance: Treatment limited secondary to medical complications (Comment);Patient limited by fatigue Patient left: in chair;with call bell/phone within reach Nurse Communication: Mobility status PT Visit Diagnosis: Muscle weakness (generalized) (M62.81);Difficulty in walking, not elsewhere classified (R26.2)     Time: 3734-2876 PT Time Calculation (min) (ACUTE ONLY): 21 min  Charges:  $Therapeutic Exercise: 8-22 mins                     Mabeline Caras, PT, DPT Acute Rehabilitation Services  Pager [phone] Office [place] 12/19/2019, 5:23 PM

## 2019-12-19 NOTE — Progress Notes (Addendum)
Progress Note  Patient Name: Matthew Gutierrez Date of Encounter: 12/19/2019  Primary Cardiologist: Jenne Campus, MD  Subjective   Pt reports he is very uncomfortable with the chair in his room.   Inpatient Medications    Scheduled Meds: . amiodarone  400 mg Oral BID   Followed by  . [START ON 12/22/2019] amiodarone  200 mg Oral BID   Followed by  . [START ON 12/27/2019] amiodarone  200 mg Oral Daily  . amLODipine  10 mg Oral Daily  . furosemide  40 mg Intravenous BID  . hydrALAZINE  100 mg Oral Q8H  . insulin aspart  0-5 Units Subcutaneous QHS  . insulin aspart  0-9 Units Subcutaneous TID WC  . insulin aspart  2 Units Subcutaneous TID WC  . insulin glargine  10 Units Subcutaneous Daily  . isosorbide mononitrate  30 mg Oral Daily  . metolazone  2.5 mg Oral Once  . phenytoin  300 mg Oral BID  . pravastatin  40 mg Oral q1800  . sodium chloride flush  3 mL Intravenous Q12H  . Warfarin - Pharmacist Dosing Inpatient   Does not apply q1600   Continuous Infusions: . sodium chloride     PRN Meds: sodium chloride, acetaminophen **OR** acetaminophen, albuterol, clonazepam, hydrALAZINE, HYDROcodone-acetaminophen, ondansetron **OR** ondansetron (ZOFRAN) IV, senna-docusate, sodium chloride flush   Vital Signs    Vitals:   12/18/19 2319 12/19/19 0300 12/19/19 0313 12/19/19 0750  BP: (!) 168/75 (!) 165/75  (!) 174/80  Pulse: 67 69  61  Resp: 20 13  (!) 21  Temp: 98.3 F (36.8 C) 98.5 F (36.9 C)  (!) 97.4 F (36.3 C)  TempSrc: Oral Oral  Oral  SpO2: 99% 95%  95%  Weight:   132.2 kg   Height:        Intake/Output Summary (Last 24 hours) at 12/19/2019 0903 Last data filed at 12/19/2019 0334 Gross per 24 hour  Intake 123 ml  Output 750 ml  Net -627 ml   Filed Weights   12/17/19 0237 12/18/19 0348 12/19/19 0313  Weight: (!) 141.3 kg (!) 142.6 kg 132.2 kg    Physical Exam   General: Obese, NAD Skin: Warm, dry, intact  Neck: Negative for carotid bruits. No  JVD Lungs: Diminished in bilateral lower lobes. Breathing is unlabored. Cardiovascular: RRR with S1 S2. No murmurs Extremities: 3+ BLE edema with bilateral compression wraps. Radial pulses 2+ bilaterally Neuro: Alert and oriented. No focal deficits. No facial asymmetry. MAE spontaneously. Psych: Responds to questions appropriately with normal affect.    Labs    Chemistry Recent Labs  Lab 12/14/19 0233 12/15/19 0359 12/17/19 0345 12/18/19 0443 12/19/19 0313  NA 135   < > 134* 134* 135  K 4.3   < > 4.6 4.4 4.6  CL 89*   < > 89* 88* 88*  CO2 33*   < > 31 34* 33*  GLUCOSE 195*   < > 223* 163* 144*  BUN 52*   < > 49* 53* 56*  CREATININE 2.45*   < > 2.37* 2.66* 2.78*  CALCIUM 8.2*   < > 9.0 9.1 8.9  PROT 7.0  --   --   --   --   ALBUMIN 2.2*  --   --   --   --   AST 14*  --   --   --   --   ALT 9  --   --   --   --  ALKPHOS 183*  --   --   --   --   BILITOT 0.5  --   --   --   --   GFRNONAA 27*   < > 28* 24* 23*  GFRAA 31*   < > 32* 28* 27*  ANIONGAP 13   < > 14 12 14    < > = values in this interval not displayed.     Hematology Recent Labs  Lab 12/16/19 0427 12/18/19 0443 12/19/19 0744  WBC 14.9* 11.3* 9.8  RBC 3.30* 3.36* 3.38*  HGB 9.6* 9.8* 9.7*  HCT 31.7* 32.5* 32.1*  MCV 96.1 96.7 95.0  MCH 29.1 29.2 28.7  MCHC 30.3 30.2 30.2  RDW 14.6 14.7 14.7  PLT 304 296 290    Cardiac EnzymesNo results for input(s): TROPONINI in the last 168 hours. No results for input(s): TROPIPOC in the last 168 hours.   BNP Recent Labs  Lab 12/15/19 0359  BNP 240.9*     DDimer No results for input(s): DDIMER in the last 168 hours.   Radiology    No results found.  Telemetry    12/19/19 NSR with rates in the 60-70s - Personally Reviewed  ECG    No new tracing as of 12/19/19- Personally Reviewed  Cardiac Studies   2D Echocardiogram7.18.2021  1. Poor acoustic windows limit study Left ventricular ejection fraction,  by estimation, is 60 to 65%. The left ventricle  has normal function. Left  ventricular endocardial border not optimally defined to evaluate regional  wall motion. The left  ventricular internal cavity size was mildly dilated. Left ventricular  diastolic function could not be evaluated.  2. Right ventricular systolic function is grossly normal. The right  ventricular size is normal.  3. Pericardial effusion is mainly posterior to LV 14 mm maximal  dimenstion. No evid of filling compromise. . Moderate pericardial  effusion.  4. The mitral valve is normal in structure. No evidence of mitral valve  regurgitation.  5. The aortic valve is tricuspid. Aortic valve regurgitation is not  visualized. Mild aortic valve sclerosis is present, with no evidence of  aortic valve stenosis.  6. The inferior vena cava is dilated in size with >50% respiratory  variability, suggesting right atrial pressure of 8 mmHg.   Patient Profile     64 y.o. male with a hx of HTN, HLD, DM, COPDon home O2, CKD III, CVA, DVTwho is being seen for the evaluation ofparoxysmalatrial fibrillation with RVRat the request of Matthew Coss, MD.  Matthew Gutierrez    1.  Paroxysmal atrial fibrillation: -Initially admitted to [facility] with left lower extremity cellulitis and paroxysmal atrial fibrillation with RVR. He converted with IV diltiazem then required IV amiodarone with resultant bradycardia and switch to oral amiodarone 200 mg twice daily. Unfortunately, he had recurrent AF on oral therapy prompting resumption of IV amiodarone. EP curb sided 12/17/2019 with recommendations to continue amiodarone loading -Currently in NSR >>maintained on Amiodarone 200mg  BID -Anticoagulated with Coumadin   2.  Demand ischemia/elevated troponin: -Troponin level at outside hospital with a peak of 0.13 in the setting of RVR and acute on chronic CKD stage III with high suspicion for demand ischemia -Echocardiogram with normal LV function -Plan at this time is to defer  ischemic work-up in the outpatient setting given that he is not an ideal candidate for nuclear stress testing given body habitus and not a catheterization or coronary CT candidate secondary to CKD -No ASA in the setting of Coumadin  3.  Acute diastolic CHF: -Echocardiogram with LVEF of 50 to 65% -Has been receiving Lasix 40 mg in the setting of significant blood fluid volume overload>> Lasix reduced back to 40 mg twice daily with the addition of metolazone for the a.m. today -I&O, net -4.5 L -Weight, 291.4lb>>down from 314.3lb -Creatinine worse today at 2.78>>up from 2.66  4.  Hypertension: -174/80, 165/75, 168/75 -Carvedilol discontinued 12/17/2019 in the setting of bradycardia while in normal sinus rhythm -Continue amlodipine 10, hydralazine titrated to 100 mg 3 times daily with the addition of Imdur 30  5.  Acute on chronic CKD stage III: -Creatinine, 2.7 today up from 2.66 yesterday -Remains markedly fluid volume overloaded despite escalation of diuretic dosing -Plan yesterday was to reduce Lasix to 40 mg IV twice daily with the addition of metolazone in the a.m.  6.  HLD: -Last LDL 60 -Continue pravastatin  Signed, Kathyrn Drown NP-C [facility] Pager: [phone] 12/19/2019, 9:03 AM    The patient was seen, examined and discussed with Kathyrn Drown, NP  and I agree with the above.   The patient remains in SR on PO amiodarone, I would continue loading. His BP remains elevated, I would increase Imdur to 60 mg po daily. We will give 1 dose of metolazone 2.5 mg po today.  Crea 2.3->2.66->2.78, BUN 47->53->, we will continue to monitor.  Ena Dawley, MD  12/19/2019     For questions or updates, please contact   Please consult [email or web address] for contact info under Cardiology/STEMI.

## 2019-12-19 NOTE — Progress Notes (Signed)
PROGRESS NOTE    Matthew Gutierrez  ASN:[id number] DOB: 1955/06/26 DOA: 12/14/2019 PCP: Lillard Anes, MD   Brief Narrative:   Patient is a 64 year old male with history of DVT on Coumadin, obesity, lymphedema, peripheral vascular disease, epilepsy, CKD stage IIIb, hypertension who presented to the [facility] for theevaluationof increased swelling and redness of the left lower extremity. On presentation he was found to be hypertensive. Chest x-ray showed prominent interstitial marking reflecting volume overload or atypical infection. Patient was started on IV clindamycin for cellulitis of left lower extremity. His kidney function deteriorated reaching creatinine of 4.8. Antibiotics discontinued. Patient was transferred to [facility] for possible nephrology consultation. Renal function began to improve prior to transfer.His baseline creatinine is around 1.8. Normal ultrasound of the kidneys. He is on warfarin for anticoagulation. He has history of COPD and uses 2 L of oxygen per minute at baseline. He takes phenytoin for epilepsy. He is currently not on any antibiotics for Cellulitis which has resolved. Cardiology consulted and following. Hospital course has been remarkable for multiple episodes of A. fib with RVR. On amiodarone drip. EP being consulted. PT/OT recommended skilled nursing  facility on discharge.  7/22: Still on 3L [place]. Continuing diuresis. C/o back pain. Chronically on percocet. Switch him to that today. Needs to get more mobile. BP up. Cards increasing imdur. Appreciate assistance.    Assessment & Plan:   Principal Problem:   Acute renal failure superimposed on stage 3 chronic kidney disease (HCC) Active Problems:   Essential hypertension   History of DVT (deep vein thrombosis)   Absence epileptic syndrome, not intractable, without status epilepticus (HCC)   COPD (chronic obstructive pulmonary disease) (HCC)   Chronic respiratory failure with hypoxia (HCC)    Cellulitis of leg   Atrial fibrillation with RVR (HCC)  A. fib with RVR      - Multiple episodes of A. fib with RVR not responding to AV nodal blocking agents so started on amiodarone drip drip.     - Cardiology onboard. EP consulted     - CHADS-VASc is 4 (CVA x2, DM, HTN), he was already on warfarin d/t hx of DVT .     - appreciate all consultants assistance     - continuing PO amiodarone     - 7/22: HR looks good this AM, continuing PO amio; appreciate cards assistance  AKI on CKD stage IIIa      - Baseline creatinine around 1.8.       - Developed AKI with creatinine peak at 4.8 at [place] at which point antibiotics stopped and IV fluids were given.       - Question if this could be secondary to hemodynamic compromise from rapid ventricular rate versus cardiorenal syndrome from volume overload,     - Normal ultrasound of the kidneys.     - monitor, may need nephro consult as SCr is trending up today     - lasix to be reduced to 40 mg IV BID and adding metolazone. Appreciate cards assistance     - 7/22: SCr up again this morning (just slightly from yesterday); getting metolazone this AM, UOP improved (1300 cc), monitor    Acute on chronic diastolic CHF Pericardial effusion     - Echo 7/18: Left Ventricle: Poor acoustic windows limit study Cannot evaluate regional wall motion. Left ventricular ejection fraction, by estimation, is 60 to 65%. The left ventricle has normal function. Left ventricular endocardial border not optimally defined to evaluate regional wall motion.  The left ventricular internal cavity size was mildly dilated. There is no left ventricular hypertrophy. Left  ventricular diastolic function could not be evaluated.  Pericardium: Pericardial effusion is mainly posterior to LV 14 mm maximal dimenstion. No evid of filling compromise. A moderately sized pericardial effusion is present     - Coreg was stopped at [facility] due to bradycardia.      - Elevated BNP in the  range of 200s.     - on lasix to BID dosing and getting metolazone this AM     - 7/22: SCr up again this morning (just slightly from yesterday); getting metolazone this AM, UOP improved (1300 cc), monitor     - question how much this effusion is affecting our slow O2 wean, msg sent to cards  COPD/chronic hypoxic respiratory failure     - Uses 2 L of oxygen at baseline mainly during sleep.     - Currently not in exacerbation.     - Continue supplemental oxygen, as needed bronchodilators.     - Wean O2 as able; currently on 3L and satting well  Hypertension     - hydralazine, lasix, amlodipine, imdur     - BP is still slightly up; increasing imdur  Epilepsy     - Continue phenytoin  Type 2 diabetes mellitus     - home meds: metformin     - On SSI and lantus.     - A1c of 8.3     - DM diet     - 7/22: glucose acceptable today, follow  Lower extremity lymphedema, venous insufficiency/cellulitis     - He was treated with antibiotics at [facility].     - Currently off antibiotics.       - Followed by  wound care and recommended Unna boots.     - Continue compression stockings/dressings on lower extremities.      - He has mild leukocytosis, will continue to hold antibiotics.     - 7/22: No fevers ON, CBC pending, follow  Debility/deconditioning     - Complains of severe generalized weakness and is unable to ambulate.     - Very frustrated due to his current condition.     - PT/OT recommended skilled nursing facility on discharge.  Couagulopathy     - INR 4.0     - pharmacy dosing coumadin     - several drug-drug interactions at play here     - no frank bleed noted     - holding reversal agents for now  DVT prophylaxis: coumadin (on hold d/t supratherapeutic INR) Code Status: FULL Family Communication: Spoke with sister by phone   Status is: Inpatient  Remains inpatient appropriate because:Inpatient level of care appropriate due to severity of  illness   Dispo: The patient is from: Home              Anticipated d/c is to: SNF              Anticipated d/c date is: 3 days              Patient currently is not medically stable to d/c.  Consultants:   Cardiology  ROS:  Reports back pain, dyspnea. Denies CP, N, V, ab pain. Remainder ROS is negative for all not previously mentioned.  Subjective: "This chair is not helping."  Objective: Vitals:   12/18/19 2215 12/18/19 2319 12/19/19 0300 12/19/19 0313  BP: (!) 160/81 (!) 168/75 (!) 165/75  Pulse: 63 67 69   Resp: (!) 22 20 13    Temp: 98.1 F (36.7 C) 98.3 F (36.8 C) 98.5 F (36.9 C)   TempSrc: Oral Oral Oral   SpO2: 100% 99% 95%   Weight:    132.2 kg  Height:        Intake/Output Summary (Last 24 hours) at 12/19/2019 0713 Last data filed at 12/19/2019 0334 Gross per 24 hour  Intake 243 ml  Output 1350 ml  Net -1107 ml   Filed Weights   12/17/19 0237 12/18/19 0348 12/19/19 0313  Weight: (!) 141.3 kg (!) 142.6 kg 132.2 kg    Examination:  General: 64 y.o. male resting in bed in NAD Cardiovascular: RRR, +S1, S2, no m/g/r Respiratory: CTABL, no w/r/r, normal WOB GI: BS+, NDNT, no masses noted, no organomegaly noted MSK: No e/c/c Neuro: A&O x 3, no focal deficits Psyc: Appropriate interaction and affect, calm/cooperative  Data Reviewed: I have personally reviewed following labs and imaging studies.  CBC: Recent Labs  Lab 12/14/19 0233 12/15/19 0359 12/16/19 0427 12/18/19 0443  WBC 14.9* 13.6* 14.9* 11.3*  NEUTROABS 12.2* 11.1* 12.5* 9.0*  HGB 9.3* 9.7* 9.6* 9.8*  HCT 30.1* 32.0* 31.7* 32.5*  MCV 93.5 96.1 96.1 96.7  PLT 277 257 304 382   Basic Metabolic Panel: Recent Labs  Lab 12/14/19 0233 12/14/19 0233 12/15/19 0359 12/16/19 0427 12/17/19 0345 12/18/19 0443 12/19/19 0313  NA 135   < > 136 134* 134* 134* 135  K 4.3   < > 4.6 4.7 4.6 4.4 4.6  CL 89*   < > 91* 90* 89* 88* 88*  CO2 33*   < > 30 31 31  34* 33*  GLUCOSE 195*   < > 216*  226* 223* 163* 144*  BUN 52*   < > 50* 47* 49* 53* 56*  CREATININE 2.45*   < > 2.30* 2.37* 2.37* 2.66* 2.78*  CALCIUM 8.2*   < > 8.6* 8.9 9.0 9.1 8.9  MG 1.1*  --  1.8  --   --   --   --    < > = values in this interval not displayed.   GFR: Estimated Creatinine Clearance: 37.7 mL/min (A) (by C-G formula based on SCr of 2.78 mg/dL (H)). Liver Function Tests: Recent Labs  Lab 12/14/19 0233  AST 14*  ALT 9  ALKPHOS 183*  BILITOT 0.5  PROT 7.0  ALBUMIN 2.2*   No results for input(s): LIPASE, AMYLASE in the last 168 hours. No results for input(s): AMMONIA in the last 168 hours. Coagulation Profile: Recent Labs  Lab 12/15/19 0359 12/16/19 0427 12/17/19 0345 12/18/19 0443 12/19/19 0313  INR 2.1* 2.9* 3.6* 4.2* 4.0*   Cardiac Enzymes: No results for input(s): CKTOTAL, CKMB, CKMBINDEX, TROPONINI in the last 168 hours. BNP (last 3 results) No results for input(s): PROBNP in the last 8760 hours. HbA1C: No results for input(s): HGBA1C in the last 72 hours. CBG: Recent Labs  Lab 12/18/19 0607 12/18/19 1150 12/18/19 1549 12/18/19 2317 12/19/19 0614  GLUCAP 154* 211* 173* 119* 143*   Lipid Profile: No results for input(s): CHOL, HDL, LDLCALC, TRIG, CHOLHDL, LDLDIRECT in the last 72 hours. Thyroid Function Tests: No results for input(s): TSH, T4TOTAL, FREET4, T3FREE, THYROIDAB in the last 72 hours. Anemia Panel: No results for input(s): VITAMINB12, FOLATE, FERRITIN, TIBC, IRON, RETICCTPCT in the last 72 hours. Sepsis Labs: No results for input(s): PROCALCITON, LATICACIDVEN in the last 168 hours.  Recent Results (from the past 240 hour(s))  SARS Coronavirus 2 by RT PCR (hospital order, performed in [facility] hospital lab) Nasopharyngeal Nasopharyngeal Swab     Status: None   Collection Time: 12/14/19  2:34 AM   Specimen: Nasopharyngeal Swab  Result Value Ref Range Status   SARS Coronavirus 2 NEGATIVE NEGATIVE Final    Comment: (NOTE) SARS-CoV-2 target nucleic acids  are NOT DETECTED.  The SARS-CoV-2 RNA is generally detectable in upper and lower respiratory specimens during the acute phase of infection. The lowest concentration of SARS-CoV-2 viral copies this assay can detect is 250 copies / mL. A negative result does not preclude SARS-CoV-2 infection and should not be used as the sole basis for treatment or other patient management decisions.  A negative result may occur with improper specimen collection / handling, submission of specimen other than nasopharyngeal swab, presence of viral mutation(s) within the areas targeted by this assay, and inadequate number of viral copies (<250 copies / mL). A negative result must be combined with clinical observations, patient history, and epidemiological information.  Fact Sheet for Patients:   StrictlyIdeas.no  Fact Sheet for Healthcare Providers: BankingDealers.co.za  This test is not yet approved or  cleared by the [place] FDA and has been authorized for detection and/or diagnosis of SARS-CoV-2 by FDA under an Emergency Use Authorization (EUA).  This EUA will remain in effect (meaning this test can be used) for the duration of the COVID-19 declaration under Section 564(b)(1) of the Act, 21 U.S.C. section 360bbb-3(b)(1), unless the authorization is terminated or revoked sooner.  Performed at [facility], [place] [address], [place], [place] [address]       Radiology Studies: No results found.   Scheduled Meds: . amiodarone  400 mg Oral BID   Followed by  . [START ON 12/22/2019] amiodarone  200 mg Oral BID   Followed by  . [START ON 12/27/2019] amiodarone  200 mg Oral Daily  . amLODipine  10 mg Oral Daily  . furosemide  40 mg Intravenous BID  . hydrALAZINE  100 mg Oral Q8H  . insulin aspart  0-5 Units Subcutaneous QHS  . insulin aspart  0-9 Units Subcutaneous TID WC  . insulin aspart  2 Units Subcutaneous TID WC  . insulin glargine   10 Units Subcutaneous Daily  . isosorbide mononitrate  30 mg Oral Daily  . metolazone  2.5 mg Oral Once  . phenytoin  300 mg Oral BID  . pravastatin  40 mg Oral q1800  . sodium chloride flush  3 mL Intravenous Q12H  . Warfarin - Pharmacist Dosing Inpatient   Does not apply q1600   Continuous Infusions: . sodium chloride       LOS: 5 days    Time spent: 25 minutes spent in the coordination of care today.    Jonnie Finner, DO [facility]  If 7PM-7AM, please contact night-coverage [email or web address] 12/19/2019, 7:13 AM

## 2019-12-20 DIAGNOSIS — J449 Chronic obstructive pulmonary disease, unspecified: Secondary | ICD-10-CM | POA: Diagnosis not present

## 2019-12-20 DIAGNOSIS — L03119 Cellulitis of unspecified part of limb: Secondary | ICD-10-CM | POA: Diagnosis not present

## 2019-12-20 LAB — BASIC METABOLIC PANEL
Anion gap: 14 (ref 5–15)
BUN: 55 mg/dL — ABNORMAL HIGH (ref 8–23)
CO2: 33 mmol/L — ABNORMAL HIGH (ref 22–32)
Calcium: 8.9 mg/dL (ref 8.9–10.3)
Chloride: 88 mmol/L — ABNORMAL LOW (ref 98–111)
Creatinine, Ser: 2.79 mg/dL — ABNORMAL HIGH (ref 0.61–1.24)
GFR calc Af Amer: 26 mL/min — ABNORMAL LOW (ref >60)
GFR calc non Af Amer: 23 mL/min — ABNORMAL LOW (ref >60)
Glucose, Bld: 159 mg/dL — ABNORMAL HIGH (ref 70–99)
Potassium: 4.2 mmol/L (ref 3.5–5.1)
Sodium: 135 mmol/L (ref 135–145)

## 2019-12-20 LAB — CBC WITH DIFFERENTIAL/PLATELET
Abs Immature Granulocytes: 0.06 10*3/uL (ref 0.00–0.07)
Basophils Absolute: 0.1 10*3/uL (ref 0.0–0.1)
Basophils Relative: 1 %
Eosinophils Absolute: 0.3 10*3/uL (ref 0.0–0.5)
Eosinophils Relative: 2 %
HCT: 31.4 % — ABNORMAL LOW (ref 39.0–52.0)
Hemoglobin: 9.5 g/dL — ABNORMAL LOW (ref 13.0–17.0)
Immature Granulocytes: 1 %
Lymphocytes Relative: 9 %
Lymphs Abs: 0.9 10*3/uL (ref 0.7–4.0)
MCH: 28.3 pg (ref 26.0–34.0)
MCHC: 30.3 g/dL (ref 30.0–36.0)
MCV: 93.5 fL (ref 80.0–100.0)
Monocytes Absolute: 1 10*3/uL (ref 0.1–1.0)
Monocytes Relative: 10 %
Neutro Abs: 8.2 10*3/uL — ABNORMAL HIGH (ref 1.7–7.7)
Neutrophils Relative %: 77 %
Platelets: 280 10*3/uL (ref 150–400)
RBC: 3.36 MIL/uL — ABNORMAL LOW (ref 4.22–5.81)
RDW: 14.6 % (ref 11.5–15.5)
WBC: 10.5 10*3/uL (ref 4.0–10.5)
nRBC: 0 % (ref 0.0–0.2)

## 2019-12-20 LAB — GLUCOSE, CAPILLARY
Glucose-Capillary: 147 mg/dL — ABNORMAL HIGH (ref 70–99)
Glucose-Capillary: 174 mg/dL — ABNORMAL HIGH (ref 70–99)
Glucose-Capillary: 194 mg/dL — ABNORMAL HIGH (ref 70–99)

## 2019-12-20 LAB — PROTIME-INR
INR: 3.4 — ABNORMAL HIGH (ref 0.8–1.2)
Prothrombin Time: 32.9 seconds — ABNORMAL HIGH (ref 11.4–15.2)

## 2019-12-20 LAB — MAGNESIUM: Magnesium: 1.3 mg/dL — ABNORMAL LOW (ref 1.7–2.4)

## 2019-12-20 MED ORDER — METOLAZONE 5 MG PO TABS
2.5000 mg | ORAL_TABLET | Freq: Once | ORAL | Status: AC
  Administered 2019-12-20: 2.5 mg via ORAL
  Filled 2019-12-20: qty 1

## 2019-12-20 MED ORDER — MAGNESIUM SULFATE 2 GM/50ML IV SOLN
2.0000 g | Freq: Once | INTRAVENOUS | Status: AC
  Administered 2019-12-20: 2 g via INTRAVENOUS
  Filled 2019-12-20: qty 50

## 2019-12-20 NOTE — Progress Notes (Addendum)
Progress Note  Patient Name: Matthew Gutierrez Date of Encounter: 12/20/2019  Primary Cardiologist: Jenne Campus, MD  Subjective   Feels better today. States he got some sleep and this has helped. No chest pain   Inpatient Medications    Scheduled Meds: . amiodarone  400 mg Oral BID   Followed by  . [START ON 12/22/2019] amiodarone  200 mg Oral BID   Followed by  . [START ON 12/27/2019] amiodarone  200 mg Oral Daily  . amLODipine  10 mg Oral Daily  . furosemide  40 mg Intravenous BID  . hydrALAZINE  100 mg Oral Q8H  . insulin aspart  0-5 Units Subcutaneous QHS  . insulin aspart  0-9 Units Subcutaneous TID WC  . insulin aspart  2 Units Subcutaneous TID WC  . insulin glargine  10 Units Subcutaneous Daily  . isosorbide mononitrate  60 mg Oral Daily  . phenytoin  300 mg Oral BID  . pravastatin  40 mg Oral q1800  . sodium chloride flush  3 mL Intravenous Q12H  . Warfarin - Pharmacist Dosing Inpatient   Does not apply q1600   Continuous Infusions: . sodium chloride    . magnesium sulfate bolus IVPB     PRN Meds: sodium chloride, acetaminophen **OR** acetaminophen, albuterol, clonazepam, hydrALAZINE, ondansetron **OR** ondansetron (ZOFRAN) IV, oxyCODONE-acetaminophen, senna-docusate, sodium chloride flush   Vital Signs    Vitals:   12/20/19 0036 12/20/19 0340 12/20/19 0346 12/20/19 0743  BP:  (!) 158/79  (!) 135/74  Pulse:    66  Resp: 20 18  17   Temp:  97.7 F (36.5 C)  97.6 F (36.4 C)  TempSrc:  Oral  Oral  SpO2:  93%  98%  Weight:   (!) 139.3 kg   Height:        Intake/Output Summary (Last 24 hours) at 12/20/2019 0749 Last data filed at 12/20/2019 0700 Gross per 24 hour  Intake 443 ml  Output 2151 ml  Net -1708 ml   Filed Weights   12/18/19 0348 12/19/19 0313 12/20/19 0346  Weight: (!) 142.6 kg 132.2 kg (!) 139.3 kg    Physical Exam   General: Obese, NAD Neck: Negative for carotid bruits. No JVD Lungs: Diminished in bilateral upper and lower  lobes. Breathing is unlabored. Cardiovascular: RRR with S1 S2. No murmurs Abdomen: Soft, non-tender, distended. No obvious abdominal masses. Extremities: 2-3+BLE edema with compression wraps. Radial pulses 2+ bilaterally Neuro: Alert and oriented. No focal deficits. No facial asymmetry. MAE spontaneously. Psych: Responds to questions appropriately with normal affect.    Labs    Chemistry Recent Labs  Lab 12/14/19 0233 12/15/19 0359 12/18/19 0443 12/19/19 0313 12/20/19 0350  NA 135   < > 134* 135 135  K 4.3   < > 4.4 4.6 4.2  CL 89*   < > 88* 88* 88*  CO2 33*   < > 34* 33* 33*  GLUCOSE 195*   < > 163* 144* 159*  BUN 52*   < > 53* 56* 55*  CREATININE 2.45*   < > 2.66* 2.78* 2.79*  CALCIUM 8.2*   < > 9.1 8.9 8.9  PROT 7.0  --   --   --   --   ALBUMIN 2.2*  --   --   --   --   AST 14*  --   --   --   --   ALT 9  --   --   --   --  ALKPHOS 183*  --   --   --   --   BILITOT 0.5  --   --   --   --   GFRNONAA 27*   < > 24* 23* 23*  GFRAA 31*   < > 28* 27* 26*  ANIONGAP 13   < > 12 14 14    < > = values in this interval not displayed.     Hematology Recent Labs  Lab 12/18/19 0443 12/19/19 0744 12/20/19 0350  WBC 11.3* 9.8 10.5  RBC 3.36* 3.38* 3.36*  HGB 9.8* 9.7* 9.5*  HCT 32.5* 32.1* 31.4*  MCV 96.7 95.0 93.5  MCH 29.2 28.7 28.3  MCHC 30.2 30.2 30.3  RDW 14.7 14.7 14.6  PLT 296 290 280    Cardiac EnzymesNo results for input(s): TROPONINI in the last 168 hours. No results for input(s): TROPIPOC in the last 168 hours.   BNP Recent Labs  Lab 12/15/19 0359  BNP 240.9*     DDimer No results for input(s): DDIMER in the last 168 hours.   Radiology    No results found.  Telemetry    12/20/19 NSR with HR in the 80's- Personally Reviewed  ECG    No new tracing as of 12/20/19- Personally Reviewed  Cardiac Studies   2D Echocardiogram7.18.2021  1. Poor acoustic windows limit study Left ventricular ejection fraction,  by estimation, is 60 to 65%. The left  ventricle has normal function. Left  ventricular endocardial border not optimally defined to evaluate regional  wall motion. The left  ventricular internal cavity size was mildly dilated. Left ventricular  diastolic function could not be evaluated.  2. Right ventricular systolic function is grossly normal. The right  ventricular size is normal.  3. Pericardial effusion is mainly posterior to LV 14 mm maximal  dimenstion. No evid of filling compromise. . Moderate pericardial  effusion.  4. The mitral valve is normal in structure. No evidence of mitral valve  regurgitation.  5. The aortic valve is tricuspid. Aortic valve regurgitation is not  visualized. Mild aortic valve sclerosis is present, with no evidence of  aortic valve stenosis.  6. The inferior vena cava is dilated in size with >50% respiratory  variability, suggesting right atrial pressure of 8 mmHg.   Patient Profile     64 y.o. male with a hx of HTN, HLD, DM, COPDon home O2, CKD III, CVA, DVTwho is being seen for the evaluation ofparoxysmalatrial fibrillation with RVRat the request of [name], MD.  [place]    1.  Paroxysmal atrial fibrillation: -Initially admitted to [facility] with left lower extremity cellulitis and paroxysmal atrial fibrillation with RVR. He converted with IV diltiazem then required IV amiodarone with resultant bradycardia and switch to oral amiodarone 200 mg twice daily. Unfortunately, he had recurrent AF on oral therapy prompting resumption of IV amiodarone. EP curb sided 12/17/2019 with recommendations to continue amiodarone loading -Currently in NSR >>maintained on Amiodarone 200mg  BID -Anticoagulated with Coumadin   2.  Demand ischemia/elevated troponin: -Troponin level at outside hospital with a peak of 0.13 in the setting of RVR and acute on chronic CKD stage III with high suspicion for demand ischemia -Echocardiogram with normal LV function -Plan at this time is  to defer ischemic work-up in the outpatient setting given that he is not an ideal candidate for nuclear stress testing given body habitus and not a catheterization or coronary CT candidate secondary to CKD -No ASA in the setting of Coumadin  3.  Acute diastolic CHF: -Echocardiogram with LVEF of 50 to 65% -Has been receiving Lasix 40 mg in the setting of significant blood fluid volume overload>> Lasix reduced back to 40 mg twice daily with the addition of metolazone>>will add an additional dose for today  -I&O, net -6.2 L  -Weight, 307lb today -Creatinine 2.79 today>>stable   4.  Hypertension: -Improved today 135/74>158/79>176/75 -Carvedilol discontinued 12/17/2019 in the setting of bradycardia while in normal sinus rhythm -Continue amlodipine 10, hydralazine titrated to 100 mg 3 times daily -Imdur increased to 60mg  PO QD yesterday   5.  Acute on chronic CKD stage III: -Creatinine, 2.79>>similar as yesterday  -Remains markedly fluid volume overloaded despite escalation of diuretic dosing -Continue Lasix to 40 mg IV twice daily with the addition of metolazone>>would continue again today   6.  HLD: -Last LDL 60 -Continue pravastatin  Signed, Kathyrn Drown NP-C [facility] Pager: [phone] 12/20/2019, 7:49 AM    The patient was seen, examined and discussed with Kathyrn Drown, NP  and I agree with the above.   The patient feels better, he is finally diuresing, negative fluid balance 1.8 L overnight, Crea up since admission but stable Crea 2.3->2.66->2.79, BUN 47->53->55, I would continue metolazone 2.5 mg po daily. Also continue loading with amiodarone PO, he had another episode of a-fib with RVR from 4-6:30 am this morning with spontaneous cardioversion to SR. BP is improving, I would further increase imdur to 90 mg po daily.    Ena Dawley, MD 12/20/2019    For questions or updates, please contact   Please consult [email or web address] for contact info under Cardiology/STEMI.

## 2019-12-20 NOTE — Progress Notes (Signed)
PROGRESS NOTE    Matthew Gutierrez  CNO:[id number] DOB: 16-Sep-1955 DOA: 12/14/2019 PCP: Lillard Anes, MD   Brief Narrative:   Patient is a 64 year old male with history of DVT on Coumadin, obesity, lymphedema, peripheral vascular disease, epilepsy, CKD stage IIIb, hypertension who [facility] the[facility]uation[facility] increased swelling and redness of the left lower extremity. On presentation he was found to be hypertensive. Chest x-ray showed prominent interstitial marking reflecting volume overload or atypical infection. Patient was started on IV clindamycin for cellulitis of left lower extremity. His kidney function deteriorated reaching creatinine of 4.8. Antibiotics discontinued. Patient was transferred to [facility] for possible nephrology consultation. Renal function began to improve prior to transfer.His baseline creatinine is around 1.8. Normal ultrasound of the kidneys. He is on warfarin for anticoagulation. He has history of COPD and uses 2 L of oxygen per minute at baseline. He takes phenytoin for epilepsy. He is currently not on any antibiotics for Cellulitis which has resolved. Cardiology consulted and following. Hospital course has been remarkable for multiple episodes of A. fib with RVR. On amiodarone drip. EP being consulted. PT/OT recommended skilled nursing facility on discharge.  7/23: Able to wean O2 to 1L [place] during interview. Continue to wean. Scr is stable today. Getting metolazone. Appreciate cards help.   Assessment & Plan:   Principal Problem:   Acute renal failure superimposed on stage 3 chronic kidney disease (HCC) Active Problems:   Essential hypertension   History of DVT (deep vein thrombosis)   Absence epileptic syndrome, not intractable, without status epilepticus (HCC)   COPD (chronic obstructive pulmonary disease) (HCC)   Chronic respiratory failure with hypoxia (HCC)   Cellulitis of leg   Atrial fibrillation with RVR (HCC)  A.  fib with RVR  - Multiple episodes of A. fib with RVR not responding to AV nodal blocking agents so started on amiodarone drip drip. - Cardiology onboard. EP consulted - CHADS-VASc is 4 (CVA x2, DM, HTN), he was already on warfarin d/t hx of DVT . - appreciate all consultants assistance - continuing PO amiodarone     - 7/23: HR is stable, continue PO amio taper  AKI on CKD stage IIIa  - Baseline creatinine around 1.8.  - Developed AKI with creatinine peak at 4.8 at [place] at which point antibiotics stopped and IV fluids were given.  - Question if this could be secondary to hemodynamic compromise from rapid ventricular rate versus cardiorenal syndrome from volume overload, - Normal ultrasound of the kidneys. - monitor, may need nephro consult as SCr is trending up today     - 7/23: SCr stable this morning. 2.1L out yesterday; continue diuresis with lasix, metolazone  Acute on chronic diastolic CHF Pericardial effusion - Echo 7/18: Left Ventricle: Poor acoustic windows limit study Cannot evaluate regional wall motion. Left ventricular ejection fraction, by estimation, is 60 to 65%. The left ventricle has normal function. Left ventricular endocardial border not optimally defined to evaluate regional wall motion. The left ventricular internal cavity size was mildly dilated. There is no left ventricular hypertrophy. Left  ventricular diastolic function could not be evaluated.  Pericardium: Pericardial effusion is mainly posterior to LV 14 mm maximal dimenstion. No evid of filling compromise. A moderately sized pericardial effusion is present - Coreg was stopped at [facility] due to bradycardia.  - Elevated BNP in the range of 200s. - on lasix to BID dosing and getting metolazone this AM     - 7/23: SCr stable this  morning. 2.1L out yesterday; continue diuresis  COPD/chronic hypoxic respiratory failure - Uses 2 L of oxygen  at baseline mainly during sleep. - Currently not in exacerbation. - Continue supplemental oxygen, as needed bronchodilators. - Wean O2 as able; pushed down to 1L [place] during interview, continue weaning.   Hypertension - hydralazine, lasix, amlodipine, imdur - 7/23: continue as above  Epilepsy - Continue phenytoin  Type 2 diabetes mellitus - home meds: metformin - On SSI and lantus. - A1c of 8.3     - DM diet     - 7/23: Glucose is fine, follow  Lower extremity lymphedema, venous insufficiency/cellulitis - He was [facility] with antibiotics at [facility]. - Currently off antibiotics.  - Followed by wound care and recommended Unna boots. - Continue compression stockings/dressings on lower extremities.  - leukocytosis resolved; continue unna boots  Debility/deconditioning - Complains of severe generalized weakness and is unable to ambulate. - Very frustrated due to his current condition. - PT/OT recommended skilled nursing facility on discharge.  Couagulopathy     - pharmacy dosing coumadin     - several drug-drug interactions at play here     - no frank bleed noted     - INR down to 3.4; follow  DVT prophylaxis: coumadin (on hold d/t supratherapeutic INR) Code Status: FULL Family Communication: None at bedside   Status is: Inpatient  Remains inpatient appropriate because:Inpatient level of care appropriate due to severity of illness   Dispo: The patient is from: Home              Anticipated d/c is to: SNF              Anticipated d/c date is: 3 days              Patient currently is not medically stable to d/c.  Consultants:   Cardiology  ROS:  Denies CP, N, ab pain, palpitations . Remainder 10-pt ROS is negative for all not previously mentioned.  Subjective: "What else has to be done?"  Objective: Vitals:   12/19/19 2354 12/20/19 0036 12/20/19 0340 12/20/19 0346  BP: (!) 176/75   (!) 158/79   Pulse: 66     Resp: 17 20 18    Temp: 97.6 F (36.4 C)  97.7 F (36.5 C)   TempSrc: Oral  Oral   SpO2: 92%  93%   Weight:    (!) 139.3 kg  Height:        Intake/Output Summary (Last 24 hours) at 12/20/2019 0709 Last data filed at 12/20/2019 0539 Gross per 24 hour  Intake 443 ml  Output 1926 ml  Net -1483 ml   Filed Weights   12/18/19 0348 12/19/19 0313 12/20/19 0346  Weight: (!) 142.6 kg 132.2 kg (!) 139.3 kg    Examination:  General: 64 y.o. male resting in bed in NAD  Cardiovascular: RRR, +S1, S2, no m/g/r Respiratory: CTABL, no w/r/r, normal WOB on 1L  GI: BS+, NDNT, no masses noted, no organomegaly noted MSK: No e/c/c Neuro: Alert to name, follows commands Psyc: calm/cooperative   Data Reviewed: I have personally reviewed following labs and imaging studies.  CBC: Recent Labs  Lab 12/15/19 0359 12/16/19 0427 12/18/19 0443 12/19/19 0744 12/20/19 0350  WBC 13.6* 14.9* 11.3* 9.8 10.5  NEUTROABS 11.1* 12.5* 9.0* 7.7 8.2*  HGB 9.7* 9.6* 9.8* 9.7* 9.5*  HCT 32.0* 31.7* 32.5* 32.1* 31.4*  MCV 96.1 96.1 96.7 95.0 93.5  PLT 257 304 296 290 280   Basic  Metabolic Panel: Recent Labs  Lab 12/14/19 0233 12/14/19 0233 12/15/19 0359 12/15/19 0359 12/16/19 0427 12/17/19 0345 12/18/19 0443 12/19/19 0313 12/20/19 0350  NA 135   < > 136   < > 134* 134* 134* 135 135  K 4.3   < > 4.6   < > 4.7 4.6 4.4 4.6 4.2  CL 89*   < > 91*   < > 90* 89* 88* 88* 88*  CO2 33*   < > 30   < > 31 31 34* 33* 33*  GLUCOSE 195*   < > 216*   < > 226* 223* 163* 144* 159*  BUN 52*   < > 50*   < > 47* 49* 53* 56* 55*  CREATININE 2.45*   < > 2.30*   < > 2.37* 2.37* 2.66* 2.78* 2.79*  CALCIUM 8.2*   < > 8.6*   < > 8.9 9.0 9.1 8.9 8.9  MG 1.1*  --  1.8  --   --   --   --   --  1.3*   < > = values in this interval not displayed.   GFR: Estimated Creatinine Clearance: 38.2 mL/min (A) (by C-G formula based on SCr of 2.79 mg/dL (H)). Liver Function Tests: Recent Labs  Lab  12/14/19 0233  AST 14*  ALT 9  ALKPHOS 183*  BILITOT 0.5  PROT 7.0  ALBUMIN 2.2*   No results for input(s): LIPASE, AMYLASE in the last 168 hours. No results for input(s): AMMONIA in the last 168 hours. Coagulation Profile: Recent Labs  Lab 12/16/19 0427 12/17/19 0345 12/18/19 0443 12/19/19 0313 12/20/19 0350  INR 2.9* 3.6* 4.2* 4.0* 3.4*   Cardiac Enzymes: No results for input(s): CKTOTAL, CKMB, CKMBINDEX, TROPONINI in the last 168 hours. BNP (last 3 results) No results for input(s): PROBNP in the last 8760 hours. HbA1C: No results for input(s): HGBA1C in the last 72 hours. CBG: Recent Labs  Lab 12/19/19 0614 12/19/19 1116 12/19/19 1615 12/19/19 2154 12/20/19 0632  GLUCAP 143* 162* 198* 135* 147*   Lipid Profile: No results for input(s): CHOL, HDL, LDLCALC, TRIG, CHOLHDL, LDLDIRECT in the last 72 hours. Thyroid Function Tests: No results for input(s): TSH, T4TOTAL, FREET4, T3FREE, THYROIDAB in the last 72 hours. Anemia Panel: No results for input(s): VITAMINB12, FOLATE, FERRITIN, TIBC, IRON, RETICCTPCT in the last 72 hours. Sepsis Labs: No results for input(s): PROCALCITON, LATICACIDVEN in the last 168 hours.  Recent Results (from the past 240 hour(s))  SARS Coronavirus 2 by RT PCR (hospital order, performed in [facility] hospital lab) Nasopharyngeal Nasopharyngeal Swab     Status: None   Collection Time: 12/14/19  2:34 AM   Specimen: Nasopharyngeal Swab  Result Value Ref Range Status   SARS Coronavirus 2 NEGATIVE NEGATIVE Final    Comment: (NOTE) SARS-CoV-2 target nucleic acids are NOT DETECTED.  The SARS-CoV-2 RNA is generally detectable in upper and lower respiratory specimens during the acute phase of infection. The lowest concentration of SARS-CoV-2 viral copies this assay can detect is 250 copies / mL. A negative result does not preclude SARS-CoV-2 infection and should not be used as the sole basis for treatment or other patient management  decisions.  A negative result may occur with improper specimen collection / handling, submission of specimen other than nasopharyngeal swab, presence of viral mutation(s) within the areas targeted by this assay, and inadequate number of viral copies (<250 copies / mL). A negative result must be combined with clinical observations, patient history, and  epidemiological information.  Fact Sheet for Patients:   StrictlyIdeas.no  Fact Sheet for Healthcare Providers: BankingDealers.co.za  This test is not yet approved or  cleared by the [place] FDA and has been authorized for detection and/or diagnosis of SARS-CoV-2 by FDA under an Emergency Use Authorization (EUA).  This EUA will remain in effect (meaning this test can be used) for the duration of the COVID-19 declaration under Section 564(b)(1) of the Act, 21 U.S.C. section 360bbb-3(b)(1), unless the authorization is terminated or revoked sooner.  Performed at [facility], [place] [address] [place], [place] [address]   SARS CORONAVIRUS 2 (TAT 6-24 HRS) Nasopharyngeal Nasopharyngeal Swab     Status: None   Collection Time: 12/19/19  6:00 PM   Specimen: Nasopharyngeal Swab  Result Value Ref Range Status   SARS Coronavirus 2 NEGATIVE NEGATIVE Final    Comment: (NOTE) SARS-CoV-2 target nucleic acids are NOT DETECTED.  The SARS-CoV-2 RNA is generally detectable in upper and lower respiratory specimens during the acute phase of infection. Negative results do not preclude SARS-CoV-2 infection, do not rule out co-infections with other pathogens, and should not be used as the sole basis for treatment or other patient management decisions. Negative results must be combined with clinical observations, patient history, and epidemiological information. The expected result is Negative.  Fact Sheet for Patients: SugarRoll.be  Fact Sheet for Healthcare  Providers: https://www.woods-mathews.com/  This test is not yet approved or cleared by the [place] FDA and  has been authorized for detection and/or diagnosis of SARS-CoV-2 by FDA under an Emergency Use Authorization (EUA). This EUA will remain  in effect (meaning this test can be used) for the duration of the COVID-19 declaration under Se ction 564(b)(1) of the Act, 21 U.S.C. section 360bbb-3(b)(1), unless the authorization is terminated or revoked sooner.  Performed at [facility], [place] [address], [place], [place] [address]       Radiology Studies: No results found.   Scheduled Meds: . amiodarone  400 mg Oral BID   Followed by  . [START ON 12/22/2019] amiodarone  200 mg Oral BID   Followed by  . [START ON 12/27/2019] amiodarone  200 mg Oral Daily  . amLODipine  10 mg Oral Daily  . furosemide  40 mg Intravenous BID  . hydrALAZINE  100 mg Oral Q8H  . insulin aspart  0-5 Units Subcutaneous QHS  . insulin aspart  0-9 Units Subcutaneous TID WC  . insulin aspart  2 Units Subcutaneous TID WC  . insulin glargine  10 Units Subcutaneous Daily  . isosorbide mononitrate  60 mg Oral Daily  . phenytoin  300 mg Oral BID  . pravastatin  40 mg Oral q1800  . sodium chloride flush  3 mL Intravenous Q12H  . Warfarin - Pharmacist Dosing Inpatient   Does not apply q1600   Continuous Infusions: . sodium chloride    . magnesium sulfate bolus IVPB       LOS: 6 days    Time spent: 25 minutes spent in the coordination of care today.    Jonnie Finner, DO [facility]  If 7PM-7AM, please contact night-coverage [email or web address] 12/20/2019, 7:09 AM

## 2019-12-20 NOTE — TOC Progression Note (Signed)
Transition of Care [facility]) - Progression Note    Patient Details  Name: Matthew Gutierrez MRN: [id number] Date of Birth: 28-Dec-1955  Transition of Care [facility]) CM/SW [place], [place] Phone Number: 12/20/2019, 9:54 AM  Clinical Narrative:     Patient has SNF authorization - reauthorization may be needed once the patient is medically ready for discharge.   Thurmond Butts, MSW, [place] Clinical Social Worker   Expected Discharge Plan: Skilled Nursing Facility Barriers to Discharge: Continued Medical Work up  Expected Discharge Plan and Services Expected Discharge Plan: [place] arrangements for the past 2 months: Single Family Home                                       Social Determinants of Health (SDOH) Interventions    Readmission Risk Interventions No flowsheet data found.

## 2019-12-20 NOTE — Progress Notes (Signed)
ANTICOAGULATION - Follow Up Consult  Pharmacy Consult for Coumadin Indication: h/o DVT and new afib  Allergies  Allergen Reactions  . Codeine    Vital Signs: Temp: 97.6 F (36.4 C) (07/23 0743) Temp Source: Oral (07/23 0743) BP: 135/74 (07/23 0743) Pulse Rate: 66 (07/23 0743)  Labs: Recent Labs    12/18/19 0443 12/18/19 0443 12/19/19 0313 12/19/19 0744 12/20/19 0350  HGB 9.8*   < >  --  9.7* 9.5*  HCT 32.5*  --   --  32.1* 31.4*  PLT 296  --   --  290 280  LABPROT 39.3*  --  37.9*  --  32.9*  INR 4.2*  --  4.0*  --  3.4*  CREATININE 2.66*  --  2.78*  --  2.79*   < > = values in this interval not displayed.    Estimated Creatinine Clearance: 38.2 mL/min (A) (by C-G formula based on SCr of 2.79 mg/dL (H)).  Medical History: Past Medical History:  Diagnosis Date  . Epilepsy, grand mal ([place])   . Frozen shoulder    Right Shoulder  . Lumbago   . Peripheral vascular disease ([place])   . Stroke [facility])    Assessment: 64 y.o. M presented from [facility] on 7/17 with history of DVT and new onset atrial fibrillation. Patient on Coumadin PTA with home dose of 4 mg daily. Admission [facility]R at [facility] 2.6. Patient was started on amiodarone during this admission which may potentiate the effects of warfarin.   INR today still elevated, but trending down at 3.4   Goal of Therapy:  INR 2-3 Monitor platelets by anticoagulation protocol: Yes   Plan:  -Daily INR -Hold warfarin dose again tonight -Likely restart 7/24  Thank you Anette Guarneri, PharmD [phone]

## 2019-12-21 ENCOUNTER — Inpatient Hospital Stay (HOSPITAL_COMMUNITY): Payer: Medicare HMO

## 2019-12-21 DIAGNOSIS — J449 Chronic obstructive pulmonary disease, unspecified: Secondary | ICD-10-CM | POA: Diagnosis not present

## 2019-12-21 DIAGNOSIS — L03119 Cellulitis of unspecified part of limb: Secondary | ICD-10-CM | POA: Diagnosis not present

## 2019-12-21 LAB — PROTIME-INR
INR: 3.1 — ABNORMAL HIGH (ref 0.8–1.2)
Prothrombin Time: 31.2 seconds — ABNORMAL HIGH (ref 11.4–15.2)

## 2019-12-21 LAB — CBC WITH DIFFERENTIAL/PLATELET
Abs Immature Granulocytes: 0.09 10*3/uL — ABNORMAL HIGH (ref 0.00–0.07)
Basophils Absolute: 0.1 10*3/uL (ref 0.0–0.1)
Basophils Relative: 0 %
Eosinophils Absolute: 0.2 10*3/uL (ref 0.0–0.5)
Eosinophils Relative: 1 %
HCT: 30.6 % — ABNORMAL LOW (ref 39.0–52.0)
Hemoglobin: 9.3 g/dL — ABNORMAL LOW (ref 13.0–17.0)
Immature Granulocytes: 1 %
Lymphocytes Relative: 8 %
Lymphs Abs: 0.9 10*3/uL (ref 0.7–4.0)
MCH: 28.3 pg (ref 26.0–34.0)
MCHC: 30.4 g/dL (ref 30.0–36.0)
MCV: 93 fL (ref 80.0–100.0)
Monocytes Absolute: 0.9 10*3/uL (ref 0.1–1.0)
Monocytes Relative: 8 %
Neutro Abs: 9 10*3/uL — ABNORMAL HIGH (ref 1.7–7.7)
Neutrophils Relative %: 82 %
Platelets: 290 10*3/uL (ref 150–400)
RBC: 3.29 MIL/uL — ABNORMAL LOW (ref 4.22–5.81)
RDW: 14.6 % (ref 11.5–15.5)
WBC: 11.2 10*3/uL — ABNORMAL HIGH (ref 4.0–10.5)
nRBC: 0 % (ref 0.0–0.2)

## 2019-12-21 LAB — GLUCOSE, CAPILLARY
Glucose-Capillary: 149 mg/dL — ABNORMAL HIGH (ref 70–99)
Glucose-Capillary: 178 mg/dL — ABNORMAL HIGH (ref 70–99)
Glucose-Capillary: 172 mg/dL — ABNORMAL HIGH (ref 70–99)
Glucose-Capillary: 161 mg/dL — ABNORMAL HIGH (ref 70–99)

## 2019-12-21 LAB — BASIC METABOLIC PANEL
Anion gap: 14 (ref 5–15)
BUN: 55 mg/dL — ABNORMAL HIGH (ref 8–23)
CO2: 34 mmol/L — ABNORMAL HIGH (ref 22–32)
Calcium: 8.8 mg/dL — ABNORMAL LOW (ref 8.9–10.3)
Chloride: 86 mmol/L — ABNORMAL LOW (ref 98–111)
Creatinine, Ser: 2.9 mg/dL — ABNORMAL HIGH (ref 0.61–1.24)
GFR calc Af Amer: 25 mL/min — ABNORMAL LOW (ref >60)
GFR calc non Af Amer: 22 mL/min — ABNORMAL LOW (ref >60)
Glucose, Bld: 167 mg/dL — ABNORMAL HIGH (ref 70–99)
Potassium: 3.9 mmol/L (ref 3.5–5.1)
Sodium: 134 mmol/L — ABNORMAL LOW (ref 135–145)

## 2019-12-21 LAB — MAGNESIUM: Magnesium: 1.5 mg/dL — ABNORMAL LOW (ref 1.7–2.4)

## 2019-12-21 MED ORDER — ISOSORBIDE MONONITRATE ER 60 MG PO TB24
120.0000 mg | ORAL_TABLET | Freq: Every day | ORAL | Status: DC
  Administered 2019-12-22 – 2019-12-27 (×6): 120 mg via ORAL
  Filled 2019-12-21 (×6): qty 2

## 2019-12-21 MED ORDER — ISOSORBIDE MONONITRATE ER 60 MG PO TB24
60.0000 mg | ORAL_TABLET | Freq: Once | ORAL | Status: AC
  Administered 2019-12-21: 60 mg via ORAL
  Filled 2019-12-21: qty 1

## 2019-12-21 MED ORDER — WARFARIN 0.5 MG HALF TABLET
0.5000 mg | ORAL_TABLET | Freq: Once | ORAL | Status: AC
  Administered 2019-12-21: 0.5 mg via ORAL
  Filled 2019-12-21: qty 1

## 2019-12-21 NOTE — Consult Note (Signed)
[facility] Admit Date: 12/14/2019 12/21/2019 [name] Requesting Physician:  Cherylann Ratel  Reason for Consult:  aki on ckd HPI:  64 year old male with a past medical history of DVT on Coumadin, obesity, lymphedema, peripheral vascular disease, chronic venous stasis, epilepsy, CKD stage III, hypertension who presented to [facility] due to increased swelling and redness of his left lower extremity and he was found to be hypertensive.  His x-ray of his chest showed prominent interstitial markings reflect volume overload versus infection.  He was started on clindamycin for concerns for cellulitis of his left lower extremity.  His kidney function deterioration creatinine 4.8.  Antibiotics were then discontinued and he was transferred here for possible nephrology consultation however his renal function began to improve prior to his transfer. Cardiology was consulted for fluid overload and because he had multiple episodes of A. fib with RVR requiring amiodarone drip.  PMH Incudes: Past Medical History:  Diagnosis Date  . Epilepsy, grand mal ([place])   . Frozen shoulder    Right Shoulder  . Lumbago   . Peripheral vascular disease ([place])   . Stroke [facility])        Creatinine, Ser (mg/dL)  Date Value  12/21/2019 2.90 (H)  12/20/2019 2.79 (H)  12/19/2019 2.78 (H)  12/18/2019 2.66 (H)  12/17/2019 2.37 (H)  12/16/2019 2.37 (H)  12/15/2019 2.30 (H)  12/14/2019 2.45 (H)  10/15/2019 1.49 (H)  ] I/Os:  ROS Balance of 12 systems is negative w/ exceptions as above  PMH  Past Medical History:  Diagnosis Date  . Epilepsy, grand mal ([place])   . Frozen shoulder    Right Shoulder  . Lumbago   . Peripheral vascular disease ([place])   . Stroke [facility])    [place]  Past Surgical History:  Procedure Laterality Date  . CORONARY STENT PLACEMENT     FH  Family History  Problem Relation Age of Onset  . Cancer Mother   . Stroke Father   . Hypertension Other   . Stroke Other   . Hyperlipidemia  Other   . Alcohol abuse Other   . Heart disease Other   . Cancer Other    SH  reports that he quit smoking about 10 years ago. He has never used smokeless tobacco. He reports current alcohol use. He reports that he does not use drugs. Allergies  Allergies  Allergen Reactions  . Codeine    Home medications Prior to Admission medications   Medication Sig Start Date End Date Taking? Authorizing Provider  allopurinol (ZYLOPRIM) 300 MG tablet Take 1 tablet (300 mg total) by mouth daily. 11/18/19  Yes Lillard Anes, MD  atenolol-chlorthalidone (TENORETIC) 100-25 MG tablet Take 1 tablet by mouth daily. 11/18/19  Yes Lillard Anes, MD  fesoterodine (TOVIAZ) 4 MG TB24 tablet Take 4 mg by mouth daily.   Yes [provider]  furosemide (LASIX) 20 MG tablet Take 20 mg by mouth daily.   Yes [provider]  gabapentin (NEURONTIN) 300 MG capsule Take 1 capsule (300 mg total) by mouth at bedtime. 11/18/19  Yes Lillard Anes, MD  hydrALAZINE (APRESOLINE) 10 MG tablet Take 2 tablets (20 mg total) by mouth 2 (two) times daily with a meal. 08/26/19  Yes Lillard Anes, MD  lisinopril (ZESTRIL) 10 MG tablet Take 1 tablet (10 mg total) by mouth daily. 11/18/19  Yes Lillard Anes, MD  metFORMIN (GLUCOPHAGE) 1000 MG tablet Take 1 tablet (1,000 mg total) by mouth 2 (two) times daily  with a meal. 11/18/19  Yes Lillard Anes, MD  pravastatin (PRAVACHOL) 40 MG tablet Take 1 tablet (40 mg total) by mouth daily. 11/18/19  Yes Lillard Anes, MD  triamcinolone cream (KENALOG) 0.1 % APPLY TO THE AFFECTED AREA(S) TWICE DAILY AS NEEDED Patient taking differently: Apply 1 application topically 2 (two) times daily as needed (irritation).  11/16/19  Yes Lillard Anes, MD  warfarin (COUMADIN) 4 MG tablet Take 1 tablet (4 mg total) by mouth daily. Patient taking differently: Take 4 mg by mouth daily at 4 PM.  07/25/19  Yes Lillard Anes, MD   fluticasone [facility]) 50 MCG/ACT nasal spray Place 1 spray into both nostrils daily. 10/30/19   Lillard Anes, MD  oxyCODONE-acetaminophen (PERCOCET) 10-325 MG tablet Take 1 tablet by mouth every 6 (six) hours as needed for pain. Patient not taking: Reported on 12/14/2019 10/22/19   Lillard Anes, MD  warfarin (COUMADIN) 3 MG tablet Take 1 tablet (3 mg total) by mouth daily. Patient not taking: Reported on 12/14/2019 07/24/19   Lillard Anes, MD    Current Medications Scheduled Meds: . amiodarone  400 mg Oral BID   Followed by  . [START ON 12/22/2019] amiodarone  200 mg Oral BID   Followed by  . [START ON 12/27/2019] amiodarone  200 mg Oral Daily  . amLODipine  10 mg Oral Daily  . hydrALAZINE  100 mg Oral Q8H  . insulin aspart  0-5 Units Subcutaneous QHS  . insulin aspart  0-9 Units Subcutaneous TID WC  . insulin aspart  2 Units Subcutaneous TID WC  . insulin glargine  10 Units Subcutaneous Daily  . [START ON 12/22/2019] isosorbide mononitrate  120 mg Oral Daily  . phenytoin  300 mg Oral BID  . pravastatin  40 mg Oral q1800  . sodium chloride flush  3 mL Intravenous Q12H  . Warfarin - Pharmacist Dosing Inpatient   Does not apply q1600   Continuous Infusions: . sodium chloride     PRN Meds:.sodium chloride, acetaminophen **OR** acetaminophen, albuterol, clonazepam, hydrALAZINE, ondansetron **OR** ondansetron (ZOFRAN) IV, oxyCODONE-acetaminophen, senna-docusate, sodium chloride flush  CBC Recent Labs  Lab 12/19/19 0744 12/20/19 0350 12/21/19 0345  WBC 9.8 10.5 11.2*  NEUTROABS 7.7 8.2* 9.0*  HGB 9.7* 9.5* 9.3*  HCT 32.1* 31.4* 30.6*  MCV 95.0 93.5 93.0  PLT 290 280 540   Basic Metabolic Panel Recent Labs  Lab 12/15/19 0359 12/16/19 0427 12/17/19 0345 12/18/19 0443 12/19/19 0313 12/20/19 0350 12/21/19 0345  NA 136 134* 134* 134* 135 135 134*  K 4.6 4.7 4.6 4.4 4.6 4.2 3.9  CL 91* 90* 89* 88* 88* 88* 86*  CO2 30 31 31  34* 33* 33* 34*  GLUCOSE  216* 226* 223* 163* 144* 159* 167*  BUN 50* 47* 49* 53* 56* 55* 55*  CREATININE 2.30* 2.37* 2.37* 2.66* 2.78* 2.79* 2.90*  CALCIUM 8.6* 8.9 9.0 9.1 8.9 8.9 8.8*    Physical Exam  Blood pressure (!) 139/70, pulse 62, temperature (!) 97.4 F (36.3 C), temperature source Oral, resp. rate 18, height 6' (1.829 m), weight (!) 138.3 kg, SpO2 90 %. GEN: wdwn, sitting up in chair, nad ENT: no nasal discharge, mmm EYES: no scleral icterus, eomi CV: S1-S2, RRR, no M/R/G PULM: Clear to auscultation bilaterally, no wheezes/rhonchi/rales/crackles, normal tactile vocal fremitus, unlabored, bilateral chest expansion ABD: Soft, nontender/nondistended SKIN: chronic stasis changes bilateral LE's EXT: firm edema, currently wrapped Neuro: AAOx3, speech clear and coherent, moves all extremities spontaneously, no  focal deficits   Assessment 1. AKI on CKD 3 (baseline Cr ~1.5-2). On presentation his rise in creatinine was likely on the basis of cardiorenal syndrome +/- infection, now his AKI likely related to diuresis/prerenal injury as evident by contraction alkalosis 2.  Contraction alkalosis, see above. Hold diuretics 3.  Acute diastolic congestive heart failure 4.  Paroxysmal A. fib 5.  Hypoalbuminemia 6.  Hypertension 7.  Diabetes mellitus 8.  COPD  Plan 1. Would recommend spot diuresis regimen, agree with holding diuretics for now especially as his lungs are overall clear on exam, the swelling in his legs seems more of a chronic process (I.e. h/o lymphedema and chronic venous stasis) so I don't believe we'll be able to offload much fluid from a diuretic standpoint.  In the long-term may benefit from torsemide p.o. 2. Monitor lytes and replete prn in setting of diuresis 3. Check renal ultrasound 4. Daily weights 5. Avoid nephrotoxic medications including NSAIDs and iodinated intravenous contrast exposure unless the latter is absolutely indicated.  Preferred narcotic agents for pain control are  hydromorphone, fentanyl, and methadone. Morphine should not be used. Avoid Baclofen and avoid oral sodium phosphate and magnesium citrate based laxatives / bowel preps. Continue strict Input and Output monitoring. Will monitor the patient closely with you and intervene or adjust therapy as indicated by changes in clinical status/labs   [name], MD [place] 12/21/2019, 2:57 PM

## 2019-12-21 NOTE — Progress Notes (Signed)
PROGRESS NOTE    Matthew Gutierrez  YWV:[id number] DOB: July 30, 1955 DOA: 12/14/2019 PCP: Lillard Anes, MD   Brief Narrative:   Patient is a 64 year old male with history of DVT on Coumadin, obesity, lymphedema, peripheral vascular disease, epilepsy, CKD stage IIIb, hypertension who presented to the [facility] for theevaluationof increased swelling and redness of the left lower extremity. On presentation he was found to be hypertensive. Chest x-ray showed prominent interstitial marking reflecting volume overload or atypical infection. Patient was started on IV clindamycin for cellulitis of left lower extremity. His kidney function deteriorated reaching creatinine of 4.8. Antibiotics discontinued. Patient was transferred to [facility] for possible nephrology consultation. Renal function began to improve prior to transfer.His baseline creatinine is around 1.8. Normal ultrasound of the kidneys. He is on warfarin for anticoagulation. He has history of COPD and uses 2 L of oxygen per minute at baseline. He takes phenytoin for epilepsy. He is currently not on any antibiotics for Cellulitis which has resolved. Cardiology consulted and following. Hospital course has been remarkable for multiple episodes of A. fib with RVR. On amiodarone drip. EP being consulted. PT/OT recommended skilled nursing facility on discharge.  7/24: [name]. From a fluid standpoint, he may be at the best we can get him. Given continued rise in Scr, they rec holding diuresis today and getting nephrology onboard. I have talked with nephrology and proposed a push and pull with albumin/lasix. Let's get there formal eval prior to push and pull. I was able to push him down to RA during interview, but ultimately he may need to go home on continuous O2 (was using O2 at night only and PRN prior to admission). Run of afib last night. Continue amio. INR down to 3.1 today. Follow.   Assessment & Plan:   Principal  Problem:   Acute renal failure superimposed on stage 3 chronic kidney disease (HCC) Active Problems:   Essential hypertension   History of DVT (deep vein thrombosis)   Absence epileptic syndrome, not intractable, without status epilepticus (HCC)   COPD (chronic obstructive pulmonary disease) (HCC)   Chronic respiratory failure with hypoxia (HCC)   Cellulitis of leg   Atrial fibrillation with RVR (HCC)  A. fib with RVR  - Multiple episodes of A. fib with RVR not responding to AV nodal blocking agents so started on amiodarone drip drip. - Cardiology onboard. EP consulted - CHADS-VASc is 4 (CVA x2, DM, HTN), he was already on warfarin d/t hx of DVT . - appreciate all consultants assistance - continuing PO amiodarone - 7/24: Continue amio load, run of RVR last night, follow  AKI on CKD stage IIIa  - Baseline creatinine around 1.8.  - Developed AKI with creatinine peak at 4.8 at [place] at which point antibiotics stopped and IV fluids were given.  - Question if this could be secondary to hemodynamic compromise from rapid ventricular rate versus cardiorenal syndrome from volume overload, - Normal ultrasound of the kidneys. - monitor, may need nephro consult as SCr is trending up today - 7/24: Scr up again. Spoke with both cards and nephro. This may be as dry as we are gonna get him. Lasix/metolazone held. Let's get nephro's formal eval prior to any further duresis.   Acute on chronic diastolic CHF Pericardial effusion -Echo 7/18:Left Ventricle: Poor acoustic windows limit study Cannot evaluate regional wall motion. Left ventricular ejection fraction, by estimation, is 60 to 65%. The left ventricle has normal function. Left ventricular endocardial border not optimally defined to evaluate  regional wall motion. The left ventricular internal cavity size was mildly dilated. There is no left ventricular hypertrophy. Left  ventricular  diastolic function could not be evaluated. Pericardium: Pericardial effusion is mainly posterior to LV 14 mm maximal dimenstion. No evid of filling compromise. A moderately sized pericardial effusion is present - Coreg was stopped at [facility] due to bradycardia.  - Elevated BNP in the range of 200s. -onlasix to BID dosing and gettingmetolazone this AM - 7/24: Scr up again. Spoke with both cards and nephro. This may be as dry as we are gonna get him. Lasix/metolazone held. Let's get nephro's formal eval prior to any further duresis.   COPD/chronic hypoxic respiratory failure - Uses 2 L of oxygen at baseline mainly during sleep. - Currently not in exacerbation. - Continue supplemental oxygen, as needed bronchodilators. - Wean O2 as able; got him to RA during interview today   Hypertension - hydralazine, lasix, amlodipine, imdur -imdur increased today, follow  Epilepsy - Continue phenytoin  Type 2 diabetes mellitus - home meds: metformin -On SSIand lantus. - A1c of 8.3 - DM diet - 7/24: Glucose is ok, follow  Lower extremity lymphedema, venous insufficiency/cellulitis - He was treated with antibiotics at [facility]. - Currently off antibiotics.  - Followed by wound care and recommended Unna boots. - Continue compression stockings/dressings on lower extremities.  - leukocytosis resolved; continue unna boots  Debility/deconditioning - Complains of severe generalized weakness and is unable to ambulate. - Very frustrated due to his current condition. - PT/OT recommended skilled nursing facility on discharge.  Couagulopathy - pharmacy dosing coumadin - several drug-drug interactions at play here - no frank bleed noted - INR down to 3.1, follow  DVT prophylaxis: coumadin Code Status: FULL Family Communication: Spoke with sister by phone    Status is: Inpatient  Remains inpatient appropriate because:Inpatient level of care appropriate due to severity of illness   Dispo: The patient is from: Home              Anticipated d/c is to: SNF              Anticipated d/c date is: 2 days              Patient currently is not medically stable to d/c.  Consultants:   Cardiology  Nephrology  Subjective: "This bed will never be comfortable."  Objective: Vitals:   12/20/19 2316 12/21/19 0404 12/21/19 0818 12/21/19 1208  BP: (!) 163/81 (!) 169/84 (!) 177/74 (!) 139/70  Pulse: 71 64 67 62  Resp: (!) 24 (!) 24 20 18   Temp: (!) 97.5 F (36.4 C) 97.6 F (36.4 C) 97.7 F (36.5 C) (!) 97.4 F (36.3 C)  TempSrc: Oral Oral Oral Oral  SpO2: 97% 99% 96% 90%  Weight:  (!) 138.3 kg    Height:        Intake/Output Summary (Last 24 hours) at 12/21/2019 1538 Last data filed at 12/21/2019 1200 Gross per 24 hour  Intake 360.09 ml  Output 750 ml  Net -389.91 ml   Filed Weights   12/19/19 0313 12/20/19 0346 12/21/19 0404  Weight: 132.2 kg (!) 139.3 kg (!) 138.3 kg    Examination:  General: 64 y.o. male resting in bed in NAD Cardiovascular: RRR, +S1, S2, no m/g/r Respiratory: CTABL, no w/r/r, normal WOB GI: BS+, NDNT, soft MSK: No c/c; BLE edema Neuro: A&O x 3, no focal deficits Psyc: Appropriate interaction and affect, calm/cooperative   Data Reviewed: I have personally  reviewed following labs and imaging studies.  CBC: Recent Labs  Lab 12/16/19 0427 12/18/19 0443 12/19/19 0744 12/20/19 0350 12/21/19 0345  WBC 14.9* 11.3* 9.8 10.5 11.2*  NEUTROABS 12.5* 9.0* 7.7 8.2* 9.0*  HGB 9.6* 9.8* 9.7* 9.5* 9.3*  HCT 31.7* 32.5* 32.1* 31.4* 30.6*  MCV 96.1 96.7 95.0 93.5 93.0  PLT 304 296 290 280 998   Basic Metabolic Panel: Recent Labs  Lab 12/15/19 0359 12/16/19 0427 12/17/19 0345 12/18/19 0443 12/19/19 0313 12/20/19 0350 12/21/19 0345  NA 136   < > 134* 134* 135 135 134*  K 4.6   < > 4.6 4.4 4.6 4.2 3.9  CL  91*   < > 89* 88* 88* 88* 86*  CO2 30   < > 31 34* 33* 33* 34*  GLUCOSE 216*   < > 223* 163* 144* 159* 167*  BUN 50*   < > 49* 53* 56* 55* 55*  CREATININE 2.30*   < > 2.37* 2.66* 2.78* 2.79* 2.90*  CALCIUM 8.6*   < > 9.0 9.1 8.9 8.9 8.8*  MG 1.8  --   --   --   --  1.3* 1.5*   < > = values in this interval not displayed.   GFR: Estimated Creatinine Clearance: 36.6 mL/min (A) (by C-G formula based on SCr of 2.9 mg/dL (H)). Liver Function Tests: No results for input(s): AST, ALT, ALKPHOS, BILITOT, PROT, ALBUMIN in the last 168 hours. No results for input(s): LIPASE, AMYLASE in the last 168 hours. No results for input(s): AMMONIA in the last 168 hours. Coagulation Profile: Recent Labs  Lab 12/17/19 0345 12/18/19 0443 12/19/19 0313 12/20/19 0350 12/21/19 0345  INR 3.6* 4.2* 4.0* 3.4* 3.1*   Cardiac Enzymes: No results for input(s): CKTOTAL, CKMB, CKMBINDEX, TROPONINI in the last 168 hours. BNP (last 3 results) No results for input(s): PROBNP in the last 8760 hours. HbA1C: No results for input(s): HGBA1C in the last 72 hours. CBG: Recent Labs  Lab 12/20/19 0632 12/20/19 1301 12/20/19 1611 12/21/19 0622 12/21/19 1201  GLUCAP 147* 174* 194* 149* 178*   Lipid Profile: No results for input(s): CHOL, HDL, LDLCALC, TRIG, CHOLHDL, LDLDIRECT in the last 72 hours. Thyroid Function Tests: No results for input(s): TSH, T4TOTAL, FREET4, T3FREE, THYROIDAB in the last 72 hours. Anemia Panel: No results for input(s): VITAMINB12, FOLATE, FERRITIN, TIBC, IRON, RETICCTPCT in the last 72 hours. Sepsis Labs: No results for input(s): PROCALCITON, LATICACIDVEN in the last 168 hours.  Recent Results (from the past 240 hour(s))  SARS Coronavirus 2 by RT PCR (hospital order, performed in [facility] hospital lab) Nasopharyngeal Nasopharyngeal Swab     Status: None   Collection Time: 12/14/19  2:34 AM   Specimen: Nasopharyngeal Swab  Result Value Ref Range Status   SARS Coronavirus 2 NEGATIVE  NEGATIVE Final    Comment: (NOTE) SARS-CoV-2 target nucleic acids are NOT DETECTED.  The SARS-CoV-2 RNA is generally detectable in upper and lower respiratory specimens during the acute phase of infection. The lowest concentration of SARS-CoV-2 viral copies this assay can detect is 250 copies / mL. A negative result does not preclude SARS-CoV-2 infection and should not be used as the sole basis for treatment or other patient management decisions.  A negative result may occur with improper specimen collection / handling, submission of specimen other than nasopharyngeal swab, presence of viral mutation(s) within the areas targeted by this assay, and inadequate number of viral copies (<250 copies / mL). A negative result must be combined  with clinical observations, patient history, and epidemiological information.  Fact Sheet for Patients:   StrictlyIdeas.no  Fact Sheet for Healthcare Providers: BankingDealers.co.za  This test is not yet approved or  cleared by the [place] FDA and has been authorized for detection and/or diagnosis of SARS-CoV-2 by FDA under an Emergency Use Authorization (EUA).  This EUA will remain in effect (meaning this test can be used) for the duration of the COVID-19 declaration under Section 564(b)(1) of the Act, 21 U.S.C. section 360bbb-3(b)(1), unless the authorization is terminated or revoked sooner.  Performed at [facility], [place] [address] [place], [place] [address]   SARS CORONAVIRUS 2 (TAT 6-24 HRS) Nasopharyngeal Nasopharyngeal Swab     Status: None   Collection Time: 12/19/19  6:00 PM   Specimen: Nasopharyngeal Swab  Result Value Ref Range Status   SARS Coronavirus 2 NEGATIVE NEGATIVE Final    Comment: (NOTE) SARS-CoV-2 target nucleic acids are NOT DETECTED.  The SARS-CoV-2 RNA is generally detectable in upper and lower respiratory specimens during the acute phase of infection.  Negative results do not preclude SARS-CoV-2 infection, do not rule out co-infections with other pathogens, and should not be used as the sole basis for treatment or other patient management decisions. Negative results must be combined with clinical observations, patient history, and epidemiological information. The expected result is Negative.  Fact Sheet for Patients: SugarRoll.be  Fact Sheet for Healthcare Providers: https://www.woods-mathews.com/  This test is not yet approved or cleared by the [place] FDA and  has been authorized for detection and/or diagnosis of SARS-CoV-2 by FDA under an Emergency Use Authorization (EUA). This EUA will remain  in effect (meaning this test can be used) for the duration of the COVID-19 declaration under Se ction 564(b)(1) of the Act, 21 U.S.C. section 360bbb-3(b)(1), unless the authorization is terminated or revoked sooner.  Performed at [facility], [place] [address], [place], [place] [address]       Radiology Studies: No results found.   Scheduled Meds: . amiodarone  400 mg Oral BID   Followed by  . [START ON 12/22/2019] amiodarone  200 mg Oral BID   Followed by  . [START ON 12/27/2019] amiodarone  200 mg Oral Daily  . amLODipine  10 mg Oral Daily  . hydrALAZINE  100 mg Oral Q8H  . insulin aspart  0-5 Units Subcutaneous QHS  . insulin aspart  0-9 Units Subcutaneous TID WC  . insulin aspart  2 Units Subcutaneous TID WC  . insulin glargine  10 Units Subcutaneous Daily  . [START ON 12/22/2019] isosorbide mononitrate  120 mg Oral Daily  . phenytoin  300 mg Oral BID  . pravastatin  40 mg Oral q1800  . sodium chloride flush  3 mL Intravenous Q12H  . warfarin  0.5 mg Oral ONCE-1600  . Warfarin - Pharmacist Dosing Inpatient   Does not apply q1600   Continuous Infusions: . sodium chloride       LOS: 7 days    Time spent: 25 minutes spent in the coordination of care today.     Jonnie Finner, DO [facility]  If 7PM-7AM, please contact night-coverage [email or web address] 12/21/2019, 3:38 PM

## 2019-12-21 NOTE — Progress Notes (Signed)
ANTICOAGULATION - Follow Up Consult  Pharmacy Consult for Coumadin Indication: h/o DVT and new afib  Allergies  Allergen Reactions  . Codeine    Vital Signs: Temp: 97.7 F (36.5 C) (07/24 0818) Temp Source: Oral (07/24 0818) BP: 177/74 (07/24 0818) Pulse Rate: 67 (07/24 0818)  Labs: Recent Labs    12/19/19 0313 12/19/19 0744 12/19/19 0744 12/20/19 0350 12/21/19 0345  HGB  --  9.7*   < > 9.5* 9.3*  HCT  --  32.1*  --  31.4* 30.6*  PLT  --  290  --  280 290  LABPROT 37.9*  --   --  32.9* 31.2*  INR 4.0*  --   --  3.4* 3.1*  CREATININE 2.78*  --   --  2.79* 2.90*   < > = values in this interval not displayed.    Estimated Creatinine Clearance: 36.6 mL/min (A) (by C-G formula based on SCr of 2.9 mg/dL (H)).  Medical History: Past Medical History:  Diagnosis Date  . Epilepsy, grand mal ([place])   . Frozen shoulder    Right Shoulder  . Lumbago   . Peripheral vascular disease ([place])   . Stroke [facility])    Assessment: 64 y.o. M presented from [facility] on [facility]17 with history of DVT and new onset atrial fibrillation. Patient on Coumadin PTA with home dose of 4 mg daily. Admission INR at [facility] 2.6. Patient was started on amiodarone during this admission which may potentiate the effects of warfarin.   INR almost within goal today at 3.1, down from 3.4 yesterday. Expecting another drop tomorrow since many doses have been held. Of note, amiodarone dose will be tapered to 200mg  BID tomorrow.  No s/sx bleeding noted. PO intake variable, H/H low but stable. Will give a small dose of 0.5mg  to keep patient within therapeutic range.   Goal of Therapy:  INR 2-3 Monitor platelets by anticoagulation protocol: Yes   Plan:  -Daily INR -Warfarin 0.5mg  tonight -Continue to monitor H/H, s/sx bleeding  Mercy Riding, PharmD PGY1 Acute Care Pharmacy Resident Please refer to [facility] for unit-specific pharmacist

## 2019-12-21 NOTE — Plan of Care (Signed)
  Problem: Health Behavior/Discharge Planning: Goal: Ability to manage health-related needs will improve Outcome: Not Progressing   Problem: Activity: Goal: Risk for activity intolerance will decrease Outcome: Not Progressing   

## 2019-12-21 NOTE — Progress Notes (Signed)
Progress Note  Patient Name: Matthew Gutierrez Date of Encounter: 12/21/2019  [facility] Cardiologist: Jenne Campus, MD   Subjective   Largely feels unchanged. No chest pain. Breathing unchanged, legs still swollen. Reviewed blood work, blood pressures with him. Discussed concern re: his kidneys.  Inpatient Medications    Scheduled Meds: . amiodarone  400 mg Oral BID   Followed by  . [START ON 12/22/2019] amiodarone  200 mg Oral BID   Followed by  . [START ON 12/27/2019] amiodarone  200 mg Oral Daily  . amLODipine  10 mg Oral Daily  . hydrALAZINE  100 mg Oral Q8H  . insulin aspart  0-5 Units Subcutaneous QHS  . insulin aspart  0-9 Units Subcutaneous TID WC  . insulin aspart  2 Units Subcutaneous TID WC  . insulin glargine  10 Units Subcutaneous Daily  . [START ON 12/22/2019] isosorbide mononitrate  120 mg Oral Daily  . isosorbide mononitrate  60 mg Oral Once  . phenytoin  300 mg Oral BID  . pravastatin  40 mg Oral q1800  . sodium chloride flush  3 mL Intravenous Q12H  . Warfarin - Pharmacist Dosing Inpatient   Does not apply q1600   Continuous Infusions: . sodium chloride     PRN Meds: sodium chloride, acetaminophen **OR** acetaminophen, albuterol, clonazepam, hydrALAZINE, ondansetron **OR** ondansetron (ZOFRAN) IV, oxyCODONE-acetaminophen, senna-docusate, sodium chloride flush   Vital Signs    Vitals:   12/20/19 2316 12/21/19 0404 12/21/19 0818 12/21/19 1208  BP: (!) 163/81 (!) 169/84 (!) 177/74 (!) 139/70  Pulse: 71 64 67 62  Resp: (!) 24 (!) 24 20 18   Temp: (!) 97.5 F (36.4 C) 97.6 F (36.4 C) 97.7 F (36.5 C) (!) 97.4 F (36.3 C)  TempSrc: Oral Oral Oral Oral  SpO2: 97% 99% 96% 90%  Weight:  (!) 138.3 kg    Height:        Intake/Output Summary (Last 24 hours) at 12/21/2019 1229 Last data filed at 12/21/2019 0819 Gross per 24 hour  Intake 120.09 ml  Output 750 ml  Net -629.91 ml   Last 3 Weights 12/21/2019 12/20/2019 12/19/2019  Weight (lbs) 304 lb  14.3 oz 307 lb 291 lb 6.4 oz  Weight (kg) 138.3 kg 139.254 kg 132.178 kg      Telemetry    Sinus rhythm with intermittent atrial fibrillation with RVR- Personally Reviewed  ECG    ECG today NSR at 61 bpm - Personally Reviewed  Physical Exam   GEN: No acute distress. Resting comfortably in bed at 30 degress. Neck: No JVD seen, though difficult body habitus Cardiac: RRR, no murmurs, rubs, or gallops.  Respiratory: Distant breath sounds throughout but no wheezing or appreciable crackles. GI: Soft, nontender, non-distended  MS: bilateral LE edema and changes of chronic venous stasis. Firm, nonpitting edema 2-3+ with skin discoloration and hardening, wrapped in Unna boots. Neuro:  Nonfocal  Psych: Normal affect   Labs    High Sensitivity Troponin:  No results for input(s): TROPONINIHS in the last 720 hours.    Chemistry Recent Labs  Lab 12/19/19 0313 12/20/19 0350 12/21/19 0345  NA 135 135 134*  K 4.6 4.2 3.9  CL 88* 88* 86*  CO2 33* 33* 34*  GLUCOSE 144* 159* 167*  BUN 56* 55* 55*  CREATININE 2.78* 2.79* 2.90*  CALCIUM 8.9 8.9 8.8*  GFRNONAA 23* 23* 22*  GFRAA 27* 26* 25*  ANIONGAP 14 14 14      Hematology Recent Labs  Lab 12/19/19 [phone]  12/20/19 0350 12/21/19 0345  WBC 9.8 10.5 11.2*  RBC 3.38* 3.36* 3.29*  HGB 9.7* 9.5* 9.3*  HCT 32.1* 31.4* 30.6*  MCV 95.0 93.5 93.0  MCH 28.7 28.3 28.3  MCHC 30.2 30.3 30.4  RDW 14.7 14.6 14.6  PLT 290 280 290    BNP Recent Labs  Lab 12/15/19 0359  BNP 240.9*     DDimer No results for input(s): DDIMER in the last 168 hours.   Radiology    No results found.  Cardiac Studies   2D Echocardiogram7.18.2021  1. Poor acoustic windows limit study Left ventricular ejection fraction,  by estimation, is 60 to 65%. The left ventricle has normal function. Left  ventricular endocardial border not optimally defined to evaluate regional  wall motion. The left  ventricular internal cavity size was mildly dilated. Left  ventricular  diastolic function could not be evaluated.  2. Right ventricular systolic function is grossly normal. The right  ventricular size is normal.  3. Pericardial effusion is mainly posterior to LV 14 mm maximal  dimenstion. No evid of filling compromise. . Moderate pericardial  effusion.  4. The mitral valve is normal in structure. No evidence of mitral valve  regurgitation.  5. The aortic valve is tricuspid. Aortic valve regurgitation is not  visualized. Mild aortic valve sclerosis is present, with no evidence of  aortic valve stenosis.  6. The inferior vena cava is dilated in size with >50% respiratory  variability, suggesting right atrial pressure of 8 mmHg.   Patient Profile     64 y.o. male with a hx of HTN, HLD, DM, COPDon home O2, CKD III, CVA, DVTwho is being seen for the evaluation ofparoxysmalatrial fibrillation with RVRat the request of Amrit, Adhikari, MD.  Assessment & Plan    Paroxysmal atrial fibrillation, with RVR: -continue oral amiodarone loading. Has had several episodes of RVR but mostly sinus rhythm -Anticoagulated with Coumadin, appreciate pharmacy dosing. INR 3.1 today.  Demand ischemia/elevated troponin: -Troponin level at outside hospital with a peak of 0.13 in the setting of RVR and acute on chronic CKD stage III with high suspicion for demand ischemia -Echocardiogram with normal LV function -Plan at this time is to defer ischemic work-up in the outpatient setting given that he is not an ideal candidate for nuclear stress testing given body habitus and not a catheterization or coronary CT candidate secondary to CKD -No ASA in the setting of Coumadin  Acute diastolic CHF: -Echocardiogram with LVEF of 50 to 65% -Has been receiving Lasix 40 mg BID, with metolazone. Feels unchanged. -I&O, net -7.3 L this admission based on charting -admission weight 142.4 kg, weight today 138.3 kg -Creatinine 2.90 today, up from 2.79. Nadir 2.30 this  admission -with rising creatinine, will hold on additional metolazone today. Will also hold lasix PM dose today, would reassess and monitor renal function in the AM before dosing -difficult to assess volume status given body habitus and chronic venous stasis changes -ideally would consider RHC, but this is complex on coumadin.  Hypertension: -remains elevated -limited options given renal function. No ACEi/ARB/ARNI/MRA -had bradycardia on carvedilol, would be cautious/avoid beta blocker. -Continue amlodipine 10, hydralazine 100 mg 3 times daily -increasing imdur to 120 mg daily starting today  Acute on chronic CKD stage III: -Creatinine 2.90 today, up from 2.79. Nadir 2.30 this admission -Holding metolazone and lasix today, as above. -If creatinine continues to rise, may need to ask nephrology for assistance.  HLD: -Last LDL 60 -Continue pravastatin  For questions or  updates, please contact [place] Please consult [email or web address] for contact info under     Signed, Buford Dresser, MD  12/21/2019, 12:29 PM

## 2019-12-22 DIAGNOSIS — L03119 Cellulitis of unspecified part of limb: Secondary | ICD-10-CM | POA: Diagnosis not present

## 2019-12-22 DIAGNOSIS — J449 Chronic obstructive pulmonary disease, unspecified: Secondary | ICD-10-CM | POA: Diagnosis not present

## 2019-12-22 DIAGNOSIS — I4891 Unspecified atrial fibrillation: Secondary | ICD-10-CM | POA: Diagnosis not present

## 2019-12-22 DIAGNOSIS — I872 Venous insufficiency (chronic) (peripheral): Secondary | ICD-10-CM

## 2019-12-22 DIAGNOSIS — I480 Paroxysmal atrial fibrillation: Secondary | ICD-10-CM

## 2019-12-22 LAB — BASIC METABOLIC PANEL
Anion gap: 17 — ABNORMAL HIGH (ref 5–15)
BUN: 54 mg/dL — ABNORMAL HIGH (ref 8–23)
CO2: 30 mmol/L (ref 22–32)
Calcium: 8.9 mg/dL (ref 8.9–10.3)
Chloride: 87 mmol/L — ABNORMAL LOW (ref 98–111)
Creatinine, Ser: 2.87 mg/dL — ABNORMAL HIGH (ref 0.61–1.24)
GFR calc Af Amer: 25 mL/min — ABNORMAL LOW (ref >60)
GFR calc non Af Amer: 22 mL/min — ABNORMAL LOW (ref >60)
Glucose, Bld: 165 mg/dL — ABNORMAL HIGH (ref 70–99)
Potassium: 4.1 mmol/L (ref 3.5–5.1)
Sodium: 134 mmol/L — ABNORMAL LOW (ref 135–145)

## 2019-12-22 LAB — PROTIME-INR
INR: 2.7 — ABNORMAL HIGH (ref 0.8–1.2)
Prothrombin Time: 27.7 seconds — ABNORMAL HIGH (ref 11.4–15.2)

## 2019-12-22 LAB — PHENYTOIN LEVEL, TOTAL: Phenytoin Lvl: 18.7 ug/mL (ref 10.0–20.0)

## 2019-12-22 LAB — GLUCOSE, CAPILLARY
Glucose-Capillary: 163 mg/dL — ABNORMAL HIGH (ref 70–99)
Glucose-Capillary: 203 mg/dL — ABNORMAL HIGH (ref 70–99)
Glucose-Capillary: 187 mg/dL — ABNORMAL HIGH (ref 70–99)

## 2019-12-22 LAB — ALBUMIN: Albumin: 2.7 g/dL — ABNORMAL LOW (ref 3.5–5.0)

## 2019-12-22 MED ORDER — WARFARIN SODIUM 1 MG PO TABS
1.0000 mg | ORAL_TABLET | Freq: Once | ORAL | Status: AC
  Administered 2019-12-22: 1 mg via ORAL
  Filled 2019-12-22: qty 1

## 2019-12-22 NOTE — Progress Notes (Signed)
PROGRESS NOTE    Matthew Gutierrez  RUE:[id number] DOB: Aug 19, 1955 DOA: 12/14/2019 PCP: Lillard Anes, MD   Brief Narrative:   Patient is a 64 year old male with history of DVT on Coumadin, obesity, lymphedema, peripheral vascular disease, epilepsy, CKD stage IIIb, hypertension who presented to the [facility] for theevaluationof increased swelling and redness of the left lower extremity. On presentation he was found to be hypertensive. Chest x-ray showed prominent interstitial marking reflecting volume overload or atypical infection. Patient was started on IV clindamycin for cellulitis of left lower extremity. His kidney function deteriorated reaching creatinine of 4.8. Antibiotics discontinued. Patient was transferred to [facility] for possible nephrology consultation. Renal function began to improve prior to transfer.His baseline creatinine is around 1.8. Normal ultrasound of the kidneys. He is on warfarin for anticoagulation. He has history of COPD and uses 2 L of oxygen per minute at baseline. He takes phenytoin for epilepsy. He is currently not on any antibiotics for Cellulitis which has resolved. Cardiology consulted and following. Hospital course has been remarkable for multiple episodes of A. fib with RVR. On amiodarone drip. EP being consulted. PT/OT recommended skilled nursing facility on discharge.  7/25: Scr is stable today. Continue to hold diuresis. Appreciate cards/nephro help. Imdur increased to 120 today. BP a little better after. Follow. He is in a better mood today.    Assessment & Plan:   Principal Problem:   Acute renal failure superimposed on stage 3 chronic kidney disease (HCC) Active Problems:   Essential hypertension   History of DVT (deep vein thrombosis)   Absence epileptic syndrome, not intractable, without status epilepticus (HCC)   COPD (chronic obstructive pulmonary disease) (HCC)   Chronic respiratory failure with hypoxia (HCC)   Cellulitis of  leg   Atrial fibrillation with RVR (HCC)  A. fib with RVR  - Multiple episodes of A. fib with RVR not responding to AV nodal blocking agents so started on amiodarone drip drip. - Cardiology onboard. EP consulted - CHADS-VASc is 4 (CVA x2, DM, HTN), he was already on warfarin d/t hx of DVT . - appreciate all consultants assistance - continuing PO amiodarone - 7/25: Rates look good over last 24h.   AKI on CKD stage IIIa  - Baseline creatinine around 1.8.  - Developed AKI with creatinine peak at 4.8 at [place] at which point antibiotics stopped and IV fluids were given.  - Question if this could be secondary to hemodynamic compromise from rapid ventricular rate versus cardiorenal syndrome from volume overload, - Normal ultrasound of the kidneys. - Nephro onboard PRN diuresis for now; appreciate cards/nephro assistance - 7/25: Scr stable this AM. Continue holding diuresis. Follow   Acute on chronic diastolic CHF Pericardial effusion -Echo 7/18:Left Ventricle: Poor acoustic windows limit study Cannot evaluate regional wall motion. Left ventricular ejection fraction, by estimation, is 60 to 65%. The left ventricle has normal function. Left ventricular endocardial border not optimally defined to evaluate regional wall motion. The left ventricular internal cavity size was mildly dilated. There is no left ventricular hypertrophy. Left  ventricular diastolic function could not be evaluated. Pericardium: Pericardial effusion is mainly posterior to LV 14 mm maximal dimenstion. No evid of filling compromise. A moderately sized pericardial effusion is present - Coreg was stopped at [facility] due to bradycardia.  - Elevated BNP in the range of 200s. -onlasix to BID dosing and gettingmetolazone this AM     - diuresis held d/t elevated SCr - 7/25: Scr stable this AM, continue to hold  diuresis, follow  COPD/chronic  hypoxic respiratory failure - Uses 2 L of oxygen at baseline mainly during sleep. - Currently not in exacerbation. - Continue supplemental oxygen, as needed bronchodilators. - Wean O2 as able  Hypertension - hydralazine, lasix, amlodipine, imdur -imdur increased again today, follow  Epilepsy - Continue phenytoin  Type 2 diabetes mellitus - home meds: metformin -On SSIand lantus. - A1c of 8.3 - DM diet - 7/25: glucose is acceptable  Lower extremity lymphedema, venous insufficiency/cellulitis - He was treated with antibiotics at [facility]. - Currently off antibiotics.  - Followed by wound care and recommended Unna boots. - Continue compression stockings/dressings on lower extremities.  -leukocytosis resolved; continue unna boots  Debility/deconditioning - Complains of severe generalized weakness and is unable to ambulate. - Very frustrated due to his current condition. - PT/OT recommended skilled nursing facility on discharge.  Couagulopathy - pharmacy dosing coumadin - several drug-drug interactions at play here - no frank bleed noted -INR down to 2.7, follow  DVT prophylaxis: coumadin Code Status: FULL Family Communication: None at bedside.   Status is: Inpatient  Remains inpatient appropriate because:Inpatient level of care appropriate due to severity of illness   Dispo: The patient is from: Home              Anticipated d/c is to: SNF              Anticipated d/c date is: 2 days              Patient currently is not medically stable to d/c.  Consultants:   Nephrology  Cardiology   ROS:  Denies CP, dyspnea, N, V . Remainder 10-pt ROS is negative for all not previously mentioned.  Subjective: "I understand what y'all are trying to do."  Objective: Vitals:   12/21/19 2150 12/21/19 2346 12/22/19 0500 12/22/19 0623  BP: (!) 161/68 (!)  163/76 (!) 157/90 (!) 157/83  Pulse:  66 63   Resp:  20 18   Temp:  97.7 F (36.5 C) 98.3 F (36.8 C)   TempSrc:  Oral Oral   SpO2:  99% 97%   Weight:   (!) 137.1 kg   Height:        Intake/Output Summary (Last 24 hours) at 12/22/2019 0753 Last data filed at 12/22/2019 0602 Gross per 24 hour  Intake 360 ml  Output 1200 ml  Net -840 ml   Filed Weights   12/20/19 0346 12/21/19 0404 12/22/19 0500  Weight: (!) 139.3 kg (!) 138.3 kg (!) 137.1 kg    Examination:  General: 64 y.o. male resting in chair in NAD Cardiovascular: RRR, +S1, S2, no m/g/r, equal pulses throughout Respiratory: CTABL, no w/r/r, normal WOB, on 2L [place] GI: BS+, NDNT, no masses noted, no organomegaly noted MSK: No c/c; BLE edema chronic Neuro: A&O x 3, no focal deficits Psyc: Appropriate interaction and affect, calm/cooperative  Data Reviewed: I have personally reviewed following labs and imaging studies.  CBC: Recent Labs  Lab 12/16/19 0427 12/18/19 0443 12/19/19 0744 12/20/19 0350 12/21/19 0345  WBC 14.9* 11.3* 9.8 10.5 11.2*  NEUTROABS 12.5* 9.0* 7.7 8.2* 9.0*  HGB 9.6* 9.8* 9.7* 9.5* 9.3*  HCT 31.7* 32.5* 32.1* 31.4* 30.6*  MCV 96.1 96.7 95.0 93.5 93.0  PLT 304 296 290 280 426   Basic Metabolic Panel: Recent Labs  Lab 12/18/19 0443 12/19/19 0313 12/20/19 0350 12/21/19 0345 12/22/19 0405  NA 134* 135 135 134* 134*  K 4.4 4.6 4.2 3.9 4.1  CL  88* 88* 88* 86* 87*  CO2 34* 33* 33* 34* 30  GLUCOSE 163* 144* 159* 167* 165*  BUN 53* 56* 55* 55* 54*  CREATININE 2.66* 2.78* 2.79* 2.90* 2.87*  CALCIUM 9.1 8.9 8.9 8.8* 8.9  MG  --   --  1.3* 1.5*  --    GFR: Estimated Creatinine Clearance: 36.8 mL/min (A) (by C-G formula based on SCr of 2.87 mg/dL (H)). Liver Function Tests: No results for input(s): AST, ALT, ALKPHOS, BILITOT, PROT, ALBUMIN in the last 168 hours. No results for input(s): LIPASE, AMYLASE in the last 168 hours. No results for input(s): AMMONIA in the last 168  hours. Coagulation Profile: Recent Labs  Lab 12/18/19 0443 12/19/19 0313 12/20/19 0350 12/21/19 0345 12/22/19 0405  INR 4.2* 4.0* 3.4* 3.1* 2.7*   Cardiac Enzymes: No results for input(s): CKTOTAL, CKMB, CKMBINDEX, TROPONINI in the last 168 hours. BNP (last 3 results) No results for input(s): PROBNP in the last 8760 hours. HbA1C: No results for input(s): HGBA1C in the last 72 hours. CBG: Recent Labs  Lab 12/21/19 0622 12/21/19 1201 12/21/19 1623 12/21/19 2140 12/22/19 0604  GLUCAP 149* 178* 172* 161* 163*   Lipid Profile: No results for input(s): CHOL, HDL, LDLCALC, TRIG, CHOLHDL, LDLDIRECT in the last 72 hours. Thyroid Function Tests: No results for input(s): TSH, T4TOTAL, FREET4, T3FREE, THYROIDAB in the last 72 hours. Anemia Panel: No results for input(s): VITAMINB12, FOLATE, FERRITIN, TIBC, IRON, RETICCTPCT in the last 72 hours. Sepsis Labs: No results for input(s): PROCALCITON, LATICACIDVEN in the last 168 hours.  Recent Results (from the past 240 hour(s))  SARS Coronavirus 2 by RT PCR (hospital order, performed in [facility] hospital lab) Nasopharyngeal Nasopharyngeal Swab     Status: None   Collection Time: 12/14/19  2:34 AM   Specimen: Nasopharyngeal Swab  Result Value Ref Range Status   SARS Coronavirus 2 NEGATIVE NEGATIVE Final    Comment: (NOTE) SARS-CoV-2 target nucleic acids are NOT DETECTED.  The SARS-CoV-2 RNA is generally detectable in upper and lower respiratory specimens during the acute phase of infection. The lowest concentration of SARS-CoV-2 viral copies this assay can detect is 250 copies / mL. A negative result does not preclude SARS-CoV-2 infection and should not be used as the sole basis for treatment or other patient management decisions.  A negative result may occur with improper specimen collection / handling, submission of specimen other than nasopharyngeal swab, presence of viral mutation(s) within the areas targeted by this assay,  and inadequate number of viral copies (<250 copies / mL). A negative result must be combined with clinical observations, patient history, and epidemiological information.  Fact Sheet for Patients:   StrictlyIdeas.no  Fact Sheet for Healthcare Providers: BankingDealers.co.za  This test is not yet approved or  cleared by the [place] FDA and has been authorized for detection and/or diagnosis of SARS-CoV-2 by FDA under an Emergency Use Authorization (EUA).  This EUA will remain in effect (meaning this test can be used) for the duration of the COVID-19 declaration under Section 564(b)(1) of the Act, 21 U.S.C. section 360bbb-3(b)(1), unless the authorization is terminated or revoked sooner.  Performed at [facility], [place] [address] [place], [place] [address]   SARS CORONAVIRUS 2 (TAT 6-24 HRS) Nasopharyngeal Nasopharyngeal Swab     Status: None   Collection Time: 12/19/19  6:00 PM   Specimen: Nasopharyngeal Swab  Result Value Ref Range Status   SARS Coronavirus 2 NEGATIVE NEGATIVE Final    Comment: (NOTE) SARS-CoV-2  target nucleic acids are NOT DETECTED.  The SARS-CoV-2 RNA is generally detectable in upper and lower respiratory specimens during the acute phase of infection. Negative results do not preclude SARS-CoV-2 infection, do not rule out co-infections with other pathogens, and should not be used as the sole basis for treatment or other patient management decisions. Negative results must be combined with clinical observations, patient history, and epidemiological information. The expected result is Negative.  Fact Sheet for Patients: SugarRoll.be  Fact Sheet for Healthcare Providers: https://www.woods-mathews.com/  This test is not yet approved or cleared by the [place] FDA and  has been authorized for detection and/or diagnosis of SARS-CoV-2 by FDA under an Emergency  Use Authorization (EUA). This EUA will remain  in effect (meaning this test can be used) for the duration of the COVID-19 declaration under Se ction 564(b)(1) of the Act, 21 U.S.C. section 360bbb-3(b)(1), unless the authorization is terminated or revoked sooner.  Performed at [facility], [place] [address] [place], [place] [address]       Radiology Studies: US RENAL  Result Date: 12/21/2019 CLINICAL DATA:  Acute kidney injury, stage 3 chronic kidney disease EXAM: RENAL / URINARY TRACT ULTRASOUND COMPLETE COMPARISON:  12/09/2019 FINDINGS: Right Kidney: Renal measurements: 13.0 x 5.0 x 6.3 cm = volume: 205 mL . Echogenicity within normal limits. No mass or hydronephrosis visualized. Left Kidney: Renal measurements: 11.7 x 5.1 x 5.3 cm = volume: 164 mL. Echogenicity within normal limits. No mass or hydronephrosis visualized. Bladder: Appears normal for degree of bladder distention. Other: None. IMPRESSION: Normal renal sonogram Electronically Signed   By: Fidela Salisbury MD   On: 12/21/2019 22:50     Scheduled Meds: . amiodarone  200 mg Oral BID   Followed by  . [START ON 12/27/2019] amiodarone  200 mg Oral Daily  . amLODipine  10 mg Oral Daily  . hydrALAZINE  100 mg Oral Q8H  . insulin aspart  0-5 Units Subcutaneous QHS  . insulin aspart  0-9 Units Subcutaneous TID WC  . insulin aspart  2 Units Subcutaneous TID WC  . insulin glargine  10 Units Subcutaneous Daily  . isosorbide mononitrate  120 mg Oral Daily  . phenytoin  300 mg Oral BID  . pravastatin  40 mg Oral q1800  . sodium chloride flush  3 mL Intravenous Q12H  . Warfarin - Pharmacist Dosing Inpatient   Does not apply q1600   Continuous Infusions: . sodium chloride       LOS: 8 days    Time spent: 25 minutes spent in the coordination of care today.    Jonnie Finner, DO [facility]  If 7PM-7AM, please contact night-coverage [email or web address] 12/22/2019, 7:53 AM

## 2019-12-22 NOTE — Progress Notes (Signed)
Progress Note  Patient Name: Matthew Gutierrez Date of Encounter: 12/22/2019  [facility] Cardiologist: Jenne Campus, MD   Subjective   Sitting in chair bedside. Reports fatigue, shortness of breath similar to prior. No chest pain.  Inpatient Medications    Scheduled Meds: . amiodarone  200 mg Oral BID   Followed by  . [START ON 12/27/2019] amiodarone  200 mg Oral Daily  . amLODipine  10 mg Oral Daily  . hydrALAZINE  100 mg Oral Q8H  . insulin aspart  0-5 Units Subcutaneous QHS  . insulin aspart  0-9 Units Subcutaneous TID WC  . insulin aspart  2 Units Subcutaneous TID WC  . insulin glargine  10 Units Subcutaneous Daily  . isosorbide mononitrate  120 mg Oral Daily  . pravastatin  40 mg Oral q1800  . sodium chloride flush  3 mL Intravenous Q12H  . warfarin  1 mg Oral ONCE-1600  . Warfarin - Pharmacist Dosing Inpatient   Does not apply q1600   Continuous Infusions: . sodium chloride     PRN Meds: sodium chloride, acetaminophen **OR** acetaminophen, albuterol, clonazepam, hydrALAZINE, ondansetron **OR** ondansetron (ZOFRAN) IV, oxyCODONE-acetaminophen, senna-docusate, sodium chloride flush   Vital Signs    Vitals:   12/22/19 0623 12/22/19 0826 12/22/19 0945 12/22/19 1140  BP: (!) 157/83 (!) 181/78 (!) 168/84 (!) 150/84  Pulse:  65  64  Resp:  20  19  Temp:  (!) 97.5 F (36.4 C)  97.6 F (36.4 C)  TempSrc:  Oral  Oral  SpO2:  93%  95%  Weight:      Height:        Intake/Output Summary (Last 24 hours) at 12/22/2019 1412 Last data filed at 12/22/2019 1201 Gross per 24 hour  Intake 240 ml  Output 1550 ml  Net -1310 ml   Last 3 Weights 12/22/2019 12/21/2019 12/20/2019  Weight (lbs) 302 lb 4.8 oz 304 lb 14.3 oz 307 lb  Weight (kg) 137.122 kg 138.3 kg 139.254 kg      Telemetry    Sinus rhythm, no significant afib in last 24 hours- Personally Reviewed  ECG    ECG today sinus bradycardia at 58 bpm - Personally Reviewed  Physical Exam   GEN: Well  nourished, well developed in no acute distress. Sitting in recliner, [place] in palce NECK: No JVD visualized, though difficult body habitus CARDIAC: regular rhythm, normal S1 and S2, no rubs or gallops. No murmur. VASCULAR: Radial and DP pulses 2+ bilaterally. No carotid bruits RESPIRATORY:  Distant breath sounds without appreciable wheeze or rales ABDOMEN: Soft, non-tender, non-distended MUSCULOSKELETAL:  Moves all 4 limbs independently SKIN: Bilateral LE chronic venous stasis changes with firm, nonpitting edema 2-3+ NEUROLOGIC:  No focal neuro deficits noted. PSYCHIATRIC:  Normal affect   Labs    High Sensitivity Troponin:  No results for input(s): TROPONINIHS in the last 720 hours.    Chemistry Recent Labs  Lab 12/20/19 0350 12/21/19 0345 12/22/19 0405 12/22/19 0832  NA 135 134* 134*  --   K 4.2 3.9 4.1  --   CL 88* 86* 87*  --   CO2 33* 34* 30  --   GLUCOSE 159* 167* 165*  --   BUN 55* 55* 54*  --   CREATININE 2.79* 2.90* 2.87*  --   CALCIUM 8.9 8.8* 8.9  --   ALBUMIN  --   --   --  2.7*  GFRNONAA 23* 22* 22*  --   GFRAA 26* 25* 25*  --  ANIONGAP 14 14 17*  --      Hematology Recent Labs  Lab 12/19/19 0744 12/20/19 0350 12/21/19 0345  WBC 9.8 10.5 11.2*  RBC 3.38* 3.36* 3.29*  HGB 9.7* 9.5* 9.3*  HCT 32.1* 31.4* 30.6*  MCV 95.0 93.5 93.0  MCH 28.7 28.3 28.3  MCHC 30.2 30.3 30.4  RDW 14.7 14.6 14.6  PLT 290 280 290    BNP No results for input(s): BNP, PROBNP in the last 168 hours.   DDimer No results for input(s): DDIMER in the last 168 hours.   Radiology    US RENAL  Result Date: 12/21/2019 CLINICAL DATA:  Acute kidney injury, stage 3 chronic kidney disease EXAM: RENAL / URINARY TRACT ULTRASOUND COMPLETE COMPARISON:  12/09/2019 FINDINGS: Right Kidney: Renal measurements: 13.0 x 5.0 x 6.3 cm = volume: 205 mL . Echogenicity within normal limits. No mass or hydronephrosis visualized. Left Kidney: Renal measurements: 11.7 x 5.1 x 5.3 cm = volume: 164 mL.  Echogenicity within normal limits. No mass or hydronephrosis visualized. Bladder: Appears normal for degree of bladder distention. Other: None. IMPRESSION: Normal renal sonogram Electronically Signed   By: Fidela Salisbury MD   On: 12/21/2019 22:50    Cardiac Studies   2D Echocardiogram7.18.2021  1. Poor acoustic windows limit study Left ventricular ejection fraction,  by estimation, is 60 to 65%. The left ventricle has normal function. Left  ventricular endocardial border not optimally defined to evaluate regional  wall motion. The left  ventricular internal cavity size was mildly dilated. Left ventricular  diastolic function could not be evaluated.  2. Right ventricular systolic function is grossly normal. The right  ventricular size is normal.  3. Pericardial effusion is mainly posterior to LV 14 mm maximal  dimenstion. No evid of filling compromise. . Moderate pericardial  effusion.  4. The mitral valve is normal in structure. No evidence of mitral valve  regurgitation.  5. The aortic valve is tricuspid. Aortic valve regurgitation is not  visualized. Mild aortic valve sclerosis is present, with no evidence of  aortic valve stenosis.  6. The inferior vena cava is dilated in size with >50% respiratory  variability, suggesting right atrial pressure of 8 mmHg.   Patient Profile     64 y.o. male with a hx of HTN, HLD, DM, COPDon home O2, CKD III, CVA, DVTwho is being seen for the evaluation ofparoxysmalatrial fibrillation with RVRat the request of Amrit, Adhikari, MD.  Assessment & Plan    Paroxysmal atrial fibrillation, with RVR: -continue oral amiodarone loading. Has had several episodes of RVR this admission but mostly sinus rhythm -Anticoagulated with Coumadin, appreciate pharmacy dosing. INR 2.7 today.  Demand ischemia/elevated troponin: -Troponin level at outside hospital with a peak of 0.13 in the setting of RVR and acute on chronic CKD stage III with high  suspicion for demand ischemia -Echocardiogram with normal LV function -Plan at this time is to defer ischemic work-up in the outpatient setting given that he is not an ideal candidate for nuclear stress testing given body habitus and not a catheterization or coronary CT candidate secondary to CKD -No ASA in the setting of Coumadin  Acute diastolic CHF: -Echocardiogram with LVEF of 50 to 65% -Had been receiving Lasix 40 mg BID, with metolazone, without clinical improvement -admission weight 142.4 kg, weight today 137.1 kg -monitoring renal function closely, as below -difficult to assess volume status given body habitus and chronic venous stasis changes. However, with lack of clinical improvement and rising Cr, I recommended  holding diuretics 7/24. Appreciate nephrology assistance as well. He has significant chronic venous insufficiency which is unlikely to resolve with diuresis. -ideally would consider RHC, but this is complex on coumadin.  Hypertension: -remains labile, range 139/70-181/78 in recent readings -limited options given renal function. No ACEi/ARB/ARNI/MRA -had bradycardia on carvedilol, would be cautious/avoid beta blocker. -Continue amlodipine 10 mg daily, hydralazine 100 mg 3 times daily -increased imdur to 120 mg daily starting 7/24 -not ideal, but if cannot get significant improvement, may need to consider clonidine.  Acute on chronic CKD stage III: -Creatinine 2.87 today, up from 2.79 at admission but down from 2.90 yesterday. Nadir 2.30 this admission -Holding metolazone and lasix today, as above. -appreciate nephrology assistance  HLD: -Last LDL 60 -Continue pravastatin  For questions or updates, please contact [place] Please consult [email or web address] for contact info under     Signed, Buford Dresser, MD  12/22/2019, 2:12 PM

## 2019-12-22 NOTE — Progress Notes (Signed)
Phenytoin Follow-Up Consult Indication: epilepsy  Allergies  Allergen Reactions  . Codeine     Patient Measurements: Height: 6' (182.9 cm) Weight: (!) 137.1 kg (302 lb 4.8 oz) IBW/kg (Calculated) : 77.6 TPN AdjBW (KG): 93.8 Body mass index is 41 kg/m.   Vital signs: Temp: 97.5 F (36.4 C) (07/25 0826) Temp Source: Oral (07/25 0826) BP: 168/84 (07/25 0945) Pulse Rate: 65 (07/25 0826)  Labs: Lab Results  Component Value Date/Time   Albumin 2.7 (L) 12/22/2019 0832   Phenytoin Lvl 18.7 12/22/2019 0832   Lab Results  Component Value Date   PHENYTOIN 18.7 12/22/2019   Estimated Creatinine Clearance: 36.8 mL/min (A) (by C-G formula based on SCr of 2.87 mg/dL (H)).   Medications:  Scheduled:  . amiodarone  200 mg Oral BID   Followed by  . [START ON 12/27/2019] amiodarone  200 mg Oral Daily  . amLODipine  10 mg Oral Daily  . hydrALAZINE  100 mg Oral Q8H  . insulin aspart  0-5 Units Subcutaneous QHS  . insulin aspart  0-9 Units Subcutaneous TID WC  . insulin aspart  2 Units Subcutaneous TID WC  . insulin glargine  10 Units Subcutaneous Daily  . isosorbide mononitrate  120 mg Oral Daily  . phenytoin  300 mg Oral BID  . pravastatin  40 mg Oral q1800  . sodium chloride flush  3 mL Intravenous Q12H  . Warfarin - Pharmacist Dosing Inpatient   Does not apply q1600    Assessment: Home dose: Dilantin 300mg  BID. Low albumin and poor renal fxn may increase free phenytoin levels. -7/25 Phenytoin level 18.7 - corrected for albumin of 2.7 >> 29.2, supratherapeutic -AM phenytoin dose already given this AM, will hold dose tonight and recheck 7/26 AM  Corrected phenytoin level (if needed): 29.2 Seizure activity: none charted Significant potential drug interactions: warfarin   Goals of care:  Total phenytoin level: 10-20 mcg/ml Free phenytoin level: 1-2 mcg/ml  Plan:  Hold phenytoin dose tonight Recheck level in the morning and dose accordingly if needed   Mercy Riding,  PharmD PGY1 Acute Care Pharmacy Resident Please refer to [facility] for unit-specific pharmacist

## 2019-12-22 NOTE — Progress Notes (Signed)
[facility] Progress Note    Assessment/ Plan:   1.AKI on CKD 3 (baseline Cr ~1.5-2). On presentation his rise in creatinine was likely on the basis of cardiorenal syndrome +/- infection, now his AKI likely related to diuresis/prerenal injury as evident by contraction alkalosis. Non-oliguric -renal fntn slightly improved/stable -hold diuretics for today, would recommend spot/prn diuresis, may benefit (long term) with PO torsemide -normal renal u/s -Avoid nephrotoxic medications including NSAIDs and iodinated intravenous contrast exposure unless the latter is absolutely indicated.  Preferred narcotic agents for pain control are hydromorphone, fentanyl, and methadone. Morphine should not be used. Avoid Baclofen and avoid oral sodium phosphate and magnesium citrate based laxatives / bowel preps. Continue strict Input and Output monitoring. Will monitor the patient closely with you and intervene or adjust therapy as indicated by changes in clinical status/labs  2. Contraction alkalosis, improved 3. Acute dCHF, see above 4. Hypoalbuminemia 5. HTN -imdur, hydralazine, norvasc 6. DM2 7. Lymphedema  [name], MD [facility] 12/22/2019, 2:14 PM   Subjective:   Patient reports his breathing is stable.  No changes in his swelling of his lower extremities.  No acute events   Objective:   BP (!) 150/84 (BP Location: Right Arm)   Pulse 64   Temp 97.6 F (36.4 C) (Oral)   Resp 19   Ht 6' (1.829 m)   Wt (!) 137.1 kg   SpO2 95%   BMI 41.00 kg/m   Intake/Output Summary (Last 24 hours) at 12/22/2019 1414 Last data filed at 12/22/2019 1201 Gross per 24 hour  Intake 240 ml  Output 1550 ml  Net -1310 ml   Weight change: -1.178 kg  Physical Exam: GEN: wdwn, sitting up in chair, nad EYES: no scleral icterus, eomi CV: S1-S2, RRR, no M/R/G PULM: Clear to auscultation bilaterally, no wheezes/rhonchi/rales/crackles, normal tactile vocal fremitus, unlabored,  bilateral chest expansion ABD: Soft, nontender/nondistended SKIN: chronic stasis changes bilateral LE's EXT: firm edema, currently wrapped Neuro: AAOx3, no focal deficits  Imaging: US RENAL  Result Date: 12/21/2019 CLINICAL DATA:  Acute kidney injury, stage 3 chronic kidney disease EXAM: RENAL / URINARY TRACT ULTRASOUND COMPLETE COMPARISON:  12/09/2019 FINDINGS: Right Kidney: Renal measurements: 13.0 x 5.0 x 6.3 cm = volume: 205 mL . Echogenicity within normal limits. No mass or hydronephrosis visualized. Left Kidney: Renal measurements: 11.7 x 5.1 x 5.3 cm = volume: 164 mL. Echogenicity within normal limits. No mass or hydronephrosis visualized. Bladder: Appears normal for degree of bladder distention. Other: None. IMPRESSION: Normal renal sonogram Electronically Signed   By: Fidela Salisbury MD   On: 12/21/2019 22:50    Labs: BMET Recent Labs  Lab 12/16/19 4917 12/17/19 0345 12/18/19 0443 12/19/19 0313 12/20/19 0350 12/21/19 0345 12/22/19 0405  NA 134* 134* 134* 135 135 134* 134*  K 4.7 4.6 4.4 4.6 4.2 3.9 4.1  CL 90* 89* 88* 88* 88* 86* 87*  CO2 31 31 34* 33* 33* 34* 30  GLUCOSE 226* 223* 163* 144* 159* 167* 165*  BUN 47* 49* 53* 56* 55* 55* 54*  CREATININE 2.37* 2.37* 2.66* 2.78* 2.79* 2.90* 2.87*  CALCIUM 8.9 9.0 9.1 8.9 8.9 8.8* 8.9   CBC Recent Labs  Lab 12/18/19 0443 12/19/19 0744 12/20/19 0350 12/21/19 0345  WBC 11.3* 9.8 10.5 11.2*  NEUTROABS 9.0* 7.7 8.2* 9.0*  HGB 9.8* 9.7* 9.5* 9.3*  HCT 32.5* 32.1* 31.4* 30.6*  MCV 96.7 95.0 93.5 93.0  PLT 296 290 280 290    Medications:    .  amiodarone  200 mg Oral BID   Followed by  . [START ON 12/27/2019] amiodarone  200 mg Oral Daily  . amLODipine  10 mg Oral Daily  . hydrALAZINE  100 mg Oral Q8H  . insulin aspart  0-5 Units Subcutaneous QHS  . insulin aspart  0-9 Units Subcutaneous TID WC  . insulin aspart  2 Units Subcutaneous TID WC  . insulin glargine  10 Units Subcutaneous Daily  . isosorbide mononitrate   120 mg Oral Daily  . pravastatin  40 mg Oral q1800  . sodium chloride flush  3 mL Intravenous Q12H  . warfarin  1 mg Oral ONCE-1600  . Warfarin - Pharmacist Dosing Inpatient   Does not apply [id number]

## 2019-12-22 NOTE — Progress Notes (Signed)
ANTICOAGULATION - Follow Up Consult  Pharmacy Consult for Coumadin Indication: h/o DVT and new afib  Allergies  Allergen Reactions  . Codeine    Vital Signs: Temp: 98.3 F (36.8 C) (07/25 0500) Temp Source: Oral (07/25 0500) BP: 157/83 (07/25 0623) Pulse Rate: 63 (07/25 0500)  Labs: Recent Labs    12/20/19 0350 12/21/19 0345 12/22/19 0405  HGB 9.5* 9.3*  --   HCT 31.4* 30.6*  --   PLT 280 290  --   LABPROT 32.9* 31.2* 27.7*  INR 3.4* 3.1* 2.7*  CREATININE 2.79* 2.90* 2.87*    Estimated Creatinine Clearance: 36.8 mL/min (A) (by C-G formula based on SCr of 2.87 mg/dL (H)).  Medical History: Past Medical History:  Diagnosis Date  . Epilepsy, grand mal ([place])   . Frozen shoulder    Right Shoulder  . Lumbago   . Peripheral vascular disease ([place])   . Stroke [facility])    Assessment: 64 y.o. M presented from [facility] on 7/17 with history of DVT and new onset atrial fibrillation. Patient on Coumadin PTA with home dose of 4 mg daily. Admission INR at [facility] 2.6. Patient was started on amiodarone during this admission which may potentiate the effects of warfarin.   INR within goal today at 2.7, down from 3.1 yesterday. Expecting held doses to catch up and drop INR further tomorrow. Of note, amiodarone dose will be tapered to 200mg  BID today.  No s/sx bleeding noted. PO intake variable, H/H low but stable. Albumin level low at 2.7. Will give another small dose of warfarin 1mg  to keep patient within therapeutic range.   Goal of Therapy:  INR 2-3 Monitor platelets by anticoagulation protocol: Yes   Plan:  -Daily INR -Warfarin 1mg  tonight -Continue to monitor H/H, s/sx bleeding  Mercy Riding, PharmD PGY1 Acute Care Pharmacy Resident Please refer to [facility] for unit-specific pharmacist

## 2019-12-23 DIAGNOSIS — J449 Chronic obstructive pulmonary disease, unspecified: Secondary | ICD-10-CM | POA: Diagnosis not present

## 2019-12-23 DIAGNOSIS — L03119 Cellulitis of unspecified part of limb: Secondary | ICD-10-CM | POA: Diagnosis not present

## 2019-12-23 DIAGNOSIS — I4891 Unspecified atrial fibrillation: Secondary | ICD-10-CM | POA: Diagnosis not present

## 2019-12-23 LAB — CBC WITH DIFFERENTIAL/PLATELET
Abs Immature Granulocytes: 0.09 10*3/uL — ABNORMAL HIGH (ref 0.00–0.07)
Basophils Absolute: 0.1 10*3/uL (ref 0.0–0.1)
Basophils Relative: 1 %
Eosinophils Absolute: 0.1 10*3/uL (ref 0.0–0.5)
Eosinophils Relative: 1 %
HCT: 29.5 % — ABNORMAL LOW (ref 39.0–52.0)
Hemoglobin: 9 g/dL — ABNORMAL LOW (ref 13.0–17.0)
Immature Granulocytes: 1 %
Lymphocytes Relative: 8 %
Lymphs Abs: 0.9 10*3/uL (ref 0.7–4.0)
MCH: 28 pg (ref 26.0–34.0)
MCHC: 30.5 g/dL (ref 30.0–36.0)
MCV: 91.9 fL (ref 80.0–100.0)
Monocytes Absolute: 0.9 10*3/uL (ref 0.1–1.0)
Monocytes Relative: 8 %
Neutro Abs: 9.1 10*3/uL — ABNORMAL HIGH (ref 1.7–7.7)
Neutrophils Relative %: 81 %
Platelets: 303 10*3/uL (ref 150–400)
RBC: 3.21 MIL/uL — ABNORMAL LOW (ref 4.22–5.81)
RDW: 14.7 % (ref 11.5–15.5)
WBC: 11.3 10*3/uL — ABNORMAL HIGH (ref 4.0–10.5)
nRBC: 0 % (ref 0.0–0.2)

## 2019-12-23 LAB — BASIC METABOLIC PANEL
Anion gap: 15 (ref 5–15)
BUN: 57 mg/dL — ABNORMAL HIGH (ref 8–23)
CO2: 33 mmol/L — ABNORMAL HIGH (ref 22–32)
Calcium: 8.9 mg/dL (ref 8.9–10.3)
Chloride: 85 mmol/L — ABNORMAL LOW (ref 98–111)
Creatinine, Ser: 3.08 mg/dL — ABNORMAL HIGH (ref 0.61–1.24)
GFR calc Af Amer: 23 mL/min — ABNORMAL LOW (ref >60)
GFR calc non Af Amer: 20 mL/min — ABNORMAL LOW (ref >60)
Glucose, Bld: 180 mg/dL — ABNORMAL HIGH (ref 70–99)
Potassium: 4.1 mmol/L (ref 3.5–5.1)
Sodium: 133 mmol/L — ABNORMAL LOW (ref 135–145)

## 2019-12-23 LAB — GLUCOSE, CAPILLARY
Glucose-Capillary: 200 mg/dL — ABNORMAL HIGH (ref 70–99)
Glucose-Capillary: 177 mg/dL — ABNORMAL HIGH (ref 70–99)
Glucose-Capillary: 189 mg/dL — ABNORMAL HIGH (ref 70–99)
Glucose-Capillary: 175 mg/dL — ABNORMAL HIGH (ref 70–99)
Glucose-Capillary: 189 mg/dL — ABNORMAL HIGH (ref 70–99)

## 2019-12-23 LAB — ALBUMIN: Albumin: 2.7 g/dL — ABNORMAL LOW (ref 3.5–5.0)

## 2019-12-23 LAB — PROTIME-INR
INR: 2.9 — ABNORMAL HIGH (ref 0.8–1.2)
Prothrombin Time: 29.2 seconds — ABNORMAL HIGH (ref 11.4–15.2)

## 2019-12-23 LAB — MAGNESIUM: Magnesium: 1.4 mg/dL — ABNORMAL LOW (ref 1.7–2.4)

## 2019-12-23 LAB — PHENYTOIN LEVEL, TOTAL: Phenytoin Lvl: 16.5 ug/mL (ref 10.0–20.0)

## 2019-12-23 MED ORDER — WARFARIN 0.5 MG HALF TABLET
0.5000 mg | ORAL_TABLET | Freq: Once | ORAL | Status: AC
  Administered 2019-12-23: 0.5 mg via ORAL
  Filled 2019-12-23: qty 1

## 2019-12-23 MED ORDER — INSULIN GLARGINE 100 UNIT/ML ~~LOC~~ SOLN
12.0000 [IU] | Freq: Every day | SUBCUTANEOUS | Status: DC
  Administered 2019-12-24 – 2019-12-27 (×4): 12 [IU] via SUBCUTANEOUS
  Filled 2019-12-23 (×4): qty 0.12

## 2019-12-23 MED ORDER — MAGNESIUM SULFATE IN D5W 1-5 GM/100ML-% IV SOLN
1.0000 g | Freq: Once | INTRAVENOUS | Status: AC
  Administered 2019-12-23: 1 g via INTRAVENOUS
  Filled 2019-12-23: qty 100

## 2019-12-23 NOTE — Progress Notes (Signed)
Phenytoin Follow-Up Consult Indication: epilepsy  Allergies  Allergen Reactions  . Codeine     Patient Measurements: Height: 6' (182.9 cm) Weight: (!) 137.3 kg (302 lb 11.1 oz) IBW/kg (Calculated) : 77.6 TPN AdjBW (KG): 93.8 Body mass index is 41.05 kg/m.   Vital signs: Temp: 97.6 F (36.4 C) (07/26 0505) Temp Source: Oral (07/26 0505) BP: 154/57 (07/26 0505) Pulse Rate: 70 (07/26 0505)  Labs: Lab Results  Component Value Date/Time   Albumin 2.7 (L) 12/23/2019 0241   Albumin 2.7 (L) 12/22/2019 0832   Phenytoin Lvl 16.5 12/23/2019 0241   Phenytoin Lvl 18.7 12/22/2019 0832   Lab Results  Component Value Date   PHENYTOIN 16.5 12/23/2019   Estimated Creatinine Clearance: 34.3 mL/min (A) (by C-G formula based on SCr of 3.08 mg/dL (H)).   Medications:  Scheduled:  . amiodarone  200 mg Oral BID   Followed by  . [START ON 12/27/2019] amiodarone  200 mg Oral Daily  . amLODipine  10 mg Oral Daily  . hydrALAZINE  100 mg Oral Q8H  . insulin aspart  0-5 Units Subcutaneous QHS  . insulin aspart  0-9 Units Subcutaneous TID WC  . insulin aspart  2 Units Subcutaneous TID WC  . insulin glargine  10 Units Subcutaneous Daily  . isosorbide mononitrate  120 mg Oral Daily  . pravastatin  40 mg Oral q1800  . sodium chloride flush  3 mL Intravenous Q12H  . Warfarin - Pharmacist Dosing Inpatient   Does not apply q1600    Assessment: Home dose: Dilantin 300mg  BID. Low albumin and poor renal fxn may increase free phenytoin levels. On 7/25 phenytoin level was found to be supratherapeutic and phenytoin was held. Last dose of phenytoin was administered at ~0930 on 7/25. Phenytoin level this morning was found to be 16.5 which corrected for low albumin is 25.8 and supratherapeutic.   Corrected phenytoin level (if needed): 25.8 Seizure activity: none charted Significant potential drug interactions: warfarin   Goals of care:  Total phenytoin level: 10-20 mcg/ml Free phenytoin level: 1-2  mcg/ml  Plan:  Hold phenytoin today Recheck level in the morning and dose accordingly if needed   Cristela Felt, PharmD Clinical Pharmacist

## 2019-12-23 NOTE — Progress Notes (Signed)
[facility] Progress Note    Assessment/ Plan:   1. AKI on CKD3 1. Likely related to diuretics, cardiorenal syndrome, was taking ACE inhibitor at presentation 2. Nonoliguric 3. Diuretics currently on hold 4. No indication for dialysis 2. Acute on chronic HFpEF, improved but not resolved, cardiology following 3. Hypertension, stable 4. Chronic lymphedema, legs wrapped   Likely resume oral diuretics, will try torsemide, tomorrow if creatinine is stable  Daily weights, Daily Renal Panel, Strict I/Os, Avoid nephrotoxins (NSAIDs, judicious IV Contrast)   [facility] 12/22/2019, 2:14 PM   Subjective:    No complaints  Legs remain swollen but he states much improved  Creatinine slightly worse at 3.1 K4.1, bicarbonate 33  Weight down approximately 5 kg from presentation, net -9 L  Diuretics currently held  Objective:   BP (!) 123/63   Pulse 64   Temp (!) 97.4 F (36.3 C) (Oral)   Resp 21   Ht 6' (1.829 m)   Wt (!) 137.3 kg   SpO2 93%   BMI 41.05 kg/m   Intake/Output Summary (Last 24 hours) at 12/23/2019 1500 Last data filed at 12/23/2019 1239 Gross per 24 hour  Intake --  Output 775 ml  Net -775 ml   Weight change: 0.178 kg  Physical Exam: GEN: wdwn, sitting up in chair, nad EYES: no scleral icterus, eomi CV: S1-S2, RRR, no M/R/G PULM: Clear to auscultation bilaterally, no wheezes/rhonchi/rales/crackles, normal tactile vocal fremitus, unlabored, bilateral chest expansion ABD: Soft, nontender/nondistended SKIN: chronic stasis changes bilateral LE's EXT: firm edema, currently wrapped Neuro: AAOx3, no focal deficits  Imaging: US RENAL  Result Date: 12/21/2019 CLINICAL DATA:  Acute kidney injury, stage 3 chronic kidney disease EXAM: RENAL / URINARY TRACT ULTRASOUND COMPLETE COMPARISON:  12/09/2019 FINDINGS: Right Kidney: Renal measurements: 13.0 x 5.0 x 6.3 cm = volume: 205 mL . Echogenicity within normal limits. No  mass or hydronephrosis visualized. Left Kidney: Renal measurements: 11.7 x 5.1 x 5.3 cm = volume: 164 mL. Echogenicity within normal limits. No mass or hydronephrosis visualized. Bladder: Appears normal for degree of bladder distention. Other: None. IMPRESSION: Normal renal sonogram Electronically Signed   By: Fidela Salisbury MD   On: 12/21/2019 22:50    Labs: BMET Recent Labs  Lab 12/17/19 0345 12/18/19 2694 12/19/19 0313 12/20/19 0350 12/21/19 0345 12/22/19 0405 12/23/19 0241  NA 134* 134* 135 135 134* 134* 133*  K 4.6 4.4 4.6 4.2 3.9 4.1 4.1  CL 89* 88* 88* 88* 86* 87* 85*  CO2 31 34* 33* 33* 34* 30 33*  GLUCOSE 223* 163* 144* 159* 167* 165* 180*  BUN 49* 53* 56* 55* 55* 54* 57*  CREATININE 2.37* 2.66* 2.78* 2.79* 2.90* 2.87* 3.08*  CALCIUM 9.0 9.1 8.9 8.9 8.8* 8.9 8.9   CBC Recent Labs  Lab 12/19/19 0744 12/20/19 0350 12/21/19 0345 12/23/19 0241  WBC 9.8 10.5 11.2* 11.3*  NEUTROABS 7.7 8.2* 9.0* 9.1*  HGB 9.7* 9.5* 9.3* 9.0*  HCT 32.1* 31.4* 30.6* 29.5*  MCV 95.0 93.5 93.0 91.9  PLT 290 280 290 303    Medications:    . amiodarone  200 mg Oral BID   Followed by  . [START ON 12/27/2019] amiodarone  200 mg Oral Daily  . amLODipine  10 mg Oral Daily  . hydrALAZINE  100 mg Oral Q8H  . insulin aspart  0-5 Units Subcutaneous QHS  . insulin aspart  0-9 Units Subcutaneous TID WC  . insulin aspart  2 Units  Subcutaneous TID WC  . insulin glargine  10 Units Subcutaneous Daily  . isosorbide mononitrate  120 mg Oral Daily  . pravastatin  40 mg Oral q1800  . sodium chloride flush  3 mL Intravenous Q12H  . warfarin  0.5 mg Oral ONCE-1600  . Warfarin - Pharmacist Dosing Inpatient   Does not apply [id number]

## 2019-12-23 NOTE — Progress Notes (Signed)
ANTICOAGULATION - Follow Up Consult  Pharmacy Consult for Coumadin Indication: h/o DVT and new afib  Allergies  Allergen Reactions  . Codeine    Vital Signs: Temp: 97.6 F (36.4 C) (07/26 0505) Temp Source: Oral (07/26 0505) BP: 154/57 (07/26 0505) Pulse Rate: 70 (07/26 0505)  Labs: Recent Labs    12/21/19 0345 12/22/19 0405 12/23/19 0241  HGB 9.3*  --  9.0*  HCT 30.6*  --  29.5*  PLT 290  --  303  LABPROT 31.2* 27.7* 29.2*  INR 3.1* 2.7* 2.9*  CREATININE 2.90* 2.87* 3.08*    Estimated Creatinine Clearance: 34.3 mL/min (A) (by C-G formula based on SCr of 3.08 mg/dL (H)).  Medical History: Past Medical History:  Diagnosis Date  . Epilepsy, grand mal ([place])   . Frozen shoulder    Right Shoulder  . Lumbago   . Peripheral vascular disease ([place])   . Stroke [facility])    Assessment: 64 y.o. M presented from [facility] on 7/17 with history of DVT and new onset atrial fibrillation. Patient on Coumadin PTA with home dose of 4 mg daily. Admission INR at [facility] 2.6. Patient was started on amiodarone during this admission which may potentiate the effects of warfarin.   INR within goal today at 2.9. No s/sx bleeding noted. PO intake variable, H/H low but stable. Albumin level low at 2.7.    Goal of Therapy:  INR 2-3 Monitor platelets by anticoagulation protocol: Yes   Plan:  Warfarin 0.5mg  tonight Monitor INR, CBC and s/s of bleeding daily   Cristela Felt, PharmD Clinical Pharmacist

## 2019-12-23 NOTE — Progress Notes (Signed)
Orthopedic Tech Progress Note Patient Details:  Matthew Gutierrez 08/27/1955 [id number]  Ortho Devices Type of Ortho Device: Haematologist Ortho Device/Splint Location: BLE Ortho Device/Splint Interventions: Ordered   Post Interventions Patient Tolerated: Well Instructions Provided: Care of device   Janit Pagan 12/23/2019, 12:41 PM

## 2019-12-23 NOTE — Progress Notes (Signed)
PROGRESS NOTE    Matthew Gutierrez  ION:[id number] DOB: Jul 31, 1955 DOA: 12/14/2019 PCP: Lillard Anes, MD   Brief Narrative:   Patient is a 64 year old male with history of DVT on Coumadin, obesity, lymphedema, peripheral vascular disease, epilepsy, CKD stage IIIb, hypertension who presented to the [facility] for theevaluationof increased swelling and redness of the left lower extremity. On presentation he was found to be hypertensive. Chest x-ray showed prominent interstitial marking reflecting volume overload or atypical infection. Patient was started on IV clindamycin for cellulitis of left lower extremity. His kidney function deteriorated reaching creatinine of 4.8. Antibiotics discontinued. Patient was transferred to [facility] for possible nephrology consultation. Renal function began to improve prior to transfer.His baseline creatinine is around 1.8. Normal ultrasound of the kidneys. He is on warfarin for anticoagulation. He has history of COPD and uses 2 L of oxygen per minute at baseline. He takes phenytoin for epilepsy. He is currently not on any antibiotics for Cellulitis which has resolved. Cardiology consulted and following. Hospital course has been remarkable for multiple episodes of A. fib with RVR. On amiodarone drip. EP being consulted. PT/OT recommended skilled nursing facility on discharge.  7/26: Appreciate cards and nephro assistance. Baseline O2 use likely 2L continuous at this point. Mobilize as able. Diuresis to start again tomorrow? INR holding at 2.9.   Assessment & Plan:   Principal Problem:   Acute renal failure superimposed on stage 3 chronic kidney disease (HCC) Active Problems:   Essential hypertension   History of DVT (deep vein thrombosis)   Absence epileptic syndrome, not intractable, without status epilepticus (HCC)   COPD (chronic obstructive pulmonary disease) (HCC)   Chronic respiratory failure with hypoxia (HCC)   Cellulitis of leg    Atrial fibrillation with RVR (HCC)  A. fib with RVR  - Multiple episodes of A. fib with RVR not responding to AV nodal blocking agents so started on amiodarone drip drip. - Cardiology onboard. EP consulted - CHADS-VASc is 4 (CVA x2, DM, HTN), he was already on warfarin d/t hx of DVT . - appreciate all consultants assistance - continuing PO amiodarone -7/26: Tolerating current regimen. Continue  AKI on CKD stage IIIa  - Baseline creatinine around 1.8.  - Developed AKI with creatinine peak at 4.8 at [place] at which point antibiotics stopped and IV fluids were given.  - Question if this could be secondary to hemodynamic compromise from rapid ventricular rate versus cardiorenal syndrome from volume overload, - Normal ultrasound of the kidneys. - appreciate cards/nephro assistance -7/26: Scr up to 3 today. Possibly starting torsemide tomorrow. Follow  Acute on chronic diastolic CHF Pericardial effusion -Echo 7/18:Left Ventricle: Poor acoustic windows limit study Cannot evaluate regional wall motion. Left ventricular ejection fraction, by estimation, is 60 to 65%. The left ventricle has normal function. Left ventricular endocardial border not optimally defined to evaluate regional wall motion. The left ventricular internal cavity size was mildly dilated. There is no left ventricular hypertrophy. Left  ventricular diastolic function could not be evaluated. Pericardium: Pericardial effusion is mainly posterior to LV 14 mm maximal dimenstion. No evid of filling compromise. A moderately sized pericardial effusion is present - Coreg was stopped at [facility] due to bradycardia.  - Elevated BNP in the range of 200s.     - diuresis held d/t elevated SCr -7/26: Scr up to 3 today. Possibly starting torsemide tomorrow. Follow  COPD/chronic hypoxic respiratory failure - Uses 2 L of oxygen at baseline mainly during  sleep. - Currently not  in exacerbation. - Continue supplemental oxygen, as needed bronchodilators. - His new baseline use will be 2L [place] continuous.   Hypertension - hydralazine, lasix, amlodipine, imdur -7/26: BP has done better this afternoon  Epilepsy - Continue phenytoin  Type 2 diabetes mellitus - home meds: metformin -On SSIand lantus. - A1c of 8.3 - DM diet -7/26: increase insulin to 12 units  Lower extremity lymphedema, venous insufficiency/cellulitis - He was treated with antibiotics at [facility]. - Currently off antibiotics.  - Followed by wound care and recommended Unna boots. - Continue compression stockings/dressings on lower extremities.  -leukocytosis resolved; continue unna boots  Debility/deconditioning - Complains of severe generalized weakness and is unable to ambulate. - Very frustrated due to his current condition. - PT/OT recommended skilled nursing facility on discharge.  Couagulopathy - pharmacy dosing coumadin - several drug-drug interactions at play here - no frank bleed noted -INRstable at 2.9; follow  DVT prophylaxis: coumadin Code Status: FULL Family Communication: None at bedside   Status is: Inpatient  Not inpatient appropriate, will call [facility] team and downgrade to OBS.   Dispo: The patient is from: Home              Anticipated d/c is to: SNF              Anticipated d/c date is: > 3 days              Patient currently is not medically stable to d/c.  Consultants:   Cardiology  Nephrology  ROS:  Denies CP, N, V, ab pain . Remainder 10-pt ROS is negative for all not previously mentioned.  Subjective: "I just have to be motivated."  Objective: Vitals:   12/23/19 0505 12/23/19 0744 12/23/19 1132 12/23/19 1451  BP: (!) 154/57 (!) 170/86 (!) 146/80 (!) 123/63  Pulse: 70 66 64   Resp: 23 (!) 25 21   Temp: 97.6 F  (36.4 C) (!) 97.4 F (36.3 C) (!) 97.4 F (36.3 C)   TempSrc: Oral Oral Oral   SpO2: 97% 96% 93%   Weight:      Height:        Intake/Output Summary (Last 24 hours) at 12/23/2019 1540 Last data filed at 12/23/2019 1239 Gross per 24 hour  Intake --  Output 775 ml  Net -775 ml   Filed Weights   12/21/19 0404 12/22/19 0500 12/23/19 0500  Weight: (!) 138.3 kg (!) 137.1 kg (!) 137.3 kg    Examination:  General: 64 y.o. male resting in bed in NAD Cardiovascular: RRR, +S1, S2, no m/g/r, equal pulses throughout Respiratory: CTABL, no w/r/r, normal WOB, on 2L [place] GI: BS+, NDNT, no masses noted, no organomegaly noted MSK: No c/c; BLE chronic edema Neuro: A&O x 3, no focal deficits Psyc: Appropriate interaction and affect, calm/cooperative   Data Reviewed: I have personally reviewed following labs and imaging studies.  CBC: Recent Labs  Lab 12/18/19 0443 12/19/19 0744 12/20/19 0350 12/21/19 0345 12/23/19 0241  WBC 11.3* 9.8 10.5 11.2* 11.3*  NEUTROABS 9.0* 7.7 8.2* 9.0* 9.1*  HGB 9.8* 9.7* 9.5* 9.3* 9.0*  HCT 32.5* 32.1* 31.4* 30.6* 29.5*  MCV 96.7 95.0 93.5 93.0 91.9  PLT 296 290 280 290 416   Basic Metabolic Panel: Recent Labs  Lab 12/19/19 0313 12/20/19 0350 12/21/19 0345 12/22/19 0405 12/23/19 0241  NA 135 135 134* 134* 133*  K 4.6 4.2 3.9 4.1 4.1  CL 88* 88* 86* 87* 85*  CO2 33* 33* 34* 30 33*  GLUCOSE 144* 159* 167* 165* 180*  BUN 56* 55* 55* 54* 57*  CREATININE 2.78* 2.79* 2.90* 2.87* 3.08*  CALCIUM 8.9 8.9 8.8* 8.9 8.9  MG  --  1.3* 1.5*  --  1.4*   GFR: Estimated Creatinine Clearance: 34.3 mL/min (A) (by C-G formula based on SCr of 3.08 mg/dL (H)). Liver Function Tests: Recent Labs  Lab 12/22/19 0832 12/23/19 0241  ALBUMIN 2.7* 2.7*   No results for input(s): LIPASE, AMYLASE in the last 168 hours. No results for input(s): AMMONIA in the last 168 hours. Coagulation Profile: Recent Labs  Lab 12/19/19 0313 12/20/19 0350 12/21/19 0345  12/22/19 0405 12/23/19 0241  INR 4.0* 3.4* 3.1* 2.7* 2.9*   Cardiac Enzymes: No results for input(s): CKTOTAL, CKMB, CKMBINDEX, TROPONINI in the last 168 hours. BNP (last 3 results) No results for input(s): PROBNP in the last 8760 hours. HbA1C: No results for input(s): HGBA1C in the last 72 hours. CBG: Recent Labs  Lab 12/22/19 1153 12/22/19 1546 12/22/19 2115 12/23/19 0620 12/23/19 1127  GLUCAP 203* 187* 200* 177* 189*   Lipid Profile: No results for input(s): CHOL, HDL, LDLCALC, TRIG, CHOLHDL, LDLDIRECT in the last 72 hours. Thyroid Function Tests: No results for input(s): TSH, T4TOTAL, FREET4, T3FREE, THYROIDAB in the last 72 hours. Anemia Panel: No results for input(s): VITAMINB12, FOLATE, FERRITIN, TIBC, IRON, RETICCTPCT in the last 72 hours. Sepsis Labs: No results for input(s): PROCALCITON, LATICACIDVEN in the last 168 hours.  Recent Results (from the past 240 hour(s))  SARS Coronavirus 2 by RT PCR (hospital order, performed in [facility] hospital lab) Nasopharyngeal Nasopharyngeal Swab     Status: None   Collection Time: 12/14/19  2:34 AM   Specimen: Nasopharyngeal Swab  Result Value Ref Range Status   SARS Coronavirus 2 NEGATIVE NEGATIVE Final    Comment: (NOTE) SARS-CoV-2 target nucleic acids are NOT DETECTED.  The SARS-CoV-2 RNA is generally detectable in upper and lower respiratory specimens during the acute phase of infection. The lowest concentration of SARS-CoV-2 viral copies this assay can detect is 250 copies / mL. A negative result does not preclude SARS-CoV-2 infection and should not be used as the sole basis for treatment or other patient management decisions.  A negative result may occur with improper specimen collection / handling, submission of specimen other than nasopharyngeal swab, presence of viral mutation(s) within the areas targeted by this assay, and inadequate number of viral copies (<250 copies / mL). A negative result must be combined  with clinical observations, patient history, and epidemiological information.  Fact Sheet for Patients:   StrictlyIdeas.no  Fact Sheet for Healthcare Providers: BankingDealers.co.za  This test is not yet approved or  cleared by the [place] FDA and has been authorized for detection and/or diagnosis of SARS-CoV-2 by FDA under an Emergency Use Authorization (EUA).  This EUA will remain in effect (meaning this test can be used) for the duration of the COVID-19 declaration under Section 564(b)(1) of the Act, 21 U.S.C. section 360bbb-3(b)(1), unless the authorization is terminated or revoked sooner.  Performed at [facility], [place] [address], [place], [place] [address]   SARS CORONAVIRUS 2 (TAT 6-24 HRS) Nasopharyngeal Nasopharyngeal Swab     Status: None   Collection Time: 12/19/19  6:00 PM   Specimen: Nasopharyngeal Swab  Result Value Ref Range Status   SARS Coronavirus 2 NEGATIVE NEGATIVE Final    Comment: (NOTE) SARS-CoV-2 target nucleic acids are NOT DETECTED.  The SARS-CoV-2 RNA is generally detectable in upper and  lower respiratory specimens during the acute phase of infection. Negative results do not preclude SARS-CoV-2 infection, do not rule out co-infections with other pathogens, and should not be used as the sole basis for treatment or other patient management decisions. Negative results must be combined with clinical observations, patient history, and epidemiological information. The expected result is Negative.  Fact Sheet for Patients: SugarRoll.be  Fact Sheet for Healthcare Providers: https://www.woods-mathews.com/  This test is not yet approved or cleared by the [place] FDA and  has been authorized for detection and/or diagnosis of SARS-CoV-2 by FDA under an Emergency Use Authorization (EUA). This EUA will remain  in effect (meaning this test can be used) for  the duration of the COVID-19 declaration under Se ction 564(b)(1) of the Act, 21 U.S.C. section 360bbb-3(b)(1), unless the authorization is terminated or revoked sooner.  Performed at [facility], [place] [address] [place], [place] [address]       Radiology Studies: US RENAL  Result Date: 12/21/2019 CLINICAL DATA:  Acute kidney injury, stage 3 chronic kidney disease EXAM: RENAL / URINARY TRACT ULTRASOUND COMPLETE COMPARISON:  12/09/2019 FINDINGS: Right Kidney: Renal measurements: 13.0 x 5.0 x 6.3 cm = volume: 205 mL . Echogenicity within normal limits. No mass or hydronephrosis visualized. Left Kidney: Renal measurements: 11.7 x 5.1 x 5.3 cm = volume: 164 mL. Echogenicity within normal limits. No mass or hydronephrosis visualized. Bladder: Appears normal for degree of bladder distention. Other: None. IMPRESSION: Normal renal sonogram Electronically Signed   By: Fidela Salisbury MD   On: 12/21/2019 22:50     Scheduled Meds: . amiodarone  200 mg Oral BID   Followed by  . [START ON 12/27/2019] amiodarone  200 mg Oral Daily  . amLODipine  10 mg Oral Daily  . hydrALAZINE  100 mg Oral Q8H  . insulin aspart  0-5 Units Subcutaneous QHS  . insulin aspart  0-9 Units Subcutaneous TID WC  . insulin aspart  2 Units Subcutaneous TID WC  . insulin glargine  10 Units Subcutaneous Daily  . isosorbide mononitrate  120 mg Oral Daily  . pravastatin  40 mg Oral q1800  . sodium chloride flush  3 mL Intravenous Q12H  . warfarin  0.5 mg Oral ONCE-1600  . Warfarin - Pharmacist Dosing Inpatient   Does not apply q1600   Continuous Infusions: . sodium chloride       LOS: 9 days    Time spent: 25 minutes spent in the coordination of care today.    Jonnie Finner, DO [facility]  If 7PM-7AM, please contact night-coverage [email or web address] 12/23/2019, 3:40 PM

## 2019-12-23 NOTE — Progress Notes (Signed)
Occupational Therapy Treatment Patient Details Name: Matthew Gutierrez MRN: [id number] DOB: 10-28-1955 Today's Date: 12/23/2019    History of present illness Pt is a 64 y.o. male admitted 12/14/[facility] from [facility] with BLE cellulitis. Workup for AKI on CKD 3, acute diastolic CHF, paraoxysmal afib with RVR. Ultrasound (-) DVT. PMH includes DVT on warfarin, obesity, lymphedema, PVD, epilepsy, CKD, HTN.   OT comments  Pt making slow progress towards OT goals, but is agreeable to therapy session today. He continues to have limitations due to overall weakness and deconditioning. Pt tolerating functional transfers using RW with minA, limited BSC to EOB given fatigue. Pt completing additional ADL seated EOB and UB/LB exercise for continued strengthenign/endurance within pt limits. VSS throughout. Feel SNF recommendation remains appropriate at this time. Will continue to follow acutely.   Follow Up Recommendations  SNF    Equipment Recommendations  None recommended by OT          Precautions / Restrictions Precautions Precautions: Fall;Other (comment) Precaution Comments: SOB with min activity Restrictions Weight Bearing Restrictions: No       Mobility Bed Mobility Overal bed mobility: Needs Assistance             General bed mobility comments: pt returning to supine with NT while OT out of room gathering additional items   Transfers Overall transfer level: Needs assistance Equipment used: Rolling walker (2 wheeled) Transfers: Sit to/from [facility] Sit to Stand: Min assist Stand pivot transfers: Min assist       General transfer comment: boosting assist to rise to standing, cues for safe hand placement, increased time to acheive full upright, steadying assist with transfer BSC to EOB, multimodal cues for pt process instruction for side stepping towards [facility]     Balance Overall balance assessment: Needs assistance Sitting-balance support: Feet  supported;No upper extremity supported Sitting balance-Leahy Scale: Fair Sitting balance - Comments: supervision for static balance EOB   Standing balance support: During functional activity;Bilateral upper extremity supported Standing balance-Leahy Scale: Poor Standing balance comment: Reliant on UE support                           ADL either performed or assessed with clinical judgement   ADL Overall ADL's : Needs assistance/impaired     Grooming: Wash/dry face;Oral care;Supervision/safety;Set up;Sitting Grooming Details (indicate cue type and reason): seated EOB                 Toilet Transfer: Minimal assistance;+2 for safety/equipment;Stand-pivot;BSC Toilet Transfer Details (indicate cue type and reason): assisting back to bed from [facility] during session, pt on BSC upon arrival Toileting- Clothing Manipulation and Hygiene: Maximal assistance;Sitting/lateral lean;Sit to/from stand;+2 for physical assistance Toileting - Clothing Manipulation Details (indicate cue type and reason): NT assisting with pericare in standing while OT proving assist for standing balance      Functional mobility during ADLs: Minimal assistance;+2 for safety/equipment;Rolling walker General ADL Comments: pt continues to have decreased endurance, overall weakness      Vision       Perception     Praxis      Cognition Arousal/Alertness: Awake/alert Behavior During Therapy: WFL for tasks assessed/performed;Flat affect Overall Cognitive Status: No family/caregiver present to determine baseline cognitive functioning                       Memory: Decreased short-term memory   Safety/Judgement: Decreased awareness of deficits Awareness: Emergent Problem Solving: Requires verbal  cues;Requires tactile cues General Comments: pt continues to appear down regarding current status, medical decline         Exercises Exercises: General Lower Extremity;General Upper  Extremity General Exercises - Upper Extremity Shoulder Horizontal ABduction: AROM;Both;20 reps;Seated;Theraband (10 reps, x2 sets ) Theraband Level (Shoulder Horizontal Abduction): Level 1 (Yellow) General Exercises - Lower Extremity Long Arc Quad: AROM;Both;10 reps;Seated   Shoulder Instructions       General Comments VSS, SpO2 >/=92% on supplemental O2     Pertinent Vitals/ Pain       Pain Assessment: Faces Faces Pain Scale: Hurts little more Pain Location: neck (from laying/sleeping positions) Pain Descriptors / Indicators: Discomfort;Sore Pain Intervention(s): Monitored during session;Repositioned  Home Living                                          Prior Functioning/Environment              Frequency  Min 2X/week        Progress Toward Goals  OT Goals(current goals can now be found in the care plan section)  Progress towards OT goals: Progressing toward goals  Acute Rehab OT Goals Patient Stated Goal: "I'm not sure if I can go home like this, but I want to go home" OT Goal Formulation: With patient Time For Goal Achievement: 12/29/19 Potential to Achieve Goals: Good ADL Goals Pt Will Perform Grooming: with supervision;sitting;standing Pt Will Perform Lower Body Dressing: with supervision;sit to/from stand;sitting/lateral leans;with adaptive equipment Pt Will Transfer to Toilet: with supervision;bedside commode;ambulating Pt Will Perform Toileting - Clothing Manipulation and hygiene: with supervision;sit to/from stand;sitting/lateral leans  Plan Discharge plan remains appropriate    Co-evaluation                 AM-PAC OT "6 Clicks" Daily Activity     Outcome Measure   Help from another person eating meals?: None Help from another person taking care of personal grooming?: A Little Help from another person toileting, which includes using toliet, bedpan, or urinal?: A Lot Help from another person bathing (including washing,  rinsing, drying)?: A Lot Help from another person to put on and taking off regular upper body clothing?: A Little Help from another person to put on and taking off regular lower body clothing?: A Lot 6 Click Score: 16    End of Session Equipment Utilized During Treatment: Oxygen;Rolling walker  OT Visit Diagnosis: Other abnormalities of gait and mobility (R26.89);Muscle weakness (generalized) (M62.81);Pain Pain - part of body:  (neck)   Activity Tolerance Patient tolerated treatment well;Patient limited by fatigue   Patient Left in bed;with call bell/phone within reach;with bed alarm set   Nurse Communication Mobility status        Time: 8882-8003 OT Time Calculation (min): 24 min  Charges: OT General Charges $OT Visit: 1 Visit OT Treatments $Self Care/Home Management : 23-37 mins  Lou Cal, OT Acute Rehabilitation Services Pager [phone] Office [place] 12/23/2019, 11:09 AM

## 2019-12-23 NOTE — Progress Notes (Signed)
Progress Note  Patient Name: Matthew Gutierrez Date of Encounter: 12/23/2019  [facility] Cardiologist: Jenne Campus, MD   Subjective   Sitting on edge of bed. Feels like breathing is ok sitting up, but feels very short of breath lying flat. Legs just re-wrapped. No chest pain.   Inpatient Medications    Scheduled Meds: . amiodarone  200 mg Oral BID   Followed by  . [START ON 12/27/2019] amiodarone  200 mg Oral Daily  . amLODipine  10 mg Oral Daily  . hydrALAZINE  100 mg Oral Q8H  . insulin aspart  0-5 Units Subcutaneous QHS  . insulin aspart  0-9 Units Subcutaneous TID WC  . insulin aspart  2 Units Subcutaneous TID WC  . insulin glargine  10 Units Subcutaneous Daily  . isosorbide mononitrate  120 mg Oral Daily  . pravastatin  40 mg Oral q1800  . sodium chloride flush  3 mL Intravenous Q12H  . warfarin  0.5 mg Oral ONCE-1600  . Warfarin - Pharmacist Dosing Inpatient   Does not apply q1600   Continuous Infusions: . sodium chloride     PRN Meds: sodium chloride, acetaminophen **OR** acetaminophen, albuterol, clonazepam, ondansetron **OR** ondansetron (ZOFRAN) IV, oxyCODONE-acetaminophen, senna-docusate, sodium chloride flush   Vital Signs    Vitals:   12/23/19 0500 12/23/19 0505 12/23/19 0744 12/23/19 1132  BP:  (!) 154/57 (!) 170/86 (!) 146/80  Pulse:  70 66 64  Resp:  23 (!) 25 21  Temp:  97.6 F (36.4 C) (!) 97.4 F (36.3 C) (!) 97.4 F (36.3 C)  TempSrc:  Oral Oral Oral  SpO2:  97% 96% 93%  Weight: (!) 137.3 kg     Height:        Intake/Output Summary (Last 24 hours) at 12/23/2019 1257 Last data filed at 12/23/2019 1239 Gross per 24 hour  Intake --  Output 775 ml  Net -775 ml   Last 3 Weights 12/23/2019 12/22/2019 12/21/2019  Weight (lbs) 302 lb 11.1 oz 302 lb 4.8 oz 304 lb 14.3 oz  Weight (kg) 137.3 kg 137.122 kg 138.3 kg      Telemetry    NSR- Personally Reviewed  ECG    ECG today sinus rhythm, long QT - Personally Reviewed  Physical Exam    GEN: Well nourished, well developed in no acute distress. Sitting on edge of bed, breathing comfortably, nasal cannula in place NECK: No JVD visualized, though difficult body habitus CARDIAC: regular rhythm, normal S1 and S2, no rubs or gallops. No murmur. VASCULAR: Radial pulses 2+ bilaterally.  RESPIRATORY:  Distant breath sounds. Faint crackles at bases, improves with deep inspiration ABDOMEN: Soft, non-tender, non-distended MUSCULOSKELETAL:  Moves all 4 limbs independently SKIN: Bilateral LE chronic venous stasis changes, wrapped in Unna boots. Firm, nonpitting 2-3+ edema. NEUROLOGIC:  No focal neuro deficits noted. PSYCHIATRIC:  Normal affect   Labs    High Sensitivity Troponin:  No results for input(s): TROPONINIHS in the last 720 hours.    Chemistry Recent Labs  Lab 12/21/19 0345 12/22/19 0405 12/22/19 0832 12/23/19 0241  NA 134* 134*  --  133*  K 3.9 4.1  --  4.1  CL 86* 87*  --  85*  CO2 34* 30  --  33*  GLUCOSE 167* 165*  --  180*  BUN 55* 54*  --  57*  CREATININE 2.90* 2.87*  --  3.08*  CALCIUM 8.8* 8.9  --  8.9  ALBUMIN  --   --  2.7* 2.7*  GFRNONAA 22* 22*  --  20*  GFRAA 25* 25*  --  23*  ANIONGAP 14 17*  --  15     Hematology Recent Labs  Lab 12/20/19 0350 12/21/19 0345 12/23/19 0241  WBC 10.5 11.2* 11.3*  RBC 3.36* 3.29* 3.21*  HGB 9.5* 9.3* 9.0*  HCT 31.4* 30.6* 29.5*  MCV 93.5 93.0 91.9  MCH 28.3 28.3 28.0  MCHC 30.3 30.4 30.5  RDW 14.6 14.6 14.7  PLT 280 290 303    BNP No results for input(s): BNP, PROBNP in the last 168 hours.   DDimer No results for input(s): DDIMER in the last 168 hours.   Radiology    US RENAL  Result Date: 12/21/2019 CLINICAL DATA:  Acute kidney injury, stage 3 chronic kidney disease EXAM: RENAL / URINARY TRACT ULTRASOUND COMPLETE COMPARISON:  12/09/2019 FINDINGS: Right Kidney: Renal measurements: 13.0 x 5.0 x 6.3 cm = volume: 205 mL . Echogenicity within normal limits. No mass or hydronephrosis visualized.  Left Kidney: Renal measurements: 11.7 x 5.1 x 5.3 cm = volume: 164 mL. Echogenicity within normal limits. No mass or hydronephrosis visualized. Bladder: Appears normal for degree of bladder distention. Other: None. IMPRESSION: Normal renal sonogram Electronically Signed   By: Fidela Salisbury MD   On: 12/21/2019 22:50    Cardiac Studies   2D Echocardiogram7.18.2021  1. Poor acoustic windows limit study Left ventricular ejection fraction,  by estimation, is 60 to 65%. The left ventricle has normal function. Left  ventricular endocardial border not optimally defined to evaluate regional  wall motion. The left  ventricular internal cavity size was mildly dilated. Left ventricular  diastolic function could not be evaluated.  2. Right ventricular systolic function is grossly normal. The right  ventricular size is normal.  3. Pericardial effusion is mainly posterior to LV 14 mm maximal  dimenstion. No evid of filling compromise. . Moderate pericardial  effusion.  4. The mitral valve is normal in structure. No evidence of mitral valve  regurgitation.  5. The aortic valve is tricuspid. Aortic valve regurgitation is not  visualized. Mild aortic valve sclerosis is present, with no evidence of  aortic valve stenosis.  6. The inferior vena cava is dilated in size with >50% respiratory  variability, suggesting right atrial pressure of 8 mmHg.   Patient Profile     64 y.o. male with a hx of HTN, HLD, DM, COPDon home O2, CKD III, CVA, DVTwho is being seen for the evaluation ofparoxysmalatrial fibrillation with RVRat the request of Amrit, Adhikari, MD.  Assessment & Plan    Paroxysmal atrial fibrillation, with RVR: -continue oral amiodarone loading. QT is long, avoid other QT prolonging medications -Anticoagulated with Coumadin, appreciate pharmacy dosing. INR 2.9 today.  Acute diastolic CHF: -Echocardiogram with LVEF of 50 to 65% -Had been receiving Lasix 40 mg BID, with  metolazone, without clinical improvement -admission weight 142.4 kg, weight today 137.3 kg -holding diuretics, but Cr slightly worse today -fine crackles at bases, atelectasis vs. Fluid, will monitor. Symptoms concerning for orthopnea -ideally would consider RHC, but this is complex on coumadin. -we are in a difficult position. May need to consider another dose of diuretic tomorrow. Uncertain how we can maintain this balance between renal function and fluid as an outpatient, will need close monitoring.  Hypertension: -remains labile, range 116/69-170/86 in recent readings -limited options given renal function. No ACEi/ARB/ARNI/MRA -had bradycardia on carvedilol, would be cautious/avoid beta blocker. -Continue amlodipine 10 mg daily, hydralazine 100 mg 3 times daily -increased  imdur to 120 mg daily starting 7/24 -not ideal, but if cannot get significant improvement, may need to consider clonidine.  Acute on chronic CKD stage III: -Creatinine up today despite holding diuretics -Holding metolazone and lasix, as above. -appreciate nephrology assistance  No change today: Demand ischemia/elevated troponin: -Troponin level at outside hospital with a peak of 0.13 in the setting of RVR and acute on chronic CKD stage III with high suspicion for demand ischemia -Echocardiogram with normal LV function -Plan at this time is to defer ischemic work-up in the outpatient setting given that he is not an ideal candidate for nuclear stress testing given body habitus and not a catheterization or coronary CT candidate secondary to CKD -No ASA in the setting of Coumadin  HLD: -Last LDL 60 -Continue pravastatin  For questions or updates, please contact [place] Please consult [email or web address] for contact info under     Signed, Buford Dresser, MD  12/23/2019, 12:57 PM

## 2019-12-24 DIAGNOSIS — J449 Chronic obstructive pulmonary disease, unspecified: Secondary | ICD-10-CM | POA: Diagnosis not present

## 2019-12-24 DIAGNOSIS — L03119 Cellulitis of unspecified part of limb: Secondary | ICD-10-CM | POA: Diagnosis not present

## 2019-12-24 DIAGNOSIS — I4891 Unspecified atrial fibrillation: Secondary | ICD-10-CM | POA: Diagnosis not present

## 2019-12-24 LAB — GLUCOSE, CAPILLARY
Glucose-Capillary: 178 mg/dL — ABNORMAL HIGH (ref 70–99)
Glucose-Capillary: 155 mg/dL — ABNORMAL HIGH (ref 70–99)
Glucose-Capillary: 179 mg/dL — ABNORMAL HIGH (ref 70–99)
Glucose-Capillary: 173 mg/dL — ABNORMAL HIGH (ref 70–99)
Glucose-Capillary: 188 mg/dL — ABNORMAL HIGH (ref 70–99)
Glucose-Capillary: 165 mg/dL — ABNORMAL HIGH (ref 70–99)

## 2019-12-24 LAB — CBC
HCT: 29.1 % — ABNORMAL LOW (ref 39.0–52.0)
Hemoglobin: 9 g/dL — ABNORMAL LOW (ref 13.0–17.0)
MCH: 28.8 pg (ref 26.0–34.0)
MCHC: 30.9 g/dL (ref 30.0–36.0)
MCV: 93 fL (ref 80.0–100.0)
Platelets: 300 10*3/uL (ref 150–400)
RBC: 3.13 MIL/uL — ABNORMAL LOW (ref 4.22–5.81)
RDW: 14.9 % (ref 11.5–15.5)
WBC: 11.8 10*3/uL — ABNORMAL HIGH (ref 4.0–10.5)
nRBC: 0 % (ref 0.0–0.2)

## 2019-12-24 LAB — BASIC METABOLIC PANEL
Anion gap: 15 (ref 5–15)
BUN: 57 mg/dL — ABNORMAL HIGH (ref 8–23)
CO2: 32 mmol/L (ref 22–32)
Calcium: 8.7 mg/dL — ABNORMAL LOW (ref 8.9–10.3)
Chloride: 86 mmol/L — ABNORMAL LOW (ref 98–111)
Creatinine, Ser: 2.94 mg/dL — ABNORMAL HIGH (ref 0.61–1.24)
GFR calc Af Amer: 25 mL/min — ABNORMAL LOW (ref >60)
GFR calc non Af Amer: 21 mL/min — ABNORMAL LOW (ref >60)
Glucose, Bld: 225 mg/dL — ABNORMAL HIGH (ref 70–99)
Potassium: 3.8 mmol/L (ref 3.5–5.1)
Sodium: 133 mmol/L — ABNORMAL LOW (ref 135–145)

## 2019-12-24 LAB — ALBUMIN: Albumin: 2.7 g/dL — ABNORMAL LOW (ref 3.5–5.0)

## 2019-12-24 LAB — MAGNESIUM: Magnesium: 1.7 mg/dL (ref 1.7–2.4)

## 2019-12-24 LAB — PROTIME-INR
INR: 2.9 — ABNORMAL HIGH (ref 0.8–1.2)
Prothrombin Time: 29 seconds — ABNORMAL HIGH (ref 11.4–15.2)

## 2019-12-24 LAB — PHENYTOIN LEVEL, TOTAL: Phenytoin Lvl: 14.4 ug/mL (ref 10.0–20.0)

## 2019-12-24 MED ORDER — WARFARIN 0.5 MG HALF TABLET
0.5000 mg | ORAL_TABLET | Freq: Once | ORAL | Status: AC
  Administered 2019-12-24: 0.5 mg via ORAL
  Filled 2019-12-24: qty 1

## 2019-12-24 MED ORDER — TORSEMIDE 20 MG PO TABS
60.0000 mg | ORAL_TABLET | Freq: Every day | ORAL | Status: DC
  Administered 2019-12-24 – 2019-12-27 (×4): 60 mg via ORAL
  Filled 2019-12-24 (×4): qty 3

## 2019-12-24 NOTE — Progress Notes (Signed)
  [facility] Progress Note    Assessment/ Plan:   1. AKI on CKD3 1. Likely related to diuretics, cardiorenal syndrome, was taking ACE inhibitor at presentation 2. Nonoliguric 3. Resume diuretics today, torsemide 60 mg daily 4. No indication for dialysis 2. Acute on chronic HFpEF, improved but not resolved, cardiology following 3. Hypertension, stable 4. Chronic lymphedema, legs wrapped   Daily weights, Daily Renal Panel, Strict I/Os, Avoid nephrotoxins (NSAIDs, judicious IV Contrast)   [facility] 12/22/2019, 2:14 PM   Subjective:    Complains of dyspnea/orthopnea, on room air and nasal cannula at times  Creatinine slightly improved, electrolytes are stable no change in weight  Objective:   BP (!) 157/77 (BP Location: Left Arm)   Pulse 66   Temp 97.6 F (36.4 C) (Oral)   Resp 15   Ht 6' (1.829 m)   Wt (!) 137.3 kg   SpO2 93%   BMI 41.05 kg/m   Intake/Output Summary (Last 24 hours) at 12/24/2019 1310 Last data filed at 12/24/2019 1203 Gross per 24 hour  Intake 840 ml  Output 550 ml  Net 290 ml   Weight change:   Physical Exam: GEN: wdwn, lying in bed, obese, NAD EYES: no scleral icterus, eomi CV: S1-S2, RRR, no M/R/G PULM: Clear to auscultation bilaterally, no crackles ABD: Soft, nontender/nondistended SKIN: chronic stasis changes bilateral LE's EXT: firm edema, currently wrapped Neuro: AAOx3, no focal deficits  Imaging: No results found.  Labs: BMET Recent Labs  Lab 12/18/19 0443 12/19/19 0313 12/20/19 0350 12/21/19 0345 12/22/19 0405 12/23/19 0241 12/24/19 0257  NA 134* 135 135 134* 134* 133* 133*  K 4.4 4.6 4.2 3.9 4.1 4.1 3.8  CL 88* 88* 88* 86* 87* 85* 86*  CO2 34* 33* 33* 34* 30 33* 32  GLUCOSE 163* 144* 159* 167* 165* 180* 225*  BUN 53* 56* 55* 55* 54* 57* 57*  CREATININE 2.66* 2.78* 2.79* 2.90* 2.87* 3.08* 2.94*  CALCIUM 9.1 8.9 8.9 8.8* 8.9 8.9 8.7*   CBC Recent Labs  Lab  12/19/19 0744 12/19/19 0744 12/20/19 0350 12/21/19 0345 12/23/19 0241 12/24/19 0257  WBC 9.8   < > 10.5 11.2* 11.3* 11.8*  NEUTROABS 7.7  --  8.2* 9.0* 9.1*  --   HGB 9.7*   < > 9.5* 9.3* 9.0* 9.0*  HCT 32.1*   < > 31.4* 30.6* 29.5* 29.1*  MCV 95.0   < > 93.5 93.0 91.9 93.0  PLT 290   < > 280 290 303 300   < > = values in this interval not displayed.    Medications:    . amiodarone  200 mg Oral BID   Followed by  . [START ON 12/27/2019] amiodarone  200 mg Oral Daily  . amLODipine  10 mg Oral Daily  . hydrALAZINE  100 mg Oral Q8H  . insulin aspart  0-5 Units Subcutaneous QHS  . insulin aspart  0-9 Units Subcutaneous TID WC  . insulin aspart  2 Units Subcutaneous TID WC  . insulin glargine  12 Units Subcutaneous Daily  . isosorbide mononitrate  120 mg Oral Daily  . pravastatin  40 mg Oral q1800  . sodium chloride flush  3 mL Intravenous Q12H  . torsemide  60 mg Oral Daily  . warfarin  0.5 mg Oral ONCE-1600  . Warfarin - Pharmacist Dosing Inpatient   Does not apply [id number]

## 2019-12-24 NOTE — Progress Notes (Signed)
Progress Note  Patient Name: Matthew Gutierrez Date of Encounter: 12/24/2019  [facility] Cardiologist: Jenne Campus, MD   Subjective   Sitting up in bed today. Feels like breathing is stable. No new complaints.  Inpatient Medications    Scheduled Meds: . amiodarone  200 mg Oral BID   Followed by  . [START ON 12/27/2019] amiodarone  200 mg Oral Daily  . amLODipine  10 mg Oral Daily  . hydrALAZINE  100 mg Oral Q8H  . insulin aspart  0-5 Units Subcutaneous QHS  . insulin aspart  0-9 Units Subcutaneous TID WC  . insulin aspart  2 Units Subcutaneous TID WC  . insulin glargine  12 Units Subcutaneous Daily  . isosorbide mononitrate  120 mg Oral Daily  . pravastatin  40 mg Oral q1800  . sodium chloride flush  3 mL Intravenous Q12H  . warfarin  0.5 mg Oral ONCE-1600  . Warfarin - Pharmacist Dosing Inpatient   Does not apply q1600   Continuous Infusions: . sodium chloride     PRN Meds: sodium chloride, acetaminophen **OR** acetaminophen, albuterol, clonazepam, ondansetron **OR** ondansetron (ZOFRAN) IV, oxyCODONE-acetaminophen, senna-docusate, sodium chloride flush   Vital Signs    Vitals:   12/23/19 2011 12/23/19 2346 12/24/19 0505 12/24/19 0738  BP: (!) 148/81 (!) 136/73 128/81 (!) 159/86  Pulse: 67 68 66 63  Resp: 19 17 21 20   Temp: (!) 97.4 F (36.3 C) (!) 97.4 F (36.3 C) (!) 97.5 F (36.4 C) 97.6 F (36.4 C)  TempSrc: Oral Oral Oral Oral  SpO2: 99% 98% 96% 99%  Weight:      Height:        Intake/Output Summary (Last 24 hours) at 12/24/2019 1048 Last data filed at 12/24/2019 0738 Gross per 24 hour  Intake 720 ml  Output 700 ml  Net 20 ml   Last 3 Weights 12/23/2019 12/22/2019 12/21/2019  Weight (lbs) 302 lb 11.1 oz 302 lb 4.8 oz 304 lb 14.3 oz  Weight (kg) 137.3 kg 137.122 kg 138.3 kg      Telemetry    NSR- Personally Reviewed  ECG    ECG 7/26 sinus rhythm, long QT - Personally Reviewed  Physical Exam   GEN: Well nourished, well developed in no  acute distress. Sitting comfortable in bed, [place] in place NECK: No JVD visualized, though difficult body habitus CARDIAC: regular rhythm, normal S1 and S2, no rubs or gallops. No murmur. VASCULAR: Radial pulses 2+ bilaterally.  RESPIRATORY:  Distant breath sounds throughout. Crackles not appreciated today. ABDOMEN: Soft, non-tender, non-distended MUSCULOSKELETAL:  Moves all 4 limbs independently SKIN: Bilateral chronic venous stasis changes, wrapped in Unna boots. Firm, nonpitting 2-3+ edema NEUROLOGIC:  No focal neuro deficits noted. PSYCHIATRIC:  Normal affect   Labs    High Sensitivity Troponin:  No results for input(s): TROPONINIHS in the last 720 hours.    Chemistry Recent Labs  Lab 12/22/19 0405 12/22/19 0832 12/23/19 0241 12/24/19 0257  NA 134*  --  133* 133*  K 4.1  --  4.1 3.8  CL 87*  --  85* 86*  CO2 30  --  33* 32  GLUCOSE 165*  --  180* 225*  BUN 54*  --  57* 57*  CREATININE 2.87*  --  3.08* 2.94*  CALCIUM 8.9  --  8.9 8.7*  ALBUMIN  --  2.7* 2.7* 2.7*  GFRNONAA 22*  --  20* 21*  GFRAA 25*  --  23* 25*  ANIONGAP 17*  --  15  15     Hematology Recent Labs  Lab 12/21/19 0345 12/23/19 0241 12/24/19 0257  WBC 11.2* 11.3* 11.8*  RBC 3.29* 3.21* 3.13*  HGB 9.3* 9.0* 9.0*  HCT 30.6* 29.5* 29.1*  MCV 93.0 91.9 93.0  MCH 28.3 28.0 28.8  MCHC 30.4 30.5 30.9  RDW 14.6 14.7 14.9  PLT 290 303 300    BNP No results for input(s): BNP, PROBNP in the last 168 hours.   DDimer No results for input(s): DDIMER in the last 168 hours.   Radiology    No results found.  Cardiac Studies   2D Echocardiogram7.18.2021  1. Poor acoustic windows limit study Left ventricular ejection fraction,  by estimation, is 60 to 65%. The left ventricle has normal function. Left  ventricular endocardial border not optimally defined to evaluate regional  wall motion. The left  ventricular internal cavity size was mildly dilated. Left ventricular  diastolic function could not be  evaluated.  2. Right ventricular systolic function is grossly normal. The right  ventricular size is normal.  3. Pericardial effusion is mainly posterior to LV 14 mm maximal  dimenstion. No evid of filling compromise. . Moderate pericardial  effusion.  4. The mitral valve is normal in structure. No evidence of mitral valve  regurgitation.  5. The aortic valve is tricuspid. Aortic valve regurgitation is not  visualized. Mild aortic valve sclerosis is present, with no evidence of  aortic valve stenosis.  6. The inferior vena cava is dilated in size with >50% respiratory  variability, suggesting right atrial pressure of 8 mmHg.   Patient Profile     64 y.o. male with a hx of HTN, HLD, DM, COPDon home O2, CKD III, CVA, DVTwho is being seen for the evaluation ofparoxysmalatrial fibrillation with RVRat the request of Amrit, Adhikari, MD.  Assessment & Plan    Paroxysmal atrial fibrillation, with RVR: -continue oral amiodarone loading. QT is prolonged, avoid other QT prolonging medications -Anticoagulated with Coumadin, appreciate pharmacy dosing. INR 2.9 today.  Acute diastolic CHF: -Echocardiogram with LVEF of 50 to 65% -Had been receiving Lasix 40 mg BID, with metolazone, without clinical improvement but rising creatinine -admission weight 142.4 kg, weight yesterday 137.3 kg, no weight yet today -appreciate nephrology assistance, agree with starting torsemide today.  Hypertension: -remains labile -limited options given renal function. No ACEi/ARB/ARNI/MRA -had bradycardia on carvedilol, would be cautious/avoid beta blocker. -Continue amlodipine 10 mg daily, hydralazine 100 mg 3 times daily -increased imdur to 120 mg daily starting 7/24 -not ideal, but if cannot get significant improvement, may need to consider clonidine. -given acute on chronic CKD, will defer BP target to nephrology. Would suggest not being overaggressive to avoid diminished kidney perfusion.  Acute  on chronic CKD stage III: -Creatinine is downtrending today, though still above baseline -appreciate nephrology assistance  No change today: Demand ischemia/elevated troponin: -Troponin level at outside hospital with a peak of 0.13 in the setting of RVR and acute on chronic CKD stage III with high suspicion for demand ischemia -Echocardiogram with normal LV function -Plan at this time is to defer ischemic work-up in the outpatient setting given that he is not an ideal candidate for nuclear stress testing given body habitus and not a catheterization or coronary CT candidate secondary to CKD -No ASA in the setting of Coumadin  HLD: -Last LDL 60 -Continue pravastatin  [facility] will sign off, as nephrology assisting with diuretics given renal function.  Please do not hesitate to contact us with additional questions. Medication  Recommendations:  Continue blood pressure medications as ordered. Diuretics per nephrology. Other recommendations (labs, testing, etc): none Follow up as an outpatient:  He will need outpatient follow up with Dr. Agustin Cree, likely will need to be arranged after SNF rehab. Has afib clinic follow up on 12/30/19  For questions or updates, please contact [place] Please consult [email or web address] for contact info under     Signed, Buford Dresser, MD  12/24/2019, 10:48 AM

## 2019-12-24 NOTE — Progress Notes (Signed)
PROGRESS NOTE    Matthew Gutierrez  JKD:[id number] DOB: Sep 29, 1955 DOA: 12/14/2019 PCP: Lillard Anes, MD   Brief Narrative:   Patient is a 64 year old male with history of DVT on Coumadin, obesity, lymphedema, peripheral vascular disease, epilepsy, CKD stage IIIb, hypertension who presented to the [facility] for theevaluationof increased swelling and redness of the left lower extremity. On presentation he was found to be hypertensive. Chest x-ray showed prominent interstitial marking reflecting volume overload or atypical infection. Patient was started on IV clindamycin for cellulitis of left lower extremity. His kidney function deteriorated reaching creatinine of 4.8. Antibiotics discontinued. Patient was transferred to [facility] for possible nephrology consultation. Renal function began to improve prior to transfer.His baseline creatinine is around 1.8. Normal ultrasound of the kidneys. He is on warfarin for anticoagulation. He has history of COPD and uses 2 L of oxygen per minute at baseline. He takes phenytoin for epilepsy. He is currently not on any antibiotics for Cellulitis which has resolved. Cardiology consulted and following. Hospital course has been remarkable for multiple episodes of A. fib with RVR. On amiodarone drip. EP being consulted. PT/OT recommended skilled nursing facility on discharge.  7/27: Torsemide started. Continuing amio load. He feels like he has no energy today, but wants to move in the hallways. INR is stable.   Assessment & Plan:  A. fib with RVR  - Multiple episodes of A. fib with RVR not responding to AV nodal blocking agents so started on amiodarone drip drip. - Cardiology onboard. EP consulted - CHADS-VASc is 4 (CVA x2, DM, HTN), he was already on warfarin d/t hx of DVT . - appreciate all consultants assistance - 7/27: Continuing PO amio load, HR ok, follow  AKI on CKD stage IIIa  - Baseline creatinine around 1.8.   - Developed AKI with creatinine peak at 4.8 at [place] at which point antibiotics stopped and IV fluids were given.  - Question if this could be secondary to hemodynamic compromise from rapid ventricular rate versus cardiorenal syndrome from volume overload, - Normal ultrasound of the kidneys. -appreciate cards/nephro assistance -7/27: Scr stable today. Torsemide started. Follow UOP/wts.   Acute on chronic diastolic CHF Pericardial effusion -Echo 7/18:Left Ventricle: Poor acoustic windows limit study Cannot evaluate regional wall motion. Left ventricular ejection fraction, by estimation, is 60 to 65%. The left ventricle has normal function. Left ventricular endocardial border not optimally defined to evaluate regional wall motion. The left ventricular internal cavity size was mildly dilated. There is no left ventricular hypertrophy. Left  ventricular diastolic function could not be evaluated. Pericardium: Pericardial effusion is mainly posterior to LV 14 mm maximal dimenstion. No evid of filling compromise. A moderately sized pericardial effusion is present - Coreg was stopped at [facility] due to bradycardia.  - Elevated BNP in the range of 200s. -7/27: Scr stable today. Torsemide started. Follow UOP/wts.   COPD/chronic hypoxic respiratory failure - Uses 2 L of oxygen at baseline mainly during sleep. - Currently not in exacerbation. - Continue supplemental oxygen, as needed bronchodilators. - His new baseline use will be 2L [place] continuous.   Hypertension - hydralazine, lasix, amlodipine, imdur -7/27: BP is acceptable, follow  Epilepsy - Continue phenytoin  Type 2 diabetes mellitus - home meds: metformin -On SSIand lantus. - A1c of 8.3 - DM diet -7/27: Glucose is acceptable, follow  Lower extremity lymphedema, venous insufficiency/cellulitis - He was treated with  antibiotics at [facility]. - Currently off antibiotics.  - Followed by wound care and recommended  Unna boots. - Continue compression stockings/dressings on lower extremities.      - continue unna boots  Debility/deconditioning - Complains of severe generalized weakness and is unable to ambulate. - Very frustrated due to his current condition. - PT/OT recommended skilled nursing facility on discharge.  Couagulopathy - pharmacy dosing coumadin - several drug-drug interactions at play here - no frank bleed noted -INRis stable at 2.9; follow  DVT prophylaxis: Coumadin Code Status: FULL Family Communication: None at bedside.   Status is: Inpatient  Remains inpatient appropriate because:Inpatient level of care appropriate due to severity of illness   Dispo: The patient is from: Home              Anticipated d/c is to: SNF              Anticipated d/c date is: 3 days              Patient currently is not medically stable to d/c.  Consultants:   Cardiology  Neprhology  ROS:  Reports fatigue. Denies CP, N, V, ab pain . Remainder ROS is negative for all not previously mentioned.  Subjective: "I just don't have any energy, doc."  Objective: Vitals:   12/24/19 0505 12/24/19 0738 12/24/19 0950 12/24/19 1202  BP: 128/81 (!) 159/86  (!) 157/77  Pulse: 66 63  66  Resp: 21 20 23 15   Temp: (!) 97.5 F (36.4 C) 97.6 F (36.4 C)  97.6 F (36.4 C)  TempSrc: Oral Oral  Oral  SpO2: 96% 99%  93%  Weight:      Height:        Intake/Output Summary (Last 24 hours) at 12/24/2019 1226 Last data filed at 12/24/2019 1203 Gross per 24 hour  Intake 840 ml  Output 750 ml  Net 90 ml   Filed Weights   12/21/19 0404 12/22/19 0500 12/23/19 0500  Weight: (!) 138.3 kg (!) 137.1 kg (!) 137.3 kg    Examination:  General: 64 y.o. male resting in bed in NAD Cardiovascular: RRR, +S1, S2, no m/g/r Respiratory: CTABL, no w/r/r, normal WOB,  on 2L [place] GI: BS+, NDNT, no masses noted, soft MSK: No c/c; BLE edema/unna boots Neuro: A&O x 3, no focal deficits Psyc: Appropriate interaction and affect, calm/cooperative  Data Reviewed: I have personally reviewed following labs and imaging studies.  CBC: Recent Labs  Lab 12/18/19 0443 12/18/19 0443 12/19/19 0744 12/20/19 0350 12/21/19 0345 12/23/19 0241 12/24/19 0257  WBC 11.3*   < > 9.8 10.5 11.2* 11.3* 11.8*  NEUTROABS 9.0*  --  7.7 8.2* 9.0* 9.1*  --   HGB 9.8*   < > 9.7* 9.5* 9.3* 9.0* 9.0*  HCT 32.5*   < > 32.1* 31.4* 30.6* 29.5* 29.1*  MCV 96.7   < > 95.0 93.5 93.0 91.9 93.0  PLT 296   < > 290 280 290 303 300   < > = values in this interval not displayed.   Basic Metabolic Panel: Recent Labs  Lab 12/20/19 0350 12/21/19 0345 12/22/19 0405 12/23/19 0241 12/24/19 0257  NA 135 134* 134* 133* 133*  K 4.2 3.9 4.1 4.1 3.8  CL 88* 86* 87* 85* 86*  CO2 33* 34* 30 33* 32  GLUCOSE 159* 167* 165* 180* 225*  BUN 55* 55* 54* 57* 57*  CREATININE 2.79* 2.90* 2.87* 3.08* 2.94*  CALCIUM 8.9 8.8* 8.9 8.9 8.7*  MG 1.3* 1.5*  --  1.4* 1.7   GFR: Estimated Creatinine Clearance: 36 mL/min (A) (by C-G  formula based on SCr of 2.94 mg/dL (H)). Liver Function Tests: Recent Labs  Lab 12/22/19 0832 12/23/19 0241 12/24/19 0257  ALBUMIN 2.7* 2.7* 2.7*   No results for input(s): LIPASE, AMYLASE in the last 168 hours. No results for input(s): AMMONIA in the last 168 hours. Coagulation Profile: Recent Labs  Lab 12/20/19 0350 12/21/19 0345 12/22/19 0405 12/23/19 0241 12/24/19 0257  INR 3.4* 3.1* 2.7* 2.9* 2.9*   Cardiac Enzymes: No results for input(s): CKTOTAL, CKMB, CKMBINDEX, TROPONINI in the last 168 hours. BNP (last 3 results) No results for input(s): PROBNP in the last 8760 hours. HbA1C: No results for input(s): HGBA1C in the last 72 hours. CBG: Recent Labs  Lab 12/23/19 2123 12/24/19 0627 12/24/19 0753 12/24/19 1057 12/24/19 1200  GLUCAP 189* 178* 155*  179* 173*   Lipid Profile: No results for input(s): CHOL, HDL, LDLCALC, TRIG, CHOLHDL, LDLDIRECT in the last 72 hours. Thyroid Function Tests: No results for input(s): TSH, T4TOTAL, FREET4, T3FREE, THYROIDAB in the last 72 hours. Anemia Panel: No results for input(s): VITAMINB12, FOLATE, FERRITIN, TIBC, IRON, RETICCTPCT in the last 72 hours. Sepsis Labs: No results for input(s): PROCALCITON, LATICACIDVEN in the last 168 hours.  Recent Results (from the past 240 hour(s))  SARS CORONAVIRUS 2 (TAT 6-24 HRS) Nasopharyngeal Nasopharyngeal Swab     Status: None   Collection Time: 12/19/19  6:00 PM   Specimen: Nasopharyngeal Swab  Result Value Ref Range Status   SARS Coronavirus 2 NEGATIVE NEGATIVE Final    Comment: (NOTE) SARS-CoV-2 target nucleic acids are NOT DETECTED.  The SARS-CoV-2 RNA is generally detectable in upper and lower respiratory specimens during the acute phase of infection. Negative results do not preclude SARS-CoV-2 infection, do not rule out co-infections with other pathogens, and should not be used as the sole basis for treatment or other patient management decisions. Negative results must be combined with clinical observations, patient history, and epidemiological information. The expected result is Negative.  Fact Sheet for Patients: SugarRoll.be  Fact Sheet for Healthcare Providers: https://www.woods-mathews.com/  This test is not yet approved or cleared by the [place] FDA and  has been authorized for detection and/or diagnosis of SARS-CoV-2 by FDA under an Emergency Use Authorization (EUA). This EUA will remain  in effect (meaning this test can be used) for the duration of the COVID-19 declaration under Se ction 564(b)(1) of the Act, 21 U.S.C. section 360bbb-3(b)(1), unless the authorization is terminated or revoked sooner.  Performed at [facility], [place] [address], [place], [place] [address]        Radiology Studies: No results found.   Scheduled Meds:  amiodarone  200 mg Oral BID   Followed by   Derrill Memo ON 12/27/2019] amiodarone  200 mg Oral Daily   amLODipine  10 mg Oral Daily   hydrALAZINE  100 mg Oral Q8H   insulin aspart  0-5 Units Subcutaneous QHS   insulin aspart  0-9 Units Subcutaneous TID WC   insulin aspart  2 Units Subcutaneous TID WC   insulin glargine  12 Units Subcutaneous Daily   isosorbide mononitrate  120 mg Oral Daily   pravastatin  40 mg Oral q1800   sodium chloride flush  3 mL Intravenous Q12H   torsemide  60 mg Oral Daily   warfarin  0.5 mg Oral ONCE-1600   Warfarin - Pharmacist Dosing Inpatient   Does not apply q1600   Continuous Infusions:  sodium chloride       LOS: 10 days  Time spent: 25 minutes spent in the coordination of care today.    Jonnie Finner, DO [facility]  If 7PM-7AM, please contact night-coverage [email or web address] 12/24/2019, 12:26 PM

## 2019-12-24 NOTE — Progress Notes (Signed)
Phenytoin Follow-Up Consult Indication: epilepsy  Allergies  Allergen Reactions  . Codeine     Patient Measurements: Height: 6' (182.9 cm) Weight: (!) 137.3 kg (302 lb 11.1 oz) IBW/kg (Calculated) : 77.6 TPN AdjBW (KG): 93.8 Body mass index is 41.05 kg/m.   Vital signs: Temp: 97.5 F (36.4 C) (07/27 0505) Temp Source: Oral (07/27 0505) BP: 128/81 (07/27 0505) Pulse Rate: 66 (07/27 0505)  Labs: Lab Results  Component Value Date/Time   Albumin 2.7 (L) 12/24/2019 0257   Phenytoin Lvl 14.4 12/24/2019 0257   Lab Results  Component Value Date   PHENYTOIN 14.4 12/24/2019   Estimated Creatinine Clearance: 36 mL/min (A) (by C-G formula based on SCr of 2.94 mg/dL (H)).   Medications:  Scheduled:  . amiodarone  200 mg Oral BID   Followed by  . [START ON 12/27/2019] amiodarone  200 mg Oral Daily  . amLODipine  10 mg Oral Daily  . hydrALAZINE  100 mg Oral Q8H  . insulin aspart  0-5 Units Subcutaneous QHS  . insulin aspart  0-9 Units Subcutaneous TID WC  . insulin aspart  2 Units Subcutaneous TID WC  . insulin glargine  12 Units Subcutaneous Daily  . isosorbide mononitrate  120 mg Oral Daily  . pravastatin  40 mg Oral q1800  . sodium chloride flush  3 mL Intravenous Q12H  . Warfarin - Pharmacist Dosing Inpatient   Does not apply q1600    Assessment: 64 yo male on home dose of Dilantin 300mg  twice daily. Low albumin and poor renal function may increase free phenytoin levels. On 7/25 phenytoin level was found to be supratherapeutic and phenytoin was held. Last dose of phenytoin was administered at ~0930 on 7/25. Phenytoin level this morning was found to be 14.4 which when corrected for low albumin is 22.5 and supratherapeutic.   Corrected phenytoin level (if needed): 22.5 Seizure activity: none charted Significant potential drug interactions: warfarin   Goals of care:  Total phenytoin level: 10-20 mcg/ml Free phenytoin level: 1-2 mcg/ml  Plan:  Continue to hold  phenytoin today Recheck level in the morning and dose accordingly if needed  Cristela Felt, PharmD Clinical Pharmacist

## 2019-12-24 NOTE — Progress Notes (Signed)
Physical Therapy Treatment Patient Details Name: Matthew Gutierrez MRN: [id number] DOB: July 04, 1955 Today's Date: 12/24/2019    History of Present Illness  64 y.o. male with medical history significant for DVT on warfarin, obesity, lymphedema, peripheral vascular disease, epilepsy, chronic kidney disease stage III, and hypertension who presented to [facility] emergency department on 12/04/2019 for evaluation of increased swelling and redness involving the left lower extremity. Patient admitted [facility] 7/17 Acute kidney injury superimposed on CKD IIIa, Paroxysmal atrial fibrillation, COPD; chronic hypoxic respiratory failure.     PT Comments    Pt continues to make slow, steady progress. Continue to recommend ST-SNF.    Follow Up Recommendations  SNF;Supervision for mobility/OOB     Equipment Recommendations  None recommended by PT    Recommendations for Other Services       Precautions / Restrictions Precautions Precautions: Fall    Mobility  Bed Mobility               General bed mobility comments: Pt sitting EOB  Transfers Overall transfer level: Needs assistance Equipment used: Rolling walker (2 wheeled) Transfers: Sit to/from Stand Sit to Stand: Min assist         General transfer comment: Assist to bring hips up and for balance.   Ambulation/Gait Ambulation/Gait assistance: Min assist;+2 safety/equipment Gait Distance (Feet): 50 Feet (50' x 1, 40' x 1) Assistive device: Rolling walker (2 wheeled) Gait Pattern/deviations: Step-through pattern;Decreased stride length;Trunk flexed;Wide base of support Gait velocity: decr Gait velocity interpretation: <1.8 ft/sec, indicate of risk for recurrent falls General Gait Details: Assist for balance and support and second person following with chair.    Stairs             Wheelchair Mobility    Modified Rankin (Stroke Patients Only)       Balance Overall balance assessment: Needs  assistance Sitting-balance support: Feet supported;No upper extremity supported Sitting balance-Leahy Scale: Fair     Standing balance support: During functional activity;Bilateral upper extremity supported Standing balance-Leahy Scale: Poor Standing balance comment: walker and min guard for static standing                            Cognition Arousal/Alertness: Awake/alert Behavior During Therapy: WFL for tasks assessed/performed Overall Cognitive Status: Impaired/Different from baseline Area of Impairment: Memory;Problem solving                     Memory: Decreased short-term memory       Problem Solving: Requires verbal cues;Requires tactile cues        Exercises      General Comments General comments (skin integrity, edema, etc.): Pt on 2L of O2.      Pertinent Vitals/Pain      Home Living                      Prior Function            PT Goals (current goals can now be found in the care plan section) Acute Rehab PT Goals Patient Stated Goal: to get back to walking Progress towards PT goals: Progressing toward goals    Frequency    Min 3X/week      PT Plan Current plan remains appropriate    Co-evaluation              AM-PAC PT "6 Clicks" Mobility   Outcome Measure  Help needed turning from your back  to your side while in a flat bed without using bedrails?: A Little Help needed moving from lying on your back to sitting on the side of a flat bed without using bedrails?: A Lot Help needed moving to and from a bed to a chair (including a wheelchair)?: A Lot Help needed standing up from a chair using your arms (e.g., wheelchair or bedside chair)?: A Little Help needed to walk in hospital room?: A Little Help needed climbing 3-5 steps with a railing? : Total 6 Click Score: 14    End of Session Equipment Utilized During Treatment: Oxygen (pt too large for gait belt) Activity Tolerance: Patient tolerated treatment  well Patient left: in chair;with call bell/phone within reach Nurse Communication: Mobility status PT Visit Diagnosis: Muscle weakness (generalized) (M62.81);Difficulty in walking, not elsewhere classified (R26.2)     Time: 4103-0131 PT Time Calculation (min) (ACUTE ONLY): 21 min  Charges:  $Gait Training: 8-22 mins                     [place] Pager [phone] [facility] [place] 12/24/2019, 9:25 AM

## 2019-12-24 NOTE — Progress Notes (Signed)
ANTICOAGULATION - Follow Up Consult  Pharmacy Consult for Coumadin Indication: h/o DVT and new afib  Allergies  Allergen Reactions  . Codeine    Vital Signs: Temp: 97.5 F (36.4 C) (07/27 0505) Temp Source: Oral (07/27 0505) BP: 128/81 (07/27 0505) Pulse Rate: 66 (07/27 0505)  Labs: Recent Labs    12/22/19 0405 12/23/19 0241 12/24/19 0257  HGB  --  9.0* 9.0*  HCT  --  29.5* 29.1*  PLT  --  303 300  LABPROT 27.7* 29.2* 29.0*  INR 2.7* 2.9* 2.9*  CREATININE 2.87* 3.08* 2.94*    Estimated Creatinine Clearance: 36 mL/min (A) (by C-G formula based on SCr of 2.94 mg/dL (H)).  Medical History: Past Medical History:  Diagnosis Date  . Epilepsy, grand mal ([place])   . Frozen shoulder    Right Shoulder  . Lumbago   . Peripheral vascular disease ([place])   . Stroke [facility])    Assessment: 64 y.o. M presented from [facility] on 7/17 with history of DVT and new onset atrial fibrillation. Patient on Coumadin PTA with home dose of 4 mg daily. Admission INR at [facility] 2.6. Patient was started on amiodarone during this admission which may potentiate the effects of warfarin.   INR within goal today at 2.9. No s/sx bleeding noted. PO intake variable, H/H low but stable. Albumin level low at 2.7.    Goal of Therapy:  INR 2-3 Monitor platelets by anticoagulation protocol: Yes   Plan:  Warfarin 0.5mg  tonight Monitor INR, CBC and s/s of bleeding daily   Cristela Felt, PharmD Clinical Pharmacist

## 2019-12-24 NOTE — Plan of Care (Signed)
  Problem: Nutrition: Goal: Adequate nutrition will be maintained Outcome: Not Progressing   Problem: Coping: Goal: Level of anxiety will decrease Outcome: Not Progressing   Problem: Clinical Measurements: Goal: Cardiovascular complication will be avoided Outcome: Not Progressing   Problem: Activity: Goal: Risk for activity intolerance will decrease Outcome: Not Progressing

## 2019-12-25 DIAGNOSIS — I4891 Unspecified atrial fibrillation: Secondary | ICD-10-CM | POA: Diagnosis not present

## 2019-12-25 DIAGNOSIS — L03119 Cellulitis of unspecified part of limb: Secondary | ICD-10-CM | POA: Diagnosis not present

## 2019-12-25 DIAGNOSIS — G40A09 Absence epileptic syndrome, not intractable, without status epilepticus: Secondary | ICD-10-CM | POA: Diagnosis not present

## 2019-12-25 DIAGNOSIS — N179 Acute kidney failure, unspecified: Secondary | ICD-10-CM | POA: Diagnosis not present

## 2019-12-25 LAB — CBC
HCT: 30.9 % — ABNORMAL LOW (ref 39.0–52.0)
Hemoglobin: 9.5 g/dL — ABNORMAL LOW (ref 13.0–17.0)
MCH: 28.8 pg (ref 26.0–34.0)
MCHC: 30.7 g/dL (ref 30.0–36.0)
MCV: 93.6 fL (ref 80.0–100.0)
Platelets: 315 K/uL (ref 150–400)
RBC: 3.3 MIL/uL — ABNORMAL LOW (ref 4.22–5.81)
RDW: 14.8 % (ref 11.5–15.5)
WBC: 11.6 K/uL — ABNORMAL HIGH (ref 4.0–10.5)
nRBC: 0 % (ref 0.0–0.2)

## 2019-12-25 LAB — GLUCOSE, CAPILLARY
Glucose-Capillary: 135 mg/dL — ABNORMAL HIGH (ref 70–99)
Glucose-Capillary: 187 mg/dL — ABNORMAL HIGH (ref 70–99)
Glucose-Capillary: 187 mg/dL — ABNORMAL HIGH (ref 70–99)
Glucose-Capillary: 159 mg/dL — ABNORMAL HIGH (ref 70–99)

## 2019-12-25 LAB — BASIC METABOLIC PANEL
Anion gap: 17 — ABNORMAL HIGH (ref 5–15)
BUN: 56 mg/dL — ABNORMAL HIGH (ref 8–23)
CO2: 31 mmol/L (ref 22–32)
Calcium: 8.8 mg/dL — ABNORMAL LOW (ref 8.9–10.3)
Chloride: 88 mmol/L — ABNORMAL LOW (ref 98–111)
Creatinine, Ser: 2.89 mg/dL — ABNORMAL HIGH (ref 0.61–1.24)
GFR calc Af Amer: 25 mL/min — ABNORMAL LOW (ref >60)
GFR calc non Af Amer: 22 mL/min — ABNORMAL LOW (ref >60)
Glucose, Bld: 156 mg/dL — ABNORMAL HIGH (ref 70–99)
Potassium: 3.7 mmol/L (ref 3.5–5.1)
Sodium: 136 mmol/L (ref 135–145)

## 2019-12-25 LAB — ALBUMIN: Albumin: 2.8 g/dL — ABNORMAL LOW (ref 3.5–5.0)

## 2019-12-25 LAB — PHENYTOIN LEVEL, TOTAL: Phenytoin Lvl: 11.5 ug/mL (ref 10.0–20.0)

## 2019-12-25 LAB — MAGNESIUM: Magnesium: 1.6 mg/dL — ABNORMAL LOW (ref 1.7–2.4)

## 2019-12-25 LAB — PROTIME-INR
INR: 2.9 — ABNORMAL HIGH (ref 0.8–1.2)
Prothrombin Time: 29.6 seconds — ABNORMAL HIGH (ref 11.4–15.2)

## 2019-12-25 MED ORDER — PHENYTOIN SODIUM EXTENDED 100 MG PO CAPS
100.0000 mg | ORAL_CAPSULE | Freq: Two times a day (BID) | ORAL | Status: DC
  Administered 2019-12-25 – 2019-12-27 (×5): 100 mg via ORAL
  Filled 2019-12-25 (×6): qty 1

## 2019-12-25 MED ORDER — WARFARIN 0.5 MG HALF TABLET
0.5000 mg | ORAL_TABLET | Freq: Once | ORAL | Status: AC
  Administered 2019-12-25: 0.5 mg via ORAL
  Filled 2019-12-25: qty 1

## 2019-12-25 NOTE — Progress Notes (Signed)
ANTICOAGULATION - Follow Up Consult  Pharmacy Consult for Coumadin Indication: h/o DVT and new afib  Allergies  Allergen Reactions  . Codeine    Vital Signs: Temp: 97.4 F (36.3 C) (07/28 0301) Temp Source: Oral (07/28 0301) BP: 142/66 (07/28 0301) Pulse Rate: 61 (07/28 0301)  Labs: Recent Labs    12/23/19 0241 12/24/19 0257 12/25/19 0449  HGB 9.0* 9.0*  --   HCT 29.5* 29.1*  --   PLT 303 300  --   LABPROT 29.2* 29.0* 29.6*  INR 2.9* 2.9* 2.9*  CREATININE 3.08* 2.94* 2.89*    Estimated Creatinine Clearance: 36.4 mL/min (A) (by C-G formula based on SCr of 2.89 mg/dL (H)).  Medical History: Past Medical History:  Diagnosis Date  . Epilepsy, grand mal ([place])   . Frozen shoulder    Right Shoulder  . Lumbago   . Peripheral vascular disease ([place])   . Stroke [facility])    Assessment: 64 y.o. M presented from [facility] on 7/17 with history of DVT and new onset atrial fibrillation. Patient on Coumadin PTA with home dose of 4 mg daily. Admission INR at [facility] 2.6. Patient was started on amiodarone during this admission which may potentiate the effects of warfarin.  -INR within goal today at 2.9.  Goal of Therapy:  INR 2-3 Monitor platelets by anticoagulation protocol: Yes   Plan:  Warfarin 0.5mg  tonight Monitor INR  Hildred Laser, PharmD Clinical Pharmacist **Pharmacist phone directory can now be found on [email or web address] ([email or web address]).  Listed under [facility].

## 2019-12-25 NOTE — Progress Notes (Signed)
PROGRESS NOTE    Matthew Gutierrez  EXB:[id number] DOB: [birth year] DOA: 12/14/2019 PCP: Lillard Anes, MD    Brief Narrative:  Patient was transferred from [facility] on 07/17, for acute kidney injury on chronic disease stage IIIa.   63 year old male who was admitted to [facility] on July 7 for left lower extremity cellulitis.  He does have the past medical history of DVT, lymphedema, obesity, peripheral vascular disease, epilepsy, hypertension and chronic kidney disease stage IIIa.  Patient was admitted to the medical ward, he was placed on intravenous clindamycin and then transitioned to cefazolin and cefepime.  He had worsening kidney function, his serum creatinine reached 4.8, his antibiotics were discontinued.  Patient received intravenous fluids, intravenous bicarbonate, intravenous calcium, furosemide and oral Kayexalate.  During his hospitalization he developed atrial fibrillation with rapid ventricular response, he received IV diltiazem, carvedilol and amiodarone. At the time of transfer his blood pressure was 190/77, heart rate 76, respirate 20, temperature 98.4, oxygen saturation 95%.  He had moist mucous membranes, lungs clear to auscultation bilaterally, heart S1-S2, present rhythmic, his abdomen was protuberant, positive bilateral lower extremity edema, positive erythema distally, dressing in place. Sodium 135, potassium 4.3, chloride 89, bicarb 33, glucose 195, BUN 52, creatinine 2.45, white count 14.9, hemoglobin 9.3, hematocrit 30.1, platelets 277.  SARS COVID-19 negative.  Patient had close monitoring of renal function, eventually started on diuresis for acute on chronic diastolic heart failure exacerbation.  Assessment & Plan:   Principal Problem:   Acute renal failure superimposed on stage 3 chronic kidney disease (HCC) Active Problems:   Essential hypertension   History of DVT (deep vein thrombosis)   Absence epileptic syndrome, not intractable,  without status epilepticus (HCC)   COPD (chronic obstructive pulmonary disease) (HCC)   Chronic respiratory failure with hypoxia (HCC)   Cellulitis of leg   Atrial fibrillation with RVR ([place])   1. AKI on CKD stage 3a/ hypomagnesemia. Stable renal function with serum cr at 2,89 with K at 3,7 and serum bicarbonate at 31. Mg at 1,6.   Will add 2 g Mag sulfate today and will follow renal panel with electrolytes in am.   2. Atrial fibrillation with rapid ventricular response. Continue rate control with amiodarone. Anticoagulation with warfarin. Continue close telemetry monitoring.   3. Acute on chronic diastolic heart failure with acute on chronic hypoxic respiratory failure. Patient with chronic lower extremity edema and obesity, challenging to assess volume status. Urine output over last 24 H is 1,425 ml.  Continue torsemide 60 mg daily.   4. HTN. Continue blood pressure control with amlodipine, hydralazine,and isosorbide.   5. Left lower extremity cellulitis. Chronic lymphedema.  Patient has completed antibiotic therapy. Bilateral lower extremity compression dressings.   6. COPD. No clinical signs of acute exacerbation.   7. Hx of DVT. Continue anticoagulation with warfarin.   8. Seizures. Continue with phenytoin. No active seizures.   9. Uncontrolled T2DM with Hgb A1c 8.3. Continue insulin sliding scale for glucose cover and monitoring.   10. Obesity class 3. Calculated BMI is 40,6. Will need outpatient follow up.   Status is: Inpatient  Remains inpatient appropriate because:Unsafe d/c plan   Dispo: The patient is from: Home              Anticipated d/c is to: SNF              Anticipated d/c date is: 1 day              Patient  currently is medically stable to d/c.   DVT prophylaxis: Warfarin   Code Status:   full  Family Communication:  No family at the bedside       Consultants:   Cardiology   Nephrology     Subjective: Patient continue to be very weak and  deconditioned, dyspnea has been improving, not yet back to baseline.   Objective: Vitals:   12/25/19 0316 12/25/19 0808 12/25/19 1125 12/25/19 1424  BP:  (!) 147/81 (!) 172/82 121/65  Pulse:  66 69 69  Resp: 21 17 21    Temp:  97.6 F (36.4 C) 97.8 F (36.6 C)   TempSrc:  Oral Oral   SpO2:  99% 100%   Weight: (!) 136.1 kg     Height:        Intake/Output Summary (Last 24 hours) at 12/25/2019 1448 Last data filed at 12/25/2019 0856 Gross per 24 hour  Intake 123 ml  Output 1225 ml  Net -1102 ml   Filed Weights   12/22/19 0500 12/23/19 0500 12/25/19 0316  Weight: (!) 137.1 kg (!) 137.3 kg (!) 136.1 kg    Examination:   General: Not in pain or dyspnea, deconditioned  Neurology: Awake and alert, non focal  E ENT: positive pallor, no icterus, oral mucosa moist Cardiovascular: No JVD. S1-S2 present, rhythmic, no gallops, rubs, or murmurs. +++ non pitting lower extremity edema. Pulmonary: positive breath sounds bilaterally,  no wheezing, rhonchi, positive bilateral expiratory rales. Gastrointestinal. Abdomen soft and non tender Skin. No rashes Musculoskeletal: no joint deformities     Data Reviewed: I have personally reviewed following labs and imaging studies  CBC: Recent Labs  Lab 12/19/19 0744 12/19/19 0744 12/20/19 0350 12/21/19 0345 12/23/19 0241 12/24/19 0257 12/25/19 0752  WBC 9.8   < > 10.5 11.2* 11.3* 11.8* 11.6*  NEUTROABS 7.7  --  8.2* 9.0* 9.1*  --   --   HGB 9.7*   < > 9.5* 9.3* 9.0* 9.0* 9.5*  HCT 32.1*   < > 31.4* 30.6* 29.5* 29.1* 30.9*  MCV 95.0   < > 93.5 93.0 91.9 93.0 93.6  PLT 290   < > 280 290 303 300 315   < > = values in this interval not displayed.   Basic Metabolic Panel: Recent Labs  Lab 12/20/19 0350 12/20/19 0350 12/21/19 0345 12/22/19 0405 12/23/19 0241 12/24/19 0257 12/25/19 0449  NA 135   < > 134* 134* 133* 133* 136  K 4.2   < > 3.9 4.1 4.1 3.8 3.7  CL 88*   < > 86* 87* 85* 86* 88*  CO2 33*   < > 34* 30 33* 32 31   GLUCOSE 159*   < > 167* 165* 180* 225* 156*  BUN 55*   < > 55* 54* 57* 57* 56*  CREATININE 2.79*   < > 2.90* 2.87* 3.08* 2.94* 2.89*  CALCIUM 8.9   < > 8.8* 8.9 8.9 8.7* 8.8*  MG 1.3*  --  1.5*  --  1.4* 1.7 1.6*   < > = values in this interval not displayed.   GFR: Estimated Creatinine Clearance: 36.4 mL/min (A) (by C-G formula based on SCr of 2.89 mg/dL (H)). Liver Function Tests: Recent Labs  Lab 12/22/19 0832 12/23/19 0241 12/24/19 0257 12/25/19 0449  ALBUMIN 2.7* 2.7* 2.7* 2.8*   No results for input(s): LIPASE, AMYLASE in the last 168 hours. No results for input(s): AMMONIA in the last 168 hours. Coagulation Profile: Recent Labs  Lab 12/21/19 0345  12/22/19 0405 12/23/19 0241 12/24/19 0257 12/25/19 0449  INR 3.1* 2.7* 2.9* 2.9* 2.9*   Cardiac Enzymes: No results for input(s): CKTOTAL, CKMB, CKMBINDEX, TROPONINI in the last 168 hours. BNP (last 3 results) No results for input(s): PROBNP in the last 8760 hours. HbA1C: No results for input(s): HGBA1C in the last 72 hours. CBG: Recent Labs  Lab 12/24/19 1200 12/24/19 1701 12/24/19 2228 12/25/19 0614 12/25/19 1127  GLUCAP 173* 188* 165* 135* 187*   Lipid Profile: No results for input(s): CHOL, HDL, LDLCALC, TRIG, CHOLHDL, LDLDIRECT in the last 72 hours. Thyroid Function Tests: No results for input(s): TSH, T4TOTAL, FREET4, T3FREE, THYROIDAB in the last 72 hours. Anemia Panel: No results for input(s): VITAMINB12, FOLATE, FERRITIN, TIBC, IRON, RETICCTPCT in the last 72 hours.    Radiology Studies: I have reviewed all of the imaging during this hospital visit personally     Scheduled Meds: . amiodarone  200 mg Oral BID   Followed by  . [START ON 12/27/2019] amiodarone  200 mg Oral Daily  . amLODipine  10 mg Oral Daily  . hydrALAZINE  100 mg Oral Q8H  . insulin aspart  0-5 Units Subcutaneous QHS  . insulin aspart  0-9 Units Subcutaneous TID WC  . insulin aspart  2 Units Subcutaneous TID WC  . insulin  glargine  12 Units Subcutaneous Daily  . isosorbide mononitrate  120 mg Oral Daily  . phenytoin  100 mg Oral BID  . pravastatin  40 mg Oral q1800  . sodium chloride flush  3 mL Intravenous Q12H  . torsemide  60 mg Oral Daily  . warfarin  0.5 mg Oral ONCE-1600  . Warfarin - Pharmacist Dosing Inpatient   Does not apply q1600   Continuous Infusions: . sodium chloride       LOS: 11 days        Madia Carvell Gerome Apley, MD

## 2019-12-25 NOTE — Progress Notes (Signed)
Phenytoin Follow-Up Consult Indication: epilepsy  Allergies  Allergen Reactions  . Codeine     Patient Measurements: Height: 6' (182.9 cm) Weight: (!) 136.1 kg (300 lb 0.7 oz) IBW/kg (Calculated) : 77.6 TPN AdjBW (KG): 93.8 Body mass index is 40.69 kg/m.   Vital signs: Temp: 97.4 F (36.3 C) (07/28 0301) Temp Source: Oral (07/28 0301) BP: 142/66 (07/28 0301) Pulse Rate: 61 (07/28 0301)  Labs: Lab Results  Component Value Date/Time   Albumin 2.8 (L) 12/25/2019 0449   Phenytoin Lvl 11.5 12/25/2019 0449   Lab Results  Component Value Date   PHENYTOIN 11.5 12/25/2019   Estimated Creatinine Clearance: 36.4 mL/min (A) (by C-G formula based on SCr of 2.89 mg/dL (H)).   Medications:  Scheduled:  . amiodarone  200 mg Oral BID   Followed by  . [START ON 12/27/2019] amiodarone  200 mg Oral Daily  . amLODipine  10 mg Oral Daily  . hydrALAZINE  100 mg Oral Q8H  . insulin aspart  0-5 Units Subcutaneous QHS  . insulin aspart  0-9 Units Subcutaneous TID WC  . insulin aspart  2 Units Subcutaneous TID WC  . insulin glargine  12 Units Subcutaneous Daily  . isosorbide mononitrate  120 mg Oral Daily  . pravastatin  40 mg Oral q1800  . sodium chloride flush  3 mL Intravenous Q12H  . torsemide  60 mg Oral Daily  . Warfarin - Pharmacist Dosing Inpatient   Does not apply q1600    Assessment: 64 yo male on home dose of Dilantin 300mg  twice daily. Low albumin and poor renal function may increase free phenytoin levels. On 7/25 phenytoin level was found to be supratherapeutic and phenytoin was held. Last dose of phenytoin was administered at ~0930 on 7/25.  -Phenytoin level this morning was found to be 11.5 which when corrected for low albumin is 17and therapeutic.   Corrected phenytoin level (if needed): 22.5 Seizure activity: none charted Significant potential drug interactions: warfarin   Goals of care:  Total phenytoin level: 10-20 mcg/ml Free phenytoin level: 1-2  mcg/ml  Plan:  -Restart phenytoin at 100mg  po bid -Recheck phenytoin level in 5-7 days  Hildred Laser, PharmD Clinical Pharmacist **Pharmacist phone directory can now be found on [email or web address] ([email or web address]).  Listed under [place].

## 2019-12-25 NOTE — TOC Progression Note (Signed)
Transition of Care [facility]) - Progression Note    Patient Details  Name: Matthew Gutierrez MRN: [id number] Date of Birth: [birth year]  Transition of Care [facility]) CM/SW [place], [place] Phone Number: 12/25/2019, 3:51 PM  Clinical Narrative:     CSW requested [employer] start insurance authorization   Thurmond Butts, MSW, [place] Social Worker   Expected Discharge Plan: [place] Barriers to Discharge: Continued Medical Work up  Expected Discharge Plan and Services Expected Discharge Plan: [place] arrangements for the past 2 months: Single Family Home                                       Social Determinants of Health (SDOH) Interventions    Readmission Risk Interventions No flowsheet data found.

## 2019-12-25 NOTE — Progress Notes (Signed)
[facility] Progress Note    Assessment/ Plan:   1. AKI on CKD3 1. Likely related to diuretics, cardiorenal syndrome, was taking ACE inhibitor at presentation 2. Nonoliguric, slowly resolving 3. Cont torsemide 60 mg daily 4. No indication for dialysis 5. No further inpatient issues. Will sign off for now.  Please call with any questions or concerns.  Pt does need follow up with nephrology and I will make arrangements for appt and labs  2. Acute on chronic HFpEF, improving but not resolved, cardiology following 3. Hypertension, stable 4. Chronic lymphedema, legs wrapped   Daily weights, Daily Renal Panel, Strict I/Os, Avoid nephrotoxins (NSAIDs, judicious IV Contrast)   Rexene Agent  [facility] 12/22/2019, 2:14 PM   Subjective:    Breathing improved  SCr stable 2.9  1.4L UOP, neg 1L yesterday  Objective:   BP (!) 172/82 (BP Location: Left Arm)    Pulse 69    Temp 97.8 F (36.6 C) (Oral)    Resp 21    Ht 6' (1.829 m)    Wt (!) 136.1 kg    SpO2 100%    BMI 40.69 kg/m   Intake/Output Summary (Last 24 hours) at 12/25/2019 1253 Last data filed at 12/25/2019 0856 Gross per 24 hour  Intake 345 ml  Output 1325 ml  Net -980 ml   Weight change:   Physical Exam: GEN: wdwn, lying in bed, obese, NAD EYES: no scleral icterus, eomi CV: S1-S2, RRR, no M/R/G PULM: Clear to auscultation bilaterally, no crackles ABD: Soft, nontender/nondistended SKIN: chronic stasis changes bilateral LE's EXT: firm edema, currently wrapped Neuro: AAOx3, no focal deficits  Imaging: No results found.  Labs: BMET Recent Labs  Lab 12/19/19 0313 12/20/19 0350 12/21/19 0345 12/22/19 0405 12/23/19 0241 12/24/19 0257 12/25/19 0449  NA 135 135 134* 134* 133* 133* 136  K 4.6 4.2 3.9 4.1 4.1 3.8 3.7  CL 88* 88* 86* 87* 85* 86* 88*  CO2 33* 33* 34* 30 33* 32 31  GLUCOSE 144* 159* 167* 165* 180* 225* 156*  BUN 56* 55* 55* 54* 57* 57* 56*  CREATININE 2.78* 2.79*  2.90* 2.87* 3.08* 2.94* 2.89*  CALCIUM 8.9 8.9 8.8* 8.9 8.9 8.7* 8.8*   CBC Recent Labs  Lab 12/19/19 0744 12/19/19 0744 12/20/19 0350 12/20/19 0350 12/21/19 0345 12/23/19 0241 12/24/19 0257 12/25/19 0752  WBC 9.8   < > 10.5   < > 11.2* 11.3* 11.8* 11.6*  NEUTROABS 7.7  --  8.2*  --  9.0* 9.1*  --   --   HGB 9.7*   < > 9.5*   < > 9.3* 9.0* 9.0* 9.5*  HCT 32.1*   < > 31.4*   < > 30.6* 29.5* 29.1* 30.9*  MCV 95.0   < > 93.5   < > 93.0 91.9 93.0 93.6  PLT 290   < > 280   < > 290 303 300 315   < > = values in this interval not displayed.    Medications:     amiodarone  200 mg Oral BID   Followed by   Derrill Memo ON 12/27/2019] amiodarone  200 mg Oral Daily   amLODipine  10 mg Oral Daily   hydrALAZINE  100 mg Oral Q8H   insulin aspart  0-5 Units Subcutaneous QHS   insulin aspart  0-9 Units Subcutaneous TID WC   insulin aspart  2 Units Subcutaneous TID WC   insulin glargine  12 Units Subcutaneous Daily   isosorbide mononitrate  120 mg Oral Daily   phenytoin  100 mg Oral BID   pravastatin  40 mg Oral q1800   sodium chloride flush  3 mL Intravenous Q12H   torsemide  60 mg Oral Daily   warfarin  0.5 mg Oral ONCE-1600   Warfarin - Pharmacist Dosing Inpatient   Does not apply [id number]

## 2019-12-25 NOTE — Progress Notes (Addendum)
Occupational Therapy Treatment Patient Details Name: Matthew Gutierrez MRN: [id number] DOB: Sep 27, 1955 Today's Date: 12/25/2019    History of present illness  64 y.o. male with medical history significant for DVT on warfarin, obesity, lymphedema, peripheral vascular disease, epilepsy, chronic kidney disease stage III, and hypertension who presented to [facility] emergency department on 12/04/2019 for evaluation of increased swelling and redness involving the left lower extremity. [facility] Acute kidney injury superimposed on CKD IIIa, Paroxysmal atrial fibrillation, COPD; chronic hypoxic respiratory failure.    OT comments  Pt presents seated EOB, declined attempts at mobilizing but is agreeable to EOB activity. Pt engaging in seated UB/LB exercise implementing level 1 theraband throughout for continued strengthening and endurance as precursor to ADL/mobility tasks. Pt continues to require frequent rest breaks given fatigue, encouragement provided throughout. Feel SNF recommendation remains most appropriate at this time. Will continue to follow acutely.   Follow Up Recommendations  SNF    Equipment Recommendations  None recommended by OT          Precautions / Restrictions Precautions Precautions: Fall Restrictions Weight Bearing Restrictions: No       Mobility Bed Mobility               General bed mobility comments: Pt sitting EOB  Transfers Overall transfer level: Needs assistance   Transfers: Lateral/Scoot Transfers          Lateral/Scoot Transfers: Min assist General transfer comment: minA to scoot along EOB    Balance Overall balance assessment: Needs assistance Sitting-balance support: Feet supported;No upper extremity supported Sitting balance-Leahy Scale: Fair                                     ADL either performed or assessed with clinical judgement   ADL Overall ADL's : Needs assistance/impaired     Grooming: Set  up;Supervision/safety;Sitting Grooming Details (indicate cue type and reason): seated EOB                               General ADL Comments: pt engaging in seated activity EOB, contineus to require rest breaks throughout given he is easily fatigued      Vision       Perception     Praxis      Cognition Arousal/Alertness: Awake/alert Behavior During Therapy: WFL for tasks assessed/performed Overall Cognitive Status: Impaired/Different from baseline Area of Impairment: Memory;Problem solving                     Memory: Decreased short-term memory   Safety/Judgement: Decreased awareness of deficits   Problem Solving: Requires verbal cues;Requires tactile cues General Comments: pt continues to appear down regarding current status        Exercises General Exercises - Upper Extremity Shoulder Flexion: AROM;Right;Theraband Theraband Level (Shoulder Flexion): Level 1 (Yellow) Shoulder Horizontal ABduction: AROM;Both;10 reps;Seated Theraband Level (Shoulder Horizontal Abduction): Level 1 (Yellow) Shoulder Horizontal ADduction: AROM;Both;Seated;Theraband Theraband Level (Shoulder Horizontal Adduction): Level 1 (Yellow) Elbow Flexion: AROM;Both;10 reps;Seated;Theraband Theraband Level (Elbow Flexion): Level 1 (Yellow) Elbow Extension: AROM;Both;10 reps;Seated;Theraband Theraband Level (Elbow Extension): Level 1 (Yellow) General Exercises - Lower Extremity Long Arc Quad: AROM;Both;10 reps;Seated   Shoulder Instructions       General Comments increased RR with activity (up to 40s) which quickly returns to 20s given seated rest     Pertinent Vitals/  Pain       Pain Assessment: Faces Faces Pain Scale: Hurts a little bit Pain Location: L shoulder Pain Descriptors / Indicators: Discomfort Pain Intervention(s): Repositioned;Monitored during session  Home Living                                          Prior Functioning/Environment               Frequency  Min 2X/week        Progress Toward Goals  OT Goals(current goals can now be found in the care plan section)  Progress towards OT goals: Progressing toward goals  Acute Rehab OT Goals Patient Stated Goal: to get back to walking OT Goal Formulation: With patient Time For Goal Achievement: 12/29/19 Potential to Achieve Goals: Good ADL Goals Pt Will Perform Grooming: with supervision;sitting;standing Pt Will Perform Lower Body Dressing: with supervision;sit to/from stand;sitting/lateral leans;with adaptive equipment Pt Will Transfer to Toilet: with supervision;bedside commode;ambulating Pt Will Perform Toileting - Clothing Manipulation and hygiene: with supervision;sit to/from stand;sitting/lateral leans  Plan Discharge plan remains appropriate    Co-evaluation                 AM-PAC OT "6 Clicks" Daily Activity     Outcome Measure   Help from another person eating meals?: None Help from another person taking care of personal grooming?: A Little Help from another person toileting, which includes using toliet, bedpan, or urinal?: A Lot Help from another person bathing (including washing, rinsing, drying)?: A Lot Help from another person to put on and taking off regular upper body clothing?: A Little Help from another person to put on and taking off regular lower body clothing?: A Lot 6 Click Score: 16    End of Session Equipment Utilized During Treatment: Oxygen  OT Visit Diagnosis: Other abnormalities of gait and mobility (R26.89);Muscle weakness (generalized) (M62.81);Pain Pain - part of body: Shoulder   Activity Tolerance Patient tolerated treatment well;Patient limited by lethargy   Patient Left with call bell/phone within reach;with bed alarm set;Other (comment) (seated EOB)   Nurse Communication Mobility status        Time: 0539-7673 OT Time Calculation (min): 16 min  Charges: OT General Charges $OT Visit: 1 Visit OT  Treatments $Therapeutic Activity: 8-22 mins  Lou Cal, OT Acute Rehabilitation Services Pager [phone] Office [phone]    Raymondo Band 12/25/2019, 5:31 PM

## 2019-12-26 ENCOUNTER — Telehealth: Payer: Medicare HMO

## 2019-12-26 DIAGNOSIS — L03119 Cellulitis of unspecified part of limb: Secondary | ICD-10-CM | POA: Diagnosis not present

## 2019-12-26 DIAGNOSIS — G40A09 Absence epileptic syndrome, not intractable, without status epilepticus: Secondary | ICD-10-CM | POA: Diagnosis not present

## 2019-12-26 DIAGNOSIS — N179 Acute kidney failure, unspecified: Secondary | ICD-10-CM | POA: Diagnosis not present

## 2019-12-26 DIAGNOSIS — I4891 Unspecified atrial fibrillation: Secondary | ICD-10-CM | POA: Diagnosis not present

## 2019-12-26 LAB — BASIC METABOLIC PANEL
Anion gap: 14 (ref 5–15)
BUN: 54 mg/dL — ABNORMAL HIGH (ref 8–23)
CO2: 34 mmol/L — ABNORMAL HIGH (ref 22–32)
Calcium: 8.8 mg/dL — ABNORMAL LOW (ref 8.9–10.3)
Chloride: 89 mmol/L — ABNORMAL LOW (ref 98–111)
Creatinine, Ser: 2.9 mg/dL — ABNORMAL HIGH (ref 0.61–1.24)
GFR calc Af Amer: 25 mL/min — ABNORMAL LOW (ref >60)
GFR calc non Af Amer: 22 mL/min — ABNORMAL LOW (ref >60)
Glucose, Bld: 155 mg/dL — ABNORMAL HIGH (ref 70–99)
Potassium: 3.6 mmol/L (ref 3.5–5.1)
Sodium: 137 mmol/L (ref 135–145)

## 2019-12-26 LAB — PROTIME-INR
INR: 2.8 — ABNORMAL HIGH (ref 0.8–1.2)
Prothrombin Time: 29 seconds — ABNORMAL HIGH (ref 11.4–15.2)

## 2019-12-26 LAB — GLUCOSE, CAPILLARY
Glucose-Capillary: 146 mg/dL — ABNORMAL HIGH (ref 70–99)
Glucose-Capillary: 211 mg/dL — ABNORMAL HIGH (ref 70–99)
Glucose-Capillary: 202 mg/dL — ABNORMAL HIGH (ref 70–99)
Glucose-Capillary: 168 mg/dL — ABNORMAL HIGH (ref 70–99)

## 2019-12-26 LAB — SARS CORONAVIRUS 2 BY RT PCR (HOSPITAL ORDER, PERFORMED IN ~~LOC~~ HOSPITAL LAB): SARS Coronavirus 2: NEGATIVE

## 2019-12-26 MED ORDER — BISACODYL 5 MG PO TBEC
10.0000 mg | DELAYED_RELEASE_TABLET | Freq: Once | ORAL | Status: AC
  Administered 2019-12-26: 10 mg via ORAL
  Filled 2019-12-26: qty 2

## 2019-12-26 MED ORDER — TRAZODONE HCL 50 MG PO TABS
50.0000 mg | ORAL_TABLET | Freq: Every day | ORAL | Status: DC
  Administered 2019-12-26: 50 mg via ORAL
  Filled 2019-12-26: qty 1

## 2019-12-26 MED ORDER — WARFARIN 0.5 MG HALF TABLET
0.5000 mg | ORAL_TABLET | Freq: Once | ORAL | Status: AC
  Administered 2019-12-26: 0.5 mg via ORAL
  Filled 2019-12-26: qty 1

## 2019-12-26 NOTE — Progress Notes (Signed)
Plan of care reviewed. Pt's hemodynamically stable. Remained afebrile. On 2 LPM of O2 NCL, SPO2 96-100%, no respiratory distress. NSR on monitor, BP 140/65 - 160/ 72 mmHg. No acute distress noted. Continue to monitor.  Kennyth Lose, RN

## 2019-12-26 NOTE — Progress Notes (Signed)
PROGRESS NOTE    Rishikesh Khachatryan  XFG:[id number] DOB: 10-16-1955 DOA: 12/14/2019 PCP: Lillard Anes, MD    Brief Narrative:  Patient was transferred from [facility] on 07/17, for acute kidney injury on chronic disease stage IIIa.   64 year old male who was admitted to [facility] on July 7 for left lower extremity cellulitis.  He does have the past medical history of DVT, lymphedema, obesity, peripheral vascular disease, epilepsy, hypertension and chronic kidney disease stage IIIa.  Patient was admitted to the medical ward, he was placed on intravenous clindamycin and then transitioned to cefazolin and cefepime.  He had worsening kidney function, his serum creatinine reached 4.8, his antibiotics were discontinued.  Patient received intravenous fluids, intravenous bicarbonate, intravenous calcium, furosemide and oral Kayexalate.  During his hospitalization he developed atrial fibrillation with rapid ventricular response, he received IV diltiazem, carvedilol and amiodarone. At the time of transfer his blood pressure was 190/77, heart rate 76, respirate 20, temperature 98.4, oxygen saturation 95%.  He had moist mucous membranes, lungs clear to auscultation bilaterally, heart S1-S2, present rhythmic, his abdomen was protuberant, positive bilateral lower extremity edema, positive erythema distally, dressing in place. Sodium 135, potassium 4.3, chloride 89, bicarb 33, glucose 195, BUN 52, creatinine 2.45, white count 14.9, hemoglobin 9.3, hematocrit 30.1, platelets 277.  SARS COVID-19 negative.  Patient had close monitoring of renal function, eventually started on diuresis for acute on chronic diastolic heart failure exacerbation   Assessment & Plan:   Principal Problem:   Acute renal failure superimposed on stage 3 chronic kidney disease (HCC) Active Problems:   Essential hypertension   History of DVT (deep vein thrombosis)   Absence epileptic syndrome, not intractable,  without status epilepticus (HCC)   COPD (chronic obstructive pulmonary disease) (HCC)   Chronic respiratory failure with hypoxia (HCC)   Cellulitis of leg   Atrial fibrillation with RVR ([place])   1. AKI on CKD stage 3a/ hypomagnesemia. Continue to be stable renal function with serum cr at 2,90 with K at 3,6 and serum bicarbonate at 34.  Continue diuresis and blood pressure control. Will check bmp in 48 H. Avoid nephrotoxic medications or hypotension.   2. Atrial fibrillation with rapid ventricular response. Rate controlled, continue with amiodarone and anticoagulation with warfarin. Today INR is 2,8  Will discontinue telemetry monitoring for now.   3. Acute on chronic diastolic heart failure with acute on chronic hypoxic respiratory failure. Chronic lower extremity edema and obesity, challenging to assess volume status.   Tolerating well torsemide 60 mg daily, to keep negative fluid balance, continue blood pressure control with hydralazine, isosorbide and amlodipine.   Patient has not been compliant with sodium restriction at home.   4. HTN. On amlodipine, hydralazine,and isosorbide, for blood pressure control.   5. Left lower extremity cellulitis. Chronic lymphedema.  Patient has completed antibiotic therapy. Bilateral lower extremity compression dressings.   Plan to continue care at the SNF  6. COPD. No current clinical signs of acute exacerbation.   7. Hx of DVT.  On warfarin with therapeutic INR  8. Seizures/ anxiety. On phenytoin. No signs of active seizures. On clonazepam.   9. Uncontrolled T2DM with Hgb A1c 8.3/ dyslipidemia. On insulin sliding scale for glucose cover and monitoring. Basal insulin of 12 units and pre-meal 2 units.   Continue with pravastatin   10. Obesity class 3. Constipation. insomnia  Calculated BMI is 40,6. Need life style modifications. Bowel regimen with dulcolax. Add trazodone at night for sleep.  Status is: Inpatient  Remains  inpatient appropriate because:Unsafe d/c plan   Dispo: The patient is from: Home              Anticipated d/c is to: SNF              Anticipated d/c date is: 1 day              Patient currently is medically stable to d/c.   DVT prophylaxis: Warfarin   Code Status:   full  Family Communication:  I spoke with patient's sister at the bedside, we talked in detail about patient's condition, plan of care and prognosis and all questions were addressed.      Subjective: Patient continue to feel very weak and deconditioned, has been ambulating with physical therapy, no nausea or vomiting, continue to have very poor oral intake.   Objective: Vitals:   12/25/19 2313 12/26/19 0327 12/26/19 0755 12/26/19 0936  BP: (!) 153/59 (!) 160/72 (!) 158/79 (!) 150/77  Pulse: 66 63 67 65  Resp: 21 20 20    Temp: 98 F (36.7 C) 98.2 F (36.8 C) 97.6 F (36.4 C)   TempSrc: Oral Oral Oral   SpO2: 98% 96% 98%   Weight:  (!) 136 kg    Height:        Intake/Output Summary (Last 24 hours) at 12/26/2019 1208 Last data filed at 12/26/2019 1153 Gross per 24 hour  Intake 733 ml  Output 200 ml  Net 533 ml   Filed Weights   12/23/19 0500 12/25/19 0316 12/26/19 0327  Weight: (!) 137.3 kg (!) 136.1 kg (!) 136 kg    Examination:   General: Not in pain or dyspnea, deconditioned  Neurology: Awake and alert, non focal  E ENT: mild pallor, no icterus, oral mucosa moist Cardiovascular: No JVD. S1-S2 present, rhythmic, no gallops, rubs, or murmurs. Positive lower extremity edema. Compressions bandages in place.  Pulmonary: vesicular breath sounds bilaterally, adequate air movement, no wheezing, rhonchi or rales. Gastrointestinal. Abdomen soft and non tender Skin. No rashes Musculoskeletal: no joint deformities     Data Reviewed: I have personally reviewed following labs and imaging studies  CBC: Recent Labs  Lab 12/20/19 0350 12/21/19 0345 12/23/19 0241 12/24/19 0257 12/25/19 0752  WBC 10.5  11.2* 11.3* 11.8* 11.6*  NEUTROABS 8.2* 9.0* 9.1*  --   --   HGB 9.5* 9.3* 9.0* 9.0* 9.5*  HCT 31.4* 30.6* 29.5* 29.1* 30.9*  MCV 93.5 93.0 91.9 93.0 93.6  PLT 280 290 303 300 937   Basic Metabolic Panel: Recent Labs  Lab 12/20/19 0350 12/20/19 0350 12/21/19 0345 12/21/19 0345 12/22/19 0405 12/23/19 0241 12/24/19 0257 12/25/19 0449 12/26/19 0254  NA 135   < > 134*   < > 134* 133* 133* 136 137  K 4.2   < > 3.9   < > 4.1 4.1 3.8 3.7 3.6  CL 88*   < > 86*   < > 87* 85* 86* 88* 89*  CO2 33*   < > 34*   < > 30 33* 32 31 34*  GLUCOSE 159*   < > 167*   < > 165* 180* 225* 156* 155*  BUN 55*   < > 55*   < > 54* 57* 57* 56* 54*  CREATININE 2.79*   < > 2.90*   < > 2.87* 3.08* 2.94* 2.89* 2.90*  CALCIUM 8.9   < > 8.8*   < > 8.9 8.9 8.7* 8.8* 8.8*  MG  1.3*  --  1.5*  --   --  1.4* 1.7 1.6*  --    < > = values in this interval not displayed.   GFR: Estimated Creatinine Clearance: 36.3 mL/min (A) (by C-G formula based on SCr of 2.9 mg/dL (H)). Liver Function Tests: Recent Labs  Lab 12/22/19 0832 12/23/19 0241 12/24/19 0257 12/25/19 0449  ALBUMIN 2.7* 2.7* 2.7* 2.8*   No results for input(s): LIPASE, AMYLASE in the last 168 hours. No results for input(s): AMMONIA in the last 168 hours. Coagulation Profile: Recent Labs  Lab 12/22/19 0405 12/23/19 0241 12/24/19 0257 12/25/19 0449 12/26/19 0254  INR 2.7* 2.9* 2.9* 2.9* 2.8*   Cardiac Enzymes: No results for input(s): CKTOTAL, CKMB, CKMBINDEX, TROPONINI in the last 168 hours. BNP (last 3 results) No results for input(s): PROBNP in the last 8760 hours. HbA1C: No results for input(s): HGBA1C in the last 72 hours. CBG: Recent Labs  Lab 12/25/19 1127 12/25/19 1606 12/25/19 2113 12/26/19 0618 12/26/19 1116  GLUCAP 187* 187* 159* 146* 211*   Lipid Profile: No results for input(s): CHOL, HDL, LDLCALC, TRIG, CHOLHDL, LDLDIRECT in the last 72 hours. Thyroid Function Tests: No results for input(s): TSH, T4TOTAL, FREET4,  T3FREE, THYROIDAB in the last 72 hours. Anemia Panel: No results for input(s): VITAMINB12, FOLATE, FERRITIN, TIBC, IRON, RETICCTPCT in the last 72 hours.    Radiology Studies: I have reviewed all of the imaging during this hospital visit personally     Scheduled Meds:  amiodarone  200 mg Oral BID   Followed by   Derrill Memo ON 12/27/2019] amiodarone  200 mg Oral Daily   amLODipine  10 mg Oral Daily   bisacodyl  10 mg Oral Once   hydrALAZINE  100 mg Oral Q8H   insulin aspart  0-5 Units Subcutaneous QHS   insulin aspart  0-9 Units Subcutaneous TID WC   insulin aspart  2 Units Subcutaneous TID WC   insulin glargine  12 Units Subcutaneous Daily   isosorbide mononitrate  120 mg Oral Daily   phenytoin  100 mg Oral BID   pravastatin  40 mg Oral q1800   sodium chloride flush  3 mL Intravenous Q12H   torsemide  60 mg Oral Daily   traZODone  50 mg Oral QHS   warfarin  0.5 mg Oral ONCE-1600   Warfarin - Pharmacist Dosing Inpatient   Does not apply q1600   Continuous Infusions:  sodium chloride       LOS: 12 days        [employer] Gerome Apley, MD

## 2019-12-26 NOTE — Progress Notes (Signed)
Physical Therapy Treatment Patient Details Name: Matthew Gutierrez MRN: [id number] DOB: 05/28/1956 Today's Date: 12/26/2019    History of Present Illness  64 y.o. male with medical history significant for DVT on warfarin, obesity, lymphedema, peripheral vascular disease, epilepsy, chronic kidney disease stage III, and hypertension who presented to [facility] emergency department on 12/04/2019 for evaluation of increased swelling and redness involving the left lower extremity. Patient admitted [facility] 7/17 Acute kidney injury superimposed on CKD IIIa, Paroxysmal atrial fibrillation, COPD; chronic hypoxic respiratory failure.     PT Comments    Pt continues to make slow, steady progress. Continue to recommend SNF at DC.    Follow Up Recommendations  SNF;Supervision for mobility/OOB     Equipment Recommendations  None recommended by PT    Recommendations for Other Services       Precautions / Restrictions Precautions Precautions: Fall Restrictions Weight Bearing Restrictions: No    Mobility  Bed Mobility Overal bed mobility: Needs Assistance Bed Mobility: Sit to Sidelying         Sit to sidelying: Min assist;HOB elevated General bed mobility comments: assist to bring legs back up into bed.  Transfers Overall transfer level: Needs assistance Equipment used: Rolling walker (2 wheeled) Transfers: Sit to/from Stand Sit to Stand: Min assist         General transfer comment: Assist to bring hips up and for balance.   Ambulation/Gait Ambulation/Gait assistance: Min assist;+2 safety/equipment Gait Distance (Feet): 70 Feet (70' x 1, 40' x 1) Assistive device: Rolling walker (2 wheeled) Gait Pattern/deviations: Step-through pattern;Decreased stride length;Trunk flexed;Wide base of support Gait velocity: decr Gait velocity interpretation: <1.8 ft/sec, indicate of risk for recurrent falls General Gait Details: Assist for balance and support and second person following with  chair.    Stairs             Wheelchair Mobility    Modified Rankin (Stroke Patients Only)       Balance Overall balance assessment: Needs assistance Sitting-balance support: Feet supported;No upper extremity supported Sitting balance-Leahy Scale: Fair     Standing balance support: During functional activity;Bilateral upper extremity supported Standing balance-Leahy Scale: Poor Standing balance comment: walker and min guard for static standing                            Cognition Arousal/Alertness: Awake/alert Behavior During Therapy: WFL for tasks assessed/performed Overall Cognitive Status: Impaired/Different from baseline Area of Impairment: Memory;Problem solving                     Memory: Decreased short-term memory       Problem Solving: Requires verbal cues;Requires tactile cues        Exercises      General Comments General comments (skin integrity, edema, etc.): On 2L of O2 with SpO2 94-96%. Dyspnea 3/4.       Pertinent Vitals/Pain Pain Assessment: No/denies pain    Home Living                      Prior Function            PT Goals (current goals can now be found in the care plan section) Acute Rehab PT Goals Patient Stated Goal: to get back to walking Progress towards PT goals: Progressing toward goals    Frequency    Min 2X/week      PT Plan Current plan remains appropriate;Frequency needs to be updated  Co-evaluation              AM-PAC PT "6 Clicks" Mobility   Outcome Measure  Help needed turning from your back to your side while in a flat bed without using bedrails?: A Little Help needed moving from lying on your back to sitting on the side of a flat bed without using bedrails?: A Lot Help needed moving to and from a bed to a chair (including a wheelchair)?: A Little Help needed standing up from a chair using your arms (e.g., wheelchair or bedside chair)?: A Little Help needed to  walk in hospital room?: A Little Help needed climbing 3-5 steps with a railing? : Total 6 Click Score: 15    End of Session Equipment Utilized During Treatment: Oxygen (pt too large for gait belt) Activity Tolerance: Patient tolerated treatment well Patient left: with call bell/phone within reach;in bed;with bed alarm set Nurse Communication: Mobility status PT Visit Diagnosis: Muscle weakness (generalized) (M62.81);Difficulty in walking, not elsewhere classified (R26.2)     Time: 8118-8677 PT Time Calculation (min) (ACUTE ONLY): 18 min  Charges:  $Gait Training: 8-22 mins                     [place] Pager [phone] Office [place] 12/26/2019, 11:41 AM

## 2019-12-26 NOTE — Progress Notes (Signed)
ANTICOAGULATION - Follow Up Consult  Pharmacy Consult for Coumadin Indication: h/o DVT and new afib  Allergies  Allergen Reactions  . Codeine    Vital Signs: Temp: 97.6 F (36.4 C) (07/29 0755) Temp Source: Oral (07/29 0755) BP: 150/77 (07/29 0936) Pulse Rate: 65 (07/29 0936)  Labs: Recent Labs    12/24/19 0257 12/25/19 0449 12/25/19 0752 12/26/19 0254  HGB 9.0*  --  9.5*  --   HCT 29.1*  --  30.9*  --   PLT 300  --  315  --   LABPROT 29.0* 29.6*  --  29.0*  INR 2.9* 2.9*  --  2.8*  CREATININE 2.94* 2.89*  --  2.90*    Estimated Creatinine Clearance: 36.3 mL/min (A) (by C-G formula based on SCr of 2.9 mg/dL (H)).  Medical History: Past Medical History:  Diagnosis Date  . Epilepsy, grand mal ([place])   . Frozen shoulder    Right Shoulder  . Lumbago   . Peripheral vascular disease ([place])   . Stroke [facility])    Assessment: 64 y.o. M presented from [facility] on 7/17 with history of DVT and new onset atrial fibrillation. Patient on Coumadin PTA with home dose of 4 mg daily. Admission INR at [facility] 2.6. Patient was started on amiodarone during this admission which may potentiate the effects of warfarin. Patient is also being restarted on phenytoin 100mg  BID which has been held due to a supratherapeutic level (PTA dose was 300mg  BID). INR today was 2.8 which is within range.   Goal of Therapy:  INR 2-3 Monitor platelets by anticoagulation protocol: Yes   Plan:  Warfarin 0.5mg  x1  Monitor INR, monitor improved diet impact on therapy Monitor amiodarone scheduled therapy change on 7/30. Monitor phenytoin interaction with warfarin therapy.   Cephus Slater, PharmD, [facility] Resident [phone] 12/26/2019 10:36 AM

## 2019-12-27 DIAGNOSIS — L03119 Cellulitis of unspecified part of limb: Secondary | ICD-10-CM | POA: Diagnosis not present

## 2019-12-27 DIAGNOSIS — I4891 Unspecified atrial fibrillation: Secondary | ICD-10-CM | POA: Diagnosis not present

## 2019-12-27 DIAGNOSIS — G40A09 Absence epileptic syndrome, not intractable, without status epilepticus: Secondary | ICD-10-CM | POA: Diagnosis not present

## 2019-12-27 DIAGNOSIS — N179 Acute kidney failure, unspecified: Secondary | ICD-10-CM | POA: Diagnosis not present

## 2019-12-27 LAB — GLUCOSE, CAPILLARY
Glucose-Capillary: 149 mg/dL — ABNORMAL HIGH (ref 70–99)
Glucose-Capillary: 197 mg/dL — ABNORMAL HIGH (ref 70–99)

## 2019-12-27 LAB — PROTIME-INR
INR: 2.4 — ABNORMAL HIGH (ref 0.8–1.2)
Prothrombin Time: 25 seconds — ABNORMAL HIGH (ref 11.4–15.2)

## 2019-12-27 MED ORDER — HYDRALAZINE HCL 100 MG PO TABS: 100.0000 mg | ORAL_TABLET | Freq: Three times a day (TID) | ORAL | 0 refills | Status: DC

## 2019-12-27 MED ORDER — WARFARIN SODIUM 1 MG PO TABS: 1.0000 mg | ORAL_TABLET | Freq: Once | ORAL | Status: DC

## 2019-12-27 MED ORDER — ISOSORBIDE MONONITRATE ER 120 MG PO TB24: 120.0000 mg | ORAL_TABLET | Freq: Every day | ORAL | 0 refills | Status: DC

## 2019-12-27 MED ORDER — WARFARIN SODIUM 1 MG PO TABS: 1.0000 mg | ORAL_TABLET | Freq: Once | ORAL | 0 refills | Status: DC

## 2019-12-27 MED ORDER — AMIODARONE HCL 200 MG PO TABS: 200.0000 mg | ORAL_TABLET | Freq: Every day | ORAL | 0 refills | Status: DC

## 2019-12-27 MED ORDER — TORSEMIDE 20 MG PO TABS: 60.0000 mg | ORAL_TABLET | Freq: Every day | ORAL | 0 refills | Status: DC

## 2019-12-27 MED ORDER — PHENYTOIN SODIUM EXTENDED 100 MG PO CAPS: 100.0000 mg | ORAL_CAPSULE | Freq: Two times a day (BID) | ORAL | 0 refills | Status: DC

## 2019-12-27 MED ORDER — AMLODIPINE BESYLATE 10 MG PO TABS: 10.0000 mg | ORAL_TABLET | Freq: Every day | ORAL | 0 refills | Status: DC

## 2019-12-27 NOTE — Chronic Care Management (AMB) (Signed)
Chronic Care Management Pharmacy  Name: Matthew Gutierrez  MRN: [id number] DOB: [birth year]  Chief Complaint/ HPI    Matthew Gutierrez,  64 y.o. , male presents for their Follow-Up CCM visit with the clinical pharmacist via telephone due to COVID-19 Pandemic.  PCP : Lillard Anes, MD  Their chronic conditions include: HTN, COPD, Sleep Apnea, Atherosclerotic heart disease, DM, CKD, Hx of DVT/Stroke and bowel/urinary Incontinence.   Office Visits: 11/15/2019 - repeat shoulder injection due to bursitis/decreased ROM.  10/30/2019 - shoulder steroid injection given. Patient could not raise shoulder.   10/22/2019 - kenalog injection and percocet for adhesive capsulitis.   10/15/2019 - Chronic anemia, Glucose 289, kidney tests stable, low albumin, liver tests normal, triglycerides high at 290. A1c 8.9 we will continue to work on this, INR 1.9. 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 Recent gout sent him to the hospital.   10/04/2019 - pred pack 6 day.   07/25/2019 - Dr. Henrene Pastor incontinence of bladder and stool. Stop all bowel laxatives and magnesium. Referral to GI and Urology.   06/07/2019 - Patient seen for chronic disease visit. Has foot ulcer. Labs checked. CBC revealed chronic low RBC/HGB, CMP revealed GFR 43 and elevated Glucose/SCr,BUN. A1C - 8.4%. Lipid panel - TC 239, TG 418, LDL 124 HDL 41.   Consult Visit: 08/15/2019 - Dr. Lyda Jester recommended taking benefiber daily and miralax prn if no bowel movement by supper. Patient to record all BM and Miralax to report at follow-up visit in 6 weeks.   07/19/2019 - results for diabetic eye exam with no retinopathy.   Medications: No facility-administered encounter medications on file as of 12/05/2019.   Outpatient Encounter Medications as of 12/05/2019  Medication Sig  . allopurinol (ZYLOPRIM) 300 MG tablet Take 1 tablet (300 mg total) by mouth daily.  Marland Kitchen atenolol-chlorthalidone (TENORETIC) 100-25 MG tablet Take  1 tablet by mouth daily.  . fesoterodine (TOVIAZ) 4 MG TB24 tablet Take 4 mg by mouth daily.  . fluticasone (FLONASE) 50 MCG/ACT nasal spray Place 1 spray into both nostrils daily.  . furosemide (LASIX) 20 MG tablet Take 20 mg by mouth daily.  Marland Kitchen gabapentin (NEURONTIN) 300 MG capsule Take 1 capsule (300 mg total) by mouth at bedtime.  . hydrALAZINE (APRESOLINE) 10 MG tablet Take 2 tablets (20 mg total) by mouth 2 (two) times daily with a meal.  . lisinopril (ZESTRIL) 10 MG tablet Take 1 tablet (10 mg total) by mouth daily.  . metFORMIN (GLUCOPHAGE) 1000 MG tablet Take 1 tablet (1,000 mg total) by mouth 2 (two) times daily with a meal.  . oxyCODONE-acetaminophen (PERCOCET) 10-325 MG tablet Take 1 tablet by mouth every 6 (six) hours as needed for pain. (Patient not taking: Reported on 12/14/2019)  . pravastatin (PRAVACHOL) 40 MG tablet Take 1 tablet (40 mg total) by mouth daily.  Marland Kitchen triamcinolone cream (KENALOG) 0.1 % APPLY TO THE AFFECTED AREA(S) TWICE DAILY AS NEEDED (Patient taking differently: Apply 1 application topically 2 (two) times daily as needed (irritation). )  . warfarin (COUMADIN) 3 MG tablet Take 1 tablet (3 mg total) by mouth daily. (Patient not taking: Reported on 12/14/2019)  . warfarin (COUMADIN) 4 MG tablet Take 1 tablet (4 mg total) by mouth daily. (Patient taking differently: Take 4 mg by mouth daily at 4 PM. )  . [DISCONTINUED] clotrimazole-betamethasone (LOTRISONE) cream   . [DISCONTINUED] colchicine 0.6 MG tablet Take 0.6 mg by mouth daily.  . [DISCONTINUED] MITIGARE 0.6 MG CAPS   . [  DISCONTINUED] Omega-3 Fatty Acids (RA FISH OIL) 1000 MG CPDR Take by mouth.  . [DISCONTINUED] phenytoin (DILANTIN) 100 MG ER capsule Take 100 mg by mouth. Take three capsules twice daily  . [DISCONTINUED] Wheat Dextrin (BENEFIBER) POWD Take by mouth. Mix 1 tablespoonful in a drink once daily.     Current Diagnosis/Assessment:  Goals Addressed            This Visit's Progress   . Pharmacy  Care Plan       CARE PLAN ENTRY  Current Barriers:  . Chronic Disease Management support, education, and care coordination needs related to Hypertension, Hyperlipidemia, and Diabetes   Hypertension . Pharmacist Clinical Goal(s): o Over the next 90 days, patient will work with PharmD and providers to achieve BP goal <130/80 . Current regimen:  o atenolol-chlorthalidone 100-25 mg daily o furosemide 20 mg daily o hydralazine 10 mg 2tabs bid o lisinopril 10 mg daily . Interventions: o Recommend patient discuss elevated blood pressures with Dr. Henrene Pastor at next visit.  o Recommend patient work to improve diet for better bp control. Counseled on reducing breakfast meats.  . Patient self care activities - Over the next 90 days, patient will: o Check BP 90, document, and provide at future appointments o Ensure daily salt intake < 2300 mg/day  Hyperlipidemia . Pharmacist Clinical Goal(s): o Over the next 90 days, patient will work with PharmD and providers to maintain LDL goal < 70 . Current regimen:  o Pravastatin 40 mg daily . Interventions: o Recommend patient work to improve blood sugars to improve elevated TG.  . Patient self care activities - Over the next 90 days, patient will: o Patient will continue taking medications as prescribed and contact provider with any questions/concerns.   Diabetes . Pharmacist Clinical Goal(s): o Over the next 90 days, patient will work with PharmD and providers to achieve A1c goal <7% . Current regimen:  o Metformin 1000 mg bid . Interventions: o Recommend patient reduce carbohydrate and sugar intake in daily diet to improve fasting blood sugars.  o Discussed once patient is out of the hospital considering an additional diabetes medication for improved blood sugar control.  . Patient self care activities - Over the next 90 days, patient will: o Check blood sugar 2-3 times weekly, document, and provide at future appointments o Contact provider with  any episodes of hypoglycemia  Medication management . Pharmacist Clinical Goal(s): o Over the next 90 days, patient will work with PharmD and providers to maintain optimal medication adherence . Current pharmacy: [facility] . Interventions o Comprehensive medication review performed. o Continue current medication management strategy . Patient self care activities - Over the next 90 days, patient will: o Focus on medication adherence by continuing to use pill box.  o Take medications as prescribed o Report any questions or concerns to PharmD and/or provider(s)  Please see past updates related to this goal by clicking on the "Past Updates" button in the selected goal         Diabetes   Recent Relevant Labs: Lab Results  Component Value Date/Time   HGBA1C 8.3 (H) 12/14/2019 02:33 AM   HGBA1C 8.9 (H) 10/15/2019 10:14 AM   HGBA1C 8.4 06/07/2019 12:00 AM   HGBA1C 8.0 11/12/2018 12:00 AM   MICROALBUR 150 10/15/2019 10:05 AM   MICROALBUR 150 07/20/2017 12:00 AM     Checking BG: Weekly - patient says really painful to check so doing a few times weekly.   Recent FBG Readings:  140-150 mg/dL Patient has failed these medicatioin past: glimepiride Patient is currently uncontrolled on the following medications: Metformin 1000 mg bid  Last diabetic Foot exam: Dr. Henrene Pastor checks in office.  Last eye exam - Due now and has a card to make an appointment.   We discussed: how to recognize and treat signs of hypoglycemia. Home health nurse is also checking feet. Diarrhea issue could be improved from eating with Metformin.   Update 11/08/2019 - Blood sugar was 153 mg/dL yesterday am. Was 200 mg/dL when [facility] checked the other day. Drinks a lot of diet drinks and can't stay away from sugar. Loves potatoes and starch.   Update 12/05/2019 - Blood sugar is high. He is hospitalized currently. He is having some trembling that he wants Dr. Henrene Pastor to help him resolve this once he is discharged.  Patient is resistant to the idea of an injection but pharmacist would like to consider adding an additional treatment once he is discharged.   Plan Try taking metformin with food to avoid diarrhea.  Continue current medications,  Hypertension   Office blood pressures are  BP Readings from Last 3 Encounters:  12/27/19 (!) 142/70  11/15/19 (!) 170/80  10/30/19 (!) 150/90   Lab Results  Component Value Date   CREATININE 2.90 (H) 12/26/2019   CREATININE 2.89 (H) 12/25/2019   CREATININE 2.94 (H) 12/24/2019   Kidney Function Lab Results  Component Value Date/Time   CREATININE 2.90 (H) 12/26/2019 02:54 AM   CREATININE 2.89 (H) 12/25/2019 04:49 AM   GFRNONAA 22 (L) 12/26/2019 02:54 AM   GFRAA 25 (L) 12/26/2019 02:54 AM    Patient has failed these meds in the past: none that he can remember at this time.  Patient is currently uncontrolled on the following medications: atenolol-chlorthalidone 100-25 mg daily, furosemide 20 mg daily, hydralazine 10 mg 2tabs bid, lisinopril 10 mg daily  Patient checks BP at home several times per month  Patient home BP readings are ranging: 150/100  We discussed diet and exercise extensively. Patient admits he probably doesn't eat right. Eats bacon, sausage, eggs regularly. Admits he needs to be "off of sodium" due to legs swelling.   Update 11/08/2019: High blood pressure runs in his family. States it has always been high. He sees Dr. Henrene Pastor 06/14 and will discuss.   Update 12/05/2019 - Patient reports blood pressure is well controlled currently in hospital.   Plan  Continue current medications. Will try to cut back on sodium intake.   Hyperlipidemia/Hx of Stroke  Hx. Of stroke. Has had some stent.   Lipid Panel     Component Value Date/Time   CHOL 140 10/15/2019 1014   TRIG 290 (H) 10/15/2019 1014   HDL 34 (L) 10/15/2019 1014   CHOLHDL 4.1 10/15/2019 1014   LDLCALC 60 10/15/2019 1014   LABVLDL 46 (H) 10/15/2019 1014     Patient has  failed these meds in past: omega 3 fatty acids 1000 mg Patient is currently uncontrolled on the following medications: pravastatin 40 mg daily  We discussed:  diet and exercise extensively. We also discussed moving pravastatin to evening medications.   Update 11/08/2019: Recommend patient work to reduce fat sources in diet. Work to control blood sugar to improve TG.   Plan  Continue current medications    And Bowel/Urinary Incontinence:    Patient has failed these meds in past: laxatives, magnesium Patient is currently controlled on the following medications: Benefiber daily, Miralax PRN - haven't used yet, Toviaz samples  We discussed:  trying metofrmin with food to improve diarrhea. Discussing with urology about generic alternative for Lisbeth Ply if continued long-term. Lisbeth Ply is new to patient and MD provided samples for now.    Update 11/08/2019 - Patient is still dealing with urinary incontinence as well as bowel issues. He will follow-up with Dr. Nila Nephew soon for results of recent test.   Plan  Continue current medications    Gout   Kidney Function Lab Results  Component Value Date/Time   CREATININE 2.90 (H) 12/26/2019 02:54 AM   CREATININE 2.89 (H) 12/25/2019 04:49 AM   GFRNONAA 22 (L) 12/26/2019 02:54 AM   GFRAA 25 (L) 12/26/2019 02:54 AM     Patient has failed these meds in past: colchicine Patient is currently uncontrolled on the following medications:  . Allopurinol 300 mg daily  We discussed 11/08/2019:  Patient reports that medication seems to be improving symptoms although still having discomfort in shoulders and arms.   Plan  Continue current medications   Medication Management   Pt uses [facility] for all medications Using weekly pill box. Discussed packaging option but patient does not feel needed at this time.  Pt endorses good compliance  We discussed:  Continuing to use medication organizer to keep with good compliance. Patient is  very cautious taking phenytoin as directed to avoid a seizure.   Plan  Continue current medication regimen.     Follow up: 1 month

## 2019-12-27 NOTE — Progress Notes (Signed)
ANTICOAGULATION - Follow Up Consult  Pharmacy Consult for Coumadin Indication: h/o DVT and new afib  Allergies  Allergen Reactions  . Codeine    Vital Signs: Temp: 98.3 F (36.8 C) (07/30 0735) Temp Source: Oral (07/30 0735) BP: 154/77 (07/30 0735) Pulse Rate: 65 (07/30 0735)  Labs: Recent Labs    12/25/19 0449 12/25/19 0752 12/26/19 0254 12/27/19 0455  HGB  --  9.5*  --   --   HCT  --  30.9*  --   --   PLT  --  315  --   --   LABPROT 29.6*  --  29.0* 25.0*  INR 2.9*  --  2.8* 2.4*  CREATININE 2.89*  --  2.90*  --     Estimated Creatinine Clearance: 36 mL/min (A) (by C-G formula based on SCr of 2.9 mg/dL (H)).  Medical History: Past Medical History:  Diagnosis Date  . Epilepsy, grand mal ([place])   . Frozen shoulder    Right Shoulder  . Lumbago   . Peripheral vascular disease ([place])   . Stroke [facility])    Assessment: 64 y.o. M presented from [facility] on 7/17 with history of DVT and new onset atrial fibrillation. Patient on Coumadin PTA with home dose of 4 mg daily. Admission [facility] at [facility] 2.6. Patient was started on amiodarone during this admission which may potentiate the effects of warfarin. Patient is also being restarted on phenytoin 100mg  BID which has been held due to a supratherapeutic level (PTA dose was 300mg  BID). INR today was 2.4 which is within range. Per outpatient physician notes patients previous goal was 1.5-2.5. His new onset atrial fibrillation would increase his goal to 2-3.    Goal of Therapy:  INR 2-3 Monitor platelets by anticoagulation protocol: Yes   Plan:  Increase warfarin to 1mg  x1 due to declining INR and interacting medications. Monitor INR, monitor improved diet impact on therapy Monitor amiodarone scheduled therapy change on 7/30. Monitor phenytoin interaction with warfarin therapy.  Speak with patient about warfarin hx.   Cephus Slater, PharmD, MBA Pharmacy Resident [phone] 12/27/2019 9:20 AM

## 2019-12-27 NOTE — TOC Transition Note (Signed)
Transition of Care [facility]) - CM/SW Discharge Note   Patient Details  Name: [name] MRN: [id number] Date of Birth: Jan 11, 1956  Transition of Care [facility]) CM/SW Contact:  Vinie Sill, [place] Phone Number: 12/27/2019, 12:06 PM   Clinical Narrative:     Patient will DC to: [place] Date: 12/27/2019 Family Notified: patient declined,states she has informed his sister,Beverly Transport By: Corey Harold   Per MD patient is ready for discharge. RN, patient, and facility notified of DC. Discharge Summary sent to facility. RN given number for [phone]. Ambulance transport requested for patient.   Clinical Social Worker signing off.  Thurmond Butts, MSW, [place] Clinical Social Worker    Final next level of care: Skilled Nursing Facility Barriers to Discharge: Barriers Resolved   Patient Goals and CMS Choice Patient states their goals for this hospitalization and ongoing recovery are:: to go to SNF CMS Medicare.gov Compare Post Acute Care list provided to:: Patient Choice offered to / list presented to : Patient  Discharge Placement              Patient chooses bed at:  [facility]) Patient to be transferred to facility by: [place] Name of family member notified: patient declined- states he has informed his sister,Beverly Patient and family notified of of transfer: 12/27/19  Discharge Plan and Services                                     Social Determinants of Health (SDOH) Interventions     Readmission Risk Interventions No flowsheet data found.

## 2019-12-27 NOTE — Progress Notes (Signed)
Report given to RN at [facility]. IV D/C'd. Patient dressed and belongings and discharge paperwork given to [facility].

## 2019-12-27 NOTE — Patient Instructions (Addendum)
Visit Information  Goals Addressed            This Visit's Progress   . Pharmacy Care Plan       CARE PLAN ENTRY  Current Barriers:  . Chronic Disease Management support, education, and care coordination needs related to Hypertension, Hyperlipidemia, and Diabetes   Hypertension . Pharmacist Clinical Goal(s): o Over the next 90 days, patient will work with PharmD and providers to achieve BP goal <130/80 . Current regimen:  o atenolol-chlorthalidone 100-25 mg daily o furosemide 20 mg daily o hydralazine 10 mg 2tabs bid o lisinopril 10 mg daily . Interventions: o Recommend patient discuss elevated blood pressures with Dr. Henrene Pastor at next visit.  o Recommend patient work to improve diet for better bp control. Counseled on reducing breakfast meats.  . Patient self care activities - Over the next 90 days, patient will: o Check BP 90, document, and provide at future appointments o Ensure daily salt intake < 2300 mg/day  Hyperlipidemia . Pharmacist Clinical Goal(s): o Over the next 90 days, patient will work with PharmD and providers to maintain LDL goal < 70 . Current regimen:  o Pravastatin 40 mg daily . Interventions: o Recommend patient work to improve blood sugars to improve elevated TG.  . Patient self care activities - Over the next 90 days, patient will: o Patient will continue taking medications as prescribed and contact provider with any questions/concerns.   Diabetes . Pharmacist Clinical Goal(s): o Over the next 90 days, patient will work with PharmD and providers to achieve A1c goal <7% . Current regimen:  o Metformin 1000 mg bid . Interventions: o Recommend patient reduce carbohydrate and sugar intake in daily diet to improve fasting blood sugars.  o Discussed once patient is out of the hospital considering an additional diabetes medication for improved blood sugar control.  . Patient self care activities - Over the next 90 days, patient will: o Check blood sugar  2-3 times weekly, document, and provide at future appointments o Contact provider with any episodes of hypoglycemia  Medication management . Pharmacist Clinical Goal(s): o Over the next 90 days, patient will work with PharmD and providers to maintain optimal medication adherence . Current pharmacy: [facility] . Interventions o Comprehensive medication review performed. o Continue current medication management strategy . Patient self care activities - Over the next 90 days, patient will: o Focus on medication adherence by continuing to use pill box.  o Take medications as prescribed o Report any questions or concerns to PharmD and/or provider(s)  Please see past updates related to this goal by clicking on the "Past Updates" button in the selected goal         The patient verbalized understanding of instructions provided today and declined a print copy of patient instruction materials.   Telephone follow up appointment with pharmacy team member scheduled for:12/2019  Sherre Poot, PharmD, [facility] Clinical Pharmacist [facility] [phone] (office) [phone] (mobile)  Dulaglutide injection What is this medicine? DULAGLUTIDE (DOO la GLOO tide) is used to improve blood sugar control in adults with type 2 diabetes. This medicine may be used with other oral diabetes medicines. This drug may also reduce the risk of heart attack or stroke if you have type 2 diabetes and risk factors for heart disease. This medicine may be used for other purposes; ask your health care provider or pharmacist if you have questions. COMMON BRAND NAME(S): Trulicity What should I tell my health care provider before I  take this medicine? They need to know if you have any of these conditions:  endocrine tumors (MEN 2) or if someone in your family had these tumors  eye disease, vision problems  history of pancreatitis  kidney disease  liver disease  stomach problems  thyroid  cancer or if someone in your family had thyroid cancer  an unusual or allergic reaction to dulaglutide, other medicines, foods, dyes, or preservatives  pregnant or trying to get pregnant  breast-feeding How should I use this medicine? This medicine is for injection under the skin of your upper leg (thigh), stomach area, or upper arm. It is usually given once every week (every 7 days). You will be taught how to prepare and give this medicine. Use exactly as directed. Take your medicine at regular intervals. Do not take it more often than directed. If you use this medicine with insulin, you should inject this medicine and the insulin separately. Do not mix them together. Do not give the injections right next to each other. Change (rotate) injection sites with each injection. It is important that you put your used needles and syringes in a special sharps container. Do not put them in a trash can. If you do not have a sharps container, call your pharmacist or healthcare provider to get one. A special MedGuide will be given to you by the pharmacist with each prescription and refill. Be sure to read this information carefully each time. This drug comes with INSTRUCTIONS FOR USE. Ask your pharmacist for directions on how to use this drug. Read the information carefully. Talk to your pharmacist or health care provider if you have questions. Talk to your pediatrician regarding the use of this medicine in children. Special care may be needed. Overdosage: If you think you have taken too much of this medicine contact a poison control center or emergency room at once. NOTE: This medicine is only for you. Do not share this medicine with others. What if I miss a dose? If you miss a dose, take it as soon as you can within 3 days after the missed dose. Then take your next dose at your regular weekly time. If it has been longer than 3 days after the missed dose, do not take the missed dose. Take the next dose at your  regular time. Do not take double or extra doses. If you have questions about a missed dose, contact your health care provider for advice. What may interact with this medicine?  other medicines for diabetes Many medications may cause changes in blood sugar, these include:  alcohol containing beverages  antiviral medicines for HIV or AIDS  aspirin and aspirin-like drugs  certain medicines for blood pressure, heart disease, irregular heart beat  chromium  diuretics  male hormones, such as estrogens or progestins, birth control pills  fenofibrate  gemfibrozil  isoniazid  lanreotide  male hormones or anabolic steroids  MAOIs like Carbex, Eldepryl, Marplan, Nardil, and Parnate  medicines for weight loss  medicines for allergies, asthma, cold, or cough  medicines for depression, anxiety, or psychotic disturbances  niacin  nicotine  NSAIDs, medicines for pain and inflammation, like ibuprofen or naproxen  octreotide  pasireotide  pentamidine  phenytoin  probenecid  quinolone antibiotics such as ciprofloxacin, levofloxacin, ofloxacin  some herbal dietary supplements  steroid medicines such as prednisone or cortisone  sulfamethoxazole; trimethoprim  thyroid hormones Some medications can hide the warning symptoms of low blood sugar (hypoglycemia). You may need to monitor your blood sugar more  closely if you are taking one of these medications. These include:  beta-blockers, often used for high blood pressure or heart problems (examples include atenolol, metoprolol, propranolol)  clonidine  guanethidine  reserpine This list may not describe all possible interactions. Give your health care provider a list of all the medicines, herbs, non-prescription drugs, or dietary supplements you use. Also tell them if you smoke, drink alcohol, or use illegal drugs. Some items may interact with your medicine. What should I watch for while using this medicine? Visit  your doctor or health care professional for regular checks on your progress. Drink plenty of fluids while taking this medicine. Check with your doctor or health care professional if you get an attack of severe diarrhea, nausea, and vomiting. The loss of too much body fluid can make it dangerous for you to take this medicine. A test called the HbA1C (A1C) will be monitored. This is a simple blood test. It measures your blood sugar control over the last 2 to 3 months. You will receive this test every 3 to 6 months. Learn how to check your blood sugar. Learn the symptoms of low and high blood sugar and how to manage them. Always carry a quick-source of sugar with you in case you have symptoms of low blood sugar. Examples include hard sugar candy or glucose tablets. Make sure others know that you can choke if you eat or drink when you develop serious symptoms of low blood sugar, such as seizures or unconsciousness. They must get medical help at once. Tell your doctor or health care professional if you have high blood sugar. You might need to change the dose of your medicine. If you are sick or exercising more than usual, you might need to change the dose of your medicine. Do not skip meals. Ask your doctor or health care professional if you should avoid alcohol. Many nonprescription cough and cold products contain sugar or alcohol. These can affect blood sugar. Pens should never be shared. Even if the needle is changed, sharing may result in passing of viruses like hepatitis or HIV. Wear a medical ID bracelet or chain, and carry a card that describes your disease and details of your medicine and dosage times. What side effects may I notice from receiving this medicine? Side effects that you should report to your doctor or health care professional as soon as possible:  allergic reactions like skin rash, itching or hives, swelling of the face, lips, or tongue  breathing problems  changes in  vision  diarrhea that continues or is severe  lump or swelling on the neck  severe nausea  signs and symptoms of infection like fever or chills; cough; sore throat; pain or trouble passing urine  signs and symptoms of low blood sugar such as feeling anxious, confusion, dizziness, increased hunger, unusually weak or tired, sweating, shakiness, cold, irritable, headache, blurred vision, fast heartbeat, loss of consciousness  signs and symptoms of kidney injury like trouble passing urine or change in the amount of urine  trouble swallowing  unusual stomach upset or pain  vomiting Side effects that usually do not require medical attention (report to your doctor or health care professional if they continue or are bothersome):  diarrhea  loss of appetite  nausea  pain, redness, or irritation at site where injected  stomach upset This list may not describe all possible side effects. Call your doctor for medical advice about side effects. You may report side effects to FDA at 1-800-FDA-1088. Where  should I keep my medicine? Keep out of the reach of children. Store unopened pens in a refrigerator between 2 and 8 degrees C (36 and 46 degrees F). Do not freeze or use if the medicine has been frozen. Protect from light and excessive heat. Store in the carton until use. Each single-dose pen can be kept at room temperature, not to exceed 30 degrees C (86 degrees F) for a total of 14 days, if needed. Throw away any unused medicine after the expiration date on the label. NOTE: This sheet is a summary. It may not cover all possible information. If you have questions about this medicine, talk to your doctor, pharmacist, or health care provider.  2020 [employer]/Gold Standard (2019-01-29 09:34:53)

## 2019-12-27 NOTE — Progress Notes (Signed)
Orthopedic Tech Progress Note Patient Details:  Matthew Gutierrez Mar 27, 1956 [id number]  Ortho Devices Type of Ortho Device: Haematologist Ortho Device/Splint Location: BLE Ortho Device/Splint Interventions: Ordered   Post Interventions Patient Tolerated: Well Instructions Provided: Care of device   Carlena Ruybal 12/27/2019, 1:35 PM

## 2019-12-27 NOTE — Discharge Summary (Addendum)
Physician Discharge Summary  Matthew Gutierrez NID:[id number] DOB: 22-Aug-1955 DOA: 12/14/2019  PCP: Lillard Anes, MD  Admit date: 12/14/2019 Discharge date: 12/27/2019  Admitted From: Home  Disposition:  SNF   Recommendations for Outpatient Follow-up and new medication changes:  1. Follow up with Dr. Henrene Pastor in 7 days.  2. Discontinue lisinopril 3. Increased hydralazine dose to 100 mg tid, added isosorbide and amlodipine 4. Patient placed on amiodarone. 5. Follow up BMP in 7 days.  6. Continue diuresis with 60 mg daily of torsemide. 7. Follow up INR in 3 days, target 2 to 3.    Home Health: na   Equipment/Devices: na    Discharge Condition: stable  CODE STATUS: full  Diet recommendation: hearth healthy and diabetic prudent,   Brief/Interim Summary: Patient was transferred from [facility] 07/17,for acute kidney injury on chronic disease stage IIIa.  64 year old male who was admitted to [facility] on July 7 for left lower extremity cellulitis. He does have the past medical history of DVT, lymphedema, obesity, peripheral vascular disease, epilepsy, hypertension and chronic kidney disease stage IIIa. Patient was admitted to the medical ward, he was placed on intravenous clindamycin and then transitioned to cefazolin and cefepime. He had worsening kidney function, his serum creatinine reached 4.8, his antibiotics were discontinued. Patient received intravenous fluids, intravenous bicarbonate, intravenous calcium, furosemide andoral Kayexalate. During his hospitalization he developed atrial fibrillation with rapid ventricular response, he received IV diltiazem, carvedilol and amiodarone. At the time of transfer his blood pressure was 190/77, heart rate 76, respiratory rate 20, temperature 98.4, oxygen saturation 95%. He had moist mucous membranes, lungs clear to auscultation bilaterally, heart S1-S2, present rhythmic, his abdomen was protuberant, positive  bilateral lower extremity edema, positive erythema distally, dressings in place. Sodium 135, potassium 4.3, chloride 89, bicarb 33, glucose 195, BUN 52, creatinine 2.45, white count 14.9, hemoglobin 9.3, hematocrit 30.1, platelets 277. SARS COVID-19 negative.  Patient had close monitoring of renal function, eventually started on diuresis for acute on chronic diastolic heart failure exacerbation  1.  Acute kidney injury on chronic kidney disease stage IIIa, hypomagnesemia/hyperkalemia.  Patient received supportive medical therapy, nephrotoxic agents were discontinued.  He had a prolonged hospitalization, his discharge creatinine is 2.9, sodium 137, potassium 3.6 and serum bicarbonate 34 with a BUN of 54.  Patient will continue taking torsemide to keep negative fluid balance, will recommend follow-up kidney function within 7 days.  2.  Atrial fibrillation with rapid ventricular response.  Patient required multiple AV blocking agents, eventually treated with amiodarone good response.  At discharge his 12-lead electrocardiogram showed normal sinus rhythm. Continue anticoagulation with warfarin.  His discharge INR is 2.4.  3.  Acute on chronic diastolic heart failure, complicated with acute on chronic hypoxic respiratory failure.  Patient received diuresis with good response, negative fluid balance was achieved (since admission 9,425.8 mL negative fluid balance) with significant improvement of his symptoms.  Patient will continue heart failure management with hydralazine, isosorbide and blood pressure control with amlodipine.  Holding ACE in nature due to risk of worsening kidney injury and hyperkalemia.  No beta-blockade because patient on amiodarone, and risk of bradycardia.  4.  Hypertension.  Continue blood pressure control with amlodipine, hydralazine and isosorbide.  5.  Left lower extremity cellulitis, chronic lymphedema.  Patient completed antibiotic therapy during his hospitalization.  He  has chronic lower extremity edema, currently with compression dressings bilaterally.  6.  COPD.  No signs of acute exacerbation.  7.  History of DVT.  Continue  warfarin, INR is therapeutic at discharge.  8.  History of seizures.  Continue phenytoin, he received, clonazepam during his hospitalization.  9.  Uncontrolled type 2 diabetes mellitus, hemoglobin A1c 8.3/dyslipidemia.  Patient received insulin therapy during his hospitalization, at discharge will resume Metformin, close follow-up as an outpatient.  Continue pravastatin.  10.  Obesity class III, calculated BMI 40.6, he needs aggressive lifestyle modifications.  Follow as an outpatient.  Discharge Diagnoses:  Principal Problem:   Acute renal failure superimposed on stage 3 chronic kidney disease (HCC) Active Problems:   Essential hypertension   History of DVT (deep vein thrombosis)   Absence epileptic syndrome, not intractable, without status epilepticus (HCC)   COPD (chronic obstructive pulmonary disease) (HCC)   Chronic respiratory failure with hypoxia (HCC)   Cellulitis of leg   Atrial fibrillation with RVR ([place])    Discharge Instructions   Allergies as of 12/27/2019      Reactions   Codeine       Medication List    STOP taking these medications   atenolol-chlorthalidone 100-25 MG tablet Commonly known as: TENORETIC   furosemide 20 MG tablet Commonly known as: LASIX   lisinopril 10 MG tablet Commonly known as: ZESTRIL   oxyCODONE-acetaminophen 10-325 MG tablet Commonly known as: PERCOCET     TAKE these medications   allopurinol 300 MG tablet Commonly known as: ZYLOPRIM Take 1 tablet (300 mg total) by mouth daily.   amiodarone 200 MG tablet Commonly known as: PACERONE Take 1 tablet (200 mg total) by mouth daily. Start taking on: December 28, 2019   amLODipine 10 MG tablet Commonly known as: NORVASC Take 1 tablet (10 mg total) by mouth daily. Start taking on: December 28, 2019   fluticasone 50 MCG/ACT  nasal spray Commonly known as: FLONASE Place 1 spray into both nostrils daily.   gabapentin 300 MG capsule Commonly known as: NEURONTIN Take 1 capsule (300 mg total) by mouth at bedtime.   hydrALAZINE 100 MG tablet Commonly known as: APRESOLINE Take 1 tablet (100 mg total) by mouth every 8 (eight) hours. What changed:   medication strength  how much to take  when to take this   isosorbide mononitrate 120 MG 24 hr tablet Commonly known as: IMDUR Take 1 tablet (120 mg total) by mouth daily. Start taking on: December 28, 2019   metFORMIN 1000 MG tablet Commonly known as: GLUCOPHAGE Take 1 tablet (1,000 mg total) by mouth 2 (two) times daily with a meal.   phenytoin 100 MG ER capsule Commonly known as: DILANTIN Take 1 capsule (100 mg total) by mouth 2 (two) times daily.   pravastatin 40 MG tablet Commonly known as: PRAVACHOL Take 1 tablet (40 mg total) by mouth daily.   torsemide 20 MG tablet Commonly known as: DEMADEX Take 3 tablets (60 mg total) by mouth daily. Start taking on: December 28, 2019   Toviaz 4 MG Tb24 tablet Generic drug: fesoterodine Take 4 mg by mouth daily.   triamcinolone cream 0.1 % Commonly known as: KENALOG APPLY TO THE AFFECTED AREA(S) TWICE DAILY AS NEEDED What changed: See the new instructions.   warfarin 1 MG tablet Commonly known as: COUMADIN Take 1 tablet (1 mg total) by mouth one time only at 4 PM. What changed:   medication strength  how much to take  when to take this  Another medication with the same name was removed. Continue taking this medication, and follow the directions you see here.  Discharge Care Instructions  (From admission, onward)         Start     Ordered   12/27/19 0000  Discharge wound care:       Comments: Wound care to bilateral LEs on "Sundays and Wednesdays: Remove Unna's Boots, wash and dry LEs with soap and water, rinse and dry thoroughly. Obtain four 4-inch coban dressings.  Wound care to  left foot second digit. Paint eschar with betadine swabstick and allow to air dry. No dressing.   12/27/19 1047          Follow-up Information    Perry, Lawrence Edward, MD Follow up in 1 week(s).   Specialty: Family Medicine Contact information: [address] [place] [address] [phone]              Allergies  Allergen Reactions   Codeine     Consultations:  Nephrology   Cardiology    Procedures/Studies: US RENAL  Result Date: 12/21/2019 CLINICAL DATA:  Acute kidney injury, stage 3 chronic kidney disease EXAM: RENAL / URINARY TRACT ULTRASOUND COMPLETE COMPARISON:  12/09/2019 FINDINGS: Right Kidney: Renal measurements: 13.0 x 5.0 x 6.3 cm = volume: 205 mL . Echogenicity within normal limits. No mass or hydronephrosis visualized. Left Kidney: Renal measurements: 11.7 x 5.1 x 5.3 cm = volume: 164 mL. Echogenicity within normal limits. No mass or hydronephrosis visualized. Bladder: Appears normal for degree of bladder distention. Other: None. IMPRESSION: Normal renal sonogram Electronically Signed   By: Ashesh  Parikh MD   On: 12/21/2019 22:50   ECHOCARDIOGRAM COMPLETE  Result Date: 12/15/2019    ECHOCARDIOGRAM REPORT   Patient Name:   Matthew Gutierrez Date of Exam: 12/15/2019 Medical Rec #:  [id number]      Height:       72.0 in Accession #:    [id number]     Weight:       322.0 lb Date of Birth:  02/16/1956      BSA:          2.609 m Patient Age:    63 years       BP:           171/62 mmHg Patient Gender: M              HR:           65"  bpm. Exam Location:  Inpatient Procedure: 2D Echo, Cardiac Doppler, Color Doppler and Intracardiac            Opacification Agent Indications:    Atrial Fibrillation 427.31 / I48.91  History:        Patient has no prior history of Echocardiogram examinations.                 Previous Myocardial Infarction, COPD and Stroke,                 Arrythmias:Atrial Fibrillation; Risk Factors:Sleep Apnea and                 Former Smoker.  PVD. DVT.  Sonographer:    Vickie Epley RDCS Referring Phys: [phone] TRACI R TURNER  Sonographer Comments: Patient is morbidly obese. Image acquisition challenging due to patient body habitus. IMPRESSIONS  1. Poor acoustic windows limit study Left ventricular ejection fraction, by estimation, is 60 to 65%. The left ventricle has normal function. Left ventricular endocardial border not optimally defined to evaluate regional wall motion. The left ventricular internal cavity size was mildly dilated. Left ventricular  diastolic function could not be evaluated.  2. Right ventricular systolic function is grossly normal. The right ventricular size is normal.  3. Pericardial effusion is mainly posterior to LV 14 mm maximal dimenstion. No evid of filling compromise. . Moderate pericardial effusion.  4. The mitral valve is normal in structure. No evidence of mitral valve regurgitation.  5. The aortic valve is tricuspid. Aortic valve regurgitation is not visualized. Mild aortic valve sclerosis is present, with no evidence of aortic valve stenosis.  6. The inferior vena cava is dilated in size with >50% respiratory variability, suggesting right atrial pressure of 8 mmHg. FINDINGS  Left Ventricle: Poor acoustic windows limit study Cannot evaluate regional wall motion. Left ventricular ejection fraction, by estimation, is 60 to 65%. The left ventricle has normal function. Left ventricular endocardial border not optimally defined to  evaluate regional wall motion. The left ventricular internal cavity size was mildly dilated. There is no left ventricular hypertrophy. Left ventricular diastolic function could not be evaluated. Right Ventricle: The right ventricular size is normal. Right vetricular wall thickness was not assessed. Right ventricular systolic function is normal. Left Atrium: Left atrial size was normal in size. Right Atrium: Right atrial size was not well visualized. Pericardium: Pericardial effusion is mainly posterior to  LV 14 mm maximal dimenstion. No evid of filling compromise. A moderately sized pericardial effusion is present. Mitral Valve: The mitral valve is normal in structure. No evidence of mitral valve regurgitation. Tricuspid Valve: The tricuspid valve is normal in structure. Tricuspid valve regurgitation is trivial. Aortic Valve: The aortic valve is tricuspid. Aortic valve regurgitation is not visualized. Mild aortic valve sclerosis is present, with no evidence of aortic valve stenosis. Pulmonic Valve: The pulmonic valve was grossly normal. Pulmonic valve regurgitation is not visualized. Aorta: The aortic root is normal in size and structure. Venous: The inferior vena cava is dilated in size with greater than 50% respiratory variability, suggesting right atrial pressure of 8 mmHg. IAS/Shunts: The interatrial septum was not assessed.  LEFT VENTRICLE PLAX 2D LVIDd:         5.20 cm LVIDs:         3.20 cm LV PW:         1.00 cm LV IVS:        1.00 cm LVOT diam:     2.40 cm LVOT Area:     4.52 cm  LEFT ATRIUM           Index LA diam:      4.40 cm 1.69 cm/m LA Vol (A4C): 21.7 ml 8.32 ml/m   AORTA Ao Root diam: 3.70 cm Ao Asc diam:  3.70 cm MITRAL VALVE MV Area (PHT): 3.45 cm    SHUNTS MV Decel Time: 220 msec    Systemic Diam: 2.40 cm MV E velocity: 43.50 cm/s MV A velocity: 43.90 cm/s MV E/A ratio:  0.99 Dorris Carnes MD Electronically signed by Dorris Carnes MD Signature Date/Time: 12/15/2019/4:15:31 PM    Final         Subjective: Patient is feeling better, continue to be very weak and deconditioned, no worsening dyspnea or lower extremity edema.   Discharge Exam: Vitals:   12/27/19 0333 12/27/19 0735  BP: (!) 161/79 (!) 154/77  Pulse: 70 65  Resp: 20 18  Temp: 97.7 F (36.5 C) 98.3 F (36.8 C)  SpO2: 96% 100%   Vitals:   12/26/19 1900 12/26/19 2321 12/27/19 0333 12/27/19 0735  BP: (!) 164/91 (!) 134/58 (!) 161/79 (!) 154/77  Pulse: 69 66 70 65  Resp: 20 18 20 18   Temp: 98.2 F (36.8 C) 97.6 F (36.4  C) 97.7 F (36.5 C) 98.3 F (36.8 C)  TempSrc: Oral Oral Oral Oral  SpO2: 98% 100% 96% 100%  Weight:   (!) 134.3 kg   Height:        General: Not in pain or dyspnea, Neurology: Awake and alert, non focal  E ENT: no pallor, no icterus, oral mucosa moist Cardiovascular: No JVD. S1-S2 present, rhythmic, no gallops, rubs, or murmurs. No lower extremity edema. Pulmonary: positive breath sounds bilaterally, no wheezing, or rhonchi, positive bibasilar rales. Gastrointestinal. Abdomen protuberant, soft and non tender Skin. No rashes Musculoskeletal: no joint deformities   The results of significant diagnostics from this hospitalization (including imaging, microbiology, ancillary and laboratory) are listed below for reference.     Microbiology: Recent Results (from the past 240 hour(s))  SARS CORONAVIRUS 2 (TAT 6-24 HRS) Nasopharyngeal Nasopharyngeal Swab     Status: None   Collection Time: 12/19/19  6:00 PM   Specimen: Nasopharyngeal Swab  Result Value Ref Range Status   SARS Coronavirus 2 NEGATIVE NEGATIVE Final    Comment: (NOTE) SARS-CoV-2 target nucleic acids are NOT DETECTED.  The SARS-CoV-2 RNA is generally detectable in upper and lower respiratory specimens during the acute phase of infection. Negative results do not preclude SARS-CoV-2 infection, do not rule out co-infections with other pathogens, and should not be used as the sole basis for treatment or other patient management decisions. Negative results must be combined with clinical observations, patient history, and epidemiological information. The expected result is Negative.  Fact Sheet for Patients: SugarRoll.be  Fact Sheet for Healthcare Providers: https://www.woods-mathews.com/  This test is not yet approved or cleared by the [place] FDA and  has been authorized for detection and/or diagnosis of SARS-CoV-2 by FDA under an Emergency Use Authorization (EUA). This  EUA will remain  in effect (meaning this test can be used) for the duration of the COVID-19 declaration under Se ction 564(b)(1) of the Act, 21 U.S.C. section 360bbb-3(b)(1), unless the authorization is terminated or revoked sooner.  Performed at [facility], [place] [address] [place], [place] [address]   SARS Coronavirus 2 by RT PCR (hospital order, performed in [facility] hospital lab) Nasopharyngeal Nasopharyngeal Swab     Status: None   Collection Time: 12/26/19  3:28 PM   Specimen: Nasopharyngeal Swab  Result Value Ref Range Status   SARS Coronavirus 2 NEGATIVE NEGATIVE Final    Comment: (NOTE) SARS-CoV-2 target nucleic acids are NOT DETECTED.  The SARS-CoV-2 RNA is generally detectable in upper and lower respiratory specimens during the acute phase of infection. The lowest concentration of SARS-CoV-2 viral copies this assay can detect is 250 copies / mL. A negative result does not preclude SARS-CoV-2 infection and should not be used as the sole basis for treatment or other patient management decisions.  A negative result may occur with improper specimen collection / handling, submission of specimen other than nasopharyngeal swab, presence of viral mutation(s) within the areas targeted by this assay, and inadequate number of viral copies (<250 copies / mL). A negative result must be combined with clinical observations, patient history, and epidemiological information.  Fact Sheet for Patients:   StrictlyIdeas.no  Fact Sheet for Healthcare Providers: BankingDealers.co.za  This test is not yet approved or  cleared by the [place] FDA and has been authorized for detection and/or diagnosis of SARS-CoV-2 by FDA under an Emergency  Use Authorization (EUA).  This EUA will remain in effect (meaning this test can be used) for the duration of the COVID-19 declaration under Section 564(b)(1) of the Act, 21 U.S.C. section  360bbb-3(b)(1), unless the authorization is terminated or revoked sooner.  Performed at [facility], [place] [address], [place], [place] [address]      Labs: BNP (last 3 results) Recent Labs    12/15/19 0359  BNP 235.3*   Basic Metabolic Panel: Recent Labs  Lab 12/21/19 0345 12/21/19 0345 12/22/19 0405 12/23/19 0241 12/24/19 0257 12/25/19 0449 12/26/19 0254  NA 134*   < > 134* 133* 133* 136 137  K 3.9   < > 4.1 4.1 3.8 3.7 3.6  CL 86*   < > 87* 85* 86* 88* 89*  CO2 34*   < > 30 33* 32 31 34*  GLUCOSE 167*   < > 165* 180* 225* 156* 155*  BUN 55*   < > 54* 57* 57* 56* 54*  CREATININE 2.90*   < > 2.87* 3.08* 2.94* 2.89* 2.90*  CALCIUM 8.8*   < > 8.9 8.9 8.7* 8.8* 8.8*  MG 1.5*  --   --  1.4* 1.7 1.6*  --    < > = values in this interval not displayed.   Liver Function Tests: Recent Labs  Lab 12/22/19 0832 12/23/19 0241 12/24/19 0257 12/25/19 0449  ALBUMIN 2.7* 2.7* 2.7* 2.8*   No results for input(s): LIPASE, AMYLASE in the last 168 hours. No results for input(s): AMMONIA in the last 168 hours. CBC: Recent Labs  Lab 12/21/19 0345 12/23/19 0241 12/24/19 0257 12/25/19 0752  WBC 11.2* 11.3* 11.8* 11.6*  NEUTROABS 9.0* 9.1*  --   --   HGB 9.3* 9.0* 9.0* 9.5*  HCT 30.6* 29.5* 29.1* 30.9*  MCV 93.0 91.9 93.0 93.6  PLT 290 303 300 315   Cardiac Enzymes: No results for input(s): CKTOTAL, CKMB, CKMBINDEX, TROPONINI in the last 168 hours. BNP: Invalid input(s): POCBNP CBG: Recent Labs  Lab 12/26/19 0618 12/26/19 1116 12/26/19 1623 12/26/19 2056 12/27/19 0602  GLUCAP 146* 211* 202* 168* 149*   D-Dimer No results for input(s): DDIMER in the last 72 hours. Hgb A1c No results for input(s): HGBA1C in the last 72 hours. Lipid Profile No results for input(s): CHOL, HDL, LDLCALC, TRIG, CHOLHDL, LDLDIRECT in the last 72 hours. Thyroid function studies No results for input(s): TSH, T4TOTAL, T3FREE, THYROIDAB in the last 72 hours.  Invalid input(s):  FREET3 Anemia work up No results for input(s): VITAMINB12, FOLATE, FERRITIN, TIBC, IRON, RETICCTPCT in the last 72 hours. Urinalysis No results found for: COLORURINE, APPEARANCEUR, [place], [place], [place], [place], [place], [place], PROTEINUR, UROBILINOGEN, NITRITE, LEUKOCYTESUR Sepsis Labs Invalid input(s): PROCALCITONIN,  WBC,  LACTICIDVEN Microbiology Recent Results (from the past 240 hour(s))  SARS CORONAVIRUS 2 (TAT 6-24 HRS) Nasopharyngeal Nasopharyngeal Swab     Status: None   Collection Time: 12/19/19  6:00 PM   Specimen: Nasopharyngeal Swab  Result Value Ref Range Status   SARS Coronavirus 2 NEGATIVE NEGATIVE Final    Comment: (NOTE) SARS-CoV-2 target nucleic acids are NOT DETECTED.  The SARS-CoV-2 RNA is generally detectable in upper and lower respiratory specimens during the acute phase of infection. Negative results do not preclude SARS-CoV-2 infection, do not rule out co-infections with other pathogens, and should not be used as the sole basis for treatment or other patient management decisions. Negative results must be combined with clinical observations, patient history, and epidemiological information. The expected result is  Negative.  Fact Sheet for Patients: SugarRoll.be  Fact Sheet for Healthcare Providers: https://www.woods-mathews.com/  This test is not yet approved or cleared by the [place] FDA and  has been authorized for detection and/or diagnosis of SARS-CoV-2 by FDA under an Emergency Use Authorization (EUA). This EUA will remain  in effect (meaning this test can be used) for the duration of the COVID-19 declaration under Se ction 564(b)(1) of the Act, 21 U.S.C. section 360bbb-3(b)(1), unless the authorization is terminated or revoked sooner.  Performed at [facility], [place] [address], [place], [place] [address]   SARS Coronavirus 2 by RT PCR (hospital order, performed in [facility]  hospital lab) Nasopharyngeal Nasopharyngeal Swab     Status: None   Collection Time: 12/26/19  3:28 PM   Specimen: Nasopharyngeal Swab  Result Value Ref Range Status   SARS Coronavirus 2 NEGATIVE NEGATIVE Final    Comment: (NOTE) SARS-CoV-2 target nucleic acids are NOT DETECTED.  The SARS-CoV-2 RNA is generally detectable in upper and lower respiratory specimens during the acute phase of infection. The lowest concentration of SARS-CoV-2 viral copies this assay can detect is 250 copies / mL. A negative result does not preclude SARS-CoV-2 infection and should not be used as the sole basis for treatment or other patient management decisions.  A negative result may occur with improper specimen collection / handling, submission of specimen other than nasopharyngeal swab, presence of viral mutation(s) within the areas targeted by this assay, and inadequate number of viral copies (<250 copies / mL). A negative result must be combined with clinical observations, patient history, and epidemiological information.  Fact Sheet for Patients:   StrictlyIdeas.no  Fact Sheet for Healthcare Providers: BankingDealers.co.za  This test is not yet approved or  cleared by the [place] FDA and has been authorized for detection and/or diagnosis of SARS-CoV-2 by FDA under an Emergency Use Authorization (EUA).  This EUA will remain in effect (meaning this test can be used) for the duration of the COVID-19 declaration under Section 564(b)(1) of the Act, 21 U.S.C. section 360bbb-3(b)(1), unless the authorization is terminated or revoked sooner.  Performed at [facility], [place] [address], [place], [place] [address]      Time coordinating discharge: 45 minutes  SIGNED:   Tawni Millers, MD  [facility] 12/27/2019, 10:30 AM

## 2019-12-27 NOTE — Care Management Important Message (Signed)
Important Message  Patient Details  Name: Matthew Gutierrez MRN: [id number] Date of Birth: 12-21-1955   Medicare Important Message Given:  Yes     Shelda Altes 12/27/2019, 10:57 AM

## 2019-12-28 DIAGNOSIS — I517 Cardiomegaly: Secondary | ICD-10-CM | POA: Diagnosis not present

## 2019-12-28 DIAGNOSIS — J9601 Acute respiratory failure with hypoxia: Secondary | ICD-10-CM | POA: Diagnosis not present

## 2019-12-28 DIAGNOSIS — N39498 Other specified urinary incontinence: Secondary | ICD-10-CM | POA: Diagnosis not present

## 2019-12-28 DIAGNOSIS — D6869 Other thrombophilia: Secondary | ICD-10-CM | POA: Diagnosis not present

## 2019-12-28 DIAGNOSIS — J9621 Acute and chronic respiratory failure with hypoxia: Secondary | ICD-10-CM | POA: Diagnosis not present

## 2019-12-28 DIAGNOSIS — Z5181 Encounter for therapeutic drug level monitoring: Secondary | ICD-10-CM | POA: Diagnosis not present

## 2019-12-28 DIAGNOSIS — I890 Lymphedema, not elsewhere classified: Secondary | ICD-10-CM | POA: Diagnosis not present

## 2019-12-28 DIAGNOSIS — N1831 Chronic kidney disease, stage 3a: Secondary | ICD-10-CM | POA: Diagnosis not present

## 2019-12-28 DIAGNOSIS — I872 Venous insufficiency (chronic) (peripheral): Secondary | ICD-10-CM | POA: Diagnosis not present

## 2019-12-28 DIAGNOSIS — E119 Type 2 diabetes mellitus without complications: Secondary | ICD-10-CM | POA: Diagnosis not present

## 2019-12-28 DIAGNOSIS — M2550 Pain in unspecified joint: Secondary | ICD-10-CM | POA: Diagnosis not present

## 2019-12-28 DIAGNOSIS — E785 Hyperlipidemia, unspecified: Secondary | ICD-10-CM | POA: Diagnosis not present

## 2019-12-28 DIAGNOSIS — Z8349 Family history of other endocrine, nutritional and metabolic diseases: Secondary | ICD-10-CM | POA: Diagnosis not present

## 2019-12-28 DIAGNOSIS — I2690 Septic pulmonary embolism without acute cor pulmonale: Secondary | ICD-10-CM | POA: Diagnosis not present

## 2019-12-28 DIAGNOSIS — Z955 Presence of coronary angioplasty implant and graft: Secondary | ICD-10-CM | POA: Diagnosis not present

## 2019-12-28 DIAGNOSIS — N189 Chronic kidney disease, unspecified: Secondary | ICD-10-CM | POA: Diagnosis not present

## 2019-12-28 DIAGNOSIS — Z8701 Personal history of pneumonia (recurrent): Secondary | ICD-10-CM | POA: Diagnosis not present

## 2019-12-28 DIAGNOSIS — R5381 Other malaise: Secondary | ICD-10-CM | POA: Diagnosis not present

## 2019-12-28 DIAGNOSIS — Z7901 Long term (current) use of anticoagulants: Secondary | ICD-10-CM | POA: Diagnosis not present

## 2019-12-28 DIAGNOSIS — Z7984 Long term (current) use of oral hypoglycemic drugs: Secondary | ICD-10-CM | POA: Diagnosis not present

## 2019-12-28 DIAGNOSIS — R05 Cough: Secondary | ICD-10-CM | POA: Diagnosis not present

## 2019-12-28 DIAGNOSIS — Z8249 Family history of ischemic heart disease and other diseases of the circulatory system: Secondary | ICD-10-CM | POA: Diagnosis not present

## 2019-12-28 DIAGNOSIS — R0600 Dyspnea, unspecified: Secondary | ICD-10-CM | POA: Diagnosis not present

## 2019-12-28 DIAGNOSIS — E1122 Type 2 diabetes mellitus with diabetic chronic kidney disease: Secondary | ICD-10-CM | POA: Diagnosis not present

## 2019-12-28 DIAGNOSIS — I5033 Acute on chronic diastolic (congestive) heart failure: Secondary | ICD-10-CM | POA: Diagnosis not present

## 2019-12-28 DIAGNOSIS — E1142 Type 2 diabetes mellitus with diabetic polyneuropathy: Secondary | ICD-10-CM | POA: Diagnosis not present

## 2019-12-28 DIAGNOSIS — J449 Chronic obstructive pulmonary disease, unspecified: Secondary | ICD-10-CM | POA: Diagnosis not present

## 2019-12-28 DIAGNOSIS — G40A09 Absence epileptic syndrome, not intractable, without status epilepticus: Secondary | ICD-10-CM | POA: Diagnosis not present

## 2019-12-28 DIAGNOSIS — J90 Pleural effusion, not elsewhere classified: Secondary | ICD-10-CM | POA: Diagnosis not present

## 2019-12-28 DIAGNOSIS — G40909 Epilepsy, unspecified, not intractable, without status epilepticus: Secondary | ICD-10-CM | POA: Diagnosis not present

## 2019-12-28 DIAGNOSIS — E1151 Type 2 diabetes mellitus with diabetic peripheral angiopathy without gangrene: Secondary | ICD-10-CM | POA: Diagnosis not present

## 2019-12-28 DIAGNOSIS — R0602 Shortness of breath: Secondary | ICD-10-CM | POA: Diagnosis not present

## 2019-12-28 DIAGNOSIS — N1832 Chronic kidney disease, stage 3b: Secondary | ICD-10-CM | POA: Diagnosis not present

## 2019-12-28 DIAGNOSIS — Z86718 Personal history of other venous thrombosis and embolism: Secondary | ICD-10-CM | POA: Diagnosis not present

## 2019-12-28 DIAGNOSIS — Z7401 Bed confinement status: Secondary | ICD-10-CM | POA: Diagnosis not present

## 2019-12-28 DIAGNOSIS — I4891 Unspecified atrial fibrillation: Secondary | ICD-10-CM | POA: Diagnosis not present

## 2019-12-28 DIAGNOSIS — R609 Edema, unspecified: Secondary | ICD-10-CM | POA: Diagnosis not present

## 2019-12-28 DIAGNOSIS — Z885 Allergy status to narcotic agent status: Secondary | ICD-10-CM | POA: Diagnosis not present

## 2019-12-28 DIAGNOSIS — Z6841 Body Mass Index (BMI) 40.0 and over, adult: Secondary | ICD-10-CM | POA: Diagnosis not present

## 2019-12-28 DIAGNOSIS — R0989 Other specified symptoms and signs involving the circulatory and respiratory systems: Secondary | ICD-10-CM | POA: Diagnosis not present

## 2019-12-28 DIAGNOSIS — G4733 Obstructive sleep apnea (adult) (pediatric): Secondary | ICD-10-CM | POA: Diagnosis not present

## 2019-12-28 DIAGNOSIS — J189 Pneumonia, unspecified organism: Secondary | ICD-10-CM | POA: Diagnosis not present

## 2019-12-28 DIAGNOSIS — G40319 Generalized idiopathic epilepsy and epileptic syndromes, intractable, without status epilepticus: Secondary | ICD-10-CM | POA: Diagnosis not present

## 2019-12-28 DIAGNOSIS — Z9119 Patient's noncompliance with other medical treatment and regimen: Secondary | ICD-10-CM | POA: Diagnosis not present

## 2019-12-28 DIAGNOSIS — Z87891 Personal history of nicotine dependence: Secondary | ICD-10-CM | POA: Diagnosis not present

## 2019-12-28 DIAGNOSIS — R7989 Other specified abnormal findings of blood chemistry: Secondary | ICD-10-CM | POA: Diagnosis not present

## 2019-12-28 DIAGNOSIS — I10 Essential (primary) hypertension: Secondary | ICD-10-CM | POA: Diagnosis not present

## 2019-12-28 DIAGNOSIS — I2510 Atherosclerotic heart disease of native coronary artery without angina pectoris: Secondary | ICD-10-CM | POA: Diagnosis not present

## 2019-12-28 DIAGNOSIS — I480 Paroxysmal atrial fibrillation: Secondary | ICD-10-CM | POA: Diagnosis not present

## 2019-12-28 DIAGNOSIS — E1169 Type 2 diabetes mellitus with other specified complication: Secondary | ICD-10-CM | POA: Diagnosis not present

## 2019-12-28 DIAGNOSIS — N183 Chronic kidney disease, stage 3 unspecified: Secondary | ICD-10-CM | POA: Diagnosis not present

## 2019-12-28 DIAGNOSIS — I129 Hypertensive chronic kidney disease with stage 1 through stage 4 chronic kidney disease, or unspecified chronic kidney disease: Secondary | ICD-10-CM | POA: Diagnosis not present

## 2019-12-28 DIAGNOSIS — I1310 Hypertensive heart and chronic kidney disease without heart failure, with stage 1 through stage 4 chronic kidney disease, or unspecified chronic kidney disease: Secondary | ICD-10-CM | POA: Diagnosis not present

## 2019-12-28 DIAGNOSIS — Z79899 Other long term (current) drug therapy: Secondary | ICD-10-CM | POA: Diagnosis not present

## 2019-12-28 DIAGNOSIS — J9811 Atelectasis: Secondary | ICD-10-CM | POA: Diagnosis not present

## 2019-12-28 DIAGNOSIS — E669 Obesity, unspecified: Secondary | ICD-10-CM | POA: Diagnosis not present

## 2019-12-28 DIAGNOSIS — Z8673 Personal history of transient ischemic attack (TIA), and cerebral infarction without residual deficits: Secondary | ICD-10-CM | POA: Diagnosis not present

## 2019-12-30 ENCOUNTER — Other Ambulatory Visit: Payer: Self-pay

## 2019-12-30 ENCOUNTER — Ambulatory Visit (HOSPITAL_COMMUNITY): Payer: Medicare HMO | Admitting: Physician Assistant

## 2019-12-30 MED ORDER — WARFARIN SODIUM 1 MG PO TABS: 1.0000 mg | ORAL_TABLET | Freq: Once | ORAL | 5 refills | Status: DC

## 2019-12-31 DIAGNOSIS — R5381 Other malaise: Secondary | ICD-10-CM | POA: Diagnosis not present

## 2019-12-31 DIAGNOSIS — E1169 Type 2 diabetes mellitus with other specified complication: Secondary | ICD-10-CM | POA: Diagnosis not present

## 2019-12-31 DIAGNOSIS — J9621 Acute and chronic respiratory failure with hypoxia: Secondary | ICD-10-CM | POA: Diagnosis not present

## 2019-12-31 DIAGNOSIS — J449 Chronic obstructive pulmonary disease, unspecified: Secondary | ICD-10-CM | POA: Diagnosis not present

## 2019-12-31 DIAGNOSIS — N1831 Chronic kidney disease, stage 3a: Secondary | ICD-10-CM | POA: Diagnosis not present

## 2019-12-31 DIAGNOSIS — I4891 Unspecified atrial fibrillation: Secondary | ICD-10-CM | POA: Diagnosis not present

## 2019-12-31 DIAGNOSIS — I890 Lymphedema, not elsewhere classified: Secondary | ICD-10-CM | POA: Diagnosis not present

## 2019-12-31 DIAGNOSIS — I10 Essential (primary) hypertension: Secondary | ICD-10-CM | POA: Diagnosis not present

## 2020-01-01 ENCOUNTER — Other Ambulatory Visit: Payer: Self-pay

## 2020-01-01 MED ORDER — WARFARIN SODIUM 4 MG PO TABS: 4.0000 mg | ORAL_TABLET | Freq: Every day | ORAL | 5 refills | Status: DC

## 2020-01-07 ENCOUNTER — Encounter (HOSPITAL_COMMUNITY): Payer: Self-pay | Admitting: Physician Assistant

## 2020-01-07 ENCOUNTER — Other Ambulatory Visit: Payer: Self-pay

## 2020-01-07 ENCOUNTER — Ambulatory Visit (HOSPITAL_COMMUNITY)
Admission: RE | Admit: 2020-01-07 | Discharge: 2020-01-07 | Disposition: A | Discharge status: Home / Self Care | Payer: Medicare HMO | Source: Ambulatory Visit | Attending: Physician Assistant | Admitting: Physician Assistant

## 2020-01-07 VITALS — BP 150/80 | HR 87 | Ht 72.0 in | Wt 296.2 lb

## 2020-01-07 DIAGNOSIS — Z7901 Long term (current) use of anticoagulants: Secondary | ICD-10-CM | POA: Diagnosis not present

## 2020-01-07 DIAGNOSIS — R0602 Shortness of breath: Secondary | ICD-10-CM | POA: Diagnosis not present

## 2020-01-07 DIAGNOSIS — Z6841 Body Mass Index (BMI) 40.0 and over, adult: Secondary | ICD-10-CM | POA: Diagnosis not present

## 2020-01-07 DIAGNOSIS — E669 Obesity, unspecified: Secondary | ICD-10-CM | POA: Diagnosis not present

## 2020-01-07 DIAGNOSIS — I480 Paroxysmal atrial fibrillation: Secondary | ICD-10-CM | POA: Insufficient documentation

## 2020-01-07 DIAGNOSIS — R7989 Other specified abnormal findings of blood chemistry: Secondary | ICD-10-CM | POA: Diagnosis not present

## 2020-01-07 DIAGNOSIS — Z885 Allergy status to narcotic agent status: Secondary | ICD-10-CM | POA: Diagnosis not present

## 2020-01-07 DIAGNOSIS — Z5181 Encounter for therapeutic drug level monitoring: Secondary | ICD-10-CM | POA: Diagnosis not present

## 2020-01-07 DIAGNOSIS — I129 Hypertensive chronic kidney disease with stage 1 through stage 4 chronic kidney disease, or unspecified chronic kidney disease: Secondary | ICD-10-CM | POA: Insufficient documentation

## 2020-01-07 DIAGNOSIS — N189 Chronic kidney disease, unspecified: Secondary | ICD-10-CM | POA: Diagnosis not present

## 2020-01-07 DIAGNOSIS — Z8249 Family history of ischemic heart disease and other diseases of the circulatory system: Secondary | ICD-10-CM | POA: Diagnosis not present

## 2020-01-07 DIAGNOSIS — J189 Pneumonia, unspecified organism: Secondary | ICD-10-CM | POA: Diagnosis not present

## 2020-01-07 DIAGNOSIS — Z86718 Personal history of other venous thrombosis and embolism: Secondary | ICD-10-CM | POA: Insufficient documentation

## 2020-01-07 DIAGNOSIS — Z7984 Long term (current) use of oral hypoglycemic drugs: Secondary | ICD-10-CM | POA: Diagnosis not present

## 2020-01-07 DIAGNOSIS — Z8349 Family history of other endocrine, nutritional and metabolic diseases: Secondary | ICD-10-CM | POA: Insufficient documentation

## 2020-01-07 DIAGNOSIS — G40909 Epilepsy, unspecified, not intractable, without status epilepticus: Secondary | ICD-10-CM | POA: Insufficient documentation

## 2020-01-07 DIAGNOSIS — Z9119 Patient's noncompliance with other medical treatment and regimen: Secondary | ICD-10-CM | POA: Insufficient documentation

## 2020-01-07 DIAGNOSIS — Z87891 Personal history of nicotine dependence: Secondary | ICD-10-CM | POA: Insufficient documentation

## 2020-01-07 DIAGNOSIS — Z8673 Personal history of transient ischemic attack (TIA), and cerebral infarction without residual deficits: Secondary | ICD-10-CM | POA: Diagnosis not present

## 2020-01-07 DIAGNOSIS — Z79899 Other long term (current) drug therapy: Secondary | ICD-10-CM | POA: Diagnosis not present

## 2020-01-07 DIAGNOSIS — G4733 Obstructive sleep apnea (adult) (pediatric): Secondary | ICD-10-CM | POA: Insufficient documentation

## 2020-01-07 DIAGNOSIS — J9621 Acute and chronic respiratory failure with hypoxia: Secondary | ICD-10-CM | POA: Diagnosis not present

## 2020-01-07 DIAGNOSIS — E785 Hyperlipidemia, unspecified: Secondary | ICD-10-CM | POA: Diagnosis not present

## 2020-01-07 DIAGNOSIS — E1151 Type 2 diabetes mellitus with diabetic peripheral angiopathy without gangrene: Secondary | ICD-10-CM | POA: Insufficient documentation

## 2020-01-07 DIAGNOSIS — E1122 Type 2 diabetes mellitus with diabetic chronic kidney disease: Secondary | ICD-10-CM | POA: Insufficient documentation

## 2020-01-07 DIAGNOSIS — D6869 Other thrombophilia: Secondary | ICD-10-CM | POA: Insufficient documentation

## 2020-01-07 DIAGNOSIS — Z955 Presence of coronary angioplasty implant and graft: Secondary | ICD-10-CM | POA: Insufficient documentation

## 2020-01-07 DIAGNOSIS — J449 Chronic obstructive pulmonary disease, unspecified: Secondary | ICD-10-CM | POA: Insufficient documentation

## 2020-01-07 DIAGNOSIS — Z8701 Personal history of pneumonia (recurrent): Secondary | ICD-10-CM | POA: Diagnosis not present

## 2020-01-07 DIAGNOSIS — I4891 Unspecified atrial fibrillation: Secondary | ICD-10-CM | POA: Diagnosis not present

## 2020-01-07 DIAGNOSIS — E1169 Type 2 diabetes mellitus with other specified complication: Secondary | ICD-10-CM | POA: Diagnosis not present

## 2020-01-07 HISTORY — DX: Paroxysmal atrial fibrillation: I48.0

## 2020-01-07 HISTORY — DX: Other thrombophilia: D68.69

## 2020-01-07 NOTE — Progress Notes (Signed)
Primary Care Physician: Matthew Gutierrez, Matthew Gutierrez Primary Cardiologist: Dr Matthew Gutierrez Primary Electrophysiologist: none Referring Physician: Dr Matthew Gutierrez is a 64 y.o. male with a history of HTN, HLD, OSA, DM, COPD, CKD, seizure d/x with CVA, DVT on chronic anticoagulation with warfarin, and paroxysmal atrial fibrillation who presents for consultation in the [facility]. The patient was admitted 12/14/19 with cellulitis and developed afib with RVR. He was in and out of afib during admission and was loaded on amiodarone. He also required significant diuresis. Patient is on warfarin for a CHADS2VASC score of 6. He reports that he does feel improved since discharge although he is currently being treated for PNA. He is tolerating the amiodarone without difficulty. He denies bleeding issues with anticoagulation.   Today, he denies symptoms of palpitations, chest pain, orthopnea, PND, lower extremity edema, dizziness, presyncope, syncope, bleeding, or neurologic sequela. The patient is tolerating medications without difficulties and is otherwise without complaint today.    Atrial Fibrillation Risk Factors:  he does have symptoms or diagnosis of sleep apnea. he is not compliant with CPAP therapy. he does not have a history of rheumatic fever.   he has a BMI of Body mass index is 40.17 kg/m.Marland Kitchen Filed Weights   01/07/20 1041  Weight: 134.4 kg    Family History  Problem Relation Age of Onset  . Cancer Mother   . Stroke Father   . Hypertension Other   . Stroke Other   . Hyperlipidemia Other   . Alcohol abuse Other   . Heart disease Other   . Cancer Other      Atrial Fibrillation Management history:  Previous antiarrhythmic drugs: amiodarone Previous cardioversions: none Previous ablations: none CHADS2VASC score: 6 Anticoagulation history: warfarin   Past Medical History:  Diagnosis Date  . Epilepsy, grand mal ([place])   . Frozen  shoulder    Right Shoulder  . Lumbago   . Peripheral vascular disease ([place])   . Stroke [facility])    Past Surgical History:  Procedure Laterality Date  . CORONARY STENT PLACEMENT      Current Outpatient Medications  Medication Sig Dispense Refill  . allopurinol (ZYLOPRIM) 300 MG tablet Take 1 tablet (300 mg total) by mouth daily. 90 tablet 1  . amiodarone (PACERONE) 200 MG tablet Take 1 tablet (200 mg total) by mouth daily. 30 tablet 0  . amLODipine (NORVASC) 10 MG tablet Take 1 tablet (10 mg total) by mouth daily. 30 tablet 0  . fesoterodine (TOVIAZ) 4 MG TB24 tablet Take 4 mg by mouth daily.    . fluticasone (FLONASE) 50 MCG/ACT nasal spray Place 1 spray into both nostrils daily. 18.2 mL 2  . gabapentin (NEURONTIN) 300 MG capsule Take 1 capsule (300 mg total) by mouth at bedtime. 90 capsule 2  . hydrALAZINE (APRESOLINE) 100 MG tablet Take 1 tablet (100 mg total) by mouth every 8 (eight) hours. 90 tablet 0  . isosorbide mononitrate (IMDUR) 120 MG 24 hr tablet Take 1 tablet (120 mg total) by mouth daily. 30 tablet 0  . metFORMIN (GLUCOPHAGE) 1000 MG tablet Take 1 tablet (1,000 mg total) by mouth 2 (two) times daily with a meal. 180 tablet 2  . phenytoin (DILANTIN) 100 MG ER capsule Take 1 capsule (100 mg total) by mouth 2 (two) times daily. 60 capsule 0  . pravastatin (PRAVACHOL) 40 MG tablet Take 1 tablet (40 mg total) by mouth daily. 90 tablet 2  . torsemide (DEMADEX) 20  MG tablet Take 3 tablets (60 mg total) by mouth daily. 90 tablet 0  . triamcinolone cream (KENALOG) 0.1 % APPLY TO THE AFFECTED AREA(S) TWICE DAILY AS NEEDED 454 g 2  . warfarin (COUMADIN) 4 MG tablet Take 1 tablet (4 mg total) by mouth daily. 90 tablet 5   No current facility-administered medications for this encounter.    Allergies  Allergen Reactions  . Codeine     Social History   Socioeconomic History  . Marital status: Divorced    Spouse name: Not on file  . Number of children: 2  . Years of education:  Not on file  . Highest education level: Not on file  Occupational History  . Occupation: Disability  Tobacco Use  . Smoking status: Former Smoker    Quit date: 10/28/2009    Years since quitting: 10.2  . Smokeless tobacco: Never Used  Substance and Sexual Activity  . Alcohol use: Not Currently    Comment: occassionally  . Drug use: No  . Sexual activity: Not Currently  Other Topics Concern  . Not on file  Social History Narrative  . Not on file   Social Determinants of Health   Financial Resource Strain:   . Difficulty of Paying Living Expenses:   Food Insecurity:   . Worried About Charity fundraiser in the Last Year:   . Arboriculturist in the Last Year:   Transportation Needs:   . Film/video editor (Medical):   Marland Kitchen Lack of Transportation (Non-Medical):   Physical Activity: Inactive  . Days of Exercise per Week: 0 days  . Minutes of Exercise per Session: 0 min  Stress:   . Feeling of Stress :   Social Connections:   . Frequency of Communication with Friends and Family:   . Frequency of Social Gatherings with Friends and Family:   . Attends Religious Services:   . Active Member of Clubs or Organizations:   . Attends Archivist Meetings:   Marland Kitchen Marital Status:   Intimate Partner Violence: Not At Risk  . Fear of Current or Ex-Partner: No  . Emotionally Abused: No  . Physically Abused: No  . Sexually Abused: No     ROS- All systems are reviewed and negative except as per the HPI above.  Physical Exam: Vitals:   01/07/20 1041  BP: (!) 150/80  Pulse: 87  Weight: 134.4 kg  Height: 6' (1.829 m)    GEN- The patient is well appearing obese male, alert and oriented x 3 today.   Head- normocephalic, atraumatic Eyes-  Sclera clear, conjunctiva pink Ears- hearing intact Oropharynx- clear Neck- supple  Lungs- Clear to ausculation bilaterally, diminished breath sounds, normal work of breathing Heart- Regular rate and rhythm, no murmurs, rubs or gallops    GI- soft, NT, ND, + BS Extremities- no clubbing, cyanosis. 1+ bilateral edema MS- no significant deformity or atrophy Skin- no rash or lesion Psych- euthymic mood, full affect Neuro- strength and sensation are intact  Wt Readings from Last 3 Encounters:  01/07/20 134.4 kg  12/27/19 (!) 134.3 kg  11/15/19 (!) 146.1 kg    EKG today demonstrates SR HR 87, PR 124, QRS 94, QTc 440  Echo 12/15/19 demonstrated  1. Poor acoustic windows limit study Left ventricular ejection fraction,  by estimation, is 60 to 65%. The left ventricle has normal function. Left  ventricular endocardial border not optimally defined to evaluate regional  wall motion. The left  ventricular internal cavity size  was mildly dilated. Left ventricular  diastolic function could not be evaluated.  2. Right ventricular systolic function is grossly normal. The right  ventricular size is normal.  3. Pericardial effusion is mainly posterior to LV 14 mm maximal  dimenstion. No evid of filling compromise. . Moderate pericardial  effusion.  4. The mitral valve is normal in structure. No evidence of mitral valve  regurgitation.  5. The aortic valve is tricuspid. Aortic valve regurgitation is not  visualized. Mild aortic valve sclerosis is present, with no evidence of  aortic valve stenosis.  6. The inferior vena cava is dilated in size with >50% respiratory  variability, suggesting right atrial pressure of 8 mmHg.   Epic records are reviewed at length today  CHA2DS2-VASc Score = 6  The patient's score is based upon: CHF History: 1 HTN History: 1 Age : 1 Diabetes History: 1 Stroke History: 2 Vascular Disease History: 0 Gender: 0      ASSESSMENT AND PLAN: 1. Paroxysmal Atrial Fibrillation (ICD10:  I48.0) The patient's CHA2DS2-VASc score is 6, indicating a 9.7% annual risk of stroke.   ? Related to acute infection. Patient appears to be maintaining SR. Continue amiodarone 200 mg daily for now. May be able  to decrease / discontinue if he continues to maintain SR. Will defer to primary cardiologist.  Continue warfarin  2. Secondary Hypercoagulable State (ICD10:  D68.69) The patient is at significant risk for stroke/thromboembolism based upon his CHA2DS2-VASc Score of 6.  Continue Warfarin (Coumadin).   3. Obesity Body mass index is 40.17 kg/m. Lifestyle modification was discussed at length including regular exercise and weight reduction.  4. Obstructive sleep apnea The importance of adequate treatment of sleep apnea was discussed today in order to improve our ability to maintain sinus rhythm long term. Patient did not tolerate mask previously.   5. HTN Stable, no changes today.   Follow up with Dr Matthew Gutierrez in one month. Patient prefers to follow up closer to his home. AF clinic as needed.    [facility] [address] [place], [place] [address] [phone] 01/07/2020 5:53 PM

## 2020-01-07 NOTE — Patient Instructions (Signed)
[facility] office will contact you for follow up appointment to see Dr. Agustin Cree in one month

## 2020-01-08 DIAGNOSIS — R05 Cough: Secondary | ICD-10-CM | POA: Diagnosis not present

## 2020-01-08 DIAGNOSIS — N183 Chronic kidney disease, stage 3 unspecified: Secondary | ICD-10-CM | POA: Diagnosis not present

## 2020-01-08 DIAGNOSIS — R0602 Shortness of breath: Secondary | ICD-10-CM | POA: Diagnosis not present

## 2020-01-08 DIAGNOSIS — J9811 Atelectasis: Secondary | ICD-10-CM | POA: Diagnosis not present

## 2020-01-08 DIAGNOSIS — E119 Type 2 diabetes mellitus without complications: Secondary | ICD-10-CM | POA: Diagnosis not present

## 2020-01-08 DIAGNOSIS — J449 Chronic obstructive pulmonary disease, unspecified: Secondary | ICD-10-CM | POA: Diagnosis not present

## 2020-01-08 DIAGNOSIS — J90 Pleural effusion, not elsewhere classified: Secondary | ICD-10-CM | POA: Diagnosis not present

## 2020-01-08 DIAGNOSIS — G40909 Epilepsy, unspecified, not intractable, without status epilepticus: Secondary | ICD-10-CM | POA: Diagnosis not present

## 2020-01-08 DIAGNOSIS — I517 Cardiomegaly: Secondary | ICD-10-CM | POA: Diagnosis not present

## 2020-01-08 DIAGNOSIS — R0600 Dyspnea, unspecified: Secondary | ICD-10-CM | POA: Diagnosis not present

## 2020-01-08 DIAGNOSIS — I129 Hypertensive chronic kidney disease with stage 1 through stage 4 chronic kidney disease, or unspecified chronic kidney disease: Secondary | ICD-10-CM | POA: Diagnosis not present

## 2020-01-08 DIAGNOSIS — Z8673 Personal history of transient ischemic attack (TIA), and cerebral infarction without residual deficits: Secondary | ICD-10-CM | POA: Diagnosis not present

## 2020-01-14 ENCOUNTER — Telehealth: Payer: Self-pay

## 2020-01-14 DIAGNOSIS — N1831 Chronic kidney disease, stage 3a: Secondary | ICD-10-CM | POA: Diagnosis not present

## 2020-01-14 DIAGNOSIS — E1169 Type 2 diabetes mellitus with other specified complication: Secondary | ICD-10-CM | POA: Diagnosis not present

## 2020-01-14 DIAGNOSIS — I890 Lymphedema, not elsewhere classified: Secondary | ICD-10-CM | POA: Diagnosis not present

## 2020-01-14 DIAGNOSIS — J9621 Acute and chronic respiratory failure with hypoxia: Secondary | ICD-10-CM | POA: Diagnosis not present

## 2020-01-14 DIAGNOSIS — J449 Chronic obstructive pulmonary disease, unspecified: Secondary | ICD-10-CM | POA: Diagnosis not present

## 2020-01-14 DIAGNOSIS — I10 Essential (primary) hypertension: Secondary | ICD-10-CM | POA: Diagnosis not present

## 2020-01-14 DIAGNOSIS — I4891 Unspecified atrial fibrillation: Secondary | ICD-10-CM | POA: Diagnosis not present

## 2020-01-14 DIAGNOSIS — R5381 Other malaise: Secondary | ICD-10-CM | POA: Diagnosis not present

## 2020-01-14 NOTE — Progress Notes (Addendum)
Chronic Care Management Pharmacy Assistant   Name: Matthew Gutierrez  MRN: [id number] DOB: 04-May-1956  Reason for Encounter: Medication Review  Patient Questions:  1.  Have you seen any other providers since your last visit? Yes  2.  Any changes in your medicines or health? No   12/14/19 Admitted to hospital, Acute Kidney Injury, cellulitis.  01/07/20- Cardiology- seen for Afib- Per Clint Fenton, PA's note- he developed Afib with RVR. He was in an out of Afib during admission and given Amiodarone.  PCP : Lillard Anes, MD  Allergies:   Allergies  Allergen Reactions  . Codeine     Medications: Outpatient Encounter Medications as of 01/14/2020  Medication Sig  . allopurinol (ZYLOPRIM) 300 MG tablet Take 1 tablet (300 mg total) by mouth daily.  Marland Kitchen amiodarone (PACERONE) 200 MG tablet Take 1 tablet (200 mg total) by mouth daily.  Marland Kitchen amLODipine (NORVASC) 10 MG tablet Take 1 tablet (10 mg total) by mouth daily.  . fesoterodine (TOVIAZ) 4 MG TB24 tablet Take 4 mg by mouth daily.  . fluticasone (FLONASE) 50 MCG/ACT nasal spray Place 1 spray into both nostrils daily.  Marland Kitchen gabapentin (NEURONTIN) 300 MG capsule Take 1 capsule (300 mg total) by mouth at bedtime.  . hydrALAZINE (APRESOLINE) 100 MG tablet Take 1 tablet (100 mg total) by mouth every 8 (eight) hours.  . isosorbide mononitrate (IMDUR) 120 MG 24 hr tablet Take 1 tablet (120 mg total) by mouth daily.  . metFORMIN (GLUCOPHAGE) 1000 MG tablet Take 1 tablet (1,000 mg total) by mouth 2 (two) times daily with a meal.  . phenytoin (DILANTIN) 100 MG ER capsule Take 1 capsule (100 mg total) by mouth 2 (two) times daily.  . pravastatin (PRAVACHOL) 40 MG tablet Take 1 tablet (40 mg total) by mouth daily.  Marland Kitchen torsemide (DEMADEX) 20 MG tablet Take 3 tablets (60 mg total) by mouth daily.  Marland Kitchen triamcinolone cream (KENALOG) 0.1 % APPLY TO THE AFFECTED AREA(S) TWICE DAILY AS NEEDED  . warfarin (COUMADIN) 4 MG tablet Take 1 tablet (4 mg total) by  mouth daily.   No facility-administered encounter medications on file as of 01/14/2020.    Current Diagnosis: Patient Active Problem List   Diagnosis Date Noted  . Paroxysmal atrial fibrillation ([place]) 01/07/2020  . Secondary hypercoagulable state ([place]) 01/07/2020  . Cellulitis of leg 12/14/2019  . Atrial fibrillation with RVR ([place]) 12/14/2019  . Diabetic glomerulopathy ([place]) 10/15/2019  . 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[place]) 10/15/2019  . Gout attack 10/06/2019  . Urinary incontinence 07/25/2019  . Shoulder bursitis 07/25/2019  . Acute renal failure superimposed on stage 3 chronic kidney disease ([place]) 07/23/2019  . Mixed hyperlipidemia 07/23/2019  . Absence epileptic syndrome, not intractable, without status epilepticus ([place]) 07/23/2019  . Atherosclerotic heart disease 07/23/2019  . Diabetic neuropathy ([place]) 07/23/2019  . Benign enlargement of prostate 07/23/2019  . COPD (chronic obstructive pulmonary disease) ([place]) 07/23/2019  . Long term (current) use of anticoagulants 07/23/2019  . Morbid obesity ([place]) 07/23/2019  . Chronic venous hypertension (idiopathic) with ulcer of left lower extremity ([place]) 07/23/2019  . Obstructive sleep apnea 07/23/2019  . Incontinence without sensory awareness 07/23/2019  . Chronic respiratory failure with hypoxia ([place]) 07/23/2019  . Body mass index 50.0-59.9, adult ([place]) 07/23/2019  . H/O acute myocardial infarction 01/22/2018  . Essential hypertension 01/22/2018  . H/O: stroke 01/22/2018  . History of DVT (deep vein thrombosis) 01/22/2018  . Lymphedema of both lower extremities 01/22/2018  . Peripheral vascular disease, unspecified ([place])  07/12/2011    Goals Addressed   None     Follow-Up:  Coordination of Enhanced Pharmacy Services   Reached out to patient today for medication adherence call. Patient was admitted to hospital on 12/14/19- went to Rehab from there. Currently still in there with hopes to go home today or tomorrow. He states he is walking  again. He does have some concerns with swelling in his legs. He will call back after he is discharged from Rehab facility to update medication list. Message sent to [facility] about swelling concerns to help coordinate best next steps and/or advice for patient. He states he is doing much better and is just ready to be back at home.   Martinique Uselman, [place] Pharmacist Assistant  [phone]  Pharmacist will schedule a follow-up visit with patient to reconcile medication and address goals/needs once discharged.   Sherre Poot, PharmD, [facility] Clinical Pharmacist [facility] [phone] (office) [phone] (mobile)

## 2020-01-15 ENCOUNTER — Ambulatory Visit: Payer: Medicare HMO | Admitting: Legal Medicine

## 2020-01-15 ENCOUNTER — Other Ambulatory Visit: Payer: Self-pay

## 2020-01-15 ENCOUNTER — Ambulatory Visit: Payer: Self-pay

## 2020-01-15 DIAGNOSIS — I739 Peripheral vascular disease, unspecified: Secondary | ICD-10-CM | POA: Insufficient documentation

## 2020-01-15 DIAGNOSIS — M7500 Adhesive capsulitis of unspecified shoulder: Secondary | ICD-10-CM | POA: Insufficient documentation

## 2020-01-15 DIAGNOSIS — I639 Cerebral infarction, unspecified: Secondary | ICD-10-CM | POA: Insufficient documentation

## 2020-01-15 DIAGNOSIS — G40309 Generalized idiopathic epilepsy and epileptic syndromes, not intractable, without status epilepticus: Secondary | ICD-10-CM | POA: Insufficient documentation

## 2020-01-15 DIAGNOSIS — G40409 Other generalized epilepsy and epileptic syndromes, not intractable, without status epilepticus: Secondary | ICD-10-CM | POA: Insufficient documentation

## 2020-01-15 DIAGNOSIS — M545 Low back pain, unspecified: Secondary | ICD-10-CM | POA: Insufficient documentation

## 2020-01-15 NOTE — Progress Notes (Signed)
Patient was discharged from rehab today. Patient called to schedule transition of care appointment for tomorrow. Patient informed pharmacist that he didn't have his new medications or know what he was supposed to be taking. Pharmacist and nurse both consulted discharging facility to determine patient's active medications. Patient had combined all of his previous medications into unlabeled bottles for administration and could not identify any of the tablets. Pharmacist worked to help patient discern medication list from facility but he couldn't read it. Pharmacist coordinated with facility to determine if patient's discharge medications were available for pick up. Patient informed that discharge medications at [facility]. Marland KitchenPatient encouraged to bring discharge paperwork and medications to visit with Dr. Henrene Pastor tomorrow.   Sherre Poot, PharmD, [facility] Clinical Pharmacist [facility] [phone] (office) [phone] (mobile)

## 2020-01-16 ENCOUNTER — Other Ambulatory Visit: Payer: Self-pay

## 2020-01-16 ENCOUNTER — Encounter: Payer: Self-pay | Admitting: Legal Medicine

## 2020-01-16 ENCOUNTER — Telehealth: Payer: Medicare HMO

## 2020-01-16 ENCOUNTER — Ambulatory Visit (INDEPENDENT_AMBULATORY_CARE_PROVIDER_SITE_OTHER): Payer: Medicare HMO | Admitting: Legal Medicine

## 2020-01-16 VITALS — BP 102/64 | HR 66 | Temp 98.1°F | Ht 72.0 in | Wt 296.0 lb

## 2020-01-16 DIAGNOSIS — E1149 Type 2 diabetes mellitus with other diabetic neurological complication: Secondary | ICD-10-CM

## 2020-01-16 DIAGNOSIS — I5032 Chronic diastolic (congestive) heart failure: Secondary | ICD-10-CM | POA: Diagnosis not present

## 2020-01-16 DIAGNOSIS — R0602 Shortness of breath: Secondary | ICD-10-CM | POA: Diagnosis not present

## 2020-01-16 DIAGNOSIS — I4891 Unspecified atrial fibrillation: Secondary | ICD-10-CM | POA: Diagnosis not present

## 2020-01-16 DIAGNOSIS — I10 Essential (primary) hypertension: Secondary | ICD-10-CM

## 2020-01-16 DIAGNOSIS — J9611 Chronic respiratory failure with hypoxia: Secondary | ICD-10-CM

## 2020-01-16 DIAGNOSIS — I25118 Atherosclerotic heart disease of native coronary artery with other forms of angina pectoris: Secondary | ICD-10-CM | POA: Diagnosis not present

## 2020-01-16 DIAGNOSIS — G40409 Other generalized epilepsy and epileptic syndromes, not intractable, without status epilepticus: Secondary | ICD-10-CM

## 2020-01-16 DIAGNOSIS — D6869 Other thrombophilia: Secondary | ICD-10-CM

## 2020-01-16 DIAGNOSIS — E782 Mixed hyperlipidemia: Secondary | ICD-10-CM

## 2020-01-16 DIAGNOSIS — E1121 Type 2 diabetes mellitus with diabetic nephropathy: Secondary | ICD-10-CM | POA: Diagnosis not present

## 2020-01-16 DIAGNOSIS — N1831 Chronic kidney disease, stage 3a: Secondary | ICD-10-CM

## 2020-01-16 DIAGNOSIS — Z7901 Long term (current) use of anticoagulants: Secondary | ICD-10-CM

## 2020-01-16 DIAGNOSIS — G40309 Generalized idiopathic epilepsy and epileptic syndromes, not intractable, without status epilepticus: Secondary | ICD-10-CM | POA: Diagnosis not present

## 2020-01-16 DIAGNOSIS — N179 Acute kidney failure, unspecified: Secondary | ICD-10-CM

## 2020-01-16 MED ORDER — PHENYTOIN SODIUM EXTENDED 300 MG PO CAPS: 300.0000 mg | ORAL_CAPSULE | Freq: Two times a day (BID) | ORAL | 2 refills | Status: DC

## 2020-01-16 MED ORDER — TORSEMIDE 20 MG PO TABS: 60.0000 mg | ORAL_TABLET | Freq: Two times a day (BID) | ORAL | 2 refills | Status: DC

## 2020-01-16 MED ORDER — HYDRALAZINE HCL 100 MG PO TABS: 100.0000 mg | ORAL_TABLET | Freq: Three times a day (TID) | ORAL | 2 refills | Status: DC

## 2020-01-16 MED ORDER — ISOSORBIDE MONONITRATE ER 120 MG PO TB24: 120.0000 mg | ORAL_TABLET | Freq: Every day | ORAL | 2 refills | Status: DC

## 2020-01-16 MED ORDER — AMIODARONE HCL 100 MG PO TABS: 200.0000 mg | ORAL_TABLET | Freq: Every day | ORAL | 2 refills | Status: DC

## 2020-01-16 NOTE — Patient Outreach (Addendum)
[facility]) Care Management  01/16/2020  Matthew Gutierrez Dec 29, 1955 [id number]     Transition of Care Referral  Referral Date: 01/16/2020  Referral Source: [facility] Discharge Report Date of Discharge: 01/15/2020 Facility: [facility]    Referral received. Transition of care calls being completed via EMMI-automated calls. RN CM will outreach patient for any red flags received.     Plan: RN CM will close case at this time.    Enzo Montgomery, RN,BSN,CCM [place] Management Telephonic Care Management Coordinator Direct Phone: [phone] Toll Free: [phone] Fax: [phone]

## 2020-01-16 NOTE — Progress Notes (Signed)
Subjective:  Patient ID: Matthew Gutierrez, male    DOB: [birth year]  Age: 64 y.o. MRN: [id number]  Chief Complaint  Patient presents with  . Kidney injury  . Shortness of Breath  Extensive review of hospital records and correcting all the medical mistakes on documentation.  Patient has little insight.  Our Pharmacist discussed medicines with him extensively.  HPI: this is a transition of care with reconciliation of medicines.  Patient was admitted to [facility] 12/04/2019.admitted with cellulitis , acute kidney failure, edema and PAF. He was no transferred to [facility] due to no beds.  His creatinine was up to 4.8The renal failure improved with diuresis and treatment.  He was put on amiodarone 200mg  bid .  His coumadin was continued.  He was discharged on 12/13/2019  He was readmitted 12/14/2019 with atrial fibrillation and cellulitis, CHADS2VASC is 6.  He was readmitted to [facility] on 12/14/2019 with BP 190/77.  He was further diuresed or creatinine 2.45.  He was found to have HFpEF, ACE stopped, amiodarone used for rate control and beta blockers stopped.  His leg infection was treated with antibiotics. His DM was out of control.  He was discharged 12/27/2019 and transferred.   He was at [facility] for rehabilitation to star him waking again.  He was discharged there 01/15/2020   He has not had nurse come out yet.  They were dressing his legs to prevent infection and breakdown. Current Outpatient Medications on File Prior to Visit  Medication Sig Dispense Refill  . albuterol (PROVENTIL) (2.5 MG/3ML) 0.083% nebulizer solution Inhale 2.5 mg into the lungs every 2 (two) hours.    Marland Kitchen albuterol (VENTOLIN HFA) 108 (90 Base) MCG/ACT inhaler Inhale 1-2 puffs into the lungs every 4 (four) hours as needed for wheezing or shortness of breath.    . allopurinol (ZYLOPRIM) 300 MG tablet Take 1 tablet (300 mg total) by mouth daily. 90 tablet 1  . carvedilol (COREG) 3.125 MG tablet Take 3.125 mg by mouth 2  (two) times daily with a meal.    . diltiazem (CARDIZEM) 60 MG tablet Take 60 mg by mouth 2 (two) times daily.    Marland Kitchen EPINEPHrine 0.3 mg/0.3 mL IJ SOAJ injection Inject 0.3 mg into the muscle as needed for anaphylaxis.    . fesoterodine (TOVIAZ) 4 MG TB24 tablet Take 4 mg by mouth daily.    . fluticasone (FLONASE) 50 MCG/ACT nasal spray Place 1 spray into both nostrils daily. 18.2 mL 2  . furosemide (LASIX) 20 MG tablet Take 20 mg by mouth.    . gabapentin (NEURONTIN) 300 MG capsule Take 1 capsule (300 mg total) by mouth at bedtime. 90 capsule 2  . metFORMIN (GLUCOPHAGE) 1000 MG tablet Take 1 tablet (1,000 mg total) by mouth 2 (two) times daily with a meal. 180 tablet 2  . oxyCODONE-acetaminophen (PERCOCET) 10-325 MG tablet Take 1 tablet by mouth every 6 (six) hours as needed for pain.    . polyethylene glycol powder (MIRALAX) 17 GM/SCOOP powder Take 1 Container by mouth once.    . pravastatin (PRAVACHOL) 40 MG tablet Take 1 tablet (40 mg total) by mouth daily. 90 tablet 2  . triamcinolone cream (KENALOG) 0.1 % APPLY TO THE AFFECTED AREA(S) TWICE DAILY AS NEEDED 454 g 2  . warfarin (COUMADIN) 4 MG tablet Take 1 tablet (4 mg total) by mouth daily. 90 tablet 5   No current facility-administered medications on file prior to visit.   Past Medical History:  Diagnosis Date  .  Absence epileptic syndrome, not intractable, without status epilepticus ([place]) 07/23/2019  . Acute renal failure superimposed on stage 3 chronic kidney disease ([place]) 07/23/2019  . Atherosclerotic heart disease 07/23/2019  . Atrial fibrillation with RVR ([place]) 12/14/2019  . Benign enlargement of prostate 07/23/2019  . Body mass index 50.0-59.9, adult ([place]) 07/23/2019  . Cellulitis of leg 12/14/2019  . Chronic respiratory failure with hypoxia ([place]) 07/23/2019  . Chronic venous hypertension (idiopathic) with ulcer of left lower extremity ([place]) 07/23/2019  . COPD (chronic obstructive pulmonary disease) ([place]) 07/23/2019  . Diabetic  glomerulopathy ([place]) 10/15/2019  . Diabetic neuropathy ([place]) 07/23/2019  . 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[place]) 10/15/2019  . Epilepsy, grand mal ([place])   . Essential hypertension 01/22/2018  . Frozen shoulder    Right Shoulder  . Gout attack 10/06/2019  . H/O acute myocardial infarction 01/22/2018  . H/O: stroke 01/22/2018  . History of DVT (deep vein thrombosis) 01/22/2018  . Incontinence without sensory awareness 07/23/2019  . Long term (current) use of anticoagulants 07/23/2019  . Lumbago   . Lymphedema of both lower extremities 01/22/2018  . Mixed hyperlipidemia 07/23/2019  . Morbid obesity ([place]) 07/23/2019  . Obstructive sleep apnea 07/23/2019  . Paroxysmal atrial fibrillation ([place]) 01/07/2020  . Peripheral vascular disease ([place])   . Peripheral vascular disease, unspecified ([place]) 07/12/2011  . Secondary hypercoagulable state ([place]) 01/07/2020  . Shoulder bursitis 07/25/2019  . Stroke ([place])   . Urinary incontinence 07/25/2019   Past Surgical History:  Procedure Laterality Date  . CORONARY STENT PLACEMENT      Family History  Problem Relation Age of Onset  . Cancer Mother   . Stroke Father   . Hypertension Other   . Stroke Other   . Hyperlipidemia Other   . Alcohol abuse Other   . Heart disease Other   . Cancer Other    Social History   Socioeconomic History  . Marital status: Divorced    Spouse name: Not on file  . Number of children: 2  . Years of education: Not on file  . Highest education level: Not on file  Occupational History  . Occupation: Disability  Tobacco Use  . Smoking status: Former Smoker    Quit date: 10/28/2009    Years since quitting: 10.2  . Smokeless tobacco: Never Used  Substance and Sexual Activity  . Alcohol use: Not Currently    Comment: occassionally  . Drug use: No  . Sexual activity: Not Currently  Other Topics Concern  . Not on file  Social History Narrative  . Not on file   Social Determinants of Health   Financial Resource Strain:   . Difficulty  of Paying Living Expenses: Not on file  Food Insecurity:   . Worried About Charity fundraiser in the Last Year: Not on file  . Ran Out of Food in the Last Year: Not on file  Transportation Needs:   . Lack of Transportation (Medical): Not on file  . Lack of Transportation (Non-Medical): Not on file  Physical Activity: Inactive  . Days of Exercise per Week: 0 days  . Minutes of Exercise per Session: 0 min  Stress:   . Feeling of Stress : Not on file  Social Connections:   . Frequency of Communication with Friends and Family: Not on file  . Frequency of Social Gatherings with Friends and Family: Not on file  . Attends Religious Services: Not on file  . Active Member of Clubs or Organizations: Not on file  .  Attends Archivist Meetings: Not on file  . Marital Status: Not on file    Review of Systems  Constitutional: Positive for fatigue.  HENT: Negative.   Eyes: Negative.   Respiratory: Positive for shortness of breath.   Cardiovascular: Negative.   Gastrointestinal: Negative.   Endocrine: Negative.   Genitourinary: Negative.   Musculoskeletal: Negative.   Neurological: Negative.   Psychiatric/Behavioral: Positive for confusion.     Objective:  BP 102/64   Pulse 66   Temp 98.1 F (36.7 C)   Ht 6' (1.829 m)   Wt 296 lb (134.3 kg)   SpO2 95%   BMI 40.14 kg/m   BP/Weight 01/16/2020 01/07/2020 2/29/7989  Systolic BP 211 941 740  Diastolic BP 64 80 70  Wt. (Lbs) 296 296.2 296.08  BMI 40.14 40.17 -    Physical Exam Vitals reviewed.  Constitutional:      Appearance: Normal appearance. He is obese.  HENT:     Head: Normocephalic.     Right Ear: Tympanic membrane, ear canal and external ear normal.     Left Ear: Tympanic membrane, ear canal and external ear normal.     Nose: Nose normal.     Mouth/Throat:     Mouth: Mucous membranes are moist.  Eyes:     Extraocular Movements: Extraocular movements intact.     Pupils: Pupils are equal, round, and  reactive to light.  Cardiovascular:     Rate and Rhythm: Normal rate. Rhythm irregular.     Pulses: Normal pulses.     Heart sounds: Normal heart sounds.  Pulmonary:     Effort: Pulmonary effort is normal.     Breath sounds: Normal breath sounds.  Abdominal:     General: Bowel sounds are normal.     Palpations: Abdomen is soft.  Skin:    Capillary Refill: Capillary refill takes 2 to 3 seconds.     Comments: He has chronic lymphedema both legs with chronic unna boots to prevent breakdown  Neurological:     General: No focal deficit present.     Mental Status: He is alert.  Psychiatric:        Mood and Affect: Mood normal.     Diabetic Foot Exam - Simple   Simple Foot Form Diabetic Foot exam was performed with the following findings: Yes 01/16/2020  3:16 PM  Visual Inspection See comments: Yes Sensation Testing See comments: Yes Pulse Check Posterior Tibialis and Dorsalis pulse intact bilaterally: Yes Comments Ulceration both lower legs, no monofilament sensation feet      Lab Results  Component Value Date   WBC 11.6 (H) 12/25/2019   HGB 9.5 (L) 12/25/2019   HCT 30.9 (L) 12/25/2019   PLT 315 12/25/2019   GLUCOSE 155 (H) 12/26/2019   CHOL 140 10/15/2019   TRIG 290 (H) 10/15/2019   HDL 34 (L) 10/15/2019   LDLCALC 60 10/15/2019   ALT 9 12/14/2019   AST 14 (L) 12/14/2019   NA 137 12/26/2019   K 3.6 12/26/2019   CL 89 (L) 12/26/2019   CREATININE 2.90 (H) 12/26/2019   BUN 54 (H) 12/26/2019   CO2 34 (H) 12/26/2019   INR 2.4 (H) 12/27/2019   HGBA1C 8.3 (H) 12/14/2019   MICROALBUR 150 10/15/2019      Assessment & Plan:   1. Shortness of breath Patient has chronic dyspnea due to COPD and CHF.  He is on continuous O2 with minimal exercise ability  2. Essential hypertension - hydrALAZINE (APRESOLINE) 100  MG tablet; Take 1 tablet (100 mg total) by mouth 3 (three) times daily.  Dispense: 270 tablet; Refill: 2 - CBC with Differential/Platelet An individual  hypertension care plan was established and reinforced today.  The patient's status was assessed using clinical findings on exam and labs or diagnostic tests. The patient's success at meeting treatment goals on disease specific evidence-based guidelines and found to be fair controlled. SELF MANAGEMENT: The patient and I together assessed ways to personally work towards obtaining the recommended goals. RECOMMENDATIONS: avoid decongestants found in common cold remedies, decrease consumption of alcohol, perform routine monitoring of BP with home BP cuff, exercise, reduction of dietary salt, take medicines as prescribed, try not to miss doses and quit smoking.  Regular exercise and maintaining a healthy weight is needed.  Stress reduction may help. A CLINICAL SUMMARY including written plan identify barriers to care unique to individual due to social or financial issues.  We attempt to mutually creat solutions for individual and family understanding.  3. Atrial fibrillation with RVR (HCC) - amiodarone (PACERONE) 100 MG tablet; Take 2 tablets (200 mg total) by mouth daily.  Dispense: 360 tablet; Refill: 2 AN INDIVIDUAL CARE PLAN for atrial fibrillation was established and reinforced today.  The patient's status was assessed using clinical findings on exam, labs, and other diagnostic testing. Patient's success at meeting treatment goals based on disease specific evidence-bassed guidelines and found to be in fair control. RECOMMENDATIONS include maintaiing present medicines and treatment.  4. Epilepsy, grand mal ([place]) - phenytoin (DILANTIN) 300 MG ER capsule; Take 1 capsule (300 mg total) by mouth 2 (two) times daily. Patient currently on 100 mg 2 times a day  Dispense: 180 capsule; Refill: 2 AN INDIVIDUAL CARE PLAN for seizures was established and reinforced today.  The patient's status was assessed using clinical findings on exam, labs, and other diagnostic testing. Patient's success at meeting treatment goals  based on disease specific evidence-bassed guidelines and found to be in good control. RECOMMENDATIONS include maintaining previous  medicines and treatment.  5. Atherosclerosis of native coronary artery of native heart with stable angina pectoris (HCC) - isosorbide mononitrate (IMDUR) 120 MG 24 hr tablet; Take 1 tablet (120 mg total) by mouth daily.  Dispense: 90 tablet; Refill: 2 Patient's CAD was assessed using history and physical along with other information to maximize treatment.  Evidence based criteria was use in deciding proper management for this disease process.  Patient's CAD is under good control.therapy continue present medicine.  6. Chronic heart failure with preserved ejection fraction (HCC) - torsemide (DEMADEX) 20 MG tablet; Take 3 tablets (60 mg total) by mouth 2 (two) times daily.  Dispense: 180 tablet; Refill: 2 An individualized care plan was established and reinforced.  The patient's disease status was assessed using clinical finding son exam today, labs, and/or other diagnostic testing such as x-rays, to determine the patient's success in meeting treatmentgoalsbased on disease-based guidelines and found to bestable. But not at goal yet. Medications prescriptions no changes Laboratory tests ordered to be performed today include BNP. RECOMMENDATIONS: given include see cardiology.  Call physician is patient gains 3 lbs in one day or 5 lbs for one week.  Call for progressive PND, orthopnea or increased pedal edema. - Brain natriuretic peptide  7. Chronic respiratory failure with hypoxia (HCC) AN INDIVIDUAL CARE PLAN for respiratory failure was established and reinforced today.  The patient's status was assessed using clinical findings on exam, labs, and other diagnostic testing. Patient's success at meeting treatment goals based  on disease specific evidence-bassed guidelines and found to be in stable control. RECOMMENDATIONS include maintaining present medicines and  treatment.  8. Diabetic glomerulopathy (HCC) - Hemoglobin A1c An individual care plan for diabetes with renal failure was established and reinforced today.  The patient's status was assessed using clinical findings on exam, labs and diagnostic testing. Patient success at meeting goals based on disease specific evidence-based guidelines and found to be fair controlled. Renal function 3 Medications were assessed and patient's understanding of the medical issues , including barriers were assessed. Recommend adherence to a diabetic diet, a graduated exercise program, HgbA1c level is checked quarterly, and urine microalbumin performed yearly .  Annual mono-filament sensation testing performed. Lower blood pressure and control hyperlipidemia is important. Get annual eye exams and annual flu shots and smoking cessation discussed.  Self management goals were discussed.  9. Other diabetic neurological complication associated with type 2 diabetes mellitus ([place]) - Comprehensive metabolic panel An individual care plan for diabetes was established and reinforced today.  The patient's status was assessed using clinical findings on exam, labs and diagnostic testing. Patient success at meeting goals based on disease specific evidence-based guidelines and found to be fair controlled. Medications were assessed and patient's understanding of the medical issues , including barriers were assessed. Recommend adherence to a diabetic diet, a graduated exercise program, HgbA1c level is checked quarterly, and urine microalbumin performed yearly .  Annual mono-filament sensation testing performed. Lower blood pressure and control hyperlipidemia is important. Get annual eye exams and annual flu shots and smoking cessation discussed.  Self management goals were discussed.  10. Acute renal failure superimposed on stage 3a chronic kidney disease, unspecified acute renal failure type ([facility]) AN INDIVIDUAL CARE PLAN for renal  failurewas established and reinforced today.  The patient's status was assessed using clinical findings on exam, labs, and other diagnostic testing. Patient's success at meeting treatment goals based on disease specific evidence-bassed guidelines and found to be in fair control. RECOMMENDATIONS include maintaining present medicines and treatment. 55. Long term (current) use of anticoagulants Patient is on long term coumadin, yesterdays INR 5.2, coumadin must be helpd until recheck on Monday and then we can return to his old stable 4 mg dose  12. Mixed hyperlipidemia - Lipid panel AN INDIVIDUAL CARE PLAN for hyperlipidemia/ cholesterol was established and reinforced today.  The patient's status was assessed using clinical findings on exam, lab and other diagnostic tests. The patient's disease status was assessed based on evidence-based guidelines and found to be fair controlled. MEDICATIONS were reviewed. SELF MANAGEMENT GOALS have been discussed and patient's success at attaining the goal of low cholesterol was assessed. RECOMMENDATION given include regular exercise 3 days a week and low cholesterol/low fat diet. CLINICAL SUMMARY including written plan to identify barriers unique to the patient due to social or economic  reasons was discussed.  61. Morbid obesity ([place]) An individualize plan was formulated for obesity using patient history and physical exam to encourage weight loss.  An evidence based program was formulated.  Patient is to cut portion size with meals and to plan physical exercise 3 days a week at least 20 minutes.  Weight watchers and other programs are helpful.  Planned amount of weight loss 10 lbs.He was found to have a dozen donuts in car.  14. Secondary hypercoagulable state ([place]) Patient has PAF and requires anticoagualtion    Meds ordered this encounter  Medications  . amiodarone (PACERONE) 100 MG tablet    Sig: Take 2 tablets (200  mg total) by mouth daily.    Dispense:   360 tablet    Refill:  2  . hydrALAZINE (APRESOLINE) 100 MG tablet    Sig: Take 1 tablet (100 mg total) by mouth 3 (three) times daily.    Dispense:  270 tablet    Refill:  2  . phenytoin (DILANTIN) 300 MG ER capsule    Sig: Take 1 capsule (300 mg total) by mouth 2 (two) times daily. Patient currently on 100 mg 2 times a day    Dispense:  180 capsule    Refill:  2  . isosorbide mononitrate (IMDUR) 120 MG 24 hr tablet    Sig: Take 1 tablet (120 mg total) by mouth daily.    Dispense:  90 tablet    Refill:  2  . torsemide (DEMADEX) 20 MG tablet    Sig: Take 3 tablets (60 mg total) by mouth 2 (two) times daily.    Dispense:  180 tablet    Refill:  2    Orders Placed This Encounter  Procedures  . CBC with Differential/Platelet  . Comprehensive metabolic panel  . Lipid panel  . Hemoglobin A1c  . Brain natriuretic peptide     Follow-up: Return in about 1 month (around 02/16/2020) for fasting.  An After Visit Summary was printed and given to the patient.  [place] [phone]

## 2020-01-17 ENCOUNTER — Ambulatory Visit: Payer: Medicare HMO | Admitting: Cardiology

## 2020-01-17 ENCOUNTER — Other Ambulatory Visit: Payer: Self-pay

## 2020-01-17 ENCOUNTER — Encounter: Payer: Self-pay | Admitting: Cardiology

## 2020-01-17 VITALS — BP 112/70 | HR 55 | Ht 72.0 in | Wt 298.0 lb

## 2020-01-17 DIAGNOSIS — N39498 Other specified urinary incontinence: Secondary | ICD-10-CM | POA: Diagnosis not present

## 2020-01-17 DIAGNOSIS — Z86718 Personal history of other venous thrombosis and embolism: Secondary | ICD-10-CM | POA: Diagnosis not present

## 2020-01-17 DIAGNOSIS — I890 Lymphedema, not elsewhere classified: Secondary | ICD-10-CM | POA: Diagnosis not present

## 2020-01-17 DIAGNOSIS — L97929 Non-pressure chronic ulcer of unspecified part of left lower leg with unspecified severity: Secondary | ICD-10-CM | POA: Diagnosis not present

## 2020-01-17 DIAGNOSIS — E1159 Type 2 diabetes mellitus with other circulatory complications: Secondary | ICD-10-CM | POA: Diagnosis not present

## 2020-01-17 DIAGNOSIS — G40319 Generalized idiopathic epilepsy and epileptic syndromes, intractable, without status epilepticus: Secondary | ICD-10-CM | POA: Diagnosis not present

## 2020-01-17 DIAGNOSIS — E1122 Type 2 diabetes mellitus with diabetic chronic kidney disease: Secondary | ICD-10-CM | POA: Diagnosis not present

## 2020-01-17 DIAGNOSIS — N1832 Chronic kidney disease, stage 3b: Secondary | ICD-10-CM | POA: Diagnosis not present

## 2020-01-17 DIAGNOSIS — I87312 Chronic venous hypertension (idiopathic) with ulcer of left lower extremity: Secondary | ICD-10-CM | POA: Diagnosis not present

## 2020-01-17 DIAGNOSIS — J449 Chronic obstructive pulmonary disease, unspecified: Secondary | ICD-10-CM | POA: Diagnosis not present

## 2020-01-17 DIAGNOSIS — E1151 Type 2 diabetes mellitus with diabetic peripheral angiopathy without gangrene: Secondary | ICD-10-CM | POA: Diagnosis not present

## 2020-01-17 DIAGNOSIS — I480 Paroxysmal atrial fibrillation: Secondary | ICD-10-CM | POA: Diagnosis not present

## 2020-01-17 DIAGNOSIS — Z8673 Personal history of transient ischemic attack (TIA), and cerebral infarction without residual deficits: Secondary | ICD-10-CM

## 2020-01-17 DIAGNOSIS — I252 Old myocardial infarction: Secondary | ICD-10-CM

## 2020-01-17 DIAGNOSIS — I739 Peripheral vascular disease, unspecified: Secondary | ICD-10-CM

## 2020-01-17 DIAGNOSIS — I2510 Atherosclerotic heart disease of native coronary artery without angina pectoris: Secondary | ICD-10-CM | POA: Diagnosis not present

## 2020-01-17 DIAGNOSIS — E1142 Type 2 diabetes mellitus with diabetic polyneuropathy: Secondary | ICD-10-CM | POA: Diagnosis not present

## 2020-01-17 NOTE — Progress Notes (Signed)
Cardiology Consultation:    Date:  01/17/2020   ID:  Matthew Gutierrez, DOB August 19, 1955, MRN [id number]  PCP:  Lillard Anes, MD  Cardiologist:  Jenne Campus, MD   Referring MD: Oliver Barre, PA   No chief complaint on file. I have atrial fibrillation  History of Present Illness:    Matthew Gutierrez is a 64 y.o. male who is being seen today for the evaluation of paroxysmal atrial fibrillation at the request of Fenton, Clint R, PA.  With past medical history significant for paroxysmal atrial fibrillation, DVT, lymphedema, obesity, peripheral vascular disease, chronic kidney failure.  Recently he was in the hospital because of cellulitis.  There was treated aggressively with antibiotics, he eventually started having worsening of kidney function with creatinine up to 4.8, that was corrected with IV fluids as well as supportive care.  He was also find to have atrial fibrillation with rapid response.  Treated with Cardizem carvedilol as well as amiodarone.  He comes today to my office for follow-up after being discharged from hospital in from a rehab center when he spent few weeks.  He is very upset today he disappointed he said this is one of the worst day of his life.  He comes with a male companion who tells me that he is always that way.  He said he is does not feel well he is upset because he cannot walk.  She thinks that he simply does not put enough effort to do any exercises.  He is being at home for 2days and he was trying to walk a little bit and he could not do it because of weakness and fatigue in his legs.  Denies have any palpitations, no chest pain tightness squeezing pressure burning chest.  Past Medical History:  Diagnosis Date   Absence epileptic syndrome, not intractable, without status epilepticus ([place]) 07/23/2019   Acute renal failure superimposed on stage 3 chronic kidney disease ([place]) 07/23/2019   Atherosclerotic heart disease 07/23/2019   Atrial fibrillation  with RVR ([place]) 12/14/2019   Benign enlargement of prostate 07/23/2019   Body mass index 50.0-59.9, adult ([place]) 07/23/2019   Cellulitis of leg 12/14/2019   Chronic respiratory failure with hypoxia ([place]) 07/23/2019   Chronic venous hypertension (idiopathic) with ulcer of left lower extremity ([place]) 07/23/2019   COPD (chronic obstructive pulmonary disease) ([place]) 07/23/2019   Diabetic glomerulopathy ([place]) 10/15/2019   Diabetic neuropathy ([place]) 07/23/2019   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[place]) 10/15/2019   Epilepsy, grand mal ([place])    Essential hypertension 01/22/2018   Frozen shoulder    Right Shoulder   Gout attack 10/06/2019   H/O acute myocardial infarction 01/22/2018   H/O: stroke 01/22/2018   History of DVT (deep vein thrombosis) 01/22/2018   Incontinence without sensory awareness 07/23/2019   Long term (current) use of anticoagulants 07/23/2019   Lumbago    Lymphedema of both lower extremities 01/22/2018   Mixed hyperlipidemia 07/23/2019   Morbid obesity ([place]) 07/23/2019   Obstructive sleep apnea 07/23/2019   Paroxysmal atrial fibrillation ([place]) 01/07/2020   Peripheral vascular disease ([place])    Peripheral vascular disease, unspecified ([place]) 07/12/2011   Secondary hypercoagulable state ([place]) 01/07/2020   Shoulder bursitis 07/25/2019   Stroke ([place])    Urinary incontinence 07/25/2019    Past Surgical History:  Procedure Laterality Date   CORONARY STENT PLACEMENT      Current Medications: Current Meds  Medication Sig   albuterol (PROVENTIL) (2.5 MG/3ML) 0.083% nebulizer solution Inhale 2.5 mg into the lungs  every 2 (two) hours.   albuterol (VENTOLIN HFA) 108 (90 Base) MCG/ACT inhaler Inhale 1-2 puffs into the lungs every 4 (four) hours as needed for wheezing or shortness of breath.   allopurinol (ZYLOPRIM) 300 MG tablet Take 1 tablet (300 mg total) by mouth daily.   amiodarone (PACERONE) 100 MG tablet Take 2 tablets (200 mg total) by mouth daily.   carvedilol (COREG) 3.125  MG tablet Take 3.125 mg by mouth 2 (two) times daily with a meal.   diltiazem (CARDIZEM) 60 MG tablet Take 60 mg by mouth 2 (two) times daily.   EPINEPHrine 0.3 mg/0.3 mL IJ SOAJ injection Inject 0.3 mg into the muscle as needed for anaphylaxis.   fesoterodine (TOVIAZ) 4 MG TB24 tablet Take 4 mg by mouth daily.   fluticasone (FLONASE) 50 MCG/ACT nasal spray Place 1 spray into both nostrils daily.   furosemide (LASIX) 20 MG tablet Take 20 mg by mouth.   gabapentin (NEURONTIN) 300 MG capsule Take 1 capsule (300 mg total) by mouth at bedtime.   hydrALAZINE (APRESOLINE) 100 MG tablet Take 1 tablet (100 mg total) by mouth 3 (three) times daily.   isosorbide mononitrate (IMDUR) 120 MG 24 hr tablet Take 1 tablet (120 mg total) by mouth daily.   metFORMIN (GLUCOPHAGE) 1000 MG tablet Take 1 tablet (1,000 mg total) by mouth 2 (two) times daily with a meal.   oxyCODONE-acetaminophen (PERCOCET) 10-325 MG tablet Take 1 tablet by mouth every 6 (six) hours as needed for pain.   phenytoin (DILANTIN) 300 MG ER capsule Take 1 capsule (300 mg total) by mouth 2 (two) times daily. Patient currently on 100 mg 2 times a day   polyethylene glycol powder (MIRALAX) 17 GM/SCOOP powder Take 1 Container by mouth once.   pravastatin (PRAVACHOL) 40 MG tablet Take 1 tablet (40 mg total) by mouth daily.   torsemide (DEMADEX) 20 MG tablet Take 3 tablets (60 mg total) by mouth 2 (two) times daily.   triamcinolone cream (KENALOG) 0.1 % APPLY TO THE AFFECTED AREA(S) TWICE DAILY AS NEEDED   warfarin (COUMADIN) 4 MG tablet Take 1 tablet (4 mg total) by mouth daily.     Allergies:   Codeine   Social History   Socioeconomic History   Marital status: Divorced    Spouse name: Not on file   Number of children: 2   Years of education: Not on file   Highest education level: Not on file  Occupational History   Occupation: Disability  Tobacco Use   Smoking status: Former Smoker    Quit date: 10/28/2009     Years since quitting: 10.2   Smokeless tobacco: Never Used  Substance and Sexual Activity   Alcohol use: Not Currently    Comment: occassionally   Drug use: No   Sexual activity: Not Currently  Other Topics Concern   Not on file  Social History Narrative   Not on file   Social Determinants of Health   Financial Resource Strain:    Difficulty of Paying Living Expenses: Not on file  Food Insecurity:    Worried About Charity fundraiser in the Last Year: Not on file   [place] in the Last Year: Not on file  Transportation Needs:    Lack of Transportation (Medical): Not on file   Lack of Transportation (Non-Medical): Not on file  Physical Activity: Inactive   Days of Exercise per Week: 0 days   Minutes of Exercise per Session: 0 min  Stress:  Feeling of Stress : Not on file  Social Connections:    Frequency of Communication with Friends and Family: Not on file   Frequency of Social Gatherings with Friends and Family: Not on file   Attends Religious Services: Not on file   Active Member of Clubs or Organizations: Not on file   Attends Archivist Meetings: Not on file   Marital Status: Not on file     Family History: The patient's family history includes Alcohol abuse in an other family member; Cancer in his mother and another family member; Heart disease in an other family member; Hyperlipidemia in an other family member; Hypertension in an other family member; Stroke in his father and another family member. ROS:   Please see the history of present illness.    All 14 point review of systems negative except as described per history of present illness.  EKGs/Labs/Other Studies Reviewed:    The following studies were reviewed today:   EKG:  EKG is  ordered today.  The ekg ordered today demonstrates sinus bradycardia at rate of 55, normal P interval, low voltage EKG cannot rule out anterior MI, incomplete right bundle branch  block.  Recent Labs: 12/14/2019: ALT 9 12/15/2019: B Natriuretic Peptide 240.9 12/25/2019: Hemoglobin 9.5; Magnesium 1.6; Platelets 315 12/26/2019: BUN 54; Creatinine, Ser 2.90; Potassium 3.6; Sodium 137  Recent Lipid Panel    Component Value Date/Time   CHOL 140 10/15/2019 1014   TRIG 290 (H) 10/15/2019 1014   HDL 34 (L) 10/15/2019 1014   CHOLHDL 4.1 10/15/2019 1014   LDLCALC 60 10/15/2019 1014    Physical Exam:    VS:  BP 112/70    Pulse (!) 55    Ht 6' (1.829 m)    Wt 298 lb (135.2 kg)    SpO2 97%    BMI 40.42 kg/m     Wt Readings from Last 3 Encounters:  01/17/20 298 lb (135.2 kg)  01/16/20 296 lb (134.3 kg)  01/07/20 296 lb 3.2 oz (134.4 kg)     GEN:  Well nourished, well developed in no acute distress HEENT: Normal NECK: No JVD; No carotid bruits LYMPHATICS: No lymphadenopathy CARDIAC: RRR, no murmurs, no rubs, no gallops RESPIRATORY:  Clear to auscultation without rales, wheezing or rhonchi  ABDOMEN: Soft, non-tender, non-distended MUSCULOSKELETAL:  No edema; No deformity, his legs are wrapped in bandages. SKIN: Warm and dry NEUROLOGIC:  Alert and oriented x 3 PSYCHIATRIC:  Normal affect   ASSESSMENT:    1. Paroxysmal atrial fibrillation (HCC)   2. Peripheral vascular disease ([place])   3. Chronic venous hypertension (idiopathic) with ulcer of left lower extremity (HCC)   4. 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[place])   5. Morbid obesity ([place])   6. History of DVT (deep vein thrombosis)   7. H/O: stroke   8. H/O acute myocardial infarction    PLAN:    In order of problems listed above:  1. Paroxysmal atrial fibrillation successfully suppressed with amiodarone he takes 100 mg daily which I will continue.  His anticoagulation has been maintained with Coumadin which I will continue.  I apparently there is some misunderstanding about his medication he is compliant thinks that he takes both Coumadin and warfarin.  He is not sure about it.  Yesterday he got INR done by his primary  care physician, I was looking for the results of it but I do not have it yet.  I told him he need to take either Coumadin or warfarin but not  both.  He will check his medications. 2. Peripheral vascular disease advanced but stable.  We will continue present management which is conservative at that time. 3. Chronic venous hypertension involving lower extremities.  He does have physical therapy is coming home twice a week and really wrapping his legs. 4. History of CVA.  No new problems. 5. History of DVT, maintained on anticoagulation. 6. Coronary artery disease stable from that point review.   Medication Adjustments/Labs and Tests Ordered: Current medicines are reviewed at length with the patient today.  Concerns regarding medicines are outlined above.  No orders of the defined types were placed in this encounter.  No orders of the defined types were placed in this encounter.   Signed, Park Liter, MD, [facility]. 01/17/2020 4:27 PM    [place]

## 2020-01-17 NOTE — Patient Instructions (Signed)
Medication Instructions:  Your physician recommends that you continue on your current medications as directed. Please refer to the Current Medication list given to you today.  *If you need a refill on your cardiac medications before your next appointment, please call your pharmacy*   Lab Work: None If you have labs (blood work) drawn today and your tests are completely normal, you will receive your results only by: MyChart Message (if you have MyChart) OR A paper copy in the mail If you have any lab test that is abnormal or we need to change your treatment, we will call you to review the results.   Testing/Procedures: None   Follow-Up: At [facility], you and your health needs are our priority.  As part of our continuing mission to provide you with exceptional heart care, we have created designated Provider Care Teams.  These Care Teams include your primary Cardiologist (physician) and Advanced Practice Providers (APPs -  Physician Assistants and Nurse Practitioners) who all work together to provide you with the care you need, when you need it.  We recommend signing up for the patient portal called "MyChart".  Sign up information is provided on this After Visit Summary.  MyChart is used to connect with patients for Virtual Visits (Telemedicine).  Patients are able to view lab/test results, encounter notes, upcoming appointments, etc.  Non-urgent messages can be sent to your provider as well.   To learn more about what you can do with MyChart, go to [email or web address].    Your next appointment:   2 month(s)  The format for your next appointment:   In Person  Provider:   Robert Krasowski, MD   Other Instructions   

## 2020-01-18 LAB — LIPID PANEL
Chol/HDL Ratio: 2.2 ratio (ref 0.0–5.0)
Cholesterol, Total: 101 mg/dL (ref 100–199)
HDL: 45 mg/dL (ref >39)
LDL Chol Calc (NIH): 37 mg/dL (ref 0–99)
Triglycerides: 101 mg/dL (ref 0–149)
VLDL Cholesterol Cal: 19 mg/dL (ref 5–40)

## 2020-01-18 LAB — COMPREHENSIVE METABOLIC PANEL
ALT: 13 IU/L (ref 0–44)
AST: 17 IU/L (ref 0–40)
Albumin/Globulin Ratio: 1 — ABNORMAL LOW (ref 1.2–2.2)
Albumin: 3.3 g/dL — ABNORMAL LOW (ref 3.8–4.8)
Alkaline Phosphatase: 208 IU/L — ABNORMAL HIGH (ref 48–121)
BUN/Creatinine Ratio: 19 (ref 10–24)
BUN: 42 mg/dL — ABNORMAL HIGH (ref 8–27)
Bilirubin Total: 0.3 mg/dL (ref 0.0–1.2)
CO2: 30 mmol/L — ABNORMAL HIGH (ref 20–29)
Calcium: 7 mg/dL — ABNORMAL LOW (ref 8.6–10.2)
Chloride: 90 mmol/L — ABNORMAL LOW (ref 96–106)
Creatinine, Ser: 2.2 mg/dL — ABNORMAL HIGH (ref 0.76–1.27)
GFR calc Af Amer: 35 mL/min/{1.73_m2} — ABNORMAL LOW (ref >59)
GFR calc non Af Amer: 30 mL/min/{1.73_m2} — ABNORMAL LOW (ref >59)
Globulin, Total: 3.3 g/dL (ref 1.5–4.5)
Glucose: 143 mg/dL — ABNORMAL HIGH (ref 65–99)
Potassium: 3.7 mmol/L (ref 3.5–5.2)
Sodium: 138 mmol/L (ref 134–144)
Total Protein: 6.6 g/dL (ref 6.0–8.5)

## 2020-01-18 LAB — CBC WITH DIFFERENTIAL/PLATELET
Basophils Absolute: 0.1 10*3/uL (ref 0.0–0.2)
Basos: 1 %
EOS (ABSOLUTE): 0.1 10*3/uL (ref 0.0–0.4)
Eos: 1 %
Hematocrit: 32.3 % — ABNORMAL LOW (ref 37.5–51.0)
Hemoglobin: 10.6 g/dL — ABNORMAL LOW (ref 13.0–17.7)
Immature Grans (Abs): 0 10*3/uL (ref 0.0–0.1)
Immature Granulocytes: 0 %
Lymphocytes Absolute: 1.4 10*3/uL (ref 0.7–3.1)
Lymphs: 9 %
MCH: 28.3 pg (ref 26.6–33.0)
MCHC: 32.8 g/dL (ref 31.5–35.7)
MCV: 86 fL (ref 79–97)
Monocytes Absolute: 1.5 10*3/uL — ABNORMAL HIGH (ref 0.1–0.9)
Monocytes: 10 %
Neutrophils Absolute: 12.2 10*3/uL — ABNORMAL HIGH (ref 1.4–7.0)
Neutrophils: 79 %
Platelets: 302 10*3/uL (ref 150–450)
RBC: 3.74 x10E6/uL — ABNORMAL LOW (ref 4.14–5.80)
RDW: 13.4 % (ref 11.6–15.4)
WBC: 15.3 10*3/uL — ABNORMAL HIGH (ref 3.4–10.8)

## 2020-01-18 LAB — BRAIN NATRIURETIC PEPTIDE: BNP: 51.5 pg/mL (ref 0.0–100.0)

## 2020-01-18 LAB — CARDIOVASCULAR RISK ASSESSMENT

## 2020-01-18 LAB — HEMOGLOBIN A1C
Est. average glucose Bld gHb Est-mCnc: 183 mg/dL
Hgb A1c MFr Bld: 8 % — ABNORMAL HIGH (ref 4.8–5.6)

## 2020-01-19 NOTE — Progress Notes (Signed)
[place] is up, anemia improved, glucose 143 hiths, kidney tests stable, low albumen, Liver tests OK, Cholesterol normal, A1c 8.0 still uppr limited for DM, BNP51 no active heart failure lp

## 2020-01-20 DIAGNOSIS — J449 Chronic obstructive pulmonary disease, unspecified: Secondary | ICD-10-CM | POA: Diagnosis not present

## 2020-01-20 DIAGNOSIS — E1142 Type 2 diabetes mellitus with diabetic polyneuropathy: Secondary | ICD-10-CM | POA: Diagnosis not present

## 2020-01-20 DIAGNOSIS — I2510 Atherosclerotic heart disease of native coronary artery without angina pectoris: Secondary | ICD-10-CM | POA: Diagnosis not present

## 2020-01-20 DIAGNOSIS — G40319 Generalized idiopathic epilepsy and epileptic syndromes, intractable, without status epilepticus: Secondary | ICD-10-CM | POA: Diagnosis not present

## 2020-01-20 DIAGNOSIS — N39498 Other specified urinary incontinence: Secondary | ICD-10-CM | POA: Diagnosis not present

## 2020-01-20 DIAGNOSIS — E1122 Type 2 diabetes mellitus with diabetic chronic kidney disease: Secondary | ICD-10-CM | POA: Diagnosis not present

## 2020-01-20 DIAGNOSIS — I890 Lymphedema, not elsewhere classified: Secondary | ICD-10-CM | POA: Diagnosis not present

## 2020-01-20 DIAGNOSIS — N1832 Chronic kidney disease, stage 3b: Secondary | ICD-10-CM | POA: Diagnosis not present

## 2020-01-20 DIAGNOSIS — E1151 Type 2 diabetes mellitus with diabetic peripheral angiopathy without gangrene: Secondary | ICD-10-CM | POA: Diagnosis not present

## 2020-01-22 DIAGNOSIS — I890 Lymphedema, not elsewhere classified: Secondary | ICD-10-CM | POA: Diagnosis not present

## 2020-01-22 DIAGNOSIS — J449 Chronic obstructive pulmonary disease, unspecified: Secondary | ICD-10-CM | POA: Diagnosis not present

## 2020-01-22 DIAGNOSIS — E1151 Type 2 diabetes mellitus with diabetic peripheral angiopathy without gangrene: Secondary | ICD-10-CM | POA: Diagnosis not present

## 2020-01-22 DIAGNOSIS — G40319 Generalized idiopathic epilepsy and epileptic syndromes, intractable, without status epilepticus: Secondary | ICD-10-CM | POA: Diagnosis not present

## 2020-01-22 DIAGNOSIS — N39498 Other specified urinary incontinence: Secondary | ICD-10-CM | POA: Diagnosis not present

## 2020-01-22 DIAGNOSIS — I2510 Atherosclerotic heart disease of native coronary artery without angina pectoris: Secondary | ICD-10-CM | POA: Diagnosis not present

## 2020-01-22 DIAGNOSIS — E1142 Type 2 diabetes mellitus with diabetic polyneuropathy: Secondary | ICD-10-CM | POA: Diagnosis not present

## 2020-01-22 DIAGNOSIS — N1832 Chronic kidney disease, stage 3b: Secondary | ICD-10-CM | POA: Diagnosis not present

## 2020-01-22 DIAGNOSIS — E1122 Type 2 diabetes mellitus with diabetic chronic kidney disease: Secondary | ICD-10-CM | POA: Diagnosis not present

## 2020-01-23 DIAGNOSIS — R110 Nausea: Secondary | ICD-10-CM | POA: Diagnosis not present

## 2020-01-23 DIAGNOSIS — E119 Type 2 diabetes mellitus without complications: Secondary | ICD-10-CM | POA: Diagnosis not present

## 2020-01-23 DIAGNOSIS — R5381 Other malaise: Secondary | ICD-10-CM | POA: Diagnosis not present

## 2020-01-23 DIAGNOSIS — R52 Pain, unspecified: Secondary | ICD-10-CM | POA: Diagnosis not present

## 2020-01-23 DIAGNOSIS — N183 Chronic kidney disease, stage 3 unspecified: Secondary | ICD-10-CM | POA: Diagnosis not present

## 2020-01-23 DIAGNOSIS — R1084 Generalized abdominal pain: Secondary | ICD-10-CM | POA: Diagnosis not present

## 2020-01-23 DIAGNOSIS — R609 Edema, unspecified: Secondary | ICD-10-CM | POA: Diagnosis not present

## 2020-01-23 DIAGNOSIS — I129 Hypertensive chronic kidney disease with stage 1 through stage 4 chronic kidney disease, or unspecified chronic kidney disease: Secondary | ICD-10-CM | POA: Diagnosis not present

## 2020-01-23 DIAGNOSIS — R531 Weakness: Secondary | ICD-10-CM | POA: Diagnosis not present

## 2020-01-23 DIAGNOSIS — R112 Nausea with vomiting, unspecified: Secondary | ICD-10-CM | POA: Diagnosis not present

## 2020-01-24 DIAGNOSIS — I129 Hypertensive chronic kidney disease with stage 1 through stage 4 chronic kidney disease, or unspecified chronic kidney disease: Secondary | ICD-10-CM | POA: Diagnosis not present

## 2020-01-24 DIAGNOSIS — I130 Hypertensive heart and chronic kidney disease with heart failure and stage 1 through stage 4 chronic kidney disease, or unspecified chronic kidney disease: Secondary | ICD-10-CM | POA: Diagnosis not present

## 2020-01-24 DIAGNOSIS — R262 Difficulty in walking, not elsewhere classified: Secondary | ICD-10-CM | POA: Diagnosis not present

## 2020-01-24 DIAGNOSIS — J189 Pneumonia, unspecified organism: Secondary | ICD-10-CM | POA: Diagnosis not present

## 2020-01-24 DIAGNOSIS — T45515A Adverse effect of anticoagulants, initial encounter: Secondary | ICD-10-CM | POA: Diagnosis not present

## 2020-01-24 DIAGNOSIS — Z6841 Body Mass Index (BMI) 40.0 and over, adult: Secondary | ICD-10-CM | POA: Diagnosis not present

## 2020-01-24 DIAGNOSIS — N179 Acute kidney failure, unspecified: Secondary | ICD-10-CM | POA: Diagnosis not present

## 2020-01-24 DIAGNOSIS — N183 Chronic kidney disease, stage 3 unspecified: Secondary | ICD-10-CM | POA: Diagnosis not present

## 2020-01-24 DIAGNOSIS — I517 Cardiomegaly: Secondary | ICD-10-CM | POA: Diagnosis not present

## 2020-01-24 DIAGNOSIS — E1122 Type 2 diabetes mellitus with diabetic chronic kidney disease: Secondary | ICD-10-CM | POA: Diagnosis not present

## 2020-01-24 DIAGNOSIS — J9811 Atelectasis: Secondary | ICD-10-CM | POA: Diagnosis not present

## 2020-01-24 DIAGNOSIS — I5032 Chronic diastolic (congestive) heart failure: Secondary | ICD-10-CM | POA: Diagnosis not present

## 2020-01-24 DIAGNOSIS — D689 Coagulation defect, unspecified: Secondary | ICD-10-CM | POA: Diagnosis not present

## 2020-01-24 DIAGNOSIS — R531 Weakness: Secondary | ICD-10-CM | POA: Diagnosis not present

## 2020-01-28 ENCOUNTER — Other Ambulatory Visit: Payer: Self-pay

## 2020-01-28 DIAGNOSIS — I2510 Atherosclerotic heart disease of native coronary artery without angina pectoris: Secondary | ICD-10-CM | POA: Diagnosis not present

## 2020-01-28 DIAGNOSIS — J449 Chronic obstructive pulmonary disease, unspecified: Secondary | ICD-10-CM | POA: Diagnosis not present

## 2020-01-28 DIAGNOSIS — E1151 Type 2 diabetes mellitus with diabetic peripheral angiopathy without gangrene: Secondary | ICD-10-CM | POA: Diagnosis not present

## 2020-01-28 DIAGNOSIS — N39498 Other specified urinary incontinence: Secondary | ICD-10-CM | POA: Diagnosis not present

## 2020-01-28 DIAGNOSIS — G40319 Generalized idiopathic epilepsy and epileptic syndromes, intractable, without status epilepticus: Secondary | ICD-10-CM | POA: Diagnosis not present

## 2020-01-28 DIAGNOSIS — I890 Lymphedema, not elsewhere classified: Secondary | ICD-10-CM | POA: Diagnosis not present

## 2020-01-28 DIAGNOSIS — E1142 Type 2 diabetes mellitus with diabetic polyneuropathy: Secondary | ICD-10-CM | POA: Diagnosis not present

## 2020-01-28 DIAGNOSIS — E1122 Type 2 diabetes mellitus with diabetic chronic kidney disease: Secondary | ICD-10-CM | POA: Diagnosis not present

## 2020-01-28 DIAGNOSIS — N1832 Chronic kidney disease, stage 3b: Secondary | ICD-10-CM | POA: Diagnosis not present

## 2020-01-28 MED ORDER — FESOTERODINE FUMARATE ER 4 MG PO TB24
4.0000 mg | ORAL_TABLET | Freq: Every day | ORAL | 2 refills | Status: DC
Start: 2020-01-28 — End: 2020-02-05

## 2020-01-29 DIAGNOSIS — E1122 Type 2 diabetes mellitus with diabetic chronic kidney disease: Secondary | ICD-10-CM | POA: Diagnosis not present

## 2020-01-29 DIAGNOSIS — I4891 Unspecified atrial fibrillation: Secondary | ICD-10-CM | POA: Diagnosis not present

## 2020-01-29 DIAGNOSIS — I2510 Atherosclerotic heart disease of native coronary artery without angina pectoris: Secondary | ICD-10-CM | POA: Diagnosis not present

## 2020-01-29 DIAGNOSIS — E1151 Type 2 diabetes mellitus with diabetic peripheral angiopathy without gangrene: Secondary | ICD-10-CM | POA: Diagnosis not present

## 2020-01-29 DIAGNOSIS — I130 Hypertensive heart and chronic kidney disease with heart failure and stage 1 through stage 4 chronic kidney disease, or unspecified chronic kidney disease: Secondary | ICD-10-CM | POA: Diagnosis not present

## 2020-01-29 DIAGNOSIS — I5032 Chronic diastolic (congestive) heart failure: Secondary | ICD-10-CM | POA: Diagnosis not present

## 2020-01-29 DIAGNOSIS — J449 Chronic obstructive pulmonary disease, unspecified: Secondary | ICD-10-CM | POA: Diagnosis not present

## 2020-01-29 DIAGNOSIS — N1832 Chronic kidney disease, stage 3b: Secondary | ICD-10-CM | POA: Diagnosis not present

## 2020-01-29 DIAGNOSIS — I872 Venous insufficiency (chronic) (peripheral): Secondary | ICD-10-CM | POA: Diagnosis not present

## 2020-01-29 DIAGNOSIS — E1142 Type 2 diabetes mellitus with diabetic polyneuropathy: Secondary | ICD-10-CM | POA: Diagnosis not present

## 2020-01-29 DIAGNOSIS — I890 Lymphedema, not elsewhere classified: Secondary | ICD-10-CM | POA: Diagnosis not present

## 2020-01-29 DIAGNOSIS — I5033 Acute on chronic diastolic (congestive) heart failure: Secondary | ICD-10-CM | POA: Diagnosis not present

## 2020-01-30 DIAGNOSIS — I130 Hypertensive heart and chronic kidney disease with heart failure and stage 1 through stage 4 chronic kidney disease, or unspecified chronic kidney disease: Secondary | ICD-10-CM | POA: Diagnosis not present

## 2020-01-30 DIAGNOSIS — I4891 Unspecified atrial fibrillation: Secondary | ICD-10-CM | POA: Diagnosis not present

## 2020-01-30 DIAGNOSIS — E1142 Type 2 diabetes mellitus with diabetic polyneuropathy: Secondary | ICD-10-CM | POA: Diagnosis not present

## 2020-01-30 DIAGNOSIS — E1122 Type 2 diabetes mellitus with diabetic chronic kidney disease: Secondary | ICD-10-CM | POA: Diagnosis not present

## 2020-01-30 DIAGNOSIS — J449 Chronic obstructive pulmonary disease, unspecified: Secondary | ICD-10-CM | POA: Diagnosis not present

## 2020-01-30 DIAGNOSIS — N1832 Chronic kidney disease, stage 3b: Secondary | ICD-10-CM | POA: Diagnosis not present

## 2020-01-30 DIAGNOSIS — I5032 Chronic diastolic (congestive) heart failure: Secondary | ICD-10-CM | POA: Diagnosis not present

## 2020-01-31 ENCOUNTER — Encounter: Payer: Self-pay | Admitting: Legal Medicine

## 2020-01-31 ENCOUNTER — Other Ambulatory Visit: Payer: Self-pay

## 2020-01-31 ENCOUNTER — Ambulatory Visit (INDEPENDENT_AMBULATORY_CARE_PROVIDER_SITE_OTHER): Payer: Medicare HMO | Admitting: Legal Medicine

## 2020-01-31 VITALS — BP 160/90 | HR 104 | Temp 97.7°F | Resp 16

## 2020-01-31 DIAGNOSIS — G40309 Generalized idiopathic epilepsy and epileptic syndromes, not intractable, without status epilepticus: Secondary | ICD-10-CM

## 2020-01-31 DIAGNOSIS — J449 Chronic obstructive pulmonary disease, unspecified: Secondary | ICD-10-CM

## 2020-01-31 DIAGNOSIS — E1122 Type 2 diabetes mellitus with diabetic chronic kidney disease: Secondary | ICD-10-CM | POA: Diagnosis not present

## 2020-01-31 DIAGNOSIS — E1159 Type 2 diabetes mellitus with other circulatory complications: Secondary | ICD-10-CM

## 2020-01-31 DIAGNOSIS — I130 Hypertensive heart and chronic kidney disease with heart failure and stage 1 through stage 4 chronic kidney disease, or unspecified chronic kidney disease: Secondary | ICD-10-CM | POA: Diagnosis not present

## 2020-01-31 DIAGNOSIS — N179 Acute kidney failure, unspecified: Secondary | ICD-10-CM | POA: Diagnosis not present

## 2020-01-31 DIAGNOSIS — I480 Paroxysmal atrial fibrillation: Secondary | ICD-10-CM

## 2020-01-31 DIAGNOSIS — G40409 Other generalized epilepsy and epileptic syndromes, not intractable, without status epilepticus: Secondary | ICD-10-CM

## 2020-01-31 DIAGNOSIS — N1832 Chronic kidney disease, stage 3b: Secondary | ICD-10-CM | POA: Diagnosis not present

## 2020-01-31 DIAGNOSIS — J9611 Chronic respiratory failure with hypoxia: Secondary | ICD-10-CM

## 2020-01-31 DIAGNOSIS — E1149 Type 2 diabetes mellitus with other diabetic neurological complication: Secondary | ICD-10-CM

## 2020-01-31 DIAGNOSIS — N3942 Incontinence without sensory awareness: Secondary | ICD-10-CM

## 2020-01-31 DIAGNOSIS — E1142 Type 2 diabetes mellitus with diabetic polyneuropathy: Secondary | ICD-10-CM | POA: Diagnosis not present

## 2020-01-31 DIAGNOSIS — I4891 Unspecified atrial fibrillation: Secondary | ICD-10-CM | POA: Diagnosis not present

## 2020-01-31 DIAGNOSIS — Z7901 Long term (current) use of anticoagulants: Secondary | ICD-10-CM | POA: Diagnosis not present

## 2020-01-31 DIAGNOSIS — E1121 Type 2 diabetes mellitus with diabetic nephropathy: Secondary | ICD-10-CM | POA: Diagnosis not present

## 2020-01-31 DIAGNOSIS — I10 Essential (primary) hypertension: Secondary | ICD-10-CM

## 2020-01-31 DIAGNOSIS — N1831 Chronic kidney disease, stage 3a: Secondary | ICD-10-CM

## 2020-01-31 DIAGNOSIS — I5032 Chronic diastolic (congestive) heart failure: Secondary | ICD-10-CM | POA: Diagnosis not present

## 2020-01-31 NOTE — Progress Notes (Signed)
Subjective:  Patient ID: Matthew Gutierrez, male    DOB: 1955/09/05  Age: 64 y.o. MRN: [id number]  Chief Complaint  Patient presents with  . Acute Renal Failure    HPI: transition of care and reconciliation of medicines. Hospital records and labs reviewed.  Patient admitted 01/24/20 to [facility].  For medication induced coagulopathy with INR 90, He was not taking his medicines correctly. He was admitted and rehydrated, coumadin stopped, kidney test went to creatinine 1.8.  His vitamin D is low.  He is still trying to ambulate. Discharged 01/27/2020.  To home.  He is having problems living alone.  He cannot remember taking his medicines correctly.  He needs assisted living and family is arranging for Medicaid to get him into assisted living. He is dyspneic on walking 10 feet.  He is suppose to be on oxygen. He fell once today and he is seeing physical therapy   Current Outpatient Medications on File Prior to Visit  Medication Sig Dispense Refill  . albuterol (PROVENTIL) (2.5 MG/3ML) 0.083% nebulizer solution Inhale 2.5 mg into the lungs every 2 (two) hours.    Marland Kitchen allopurinol (ZYLOPRIM) 300 MG tablet Take 1 tablet (300 mg total) by mouth daily. 90 tablet 1  . amiodarone (PACERONE) 100 MG tablet Take 2 tablets (200 mg total) by mouth daily. 360 tablet 2  . carvedilol (COREG) 3.125 MG tablet Take 3.125 mg by mouth 2 (two) times daily with a meal.    . diltiazem (CARDIZEM) 60 MG tablet Take 60 mg by mouth 2 (two) times daily.    Marland Kitchen EPINEPHrine 0.3 mg/0.3 mL IJ SOAJ injection Inject 0.3 mg into the muscle as needed for anaphylaxis.    . fesoterodine (TOVIAZ) 4 MG TB24 tablet Take 1 tablet (4 mg total) by mouth daily. 30 tablet 2  . fluticasone (FLONASE) 50 MCG/ACT nasal spray Place 1 spray into both nostrils daily. 18.2 mL 2  . furosemide (LASIX) 20 MG tablet Take 20 mg by mouth.    . gabapentin (NEURONTIN) 300 MG capsule Take 1 capsule (300 mg total) by mouth at bedtime. 90 capsule 2  .  hydrALAZINE (APRESOLINE) 100 MG tablet Take 1 tablet (100 mg total) by mouth 3 (three) times daily. 270 tablet 2  . isosorbide mononitrate (IMDUR) 120 MG 24 hr tablet Take 1 tablet (120 mg total) by mouth daily. 90 tablet 2  . metFORMIN (GLUCOPHAGE) 1000 MG tablet Take 1 tablet (1,000 mg total) by mouth 2 (two) times daily with a meal. 180 tablet 2  . ondansetron (ZOFRAN-ODT) 4 MG disintegrating tablet     . oxyCODONE-acetaminophen (PERCOCET) 10-325 MG tablet Take 1 tablet by mouth every 6 (six) hours as needed for pain.    . phenytoin (DILANTIN) 300 MG ER capsule Take 1 capsule (300 mg total) by mouth 2 (two) times daily. Patient currently on 100 mg 2 times a day 180 capsule 2  . polyethylene glycol powder (MIRALAX) 17 GM/SCOOP powder Take 1 Container by mouth once.    . pravastatin (PRAVACHOL) 40 MG tablet Take 1 tablet (40 mg total) by mouth daily. 90 tablet 2  . torsemide (DEMADEX) 20 MG tablet Take 3 tablets (60 mg total) by mouth 2 (two) times daily. 180 tablet 2  . triamcinolone cream (KENALOG) 0.1 % APPLY TO THE AFFECTED AREA(S) TWICE DAILY AS NEEDED 454 g 2  . warfarin (COUMADIN) 2 MG tablet Take 2 mg by mouth 3 (three) times a week. Monday, Wednesday and Friday    .  warfarin (COUMADIN) 4 MG tablet Take 4 mg by mouth daily. Saturday, Sunday,tuesday and thursday     No current facility-administered medications on file prior to visit.   Past Medical History:  Diagnosis Date  . Absence epileptic syndrome, not intractable, without status epilepticus ([place]) 07/23/2019  . Acute renal failure superimposed on stage 3 chronic kidney disease ([place]) 07/23/2019  . Atherosclerotic heart disease 07/23/2019  . Atrial fibrillation with RVR ([place]) 12/14/2019  . Benign enlargement of prostate 07/23/2019  . Body mass index 50.0-59.9, adult ([place]) 07/23/2019  . Cellulitis of leg 12/14/2019  . Chronic respiratory failure with hypoxia ([place]) 07/23/2019  . Chronic venous hypertension (idiopathic) with ulcer of left  lower extremity ([place]) 07/23/2019  . COPD (chronic obstructive pulmonary disease) ([place]) 07/23/2019  . Diabetic glomerulopathy ([place]) 10/15/2019  . Diabetic neuropathy ([place]) 07/23/2019  . 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[place]) 10/15/2019  . Epilepsy, grand mal ([place])   . Essential hypertension 01/22/2018  . Frozen shoulder    Right Shoulder  . Gout attack 10/06/2019  . H/O acute myocardial infarction 01/22/2018  . H/O: stroke 01/22/2018  . History of DVT (deep vein thrombosis) 01/22/2018  . Incontinence without sensory awareness 07/23/2019  . Long term (current) use of anticoagulants 07/23/2019  . Lumbago   . Lymphedema of both lower extremities 01/22/2018  . Mixed hyperlipidemia 07/23/2019  . Morbid obesity ([place]) 07/23/2019  . Obstructive sleep apnea 07/23/2019  . Paroxysmal atrial fibrillation ([place]) 01/07/2020  . Peripheral vascular disease ([place])   . Peripheral vascular disease, unspecified ([place]) 07/12/2011  . Secondary hypercoagulable state ([place]) 01/07/2020  . Shoulder bursitis 07/25/2019  . Stroke ([place])   . Urinary incontinence 07/25/2019   Past Surgical History:  Procedure Laterality Date  . CORONARY STENT PLACEMENT      Family History  Problem Relation Age of Onset  . Cancer Mother   . Stroke Father   . Hypertension Other   . Stroke Other   . Hyperlipidemia Other   . Alcohol abuse Other   . Heart disease Other   . Cancer Other    Social History   Socioeconomic History  . Marital status: Divorced    Spouse name: Not on file  . Number of children: 2  . Years of education: Not on file  . Highest education level: Not on file  Occupational History  . Occupation: Disability  Tobacco Use  . Smoking status: Former Smoker    Quit date: 10/28/2009    Years since quitting: 10.2  . Smokeless tobacco: Never Used  Substance and Sexual Activity  . Alcohol use: Not Currently    Comment: occassionally  . Drug use: No  . Sexual activity: Not Currently  Other Topics Concern  . Not on file  Social  History Narrative  . Not on file   Social Determinants of Health   Financial Resource Strain:   . Difficulty of Paying Living Expenses: Not on file  Food Insecurity:   . Worried About Charity fundraiser in the Last Year: Not on file  . Ran Out of Food in the Last Year: Not on file  Transportation Needs:   . Lack of Transportation (Medical): Not on file  . Lack of Transportation (Non-Medical): Not on file  Physical Activity: Inactive  . Days of Exercise per Week: 0 days  . Minutes of Exercise per Session: 0 min  Stress:   . Feeling of Stress : Not on file  Social Connections:   . Frequency of Communication with Friends and  Family: Not on file  . Frequency of Social Gatherings with Friends and Family: Not on file  . Attends Religious Services: Not on file  . Active Member of Clubs or Organizations: Not on file  . Attends Archivist Meetings: Not on file  . Marital Status: Not on file    Review of Systems  Constitutional: Negative.   Eyes: Negative.   Respiratory: Positive for cough and shortness of breath. Negative for chest tightness.   Cardiovascular: Positive for leg swelling. Negative for chest pain and palpitations.  Gastrointestinal: Negative.   Genitourinary: Negative.   Musculoskeletal: Positive for arthralgias.  Skin:       Chronic leg ulcers  Neurological: Negative.   Psychiatric/Behavioral: Positive for confusion.     Objective:  BP (!) 160/90   Pulse (!) 104   Temp 97.7 F (36.5 C)   Resp 16   SpO2 94%   BP/Weight 01/31/2020 01/17/2020 01/21/2352  Systolic BP 614 431 540  Diastolic BP 90 70 64  Wt. (Lbs) - 298 296  BMI - 40.42 40.14    Physical Exam Vitals reviewed.  Constitutional:      Appearance: Normal appearance. He is obese.     Comments: patient in wheel chair and dyspneic with minimum activity  HENT:     Head: Normocephalic.     Right Ear: Tympanic membrane, ear canal and external ear normal.     Left Ear: Tympanic membrane,  ear canal and external ear normal.     Mouth/Throat:     Mouth: Mucous membranes are moist.     Pharynx: Oropharynx is clear.  Eyes:     Extraocular Movements: Extraocular movements intact.     Conjunctiva/sclera: Conjunctivae normal.     Pupils: Pupils are equal, round, and reactive to light.  Cardiovascular:     Rate and Rhythm: Normal rate and regular rhythm.     Pulses: Normal pulses.     Heart sounds: Normal heart sounds.  Pulmonary:     Effort: Pulmonary effort is normal.     Breath sounds: Normal breath sounds.  Abdominal:     General: Abdomen is flat.     Palpations: Abdomen is soft.  Musculoskeletal:        General: Normal range of motion.     Cervical back: Normal range of motion and neck supple.  Skin:    General: Skin is warm.     Capillary Refill: Capillary refill takes less than 2 seconds.  Neurological:     General: No focal deficit present.     Mental Status: He is alert and oriented to person, place, and time.  Psychiatric:        Mood and Affect: Mood normal.        Behavior: Behavior normal.        Thought Content: Thought content normal.        Judgment: Judgment normal.     Diabetic Foot Exam - Simple   Simple Foot Form Diabetic Foot exam was performed with the following findings: Yes 01/31/2020 11:06 AM  Visual Inspection See comments: Yes Sensation Testing See comments: Yes Pulse Check See comments: Yes Comments No sensation with monofilament, he has ulcers both legs and poor capillary filling toes      Lab Results  Component Value Date   WBC 12.4 (H) 01/31/2020   HGB 9.4 (L) 01/31/2020   HCT 29.1 (L) 01/31/2020   PLT 272 01/31/2020   GLUCOSE 167 (H) 01/31/2020   CHOL 101  01/16/2020   TRIG 101 01/16/2020   HDL 45 01/16/2020   LDLCALC 37 01/16/2020   ALT 42 01/31/2020   AST 31 01/31/2020   NA 136 01/31/2020   K 3.9 01/31/2020   CL 90 (L) 01/31/2020   CREATININE 1.74 (H) 01/31/2020   BUN 34 (H) 01/31/2020   CO2 27 01/31/2020   INR  2.6 (H) 01/31/2020   HGBA1C 8.4 (H) 01/31/2020   MICROALBUR 150 10/15/2019      Assessment & Plan:   1. Essential hypertension - CBC with Differential/Platelet - Comprehensive metabolic panel An individual hypertension care plan was established and reinforced today.  The patient's status was assessed using clinical findings on exam and labs or diagnostic tests. The patient's success at meeting treatment goals on disease specific evidence-based guidelines and found to be well controlled. He is having no hypotension SELF MANAGEMENT: The patient and I together assessed ways to personally work towards obtaining the recommended goals. RECOMMENDATIONS: avoid decongestants found in common cold remedies, decrease consumption of alcohol, perform routine monitoring of BP with home BP cuff, exercise, reduction of dietary salt, take medicines as prescribed, try not to miss doses and quit smoking.  Regular exercise and maintaining a healthy weight is needed.  Stress reduction may help. A CLINICAL SUMMARY including written plan identify barriers to care unique to individual due to social or financial issues.  We attempt to mutually creat solutions for individual and family understanding.  2. Paroxysmal atrial fibrillation (HCC) AN INDIVIDUAL CARE PLAN for atrial fibrillation was established and reinforced today.  The patient's status was assessed using clinical findings on exam, labs, and other diagnostic testing. Patient's success at meeting treatment goals based on disease specific evidence-bassed guidelines and found to be in fair control. RECOMMENDATIONS include maintaining present medicines and treatment. Need to check protime that was high in hospital  3. Chronic respiratory failure with hypoxia (HCC) Patient is on chronic O2 at 2L/min  4. Chronic obstructive pulmonary disease, unspecified COPD type ([place]) An individualize plan was formulated for care of COPD.  Treatment is evidence based.  She will  continue on inhalers, avoid smoking and smoke.  Regular exercise with help with dyspnea. Routine follow ups and medication compliance is needed.  We reviewed all his medicines 5. Diabetic glomerulopathy (HCC) - Hemoglobin A1c An individual care plan for diabetes was established and reinforced today.  The patient's status was assessed using clinical findings on exam, labs and diagnostic testing. Patient success at meeting goals based on disease specific evidence-based guidelines and found to be fair controlled. He is unable to tell me all the medicines he is taking, I will have home health check and get sarah brown to review with him. Medications were assessed and patient's understanding of the medical issues , including barriers were assessed. Recommend adherence to a diabetic diet, a graduated exercise program, HgbA1c level is checked quarterly, and urine microalbumin performed yearly .  Annual mono-filament sensation testing performed. Lower blood pressure and control hyperlipidemia is important. Get annual eye exams and annual flu shots and smoking cessation discussed.  Self management goals were discussed.  6. Other diabetic neurological complication associated with type 2 diabetes mellitus ([place]) An individual care plan for diabetes was established and reinforced today.  The patient's status was assessed using clinical findings on exam, labs and diagnostic testing. Patient success at meeting goals based on disease specific evidence-based guidelines and found to be poor controlled. Medications were assessed and patient's understanding of the medical issues , including  barriers were assessed. Recommend adherence to a diabetic diet, a graduated exercise program, HgbA1c level is checked quarterly, and urine microalbumin performed yearly .  Annual mono-filament sensation testing performed. Lower blood pressure and control hyperlipidemia is important. Get annual eye exams and annual flu shots and smoking  cessation discussed.  Self management goals were discussed.  7. 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[place]) He has CAD and PVD  8. Acute renal failure superimposed on stage 3a chronic kidney disease, unspecified acute renal failure type ([place]) We will recheck his renal functions after rehydration/  9. Epilepsy, grand mal ([place]) He has not had a seizure recently by remains on phenytoin  10. Long term (current) use of anticoagulants - Protime-INR Patient is on chronic coumadin therapy with problems controlling PT  11. Incontinence without sensory awareness This continues and he is using diapers for now.  We discussed assisted living which he sister thinks he needs, his family is trying to get him medicaid      Orders Placed This Encounter  Procedures  . CBC with Differential/Platelet  . Comprehensive metabolic panel  . Hemoglobin A1c  . Protime-INR     Follow-up: Return in about 1 year (around 01/30/2021).  An After Visit Summary was printed and given to the patient.  [place] [phone]

## 2020-02-01 LAB — COMPREHENSIVE METABOLIC PANEL
ALT: 42 IU/L (ref 0–44)
AST: 31 IU/L (ref 0–40)
Albumin/Globulin Ratio: 0.9 — ABNORMAL LOW (ref 1.2–2.2)
Albumin: 3.3 g/dL — ABNORMAL LOW (ref 3.8–4.8)
Alkaline Phosphatase: 191 IU/L — ABNORMAL HIGH (ref 48–121)
BUN/Creatinine Ratio: 20 (ref 10–24)
BUN: 34 mg/dL — ABNORMAL HIGH (ref 8–27)
Bilirubin Total: <0.2 mg/dL (ref 0.0–1.2)
CO2: 27 mmol/L (ref 20–29)
Calcium: 7.7 mg/dL — ABNORMAL LOW (ref 8.6–10.2)
Chloride: 90 mmol/L — ABNORMAL LOW (ref 96–106)
Creatinine, Ser: 1.74 mg/dL — ABNORMAL HIGH (ref 0.76–1.27)
GFR calc Af Amer: 47 mL/min/{1.73_m2} — ABNORMAL LOW (ref >59)
GFR calc non Af Amer: 40 mL/min/{1.73_m2} — ABNORMAL LOW (ref >59)
Globulin, Total: 3.7 g/dL (ref 1.5–4.5)
Glucose: 167 mg/dL — ABNORMAL HIGH (ref 65–99)
Potassium: 3.9 mmol/L (ref 3.5–5.2)
Sodium: 136 mmol/L (ref 134–144)
Total Protein: 7 g/dL (ref 6.0–8.5)

## 2020-02-01 LAB — CBC WITH DIFFERENTIAL/PLATELET
Basophils Absolute: 0.1 10*3/uL (ref 0.0–0.2)
Basos: 1 %
EOS (ABSOLUTE): 0.1 10*3/uL (ref 0.0–0.4)
Eos: 1 %
Hematocrit: 29.1 % — ABNORMAL LOW (ref 37.5–51.0)
Hemoglobin: 9.4 g/dL — ABNORMAL LOW (ref 13.0–17.7)
Immature Grans (Abs): 0.1 10*3/uL (ref 0.0–0.1)
Immature Granulocytes: 1 %
Lymphocytes Absolute: 1.4 10*3/uL (ref 0.7–3.1)
Lymphs: 11 %
MCH: 27.8 pg (ref 26.6–33.0)
MCHC: 32.3 g/dL (ref 31.5–35.7)
MCV: 86 fL (ref 79–97)
Monocytes Absolute: 0.8 10*3/uL (ref 0.1–0.9)
Monocytes: 7 %
Neutrophils Absolute: 9.9 10*3/uL — ABNORMAL HIGH (ref 1.4–7.0)
Neutrophils: 79 %
Platelets: 272 10*3/uL (ref 150–450)
RBC: 3.38 x10E6/uL — ABNORMAL LOW (ref 4.14–5.80)
RDW: 13.7 % (ref 11.6–15.4)
WBC: 12.4 10*3/uL — ABNORMAL HIGH (ref 3.4–10.8)

## 2020-02-01 LAB — PROTIME-INR
INR: 2.6 — ABNORMAL HIGH (ref 0.9–1.2)
Prothrombin Time: 26.1 s — ABNORMAL HIGH (ref 9.1–12.0)

## 2020-02-01 LAB — HEMOGLOBIN A1C
Est. average glucose Bld gHb Est-mCnc: 194 mg/dL
Hgb A1c MFr Bld: 8.4 % — ABNORMAL HIGH (ref 4.8–5.6)

## 2020-02-01 NOTE — Progress Notes (Signed)
Worsening anemia, WBC still high, glucose 167, kidney tests improved, A1c still high 8.4, INR 2.6 ok lp

## 2020-02-04 ENCOUNTER — Telehealth: Payer: Self-pay

## 2020-02-04 DIAGNOSIS — I5032 Chronic diastolic (congestive) heart failure: Secondary | ICD-10-CM | POA: Diagnosis not present

## 2020-02-04 DIAGNOSIS — W19XXXA Unspecified fall, initial encounter: Secondary | ICD-10-CM | POA: Diagnosis not present

## 2020-02-04 DIAGNOSIS — S8392XA Sprain of unspecified site of left knee, initial encounter: Secondary | ICD-10-CM | POA: Diagnosis not present

## 2020-02-04 DIAGNOSIS — S99912A Unspecified injury of left ankle, initial encounter: Secondary | ICD-10-CM | POA: Diagnosis not present

## 2020-02-04 DIAGNOSIS — E1142 Type 2 diabetes mellitus with diabetic polyneuropathy: Secondary | ICD-10-CM | POA: Diagnosis not present

## 2020-02-04 DIAGNOSIS — R52 Pain, unspecified: Secondary | ICD-10-CM | POA: Diagnosis not present

## 2020-02-04 DIAGNOSIS — M7989 Other specified soft tissue disorders: Secondary | ICD-10-CM | POA: Diagnosis not present

## 2020-02-04 DIAGNOSIS — N1832 Chronic kidney disease, stage 3b: Secondary | ICD-10-CM | POA: Diagnosis not present

## 2020-02-04 DIAGNOSIS — R0902 Hypoxemia: Secondary | ICD-10-CM | POA: Diagnosis not present

## 2020-02-04 DIAGNOSIS — I4891 Unspecified atrial fibrillation: Secondary | ICD-10-CM | POA: Diagnosis not present

## 2020-02-04 DIAGNOSIS — E1122 Type 2 diabetes mellitus with diabetic chronic kidney disease: Secondary | ICD-10-CM | POA: Diagnosis not present

## 2020-02-04 DIAGNOSIS — M79605 Pain in left leg: Secondary | ICD-10-CM | POA: Diagnosis not present

## 2020-02-04 DIAGNOSIS — I130 Hypertensive heart and chronic kidney disease with heart failure and stage 1 through stage 4 chronic kidney disease, or unspecified chronic kidney disease: Secondary | ICD-10-CM | POA: Diagnosis not present

## 2020-02-04 DIAGNOSIS — R29898 Other symptoms and signs involving the musculoskeletal system: Secondary | ICD-10-CM | POA: Diagnosis not present

## 2020-02-04 DIAGNOSIS — S838X2A Sprain of other specified parts of left knee, initial encounter: Secondary | ICD-10-CM | POA: Diagnosis not present

## 2020-02-04 DIAGNOSIS — M25462 Effusion, left knee: Secondary | ICD-10-CM | POA: Diagnosis not present

## 2020-02-04 DIAGNOSIS — M79662 Pain in left lower leg: Secondary | ICD-10-CM | POA: Diagnosis not present

## 2020-02-04 DIAGNOSIS — S8992XA Unspecified injury of left lower leg, initial encounter: Secondary | ICD-10-CM | POA: Diagnosis not present

## 2020-02-04 DIAGNOSIS — J449 Chronic obstructive pulmonary disease, unspecified: Secondary | ICD-10-CM | POA: Diagnosis not present

## 2020-02-05 ENCOUNTER — Telehealth: Payer: Self-pay

## 2020-02-05 ENCOUNTER — Other Ambulatory Visit: Payer: Self-pay

## 2020-02-05 DIAGNOSIS — I4891 Unspecified atrial fibrillation: Secondary | ICD-10-CM | POA: Diagnosis not present

## 2020-02-05 DIAGNOSIS — N3942 Incontinence without sensory awareness: Secondary | ICD-10-CM

## 2020-02-05 DIAGNOSIS — N1832 Chronic kidney disease, stage 3b: Secondary | ICD-10-CM | POA: Diagnosis not present

## 2020-02-05 DIAGNOSIS — I130 Hypertensive heart and chronic kidney disease with heart failure and stage 1 through stage 4 chronic kidney disease, or unspecified chronic kidney disease: Secondary | ICD-10-CM | POA: Diagnosis not present

## 2020-02-05 DIAGNOSIS — J449 Chronic obstructive pulmonary disease, unspecified: Secondary | ICD-10-CM | POA: Diagnosis not present

## 2020-02-05 DIAGNOSIS — I10 Essential (primary) hypertension: Secondary | ICD-10-CM

## 2020-02-05 DIAGNOSIS — I5032 Chronic diastolic (congestive) heart failure: Secondary | ICD-10-CM | POA: Diagnosis not present

## 2020-02-05 DIAGNOSIS — E1122 Type 2 diabetes mellitus with diabetic chronic kidney disease: Secondary | ICD-10-CM | POA: Diagnosis not present

## 2020-02-05 DIAGNOSIS — E1142 Type 2 diabetes mellitus with diabetic polyneuropathy: Secondary | ICD-10-CM | POA: Diagnosis not present

## 2020-02-05 MED ORDER — FESOTERODINE FUMARATE ER 4 MG PO TB24: 4.0000 mg | ORAL_TABLET | Freq: Every day | ORAL | 6 refills | Status: DC

## 2020-02-05 MED ORDER — CARVEDILOL 3.125 MG PO TABS: 3.1250 mg | ORAL_TABLET | Freq: Two times a day (BID) | ORAL | 6 refills | Status: DC

## 2020-02-05 MED ORDER — DILTIAZEM HCL 60 MG PO TABS: 60.0000 mg | ORAL_TABLET | Freq: Two times a day (BID) | ORAL | 6 refills | Status: DC

## 2020-02-05 NOTE — Telephone Encounter (Signed)
Home health nurse Hilda Blades called and asked for verbal orders for a referral to a social worker, Patient has called [phone] times this week to help transfer him from bed to bathroom. Dr. Henrene Pastor gave verbal order

## 2020-02-06 DIAGNOSIS — I5032 Chronic diastolic (congestive) heart failure: Secondary | ICD-10-CM | POA: Diagnosis not present

## 2020-02-06 DIAGNOSIS — E1122 Type 2 diabetes mellitus with diabetic chronic kidney disease: Secondary | ICD-10-CM | POA: Diagnosis not present

## 2020-02-06 DIAGNOSIS — I4891 Unspecified atrial fibrillation: Secondary | ICD-10-CM | POA: Diagnosis not present

## 2020-02-06 DIAGNOSIS — I130 Hypertensive heart and chronic kidney disease with heart failure and stage 1 through stage 4 chronic kidney disease, or unspecified chronic kidney disease: Secondary | ICD-10-CM | POA: Diagnosis not present

## 2020-02-06 DIAGNOSIS — J449 Chronic obstructive pulmonary disease, unspecified: Secondary | ICD-10-CM | POA: Diagnosis not present

## 2020-02-06 DIAGNOSIS — E1142 Type 2 diabetes mellitus with diabetic polyneuropathy: Secondary | ICD-10-CM | POA: Diagnosis not present

## 2020-02-06 DIAGNOSIS — N1832 Chronic kidney disease, stage 3b: Secondary | ICD-10-CM | POA: Diagnosis not present

## 2020-02-07 DIAGNOSIS — Z7401 Bed confinement status: Secondary | ICD-10-CM | POA: Diagnosis not present

## 2020-02-07 DIAGNOSIS — N178 Other acute kidney failure: Secondary | ICD-10-CM | POA: Diagnosis not present

## 2020-02-07 DIAGNOSIS — J9601 Acute respiratory failure with hypoxia: Secondary | ICD-10-CM | POA: Diagnosis not present

## 2020-02-07 DIAGNOSIS — I480 Paroxysmal atrial fibrillation: Secondary | ICD-10-CM | POA: Diagnosis not present

## 2020-02-07 DIAGNOSIS — E1142 Type 2 diabetes mellitus with diabetic polyneuropathy: Secondary | ICD-10-CM | POA: Diagnosis not present

## 2020-02-07 DIAGNOSIS — R531 Weakness: Secondary | ICD-10-CM | POA: Diagnosis not present

## 2020-02-07 DIAGNOSIS — R0902 Hypoxemia: Secondary | ICD-10-CM | POA: Diagnosis not present

## 2020-02-07 DIAGNOSIS — G40A09 Absence epileptic syndrome, not intractable, without status epilepticus: Secondary | ICD-10-CM | POA: Diagnosis not present

## 2020-02-07 DIAGNOSIS — E1169 Type 2 diabetes mellitus with other specified complication: Secondary | ICD-10-CM | POA: Diagnosis not present

## 2020-02-07 DIAGNOSIS — N1831 Chronic kidney disease, stage 3a: Secondary | ICD-10-CM | POA: Diagnosis not present

## 2020-02-07 DIAGNOSIS — J9611 Chronic respiratory failure with hypoxia: Secondary | ICD-10-CM | POA: Diagnosis not present

## 2020-02-07 DIAGNOSIS — I82501 Chronic embolism and thrombosis of unspecified deep veins of right lower extremity: Secondary | ICD-10-CM | POA: Diagnosis not present

## 2020-02-07 DIAGNOSIS — I517 Cardiomegaly: Secondary | ICD-10-CM | POA: Diagnosis not present

## 2020-02-07 DIAGNOSIS — N1832 Chronic kidney disease, stage 3b: Secondary | ICD-10-CM | POA: Diagnosis not present

## 2020-02-07 DIAGNOSIS — E1122 Type 2 diabetes mellitus with diabetic chronic kidney disease: Secondary | ICD-10-CM | POA: Diagnosis not present

## 2020-02-07 DIAGNOSIS — L03116 Cellulitis of left lower limb: Secondary | ICD-10-CM | POA: Diagnosis not present

## 2020-02-07 DIAGNOSIS — R0602 Shortness of breath: Secondary | ICD-10-CM | POA: Diagnosis not present

## 2020-02-07 DIAGNOSIS — I4891 Unspecified atrial fibrillation: Secondary | ICD-10-CM | POA: Diagnosis not present

## 2020-02-07 DIAGNOSIS — I5032 Chronic diastolic (congestive) heart failure: Secondary | ICD-10-CM | POA: Diagnosis not present

## 2020-02-07 DIAGNOSIS — J449 Chronic obstructive pulmonary disease, unspecified: Secondary | ICD-10-CM | POA: Diagnosis not present

## 2020-02-07 DIAGNOSIS — I130 Hypertensive heart and chronic kidney disease with heart failure and stage 1 through stage 4 chronic kidney disease, or unspecified chronic kidney disease: Secondary | ICD-10-CM | POA: Diagnosis not present

## 2020-02-07 DIAGNOSIS — M25512 Pain in left shoulder: Secondary | ICD-10-CM | POA: Diagnosis not present

## 2020-02-07 DIAGNOSIS — R262 Difficulty in walking, not elsewhere classified: Secondary | ICD-10-CM | POA: Diagnosis not present

## 2020-02-07 DIAGNOSIS — A419 Sepsis, unspecified organism: Secondary | ICD-10-CM | POA: Diagnosis not present

## 2020-02-07 DIAGNOSIS — I890 Lymphedema, not elsewhere classified: Secondary | ICD-10-CM | POA: Diagnosis not present

## 2020-02-07 DIAGNOSIS — I10 Essential (primary) hypertension: Secondary | ICD-10-CM | POA: Diagnosis not present

## 2020-02-07 DIAGNOSIS — D688 Other specified coagulation defects: Secondary | ICD-10-CM | POA: Diagnosis not present

## 2020-02-07 DIAGNOSIS — M2550 Pain in unspecified joint: Secondary | ICD-10-CM | POA: Diagnosis not present

## 2020-02-07 DIAGNOSIS — R609 Edema, unspecified: Secondary | ICD-10-CM | POA: Diagnosis not present

## 2020-02-08 DIAGNOSIS — D688 Other specified coagulation defects: Secondary | ICD-10-CM | POA: Diagnosis not present

## 2020-02-08 DIAGNOSIS — N178 Other acute kidney failure: Secondary | ICD-10-CM | POA: Diagnosis not present

## 2020-02-08 DIAGNOSIS — A419 Sepsis, unspecified organism: Secondary | ICD-10-CM | POA: Diagnosis not present

## 2020-02-09 DIAGNOSIS — A419 Sepsis, unspecified organism: Secondary | ICD-10-CM | POA: Diagnosis not present

## 2020-02-09 DIAGNOSIS — N178 Other acute kidney failure: Secondary | ICD-10-CM | POA: Diagnosis not present

## 2020-02-09 DIAGNOSIS — D688 Other specified coagulation defects: Secondary | ICD-10-CM | POA: Diagnosis not present

## 2020-02-10 DIAGNOSIS — A419 Sepsis, unspecified organism: Secondary | ICD-10-CM | POA: Diagnosis not present

## 2020-02-10 DIAGNOSIS — D688 Other specified coagulation defects: Secondary | ICD-10-CM | POA: Diagnosis not present

## 2020-02-10 DIAGNOSIS — N178 Other acute kidney failure: Secondary | ICD-10-CM | POA: Diagnosis not present

## 2020-02-11 DIAGNOSIS — A419 Sepsis, unspecified organism: Secondary | ICD-10-CM | POA: Diagnosis not present

## 2020-02-11 DIAGNOSIS — N178 Other acute kidney failure: Secondary | ICD-10-CM | POA: Diagnosis not present

## 2020-02-11 DIAGNOSIS — D688 Other specified coagulation defects: Secondary | ICD-10-CM | POA: Diagnosis not present

## 2020-02-12 DIAGNOSIS — J449 Chronic obstructive pulmonary disease, unspecified: Secondary | ICD-10-CM | POA: Diagnosis not present

## 2020-02-12 DIAGNOSIS — Z23 Encounter for immunization: Secondary | ICD-10-CM | POA: Diagnosis not present

## 2020-02-12 DIAGNOSIS — N1831 Chronic kidney disease, stage 3a: Secondary | ICD-10-CM | POA: Diagnosis not present

## 2020-02-12 DIAGNOSIS — J9601 Acute respiratory failure with hypoxia: Secondary | ICD-10-CM | POA: Diagnosis not present

## 2020-02-12 DIAGNOSIS — N178 Other acute kidney failure: Secondary | ICD-10-CM | POA: Diagnosis not present

## 2020-02-12 DIAGNOSIS — I5032 Chronic diastolic (congestive) heart failure: Secondary | ICD-10-CM | POA: Diagnosis not present

## 2020-02-12 DIAGNOSIS — I480 Paroxysmal atrial fibrillation: Secondary | ICD-10-CM | POA: Diagnosis not present

## 2020-02-12 DIAGNOSIS — I890 Lymphedema, not elsewhere classified: Secondary | ICD-10-CM | POA: Diagnosis not present

## 2020-02-12 DIAGNOSIS — E1149 Type 2 diabetes mellitus with other diabetic neurological complication: Secondary | ICD-10-CM | POA: Diagnosis not present

## 2020-02-12 DIAGNOSIS — R0602 Shortness of breath: Secondary | ICD-10-CM | POA: Diagnosis not present

## 2020-02-12 DIAGNOSIS — J9611 Chronic respiratory failure with hypoxia: Secondary | ICD-10-CM | POA: Diagnosis not present

## 2020-02-12 DIAGNOSIS — G40A09 Absence epileptic syndrome, not intractable, without status epilepticus: Secondary | ICD-10-CM | POA: Diagnosis not present

## 2020-02-12 DIAGNOSIS — M2550 Pain in unspecified joint: Secondary | ICD-10-CM | POA: Diagnosis not present

## 2020-02-12 DIAGNOSIS — I87312 Chronic venous hypertension (idiopathic) with ulcer of left lower extremity: Secondary | ICD-10-CM | POA: Diagnosis not present

## 2020-02-12 DIAGNOSIS — I10 Essential (primary) hypertension: Secondary | ICD-10-CM | POA: Diagnosis not present

## 2020-02-12 DIAGNOSIS — E782 Mixed hyperlipidemia: Secondary | ICD-10-CM | POA: Diagnosis not present

## 2020-02-12 DIAGNOSIS — D6869 Other thrombophilia: Secondary | ICD-10-CM | POA: Diagnosis not present

## 2020-02-12 DIAGNOSIS — G40409 Other generalized epilepsy and epileptic syndromes, not intractable, without status epilepticus: Secondary | ICD-10-CM | POA: Diagnosis not present

## 2020-02-12 DIAGNOSIS — I5020 Unspecified systolic (congestive) heart failure: Secondary | ICD-10-CM | POA: Diagnosis not present

## 2020-02-12 DIAGNOSIS — E1169 Type 2 diabetes mellitus with other specified complication: Secondary | ICD-10-CM | POA: Diagnosis not present

## 2020-02-12 DIAGNOSIS — Z7901 Long term (current) use of anticoagulants: Secondary | ICD-10-CM | POA: Diagnosis not present

## 2020-02-12 DIAGNOSIS — A419 Sepsis, unspecified organism: Secondary | ICD-10-CM | POA: Diagnosis not present

## 2020-02-12 DIAGNOSIS — E1121 Type 2 diabetes mellitus with diabetic nephropathy: Secondary | ICD-10-CM | POA: Diagnosis not present

## 2020-02-12 DIAGNOSIS — D688 Other specified coagulation defects: Secondary | ICD-10-CM | POA: Diagnosis not present

## 2020-02-12 DIAGNOSIS — Z7401 Bed confinement status: Secondary | ICD-10-CM | POA: Diagnosis not present

## 2020-02-12 DIAGNOSIS — R609 Edema, unspecified: Secondary | ICD-10-CM | POA: Diagnosis not present

## 2020-02-12 DIAGNOSIS — I739 Peripheral vascular disease, unspecified: Secondary | ICD-10-CM | POA: Diagnosis not present

## 2020-02-12 DIAGNOSIS — R21 Rash and other nonspecific skin eruption: Secondary | ICD-10-CM | POA: Diagnosis not present

## 2020-02-12 DIAGNOSIS — E119 Type 2 diabetes mellitus without complications: Secondary | ICD-10-CM | POA: Diagnosis not present

## 2020-02-19 ENCOUNTER — Ambulatory Visit: Payer: Medicare HMO | Admitting: Legal Medicine

## 2020-03-03 DIAGNOSIS — Z7901 Long term (current) use of anticoagulants: Secondary | ICD-10-CM | POA: Diagnosis not present

## 2020-03-09 DIAGNOSIS — R21 Rash and other nonspecific skin eruption: Secondary | ICD-10-CM | POA: Diagnosis not present

## 2020-03-12 ENCOUNTER — Other Ambulatory Visit: Payer: Self-pay

## 2020-03-12 ENCOUNTER — Ambulatory Visit (INDEPENDENT_AMBULATORY_CARE_PROVIDER_SITE_OTHER): Payer: Medicare HMO | Admitting: Legal Medicine

## 2020-03-12 ENCOUNTER — Telehealth: Payer: Self-pay

## 2020-03-12 ENCOUNTER — Encounter: Payer: Self-pay | Admitting: Legal Medicine

## 2020-03-12 VITALS — BP 130/78 | HR 85 | Temp 98.1°F | Resp 17 | Ht 72.0 in | Wt 288.0 lb

## 2020-03-12 DIAGNOSIS — I890 Lymphedema, not elsewhere classified: Secondary | ICD-10-CM | POA: Diagnosis not present

## 2020-03-12 DIAGNOSIS — I10 Essential (primary) hypertension: Secondary | ICD-10-CM

## 2020-03-12 DIAGNOSIS — Z23 Encounter for immunization: Secondary | ICD-10-CM | POA: Diagnosis not present

## 2020-03-12 DIAGNOSIS — D6869 Other thrombophilia: Secondary | ICD-10-CM | POA: Diagnosis not present

## 2020-03-12 DIAGNOSIS — I87312 Chronic venous hypertension (idiopathic) with ulcer of left lower extremity: Secondary | ICD-10-CM

## 2020-03-12 DIAGNOSIS — R262 Difficulty in walking, not elsewhere classified: Secondary | ICD-10-CM

## 2020-03-12 DIAGNOSIS — G40409 Other generalized epilepsy and epileptic syndromes, not intractable, without status epilepticus: Secondary | ICD-10-CM | POA: Diagnosis not present

## 2020-03-12 DIAGNOSIS — F259 Schizoaffective disorder, unspecified: Secondary | ICD-10-CM

## 2020-03-12 DIAGNOSIS — I739 Peripheral vascular disease, unspecified: Secondary | ICD-10-CM

## 2020-03-12 DIAGNOSIS — E1159 Type 2 diabetes mellitus with other circulatory complications: Secondary | ICD-10-CM

## 2020-03-12 DIAGNOSIS — E1121 Type 2 diabetes mellitus with diabetic nephropathy: Secondary | ICD-10-CM | POA: Diagnosis not present

## 2020-03-12 DIAGNOSIS — E1149 Type 2 diabetes mellitus with other diabetic neurological complication: Secondary | ICD-10-CM

## 2020-03-12 DIAGNOSIS — E782 Mixed hyperlipidemia: Secondary | ICD-10-CM

## 2020-03-12 DIAGNOSIS — L97929 Non-pressure chronic ulcer of unspecified part of left lower leg with unspecified severity: Secondary | ICD-10-CM

## 2020-03-12 NOTE — Progress Notes (Signed)
Subjective:  Patient ID: Matthew Gutierrez, male    DOB: [birth year]  Age: 64 y.o. MRN: [id number]  Chief Complaint  Patient presents with  . Rehabilitation discharge    HPI:  Transition of care and reconciliation of medicines.Records reviewed from hospital but no records from nursing home.  He is accompanied by son.  He was admitted on 02/07/2020 for weakness and falls with over anticoagulation.  He was treated and transferred to [facility], He was discharged 03/12/2020.  They were working on walking.  He can ambulate short distances.  He is dependent on all ADLS, and IADLS.  He has large ulcers on legs from chronic lyphedema.   Current Outpatient Medications on File Prior to Visit  Medication Sig Dispense Refill  . albuterol (PROVENTIL) (2.5 MG/3ML) 0.083% nebulizer solution Inhale 2.5 mg into the lungs every 2 (two) hours.    Marland Kitchen allopurinol (ZYLOPRIM) 300 MG tablet Take 1 tablet (300 mg total) by mouth daily. 90 tablet 1  . carvedilol (COREG) 3.125 MG tablet Take 1 tablet (3.125 mg total) by mouth 2 (two) times daily with a meal. 60 tablet 6  . diltiazem (CARDIZEM) 60 MG tablet Take 1 tablet (60 mg total) by mouth 2 (two) times daily. 60 tablet 6  . EPINEPHrine 0.3 mg/0.3 mL IJ SOAJ injection Inject 0.3 mg into the muscle as needed for anaphylaxis.    . fesoterodine (TOVIAZ) 4 MG TB24 tablet Take 1 tablet (4 mg total) by mouth daily. 30 tablet 6  . fluticasone (FLONASE) 50 MCG/ACT nasal spray Place 1 spray into both nostrils daily. 18.2 mL 2  . gabapentin (NEURONTIN) 300 MG capsule Take 1 capsule (300 mg total) by mouth at bedtime. 90 capsule 2  . isosorbide mononitrate (IMDUR) 120 MG 24 hr tablet Take 1 tablet (120 mg total) by mouth daily. 90 tablet 2  . lisinopril (ZESTRIL) 10 MG tablet     . ondansetron (ZOFRAN-ODT) 4 MG disintegrating tablet     . phenytoin (DILANTIN) 300 MG ER capsule Take 1 capsule (300 mg total) by mouth 2 (two) times daily. Patient currently on 100 mg 2 times a  day 180 capsule 2  . polyethylene glycol powder (MIRALAX) 17 GM/SCOOP powder Take 1 Container by mouth once.    . pravastatin (PRAVACHOL) 40 MG tablet Take 1 tablet (40 mg total) by mouth daily. 90 tablet 2  . triamcinolone cream (KENALOG) 0.1 % APPLY TO THE AFFECTED AREA(S) TWICE DAILY AS NEEDED 454 g 2  . warfarin (COUMADIN) 2 MG tablet Take 2 mg by mouth 3 (three) times a week. Monday, Wednesday and Friday    . warfarin (COUMADIN) 4 MG tablet Take 4 mg by mouth daily. Saturday, Sunday,tuesday and thursday    . torsemide (DEMADEX) 20 MG tablet Take 3 tablets (60 mg total) by mouth 2 (two) times daily. 180 tablet 2   No current facility-administered medications on file prior to visit.   Past Medical History:  Diagnosis Date  . Absence epileptic syndrome, not intractable, without status epilepticus ([place]) 07/23/2019  . Acute renal failure superimposed on stage 3 chronic kidney disease ([place]) 07/23/2019  . Atherosclerotic heart disease 07/23/2019  . Atrial fibrillation with RVR ([place]) 12/14/2019  . Benign enlargement of prostate 07/23/2019  . Body mass index 50.0-59.9, adult ([place]) 07/23/2019  . Cellulitis of leg 12/14/2019  . Chronic respiratory failure with hypoxia ([place]) 07/23/2019  . Chronic venous hypertension (idiopathic) with ulcer of left lower extremity ([place]) 07/23/2019  . COPD (chronic obstructive pulmonary  disease) ([place]) 07/23/2019  . Diabetic glomerulopathy ([place]) 10/15/2019  . Diabetic neuropathy ([place]) 07/23/2019  . 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[place]) 10/15/2019  . Epilepsy, grand mal ([place])   . Essential hypertension 01/22/2018  . Frozen shoulder    Right Shoulder  . Gout attack 10/06/2019  . H/O acute myocardial infarction 01/22/2018  . H/O: stroke 01/22/2018  . History of DVT (deep vein thrombosis) 01/22/2018  . Incontinence without sensory awareness 07/23/2019  . Long term (current) use of anticoagulants 07/23/2019  . Lumbago   . Lymphedema of both lower extremities 01/22/2018  . Mixed hyperlipidemia  07/23/2019  . Morbid obesity ([place]) 07/23/2019  . Obstructive sleep apnea 07/23/2019  . Paroxysmal atrial fibrillation ([place]) 01/07/2020  . Peripheral vascular disease ([place])   . Peripheral vascular disease, unspecified ([place]) 07/12/2011  . Secondary hypercoagulable state ([place]) 01/07/2020  . Shoulder bursitis 07/25/2019  . Stroke ([place])   . Urinary incontinence 07/25/2019   Past Surgical History:  Procedure Laterality Date  . CORONARY STENT PLACEMENT      Family History  Problem Relation Age of Onset  . Cancer Mother   . Stroke Father   . Hypertension Other   . Stroke Other   . Hyperlipidemia Other   . Alcohol abuse Other   . Heart disease Other   . Cancer Other    Social History   Socioeconomic History  . Marital status: Divorced    Spouse name: Not on file  . Number of children: 2  . Years of education: Not on file  . Highest education level: Not on file  Occupational History  . Occupation: Disability  Tobacco Use  . Smoking status: Former Smoker    Quit date: 10/28/2009    Years since quitting: 10.3  . Smokeless tobacco: Never Used  Substance and Sexual Activity  . Alcohol use: Not Currently    Comment: occassionally  . Drug use: No  . Sexual activity: Not Currently  Other Topics Concern  . Not on file  Social History Narrative  . Not on file   Social Determinants of Health   Financial Resource Strain:   . Difficulty of Paying Living Expenses: Not on file  Food Insecurity:   . Worried About Charity fundraiser in the Last Year: Not on file  . Ran Out of Food in the Last Year: Not on file  Transportation Needs:   . Lack of Transportation (Medical): Not on file  . Lack of Transportation (Non-Medical): Not on file  Physical Activity: Inactive  . Days of Exercise per Week: 0 days  . Minutes of Exercise per Session: 0 min  Stress:   . Feeling of Stress : Not on file  Social Connections:   . Frequency of Communication with Friends and Family: Not on file  . Frequency  of Social Gatherings with Friends and Family: Not on file  . Attends Religious Services: Not on file  . Active Member of Clubs or Organizations: Not on file  . Attends Archivist Meetings: Not on file  . Marital Status: Not on file    Review of Systems  Constitutional: Negative.   HENT: Negative.   Eyes: Negative.   Respiratory: Negative.   Cardiovascular: Positive for leg swelling. Negative for chest pain.  Gastrointestinal: Negative.   Genitourinary: Negative.   Skin: Positive for wound (both legs).  Neurological: Positive for weakness.  Psychiatric/Behavioral: Negative.      Objective:  BP 130/78   Pulse 85   Temp 98.1 F (36.7  C)   Resp 17   Ht 6' (1.829 m)   Wt 288 lb (130.6 kg)   SpO2 92%   BMI 39.06 kg/m   BP/Weight 03/12/2020 01/31/2020 06/23/5807  Systolic BP 983 382 505  Diastolic BP 78 90 70  Wt. (Lbs) 288 - 298  BMI 39.06 - 40.42    Physical Exam Vitals reviewed.  Constitutional:      Appearance: Normal appearance.     Comments: Wheel chair .  HENT:     Right Ear: Tympanic membrane, ear canal and external ear normal.     Left Ear: Tympanic membrane, ear canal and external ear normal.     Nose: Nose normal.     Mouth/Throat:     Mouth: Mucous membranes are moist.     Pharynx: Oropharynx is clear.  Eyes:     Extraocular Movements: Extraocular movements intact.     Conjunctiva/sclera: Conjunctivae normal.     Pupils: Pupils are equal, round, and reactive to light.  Cardiovascular:     Rate and Rhythm: Normal rate and regular rhythm.     Pulses: Normal pulses.     Heart sounds: Normal heart sounds.  Pulmonary:     Effort: Pulmonary effort is normal.     Breath sounds: Normal breath sounds.  Abdominal:     General: Abdomen is flat. Bowel sounds are normal.     Palpations: Abdomen is soft.  Musculoskeletal:        General: Normal range of motion.     Cervical back: Normal range of motion and neck supple.  Skin:    Capillary Refill:  Capillary refill takes less than 2 seconds.     Findings: Lesion (ulcers both legs) present.     Comments: Legs wrapped in unna boots  Neurological:     Mental Status: He is alert.     Comments: Severe leg weakness.  With chronic lymphedema both legs with ulcers  Psychiatric:        Mood and Affect: Mood normal.       Lab Results  Component Value Date   WBC 12.5 (H) 03/12/2020   HGB 8.8 (L) 03/12/2020   HCT 26.9 (L) 03/12/2020   PLT 203 03/12/2020   GLUCOSE 130 (H) 03/12/2020   CHOL 205 (H) 03/12/2020   TRIG 162 (H) 03/12/2020   HDL 38 (L) 03/12/2020   LDLCALC 138 (H) 03/12/2020   ALT 17 03/12/2020   AST 13 03/12/2020   NA 136 03/12/2020   K 5.0 03/12/2020   CL 93 (L) 03/12/2020   CREATININE 1.70 (H) 03/12/2020   BUN 31 (H) 03/12/2020   CO2 27 03/12/2020   INR 3.6 (H) 03/12/2020   HGBA1C 7.8 (H) 03/12/2020   MICROALBUR 150 10/15/2019      Assessment & Plan:   1. Secondary hypercoagulable state ([place]) - Protime-INR Patient is on Coumadin and variably controlled .  He had Chronic atrial fibrillation  2. Epilepsy, grand mal ([place]) - Phenytoin level, free Patient has a long term seizure problem and on dilantin.  He has repeatedly refused to see neurology.  3. Mixed hyperlipidemia - Lipid panel AN INDIVIDUAL CARE PLAN for hyperlipidemia/ cholesterol was established and reinforced today.  The patient's status was assessed using clinical findings on exam, lab and other diagnostic tests. The patient's disease status was assessed based on evidence-based guidelines and found to be fair controlled. MEDICATIONS were reviewed. SELF MANAGEMENT GOALS have been discussed and patient's success at attaining the goal of low cholesterol  was assessed. RECOMMENDATION given include regular exercise 3 days a week and low cholesterol/low fat diet. CLINICAL SUMMARY including written plan to identify barriers unique to the patient due to social or economic  reasons was discussed.  4.  Diabetic glomerulopathy (HCC) - Hemoglobin A1c - DME Wheelchair manual An individual care plan for diabetes was established and reinforced today.  The patient's status was assessed using clinical findings on exam, labs and diagnostic testing. Patient success at meeting goals based on disease specific evidence-based guidelines and found to be fair controlled. We found out he has no insulin at home and nursing home did not send him home with prescriptions.  He is unable to understand how to use.  Change back to metformin for safety Medications were assessed and patient's understanding of the medical issues , including barriers were assessed. Recommend adherence to a diabetic diet, a graduated exercise program, HgbA1c level is checked quarterly, and urine microalbumin performed yearly .  Annual mono-filament sensation testing performed. Lower blood pressure and control hyperlipidemia is important. Get annual eye exams and annual flu shots and smoking cessation discussed.  Self management goals were discussed.  5. Essential hypertension - CBC with Differential/Platelet - Comprehensive metabolic panel An individual hypertension care plan was established and reinforced today.  The patient's status was assessed using clinical findings on exam and labs or diagnostic tests. The patient's success at meeting treatment goals on disease specific evidence-based guidelines and found to be fair controlled. SELF MANAGEMENT: The patient and I together assessed ways to personally work towards obtaining the recommended goals. RECOMMENDATIONS: avoid decongestants found in common cold remedies, decrease consumption of alcohol, perform routine monitoring of BP with home BP cuff, exercise, reduction of dietary salt, take medicines as prescribed, try not to miss doses and quit smoking.  Regular exercise and maintaining a healthy weight is needed.  Stress reduction may help. A CLINICAL SUMMARY including written plan identify  barriers to care unique to individual due to social or financial issues.  We attempt to mutually creat solutions for individual and family understanding.  6. Lymphedema of both lower extremities - Home Health Chronic leg ulcer both LE for years.  Home health has seen him in the past changing 2 times a week.He had extensive workup with wound clinic and at [facility] but unable to get there. He is unable to care for the wounds himself  7. Peripheral vascular disease ([place]) Patient has poor circulation to feet with skin breakdown  8. Chronic venous hypertension (idiopathic) with ulcer of left lower extremity (HCC) Chronic lymphedema with ulcers and PVD  9. Other diabetic neurological complication associated with type 2 diabetes mellitus ([place]) An individual care plan for diabetes was established and reinforced today.  The patient's status was assessed using clinical findings on exam, labs and diagnostic testing. Patient success at meeting goals based on disease specific evidence-based guidelines and found to be fair controlled. Change to metformin Medications were assessed and patient's understanding of the medical issues , including barriers were assessed. Recommend adherence to a diabetic diet, a graduated exercise program, HgbA1c level is checked quarterly, and urine microalbumin performed yearly .  Annual mono-filament sensation testing performed. Lower blood pressure and control hyperlipidemia is important. Get annual eye exams and annual flu shots and smoking cessation discussed.  Self management goals were discussed.  10. 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[place]) An individual care plan for diabetes was established and reinforced today.  The patient's status was assessed using clinical findings on exam, labs and diagnostic testing.  Patient success at meeting goals based on disease specific evidence-based guidelines and found to be fair controlled. Medications were assessed and patient's understanding of the medical  issues , including barriers were assessed. Recommend adherence to a diabetic diet, a graduated exercise program, HgbA1c level is checked quarterly, and urine microalbumin performed yearly .  Annual mono-filament sensation testing performed. Lower blood pressure and control hyperlipidemia is important. Get annual eye exams and annual flu shots and smoking cessation discussed.  Self management goals were discussed.  11. Schizoaffective disorder, unspecified type [facility]) He has repeatedly refused workup or treatment and family has refused to assist in his care  65. Impaired ambulation Patient is in wheelchair and I have been unable to get him to walk at all in office.  He is a poor candidate to live alone and we discussed this with him and his son.  Patient requires SNF but refuses to get Medicaid.  He has run out of all Medicare paid services and we will need to find another nursing facility to treat him at home.  The last agency refused to see him again.  He calls EMS multiple times a day to get off toilet.  He does not have full ADLs at this time, He has not been considered incompetent and family is of no assistance.  He lives in subsidized housing.    Luz Lex PharmD discussed his medicines with him and son .    Orders Placed This Encounter  Procedures  . DME Wheelchair manual  . CBC with Differential/Platelet  . Comprehensive metabolic panel  . Hemoglobin A1c  . Lipid panel  . Phenytoin level, free  . Protime-INR  . Cardiovascular Risk Assessment  . Home Health     Follow-up: Return in about 2 weeks (around 03/26/2020).  An After Visit Summary was printed and given to the patient.  [place] [phone]

## 2020-03-12 NOTE — Chronic Care Management (AMB) (Signed)
Chronic Care Management Pharmacy Assistant   Name: Matthew Gutierrez  MRN: [id number] DOB: 1955/08/22  Reason for Encounter: Medication Review  03/12/20- Patient seen by Dr Henrene Pastor today for an office visit, son Tycho Cheramie attended and he had some questions regarding his fathers medications due to patient's confusion of what he should be taking while in the office. Donette Larry, CPP informed son that her assistant will contact them when they arrive to patients home around 2 pm to review his medications.  Spoke with son, he began to give me the name of medications patient had at home starting with a new shipment from [facility] mail order that was not opened yet, medications called out:  Warfarin 4 mg #90 1 qd written 03/02/20, Allopurinol 300 mg #90 1 qd written 02/26/20, Atenolol/Chlorthalidone 100/25 mg #90 written 02/20/20, Lisinopril 10 mg 1 qd written 02/20/20, Metformin 1000 mg bid w/meals #38 + Metformin 1000 mg bid #71 + Metformin 1000 mg bid #71 all written on 01/31/20 (3 separate bottles), Isosorbide 120 mg 1 qd #90.  Used bottles of medications called out: Warfarin 4 mg 1 qd written 10/16/19 for #90 but has been used, Toviaz 4 mg #90 bottle with 30-40 left, Pravastatin 40 mg #90 with approximately 1/2 of bottle left, Phenytoin 300 mg #60 with approximately 30-40 left, Furosemide 20 mg #90 bottle with approximately 2/3rd's left, Hydralazine 100 mg tid #141 bottle with approximately 43 left, Amiodarone 2 qd #180 bottle with approximately 60 left, Warfarin 2 mg #14 bottle with approximately 7 left, Carvedilol 3.125 mg bid #60 bottle with approximately 30 left, Torsemide 20 mg 3 bid #240 bottle with approximately 3/4th of bottle left, Allopurinol 300 mg #90 bottle with approximately 1/2 left, Diltiazem 60 mg bid written on 9/8//21 not sure of # on bottle but approximately #40 left, Lisinopril 10 mg #90 filled in June not used, Magnesium written in Jan 2021 not used, Atenolol #90 full bottle, Gabapentin 300 mg  1qhs written 11/20/19 #90 bottle with approximately 50-60 left.  Expired Medications called out: Cephalexin 500 mg 3 bottles on hand some full and some partially full written back in 2019. Mupirocin ointment 2 tubes found from 2/20 and 3/20 both expired, Flonase NS 11 bottles found 6 of them left that weren't expired, test strips for Accucheck meter several containers found that were expired along with meter controls, 1 bottle of Metformin found that was expired. Patient did have 2 boxes of lancets available that were in good standing. Inquired about Lantus 100 u/ml bottle that should be refrigerated and son could not find this medication anywhere in patients home.   Son inquired on what he should be taking and we reviewed the After Visit Summary given by Dr Henrene Pastor today listing the active medications he should be taking. Son had a print out also with him and will be going through his medications and redoing his pill box for the week. Informed son the best way to make sure we discard these medications properly where patient could not retrieve them would be to gather all of the expired medications and extra bottles, place in a trash bag and bring to the office. Donette Larry, CPP can either go through them to confirm and the office will be able to discard of these medications safely. Son aware and very thankful for the help.  While Judithann Sauger and I were on the phone discussing medications, patient was in the background telling son that he shouldn't touch those medications and that he  should be taking the medications he was just discontinued off. He even yelled out that the atenolol/chlorthalidone he takes, that his pain pills. He also mentioned that he pours old medications into new bottles. Patient was very confused and agitated, I tried to calm patient down in explaining what the medications were for and if they were discontinued and if they were expired they will not help with his active problems. Patient was  not happy that son was going through different areas of his home finding bottles of medications but I assured patient this only to help him and to make sure he is not taking anything that could hurt him.  Donette Larry, CPP aware of conversation.   PCP : Lillard Anes, MD  Allergies:   Allergies  Allergen Reactions  . Codeine     Medications: Outpatient Encounter Medications as of 03/12/2020  Medication Sig  . albuterol (PROVENTIL) (2.5 MG/3ML) 0.083% nebulizer solution Inhale 2.5 mg into the lungs every 2 (two) hours.  Marland Kitchen allopurinol (ZYLOPRIM) 300 MG tablet Take 1 tablet (300 mg total) by mouth daily.  . carvedilol (COREG) 3.125 MG tablet Take 1 tablet (3.125 mg total) by mouth 2 (two) times daily with a meal.  . diltiazem (CARDIZEM) 60 MG tablet Take 1 tablet (60 mg total) by mouth 2 (two) times daily.  Marland Kitchen EPINEPHrine 0.3 mg/0.3 mL IJ SOAJ injection Inject 0.3 mg into the muscle as needed for anaphylaxis.  . fesoterodine (TOVIAZ) 4 MG TB24 tablet Take 1 tablet (4 mg total) by mouth daily.  . fluticasone (FLONASE) 50 MCG/ACT nasal spray Place 1 spray into both nostrils daily.  Marland Kitchen gabapentin (NEURONTIN) 300 MG capsule Take 1 capsule (300 mg total) by mouth at bedtime.  . isosorbide mononitrate (IMDUR) 120 MG 24 hr tablet Take 1 tablet (120 mg total) by mouth daily.  Marland Kitchen LANTUS 100 UNIT/ML injection Inject 12 Units into the skin daily.   Marland Kitchen lisinopril (ZESTRIL) 10 MG tablet   . ondansetron (ZOFRAN-ODT) 4 MG disintegrating tablet   . phenytoin (DILANTIN) 300 MG ER capsule Take 1 capsule (300 mg total) by mouth 2 (two) times daily. Patient currently on 100 mg 2 times a day  . polyethylene glycol powder (MIRALAX) 17 GM/SCOOP powder Take 1 Container by mouth once.  . pravastatin (PRAVACHOL) 40 MG tablet Take 1 tablet (40 mg total) by mouth daily.  Marland Kitchen torsemide (DEMADEX) 20 MG tablet Take 3 tablets (60 mg total) by mouth 2 (two) times daily.  Marland Kitchen triamcinolone cream (KENALOG) 0.1 % APPLY TO THE  AFFECTED AREA(S) TWICE DAILY AS NEEDED  . warfarin (COUMADIN) 2 MG tablet Take 2 mg by mouth 3 (three) times a week. Monday, Wednesday and Friday  . warfarin (COUMADIN) 4 MG tablet Take 4 mg by mouth daily. Saturday, Sunday,tuesday and thursday   No facility-administered encounter medications on file as of 03/12/2020.    Current Diagnosis: Patient Active Problem List   Diagnosis Date Noted  . Epilepsy, grand mal ([place])   . Frozen shoulder   . Lumbago   . Peripheral vascular disease ([place])   . Stroke ([place])   . Paroxysmal atrial fibrillation ([place]) 01/07/2020  . Secondary hypercoagulable state ([place]) 01/07/2020  . Cellulitis of leg 12/14/2019  . Atrial fibrillation with RVR ([place]) 12/14/2019  . Diabetic glomerulopathy ([place]) 10/15/2019  . 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[place]) 10/15/2019  . Gout attack 10/06/2019  . Urinary incontinence 07/25/2019  . Shoulder bursitis 07/25/2019  . Acute renal failure superimposed on stage 3 chronic kidney disease (  [place]) 07/23/2019  . Mixed hyperlipidemia 07/23/2019  . Absence epileptic syndrome, not intractable, without status epilepticus ([place]) 07/23/2019  . Atherosclerotic heart disease 07/23/2019  . Diabetic neuropathy ([place]) 07/23/2019  . Benign enlargement of prostate 07/23/2019  . COPD (chronic obstructive pulmonary disease) ([place]) 07/23/2019  . Long term (current) use of anticoagulants 07/23/2019  . Morbid obesity ([place]) 07/23/2019  . Chronic venous hypertension (idiopathic) with ulcer of left lower extremity ([place]) 07/23/2019  . Obstructive sleep apnea 07/23/2019  . Incontinence without sensory awareness 07/23/2019  . Chronic respiratory failure ([place]) 07/23/2019  . Body mass index 50.0-59.9, adult ([place]) 07/23/2019  . H/O acute myocardial infarction 01/22/2018  . Essential hypertension 01/22/2018  . H/O: stroke 01/22/2018  . History of DVT (deep vein thrombosis) 01/22/2018  . Lymphedema of both lower extremities 01/22/2018  . Peripheral vascular disease,  unspecified ([place]) 07/12/2011     Follow-Up:  Pharmacist Review- Patient will need new prescriptions for Accu-check test strips, patient is not checking his sugars and does not have Lantus at all, will need a prescription along with syringe w/needle. Patient will also need a prescription for Warfarin 2 mg.    Son called back and he was able to figure out what patient should be taking and rearranged his pill box. He noticed from [facility] that a few medication he did not see at all- Albuterol, Epinephrine, Ondansetron and needles. Informed son that the Albuterol, Epinephrine and Ondansetron are as needed medications so that is probably why he did not see them on hand but he should have at least one of each for PRN use. Request sent to Donette Larry, CPP who will speak with PCP on getting these medications to [facility] order pharmacy. Son also inquired about Home Health referral that was placed today, informed that the office should be contacting him soon regarding the status. Son again was very appreciative for all the help with his father and medications.  Pattricia Boss, [place] Pharmacist Assistant [phone]

## 2020-03-13 ENCOUNTER — Other Ambulatory Visit: Payer: Self-pay

## 2020-03-13 LAB — COMPREHENSIVE METABOLIC PANEL
ALT: 17 IU/L (ref 0–44)
AST: 13 IU/L (ref 0–40)
Albumin/Globulin Ratio: 1 — ABNORMAL LOW (ref 1.2–2.2)
Albumin: 3.7 g/dL — ABNORMAL LOW (ref 3.8–4.8)
Alkaline Phosphatase: 143 IU/L — ABNORMAL HIGH (ref 44–121)
BUN/Creatinine Ratio: 18 (ref 10–24)
BUN: 31 mg/dL — ABNORMAL HIGH (ref 8–27)
Bilirubin Total: <0.2 mg/dL (ref 0.0–1.2)
CO2: 27 mmol/L (ref 20–29)
Calcium: 8.8 mg/dL (ref 8.6–10.2)
Chloride: 93 mmol/L — ABNORMAL LOW (ref 96–106)
Creatinine, Ser: 1.7 mg/dL — ABNORMAL HIGH (ref 0.76–1.27)
GFR calc Af Amer: 48 mL/min/{1.73_m2} — ABNORMAL LOW (ref >59)
GFR calc non Af Amer: 41 mL/min/{1.73_m2} — ABNORMAL LOW (ref >59)
Globulin, Total: 3.6 g/dL (ref 1.5–4.5)
Glucose: 130 mg/dL — ABNORMAL HIGH (ref 65–99)
Potassium: 5 mmol/L (ref 3.5–5.2)
Sodium: 136 mmol/L (ref 134–144)
Total Protein: 7.3 g/dL (ref 6.0–8.5)

## 2020-03-13 LAB — CBC WITH DIFFERENTIAL/PLATELET
Basophils Absolute: 0.1 10*3/uL (ref 0.0–0.2)
Basos: 1 %
EOS (ABSOLUTE): 0.2 10*3/uL (ref 0.0–0.4)
Eos: 2 %
Hematocrit: 26.9 % — ABNORMAL LOW (ref 37.5–51.0)
Hemoglobin: 8.8 g/dL — ABNORMAL LOW (ref 13.0–17.7)
Immature Grans (Abs): 0.1 10*3/uL (ref 0.0–0.1)
Immature Granulocytes: 1 %
Lymphocytes Absolute: 1.6 10*3/uL (ref 0.7–3.1)
Lymphs: 13 %
MCH: 27.9 pg (ref 26.6–33.0)
MCHC: 32.7 g/dL (ref 31.5–35.7)
MCV: 85 fL (ref 79–97)
Monocytes Absolute: 0.9 10*3/uL (ref 0.1–0.9)
Monocytes: 7 %
Neutrophils Absolute: 9.6 10*3/uL — ABNORMAL HIGH (ref 1.4–7.0)
Neutrophils: 76 %
Platelets: 203 10*3/uL (ref 150–450)
RBC: 3.15 x10E6/uL — ABNORMAL LOW (ref 4.14–5.80)
RDW: 15.5 % — ABNORMAL HIGH (ref 11.6–15.4)
WBC: 12.5 10*3/uL — ABNORMAL HIGH (ref 3.4–10.8)

## 2020-03-13 LAB — HEMOGLOBIN A1C
Est. average glucose Bld gHb Est-mCnc: 177 mg/dL
Hgb A1c MFr Bld: 7.8 % — ABNORMAL HIGH (ref 4.8–5.6)

## 2020-03-13 LAB — LIPID PANEL
Chol/HDL Ratio: 5.4 ratio — ABNORMAL HIGH (ref 0.0–5.0)
Cholesterol, Total: 205 mg/dL — ABNORMAL HIGH (ref 100–199)
HDL: 38 mg/dL — ABNORMAL LOW (ref >39)
LDL Chol Calc (NIH): 138 mg/dL — ABNORMAL HIGH (ref 0–99)
Triglycerides: 162 mg/dL — ABNORMAL HIGH (ref 0–149)
VLDL Cholesterol Cal: 29 mg/dL (ref 5–40)

## 2020-03-13 LAB — PROTIME-INR
INR: 3.6 — ABNORMAL HIGH (ref 0.9–1.2)
Prothrombin Time: 35.6 s — ABNORMAL HIGH (ref 9.1–12.0)

## 2020-03-13 LAB — PHENYTOIN LEVEL, FREE: Phenytoin, Free: 1.7 ug/mL (ref 1.0–2.0)

## 2020-03-13 LAB — CARDIOVASCULAR RISK ASSESSMENT

## 2020-03-13 NOTE — Patient Outreach (Signed)
[facility]) Care Management  03/13/2020  Matthew Gutierrez [birth year] [id number]     Transition of Care Referral  Referral Date: 0/15/21 Referral Source: [facility] Discharge Report Date of Discharge: 03/12/20 Facility: [place]: [facility]    Referral received. Transition of care calls being completed via EMMI-automated calls. RN CM will outreach patient for any red flags received.     Plan: RN CM will close case at this time.   Enzo Montgomery, RN,BSN,CCM [place] Management Telephonic Care Management Coordinator Direct Phone: [phone] Toll Free: [phone] Fax: [phone]

## 2020-03-14 DIAGNOSIS — R262 Difficulty in walking, not elsewhere classified: Secondary | ICD-10-CM | POA: Insufficient documentation

## 2020-03-14 DIAGNOSIS — F259 Schizoaffective disorder, unspecified: Secondary | ICD-10-CM | POA: Insufficient documentation

## 2020-03-17 ENCOUNTER — Other Ambulatory Visit: Payer: Self-pay

## 2020-03-17 MED ORDER — GLUCOSE BLOOD VI STRP: 1.0000 | ORAL_STRIP | 2 refills | Status: DC | PRN

## 2020-03-17 MED ORDER — WARFARIN SODIUM 2 MG PO TABS
2.0000 mg | ORAL_TABLET | ORAL | 3 refills | Status: DC
Start: 2020-03-18 — End: 2021-01-08

## 2020-03-18 ENCOUNTER — Other Ambulatory Visit: Payer: Self-pay

## 2020-03-18 MED ORDER — ALBUTEROL SULFATE (2.5 MG/3ML) 0.083% IN NEBU: 2.5000 mg | INHALATION_SOLUTION | Freq: Four times a day (QID) | RESPIRATORY_TRACT | 2 refills | Status: DC | PRN

## 2020-03-18 MED ORDER — ONDANSETRON 4 MG PO TBDP: 4.0000 mg | ORAL_TABLET | Freq: Three times a day (TID) | ORAL | 2 refills | Status: DC | PRN

## 2020-03-18 MED ORDER — EPINEPHRINE 0.3 MG/0.3ML IJ SOAJ: 0.3000 mg | INTRAMUSCULAR | 2 refills | Status: DC | PRN

## 2020-03-18 MED ORDER — ALLOPURINOL 300 MG PO TABS: 300.0000 mg | ORAL_TABLET | Freq: Every day | ORAL | 1 refills | Status: DC

## 2020-03-20 DIAGNOSIS — N170 Acute kidney failure with tubular necrosis: Secondary | ICD-10-CM | POA: Diagnosis not present

## 2020-03-20 DIAGNOSIS — M79622 Pain in left upper arm: Secondary | ICD-10-CM | POA: Diagnosis not present

## 2020-03-20 DIAGNOSIS — I480 Paroxysmal atrial fibrillation: Secondary | ICD-10-CM | POA: Diagnosis not present

## 2020-03-20 DIAGNOSIS — I517 Cardiomegaly: Secondary | ICD-10-CM | POA: Diagnosis not present

## 2020-03-20 DIAGNOSIS — M5410 Radiculopathy, site unspecified: Secondary | ICD-10-CM | POA: Diagnosis not present

## 2020-03-20 DIAGNOSIS — D649 Anemia, unspecified: Secondary | ICD-10-CM | POA: Diagnosis not present

## 2020-03-20 DIAGNOSIS — N179 Acute kidney failure, unspecified: Secondary | ICD-10-CM | POA: Diagnosis not present

## 2020-03-20 DIAGNOSIS — I129 Hypertensive chronic kidney disease with stage 1 through stage 4 chronic kidney disease, or unspecified chronic kidney disease: Secondary | ICD-10-CM | POA: Diagnosis not present

## 2020-03-20 DIAGNOSIS — N183 Chronic kidney disease, stage 3 unspecified: Secondary | ICD-10-CM | POA: Diagnosis not present

## 2020-03-20 DIAGNOSIS — E119 Type 2 diabetes mellitus without complications: Secondary | ICD-10-CM | POA: Diagnosis not present

## 2020-03-20 DIAGNOSIS — J9811 Atelectasis: Secondary | ICD-10-CM | POA: Diagnosis not present

## 2020-03-20 DIAGNOSIS — M109 Gout, unspecified: Secondary | ICD-10-CM | POA: Diagnosis not present

## 2020-03-20 DIAGNOSIS — R791 Abnormal coagulation profile: Secondary | ICD-10-CM | POA: Diagnosis not present

## 2020-03-20 DIAGNOSIS — G40909 Epilepsy, unspecified, not intractable, without status epilepticus: Secondary | ICD-10-CM | POA: Diagnosis not present

## 2020-03-20 DIAGNOSIS — M4802 Spinal stenosis, cervical region: Secondary | ICD-10-CM | POA: Diagnosis not present

## 2020-03-20 DIAGNOSIS — M47812 Spondylosis without myelopathy or radiculopathy, cervical region: Secondary | ICD-10-CM | POA: Diagnosis not present

## 2020-03-22 DIAGNOSIS — M5410 Radiculopathy, site unspecified: Secondary | ICD-10-CM | POA: Diagnosis not present

## 2020-03-22 DIAGNOSIS — I480 Paroxysmal atrial fibrillation: Secondary | ICD-10-CM | POA: Diagnosis not present

## 2020-03-22 DIAGNOSIS — M109 Gout, unspecified: Secondary | ICD-10-CM | POA: Diagnosis not present

## 2020-03-22 DIAGNOSIS — G40909 Epilepsy, unspecified, not intractable, without status epilepticus: Secondary | ICD-10-CM | POA: Diagnosis not present

## 2020-03-22 DIAGNOSIS — N183 Chronic kidney disease, stage 3 unspecified: Secondary | ICD-10-CM | POA: Diagnosis not present

## 2020-03-22 DIAGNOSIS — N170 Acute kidney failure with tubular necrosis: Secondary | ICD-10-CM | POA: Diagnosis not present

## 2020-03-22 DIAGNOSIS — I129 Hypertensive chronic kidney disease with stage 1 through stage 4 chronic kidney disease, or unspecified chronic kidney disease: Secondary | ICD-10-CM | POA: Diagnosis not present

## 2020-03-22 DIAGNOSIS — E119 Type 2 diabetes mellitus without complications: Secondary | ICD-10-CM | POA: Diagnosis not present

## 2020-03-22 DIAGNOSIS — M79622 Pain in left upper arm: Secondary | ICD-10-CM | POA: Diagnosis not present

## 2020-03-23 ENCOUNTER — Telehealth: Payer: Self-pay

## 2020-03-23 DIAGNOSIS — I480 Paroxysmal atrial fibrillation: Secondary | ICD-10-CM | POA: Diagnosis not present

## 2020-03-23 DIAGNOSIS — G40909 Epilepsy, unspecified, not intractable, without status epilepticus: Secondary | ICD-10-CM | POA: Diagnosis not present

## 2020-03-23 DIAGNOSIS — N170 Acute kidney failure with tubular necrosis: Secondary | ICD-10-CM | POA: Diagnosis not present

## 2020-03-23 NOTE — Chronic Care Management (AMB) (Signed)
Pharmacist spoke with patient via telephone today. Patient states he just arrived home from the hospital and is uncertain about his medication changes. Pharmacist encouraged him to find his discharged medication list and we could review over the phone today until patient can come into office for appointment with pharmacist. Patient declined scheduling visit at this time.. Patient states he isn't sure where it is and will look through his things and call pharmacist back.   Patient states he is uncertain what medications he has or needs at this time. Patient's son is in [place] and not available. Patient states he has no one to help him and not sure when his temporary home health service will begin. He reports beginning with [facility]/01.   Pharmacist encouraged patient to call back with questions on medications.   Sherre Poot, PharmD, [facility] Clinical Pharmacist [facility] [phone] (office) [phone] (mobile)

## 2020-03-24 ENCOUNTER — Other Ambulatory Visit: Payer: Self-pay

## 2020-03-25 ENCOUNTER — Other Ambulatory Visit: Payer: Self-pay | Admitting: Legal Medicine

## 2020-03-25 DIAGNOSIS — N3942 Incontinence without sensory awareness: Secondary | ICD-10-CM

## 2020-03-26 ENCOUNTER — Encounter: Payer: Self-pay | Admitting: Legal Medicine

## 2020-03-26 ENCOUNTER — Ambulatory Visit (INDEPENDENT_AMBULATORY_CARE_PROVIDER_SITE_OTHER): Payer: Medicare HMO | Admitting: Legal Medicine

## 2020-03-26 VITALS — BP 128/76 | HR 76 | Temp 98.0°F

## 2020-03-26 DIAGNOSIS — I739 Peripheral vascular disease, unspecified: Secondary | ICD-10-CM | POA: Diagnosis not present

## 2020-03-26 DIAGNOSIS — I10 Essential (primary) hypertension: Secondary | ICD-10-CM | POA: Diagnosis not present

## 2020-03-26 DIAGNOSIS — G40A09 Absence epileptic syndrome, not intractable, without status epilepticus: Secondary | ICD-10-CM | POA: Diagnosis not present

## 2020-03-26 DIAGNOSIS — I890 Lymphedema, not elsewhere classified: Secondary | ICD-10-CM | POA: Diagnosis not present

## 2020-03-26 DIAGNOSIS — E1121 Type 2 diabetes mellitus with diabetic nephropathy: Secondary | ICD-10-CM | POA: Diagnosis not present

## 2020-03-26 DIAGNOSIS — G4733 Obstructive sleep apnea (adult) (pediatric): Secondary | ICD-10-CM | POA: Diagnosis not present

## 2020-03-26 DIAGNOSIS — Z7901 Long term (current) use of anticoagulants: Secondary | ICD-10-CM | POA: Diagnosis not present

## 2020-03-26 NOTE — Progress Notes (Signed)
Subjective:  Patient ID: Matthew Gutierrez, male    DOB: October 29, 1955  Age: 64 y.o. MRN: [id number]  Chief Complaint  Patient presents with  . Hypertension    HPI chronic visit  Patient presents for follow up of hypertension.  Patient tolerating carvedilol well with side effects.  Patient was diagnosed with hypertension 2010 so has been treated for hypertension for 10 years.Patient is working on maintaining diet and exercise regimen and follows up as directed. Complication include none.  Patient present with type 2 diabetes.  Specifically, this is type 2, noninsulin requiring diabetes, complicated by polyneuropathy.  Compliance with treatment has been good; patient take medicines as directed, maintains diet and exercise regimen, follows up as directed, and is keeping glucose diary.  Date of  diagnosis 2010.  Depression screen has been performed.Tobacco screen nonsmoker. Current medicines for diabetes metformin.  Patient is on none for renal protection and pravastatin for cholesterol control.  Patient performs foot exams daily and last ophthalmologic exam was no  OSA he is on o2 at night but he has respiratory failure and should be on oxygen full time..   Current Outpatient Medications on File Prior to Visit  Medication Sig Dispense Refill  . albuterol (PROVENTIL) (2.5 MG/3ML) 0.083% nebulizer solution Inhale 3 mLs (2.5 mg total) into the lungs every 6 (six) hours as needed for wheezing or shortness of breath. 75 mL 2  . allopurinol (ZYLOPRIM) 300 MG tablet Take 1 tablet (300 mg total) by mouth daily. 90 tablet 1  . carvedilol (COREG) 3.125 MG tablet Take 1 tablet (3.125 mg total) by mouth 2 (two) times daily with a meal. 60 tablet 6  . diltiazem (CARDIZEM) 60 MG tablet Take 1 tablet (60 mg total) by mouth 2 (two) times daily. 60 tablet 6  . EPINEPHrine 0.3 mg/0.3 mL IJ SOAJ injection Inject 0.3 mg into the muscle as needed for anaphylaxis. 1 each 2  . fluticasone (FLONASE) 50 MCG/ACT nasal spray  Place 1 spray into both nostrils daily. 18.2 mL 2  . gabapentin (NEURONTIN) 300 MG capsule Take 1 capsule (300 mg total) by mouth at bedtime. 90 capsule 2  . glucose blood test strip 1 each by Other route as needed. Use as instructed 100 each 2  . isosorbide mononitrate (IMDUR) 120 MG 24 hr tablet Take 1 tablet (120 mg total) by mouth daily. 90 tablet 2  . metFORMIN (GLUCOPHAGE) 1000 MG tablet Take 1,000 mg by mouth 2 (two) times daily with a meal.    . phenytoin (DILANTIN) 300 MG ER capsule Take 1 capsule (300 mg total) by mouth 2 (two) times daily. Patient currently on 100 mg 2 times a day 180 capsule 2  . polyethylene glycol powder (MIRALAX) 17 GM/SCOOP powder Take 1 Container by mouth once.    . pravastatin (PRAVACHOL) 40 MG tablet Take 1 tablet (40 mg total) by mouth daily. 90 tablet 2  . torsemide (DEMADEX) 20 MG tablet Take 20 mg by mouth 2 (two) times daily.    . TOVIAZ 4 MG TB24 tablet TAKE 1 TABLET EVERY DAY 90 tablet 2  . triamcinolone cream (KENALOG) 0.1 % APPLY TO THE AFFECTED AREA(S) TWICE DAILY AS NEEDED 454 g 2  . warfarin (COUMADIN) 2 MG tablet Take 1 tablet (2 mg total) by mouth 3 (three) times a week. Monday, Wednesday and Friday 30 tablet 3  . warfarin (COUMADIN) 4 MG tablet Take 4 mg by mouth daily. Saturday, Sunday,tuesday and thursday     No current facility-administered  medications on file prior to visit.   Past Medical History:  Diagnosis Date  . Absence epileptic syndrome, not intractable, without status epilepticus ([place]) 07/23/2019  . Acute renal failure superimposed on stage 3 chronic kidney disease ([place]) 07/23/2019  . Atherosclerotic heart disease 07/23/2019  . Atrial fibrillation with RVR ([place]) 12/14/2019  . Benign enlargement of prostate 07/23/2019  . Body mass index 50.0-59.9, adult ([place]) 07/23/2019  . Cellulitis of leg 12/14/2019  . Chronic respiratory failure with hypoxia ([place]) 07/23/2019  . Chronic venous hypertension (idiopathic) with ulcer of left lower  extremity ([place]) 07/23/2019  . COPD (chronic obstructive pulmonary disease) ([place]) 07/23/2019  . Diabetic glomerulopathy ([place]) 10/15/2019  . Diabetic neuropathy ([place]) 07/23/2019  . 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[place]) 10/15/2019  . Epilepsy, grand mal ([place])   . Essential hypertension 01/22/2018  . Frozen shoulder    Right Shoulder  . Gout attack 10/06/2019  . H/O acute myocardial infarction 01/22/2018  . H/O: stroke 01/22/2018  . History of DVT (deep vein thrombosis) 01/22/2018  . Incontinence without sensory awareness 07/23/2019  . Long term (current) use of anticoagulants 07/23/2019  . Lumbago   . Lymphedema of both lower extremities 01/22/2018  . Mixed hyperlipidemia 07/23/2019  . Morbid obesity ([place]) 07/23/2019  . Obstructive sleep apnea 07/23/2019  . Paroxysmal atrial fibrillation ([place]) 01/07/2020  . Peripheral vascular disease ([place])   . Peripheral vascular disease, unspecified ([place]) 07/12/2011  . Secondary hypercoagulable state ([place]) 01/07/2020  . Shoulder bursitis 07/25/2019  . Stroke ([place])   . Urinary incontinence 07/25/2019   Past Surgical History:  Procedure Laterality Date  . CORONARY STENT PLACEMENT      Family History  Problem Relation Age of Onset  . Cancer Mother   . Stroke Father   . Hypertension Other   . Stroke Other   . Hyperlipidemia Other   . Alcohol abuse Other   . Heart disease Other   . Cancer Other    Social History   Socioeconomic History  . Marital status: Divorced    Spouse name: Not on file  . Number of children: 2  . Years of education: Not on file  . Highest education level: Not on file  Occupational History  . Occupation: Disability  Tobacco Use  . Smoking status: Former Smoker    Quit date: 10/28/2009    Years since quitting: 10.4  . Smokeless tobacco: Never Used  Substance and Sexual Activity  . Alcohol use: Not Currently    Comment: occassionally  . Drug use: No  . Sexual activity: Not Currently  Other Topics Concern  . Not on file  Social History  Narrative  . Not on file   Social Determinants of Health   Financial Resource Strain:   . Difficulty of Paying Living Expenses: Not on file  Food Insecurity:   . Worried About Charity fundraiser in the Last Year: Not on file  . Ran Out of Food in the Last Year: Not on file  Transportation Needs:   . Lack of Transportation (Medical): Not on file  . Lack of Transportation (Non-Medical): Not on file  Physical Activity: Inactive  . Days of Exercise per Week: 0 days  . Minutes of Exercise per Session: 0 min  Stress:   . Feeling of Stress : Not on file  Social Connections:   . Frequency of Communication with Friends and Family: Not on file  . Frequency of Social Gatherings with Friends and Family: Not on file  . Attends Religious Services:  Not on file  . Active Member of Clubs or Organizations: Not on file  . Attends Archivist Meetings: Not on file  . Marital Status: Not on file    Review of Systems  Constitutional: Negative.   HENT: Negative.   Respiratory: Positive for shortness of breath. Negative for wheezing.   Cardiovascular: Positive for palpitations. Negative for chest pain and leg swelling.  Endocrine: Negative.   Genitourinary: Negative.   Musculoskeletal: Negative.   Skin: Negative.   Neurological: Negative.        Difficulty walking and tremor from weakness  Psychiatric/Behavioral: Positive for confusion.     Objective:  BP 128/76 (BP Location: Right Arm, Patient Position: Sitting, Cuff Size: Normal)   Pulse 76   Temp 98 F (36.7 C) (Temporal)   SpO2 99%   BP/Weight 03/26/2020 32/44/0102 11/28/5364  Systolic BP 440 347 425  Diastolic BP 76 78 90  Wt. (Lbs) - 288 -  BMI - 39.06 -    Physical Exam Vitals reviewed.  Constitutional:      Appearance: Normal appearance.  HENT:     Head: Normocephalic and atraumatic.     Right Ear: Tympanic membrane, ear canal and external ear normal.     Left Ear: Tympanic membrane, ear canal and external ear  normal.     Mouth/Throat:     Mouth: Mucous membranes are moist.     Pharynx: Oropharynx is clear.  Eyes:     Extraocular Movements: Extraocular movements intact.     Conjunctiva/sclera: Conjunctivae normal.     Pupils: Pupils are equal, round, and reactive to light.  Cardiovascular:     Rate and Rhythm: Normal rate and regular rhythm.     Pulses: Normal pulses.     Heart sounds: Normal heart sounds.  Pulmonary:     Effort: Pulmonary effort is normal.     Breath sounds: Normal breath sounds.  Abdominal:     General: Abdomen is flat. Bowel sounds are normal.     Palpations: Abdomen is soft.  Musculoskeletal:     Cervical back: Normal range of motion.     Comments: Generalized weakness  Skin:    Comments: Chronic lymphedema  Neurological:     General: No focal deficit present.     Mental Status: He is alert and oriented to person, place, and time.     Motor: Weakness present.     Gait: Gait abnormal.     Diabetic Foot Exam - Simple   Simple Foot Form Diabetic Foot exam was performed with the following findings: Yes 03/26/2020 11:30 AM  Visual Inspection See comments: Yes Sensation Testing See comments: Yes Pulse Check Posterior Tibialis and Dorsalis pulse intact bilaterally: Yes Comments No monofilament sensation feet, chronic lymphedema      Lab Results  Component Value Date   WBC 12.5 (H) 03/12/2020   HGB 8.8 (L) 03/12/2020   HCT 26.9 (L) 03/12/2020   PLT 203 03/12/2020   GLUCOSE 130 (H) 03/12/2020   CHOL 205 (H) 03/12/2020   TRIG 162 (H) 03/12/2020   HDL 38 (L) 03/12/2020   LDLCALC 138 (H) 03/12/2020   ALT 17 03/12/2020   AST 13 03/12/2020   NA 136 03/12/2020   K 5.0 03/12/2020   CL 93 (L) 03/12/2020   CREATININE 1.70 (H) 03/12/2020   BUN 31 (H) 03/12/2020   CO2 27 03/12/2020   INR 3.6 (H) 03/12/2020   HGBA1C 7.8 (H) 03/12/2020   MICROALBUR 150 10/15/2019  Assessment & Plan:   1. Essential hypertension - CBC with Differential/Platelet -  Comprehensive metabolic panel An individual hypertension care plan was established and reinforced today.  The patient's status was assessed using clinical findings on exam and labs or diagnostic tests. The patient's success at meeting treatment goals on disease specific evidence-based guidelines and found to be well controlled. SELF MANAGEMENT: The patient and I together assessed ways to personally work towards obtaining the recommended goals. RECOMMENDATIONS: avoid decongestants found in common cold remedies, decrease consumption of alcohol, perform routine monitoring of BP with home BP cuff, exercise, reduction of dietary salt, take medicines as prescribed, try not to miss doses and quit smoking.  Regular exercise and maintaining a healthy weight is needed.  Stress reduction may help. A CLINICAL SUMMARY including written plan identify barriers to care unique to individual due to social or financial issues.  We attempt to mutually creat solutions for individual and family understanding.  2. Peripheral vascular disease ([place]) AN INDIVIDUAL CARE PLAN for PVD was established and reinforced today.  The patient's status was assessed using clinical findings on exam, labs, and other diagnostic testing. Patient's success at meeting treatment goals based on disease specific evidence-bassed guidelines and found to be in poor control. RECOMMENDATIONS include maintaining present medicines and treatment.  3. Diabetic glomerulopathy ([facility]) - Hemoglobin A1c An individual care plan for diabetes was established and reinforced today.  The patient's status was assessed using clinical findings on exam, labs and diagnostic testing. Patient success at meeting goals based on disease specific evidence-based guidelines and found to be fair controlled. Medications were assessed and patient's understanding of the medical issues , including barriers were assessed. Recommend adherence to a diabetic diet, a graduated exercise  program, HgbA1c level is checked quarterly, and urine microalbumin performed yearly .  Annual mono-filament sensation testing performed. Lower blood pressure and control hyperlipidemia is important. Get annual eye exams and annual flu shots and smoking cessation discussed.  Self management goals were discussed.  4. Absence epileptic syndrome, not intractable, without status epilepticus ([place]) Patient is on phenytoin and not had any seizures lately  5. Lymphedema of both lower extremities Patient has chronic lymphedema for years with repeated ulcerations and has been having unna boots changes with home health  6. Obstructive sleep apnea AN INDIVIDUAL CARE PLAN for OSA was established and reinforced today.  The patient's status was assessed using clinical findings on exam, labs, and other diagnostic testing. Patient's success at meeting treatment goals based on disease specific evidence-bassed guidelines and found to be in poor control. RECOMMENDATIONS include maintaining nocturnal oxygen which he will be  Compliant with .  7. Chronic anticoagulation - Protime-INR Need to check protime since he has had recurrent DVT and PE   He will from now [facility] cared [facility] by [facility] who will assume all care.   Orders Placed This Encounter  Procedures  . CBC with Differential/Platelet  . Comprehensive metabolic panel  . Hemoglobin A1c  . Protime-INR     Follow-up: Return if symptoms worsen or fail to improve.  An After Visit Summary was printed and given to the patient.  [place] [phone]

## 2020-03-27 LAB — COMPREHENSIVE METABOLIC PANEL
ALT: 30 IU/L (ref 0–44)
AST: 16 IU/L (ref 0–40)
Albumin/Globulin Ratio: 1.1 — ABNORMAL LOW (ref 1.2–2.2)
Albumin: 4.3 g/dL (ref 3.8–4.8)
Alkaline Phosphatase: 154 IU/L — ABNORMAL HIGH (ref 44–121)
BUN/Creatinine Ratio: 38 — ABNORMAL HIGH (ref 10–24)
BUN: 59 mg/dL — ABNORMAL HIGH (ref 8–27)
Bilirubin Total: 0.4 mg/dL (ref 0.0–1.2)
CO2: 23 mmol/L (ref 20–29)
Calcium: 9.3 mg/dL (ref 8.6–10.2)
Chloride: 94 mmol/L — ABNORMAL LOW (ref 96–106)
Creatinine, Ser: 1.57 mg/dL — ABNORMAL HIGH (ref 0.76–1.27)
GFR calc Af Amer: 53 mL/min/{1.73_m2} — ABNORMAL LOW (ref >59)
GFR calc non Af Amer: 46 mL/min/{1.73_m2} — ABNORMAL LOW (ref >59)
Globulin, Total: 3.8 g/dL (ref 1.5–4.5)
Glucose: 184 mg/dL — ABNORMAL HIGH (ref 65–99)
Potassium: 5 mmol/L (ref 3.5–5.2)
Sodium: 137 mmol/L (ref 134–144)
Total Protein: 8.1 g/dL (ref 6.0–8.5)

## 2020-03-27 LAB — CBC WITH DIFFERENTIAL/PLATELET
Basophils Absolute: 0.1 10*3/uL (ref 0.0–0.2)
Basos: 0 %
EOS (ABSOLUTE): 0.1 10*3/uL (ref 0.0–0.4)
Eos: 1 %
Hematocrit: 28.6 % — ABNORMAL LOW (ref 37.5–51.0)
Hemoglobin: 9.2 g/dL — ABNORMAL LOW (ref 13.0–17.7)
Immature Grans (Abs): 0 10*3/uL (ref 0.0–0.1)
Immature Granulocytes: 0 %
Lymphocytes Absolute: 1.3 10*3/uL (ref 0.7–3.1)
Lymphs: 11 %
MCH: 28 pg (ref 26.6–33.0)
MCHC: 32.2 g/dL (ref 31.5–35.7)
MCV: 87 fL (ref 79–97)
Monocytes Absolute: 1 10*3/uL — ABNORMAL HIGH (ref 0.1–0.9)
Monocytes: 9 %
Neutrophils Absolute: 9.2 10*3/uL — ABNORMAL HIGH (ref 1.4–7.0)
Neutrophils: 79 %
Platelets: 242 10*3/uL (ref 150–450)
RBC: 3.29 x10E6/uL — ABNORMAL LOW (ref 4.14–5.80)
RDW: 15.7 % — ABNORMAL HIGH (ref 11.6–15.4)
WBC: 11.7 10*3/uL — ABNORMAL HIGH (ref 3.4–10.8)

## 2020-03-27 LAB — PROTIME-INR
INR: 3.2 — ABNORMAL HIGH (ref 0.9–1.2)
Prothrombin Time: 32 s — ABNORMAL HIGH (ref 9.1–12.0)

## 2020-03-27 LAB — HEMOGLOBIN A1C
Est. average glucose Bld gHb Est-mCnc: 160 mg/dL
Hgb A1c MFr Bld: 7.2 % — ABNORMAL HIGH (ref 4.8–5.6)

## 2020-03-27 NOTE — Progress Notes (Signed)
WBC still up, anemia improving, glucose 184, kidney tests stable stage 3, liver tests normal, A1c 7.2 good, INR 3.2 still high, use 2 mg for 5 days and 4mg  for 2 days, get protime recheck at [facility]

## 2020-03-28 DIAGNOSIS — R531 Weakness: Secondary | ICD-10-CM | POA: Diagnosis not present

## 2020-03-28 DIAGNOSIS — R5381 Other malaise: Secondary | ICD-10-CM | POA: Diagnosis not present

## 2020-03-28 DIAGNOSIS — M5410 Radiculopathy, site unspecified: Secondary | ICD-10-CM | POA: Diagnosis not present

## 2020-03-28 DIAGNOSIS — R52 Pain, unspecified: Secondary | ICD-10-CM | POA: Diagnosis not present

## 2020-04-03 ENCOUNTER — Ambulatory Visit: Payer: Medicare HMO | Admitting: Cardiology

## 2020-04-12 DIAGNOSIS — I480 Paroxysmal atrial fibrillation: Secondary | ICD-10-CM | POA: Diagnosis not present

## 2020-04-15 ENCOUNTER — Telehealth: Payer: Self-pay | Admitting: Cardiology

## 2020-04-15 NOTE — Telephone Encounter (Signed)
Message left to call back

## 2020-04-15 NOTE — Telephone Encounter (Signed)
Matthew Gutierrez on [place] who was wanting to know why Dr. Geraldo Pitter was seeing her brother. I explained to the pt if he had a heart condition they may have consulted them. I am unable to speak to the exact reason. Advised her to call the hospital staff. She verbalized understanding and had no additional questions.

## 2020-04-15 NOTE — Telephone Encounter (Signed)
Rise Paganini is calling stating she was advised that Dr. Geraldo Pitter saw Matthew Gutierrez while he was admitted at [facility]. She is requesting a call from the nurse to know why he was seen by him in the hospital as well as the status on his heart condition. Please advise.

## 2020-07-23 ENCOUNTER — Ambulatory Visit: Payer: Medicare HMO

## 2020-12-28 ENCOUNTER — Emergency Department (HOSPITAL_COMMUNITY): Payer: No Typology Code available for payment source

## 2020-12-28 ENCOUNTER — Inpatient Hospital Stay (HOSPITAL_COMMUNITY)
Admission: EM | Admit: 2020-12-28 | Discharge: 2021-01-07 | DRG: 871 | Disposition: A | Discharge status: Skilled Nursing Facility | Payer: No Typology Code available for payment source | Source: Skilled Nursing Facility | Attending: Internal Medicine | Admitting: Internal Medicine

## 2020-12-28 DIAGNOSIS — N1832 Chronic kidney disease, stage 3b: Secondary | ICD-10-CM | POA: Diagnosis present

## 2020-12-28 DIAGNOSIS — R4182 Altered mental status, unspecified: Secondary | ICD-10-CM

## 2020-12-28 DIAGNOSIS — A4159 Other Gram-negative sepsis: Principal | ICD-10-CM | POA: Diagnosis present

## 2020-12-28 DIAGNOSIS — E860 Dehydration: Secondary | ICD-10-CM | POA: Diagnosis present

## 2020-12-28 DIAGNOSIS — Z955 Presence of coronary angioplasty implant and graft: Secondary | ICD-10-CM

## 2020-12-28 DIAGNOSIS — R7989 Other specified abnormal findings of blood chemistry: Secondary | ICD-10-CM | POA: Diagnosis present

## 2020-12-28 DIAGNOSIS — L89620 Pressure ulcer of left heel, unstageable: Secondary | ICD-10-CM | POA: Diagnosis present

## 2020-12-28 DIAGNOSIS — E1142 Type 2 diabetes mellitus with diabetic polyneuropathy: Secondary | ICD-10-CM | POA: Diagnosis present

## 2020-12-28 DIAGNOSIS — Z8673 Personal history of transient ischemic attack (TIA), and cerebral infarction without residual deficits: Secondary | ICD-10-CM

## 2020-12-28 DIAGNOSIS — I130 Hypertensive heart and chronic kidney disease with heart failure and stage 1 through stage 4 chronic kidney disease, or unspecified chronic kidney disease: Secondary | ICD-10-CM | POA: Diagnosis present

## 2020-12-28 DIAGNOSIS — J449 Chronic obstructive pulmonary disease, unspecified: Secondary | ICD-10-CM | POA: Diagnosis present

## 2020-12-28 DIAGNOSIS — L89510 Pressure ulcer of right ankle, unstageable: Secondary | ICD-10-CM | POA: Diagnosis present

## 2020-12-28 DIAGNOSIS — L89610 Pressure ulcer of right heel, unstageable: Secondary | ICD-10-CM | POA: Diagnosis present

## 2020-12-28 DIAGNOSIS — G9340 Encephalopathy, unspecified: Secondary | ICD-10-CM

## 2020-12-28 DIAGNOSIS — K8062 Calculus of gallbladder and bile duct with acute cholecystitis without obstruction: Secondary | ICD-10-CM | POA: Diagnosis present

## 2020-12-28 DIAGNOSIS — G928 Other toxic encephalopathy: Secondary | ICD-10-CM | POA: Diagnosis present

## 2020-12-28 DIAGNOSIS — I480 Paroxysmal atrial fibrillation: Secondary | ICD-10-CM | POA: Diagnosis present

## 2020-12-28 DIAGNOSIS — R069 Unspecified abnormalities of breathing: Secondary | ICD-10-CM

## 2020-12-28 DIAGNOSIS — R0603 Acute respiratory distress: Secondary | ICD-10-CM

## 2020-12-28 DIAGNOSIS — Z9119 Patient's noncompliance with other medical treatment and regimen: Secondary | ICD-10-CM

## 2020-12-28 DIAGNOSIS — I252 Old myocardial infarction: Secondary | ICD-10-CM

## 2020-12-28 DIAGNOSIS — I482 Chronic atrial fibrillation, unspecified: Secondary | ICD-10-CM | POA: Diagnosis present

## 2020-12-28 DIAGNOSIS — J9622 Acute and chronic respiratory failure with hypercapnia: Secondary | ICD-10-CM | POA: Diagnosis present

## 2020-12-28 DIAGNOSIS — K819 Cholecystitis, unspecified: Secondary | ICD-10-CM

## 2020-12-28 DIAGNOSIS — J440 Chronic obstructive pulmonary disease with acute lower respiratory infection: Secondary | ICD-10-CM | POA: Diagnosis present

## 2020-12-28 DIAGNOSIS — Z823 Family history of stroke: Secondary | ICD-10-CM

## 2020-12-28 DIAGNOSIS — G40909 Epilepsy, unspecified, not intractable, without status epilepticus: Secondary | ICD-10-CM

## 2020-12-28 DIAGNOSIS — E876 Hypokalemia: Secondary | ICD-10-CM | POA: Diagnosis not present

## 2020-12-28 DIAGNOSIS — E1165 Type 2 diabetes mellitus with hyperglycemia: Secondary | ICD-10-CM | POA: Diagnosis present

## 2020-12-28 DIAGNOSIS — R6520 Severe sepsis without septic shock: Secondary | ICD-10-CM | POA: Diagnosis present

## 2020-12-28 DIAGNOSIS — N390 Urinary tract infection, site not specified: Secondary | ICD-10-CM | POA: Diagnosis present

## 2020-12-28 DIAGNOSIS — E1151 Type 2 diabetes mellitus with diabetic peripheral angiopathy without gangrene: Secondary | ICD-10-CM | POA: Diagnosis present

## 2020-12-28 DIAGNOSIS — I2510 Atherosclerotic heart disease of native coronary artery without angina pectoris: Secondary | ICD-10-CM | POA: Diagnosis present

## 2020-12-28 DIAGNOSIS — N400 Enlarged prostate without lower urinary tract symptoms: Secondary | ICD-10-CM | POA: Diagnosis present

## 2020-12-28 DIAGNOSIS — I10 Essential (primary) hypertension: Secondary | ICD-10-CM | POA: Diagnosis present

## 2020-12-28 DIAGNOSIS — Z6839 Body mass index (BMI) 39.0-39.9, adult: Secondary | ICD-10-CM

## 2020-12-28 DIAGNOSIS — Z87891 Personal history of nicotine dependence: Secondary | ICD-10-CM

## 2020-12-28 DIAGNOSIS — Z8249 Family history of ischemic heart disease and other diseases of the circulatory system: Secondary | ICD-10-CM

## 2020-12-28 DIAGNOSIS — Z7901 Long term (current) use of anticoagulants: Secondary | ICD-10-CM

## 2020-12-28 DIAGNOSIS — E871 Hypo-osmolality and hyponatremia: Secondary | ICD-10-CM | POA: Diagnosis present

## 2020-12-28 DIAGNOSIS — F0390 Unspecified dementia without behavioral disturbance: Secondary | ICD-10-CM | POA: Diagnosis present

## 2020-12-28 DIAGNOSIS — Z5329 Procedure and treatment not carried out because of patient's decision for other reasons: Secondary | ICD-10-CM | POA: Diagnosis not present

## 2020-12-28 DIAGNOSIS — L89520 Pressure ulcer of left ankle, unstageable: Secondary | ICD-10-CM | POA: Diagnosis present

## 2020-12-28 DIAGNOSIS — I5032 Chronic diastolic (congestive) heart failure: Secondary | ICD-10-CM | POA: Diagnosis present

## 2020-12-28 DIAGNOSIS — Z419 Encounter for procedure for purposes other than remedying health state, unspecified: Secondary | ICD-10-CM

## 2020-12-28 DIAGNOSIS — A419 Sepsis, unspecified organism: Secondary | ICD-10-CM

## 2020-12-28 DIAGNOSIS — J9601 Acute respiratory failure with hypoxia: Secondary | ICD-10-CM

## 2020-12-28 DIAGNOSIS — K810 Acute cholecystitis: Secondary | ICD-10-CM

## 2020-12-28 DIAGNOSIS — Z7984 Long term (current) use of oral hypoglycemic drugs: Secondary | ICD-10-CM

## 2020-12-28 DIAGNOSIS — Z20822 Contact with and (suspected) exposure to covid-19: Secondary | ICD-10-CM | POA: Diagnosis present

## 2020-12-28 DIAGNOSIS — K8050 Calculus of bile duct without cholangitis or cholecystitis without obstruction: Secondary | ICD-10-CM

## 2020-12-28 DIAGNOSIS — E872 Acidosis: Secondary | ICD-10-CM | POA: Diagnosis present

## 2020-12-28 DIAGNOSIS — J690 Pneumonitis due to inhalation of food and vomit: Secondary | ICD-10-CM | POA: Diagnosis not present

## 2020-12-28 DIAGNOSIS — K8033 Calculus of bile duct with acute cholangitis with obstruction: Secondary | ICD-10-CM

## 2020-12-28 DIAGNOSIS — D631 Anemia in chronic kidney disease: Secondary | ICD-10-CM | POA: Diagnosis present

## 2020-12-28 DIAGNOSIS — R933 Abnormal findings on diagnostic imaging of other parts of digestive tract: Secondary | ICD-10-CM

## 2020-12-28 DIAGNOSIS — E782 Mixed hyperlipidemia: Secondary | ICD-10-CM | POA: Diagnosis present

## 2020-12-28 DIAGNOSIS — Z79899 Other long term (current) drug therapy: Secondary | ICD-10-CM

## 2020-12-28 DIAGNOSIS — D696 Thrombocytopenia, unspecified: Secondary | ICD-10-CM | POA: Diagnosis not present

## 2020-12-28 DIAGNOSIS — G40A09 Absence epileptic syndrome, not intractable, without status epilepticus: Secondary | ICD-10-CM | POA: Diagnosis present

## 2020-12-28 DIAGNOSIS — E1122 Type 2 diabetes mellitus with diabetic chronic kidney disease: Secondary | ICD-10-CM | POA: Diagnosis present

## 2020-12-28 DIAGNOSIS — R0902 Hypoxemia: Secondary | ICD-10-CM

## 2020-12-28 DIAGNOSIS — G4733 Obstructive sleep apnea (adult) (pediatric): Secondary | ICD-10-CM | POA: Diagnosis present

## 2020-12-28 DIAGNOSIS — R109 Unspecified abdominal pain: Secondary | ICD-10-CM

## 2020-12-28 DIAGNOSIS — Z86718 Personal history of other venous thrombosis and embolism: Secondary | ICD-10-CM

## 2020-12-28 DIAGNOSIS — N179 Acute kidney failure, unspecified: Secondary | ICD-10-CM | POA: Diagnosis present

## 2020-12-28 LAB — COMPREHENSIVE METABOLIC PANEL
ALT: 24 U/L (ref 0–44)
AST: 54 U/L — ABNORMAL HIGH (ref 15–41)
Albumin: 2 g/dL — ABNORMAL LOW (ref 3.5–5.0)
Alkaline Phosphatase: 267 U/L — ABNORMAL HIGH (ref 38–126)
Anion gap: 15 (ref 5–15)
BUN: 32 mg/dL — ABNORMAL HIGH (ref 8–23)
CO2: 27 mmol/L (ref 22–32)
Calcium: 9.2 mg/dL (ref 8.9–10.3)
Chloride: 89 mmol/L — ABNORMAL LOW (ref 98–111)
Creatinine, Ser: 1.92 mg/dL — ABNORMAL HIGH (ref 0.61–1.24)
GFR, Estimated: 38 mL/min — ABNORMAL LOW (ref >60)
Glucose, Bld: 248 mg/dL — ABNORMAL HIGH (ref 70–99)
Potassium: 3.8 mmol/L (ref 3.5–5.1)
Sodium: 131 mmol/L — ABNORMAL LOW (ref 135–145)
Total Bilirubin: 5.2 mg/dL — ABNORMAL HIGH (ref 0.3–1.2)
Total Protein: 7.6 g/dL (ref 6.5–8.1)

## 2020-12-28 LAB — CBC WITH DIFFERENTIAL/PLATELET
Abs Immature Granulocytes: 0.18 10*3/uL — ABNORMAL HIGH (ref 0.00–0.07)
Basophils Absolute: 0 10*3/uL (ref 0.0–0.1)
Basophils Relative: 0 %
Eosinophils Absolute: 0 10*3/uL (ref 0.0–0.5)
Eosinophils Relative: 0 %
HCT: 38.6 % — ABNORMAL LOW (ref 39.0–52.0)
Hemoglobin: 11.9 g/dL — ABNORMAL LOW (ref 13.0–17.0)
Immature Granulocytes: 1 %
Lymphocytes Relative: 1 %
Lymphs Abs: 0.2 10*3/uL — ABNORMAL LOW (ref 0.7–4.0)
MCH: 26.6 pg (ref 26.0–34.0)
MCHC: 30.8 g/dL (ref 30.0–36.0)
MCV: 86.2 fL (ref 80.0–100.0)
Monocytes Absolute: 2.3 10*3/uL — ABNORMAL HIGH (ref 0.1–1.0)
Monocytes Relative: 11 %
Neutro Abs: 17.7 10*3/uL — ABNORMAL HIGH (ref 1.7–7.7)
Neutrophils Relative %: 87 %
Platelets: 198 10*3/uL (ref 150–400)
RBC: 4.48 MIL/uL (ref 4.22–5.81)
RDW: 15.8 % — ABNORMAL HIGH (ref 11.5–15.5)
WBC: 20.4 10*3/uL — ABNORMAL HIGH (ref 4.0–10.5)
nRBC: 0 % (ref 0.0–0.2)

## 2020-12-28 LAB — RESP PANEL BY RT-PCR (FLU A&B, COVID) ARPGX2
Influenza A by PCR: NEGATIVE
Influenza B by PCR: NEGATIVE
SARS Coronavirus 2 by RT PCR: NEGATIVE

## 2020-12-28 LAB — TSH: TSH: 1.844 u[IU]/mL (ref 0.350–4.500)

## 2020-12-28 LAB — LACTIC ACID, PLASMA
Lactic Acid, Venous: 2.2 mmol/L (ref 0.5–1.9)
Lactic Acid, Venous: 2.5 mmol/L (ref 0.5–1.9)

## 2020-12-28 LAB — AMMONIA: Ammonia: 16 umol/L (ref 9–35)

## 2020-12-28 LAB — PROTIME-INR
INR: 1.3 — ABNORMAL HIGH (ref 0.8–1.2)
Prothrombin Time: 15.8 seconds — ABNORMAL HIGH (ref 11.4–15.2)

## 2020-12-28 MED ORDER — ACETAMINOPHEN 325 MG PO TABS: 325.0000 mg | ORAL_TABLET | Freq: Once | ORAL | Status: DC

## 2020-12-28 MED ORDER — ACETAMINOPHEN 650 MG RE SUPP
650.0000 mg | Freq: Once | RECTAL | Status: AC
  Administered 2020-12-29: 650 mg via RECTAL
  Filled 2020-12-28: qty 1

## 2020-12-28 MED ORDER — VANCOMYCIN HCL 10 G IV SOLR
2500.0000 mg | Freq: Once | INTRAVENOUS | Status: AC
  Administered 2020-12-28: 2500 mg via INTRAVENOUS
  Filled 2020-12-28: qty 500

## 2020-12-28 MED ORDER — VANCOMYCIN HCL 1250 MG/250ML IV SOLN
1250.0000 mg | INTRAVENOUS | Status: DC
  Filled 2020-12-28: qty 250

## 2020-12-28 MED ORDER — PIPERACILLIN-TAZOBACTAM 3.375 G IVPB 30 MIN
3.3750 g | Freq: Once | INTRAVENOUS | Status: AC
  Administered 2020-12-28: 3.375 g via INTRAVENOUS
  Filled 2020-12-28: qty 50

## 2020-12-28 MED ORDER — IOHEXOL 300 MG/ML  SOLN
100.0000 mL | Freq: Once | INTRAMUSCULAR | Status: AC | PRN
  Administered 2020-12-28: 100 mL via INTRAVENOUS

## 2020-12-28 MED ORDER — PIPERACILLIN-TAZOBACTAM 3.375 G IVPB: 3.3750 g | Freq: Three times a day (TID) | INTRAVENOUS | Status: DC

## 2020-12-28 NOTE — Sepsis Progress Note (Signed)
Following for sepsis monitoring ?

## 2020-12-28 NOTE — ED Triage Notes (Signed)
Pt from [facility] in [place] for [place]. EMS reports pt hypoxic at 84% on room air, there was an oxygen concentrator in pt's room but staff unable to report to EMS if pt was on oxygen at baseline. Per family, pt is able to get into a recliner with assistance and speak. On arrival, pt alert, not following commands and appears jaundiced. EMS also report pt c/o abd pain with tenderness on palpation to RUQ.   EMS VS CBG 278, bp 140/76, pulse 108, sats improved to 98% on 4L [place]. 20g IV to L [facility]

## 2020-12-28 NOTE — ED Notes (Signed)
Lethargic, able to answer some questions appropriately, will not follow commands, noted bilat cushon boots on bilat feet, noted discoloration to bilat lower legs

## 2020-12-28 NOTE — ED Notes (Signed)
Returned from ct at this time ?

## 2020-12-28 NOTE — ED Provider Notes (Signed)
[facility] DE[facility] Provider Note   CSN: [id number] Arrival date & time: 12/28/20  2114     History Chief Complaint  Patient presents with   Altered Mental Status    Zeyad Gutierrez is a 65 y.o. male with medical history of CKD, A. fib with RVR, chronic respiratory failure, COPD, history of stroke, seizures that is presenting from [facility] skilled nursing facility due to patient being hypoxic to 84% on room air.  At this time unsure if patient requires oxygen at baseline.  Per family patient is able to get into recliner with assistance and have conversations at his baseline.  Patient is alert, however not following commands.  Patient is able to say his name but no following commands. Patient has tenderness to palpation of his right upper quadrant.   Patient's sister, Marya Fossa, stated that he has had 1 week of confusion, The patient used to wear oxygen, however, he has stopped wearing oxygen for the last several months.  Altered Mental Status Presenting symptoms: behavior changes, confusion and disorientation   Presenting symptoms: no combativeness, no memory loss and no unresponsiveness   Context: dementia and nursing home resident   Context: not alcohol use, not drug use, not head injury, not homeless, not recent illness and not recent infection   Associated symptoms: abdominal pain   Associated symptoms: normal movement, no agitation, no bladder incontinence, no decreased appetite, no depression, no difficulty breathing, no eye deviation, no fever, no hallucinations, no headaches, no light-headedness, no nausea, no palpitations, no rash, no seizures, no slurred speech, no suicidal behavior, no visual change, no vomiting and no weakness       Past Medical History:  Diagnosis Date   Absence epileptic syndrome, not intractable, without status epilepticus ([place]) 07/23/2019   Acute renal failure superimposed on stage 3 chronic kidney disease ([place]) 07/23/2019    Atherosclerotic heart disease 07/23/2019   Atrial fibrillation with RVR ([place]) 12/14/2019   Benign enlargement of prostate 07/23/2019   Body mass index 50.0-59.9, adult (HCC) 07/23/2019   Cellulitis of leg 12/14/2019   Chronic respiratory failure with hypoxia (HCC) 07/23/2019   Chronic venous hypertension (idiopathic) with ulcer of left lower extremity ([place]) 07/23/2019   COPD (chronic obstructive pulmonary disease) ([place]) 07/23/2019   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/15/2019   Diabetic neuropathy ([place]) 07/23/2019   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[place]) 10/15/2019   Epilepsy, grand mal ([place])    Essential hypertension 01/22/2018   Frozen shoulder    Right Shoulder   Gout attack 10/06/2019   H/O acute myocardial infarction 01/22/2018   H/O: stroke 01/22/2018   History of DVT (deep vein thrombosis) 01/22/2018   Incontinence without sensory awareness 07/23/2019   Long term (current) use of anticoagulants 07/23/2019   Lumbago    Lymphedema of both lower extremities 01/22/2018   Mixed hyperlipidemia 07/23/2019   Morbid obesity ([place]) 07/23/2019   Obstructive sleep apnea 07/23/2019   Paroxysmal atrial fibrillation ([place]) 01/07/2020   Peripheral vascular disease (HCC)    Peripheral vascular disease, unspecified ([place]) 07/12/2011   Secondary hypercoagulable state ([place]) 01/07/2020   Shoulder bursitis 07/25/2019   Stroke ([place])    Urinary incontinence 07/25/2019    Patient Active Problem List   Diagnosis Date Noted   Acute cholecystitis 12/29/2020   Sepsis ([place]) 12/29/2020   Seizure disorder ([place]) 12/29/2020   Encephalopathy 12/29/2020   Schizoaffective disorder ([place]) 03/14/2020   Impaired ambulation 03/14/2020   Epilepsy, grand mal ([place])    Frozen shoulder    Lumbago  Peripheral vascular disease ([place])    Stroke ([place])    Paroxysmal atrial fibrillation ([place]) 01/07/2020   Secondary hypercoagulable state ([place]) 01/07/2020   Cellulitis of leg 12/14/2019   Atrial fibrillation with RVR ([place]) 12/14/2019   Diabetic  glomerulopathy ([place]) 10/15/2019   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[place]) 10/15/2019   Gout attack 10/06/2019   Urinary incontinence 07/25/2019   Shoulder bursitis 07/25/2019   Acute renal failure superimposed on stage 3 chronic kidney disease ([place]) 07/23/2019   Mixed hyperlipidemia 07/23/2019   Absence epileptic syndrome, not intractable, without status epilepticus ([place]) 07/23/2019   Atherosclerotic heart disease 07/23/2019   Diabetic neuropathy ([place]) 07/23/2019   Benign enlargement of prostate 07/23/2019   COPD (chronic obstructive pulmonary disease) ([place]) 07/23/2019   Long term (current) use of anticoagulants 07/23/2019   Morbid obesity ([place]) 07/23/2019   Chronic venous hypertension (idiopathic) with ulcer of left lower extremity ([place]) 07/23/2019   Obstructive sleep apnea 07/23/2019   Incontinence without sensory awareness 07/23/2019   Chronic respiratory failure ([place]) 07/23/2019   Body mass index 50.0-59.9, adult ([place]) 07/23/2019   H/O acute myocardial infarction 01/22/2018   Essential hypertension 01/22/2018   H/O: stroke 01/22/2018   History of DVT (deep vein thrombosis) 01/22/2018   Lymphedema of both lower extremities 01/22/2018   Peripheral vascular disease, unspecified ([place]) 07/12/2011    Past Surgical History:  Procedure Laterality Date   CORONARY STENT PLACEMENT         Family History  Problem Relation Age of Onset   Cancer Mother    Stroke Father    Hypertension Other    Stroke Other    Hyperlipidemia Other    Alcohol abuse Other    Heart disease Other    Cancer Other     Social History   Tobacco Use   Smoking status: Former    Types: Cigarettes    Quit date: 10/28/2009    Years since quitting: 11.1   Smokeless tobacco: Never  Substance Use Topics   Alcohol use: Not Currently    Comment: occassionally   Drug use: No    Home Medications Prior to Admission medications   Medication Sig Start Date End Date Taking? Authorizing Provider  albuterol (PROVENTIL)  (2.5 MG/3ML) 0.083% nebulizer solution Inhale 3 mLs (2.5 mg total) into the lungs every 6 (six) hours as needed for wheezing or shortness of breath. 03/18/20   Lillard Anes, MD  allopurinol (ZYLOPRIM) 300 MG tablet Take 1 tablet (300 mg total) by mouth daily. 03/18/20   Lillard Anes, MD  carvedilol (COREG) 3.125 MG tablet Take 1 tablet (3.125 mg total) by mouth 2 (two) times daily with a meal. 02/05/20   Lillard Anes, MD  diltiazem (CARDIZEM) 60 MG tablet Take 1 tablet (60 mg total) by mouth 2 (two) times daily. 02/05/20   Lillard Anes, MD  EPINEPHrine 0.3 mg/0.3 mL IJ SOAJ injection Inject 0.3 mg into the muscle as needed for anaphylaxis. 03/18/20   Lillard Anes, MD  fluticasone (FLONASE) 50 MCG/ACT nasal spray Place 1 spray into both nostrils daily. 10/30/19   Lillard Anes, MD  gabapentin (NEURONTIN) 300 MG capsule Take 1 capsule (300 mg total) by mouth at bedtime. 11/18/19   Lillard Anes, MD  glucose blood test strip 1 each by Other route as needed. Use as instructed 03/17/20   Lillard Anes, MD  isosorbide mononitrate (IMDUR) 120 MG 24 hr tablet Take 1 tablet (120 mg total) by mouth daily. 01/16/20   Henrene Pastor,  Zeb Comfort, MD  metFORMIN (GLUCOPHAGE) 1000 MG tablet Take 1,000 mg by mouth 2 (two) times daily with a meal.    [provider]  phenytoin (DILANTIN) 300 MG ER capsule Take 1 capsule (300 mg total) by mouth 2 (two) times daily. Patient currently on 100 mg 2 times a day 01/16/20   Lillard Anes, MD  polyethylene glycol powder [facility]) 17 GM/SCOOP powder Take 1 Container by mouth once.    [provider]  pravastatin (PRAVACHOL) 40 MG tablet Take 1 tablet (40 mg total) by mouth daily. 11/18/19   Lillard Anes, MD  torsemide (DEMADEX) 20 MG tablet Take 20 mg by mouth 2 (two) times daily.    [provider]  TOVIAZ 4 MG TB24 tablet TAKE 1 TABLET EVERY DAY 03/25/20   Lillard Anes, MD  triamcinolone cream (KENALOG) 0.1 % APPLY TO THE AFFECTED AREA(S) TWICE DAILY AS NEEDED 11/16/19   Lillard Anes, MD  warfarin (COUMADIN) 2 MG tablet Take 1 tablet (2 mg total) by mouth 3 (three) times a week. Monday, Wednesday and Friday 03/18/20   Lillard Anes, MD  warfarin (COUMADIN) 4 MG tablet Take 4 mg by mouth daily. Saturday, Sunday,tuesday and thursday    [provider]    Allergies    Codeine  Review of Systems   Review of Systems  Constitutional:  Negative for chills, decreased appetite, diaphoresis, fatigue and fever.  HENT:  Negative for congestion, ear pain and sore throat.   Eyes:  Negative for pain and visual disturbance.  Respiratory:  Negative for cough and shortness of breath.   Cardiovascular:  Negative for chest pain and palpitations.  Gastrointestinal:  Positive for abdominal pain. Negative for diarrhea, nausea and vomiting.  Genitourinary:  Negative for bladder incontinence, dysuria and hematuria.  Musculoskeletal:  Negative for arthralgias and back pain.  Skin:  Negative for color change and rash.  Neurological:  Negative for seizures, syncope, weakness, light-headedness and headaches.  Psychiatric/Behavioral:  Positive for confusion. Negative for agitation, hallucinations, memory loss, sleep disturbance and suicidal ideas.   All other systems reviewed and are negative.  Physical Exam Updated Vital Signs BP 118/75   Pulse 95   Temp 98.3 F (36.8 C) (Oral)   Resp 20   Ht 6' (1.829 m)   Wt 130.6 kg   SpO2 97%   BMI 39.05 kg/m   Physical Exam Vitals and nursing note reviewed.  Constitutional:      General: He is not in acute distress.    Appearance: He is well-developed.  HENT:     Head: Normocephalic and atraumatic.     Right Ear: External ear normal.     Left Ear: External ear normal.     Nose: Nose normal. No congestion.     Mouth/Throat:     Mouth: Mucous membranes are moist.  Eyes:      Extraocular Movements: Extraocular movements intact.     Conjunctiva/sclera: Conjunctivae normal.     Pupils: Pupils are equal, round, and reactive to light.  Cardiovascular:     Rate and Rhythm: Normal rate and regular rhythm.     Pulses: Normal pulses.     Heart sounds: No murmur heard. Pulmonary:     Effort: Pulmonary effort is normal. No respiratory distress.     Breath sounds: Normal breath sounds. No wheezing or rales.  Abdominal:     Palpations: Abdomen is soft.     Tenderness: There is abdominal tenderness in the  right upper quadrant. There is guarding. There is no rebound.  Musculoskeletal:     Cervical back: Normal range of motion and neck supple.  Skin:    General: Skin is warm and dry.  Neurological:     Mental Status: He is alert. He is disoriented.     Cranial Nerves: No cranial nerve deficit.     Sensory: No sensory deficit.     Motor: No weakness.  Psychiatric:        Mood and Affect: Mood normal.        Behavior: Behavior normal.        Thought Content: Thought content normal.        Judgment: Judgment normal.    ED Results / Procedures / Treatments   Labs (all labs ordered are listed, but only abnormal results are displayed) Labs Reviewed  COMPREHENSIVE METABOLIC PANEL - Abnormal; Notable for the following components:      Result Value   Sodium 131 (*)    Chloride 89 (*)    Glucose, Bld 248 (*)    BUN 32 (*)    Creatinine, Ser 1.92 (*)    Albumin 2.0 (*)    AST 54 (*)    Alkaline Phosphatase 267 (*)    Total Bilirubin 5.2 (*)    GFR, Estimated 38 (*)    All other components within normal limits  LACTIC ACID, PLASMA - Abnormal; Notable for the following components:   Lactic Acid, Venous 2.2 (*)    All other components within normal limits  LACTIC ACID, PLASMA - Abnormal; Notable for the following components:   Lactic Acid, Venous 2.5 (*)    All other components within normal limits  CBC WITH DIFFERENTIAL/PLATELET - Abnormal; Notable for the  following components:   WBC 20.4 (*)    Hemoglobin 11.9 (*)    HCT 38.6 (*)    RDW 15.8 (*)    Neutro Abs 17.7 (*)    Lymphs Abs 0.2 (*)    Monocytes Absolute 2.3 (*)    Abs Immature Granulocytes 0.18 (*)    All other components within normal limits  PROTIME-INR - Abnormal; Notable for the following components:   Prothrombin Time 15.8 (*)    INR 1.3 (*)    All other components within normal limits  RESP PANEL BY RT-PCR (FLU A&B, COVID) ARPGX2  CULTURE, BLOOD (ROUTINE X 2)  CULTURE, BLOOD (ROUTINE X 2)  AMMONIA  TSH  HEPATITIS PANEL, ACUTE  URINALYSIS, ROUTINE W REFLEX MICROSCOPIC    EKG EKG Interpretation  Date/Time:  Monday December 28 2020 21:29:11 EDT Ventricular Rate:  103 PR Interval:  132 QRS Duration: 102 QT Interval:  336 QTC Calculation: 440 R Axis:   -36 Text Interpretation: Sinus tachycardia Left axis deviation Abnormal R-wave progression, late transition Nonspecific T abnormalities, lateral leads when compared to prior, faster rate. No STEMI Confirmed by Antony Blackbird [phone]) on 12/28/2020 9:44:05 PM  Radiology CT HEAD WO CONTRAST (5MM)  Result Date: 12/28/2020 CLINICAL DATA:  Mental status change, unknown cause altered mental status. EXAM: CT HEAD WITHOUT CONTRAST TECHNIQUE: Contiguous axial images were obtained from the base of the skull through the vertex without intravenous contrast. COMPARISON:  None. FINDINGS: Brain: Normal anatomic configuration. There is moderate parenchymal volume loss, slightly advanced given the patient's age. Remote lacunar infarcts are noted within the left caudate nucleus, left insular cortex, and right thalamus. Periventricular white matter changes are present likely reflecting the sequela of small vessel ischemia. No abnormal intra or  extra-axial mass lesion or fluid collection. No abnormal mass effect or midline shift. No evidence of acute intracranial hemorrhage or infarct. Ventricular size is normal. Cerebellum unremarkable. Vascular:  No asymmetric hyperdense vasculature at the skull base. Skull: Intact Sinuses/Orbits: Paranasal sinuses are clear. Orbits are unremarkable. Other: Mastoid air cells and middle ear cavities are clear. IMPRESSION: No acute intracranial abnormality. Advanced senescent change. Multiple remote infarcts as noted above. Electronically Signed   By: Fidela Salisbury MD   On: 12/28/2020 23:45   CT ABDOMEN PELVIS W CONTRAST  Result Date: 12/28/2020 CLINICAL DATA:  Abdominal pain, biliary obstruction suspected (Ped 0-18y) EXAM: CT ABDOMEN AND PELVIS WITH CONTRAST TECHNIQUE: Multidetector CT imaging of the abdomen and pelvis was performed using the standard protocol following bolus administration of intravenous contrast. CONTRAST:  178m OMNIPAQUE IOHEXOL 300 MG/ML  SOLN COMPARISON:  04/11/2020 FINDINGS: Lower chest: Small right pleural effusion noted at the visualized right lung base with right basilar atelectasis. Mild left basilar atelectasis. Extensive multi-vessel coronary artery calcification with probable stenting of the left anterior descending coronary artery. Cardiac size is mildly enlarged. No pericardial effusion. Hepatobiliary: The gallbladder is distended, the gallbladder wall is thickened, and there is extensive pericholecystic inflammatory change in keeping with changes of acute cholecystitis. The extrahepatic bile duct is not dilated. There is no intrahepatic biliary ductal dilation. The liver is unremarkable. Pancreas: Unremarkable Spleen: Unremarkable Adrenals/Urinary Tract: Adrenal glands are unremarkable. Kidneys are normal, without renal calculi, focal lesion, or hydronephrosis. Bladder is unremarkable. Stomach/Bowel: The stomach, small bowel, and large bowel are unremarkable. The appendix is not visualized and is likely absent. No free intraperitoneal gas or fluid. Vascular/Lymphatic: Mild aortoiliac atherosclerotic calcification. Advanced atherosclerotic calcification is seen within the visualized lower  extremity arterial outflow. No aortic aneurysm. No pathologic adenopathy within the abdomen and pelvis. Reproductive: Prostate is unremarkable. Other: Tiny fat containing umbilical hernia.  Rectum unremarkable. Musculoskeletal: No acute bone abnormality. No lytic or blastic bone lesions are seen. IMPRESSION: Acute cholecystitis.  Surgical consultation is advised. Extensive coronary artery calcification.  Mild global cardiomegaly. Aortic Atherosclerosis (ICD10-I70.0). Electronically Signed   By: AFidela SalisburyMD   On: 12/28/2020 23:50   DG Chest Portable 1 View  Result Date: 12/28/2020 CLINICAL DATA:  Tachycardia. EXAM: PORTABLE CHEST 1 VIEW COMPARISON:  04/11/2020 FINDINGS: Low lung volumes persist. Stable heart size, likely accentuated by technique and low lung volumes. Unchanged mediastinal contours. Streaky bibasilar atelectasis or scarring. No pulmonary edema, pleural effusion or pneumothorax. Patient's chin partially obscures the right lung apex. No acute osseous abnormalities are seen. IMPRESSION: Low lung volumes with streaky bibasilar atelectasis or scarring. Electronically Signed   By: MKeith RakeM.D.   On: 12/28/2020 22:58   [place]Abdomen Limited RUQ (LIVER/GB)  Result Date: 12/28/2020 CLINICAL DATA:  Abdominal pain. EXAM: ULTRASOUND ABDOMEN LIMITED RIGHT UPPER QUADRANT COMPARISON:  CT abdomen pelvis 04/11/2020 FINDINGS: Gallbladder: Abnormal gallbladder markedly limited evaluation - question of a heterogeneous structure distending the gallbladder. Common bile duct: Diameter: 4 mm Liver: Markedly limited evaluation. Unable to evaluate for focal hepatic lesion. Normal parenchymal echogenicity. Portal vein is patent on color Doppler imaging with normal direction of blood flow towards the liver. Other: None. IMPRESSION: 1. Abnormal gallbladder with markedly limited/nondiagnostic evaluation - question of a heterogeneous structure distending the gallbladder. Recommend CT abdomen pelvis with  intravenous contrast for further evaluation. 2. Markedly limited evaluation of the liver due to decreased penetration of the sonographic waves. Electronically Signed   By: MIven FinnM.D.   On:  12/28/2020 22:45    Procedures Procedures   Medications Ordered in ED Medications  vancomycin (VANCOCIN) 2,500 mg in sodium chloride 0.9 % 500 mL IVPB (2,500 mg Intravenous New Bag/Given 12/28/20 2344)  piperacillin-tazobactam (ZOSYN) IVPB 3.375 g (0 g Intravenous Stopped 12/28/20 2319)    Followed by  piperacillin-tazobactam (ZOSYN) IVPB 3.375 g (has no administration in time range)  vancomycin (VANCOREADY) IVPB 1250 mg/250 mL (has no administration in time range)  lactated ringers infusion ( Intravenous New Bag/Given 12/29/20 0050)  iohexol (OMNIPAQUE) 300 MG/ML solution 100 mL (100 mLs Intravenous Contrast Given 12/28/20 2339)  acetaminophen (TYLENOL) suppository 650 mg (650 mg Rectal Given 12/29/20 0016)  sodium chloride 0.9 % bolus 1,000 mL (1,000 mLs Intravenous New Bag/Given 12/29/20 0050)    ED Course  I have reviewed the triage vital signs and the nursing notes.  Pertinent labs & imaging results that were available during my care of the patient were reviewed by me and considered in my medical decision making (see chart for details).    MDM Rules/Calculators/A&P                         Critical care time - 45 minutes  Patient is a 65 year old male with a past medical history of CKD, A. fib with RVR, chronic respiratory failure, COPD, history of stroke, seizures that is presenting from [facility] skilled nursing facility due to patient being hypoxic to 84% on room air and altered mental stauts. Patient is on 4L nasal cannula. Patient is oriented to self only. He was tachycardic, tachypnic, and febrile on arrival. He was found to have a leukocytosis of 20.4, elevated lactic acid of 2.5. His CMP was notable for an elevated alk phos, creatinine, BUN. Code sepsis activated. Blood cultures  performed.  Pharmacy was consulted for IV antibiotics. [place] of right upper quadrant showed abnormal gallbladder.  CXR showed no pna. Due to concern for altered mental status TSH and ammonia performed that are within normal limits. Patient's sister states that he is not at his baseline and has a new oxygen requirement. CT head showed no acute intracranial abnormality and CT abd/pelvis showed acute cholecystitis.   General surgery was consulted for acute cholecystitis and recommending medicine admission.   Patient will be admitted to hospitalist service.   Final Clinical Impression(s) / ED Diagnoses Final diagnoses:  Abdominal pain  Altered mental status, unspecified altered mental status type  Cholecystitis  Sepsis, due to unspecified organism, unspecified whether acute organ dysfunction present [facility])    Rx / DC Orders ED Discharge Orders     None        Doretha Sou, MD 12/29/20 0132    Tegeler, Gwenyth Allegra, MD 12/29/20 1141

## 2020-12-28 NOTE — ED Notes (Signed)
Received verbal report from [place] at this time

## 2020-12-28 NOTE — ED Notes (Signed)
US at bedside

## 2020-12-28 NOTE — Progress Notes (Signed)
Pharmacy Antibiotic Note  [facility] Gut[facility]rez is a 65 y.o. male admitted on 12/28/2020 with sepsis.  Pharmacy has been consulted for vancomycin and zosyn dosing.  Plan: Vancomycin '2500mg'$  x1 then '1250mg'$  IV q24h (eAUC 492, Cr 1.'92mg'$ /dL   Zosyn 3.375g IV q8h -Monitor renal function, clinical status, and antibiotic plan  Height: 6' (182.9 cm) Weight: 130.6 kg (287 lb 14.7 oz) IBW/kg (Calculated) : 77.6  Temp (24hrs), Avg:101.4 F (38.6 C), Min:101.4 F (38.6 C), Max:101.4 F (38.6 C)  Recent Labs  Lab 12/28/20 2141 12/28/20 2145  WBC 20.4*  --   CREATININE 1.92*  --   LATICACIDVEN  --  2.2*    Estimated Creatinine Clearance: 52.9 mL/min (A) (by C-G formula based on SCr of 1.92 mg/dL (H)).    Allergies  Allergen Reactions   Codeine     Antimicrobials this admission: Vanc 8/1 >>  Zosyn 8/1 >>   Dose adjustments this admission: N/A  Microbiology results: 8/1 BCx:   Thank you for allowing pharmacy to be a part of this patient's care.  Joetta Manners, PharmD, [facility] Emergency Medicine Clinical Pharmacist ED RPh Phone: [place]: [phone]

## 2020-12-28 NOTE — ED Notes (Signed)
[facility] Gutierrez pt sister would like an update

## 2020-12-29 ENCOUNTER — Inpatient Hospital Stay (HOSPITAL_COMMUNITY): Payer: No Typology Code available for payment source

## 2020-12-29 ENCOUNTER — Encounter (HOSPITAL_COMMUNITY): Payer: Self-pay | Admitting: Family Medicine

## 2020-12-29 DIAGNOSIS — E1165 Type 2 diabetes mellitus with hyperglycemia: Secondary | ICD-10-CM | POA: Diagnosis present

## 2020-12-29 DIAGNOSIS — K8050 Calculus of bile duct without cholangitis or cholecystitis without obstruction: Secondary | ICD-10-CM | POA: Diagnosis not present

## 2020-12-29 DIAGNOSIS — N1832 Chronic kidney disease, stage 3b: Secondary | ICD-10-CM | POA: Diagnosis present

## 2020-12-29 DIAGNOSIS — I5022 Chronic systolic (congestive) heart failure: Secondary | ICD-10-CM | POA: Diagnosis not present

## 2020-12-29 DIAGNOSIS — G40A09 Absence epileptic syndrome, not intractable, without status epilepticus: Secondary | ICD-10-CM | POA: Diagnosis present

## 2020-12-29 DIAGNOSIS — Z20822 Contact with and (suspected) exposure to covid-19: Secondary | ICD-10-CM | POA: Diagnosis present

## 2020-12-29 DIAGNOSIS — E1122 Type 2 diabetes mellitus with diabetic chronic kidney disease: Secondary | ICD-10-CM | POA: Diagnosis present

## 2020-12-29 DIAGNOSIS — N390 Urinary tract infection, site not specified: Secondary | ICD-10-CM | POA: Diagnosis present

## 2020-12-29 DIAGNOSIS — K8062 Calculus of gallbladder and bile duct with acute cholecystitis without obstruction: Secondary | ICD-10-CM | POA: Diagnosis present

## 2020-12-29 DIAGNOSIS — E872 Acidosis: Secondary | ICD-10-CM | POA: Diagnosis present

## 2020-12-29 DIAGNOSIS — K810 Acute cholecystitis: Secondary | ICD-10-CM | POA: Diagnosis present

## 2020-12-29 DIAGNOSIS — E1142 Type 2 diabetes mellitus with diabetic polyneuropathy: Secondary | ICD-10-CM | POA: Diagnosis present

## 2020-12-29 DIAGNOSIS — I5032 Chronic diastolic (congestive) heart failure: Secondary | ICD-10-CM | POA: Diagnosis present

## 2020-12-29 DIAGNOSIS — G9340 Encephalopathy, unspecified: Secondary | ICD-10-CM | POA: Diagnosis not present

## 2020-12-29 DIAGNOSIS — J9622 Acute and chronic respiratory failure with hypercapnia: Secondary | ICD-10-CM | POA: Diagnosis present

## 2020-12-29 DIAGNOSIS — D631 Anemia in chronic kidney disease: Secondary | ICD-10-CM | POA: Diagnosis present

## 2020-12-29 DIAGNOSIS — I10 Essential (primary) hypertension: Secondary | ICD-10-CM | POA: Diagnosis not present

## 2020-12-29 DIAGNOSIS — J690 Pneumonitis due to inhalation of food and vomit: Secondary | ICD-10-CM | POA: Diagnosis not present

## 2020-12-29 DIAGNOSIS — G928 Other toxic encephalopathy: Secondary | ICD-10-CM | POA: Diagnosis present

## 2020-12-29 DIAGNOSIS — A419 Sepsis, unspecified organism: Secondary | ICD-10-CM

## 2020-12-29 DIAGNOSIS — Z7901 Long term (current) use of anticoagulants: Secondary | ICD-10-CM | POA: Diagnosis not present

## 2020-12-29 DIAGNOSIS — J9601 Acute respiratory failure with hypoxia: Secondary | ICD-10-CM | POA: Diagnosis not present

## 2020-12-29 DIAGNOSIS — E1151 Type 2 diabetes mellitus with diabetic peripheral angiopathy without gangrene: Secondary | ICD-10-CM | POA: Diagnosis present

## 2020-12-29 DIAGNOSIS — K8033 Calculus of bile duct with acute cholangitis with obstruction: Secondary | ICD-10-CM | POA: Diagnosis not present

## 2020-12-29 DIAGNOSIS — I480 Paroxysmal atrial fibrillation: Secondary | ICD-10-CM | POA: Diagnosis not present

## 2020-12-29 DIAGNOSIS — E785 Hyperlipidemia, unspecified: Secondary | ICD-10-CM | POA: Diagnosis not present

## 2020-12-29 DIAGNOSIS — E871 Hypo-osmolality and hyponatremia: Secondary | ICD-10-CM | POA: Diagnosis present

## 2020-12-29 DIAGNOSIS — F0390 Unspecified dementia without behavioral disturbance: Secondary | ICD-10-CM | POA: Diagnosis present

## 2020-12-29 DIAGNOSIS — J440 Chronic obstructive pulmonary disease with acute lower respiratory infection: Secondary | ICD-10-CM | POA: Diagnosis present

## 2020-12-29 DIAGNOSIS — I482 Chronic atrial fibrillation, unspecified: Secondary | ICD-10-CM | POA: Diagnosis present

## 2020-12-29 DIAGNOSIS — I130 Hypertensive heart and chronic kidney disease with heart failure and stage 1 through stage 4 chronic kidney disease, or unspecified chronic kidney disease: Secondary | ICD-10-CM | POA: Diagnosis present

## 2020-12-29 DIAGNOSIS — A4159 Other Gram-negative sepsis: Secondary | ICD-10-CM | POA: Diagnosis present

## 2020-12-29 DIAGNOSIS — N179 Acute kidney failure, unspecified: Secondary | ICD-10-CM | POA: Diagnosis not present

## 2020-12-29 DIAGNOSIS — R609 Edema, unspecified: Secondary | ICD-10-CM | POA: Diagnosis not present

## 2020-12-29 DIAGNOSIS — G40909 Epilepsy, unspecified, not intractable, without status epilepticus: Secondary | ICD-10-CM

## 2020-12-29 DIAGNOSIS — Z6839 Body mass index (BMI) 39.0-39.9, adult: Secondary | ICD-10-CM | POA: Diagnosis not present

## 2020-12-29 DIAGNOSIS — J449 Chronic obstructive pulmonary disease, unspecified: Secondary | ICD-10-CM | POA: Diagnosis not present

## 2020-12-29 HISTORY — PX: IR PERC CHOLECYSTOSTOMY: IMG2326

## 2020-12-29 LAB — CBC
HCT: 35.5 % — ABNORMAL LOW (ref 39.0–52.0)
Hemoglobin: 11.3 g/dL — ABNORMAL LOW (ref 13.0–17.0)
MCH: 27.4 pg (ref 26.0–34.0)
MCHC: 31.8 g/dL (ref 30.0–36.0)
MCV: 86 fL (ref 80.0–100.0)
Platelets: 165 10*3/uL (ref 150–400)
RBC: 4.13 MIL/uL — ABNORMAL LOW (ref 4.22–5.81)
RDW: 15.9 % — ABNORMAL HIGH (ref 11.5–15.5)
WBC: 16.7 10*3/uL — ABNORMAL HIGH (ref 4.0–10.5)
nRBC: 0 % (ref 0.0–0.2)

## 2020-12-29 LAB — HEPATIC FUNCTION PANEL
ALT: 23 U/L (ref 0–44)
AST: 48 U/L — ABNORMAL HIGH (ref 15–41)
Albumin: 1.7 g/dL — ABNORMAL LOW (ref 3.5–5.0)
Alkaline Phosphatase: 244 U/L — ABNORMAL HIGH (ref 38–126)
Bilirubin, Direct: 2.8 mg/dL — ABNORMAL HIGH (ref 0.0–0.2)
Indirect Bilirubin: 1.8 mg/dL — ABNORMAL HIGH (ref 0.3–0.9)
Total Bilirubin: 4.6 mg/dL — ABNORMAL HIGH (ref 0.3–1.2)
Total Protein: 6.7 g/dL (ref 6.5–8.1)

## 2020-12-29 LAB — URINALYSIS, ROUTINE W REFLEX MICROSCOPIC
Glucose, UA: 150 mg/dL — AB
Ketones, ur: 5 mg/dL — AB
Nitrite: POSITIVE — AB
Protein, ur: 100 mg/dL — AB
Specific Gravity, Urine: 1.033 — ABNORMAL HIGH (ref 1.005–1.030)
WBC, UA: >50 WBC/hpf — ABNORMAL HIGH (ref 0–5)
pH: 5 (ref 5.0–8.0)

## 2020-12-29 LAB — BLOOD CULTURE ID PANEL (REFLEXED) - BCID2
A.calcoaceticus-baumannii: NOT DETECTED
A.calcoaceticus-baumannii: NOT DETECTED
Bacteroides fragilis: NOT DETECTED
Bacteroides fragilis: NOT DETECTED
CTX-M ESBL: NOT DETECTED
Candida albicans: NOT DETECTED
Candida albicans: NOT DETECTED
Candida auris: NOT DETECTED
Candida auris: NOT DETECTED
Candida glabrata: NOT DETECTED
Candida glabrata: NOT DETECTED
Candida krusei: NOT DETECTED
Candida krusei: NOT DETECTED
Candida parapsilosis: NOT DETECTED
Candida parapsilosis: NOT DETECTED
Candida tropicalis: NOT DETECTED
Candida tropicalis: NOT DETECTED
Carbapenem resist OXA 48 LIKE: NOT DETECTED
Carbapenem resistance IMP: NOT DETECTED
Carbapenem resistance KPC: NOT DETECTED
Carbapenem resistance NDM: NOT DETECTED
Carbapenem resistance VIM: NOT DETECTED
Cryptococcus neoformans/gattii: NOT DETECTED
Cryptococcus neoformans/gattii: NOT DETECTED
Enterobacter cloacae complex: NOT DETECTED
Enterobacter cloacae complex: NOT DETECTED
Enterobacterales: DETECTED — AB
Enterobacterales: NOT DETECTED
Enterococcus Faecium: NOT DETECTED
Enterococcus Faecium: NOT DETECTED
Enterococcus faecalis: NOT DETECTED
Enterococcus faecalis: NOT DETECTED
Escherichia coli: NOT DETECTED
Escherichia coli: NOT DETECTED
Haemophilus influenzae: NOT DETECTED
Haemophilus influenzae: NOT DETECTED
Klebsiella aerogenes: NOT DETECTED
Klebsiella aerogenes: NOT DETECTED
Klebsiella oxytoca: NOT DETECTED
Klebsiella oxytoca: NOT DETECTED
Klebsiella pneumoniae: DETECTED — AB
Klebsiella pneumoniae: NOT DETECTED
Listeria monocytogenes: NOT DETECTED
Listeria monocytogenes: NOT DETECTED
Neisseria meningitidis: NOT DETECTED
Neisseria meningitidis: NOT DETECTED
Proteus species: NOT DETECTED
Proteus species: NOT DETECTED
Pseudomonas aeruginosa: NOT DETECTED
Pseudomonas aeruginosa: NOT DETECTED
Salmonella species: NOT DETECTED
Salmonella species: NOT DETECTED
Serratia marcescens: NOT DETECTED
Serratia marcescens: NOT DETECTED
Staphylococcus aureus (BCID): NOT DETECTED
Staphylococcus aureus (BCID): NOT DETECTED
Staphylococcus epidermidis: NOT DETECTED
Staphylococcus epidermidis: NOT DETECTED
Staphylococcus lugdunensis: NOT DETECTED
Staphylococcus lugdunensis: NOT DETECTED
Staphylococcus species: NOT DETECTED
Staphylococcus species: DETECTED — AB
Stenotrophomonas maltophilia: NOT DETECTED
Stenotrophomonas maltophilia: NOT DETECTED
Streptococcus agalactiae: NOT DETECTED
Streptococcus agalactiae: NOT DETECTED
Streptococcus pneumoniae: NOT DETECTED
Streptococcus pneumoniae: NOT DETECTED
Streptococcus pyogenes: NOT DETECTED
Streptococcus pyogenes: NOT DETECTED
Streptococcus species: NOT DETECTED
Streptococcus species: NOT DETECTED

## 2020-12-29 LAB — CBG MONITORING, ED: Glucose-Capillary: 191 mg/dL — ABNORMAL HIGH (ref 70–99)

## 2020-12-29 LAB — HEMOGLOBIN A1C
Hgb A1c MFr Bld: 8.6 % — ABNORMAL HIGH (ref 4.8–5.6)
Mean Plasma Glucose: 200.12 mg/dL

## 2020-12-29 LAB — BASIC METABOLIC PANEL
Anion gap: 11 (ref 5–15)
BUN: 28 mg/dL — ABNORMAL HIGH (ref 8–23)
CO2: 30 mmol/L (ref 22–32)
Calcium: 8.8 mg/dL — ABNORMAL LOW (ref 8.9–10.3)
Chloride: 94 mmol/L — ABNORMAL LOW (ref 98–111)
Creatinine, Ser: 1.77 mg/dL — ABNORMAL HIGH (ref 0.61–1.24)
GFR, Estimated: 42 mL/min — ABNORMAL LOW (ref >60)
Glucose, Bld: 207 mg/dL — ABNORMAL HIGH (ref 70–99)
Potassium: 3.5 mmol/L (ref 3.5–5.1)
Sodium: 135 mmol/L (ref 135–145)

## 2020-12-29 LAB — HIV ANTIBODY (ROUTINE TESTING W REFLEX): HIV Screen 4th Generation wRfx: NONREACTIVE

## 2020-12-29 LAB — GLUCOSE, CAPILLARY
Glucose-Capillary: 157 mg/dL — ABNORMAL HIGH (ref 70–99)
Glucose-Capillary: 171 mg/dL — ABNORMAL HIGH (ref 70–99)

## 2020-12-29 LAB — PROTIME-INR
INR: 1.3 — ABNORMAL HIGH (ref 0.8–1.2)
Prothrombin Time: 16.1 seconds — ABNORMAL HIGH (ref 11.4–15.2)

## 2020-12-29 LAB — HEPATITIS PANEL, ACUTE
HCV Ab: NONREACTIVE
Hep A IgM: NONREACTIVE
Hep B C IgM: NONREACTIVE
Hepatitis B Surface Ag: NONREACTIVE

## 2020-12-29 LAB — LACTIC ACID, PLASMA: Lactic Acid, Venous: 1.4 mmol/L (ref 0.5–1.9)

## 2020-12-29 MED ORDER — GABAPENTIN 300 MG PO CAPS
300.0000 mg | ORAL_CAPSULE | Freq: Every day | ORAL | Status: DC
  Administered 2021-01-01 – 2021-01-07 (×7): 300 mg via ORAL
  Filled 2020-12-29 (×7): qty 1

## 2020-12-29 MED ORDER — LIDOCAINE HCL 1 % IJ SOLN
INTRAMUSCULAR | Status: AC
  Filled 2020-12-29: qty 20

## 2020-12-29 MED ORDER — SODIUM CHLORIDE 0.9 % IV SOLN
2.0000 g | Freq: Two times a day (BID) | INTRAVENOUS | Status: DC
  Administered 2020-12-29: 2 g via INTRAVENOUS
  Filled 2020-12-29: qty 2

## 2020-12-29 MED ORDER — ONDANSETRON HCL 4 MG PO TABS: 4.0000 mg | ORAL_TABLET | Freq: Four times a day (QID) | ORAL | Status: DC | PRN

## 2020-12-29 MED ORDER — WARFARIN SODIUM 4 MG PO TABS: 4.0000 mg | ORAL_TABLET | Freq: Every day | ORAL | Status: DC

## 2020-12-29 MED ORDER — ACETAMINOPHEN 325 MG PO TABS
650.0000 mg | ORAL_TABLET | Freq: Four times a day (QID) | ORAL | Status: DC | PRN
  Administered 2021-01-02: 650 mg via ORAL
  Filled 2020-12-29: qty 2

## 2020-12-29 MED ORDER — FENTANYL CITRATE (PF) 100 MCG/2ML IJ SOLN
INTRAMUSCULAR | Status: AC
  Filled 2020-12-29: qty 2

## 2020-12-29 MED ORDER — LIDOCAINE HCL (PF) 1 % IJ SOLN
INTRAMUSCULAR | Status: AC | PRN
  Administered 2020-12-29: 5 mL

## 2020-12-29 MED ORDER — HYDROMORPHONE HCL 1 MG/ML IJ SOLN
1.0000 mg | INTRAMUSCULAR | Status: DC | PRN
  Filled 2020-12-29: qty 1

## 2020-12-29 MED ORDER — VALPROATE SODIUM 100 MG/ML IV SOLN
750.0000 mg | Freq: Two times a day (BID) | INTRAVENOUS | Status: DC
  Filled 2020-12-29 (×2): qty 7.5

## 2020-12-29 MED ORDER — WARFARIN - PHARMACIST DOSING INPATIENT: Freq: Every day | Status: DC

## 2020-12-29 MED ORDER — MIDAZOLAM HCL 2 MG/2ML IJ SOLN
INTRAMUSCULAR | Status: AC | PRN
  Administered 2020-12-29: 0.5 mg via INTRAVENOUS
  Administered 2020-12-29: 1 mg via INTRAVENOUS

## 2020-12-29 MED ORDER — METRONIDAZOLE 500 MG/100ML IV SOLN
500.0000 mg | Freq: Three times a day (TID) | INTRAVENOUS | Status: DC
  Administered 2020-12-29 (×3): 500 mg via INTRAVENOUS
  Filled 2020-12-29 (×3): qty 100

## 2020-12-29 MED ORDER — WARFARIN SODIUM 5 MG PO TABS: 5.0000 mg | ORAL_TABLET | Freq: Once | ORAL | Status: DC

## 2020-12-29 MED ORDER — INSULIN ASPART 100 UNIT/ML IJ SOLN
0.0000 [IU] | Freq: Four times a day (QID) | INTRAMUSCULAR | Status: DC | PRN
  Administered 2020-12-29: 2 [IU] via SUBCUTANEOUS

## 2020-12-29 MED ORDER — SODIUM CHLORIDE 0.9% FLUSH
5.0000 mL | Freq: Two times a day (BID) | INTRAVENOUS | Status: DC
  Administered 2020-12-29 – 2021-01-07 (×18): 5 mL

## 2020-12-29 MED ORDER — PHENYTOIN SODIUM EXTENDED 100 MG PO CAPS: 300.0000 mg | ORAL_CAPSULE | Freq: Two times a day (BID) | ORAL | Status: DC

## 2020-12-29 MED ORDER — ONDANSETRON HCL 4 MG/2ML IJ SOLN
4.0000 mg | Freq: Four times a day (QID) | INTRAMUSCULAR | Status: DC | PRN
  Administered 2021-01-05: 4 mg via INTRAVENOUS
  Filled 2020-12-29: qty 2

## 2020-12-29 MED ORDER — SODIUM CHLORIDE 0.9 % IV SOLN
2.0000 g | INTRAVENOUS | Status: DC
  Administered 2020-12-29: 2 g via INTRAVENOUS
  Filled 2020-12-29: qty 20
  Filled 2020-12-29: qty 2
  Filled 2020-12-29: qty 20

## 2020-12-29 MED ORDER — ISOSORBIDE MONONITRATE ER 30 MG PO TB24: 120.0000 mg | ORAL_TABLET | Freq: Every day | ORAL | Status: DC

## 2020-12-29 MED ORDER — VALPROATE SODIUM 100 MG/ML IV SOLN
625.0000 mg | Freq: Two times a day (BID) | INTRAVENOUS | Status: DC
  Administered 2020-12-29 – 2020-12-31 (×4): 625 mg via INTRAVENOUS
  Filled 2020-12-29 (×6): qty 6.25

## 2020-12-29 MED ORDER — HEPARIN SODIUM (PORCINE) 5000 UNIT/ML IJ SOLN: 5000.0000 [IU] | Freq: Three times a day (TID) | INTRAMUSCULAR | Status: DC

## 2020-12-29 MED ORDER — HEPARIN (PORCINE) 25000 UT/250ML-% IV SOLN
1400.0000 [IU]/h | INTRAVENOUS | Status: DC
  Administered 2020-12-29: 1400 [IU]/h via INTRAVENOUS
  Filled 2020-12-29: qty 250

## 2020-12-29 MED ORDER — HEPARIN (PORCINE) 25000 UT/250ML-% IV SOLN: 1400.0000 [IU]/h | INTRAVENOUS | Status: DC

## 2020-12-29 MED ORDER — IOHEXOL 300 MG/ML  SOLN
50.0000 mL | Freq: Once | INTRAMUSCULAR | Status: AC | PRN
  Administered 2020-12-29: 15 mL

## 2020-12-29 MED ORDER — ALBUTEROL SULFATE (2.5 MG/3ML) 0.083% IN NEBU
2.5000 mg | INHALATION_SOLUTION | RESPIRATORY_TRACT | Status: DC | PRN
  Administered 2020-12-31: 2.5 mg via RESPIRATORY_TRACT
  Filled 2020-12-29: qty 3

## 2020-12-29 MED ORDER — DILTIAZEM HCL 60 MG PO TABS
60.0000 mg | ORAL_TABLET | Freq: Two times a day (BID) | ORAL | Status: DC
  Filled 2020-12-29 (×2): qty 1

## 2020-12-29 MED ORDER — VANCOMYCIN HCL 1250 MG/250ML IV SOLN
1250.0000 mg | INTRAVENOUS | Status: DC
  Filled 2020-12-29: qty 250

## 2020-12-29 MED ORDER — HEPARIN (PORCINE) 25000 UT/250ML-% IV SOLN
1600.0000 [IU]/h | INTRAVENOUS | Status: AC
  Administered 2020-12-29: 1400 [IU]/h via INTRAVENOUS
  Administered 2020-12-30: 1600 [IU]/h via INTRAVENOUS
  Filled 2020-12-29: qty 250

## 2020-12-29 MED ORDER — ACETAMINOPHEN 650 MG RE SUPP
650.0000 mg | Freq: Four times a day (QID) | RECTAL | Status: DC | PRN
  Administered 2020-12-29 – 2020-12-30 (×2): 650 mg via RECTAL
  Filled 2020-12-29 (×2): qty 1

## 2020-12-29 MED ORDER — CARVEDILOL 3.125 MG PO TABS: 3.1250 mg | ORAL_TABLET | Freq: Two times a day (BID) | ORAL | Status: DC

## 2020-12-29 MED ORDER — PRAVASTATIN SODIUM 40 MG PO TABS: 40.0000 mg | ORAL_TABLET | Freq: Every day | ORAL | Status: DC

## 2020-12-29 MED ORDER — FENTANYL CITRATE (PF) 100 MCG/2ML IJ SOLN
INTRAMUSCULAR | Status: AC | PRN
  Administered 2020-12-29 (×3): 25 ug via INTRAVENOUS

## 2020-12-29 MED ORDER — SODIUM CHLORIDE 0.9 % IV SOLN: INTRAVENOUS | Status: DC

## 2020-12-29 MED ORDER — SODIUM CHLORIDE 0.9 % IV BOLUS
1000.0000 mL | Freq: Once | INTRAVENOUS | Status: AC
  Administered 2020-12-29: 1000 mL via INTRAVENOUS

## 2020-12-29 MED ORDER — LACTATED RINGERS IV SOLN: INTRAVENOUS | Status: DC

## 2020-12-29 MED ORDER — WARFARIN SODIUM 2 MG PO TABS: 2.0000 mg | ORAL_TABLET | ORAL | Status: DC

## 2020-12-29 MED ORDER — MIDAZOLAM HCL 2 MG/2ML IJ SOLN
INTRAMUSCULAR | Status: AC
  Filled 2020-12-29: qty 4

## 2020-12-29 NOTE — Progress Notes (Signed)
PHARMACY - PHYSICIAN COMMUNICATION CRITICAL VALUE ALERT - BLOOD CULTURE IDENTIFICATION (BCID)  Matthew Gutierrez is an 65 y.o. male who presented to [facility] on 12/28/2020 with a chief complaint of AMS/hypoxia  Assessment: 65 yo M with Klebsiella pneumonia bacteremia from IAI source, found to have acute cholecystitis. MRCP done this afternoon as well.  Name of physician (or Provider) Contacted: Dr. Starla Link  Current antibiotics: Vancomycin, cefepime, and flagyl  Changes to prescribed antibiotics recommended:  Discontinue above antibiotics and start ceftriaxone 2g IV q24h as no ESBL detected.   Results for orders placed or performed during the hospital encounter of 12/28/20  Blood Culture ID Panel (Reflexed) (Collected: 12/28/2020  9:45 PM)  Result Value Ref Range   Enterococcus faecalis NOT DETECTED NOT DETECTED   Enterococcus Faecium NOT DETECTED NOT DETECTED   Listeria monocytogenes NOT DETECTED NOT DETECTED   Staphylococcus species NOT DETECTED NOT DETECTED   Staphylococcus aureus (BCID) NOT DETECTED NOT DETECTED   Staphylococcus epidermidis NOT DETECTED NOT DETECTED   Staphylococcus lugdunensis NOT DETECTED NOT DETECTED   Streptococcus species NOT DETECTED NOT DETECTED   Streptococcus agalactiae NOT DETECTED NOT DETECTED   Streptococcus pneumoniae NOT DETECTED NOT DETECTED   Streptococcus pyogenes NOT DETECTED NOT DETECTED   A.calcoaceticus-baumannii NOT DETECTED NOT DETECTED   Bacteroides fragilis NOT DETECTED NOT DETECTED   Enterobacterales DETECTED (A) NOT DETECTED   Enterobacter cloacae complex NOT DETECTED NOT DETECTED   Escherichia coli NOT DETECTED NOT DETECTED   Klebsiella aerogenes NOT DETECTED NOT DETECTED   Klebsiella oxytoca NOT DETECTED NOT DETECTED   Klebsiella pneumoniae DETECTED (A) NOT DETECTED   Proteus species NOT DETECTED NOT DETECTED   Salmonella species NOT DETECTED NOT DETECTED   Serratia marcescens NOT DETECTED NOT DETECTED   Haemophilus influenzae NOT  DETECTED NOT DETECTED   Neisseria meningitidis NOT DETECTED NOT DETECTED   Pseudomonas aeruginosa NOT DETECTED NOT DETECTED   Stenotrophomonas maltophilia NOT DETECTED NOT DETECTED   Candida albicans NOT DETECTED NOT DETECTED   Candida auris NOT DETECTED NOT DETECTED   Candida glabrata NOT DETECTED NOT DETECTED   Candida krusei NOT DETECTED NOT DETECTED   Candida parapsilosis NOT DETECTED NOT DETECTED   Candida tropicalis NOT DETECTED NOT DETECTED   Cryptococcus neoformans/gattii NOT DETECTED NOT DETECTED   CTX-M ESBL NOT DETECTED NOT DETECTED   Carbapenem resistance IMP NOT DETECTED NOT DETECTED   Carbapenem resistance KPC NOT DETECTED NOT DETECTED   Carbapenem resistance NDM NOT DETECTED NOT DETECTED   Carbapenem resist OXA 48 LIKE NOT DETECTED NOT DETECTED   Carbapenem resistance VIM NOT DETECTED NOT DETECTED   Joetta Manners, PharmD, [facility] Clinical Pharmacist ED RPh Phone: [phone] Main RX: [phone]

## 2020-12-29 NOTE — Progress Notes (Signed)
Pharmacy Antibiotic Note  Matthew Gutierrez is a 65 y.o. male admitted on 12/28/2020 with sepsis and ?intra-abdominal infection .  Pharmacy has been consulted for Cefepime dosing. Already on vancomycin. WBC elevated. Noted renal dysfunction.  Plan: Already on vancomycin DC Zosyn Start Cefepime 2g IV q12h Flagyl per MD Trend WBC, temp, renal function  F/U infectious work-up Drug levels as indicated   Height: 6' (182.9 cm) Weight: 130.6 kg (287 lb 14.7 oz) IBW/kg (Calculated) : 77.6  Temp (24hrs), Avg:99.9 F (37.7 C), Min:98.3 F (36.8 C), Max:101.4 F (38.6 C)  Recent Labs  Lab 12/28/20 2141 12/28/20 2145 12/28/20 2230  WBC 20.4*  --   --   CREATININE 1.92*  --   --   LATICACIDVEN  --  2.2* 2.5*    Estimated Creatinine Clearance: 52.9 mL/min (A) (by C-G formula based on SCr of 1.92 mg/dL (H)).    Allergies  Allergen Reactions   Codeine     Narda Bonds, PharmD, BCPS Clinical Pharmacist Phone: [phone]

## 2020-12-29 NOTE — ED Notes (Signed)
Surgical provider at bedside.

## 2020-12-29 NOTE — ED Notes (Signed)
Received a call back from provider. Made aware of pt current status at this time

## 2020-12-29 NOTE — Progress Notes (Signed)
Pt arrived to unit from ED, Pt ST on monitor  A/O x 1 to self,  CCMD called ,CHG given, pt oriented to unit, condom cath placed and charted. Pt has drain on Right flank. Will continue to monitor.   Albin Felling Prudencio Velazco, RN    12/29/20 1821  Vitals  Temp (!) 101.2 F (38.4 C)  Temp Source Oral  BP (!) 154/79  MAP (mmHg) 102  BP Location Right Arm  BP Method Automatic  Patient Position (if appropriate) Lying  Pulse Rate (!) 102  Pulse Rate Source Monitor  ECG Heart Rate (!) 102  Resp (!) 22  Level of Consciousness  Level of Consciousness Alert  Oxygen Therapy  SpO2 100 %  O2 Device Nasal Cannula  O2 Flow Rate (L/min) 2 L/min  Pain Assessment  Pain Scale (Procedural Areas Only) Assessment interferes with procedure  Pain Score 0  Glasgow Coma Scale  Eye Opening 3  Best Verbal Response (NON-intubated) 4  Best Motor Response 4  Glasgow Coma Scale Score 11  MEWS Score  MEWS Temp 1  MEWS Systolic 0  MEWS Pulse 1  MEWS RR 1  MEWS LOC 0  MEWS Score 3  MEWS Score Color Yellow

## 2020-12-29 NOTE — Sepsis Progress Note (Signed)
Judicious use of IVF d/t CKD III. Will receive gentle hydration overnight.

## 2020-12-29 NOTE — H&P (Signed)
History and Physical    Chapin Arduini GYI:[id number] DOB: Nov 03, 1955 DOA: 12/28/2020  PCP: Lillard Anes, MD   Patient coming from: SNF  Chief Complaint: Altered mental status, hypoxia  HPI: Matthew Gutierrez is a 65 y.o. male with medical history significant for DMT2, CKD 3, a-fib, COPD, neuropathy due to diabetes, history of stroke, OSA, PVD who presents by EMS from skilled nursing facility for evaluation of altered mental status with hypoxia.  Reportedly patient is normally conversational at his baseline.  In the evening of December 28, 2020 patient was found to be hypoxic with oxygen saturations in the mid 80s on room air and had altered mental status and minimally say his name but not interact in any other way.  He was very slow to respond which is not normal for him.  There was no report of any vomiting or diarrhea.  She is unable to provide any history.  There is no staff from the facility present  ED Course: The emergency room patient is found to be febrile with a temperature of 101.4 degrees.  He is also found to be septic with elevated lactic acid, encephalopathy, tachycardia, tachypnea and imaging revealed acute cholecystitis.  He has elevated bilirubin level.  Surgery was consulted and recommended MRCP in light of his clinical situation.  If MRCP is positive they recommended GI consult in the morning.  Lab work revealed sodium of 131, potassium 3.8, chloride 89, bicarb 27, glucose 248, creatinine 1.92 elevated from baseline of 1.5, alk phos AST 67, AST 54, ALT 24, bilirubin 5.2, lactic acid 2.2 with repeat lactic acid of 2.5.  WBC is 20,400, hemoglobin 11.9, hematocrit 30.6, platelet 190,000.  TSH of 1.844.  Appreciate surgery's evaluation and recommendations and assistance with patient care.  Patient is admitted to the hospitalist service for further management  Review of Systems:  Not assessed review of systems secondary to encephalopathy  Past Medical History:  Diagnosis Date    Absence epileptic syndrome, not intractable, without status epilepticus ([place]) 07/23/2019   Acute renal failure superimposed on stage 3 chronic kidney disease ([place]) 07/23/2019   Atherosclerotic heart disease 07/23/2019   Atrial fibrillation with RVR ([place]) 12/14/2019   Benign enlargement of prostate 07/23/2019   Body mass index 50.0-59.9, adult ([place]) 07/23/2019   Cellulitis of leg 12/14/2019   Chronic respiratory failure with hypoxia ([place]) 07/23/2019   Chronic venous hypertension (idiopathic) with ulcer of left lower extremity ([place]) 07/23/2019   COPD (chronic obstructive pulmonary disease) ([place]) 07/23/2019   Diabetic glomerulopathy ([place]) 10/15/2019   Diabetic neuropathy ([place]) 07/23/2019   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[place]) 10/15/2019   Epilepsy, grand mal ([place])    Essential hypertension 01/22/2018   Frozen shoulder    Right Shoulder   Gout attack 10/06/2019   H/O acute myocardial infarction 01/22/2018   H/O: stroke 01/22/2018   History of DVT (deep vein thrombosis) 01/22/2018   Incontinence without sensory awareness 07/23/2019   Long term (current) use of anticoagulants 07/23/2019   Lumbago    Lymphedema of both lower extremities 01/22/2018   Mixed hyperlipidemia 07/23/2019   Morbid obesity ([place]) 07/23/2019   Obstructive sleep apnea 07/23/2019   Paroxysmal atrial fibrillation ([place]) 01/07/2020   Peripheral vascular disease ([place])    Peripheral vascular disease, unspecified ([place]) 07/12/2011   Secondary hypercoagulable state ([place]) 01/07/2020   Shoulder bursitis 07/25/2019   Stroke ([place])    Urinary incontinence 07/25/2019    Past Surgical History:  Procedure Laterality Date   CORONARY STENT PLACEMENT  Social History  reports that he quit smoking about 11 years ago. He has never used smokeless tobacco. He reports previous alcohol use. He reports that he does not use drugs.  Allergies  Allergen Reactions   Codeine     Family History  Problem Relation Age of Onset   Cancer Mother    Stroke Father     Hypertension Other    Stroke Other    Hyperlipidemia Other    Alcohol abuse Other    Heart disease Other    Cancer Other      Prior to Admission medications   Medication Sig Start Date End Date Taking? Authorizing Provider  albuterol (PROVENTIL) (2.5 MG/3ML) 0.083% nebulizer solution Inhale 3 mLs (2.5 mg total) into the lungs every 6 (six) hours as needed for wheezing or shortness of breath. 03/18/20   Lillard Anes, MD  allopurinol (ZYLOPRIM) 300 MG tablet Take 1 tablet (300 mg total) by mouth daily. 03/18/20   Lillard Anes, MD  carvedilol (COREG) 3.125 MG tablet Take 1 tablet (3.125 mg total) by mouth 2 (two) times daily with a meal. 02/05/20   Lillard Anes, MD  diltiazem (CARDIZEM) 60 MG tablet Take 1 tablet (60 mg total) by mouth 2 (two) times daily. 02/05/20   Lillard Anes, MD  EPINEPHrine 0.3 mg/0.3 mL IJ SOAJ injection Inject 0.3 mg into the muscle as needed for anaphylaxis. 03/18/20   Lillard Anes, MD  fluticasone (FLONASE) 50 MCG/ACT nasal spray Place 1 spray into both nostrils daily. 10/30/19   Lillard Anes, MD  gabapentin (NEURONTIN) 300 MG capsule Take 1 capsule (300 mg total) by mouth at bedtime. 11/18/19   Lillard Anes, MD  glucose blood test strip 1 each by Other route as needed. Use as instructed 03/17/20   Lillard Anes, MD  isosorbide mononitrate (IMDUR) 120 MG 24 hr tablet Take 1 tablet (120 mg total) by mouth daily. 01/16/20   Lillard Anes, MD  metFORMIN (GLUCOPHAGE) 1000 MG tablet Take 1,000 mg by mouth 2 (two) times daily with a meal.    [provider]  phenytoin (DILANTIN) 300 MG ER capsule Take 1 capsule (300 mg total) by mouth 2 (two) times daily. Patient currently on 100 mg 2 times a day 01/16/20   Lillard Anes, MD  polyethylene glycol powder [facility]) 17 GM/SCOOP powder Take 1 Container by mouth once.    [provider]  pravastatin (PRAVACHOL) 40 MG tablet  Take 1 tablet (40 mg total) by mouth daily. 11/18/19   Lillard Anes, MD  torsemide (DEMADEX) 20 MG tablet Take 20 mg by mouth 2 (two) times daily.    [provider]  TOVIAZ 4 MG TB24 tablet TAKE 1 TABLET EVERY DAY 03/25/20   Lillard Anes, MD  triamcinolone cream (KENALOG) 0.1 % APPLY TO THE AFFECTED AREA(S) TWICE DAILY AS NEEDED 11/16/19   Lillard Anes, MD  warfarin (COUMADIN) 2 MG tablet Take 1 tablet (2 mg total) by mouth 3 (three) times a week. Monday, Wednesday and Friday 03/18/20   Lillard Anes, MD  warfarin (COUMADIN) 4 MG tablet Take 4 mg by mouth daily. Saturday, Sunday,tuesday and thursday    [provider]    Physical Exam: Vitals:   12/28/20 2345 12/28/20 2345 12/29/20 0015 12/29/20 0045  BP: 132/74  128/78 118/75  Pulse: (!) 102  96 95  Resp: (!) _0 Temp:  98.3 F (36.8 C)  TempSrc:  Oral    SpO2: 100%  98% 97%  Weight:      Height:        Constitutional: NAD, calm, comfortable Vitals:   12/28/20 2345 12/28/20 2345 12/29/20 0015 12/29/20 0045  BP: 132/74  128/78 118/75  Pulse: (!) 102  96 95  Resp: (!) _0 Temp:  98.3 F (36.8 C)    TempSrc:  Oral    SpO2: 100%  98% 97%  Weight:      Height:       General: WDWN, lethargic.  Opens eyes to tactile stimulation but does not verbally respond Eyes:  PERRL,conjunctivae normal.  Sclera icteric HENT:  [place]/AT, external ears normal.  Nares patent without epistasis.  Mucous membranes are dry  Neck:  soft, supple, no masses,  Trachea midline Respiratory: Diminished shallow breath sounds with mild diffuse rales. No wheezing, no crackles. Normal respiratory effort. No accessory muscle use.  Cardiovascular: Regular rate and rhythm, no murmurs / rubs / gallops. +1 lower extremity edema. 1+ pedal pulses. Abdomen: Soft, no tenderness.  No masses palpated. Morbidly obese.Bowel sounds normoactive Musculoskeletal:  normal passive ROM. no  cyanosis. No joint  deformity upper and lower extremities. Normal muscle tone.  Skin: Warm, dry, intact, lesions, ulcers.  Taken skin of lower extremities with chronic venous stasis changes.  No induration Neurologic: patella DTR +1 bilaterally.   Labs on Admission: I have personally reviewed following labs and imaging studies  CBC: Recent Labs  Lab 12/28/20 2141  WBC 20.4*  NEUTROABS 17.7*  HGB 11.9*  HCT 38.6*  MCV 86.2  PLT 287    Basic Metabolic Panel: Recent Labs  Lab 12/28/20 2141  NA 131*  K 3.8  CL 89*  CO2 27  GLUCOSE 248*  BUN 32*  CREATININE 1.92*  CALCIUM 9.2    GFR: Estimated Creatinine Clearance: 52.9 mL/min (A) (by C-G formula based on SCr of 1.92 mg/dL (H)).  Liver Function Tests: Recent Labs  Lab 12/28/20 2141  AST 54*  ALT 24  ALKPHOS 267*  BILITOT 5.2*  PROT 7.6  ALBUMIN 2.0*    Urine analysis: No results found for: COLORURINE, APPEARANCEUR, LABSPEC, PHURINE, GLUCOSEU, HGBUR, BILIRUBINUR, KETONESUR, PROTEINUR, UROBILINOGEN, NITRITE, LEUKOCYTESUR  Radiological Exams on Admission: CT HEAD WO CONTRAST (5MM)  Result Date: 12/28/2020 CLINICAL DATA:  Mental status change, unknown cause altered mental status. EXAM: CT HEAD WITHOUT CONTRAST TECHNIQUE: Contiguous axial images were obtained from the base of the skull through the vertex without intravenous contrast. COMPARISON:  None. FINDINGS: Brain: Normal anatomic configuration. There is moderate parenchymal volume loss, slightly advanced given the patient's age. Remote lacunar infarcts are noted within the left caudate nucleus, left insular cortex, and right thalamus. Periventricular white matter changes are present likely reflecting the sequela of small vessel ischemia. No abnormal intra or extra-axial mass lesion or fluid collection. No abnormal mass effect or midline shift. No evidence of acute intracranial hemorrhage or infarct. Ventricular size is normal. Cerebellum unremarkable. Vascular: No asymmetric hyperdense  vasculature at the skull base. Skull: Intact Sinuses/Orbits: Paranasal sinuses are clear. Orbits are unremarkable. Other: Mastoid air cells and middle ear cavities are clear. IMPRESSION: No acute intracranial abnormality. Advanced senescent change. Multiple remote infarcts as noted above. Electronically Signed   By: Fidela Salisbury MD   On: 12/28/2020 23:45   CT ABDOMEN PELVIS W CONTRAST  Result Date: 12/28/2020 CLINICAL DATA:  Abdominal pain, biliary obstruction suspected (Ped 0-18y) EXAM: CT ABDOMEN AND PELVIS WITH CONTRAST TECHNIQUE:  Multidetector CT imaging of the abdomen and pelvis was performed using the standard protocol following bolus administration of intravenous contrast. CONTRAST:  138m OMNIPAQUE IOHEXOL 300 MG/ML  SOLN COMPARISON:  04/11/2020 FINDINGS: Lower chest: Small right pleural effusion noted at the visualized right lung base with right basilar atelectasis. Mild left basilar atelectasis. Extensive multi-vessel coronary artery calcification with probable stenting of the left anterior descending coronary artery. Cardiac size is mildly enlarged. No pericardial effusion. Hepatobiliary: The gallbladder is distended, the gallbladder wall is thickened, and there is extensive pericholecystic inflammatory change in keeping with changes of acute cholecystitis. The extrahepatic bile duct is not dilated. There is no intrahepatic biliary ductal dilation. The liver is unremarkable. Pancreas: Unremarkable Spleen: Unremarkable Adrenals/Urinary Tract: Adrenal glands are unremarkable. Kidneys are normal, without renal calculi, focal lesion, or hydronephrosis. Bladder is unremarkable. Stomach/Bowel: The stomach, small bowel, and large bowel are unremarkable. The appendix is not visualized and is likely absent. No free intraperitoneal gas or fluid. Vascular/Lymphatic: Mild aortoiliac atherosclerotic calcification. Advanced atherosclerotic calcification is seen within the visualized lower extremity arterial  outflow. No aortic aneurysm. No pathologic adenopathy within the abdomen and pelvis. Reproductive: Prostate is unremarkable. Other: Tiny fat containing umbilical hernia.  Rectum unremarkable. Musculoskeletal: No acute bone abnormality. No lytic or blastic bone lesions are seen. IMPRESSION: Acute cholecystitis.  Surgical consultation is advised. Extensive coronary artery calcification.  Mild global cardiomegaly. Aortic Atherosclerosis (ICD10-I70.0). Electronically Signed   By: AFidela SalisburyMD   On: 12/28/2020 23:50   DG Chest Portable 1 View  Result Date: 12/28/2020 CLINICAL DATA:  Tachycardia. EXAM: PORTABLE CHEST 1 VIEW COMPARISON:  04/11/2020 FINDINGS: Low lung volumes persist. Stable heart size, likely accentuated by technique and low lung volumes. Unchanged mediastinal contours. Streaky bibasilar atelectasis or scarring. No pulmonary edema, pleural effusion or pneumothorax. Patient's chin partially obscures the right lung apex. No acute osseous abnormalities are seen. IMPRESSION: Low lung volumes with streaky bibasilar atelectasis or scarring. Electronically Signed   By: MKeith RakeM.D.   On: 12/28/2020 22:58   UK[place]Abdomen Limited RUQ (LIVER/GB)  Result Date: 12/28/2020 CLINICAL DATA:  Abdominal pain. EXAM: ULTRASOUND ABDOMEN LIMITED RIGHT UPPER QUADRANT COMPARISON:  CT abdomen pelvis 04/11/2020 FINDINGS: Gallbladder: Abnormal gallbladder markedly limited evaluation - question of a heterogeneous structure distending the gallbladder. Common bile duct: Diameter: 4 mm Liver: Markedly limited evaluation. Unable to evaluate for focal hepatic lesion. Normal parenchymal echogenicity. Portal vein is patent on color Doppler imaging with normal direction of blood flow towards the liver. Other: None. IMPRESSION: 1. Abnormal gallbladder with markedly limited/nondiagnostic evaluation - question of a heterogeneous structure distending the gallbladder. Recommend CT abdomen pelvis with intravenous contrast for  further evaluation. 2. Markedly limited evaluation of the liver due to decreased penetration of the sonographic waves. Electronically Signed   By: MIven FinnM.D.   On: 12/28/2020 22:45    EKG: Independently reviewed.  EKG shows sinus tachycardia with nonspecific ST changes.  No acute ST elevation or depression.  QTc 440  Assessment/Plan Principal Problem:   Acute cholecystitis Mr. TLaredois admmitted to Progressive care floor.  He has sepsis with hyperbilirubinemia, acute cholecystitis.  Surgery has evaluated patient appreciate their assistance with patient care. MRCP is ordered and if is positive patient will need GI consult in the morning. Empiric antibiotic coverage with cefepime and Flagyl is provided.  Discussed antibiotic regimen with pharmacist cefepime and Flagyl was decided to be the best option.  He was given dose of vancomycin and Zosyn in the emergency  room.  Vancomycin and Zosyn will be continued secondary to the high risk of nephrotoxicity with a combination. IV fluid hydration with LR at 75 ml/hr overnight  Active Problems:   Sepsis  Patient meets sepsis criteria with cute cholecystitis with leukocytosis, tachycardia, tachypnea and altered mental status He was given IV fluid bolus in the emergency room    Essential hypertension Home medications of Coreg, Cardizem, Imdur will be continued.  Monitor blood pressure    Acute renal failure superimposed on stage 3 chronic kidney disease Creatinine and BUN are mildly elevated from baseline level.  IV fluid hydration provided overnight. Check electrolytes and renal function in morning with labs    COPD (chronic obstructive pulmonary disease)  Albuterol nebulizer as needed for shortness of breath.    Paroxysmal atrial fibrillation Chronic.  Is on warfarin.  INR is subtherapeutic at 1.3.  Pharmacy to monitor    Encephalopathy Secondary to sepsis    Obstructive sleep apnea CPAP at night    Seizure disorder  Continue  Neurontin and Depakote.  Patient placed on seizure precautions    H/O: stroke    Long term (current) use of anticoagulants     DVT prophylaxis: Pt is on Coumadin but INR is subtherapeutic.  Will bridge with heparin overnight.  Pharmacy to adjust Coumadin and once INR therapeutic can stop heparin Code Status:   Full code Family Communication:  No family or staff from SNF present Disposition Plan:   Patient is from:  SNF  Anticipated DC to:  SNF  Anticipated DC date:  Anticipate 2 midnight or more stay  Anticipated DC barriers: No barriers to discharge identified at this time  Consults called:  Surgery consulted by ER physician  Admission status:  Inpatient  Yevonne Aline Lawrence Roldan MD [facility]  How to contact the [facility] Attending or Consulting provider [place] or covering provider during after hours [place], for this patient?   Check the care team in [facility] and look for a) attending/consulting [facility] provider listed and b) the [facility] team listed Log into [email or web address] and use [place]'s universal password to access. If you do not have the password, please contact the hospital operator. Locate the [facility] provider you are looking for under [facility] and page to a number that you can be directly reached. If you still have difficulty reaching the provider, please page the [facility] (Director on Call) for the Hospitalists listed on amion for assistance.  12/29/2020, 1:23 AM

## 2020-12-29 NOTE — ED Notes (Signed)
o2sats in the 80's increase o2 to 6lpm via [place] at this time

## 2020-12-29 NOTE — Progress Notes (Signed)
[place] for Heparin (warfarin on hold) Indication: Afib, history of DVT  Allergies  Allergen Reactions   Codeine Other (See Comments)    Unknown Reaction    Patient Measurements: Height: 6' (182.9 cm) Weight: 130.6 kg (287 lb 14.7 oz) IBW/kg (Calculated) : 77.6  Vital Signs: BP: 131/77 (08/02 1130) Pulse Rate: 100 (08/02 1130)  Labs: Recent Labs    12/28/20 2141 12/29/20 0340  HGB 11.9* 11.3*  HCT 38.6* 35.5*  PLT 198 165  LABPROT 15.8* 16.1*  INR 1.3* 1.3*  CREATININE 1.92* 1.77*     Estimated Creatinine Clearance: 57.4 mL/min (A) (by C-G formula based on SCr of 1.77 mg/dL (H)).   Medical History: Past Medical History:  Diagnosis Date   Absence epileptic syndrome, not intractable, without status epilepticus ([place]) 07/23/2019   Acute renal failure superimposed on stage 3 chronic kidney disease ([place]) 07/23/2019   Atherosclerotic heart disease 07/23/2019   Atrial fibrillation with RVR ([place]) 12/14/2019   Benign enlargement of prostate 07/23/2019   Body mass index 50.0-59.9, adult ([place]) 07/23/2019   Cellulitis of leg 12/14/2019   Chronic respiratory failure with hypoxia ([place]) 07/23/2019   Chronic venous hypertension (idiopathic) with ulcer of left lower extremity ([place]) 07/23/2019   COPD (chronic obstructive pulmonary disease) ([place]) 07/23/2019   Diabetic glomerulopathy ([place]) 10/15/2019   Diabetic neuropathy ([place]) 07/23/2019   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[place]) 10/15/2019   Epilepsy, grand mal ([place])    Essential hypertension 01/22/2018   Frozen shoulder    Right Shoulder   Gout attack 10/06/2019   H/O acute myocardial infarction 01/22/2018   H/O: stroke 01/22/2018   History of DVT (deep vein thrombosis) 01/22/2018   Incontinence without sensory awareness 07/23/2019   Long term (current) use of anticoagulants 07/23/2019   Lumbago    Lymphedema of both lower extremities 01/22/2018   Mixed hyperlipidemia 07/23/2019   Morbid obesity ([place]) 07/23/2019    Obstructive sleep apnea 07/23/2019   Paroxysmal atrial fibrillation ([place]) 01/07/2020   Peripheral vascular disease ([place])    Peripheral vascular disease, unspecified ([place]) 07/12/2011   Secondary hypercoagulable state ([place]) 01/07/2020   Shoulder bursitis 07/25/2019   Stroke [facility])    Urinary incontinence 07/25/2019    Assessment: 65 y/o M from SNF with hypoxemia. Unable to follow commands in the ED. Having some abdominal pain. On warfarin PTA for history of DVT and afib- of note, Nursing MAR reported as not taking (unclear why). INR is low at 1.3. Hgb 11.9. Noted renal dysfunction.   Underwent cholecystostomy tube placement 8/2 - okay with IR to restart heparin 6 hr after procedure. No s/sx of bleeding.   Goal of Therapy:  Heparin level 0.3-0.7 units/mL Monitor platelets by anticoagulation protocol: Yes   Plan:  Restart heparin drip at 1400 units/hr on 8/2'@1800'$  Order heparin level 6 hours after restart F/u warfarin restart   Antonietta Jewel, PharmD, [place] Clinical Pharmacist  Phone: [phone] 12/29/2020 2:02 PM  Please check AMION for all [place] phone numbers After 10:00 PM, call [place] [phone]

## 2020-12-29 NOTE — ED Notes (Signed)
resp at bedside at this time

## 2020-12-29 NOTE — Progress Notes (Signed)
ANTICOAGULATION CONSULT NOTE - Initial Consult  Pharmacy Consult for Warfarin  Indication: Afib, history of DVT  Allergies  Allergen Reactions   Codeine     Patient Measurements: Height: 6' (182.9 cm) Weight: 130.6 kg (287 lb 14.7 oz) IBW/kg (Calculated) : 77.6  Vital Signs: Temp: 98.3 F (36.8 C) (08/01 2345) Temp Source: Oral (08/01 2345) BP: 118/75 (08/02 0045) Pulse Rate: 95 (08/02 0045)  Labs: Recent Labs    12/28/20 2141  HGB 11.9*  HCT 38.6*  PLT 198  LABPROT 15.8*  INR 1.3*  CREATININE 1.92*    Estimated Creatinine Clearance: 52.9 mL/min (A) (by C-G formula based on SCr of 1.92 mg/dL (H)).   Medical History: Past Medical History:  Diagnosis Date   Absence epileptic syndrome, not intractable, without status epilepticus ([place]) 07/23/2019   Acute renal failure superimposed on stage 3 chronic kidney disease ([place]) 07/23/2019   Atherosclerotic heart disease 07/23/2019   Atrial fibrillation with RVR ([place]) 12/14/2019   Benign enlargement of prostate 07/23/2019   Body mass index 50.0-59.9, adult ([place]) 07/23/2019   Cellulitis of leg 12/14/2019   Chronic respiratory failure with hypoxia ([place]) 07/23/2019   Chronic venous hypertension (idiopathic) with ulcer of left lower extremity ([place]) 07/23/2019   COPD (chronic obstructive pulmonary disease) ([place]) 07/23/2019   Diabetic glomerulopathy ([place]) 10/15/2019   Diabetic neuropathy ([place]) 07/23/2019   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[place]) 10/15/2019   Epilepsy, grand mal ([place])    Essential hypertension 01/22/2018   Frozen shoulder    Right Shoulder   Gout attack 10/06/2019   H/O acute myocardial infarction 01/22/2018   H/O: stroke 01/22/2018   History of DVT (deep vein thrombosis) 01/22/2018   Incontinence without sensory awareness 07/23/2019   Long term (current) use of anticoagulants 07/23/2019   Lumbago    Lymphedema of both lower extremities 01/22/2018   Mixed hyperlipidemia 07/23/2019   Morbid obesity ([place]) 07/23/2019   Obstructive sleep  apnea 07/23/2019   Paroxysmal atrial fibrillation ([place]) 01/07/2020   Peripheral vascular disease ([place])    Peripheral vascular disease, unspecified ([place]) 07/12/2011   Secondary hypercoagulable state ([place]) 01/07/2020   Shoulder bursitis 07/25/2019   Stroke [facility])    Urinary incontinence 07/25/2019    Assessment: 65 y/o M from SNF with hypoxemia. Unable to follow commands in the ED. Having some abdominal pain. On warfarin PTA for history of DVT and afib. INR is low at 1.3. Hgb 11.9. Noted renal dysfunction. Will need to f/u with [facility] from SNF for PTA dosing.   Goal of Therapy:  INR 2-3 Monitor platelets by anticoagulation protocol: Yes   Plan:  Warfarin 5 mg PO x 1 at 1600 today Daily PT/INR Monitor for bleeding F/U PTA dosing once we get MAR from SNF  Narda Bonds, PharmD, [place] Pharmacist Phone: [phone]

## 2020-12-29 NOTE — Progress Notes (Signed)
ANTICOAGULATION CONSULT NOTE - Initial Consult  Pharmacy Consult for Heparin (warfarin on hold) Indication: Afib, history of DVT  Allergies  Allergen Reactions   Codeine Other (See Comments)    Unknown Reaction    Patient Measurements: Height: 6' (182.9 cm) Weight: 130.6 kg (287 lb 14.7 oz) IBW/kg (Calculated) : 77.6  Vital Signs: Temp: 98.3 F (36.8 C) (08/01 2345) Temp Source: Oral (08/01 2345) BP: 155/74 (08/02 0315) Pulse Rate: 99 (08/02 0315)  Labs: Recent Labs    12/28/20 2141 12/29/20 0340  HGB 11.9* 11.3*  HCT 38.6* 35.5*  PLT 198 165  LABPROT 15.8* 16.1*  INR 1.3* 1.3*  CREATININE 1.92* 1.77*     Estimated Creatinine Clearance: 57.4 mL/min (A) (by C-G formula based on SCr of 1.77 mg/dL (H)).   Medical History: Past Medical History:  Diagnosis Date   Absence epileptic syndrome, not intractable, without status epilepticus ([place]) 07/23/2019   Acute renal failure superimposed on stage 3 chronic kidney disease ([place]) 07/23/2019   Atherosclerotic heart disease 07/23/2019   Atrial fibrillation with RVR ([place]) 12/14/2019   Benign enlargement of prostate 07/23/2019   Body mass index 50.0-59.9, adult ([place]) 07/23/2019   Cellulitis of leg 12/14/2019   Chronic respiratory failure with hypoxia ([place]) 07/23/2019   Chronic venous hypertension (idiopathic) with ulcer of left lower extremity ([place]) 07/23/2019   COPD (chronic obstructive pulmonary disease) ([place]) 07/23/2019   Diabetic glomerulopathy ([place]) 10/15/2019   Diabetic neuropathy ([place]) 07/23/2019   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[place]) 10/15/2019   Epilepsy, grand mal ([place])    Essential hypertension 01/22/2018   Frozen shoulder    Right Shoulder   Gout attack 10/06/2019   H/O acute myocardial infarction 01/22/2018   H/O: stroke 01/22/2018   History of DVT (deep vein thrombosis) 01/22/2018   Incontinence without sensory awareness 07/23/2019   Long term (current) use of anticoagulants 07/23/2019   Lumbago    Lymphedema of both lower  extremities 01/22/2018   Mixed hyperlipidemia 07/23/2019   Morbid obesity ([place]) 07/23/2019   Obstructive sleep apnea 07/23/2019   Paroxysmal atrial fibrillation ([place]) 01/07/2020   Peripheral vascular disease ([place])    Peripheral vascular disease, unspecified ([place]) 07/12/2011   Secondary hypercoagulable state ([place]) 01/07/2020   Shoulder bursitis 07/25/2019   Stroke [facility])    Urinary incontinence 07/25/2019    Assessment: 65 y/o M from SNF with hypoxemia. Unable to follow commands in the ED. Having some abdominal pain. On warfarin PTA for history of DVT and afib. INR is low at 1.3. Hgb 11.9. Noted renal dysfunction.   Holding warfarin and starting heparin with likely procedures/surgery needed this visit.    Goal of Therapy:  Heparin level 0.3-0.7 units/mL Monitor platelets by anticoagulation protocol: Yes   Plan:  Start heparin drip at 1400 units/hr 1300 heparin level  Narda Bonds, PharmD, BCPS Clinical Pharmacist Phone: [phone]

## 2020-12-29 NOTE — Progress Notes (Signed)
Chief Complaint: Patient was seen in consultation today for perc chole drain  Referring Physician(s): Dr. Georganna Skeans  Supervising Physician: Michaelle Birks  Patient Status: [facility] - In-pt  History of Present Illness: Matthew Gutierrez is a 65 y.o. male who presented to the ER with altered mental status and fever. His workup showed leukocytosis and elevated LFTs. His imaging showed findings c/w acute cholecystitis. CT of the head was negative for stroke. Surgery team was consulted and given his overall status and underlying conditions, he is not felt to be a good surgical candidate at this time.  IR is asked to eval for percutaneous cholecystostomy drain. PMHx, meds, labs, imaging, allergies reviewed. Normally on Coumadin for a.fib. INR was 1.3 Pt started on heparin gtt in the interim. Has been NPO since arrival to ER Able to discuss with his sister via telephone.   Past Medical History:  Diagnosis Date   Absence epileptic syndrome, not intractable, without status epilepticus ([place]) 07/23/2019   Acute renal failure superimposed on stage 3 chronic kidney disease ([place]) 07/23/2019   Atherosclerotic heart disease 07/23/2019   Atrial fibrillation with RVR ([place]) 12/14/2019   Benign enlargement of prostate 07/23/2019   Body mass index 50.0-59.9, adult ([place]) 07/23/2019   Cellulitis of leg 12/14/2019   Chronic respiratory failure with hypoxia ([place]) 07/23/2019   Chronic venous hypertension (idiopathic) with ulcer of left lower extremity ([place]) 07/23/2019   COPD (chronic obstructive pulmonary disease) ([place]) 07/23/2019   Diabetic glomerulopathy ([place]) 10/15/2019   Diabetic neuropathy ([place]) 07/23/2019   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[place]) 10/15/2019   Epilepsy, grand mal ([place])    Essential hypertension 01/22/2018   Frozen shoulder    Right Shoulder   Gout attack 10/06/2019   H/O acute myocardial infarction 01/22/2018   H/O: stroke 01/22/2018   History of DVT (deep vein thrombosis) 01/22/2018   Incontinence  without sensory awareness 07/23/2019   Long term (current) use of anticoagulants 07/23/2019   Lumbago    Lymphedema of both lower extremities 01/22/2018   Mixed hyperlipidemia 07/23/2019   Morbid obesity ([place]) 07/23/2019   Obstructive sleep apnea 07/23/2019   Paroxysmal atrial fibrillation ([place]) 01/07/2020   Peripheral vascular disease (H[facility])    Peripheral vascular disease, unspecified ([place]) 07/12/2011   Secondary hypercoagulable state ([place]) 01/07/2020   Shoulder bursitis 07/25/2019   Stroke [facility])    Urinary incontinence 07/25/2019    Past Surgical History:  Procedure Laterality Date   CORONARY STENT PLACEMENT      Allergies: Codeine  Medications:  Current Facility-Administered Medications:    acetaminophen (TYLENOL) tablet 650 mg, 650 mg, Oral, Q6H PRN **OR** acetaminophen (TYLENOL) suppository 650 mg, 650 mg, Rectal, Q6H PRN, Chotiner, Yevonne Aline, MD   albuterol (PROVENTIL) (2.5 MG/3ML) 0.083% nebulizer solution 2.5 mg, 2.5 mg, Inhalation, Q4H PRN, Chotiner, Yevonne Aline, MD   carvedilol (COREG) tablet 3.125 mg, 3.125 mg, Oral, BID WC, Chotiner, Yevonne Aline, MD   ceFEPIme (MAXIPIME) 2 g in sodium chloride 0.9 % 100 mL IVPB, 2 g, Intravenous, Q12H, Erenest Blank, RPH, Stopped at 12/29/20 1000   diltiazem (CARDIZEM) tablet 60 mg, 60 mg, Oral, BID, Chotiner, Yevonne Aline, MD   gabapentin (NEURONTIN) capsule 300 mg, 300 mg, Oral, QHS, Chotiner, Yevonne Aline, MD   heparin ADULT infusion 100 units/mL (25000 units/257m), 1,400 Units/hr, Intravenous, Continuous, LErenest Blank RPH, Held at 12/29/20 [id number]  HYDROmorphone (DILAUDID) injection 1 mg, 1 mg, Intravenous, Q3H PRN, Chotiner, BYevonne Aline MD   insulin aspart (novoLOG) injection 0-9 Units, 0-9 Units, Subcutaneous,  Q6H PRN, Chotiner, Yevonne Aline, MD   isosorbide mononitrate (IMDUR) 24 hr tablet 120 mg, 120 mg, Oral, Daily, Chotiner, Yevonne Aline, MD   lactated ringers infusion, , Intravenous, Continuous, Chotiner, Yevonne Aline, MD, Last Rate: 75 mL/hr at  12/29/20 0912, Infusion Verify at 12/29/20 0912   metroNIDAZOLE (FLAGYL) IVPB 500 mg, 500 mg, Intravenous, Q8H, Chotiner, Yevonne Aline, MD, Last Rate: 100 mL/hr at 12/29/20 0921, 500 mg at 12/29/20 0921   ondansetron (ZOFRAN) tablet 4 mg, 4 mg, Oral, Q6H PRN **OR** ondansetron (ZOFRAN) injection 4 mg, 4 mg, Intravenous, Q6H PRN, Chotiner, Yevonne Aline, MD   phenytoin (DILANTIN) ER capsule 300 mg, 300 mg, Oral, BID, Chotiner, Yevonne Aline, MD   pravastatin (PRAVACHOL) tablet 40 mg, 40 mg, Oral, Daily, Chotiner, Yevonne Aline, MD   vancomycin (VANCOREADY) IVPB 1250 mg/250 mL, 1,250 mg, Intravenous, Q24H, Erenest Blank, RPH  Current Outpatient Medications:    albuterol (PROVENTIL) (2.5 MG/3ML) 0.083% nebulizer solution, Inhale 3 mLs (2.5 mg total) into the lungs every 6 (six) hours as needed for wheezing or shortness of breath., Disp: 75 mL, Rfl: 2   aspirin 325 MG tablet, Take 325 mg by mouth daily., Disp: , Rfl:    atorvastatin (LIPITOR) 10 MG tablet, Take 10 mg by mouth daily., Disp: , Rfl:    Cholecalciferol (VITAMIN D3) 1.25 MG (50000 UT) TABS, Take 50,000 Units by mouth 2 (two) times a week. Every Wed and Sat, Disp: , Rfl:    divalproex (DEPAKOTE) 125 MG DR tablet, Take 125 mg by mouth 2 (two) times daily., Disp: , Rfl:    divalproex (DEPAKOTE) 500 MG DR tablet, Take 500 mg by mouth 2 (two) times daily., Disp: , Rfl:    Dulaglutide (TRULICITY) [id number] SOPN, Inject 0.75 mg into the skin every Tuesday., Disp: , Rfl:    DULoxetine (CYMBALTA) 60 MG capsule, Take 60 mg by mouth daily., Disp: , Rfl:    EPINEPHrine 0.3 mg/0.3 mL IJ SOAJ injection, Inject 0.3 mg into the muscle as needed for anaphylaxis., Disp: 1 each, Rfl: 2   fluticasone (FLONASE) 50 MCG/ACT nasal spray, Place 1 spray into both nostrils daily. (Patient taking differently: Place 1 spray into both nostrils daily as needed for allergies.), Disp: 18.2 mL, Rfl: 2   gabapentin (NEURONTIN) 300 MG capsule, Take 1 capsule (300 mg total) by mouth  at bedtime., Disp: 90 capsule, Rfl: 2   glucose blood test strip, 1 each by Other route as needed. Use as instructed, Disp: 100 each, Rfl: 2   isosorbide mononitrate (IMDUR) 120 MG 24 hr tablet, Take 1 tablet (120 mg total) by mouth daily., Disp: 90 tablet, Rfl: 2   metoprolol succinate (TOPROL-XL) 25 MG 24 hr tablet, Take 25 mg by mouth daily., Disp: , Rfl:    polyethylene glycol powder (GLYCOLAX/MIRALAX) 17 GM/SCOOP powder, Take 1 Container by mouth every morning., Disp: , Rfl:    potassium chloride (KLOR-CON) 10 MEQ tablet, Take 10 mEq by mouth daily., Disp: , Rfl:    risperiDONE (RISPERDAL) 1 MG tablet, Take 1 mg by mouth 2 (two) times daily., Disp: , Rfl:    torsemide (DEMADEX) 20 MG tablet, Take 20 mg by mouth daily., Disp: , Rfl:    allopurinol (ZYLOPRIM) 300 MG tablet, Take 1 tablet (300 mg total) by mouth daily. (Patient not taking: No sig reported), Disp: 90 tablet, Rfl: 1   carvedilol (COREG) 3.125 MG tablet, Take 1 tablet (3.125 mg total) by mouth 2 (two) times daily with a meal. (Patient  not taking: No sig reported), Disp: 60 tablet, Rfl: 6   diltiazem (CARDIZEM) 60 MG tablet, Take 1 tablet (60 mg total) by mouth 2 (two) times daily. (Patient not taking: No sig reported), Disp: 60 tablet, Rfl: 6   metFORMIN (GLUCOPHAGE) 1000 MG tablet, Take 1,000 mg by mouth 2 (two) times daily with a meal. (Patient not taking: No sig reported), Disp: , Rfl:    phenytoin (DILANTIN) 300 MG ER capsule, Take 1 capsule (300 mg total) by mouth 2 (two) times daily. Patient currently on 100 mg 2 times a day (Patient not taking: No sig reported), Disp: 180 capsule, Rfl: 2   pravastatin (PRAVACHOL) 40 MG tablet, Take 1 tablet (40 mg total) by mouth daily. (Patient not taking: No sig reported), Disp: 90 tablet, Rfl: 2   TOVIAZ 4 MG TB24 tablet, TAKE 1 TABLET EVERY DAY (Patient not taking: No sig reported), Disp: 90 tablet, Rfl: 2   triamcinolone cream (KENALOG) 0.1 %, APPLY TO THE AFFECTED AREA(S) TWICE DAILY AS  NEEDED (Patient not taking: No sig reported), Disp: 454 g, Rfl: 2   warfarin (COUMADIN) 2 MG tablet, Take 1 tablet (2 mg total) by mouth 3 (three) times a week. Monday, Wednesday and Friday (Patient not taking: No sig reported), Disp: 30 tablet, Rfl: 3   warfarin (COUMADIN) 4 MG tablet, Take 4 mg by mouth daily. Saturday, Sunday,tuesday and thursday (Patient not taking: No sig reported), Disp: , Rfl:     Family History  Problem Relation Age of Onset   Cancer Mother    Stroke Father    Hypertension Other    Stroke Other    Hyperlipidemia Other    Alcohol abuse Other    Heart disease Other    Cancer Other     Social History   Socioeconomic History   Marital status: Divorced    Spouse name: Not on file   Number of children: 2   Years of education: Not on file   Highest education level: Not on file  Occupational History   Occupation: Disability  Tobacco Use   Smoking status: Former    Types: Cigarettes    Quit date: 10/28/2009    Years since quitting: 11.1   Smokeless tobacco: Never  Substance and Sexual Activity   Alcohol use: Not Currently    Comment: occassionally   Drug use: No   Sexual activity: Not Currently  Other Topics Concern   Not on file  Social History Narrative   Not on file   Social Determinants of Health   Financial Resource Strain: Not on file  Food Insecurity: Not on file  Transportation Needs: Not on file  Physical Activity: Not on file  Stress: Not on file  Social Connections: Not on file    Review of Systems: A 12 point ROS discussed and pertinent positives are indicated in the HPI above.  All other systems are negative.  Review of Systems  Vital Signs: BP 119/78   Pulse 92   Temp 98.3 F (36.8 C) (Oral)   Resp (!) 21   Ht 6' (1.829 m)   Wt 130.6 kg   SpO2 100%   BMI 39.05 kg/m   Physical Exam Constitutional:      General: He is not in acute distress.    Appearance: He is obese. He is not toxic-appearing.  HENT:      Mouth/Throat:     Mouth: Mucous membranes are moist.     Pharynx: Oropharynx is clear.  Cardiovascular:  Rate and Rhythm: Normal rate. Rhythm irregular.  Pulmonary:     Effort: Pulmonary effort is normal. No respiratory distress.     Breath sounds: Normal breath sounds.  Abdominal:     General: Abdomen is flat.     Palpations: Abdomen is soft.     Tenderness: There is abdominal tenderness. There is no guarding.  Neurological:     Mental Status: He is disoriented.   Awake, doesn't really follow commands. Nonverbal responses Does grimace and moan with RUQ palpation    Imaging: CT HEAD WO CONTRAST (5MM)  Result Date: 12/28/2020 CLINICAL DATA:  Mental status change, unknown cause altered mental status. EXAM: CT HEAD WITHOUT CONTRAST TECHNIQUE: Contiguous axial images were obtained from the base of the skull through the vertex without intravenous contrast. COMPARISON:  None. FINDINGS: Brain: Normal anatomic configuration. There is moderate parenchymal volume loss, slightly advanced given the patient's age. Remote lacunar infarcts are noted within the left caudate nucleus, left insular cortex, and right thalamus. Periventricular white matter changes are present likely reflecting the sequela of small vessel ischemia. No abnormal intra or extra-axial mass lesion or fluid collection. No abnormal mass effect or midline shift. No evidence of acute intracranial hemorrhage or infarct. Ventricular size is normal. Cerebellum unremarkable. Vascular: No asymmetric hyperdense vasculature at the skull base. Skull: Intact Sinuses/Orbits: Paranasal sinuses are clear. Orbits are unremarkable. Other: Mastoid air cells and middle ear cavities are clear. IMPRESSION: No acute intracranial abnormality. Advanced senescent change. Multiple remote infarcts as noted above. Electronically Signed   By: Fidela Salisbury MD   On: 12/28/2020 23:45   CT ABDOMEN PELVIS W CONTRAST  Result Date: 12/28/2020 CLINICAL DATA:   Abdominal pain, biliary obstruction suspected (Ped 0-18y) EXAM: CT ABDOMEN AND PELVIS WITH CONTRAST TECHNIQUE: Multidetector CT imaging of the abdomen and pelvis was performed using the standard protocol following bolus administration of intravenous contrast. CONTRAST:  168m OMNIPAQUE IOHEXOL 300 MG/ML  SOLN COMPARISON:  04/11/2020 FINDINGS: Lower chest: Small right pleural effusion noted at the visualized right lung base with right basilar atelectasis. Mild left basilar atelectasis. Extensive multi-vessel coronary artery calcification with probable stenting of the left anterior descending coronary artery. Cardiac size is mildly enlarged. No pericardial effusion. Hepatobiliary: The gallbladder is distended, the gallbladder wall is thickened, and there is extensive pericholecystic inflammatory change in keeping with changes of acute cholecystitis. The extrahepatic bile duct is not dilated. There is no intrahepatic biliary ductal dilation. The liver is unremarkable. Pancreas: Unremarkable Spleen: Unremarkable Adrenals/Urinary Tract: Adrenal glands are unremarkable. Kidneys are normal, without renal calculi, focal lesion, or hydronephrosis. Bladder is unremarkable. Stomach/Bowel: The stomach, small bowel, and large bowel are unremarkable. The appendix is not visualized and is likely absent. No free intraperitoneal gas or fluid. Vascular/Lymphatic: Mild aortoiliac atherosclerotic calcification. Advanced atherosclerotic calcification is seen within the visualized lower extremity arterial outflow. No aortic aneurysm. No pathologic adenopathy within the abdomen and pelvis. Reproductive: Prostate is unremarkable. Other: Tiny fat containing umbilical hernia.  Rectum unremarkable. Musculoskeletal: No acute bone abnormality. No lytic or blastic bone lesions are seen. IMPRESSION: Acute cholecystitis.  Surgical consultation is advised. Extensive coronary artery calcification.  Mild global cardiomegaly. Aortic Atherosclerosis  (ICD10-I70.0). Electronically Signed   By: AFidela SalisburyMD   On: 12/28/2020 23:50   DG CHEST PORT 1 VIEW  Result Date: 12/29/2020 CLINICAL DATA:  Hypoxia EXAM: PORTABLE CHEST 1 VIEW COMPARISON:  Chest x-ray 12/28/2020, CT abdomen pelvis 12/28/2020 FINDINGS: Enlarged cardiac silhouette. The heart size and mediastinal contours are unchanged. Low  lung volumes with bibasilar streaky airspace opacities likely representing atelectasis. Elevated right hemidiaphragm no focal consolidation. No pulmonary edema. Trace right pleural effusion. No left pleural effusion. No pneumothorax. No acute osseous abnormality. IMPRESSION: 1. Trace right pleural effusion. 2. Low lung volumes. 3. Elevated right hemidiaphragm. Electronically Signed   By: Iven Finn M.D.   On: 12/29/2020 03:59   DG Chest Portable 1 View  Result Date: 12/28/2020 CLINICAL DATA:  Tachycardia. EXAM: PORTABLE CHEST 1 VIEW COMPARISON:  04/11/2020 FINDINGS: Low lung volumes persist. Stable heart size, likely accentuated by technique and low lung volumes. Unchanged mediastinal contours. Streaky bibasilar atelectasis or scarring. No pulmonary edema, pleural effusion or pneumothorax. Patient's chin partially obscures the right lung apex. No acute osseous abnormalities are seen. IMPRESSION: Low lung volumes with streaky bibasilar atelectasis or scarring. Electronically Signed   By: Keith Rake M.D.   On: 12/28/2020 22:58   US Abdomen Limited RUQ (LIVER/GB)  Result Date: 12/28/2020 CLINICAL DATA:  Abdominal pain. EXAM: ULTRASOUND ABDOMEN LIMITED RIGHT UPPER QUADRANT COMPARISON:  CT abdomen pelvis 04/11/2020 FINDINGS: Gallbladder: Abnormal gallbladder markedly limited evaluation - question of a heterogeneous structure distending the gallbladder. Common bile duct: Diameter: 4 mm Liver: Markedly limited evaluation. Unable to evaluate for focal hepatic lesion. Normal parenchymal echogenicity. Portal vein is patent on color Doppler imaging with normal  direction of blood flow towards the liver. Other: None. IMPRESSION: 1. Abnormal gallbladder with markedly limited/nondiagnostic evaluation - question of a heterogeneous structure distending the gallbladder. Recommend CT abdomen pelvis with intravenous contrast for further evaluation. 2. Markedly limited evaluation of the liver due to decreased penetration of the sonographic waves. Electronically Signed   By: Iven Finn M.D.   On: 12/28/2020 22:45    Labs:  CBC: Recent Labs    03/12/20 1202 03/26/20 1137 12/28/20 2141 12/29/20 0340  WBC 12.5* 11.7* 20.4* 16.7*  HGB 8.8* 9.2* 11.9* 11.3*  HCT 26.9* 28.6* 38.6* 35.5*  PLT 203 242 198 165    COAGS: Recent Labs    03/12/20 1202 03/26/20 1137 12/28/20 2141 12/29/20 0340  INR 3.6* 3.2* 1.3* 1.3*    BMP: Recent Labs    01/16/20 1529 01/31/20 1129 03/12/20 1202 03/26/20 1137 12/28/20 2141 12/29/20 0340  NA 138 136 136 137 131* 135  K 3.7 3.9 5.0 5.0 3.8 3.5  CL 90* 90* 93* 94* 89* 94*  CO2 30* '27 27 23 27 30  '$ GLUCOSE 143* 167* 130* 184* 248* 207*  BUN 42* 34* 31* 59* 32* 28*  CALCIUM 7.0* 7.7* 8.8 9.3 9.2 8.8*  CREATININE 2.20* 1.74* 1.70* 1.57* 1.92* 1.77*  GFRNONAA 30* 40* 41* 46* 38* 42*  GFRAA 35* 47* 48* 53*  --   --     LIVER FUNCTION TESTS: Recent Labs    03/12/20 1202 03/26/20 1137 12/28/20 2141 12/29/20 0340  BILITOT <0.2 0.4 5.2* 4.6*  AST 13 16 54* 48*  ALT '17 30 24 23  '$ ALKPHOS 143* 154* 267* 244*  PROT 7.3 8.1 7.6 6.7  ALBUMIN 3.7* 4.3 2.0* 1.7*    TUMOR MARKERS: No results for input(s): AFPTM, CEA, CA199, CHROMGRNA in the last 8760 hours.  Assessment and Plan: Acute cholecystitis Imaging reviewed. Plan for perc chole drain placement Heparin gtt stopped. Discussed with sister Rise Paganini via telephone Risks and benefits discussed including, but not limited to bleeding, infection, gallbladder perforation, bile leak, sepsis or even death.  All questions were answered, sister/patient is  agreeable to proceed. Consent signed and in chart.   Thank you  for this interesting consult.  I greatly enjoyed meeting Matthew Gutierrez and look forward to participating in their care.  A copy of this report was sent to the requesting provider on this date.  Electronically Signed: Ascencion Dike, PA-C 12/29/2020, 9:56 AM   I spent a total of 20 minutes in face to face in clinical consultation, greater than 50% of which was counseling/coordinating care for perc chole drain

## 2020-12-29 NOTE — H&P (View-Only) (Signed)
Consultation  Referring Provider: Dr. Starla Link     Primary Care Physician:  Lillard Anes, MD Primary Gastroenterologist: Althia Forts        Reason for Consultation: Choledocholithiasis             HPI:   Matthew Gutierrez is a 65 y.o. male with a past medical history significant for diabetes type 2, CKD stage III, A. fib, COPD, neuropathy due to diabetes, history of stroke, OSA, PVD and others listed below, who presented to the hospital with altered mental status and hypoxia.  We are consulted in regards to a finding of choledocholithiasis.    Upon admission to the hospital it was reported the patient is normally conversational at his baseline in the evening of December 28, 2020 patient was found to be hypoxic with an oxygenation sat in the mid 80s on room air and had altered mental status and minimally said his name but would not interaction with airway.    During time of my interview patient is nonconversational.  Per nursing staff his mental status has not changed since time of admission to the hospital.  History is garnered from previous physicians notes.  Did call and speak with his sister who did not have a lot of details about his current condition but is the person in charge of his care here.  ED course: Temp of 101.4, found to be septic with elevated lactic acid, encephalopathy, tachycardia, tachypnea and imaging revealing acute cholecystitis, elevated bili, surgery was consulted and recommended MRCP in light of his clinical situation, lab work revealed sodium of 131, potassium 3.8, chloride 89, bicarb 27, glucose 248, creatinine 1.92 (baseline 1.5), AST 54, ALT 24, bili 5.2, lactic acid 2.2 with a repeat of 2.5, WBC is 20,400, hemoglobin 11.9, platelets 190,000  Past Medical History:  Diagnosis Date   Absence epileptic syndrome, not intractable, without status epilepticus ([place]) 07/23/2019   Acute renal failure superimposed on stage 3 chronic kidney disease ([place]) 07/23/2019    Atherosclerotic heart disease 07/23/2019   Atrial fibrillation with RVR ([place]) 12/14/2019   Benign enlargement of prostate 07/23/2019   Body mass index 50.0-59.9, adult ([place]) 07/23/2019   Cellulitis of leg 12/14/2019   Chronic respiratory failure with hypoxia ([place]) 07/23/2019   Chronic venous hypertension (idiopathic) with ulcer of left lower extremity ([place]) 07/23/2019   COPD (chronic obstructive pulmonary disease) ([place]) 07/23/2019   Diabetic glomerulopathy ([place]) 10/15/2019   Diabetic neuropathy ([place]) 07/23/2019   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[place]) 10/15/2019   Epilepsy, grand mal ([place])    Essential hypertension 01/22/2018   Frozen shoulder    Right Shoulder   Gout attack 10/06/2019   H/O acute myocardial infarction 01/22/2018   H/O: stroke 01/22/2018   History of DVT (deep vein thrombosis) 01/22/2018   Incontinence without sensory awareness 07/23/2019   Long term (current) use of anticoagulants 07/23/2019   Lumbago    Lymphedema of both lower extremities 01/22/2018   Mixed hyperlipidemia 07/23/2019   Morbid obesity ([place]) 07/23/2019   Obstructive sleep apnea 07/23/2019   Paroxysmal atrial fibrillation ([place]) 01/07/2020   Peripheral vascular disease ([place])    Peripheral vascular disease, unspecified ([place]) 07/12/2011   Secondary hypercoagulable state ([place]) 01/07/2020   Shoulder bursitis 07/25/2019   Stroke ([place])    Urinary incontinence 07/25/2019    Past Surgical History:  Procedure Laterality Date   CORONARY STENT PLACEMENT     IR PERC CHOLECYSTOSTOMY  12/29/2020    Family History  Problem Relation Age of Onset  Cancer Mother    Stroke Father    Hypertension Other    Stroke Other    Hyperlipidemia Other    Alcohol abuse Other    Heart disease Other    Cancer Other     Social History   Tobacco Use   Smoking status: Former    Types: Cigarettes    Quit date: 10/28/2009    Years since quitting: 11.1   Smokeless tobacco: Never  Substance Use Topics   Alcohol use: Not Currently    Comment:  occassionally   Drug use: No    Prior to Admission medications   Medication Sig Start Date End Date Taking? Authorizing Provider  albuterol (PROVENTIL) (2.5 MG/3ML) 0.083% nebulizer solution Inhale 3 mLs (2.5 mg total) into the lungs every 6 (six) hours as needed for wheezing or shortness of breath. 03/18/20  Yes Lillard Anes, MD  aspirin 325 MG tablet Take 325 mg by mouth daily.   Yes [provider]  atorvastatin (LIPITOR) 10 MG tablet Take 10 mg by mouth daily.   Yes [provider]  Cholecalciferol (VITAMIN D3) 1.25 MG (50000 UT) TABS Take 50,000 Units by mouth 2 (two) times a week. Every Wed and Sat   Yes [provider]  divalproex (DEPAKOTE) 125 MG DR tablet Take 125 mg by mouth 2 (two) times daily.   Yes [provider]  divalproex (DEPAKOTE) 500 MG DR tablet Take 500 mg by mouth 2 (two) times daily.   Yes [provider]  Dulaglutide (TRULICITY) A999333 0000000 SOPN Inject 0.75 mg into the skin every Tuesday.   Yes [provider]  DULoxetine (CYMBALTA) 60 MG capsule Take 60 mg by mouth daily.   Yes [provider]  EPINEPHrine 0.3 mg/0.3 mL IJ SOAJ injection Inject 0.3 mg into the muscle as needed for anaphylaxis. 03/18/20  Yes Lillard Anes, MD  fluticasone [facility]) 50 MCG/ACT nasal spray Place 1 spray into both nostrils daily. Patient taking differently: Place 1 spray into both nostrils daily as needed for allergies. 10/30/19  Yes Lillard Anes, MD  gabapentin (NEURONTIN) 300 MG capsule Take 1 capsule (300 mg total) by mouth at bedtime. 11/18/19  Yes Lillard Anes, MD  glucose blood test strip 1 each by Other route as needed. Use as instructed 03/17/20  Yes Lillard Anes, MD  isosorbide mononitrate (IMDUR) 120 MG 24 hr tablet Take 1 tablet (120 mg total) by mouth daily. 01/16/20  Yes Lillard Anes, MD  metoprolol succinate (TOPROL-XL) 25 MG 24 hr tablet Take 25 mg by mouth  daily.   Yes [provider]  polyethylene glycol powder (GLYCOLAX/MIRALAX) 17 GM/SCOOP powder Take 1 Container by mouth every morning.   Yes [provider]  potassium chloride (KLOR-CON) 10 MEQ tablet Take 10 mEq by mouth daily.   Yes [provider]  risperiDONE (RISPERDAL) 1 MG tablet Take 1 mg by mouth 2 (two) times daily.   Yes [provider]  torsemide (DEMADEX) 20 MG tablet Take 20 mg by mouth daily.   Yes [provider]  allopurinol (ZYLOPRIM) 300 MG tablet Take 1 tablet (300 mg total) by mouth daily. Patient not taking: No sig reported 03/18/20   Lillard Anes, MD  carvedilol (COREG) 3.125 MG tablet Take 1 tablet (3.125 mg total) by mouth 2 (two) times daily with a meal. Patient not taking: No sig reported 02/05/20   Lillard Anes, MD  diltiazem (CARDIZEM) 60 MG tablet Take 1 tablet (  60 mg total) by mouth 2 (two) times daily. Patient not taking: No sig reported 02/05/20   Lillard Anes, MD  metFORMIN (GLUCOPHAGE) 1000 MG tablet Take 1,000 mg by mouth 2 (two) times daily with a meal. Patient not taking: No sig reported    [provider]  phenytoin (DILANTIN) 300 MG ER capsule Take 1 capsule (300 mg total) by mouth 2 (two) times daily. Patient currently on 100 mg 2 times a day Patient not taking: No sig reported 01/16/20   Lillard Anes, MD  pravastatin (PRAVACHOL) 40 MG tablet Take 1 tablet (40 mg total) by mouth daily. Patient not taking: No sig reported 11/18/19   Lillard Anes, MD  TOVIAZ 4 MG TB24 tablet TAKE 1 TABLET EVERY DAY Patient not taking: No sig reported 03/25/20   Lillard Anes, MD  triamcinolone cream (KENALOG) 0.1 % APPLY TO THE AFFECTED AREA(S) TWICE DAILY AS NEEDED Patient not taking: No sig reported 11/16/19   Lillard Anes, MD  warfarin (COUMADIN) 2 MG tablet Take 1 tablet (2 mg total) by mouth 3 (three) times a week. Monday, Wednesday and Friday Patient  not taking: No sig reported 03/18/20   Lillard Anes, MD  warfarin (COUMADIN) 4 MG tablet Take 4 mg by mouth daily. Saturday, Sunday,tuesday and thursday Patient not taking: No sig reported    [provider]    Current Facility-Administered Medications  Medication Dose Route Frequency Provider Last Rate Last Admin   acetaminophen (TYLENOL) tablet 650 mg  650 mg Oral Q6H PRN Chotiner, Yevonne Aline, MD       Or   acetaminophen (TYLENOL) suppository 650 mg  650 mg Rectal Q6H PRN Chotiner, Yevonne Aline, MD       albuterol (PROVENTIL) (2.5 MG/3ML) 0.083% nebulizer solution 2.5 mg  2.5 mg Inhalation Q4H PRN Chotiner, Yevonne Aline, MD       ceFEPIme (MAXIPIME) 2 g in sodium chloride 0.9 % 100 mL IVPB  2 g Intravenous Q12H Erenest Blank, RPH   Stopped at 12/29/20 1000   gabapentin (NEURONTIN) capsule 300 mg  300 mg Oral QHS Chotiner, Yevonne Aline, MD       heparin ADULT infusion 100 units/mL (25000 units/221m)  1,400 Units/hr Intravenous Continuous Alekh, Kshitiz, MD       HYDROmorphone (DILAUDID) injection 1 mg  1 mg Intravenous Q3H PRN Chotiner, BYevonne Aline MD       insulin aspart (novoLOG) injection 0-9 Units  0-9 Units Subcutaneous Q6H PRN Chotiner, BYevonne Aline MD       lactated ringers infusion   Intravenous Continuous Chotiner, BYevonne Aline MD 75 mL/hr at 12/29/20 0912 Infusion Verify at 12/29/20 0912   lidocaine (XYLOCAINE) 1 % (with pres) injection            metroNIDAZOLE (FLAGYL) IVPB 500 mg  500 mg Intravenous Q8H Chotiner, BYevonne Aline MD   Stopped at 12/29/20 1215   ondansetron (ZOFRAN) tablet 4 mg  4 mg Oral Q6H PRN Chotiner, BYevonne Aline MD       Or   ondansetron (ZOFRAN) injection 4 mg  4 mg Intravenous Q6H PRN Chotiner, BYevonne Aline MD       valproate (DEPACON) 625 mg in dextrose 5 % 50 mL IVPB  625 mg Intravenous BID Alekh, Kshitiz, MD       vancomycin (VANCOREADY) IVPB 1250 mg/250 mL  1,250 mg Intravenous Q24H [facility]renest Blank [facility]      Current Outpatient Medications   Medication Sig  Dispense Refill   albuterol (PROVENTIL) (2.5 MG/3ML) 0.083% nebulizer solution Inhale 3 mLs (2.5 mg total) into the lungs every 6 (six) hours as needed for wheezing or shortness of breath. 75 mL 2   aspirin 325 MG tablet Take 325 mg by mouth daily.     atorvastatin (LIPITOR) 10 MG tablet Take 10 mg by mouth daily.     Cholecalciferol (VITAMIN D3) 1.25 MG (50000 UT) TABS Take 50,000 Units by mouth 2 (two) times a week. Every Wed and Sat     divalproex (DEPAKOTE) 125 MG DR tablet Take 125 mg by mouth 2 (two) times daily.     divalproex (DEPAKOTE) 500 MG DR tablet Take 500 mg by mouth 2 (two) times daily.     Dulaglutide (TRULICITY) [id number] [id number] SOPN Inject 0.75 mg into the skin every Tuesday.     DULoxetine (CYMBALTA) 60 MG capsule Take 60 mg by mouth daily.     EPINEPHrine 0.3 mg/0.3 mL IJ SOAJ injection Inject 0.3 mg into the muscle as needed for anaphylaxis. 1 each 2   fluticasone (FLONASE) 50 MCG/ACT nasal spray Place 1 spray into both nostrils daily. (Patient taking differently: Place 1 spray into both nostrils daily as needed for allergies.) 18.2 mL 2   gabapentin (NEURONTIN) 300 MG capsule Take 1 capsule (300 mg total) by mouth at bedtime. 90 capsule 2   glucose blood test strip 1 each by Other route as needed. Use as instructed 100 each 2   isosorbide mononitrate (IMDUR) 120 MG 24 hr tablet Take 1 tablet (120 mg total) by mouth daily. 90 tablet 2   metoprolol succinate (TOPROL-XL) 25 MG 24 hr tablet Take 25 mg by mouth daily.     polyethylene glycol powder (GLYCOLAX/MIRALAX) 17 GM/SCOOP powder Take 1 Container by mouth every morning.     potassium chloride (KLOR-CON) 10 MEQ tablet Take 10 mEq by mouth daily.     risperiDONE (RISPERDAL) 1 MG tablet Take 1 mg by mouth 2 (two) times daily.     torsemide (DEMADEX) 20 MG tablet Take 20 mg by mouth daily.     allopurinol (ZYLOPRIM) 300 MG tablet Take 1 tablet (300 mg total) by mouth daily. (Patient not taking: No sig reported) 90  tablet 1   carvedilol (COREG) 3.125 MG tablet Take 1 tablet (3.125 mg total) by mouth 2 (two) times daily with a meal. (Patient not taking: No sig reported) 60 tablet 6   diltiazem (CARDIZEM) 60 MG tablet Take 1 tablet (60 mg total) by mouth 2 (two) times daily. (Patient not taking: No sig reported) 60 tablet 6   metFORMIN (GLUCOPHAGE) 1000 MG tablet Take 1,000 mg by mouth 2 (two) times daily with a meal. (Patient not taking: No sig reported)     phenytoin (DILANTIN) 300 MG ER capsule Take 1 capsule (300 mg total) by mouth 2 (two) times daily. Patient currently on 100 mg 2 times a day (Patient not taking: No sig reported) 180 capsule 2   pravastatin (PRAVACHOL) 40 MG tablet Take 1 tablet (40 mg total) by mouth daily. (Patient not taking: No sig reported) 90 tablet 2   TOVIAZ 4 MG TB24 tablet TAKE 1 TABLET EVERY DAY (Patient not taking: No sig reported) 90 tablet 2   triamcinolone cream (KENALOG) 0.1 % APPLY TO THE AFFECTED AREA(S) TWICE DAILY AS NEEDED (Patient not taking: No sig reported) 454 g 2   warfarin (COUMADIN) 2 MG tablet Take 1 tablet (2 mg total) by mouth 3 (three) times a  week. Monday, Wednesday and Friday (Patient not taking: No sig reported) 30 tablet 3   warfarin (COUMADIN) 4 MG tablet Take 4 mg by mouth daily. Saturday, Sunday,tuesday and thursday (Patient not taking: No sig reported)      Allergies as of 12/28/2020 - Review Complete 03/26/2020  Allergen Reaction Noted   Codeine  07/08/2011     Review of Systems:    Unable to obtain due to mental status    Physical Exam:  Vital signs in last 24 hours: Temp:  [98.3 F (36.8 C)-101.4 F (38.6 C)] 98.3 F (36.8 C) (08/01 2345) Pulse Rate:  [84-110] 100 (08/02 1130) Resp:  [16-37] 16 (08/02 1130) BP: (112-163)/(67-96) 131/77 (08/02 1130) SpO2:  [86 %-100 %] 99 % (08/02 1130) Weight:  [130.6 kg] 130.6 kg (08/01 2232)   General:   Ill-appearing, jaundiced, overweight Caucasian male, Well developed, Well nourished, slightly  awakens to voice but does not answer questions or follow commands Head:  Normocephalic and atraumatic. Eyes:   PEERL, EOMI. Icteric, Conjunctiva pink. Ears:  Normal auditory acuity. Neck:  Supple Throat: Oral cavity and pharynx without inflammation, swelling or lesion. Lungs: Respirations even and unlabored. Lungs clear to auscultation bilaterally.   No wheezes, crackles, or rhonchi.  Heart: Normal S1, S2. No MRG. Regular rate and rhythm. No peripheral edema, cyanosis or pallor.  Abdomen: Soft nondistended, grimacing to right upper quadrant palpation, normal bowel sounds. No appreciable masses or hepatomegaly. Rectal:  Not performed.  Msk:  Symmetrical without gross deformities. Peripheral pulses intact.  Extremities:  Without edema, no deformity or joint abnormality.  Neurologic: Obtunded, awakens to voice slightly, unable to answer questions or follow commands Skin:   + Petechiae to the level of the shin on both legs, unkempt toenails Psychiatric: Obtunded   LAB RESULTS: Recent Labs    12/28/20 2141 12/29/20 0340  WBC 20.4* 16.7*  HGB 11.9* 11.3*  HCT 38.6* 35.5*  PLT 198 165   BMET Recent Labs    12/28/20 2141 12/29/20 0340  NA 131* 135  K 3.8 3.5  CL 89* 94*  CO2 27 30  GLUCOSE 248* 207*  BUN 32* 28*  CREATININE 1.92* 1.77*  CALCIUM 9.2 8.8*   LFT Recent Labs    12/29/20 0340  PROT 6.7  ALBUMIN 1.7*  AST 48*  ALT 23  ALKPHOS 244*  BILITOT 4.6*  BILIDIR 2.8*  IBILI 1.8*   PT/INR Recent Labs    12/28/20 2141 12/29/20 0340  LABPROT 15.8* 16.1*  INR 1.3* 1.3*    STUDIES: CT HEAD WO CONTRAST (5MM)  Result Date: 12/28/2020 CLINICAL DATA:  Mental status change, unknown cause altered mental status. EXAM: CT HEAD WITHOUT CONTRAST TECHNIQUE: Contiguous axial images were obtained from the base of the skull through the vertex without intravenous contrast. COMPARISON:  None. FINDINGS: Brain: Normal anatomic configuration. There is moderate parenchymal volume  loss, slightly advanced given the patient's age. Remote lacunar infarcts are noted within the left caudate nucleus, left insular cortex, and right thalamus. Periventricular white matter changes are present likely reflecting the sequela of small vessel ischemia. No abnormal intra or extra-axial mass lesion or fluid collection. No abnormal mass effect or midline shift. No evidence of acute intracranial hemorrhage or infarct. Ventricular size is normal. Cerebellum unremarkable. Vascular: No asymmetric hyperdense vasculature at the skull base. Skull: Intact Sinuses/Orbits: Paranasal sinuses are clear. Orbits are unremarkable. Other: Mastoid air cells and middle ear cavities are clear. IMPRESSION: No acute intracranial abnormality. Advanced senescent change. Multiple remote infarcts  as noted above. Electronically Signed   By: Fidela Salisbury MD   On: 12/28/2020 23:45   CT ABDOMEN PELVIS W CONTRAST  Result Date: 12/28/2020 CLINICAL DATA:  Abdominal pain, biliary obstruction suspected (Ped 0-18y) EXAM: CT ABDOMEN AND PELVIS WITH CONTRAST TECHNIQUE: Multidetector CT imaging of the abdomen and pelvis was performed using the standard protocol following bolus administration of intravenous contrast. CONTRAST:  157m OMNIPAQUE IOHEXOL 300 MG/ML  SOLN COMPARISON:  04/11/2020 FINDINGS: Lower chest: Small right pleural effusion noted at the visualized right lung base with right basilar atelectasis. Mild left basilar atelectasis. Extensive multi-vessel coronary artery calcification with probable stenting of the left anterior descending coronary artery. Cardiac size is mildly enlarged. No pericardial effusion. Hepatobiliary: The gallbladder is distended, the gallbladder wall is thickened, and there is extensive pericholecystic inflammatory change in keeping with changes of acute cholecystitis. The extrahepatic bile duct is not dilated. There is no intrahepatic biliary ductal dilation. The liver is unremarkable. Pancreas:  Unremarkable Spleen: Unremarkable Adrenals/Urinary Tract: Adrenal glands are unremarkable. Kidneys are normal, without renal calculi, focal lesion, or hydronephrosis. Bladder is unremarkable. Stomach/Bowel: The stomach, small bowel, and large bowel are unremarkable. The appendix is not visualized and is likely absent. No free intraperitoneal gas or fluid. Vascular/Lymphatic: Mild aortoiliac atherosclerotic calcification. Advanced atherosclerotic calcification is seen within the visualized lower extremity arterial outflow. No aortic aneurysm. No pathologic adenopathy within the abdomen and pelvis. Reproductive: Prostate is unremarkable. Other: Tiny fat containing umbilical hernia.  Rectum unremarkable. Musculoskeletal: No acute bone abnormality. No lytic or blastic bone lesions are seen. IMPRESSION: Acute cholecystitis.  Surgical consultation is advised. Extensive coronary artery calcification.  Mild global cardiomegaly. Aortic Atherosclerosis (ICD10-I70.0). Electronically Signed   By: AFidela SalisburyMD   On: 12/28/2020 23:50   MR ABDOMEN MRCP WO CONTRAST  Result Date: 12/29/2020 CLINICAL DATA:  Biliary obstruction, choledocholithiasis, altered mental status, acute cholecystitis, status post percutaneous cholecystostomy EXAM: MRI ABDOMEN WITHOUT CONTRAST  (INCLUDING MRCP) TECHNIQUE: Multiplanar multisequence MR imaging of the abdomen was performed. Heavily T2-weighted images of the biliary and pancreatic ducts were obtained, and three-dimensional MRCP images were rendered by post processing. COMPARISON:  CT abdomen pelvis, 12/28/2020, percutaneous cholecystostomy, 12/29/2020 FINDINGS: Lower chest: Small bilateral pleural effusions and associated atelectasis or consolidation. Cardiomegaly. Hepatobiliary: Mild hepatic steatosis. No mass or other parenchymal abnormality identified. The gallbladder is distended, with wall thickening and pericholecystic fluid, containing numerous tiny gallstones. Percutaneous  cholecystostomy tube is in place, with formed pigtail in the gallbladder fundus. There is a small calculus in the distal common bile duct measuring 4 mm (series 8, image 27). No biliary ductal dilatation. Mild periportal edema. Pancreas: No mass, inflammatory changes, or other parenchymal abnormality identified. Spleen:  Splenomegaly, maximum coronal span 15.5 cm. Adrenals/Urinary Tract: No masses identified. No evidence of hydronephrosis. Stomach/Bowel: Visualized portions within the abdomen are unremarkable. Vascular/Lymphatic: No pathologically enlarged lymph nodes identified. No abdominal aortic aneurysm demonstrated. Other:  Small volume perihepatic ascites. Musculoskeletal: No suspicious bone lesions identified. IMPRESSION: 1. Percutaneous cholecystostomy tube is in place, with formed pigtail in the gallbladder fundus. 2. The gallbladder is distended, with wall thickening and pericholecystic fluid, containing numerous tiny gallstones. Findings are consistent with acute cholecystitis. 3. There is a 4 mm calculus in the distal common bile duct within the pancreatic head. No biliary ductal dilatation. 4. Mild hepatic steatosis. 5. Splenomegaly. 6. Small volume perihepatic ascites. 7. Small bilateral pleural effusions and associated atelectasis or consolidation. 8. Cardiomegaly. Electronically Signed   By: ADorna BloomD.  On: 12/29/2020 13:40   IR Perc Cholecystostomy  Result Date: 12/29/2020 Michaelle Birks, MD     12/29/2020 11:57 AM [facility]iology Procedure Note Patient: Ronith Raper DOB: Sep 30, 1955 Medical Record Number: [id number] Note Date/Time: 12/29/20 11:56 AM Performing Physician: Michaelle Birks, MD Assistant(s): Heide Guile, RT Diagnosis: Acute Cholecystitis Procedure: CHOLECYSTOSTOMY TUBE PLACEMENT Anesthesia: Conscious Sedation Complications: None Estimated Blood Loss: Minimal Specimens:  None Findings: Successful placement of 54F cholecystostomy tube. Plan: Flush tube w 5 mL  sterile NS and record drain output qShift. Follow up for routine tube evaluation in 6 week(s). See detailed procedure note with images in PACS. The patient tolerated the procedure well without incident or complication and was returned to Floor Bed in stable condition. Michaelle Birks, MD [facility] Radiology Pager. [phone] Clinic. [phone]   DG CHEST PORT 1 VIEW  Result Date: 12/29/2020 CLINICAL DATA:  Hypoxia EXAM: PORTABLE CHEST 1 VIEW COMPARISON:  Chest x-ray 12/28/2020, CT abdomen pelvis 12/28/2020 FINDINGS: Enlarged cardiac silhouette. The heart size and mediastinal contours are unchanged. Low lung volumes with bibasilar streaky airspace opacities likely representing atelectasis. Elevated right hemidiaphragm no focal consolidation. No pulmonary edema. Trace right pleural effusion. No left pleural effusion. No pneumothorax. No acute osseous abnormality. IMPRESSION: 1. Trace right pleural effusion. 2. Low lung volumes. 3. Elevated right hemidiaphragm. Electronically Signed   By: Iven Finn M.D.   On: 12/29/2020 03:59   DG Chest Portable 1 View  Result Date: 12/28/2020 CLINICAL DATA:  Tachycardia. EXAM: PORTABLE CHEST 1 VIEW COMPARISON:  04/11/2020 FINDINGS: Low lung volumes persist. Stable heart size, likely accentuated by technique and low lung volumes. Unchanged mediastinal contours. Streaky bibasilar atelectasis or scarring. No pulmonary edema, pleural effusion or pneumothorax. Patient's chin partially obscures the right lung apex. No acute osseous abnormalities are seen. IMPRESSION: Low lung volumes with streaky bibasilar atelectasis or scarring. Electronically Signed   By: Keith Rake M.D.   On: 12/28/2020 22:58   US Abdomen Limited RUQ (LIVER/GB)  Result Date: 12/28/2020 CLINICAL DATA:  Abdominal pain. EXAM: ULTRASOUND ABDOMEN LIMITED RIGHT UPPER QUADRANT COMPARISON:  CT abdomen pelvis 04/11/2020 FINDINGS: Gallbladder: Abnormal  gallbladder markedly limited evaluation - question of a heterogeneous structure distending the gallbladder. Common bile duct: Diameter: 4 mm Liver: Markedly limited evaluation. Unable to evaluate for focal hepatic lesion. Normal parenchymal echogenicity. Portal vein is patent on color Doppler imaging with normal direction of blood flow towards the liver. Other: None. IMPRESSION: 1. Abnormal gallbladder with markedly limited/nondiagnostic evaluation - question of a heterogeneous structure distending the gallbladder. Recommend CT abdomen pelvis with intravenous contrast for further evaluation. 2. Markedly limited evaluation of the liver due to decreased penetration of the sonographic waves. Electronically Signed   By: Iven Finn M.D.   On: 12/28/2020 22:45     Impression / Plan:   Impression: 1.  Acute cholecystitis with choledocholithiasis: Patient now status post PERC drain placement by IR this morning 12/29/2020, MRCP showing 4 mm calculus in the distal common bile duct 2.  Sepsis: From above 3.  Acute renal failure on CKD stage III 4.  COPD 5.  Paroxysmal A. fib: On warfarin, INR subtherapeutic at 1.3-currently on heparin drip 6.  Encephalopathy  Plan: 1.  Plan is for ERCP tomorrow with Dr. Fuller Plan at 3:30 PM.  Did call and speak with patient's sister Arthas Vasil, who gave consent for IR procedure earlier today, given patient's altered mental status and she agrees to proceed.  She will need to be called  when consent is completed.  Her number is in the chart. 2.  Patient to remain n.p.o. for now given altered mental status. 3.  Agree with antibiotics 4.  Continue to monitor LFTs 5.  Appreciate interventional radiology's recommendations in regards to drain care 6.  Patient's heparin will need to be held 6 hours prior to time of procedure making this 9:30 AM tomorrow.  I have placed nursing care orders as well as altered the heparin order for pharmacy.  Thank you for your kind consultation, we  will continue to follow.  Lavone Nian Dashon Mcintire  12/29/2020, 2:20 PM

## 2020-12-29 NOTE — Consult Note (Signed)
Surgical Evaluation Requesting provider: Doretha Sou MD (resident)  Chief Complaint: altered mental status  HPI: History taken from chart review as patient is obtunded unable to provide. 65 year old with multiple significant medical problems as listed below including CKD, A. fib, COPD with chronic respiratory failure/history of oxygen requirement but not for the last several months, diabetes complication, coronary artery disease, history of stroke, history of DVT who presents from a skilled nursing facility due to new onset hypoxia with saturations in the mid 80s on room air as well as altered mental status-reportedly at baseline he is conversational but currently he is able to say his name but is otherwise not interactive.  Symptoms of confusion have been going on for about a week.  On arrival he was noted to have right upper quadrant abdominal tenderness and imaging as below was concerning for acute cholecystitis.  Allergies  Allergen Reactions   Codeine     Past Medical History:  Diagnosis Date   Absence epileptic syndrome, not intractable, without status epilepticus ([place]) 07/23/2019   Acute renal failure superimposed on stage 3 chronic kidney disease ([place]) 07/23/2019   Atherosclerotic heart disease 07/23/2019   Atrial fibrillation with RVR ([place]) 12/14/2019   Benign enlargement of prostate 07/23/2019   Body mass index 50.0-59.9, adult ([place]) 07/23/2019   Cellulitis of leg 12/14/2019   Chronic respiratory failure with hypoxia ([place]) 07/23/2019   Chronic venous hypertension (idiopathic) with ulcer of left lower extremity ([place]) 07/23/2019   COPD (chronic obstructive pulmonary disease) ([place]) 07/23/2019   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   Diabetic neuropathy ([place]) 07/23/2019   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[place]) 10/15/2019   Epilepsy, grand mal ([place])    Essential hypertension 01/22/2018   Frozen shoulder    Right Shoulder   Gout attack 10/06/2019   H/O acute myocardial infarction 01/22/2018   H/O:  stroke 01/22/2018   History of DVT (deep vein thrombosis) 01/22/2018   Incontinence without sensory awareness 07/23/2019   Long term (current) use of anticoagulants 07/23/2019   Lumbago    Lymphedema of both lower extremities 01/22/2018   Mixed hyperlipidemia 07/23/2019   Morbid obesity ([place]) 07/23/2019   Obstructive sleep apnea 07/23/2019   Paroxysmal atrial fibrillation ([place]) 01/07/2020   Peripheral vascular disease (HCC)    Peripheral vascular disease, unspecified ([place]) 07/12/2011   Secondary hypercoagulable state ([place]) 01/07/2020   Shoulder bursitis 07/25/2019   Stroke [facility])    Urinary incontinence 07/25/2019    Past Surgical History:  Procedure Laterality Date   CORONARY STENT PLACEMENT      Family History  Problem Relation Age of Onset   Cancer Mother    Stroke Father    Hypertension Other    Stroke Other    Hyperlipidemia Other    Alcohol abuse Other    Heart disease Other    Cancer Other     Social History   Socioeconomic History   Marital status: Divorced    Spouse name: Not on file   Number of children: 2   Years of education: Not on file   Highest education level: Not on file  Occupational History   Occupation: Disability  Tobacco Use   Smoking status: Former    Types: Cigarettes    Quit date: 10/28/2009    Years since quitting: 11.1   Smokeless tobacco: Never  Substance and Sexual Activity   Alcohol use: Not Currently    Comment: occassionally   Drug use: No   Sexual activity: Not Currently  Other Topics Concern   Not  on file  Social History Narrative   Not on file   Social Determinants of Health   Financial Resource Strain: Not on file  Food Insecurity: Not on file  Transportation Needs: Not on file  Physical Activity: Not on file  Stress: Not on file  Social Connections: Not on file    No current facility-administered medications on file prior to encounter.   Current Outpatient Medications on File Prior to Encounter  Medication Sig Dispense  Refill   albuterol (PROVENTIL) (2.5 MG/3ML) 0.083% nebulizer solution Inhale 3 mLs (2.5 mg total) into the lungs every 6 (six) hours as needed for wheezing or shortness of breath. 75 mL 2   allopurinol (ZYLOPRIM) 300 MG tablet Take 1 tablet (300 mg total) by mouth daily. 90 tablet 1   carvedilol (COREG) 3.125 MG tablet Take 1 tablet (3.125 mg total) by mouth 2 (two) times daily with a meal. 60 tablet 6   diltiazem (CARDIZEM) 60 MG tablet Take 1 tablet (60 mg total) by mouth 2 (two) times daily. 60 tablet 6   EPINEPHrine 0.3 mg/0.3 mL IJ SOAJ injection Inject 0.3 mg into the muscle as needed for anaphylaxis. 1 each 2   fluticasone (FLONASE) 50 MCG/ACT nasal spray Place 1 spray into both nostrils daily. 18.2 mL 2   gabapentin (NEURONTIN) 300 MG capsule Take 1 capsule (300 mg total) by mouth at bedtime. 90 capsule 2   glucose blood test strip 1 each by Other route as needed. Use as instructed 100 each 2   isosorbide mononitrate (IMDUR) 120 MG 24 hr tablet Take 1 tablet (120 mg total) by mouth daily. 90 tablet 2   metFORMIN (GLUCOPHAGE) 1000 MG tablet Take 1,000 mg by mouth 2 (two) times daily with a meal.     phenytoin (DILANTIN) 300 MG ER capsule Take 1 capsule (300 mg total) by mouth 2 (two) times daily. Patient currently on 100 mg 2 times a day 180 capsule 2   polyethylene glycol powder (MIRALAX) 17 GM/SCOOP powder Take 1 Container by mouth once.     pravastatin (PRAVACHOL) 40 MG tablet Take 1 tablet (40 mg total) by mouth daily. 90 tablet 2   torsemide (DEMADEX) 20 MG tablet Take 20 mg by mouth 2 (two) times daily.     TOVIAZ 4 MG TB24 tablet TAKE 1 TABLET EVERY DAY 90 tablet 2   triamcinolone cream (KENALOG) 0.1 % APPLY TO THE AFFECTED AREA(S) TWICE DAILY AS NEEDED 454 g 2   warfarin (COUMADIN) 2 MG tablet Take 1 tablet (2 mg total) by mouth 3 (three) times a week. Monday, Wednesday and Friday 30 tablet 3   warfarin (COUMADIN) 4 MG tablet Take 4 mg by mouth daily. Saturday, Sunday,tuesday and  thursday      Review of Systems: a complete, 10pt review of systems was unable to be completed due to altered mental status  Physical Exam: Vitals:   12/28/20 2345 12/28/20 2345  BP: 132/74   Pulse: (!) 102   Resp: (!) 23   Temp:  98.3 F (36.8 C)  SpO2: 100%    Gen: Somnolent, chronically ill-appearing Eyes: lids and conjunctivae normal, no icterus. Pupils equally round and reactive to light.  Neck: supple without mass or thyromegaly Chest: respiratory effort is normal. No crepitus or tenderness on palpation of the chest. Breath sounds equal.  Cardiovascular: RRR with palpable distal pulses, chronic appearing right lower extremity edema with skin changes Gastrointestinal: soft, nondistended, appears tender to palpation in the right upper quadrant without  guarding or peritoneal signs.  No palpable mass. Lymphatic: no lymphadenopathy in the neck or groin Muscoloskeletal: Unable to assess strength due to mental status.  No gross deformity Neuro: Not answering questions or conversant, grimaces to pain Psych: Unable to assess due to mental status Skin: warm and dry   CBC Latest Ref Rng & Units 12/28/2020 03/26/2020 03/12/2020  WBC 4.0 - 10.5 K/uL 20.4(H) 11.7(H) 12.5(H)  Hemoglobin 13.0 - 17.0 g/dL 11.9(L) 9.2(L) 8.8(L)  Hematocrit 39.0 - 52.0 % 38.6(L) 28.6(L) 26.9(L)  Platelets 150 - 400 K/uL 198 242 203    CMP Latest Ref Rng & Units 12/28/2020 03/26/2020 03/12/2020  Glucose 70 - 99 mg/dL 248(H) 184(H) 130(H)  BUN 8 - 23 mg/dL 32(H) 59(H) 31(H)  Creatinine 0.61 - 1.24 mg/dL 1.92(H) 1.57(H) 1.70(H)  Sodium 135 - 145 mmol/L 131(L) 137 136  Potassium 3.5 - 5.1 mmol/L 3.8 5.0 5.0  Chloride 98 - 111 mmol/L 89(L) 94(L) 93(L)  CO2 22 - 32 mmol/L 27 23 27   Calcium 8.9 - 10.3 mg/dL 9.2 9.3 8.8  Total Protein 6.5 - 8.1 g/dL 7.6 8.1 7.3  Total Bilirubin 0.3 - 1.2 mg/dL 5.2(H) 0.4 <0.2  Alkaline Phos 38 - 126 U/L 267(H) 154(H) 143(H)  AST 15 - 41 U/L 54(H) 16 13  ALT 0 - 44 U/L 24 30 17      Lab Results  Component Value Date   INR 1.3 (H) 12/28/2020   INR 3.2 (H) 03/26/2020   INR 3.6 (H) 03/12/2020    Imaging: CT HEAD WO CONTRAST (5MM)  Result Date: 12/28/2020 CLINICAL DATA:  Mental status change, unknown cause altered mental status. EXAM: CT HEAD WITHOUT CONTRAST TECHNIQUE: Contiguous axial images were obtained from the base of the skull through the vertex without intravenous contrast. COMPARISON:  None. FINDINGS: Brain: Normal anatomic configuration. There is moderate parenchymal volume loss, slightly advanced given the patient's age. Remote lacunar infarcts are noted within the left caudate nucleus, left insular cortex, and right thalamus. Periventricular white matter changes are present likely reflecting the sequela of small vessel ischemia. No abnormal intra or extra-axial mass lesion or fluid collection. No abnormal mass effect or midline shift. No evidence of acute intracranial hemorrhage or infarct. Ventricular size is normal. Cerebellum unremarkable. Vascular: No asymmetric hyperdense vasculature at the skull base. Skull: Intact Sinuses/Orbits: Paranasal sinuses are clear. Orbits are unremarkable. Other: Mastoid air cells and middle ear cavities are clear. IMPRESSION: No acute intracranial abnormality. Advanced senescent change. Multiple remote infarcts as noted above. Electronically Signed   By: Fidela Salisbury MD   On: 12/28/2020 23:45   CT ABDOMEN PELVIS W CONTRAST  Result Date: 12/28/2020 CLINICAL DATA:  Abdominal pain, biliary obstruction suspected (Ped 0-18y) EXAM: CT ABDOMEN AND PELVIS WITH CONTRAST TECHNIQUE: Multidetector CT imaging of the abdomen and pelvis was performed using the standard protocol following bolus administration of intravenous contrast. CONTRAST:  17m OMNIPAQUE IOHEXOL 300 MG/ML  SOLN COMPARISON:  04/11/2020 FINDINGS: Lower chest: Small right pleural effusion noted at the visualized right lung base with right basilar atelectasis. Mild left basilar  atelectasis. Extensive multi-vessel coronary artery calcification with probable stenting of the left anterior descending coronary artery. Cardiac size is mildly enlarged. No pericardial effusion. Hepatobiliary: The gallbladder is distended, the gallbladder wall is thickened, and there is extensive pericholecystic inflammatory change in keeping with changes of acute cholecystitis. The extrahepatic bile duct is not dilated. There is no intrahepatic biliary ductal dilation. The liver is unremarkable. Pancreas: Unremarkable Spleen: Unremarkable Adrenals/Urinary Tract: Adrenal glands are  unremarkable. Kidneys are normal, without renal calculi, focal lesion, or hydronephrosis. Bladder is unremarkable. Stomach/Bowel: The stomach, small bowel, and large bowel are unremarkable. The appendix is not visualized and is likely absent. No free intraperitoneal gas or fluid. Vascular/Lymphatic: Mild aortoiliac atherosclerotic calcification. Advanced atherosclerotic calcification is seen within the visualized lower extremity arterial outflow. No aortic aneurysm. No pathologic adenopathy within the abdomen and pelvis. Reproductive: Prostate is unremarkable. Other: Tiny fat containing umbilical hernia.  Rectum unremarkable. Musculoskeletal: No acute bone abnormality. No lytic or blastic bone lesions are seen. IMPRESSION: Acute cholecystitis.  Surgical consultation is advised. Extensive coronary artery calcification.  Mild global cardiomegaly. Aortic Atherosclerosis (ICD10-I70.0). Electronically Signed   By: Fidela Salisbury MD   On: 12/28/2020 23:50   DG Chest Portable 1 View  Result Date: 12/28/2020 CLINICAL DATA:  Tachycardia. EXAM: PORTABLE CHEST 1 VIEW COMPARISON:  04/11/2020 FINDINGS: Low lung volumes persist. Stable heart size, likely accentuated by technique and low lung volumes. Unchanged mediastinal contours. Streaky bibasilar atelectasis or scarring. No pulmonary edema, pleural effusion or pneumothorax. Patient's chin  partially obscures the right lung apex. No acute osseous abnormalities are seen. IMPRESSION: Low lung volumes with streaky bibasilar atelectasis or scarring. Electronically Signed   By: Keith Rake M.D.   On: 12/28/2020 22:58   US Abdomen Limited RUQ (LIVER/GB)  Result Date: 12/28/2020 CLINICAL DATA:  Abdominal pain. EXAM: ULTRASOUND ABDOMEN LIMITED RIGHT UPPER QUADRANT COMPARISON:  CT abdomen pelvis 04/11/2020 FINDINGS: Gallbladder: Abnormal gallbladder markedly limited evaluation - question of a heterogeneous structure distending the gallbladder. Common bile duct: Diameter: 4 mm Liver: Markedly limited evaluation. Unable to evaluate for focal hepatic lesion. Normal parenchymal echogenicity. Portal vein is patent on color Doppler imaging with normal direction of blood flow towards the liver. Other: None. IMPRESSION: 1. Abnormal gallbladder with markedly limited/nondiagnostic evaluation - question of a heterogeneous structure distending the gallbladder. Recommend CT abdomen pelvis with intravenous contrast for further evaluation. 2. Markedly limited evaluation of the liver due to decreased penetration of the sonographic waves. Electronically Signed   By: Iven Finn M.D.   On: 12/28/2020 22:45     A/P: 65 year old gentleman with multiple severe medical problems presents from skilled nursing facility with altered mental status and new oxygen requirement, the latter is improved with nasal cannula.  He was febrile on presentation with mild tachycardia, normotensive, labs notable for hyponatremia, hyperglycemia, acute kidney injury with creatinine of 1.92 (baseline unknown), elevated LFTs with alk phos of 267, mildly elevated AST, and a bilirubin of 5.2.  Lactate elevated to 2.5 and white count elevated at 20.4.  Abnormal right upper quadrant ultrasound which is nondiagnostic followed by CT of the abdomen and pelvis noting a small right pleural effusion, distended gallbladder with gallbladder wall  thickening and pericholecystic inflammation concerning for cholecystitis.  Incidentally noted extensive coronary artery calcification and mild global cardiomegaly.  On chart review it seems as though he is on Coumadin, but his INR is 1.3.  -Given hyperbilirubinemia and sepsis, recommend MRCP to evaluate for biliary obstruction/choledocholithiasis.  GI consult if MRCP positive. -Keep n.p.o., fluid resuscitation, empiric broad-spectrum IV antibiotics -Pending results of MRCP and resuscitation, will follow for hopeful laparoscopic cholecystectomy this admission.  If he clinically decompensates despite above measures, would recommend IR evaluation for percutaneous cholecystostomy tube.     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/15/2019   Gout attack 10/06/2019   Urinary incontinence 07/25/2019   Shoulder bursitis 07/25/2019   Acute renal failure superimposed on stage 3 chronic kidney disease ([place]) 07/23/2019   Mixed hyperlipidemia 07/23/2019   Absence epileptic syndrome, not intractable, without status epilepticus ([place]) 07/23/2019   Atherosclerotic heart disease 07/23/2019   Diabetic neuropathy ([place]) 07/23/2019   Benign enlargement of prostate 07/23/2019   COPD (chronic obstructive pulmonary disease) ([place]) 07/23/2019   Long term (current) use of anticoagulants 07/23/2019   Morbid obesity ([place]) 07/23/2019   Chronic venous hypertension (idiopathic) with ulcer of left lower extremity ([place]) 07/23/2019   Obstructive sleep apnea 07/23/2019    Incontinence without sensory awareness 07/23/2019   Chronic respiratory failure ([place]) 07/23/2019   Body mass index 50.0-59.9, adult ([place]) 07/23/2019   H/O acute myocardial infarction 01/22/2018   Essential hypertension 01/22/2018   H/O: stroke 01/22/2018   History of DVT (deep vein thrombosis) 01/22/2018   Lymphedema of both lower extremities 01/22/2018   Peripheral vascular disease, unspecified ([place]) 07/12/2011       Romana Juniper, MD [facility]  See AMION to contact appropriate on-call provider

## 2020-12-29 NOTE — ED Notes (Signed)
resp at bedside

## 2020-12-29 NOTE — Consult Note (Signed)
Consultation  Referring Provider: Dr. Starla Link     Primary Care Physician:  Lillard Anes, MD Primary Gastroenterologist: Althia Forts        Reason for Consultation: Choledocholithiasis             HPI:   Matthew Gutierrez is a 65 y.o. male with a past medical history significant for diabetes type 2, CKD stage III, A. fib, COPD, neuropathy due to diabetes, history of stroke, OSA, PVD and others listed below, who presented to the hospital with altered mental status and hypoxia.  We are consulted in regards to a finding of choledocholithiasis.    Upon admission to the hospital it was reported the patient is normally conversational at his baseline in the evening of December 28, 2020 patient was found to be hypoxic with an oxygenation sat in the mid 80s on room air and had altered mental status and minimally said his name but would not interaction with airway.    During time of my interview patient is nonconversational.  Per nursing staff his mental status has not changed since time of admission to the hospital.  History is garnered from previous physicians notes.  Did call and speak with his sister who did not have a lot of details about his current condition but is the person in charge of his care here.  ED course: Temp of 101.4, found to be septic with elevated lactic acid, encephalopathy, tachycardia, tachypnea and imaging revealing acute cholecystitis, elevated bili, surgery was consulted and recommended MRCP in light of his clinical situation, lab work revealed sodium of 131, potassium 3.8, chloride 89, bicarb 27, glucose 248, creatinine 1.92 (baseline 1.5), AST 54, ALT 24, bili 5.2, lactic acid 2.2 with a repeat of 2.5, WBC is 20,400, hemoglobin 11.9, platelets 190,000  Past Medical History:  Diagnosis Date   Absence epileptic syndrome, not intractable, without status epilepticus ([place]) 07/23/2019   Acute renal failure superimposed on stage 3 chronic kidney disease ([place]) 07/23/2019    Atherosclerotic heart disease 07/23/2019   Atrial fibrillation with RVR ([place]) 12/14/2019   Benign enlargement of prostate 07/23/2019   Body mass index 50.0-59.9, adult ([place]) 07/23/2019   Cellulitis of leg 12/14/2019   Chronic respiratory failure with hypoxia ([place]) 07/23/2019   Chronic venous hypertension (idiopathic) with ulcer of left lower extremity ([place]) 07/23/2019   COPD (chronic obstructive pulmonary disease) ([place]) 07/23/2019   Diabetic glomerulopathy ([place]) 10/15/2019   Diabetic neuropathy ([place]) 07/23/2019   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[place]) 10/15/2019   Epilepsy, grand mal ([place])    Essential hypertension 01/22/2018   Frozen shoulder    Right Shoulder   Gout attack 10/06/2019   H/O acute myocardial infarction 01/22/2018   H/O: stroke 01/22/2018   History of DVT (deep vein thrombosis) 01/22/2018   Incontinence without sensory awareness 07/23/2019   Long term (current) use of anticoagulants 07/23/2019   Lumbago    Lymphedema of both lower extremities 01/22/2018   Mixed hyperlipidemia 07/23/2019   Morbid obesity ([place]) 07/23/2019   Obstructive sleep apnea 07/23/2019   Paroxysmal atrial fibrillation ([place]) 01/07/2020   Peripheral vascular disease ([place])    Peripheral vascular disease, unspecified ([place]) 07/12/2011   Secondary hypercoagulable state ([place]) 01/07/2020   Shoulder bursitis 07/25/2019   Stroke ([place])    Urinary incontinence 07/25/2019    Past Surgical History:  Procedure Laterality Date   CORONARY STENT PLACEMENT     IR PERC CHOLECYSTOSTOMY  12/29/2020    Family History  Problem Relation Age of Onset  Cancer Mother    Stroke Father    Hypertension Other    Stroke Other    Hyperlipidemia Other    Alcohol abuse Other    Heart disease Other    Cancer Other     Social History   Tobacco Use   Smoking status: Former    Types: Cigarettes    Quit date: 10/28/2009    Years since quitting: 11.1   Smokeless tobacco: Never  Substance Use Topics   Alcohol use: Not Currently    Comment:  occassionally   Drug use: No    Prior to Admission medications   Medication Sig Start Date End Date Taking? Authorizing Provider  albuterol (PROVENTIL) (2.5 MG/3ML) 0.083% nebulizer solution Inhale 3 mLs (2.5 mg total) into the lungs every 6 (six) hours as needed for wheezing or shortness of breath. 03/18/20  Yes Lillard Anes, MD  aspirin 325 MG tablet Take 325 mg by mouth daily.   Yes [provider]  atorvastatin (LIPITOR) 10 MG tablet Take 10 mg by mouth daily.   Yes [provider]  Cholecalciferol (VITAMIN D3) 1.25 MG (50000 UT) TABS Take 50,000 Units by mouth 2 (two) times a week. Every Wed and Sat   Yes [provider]  divalproex (DEPAKOTE) 125 MG DR tablet Take 125 mg by mouth 2 (two) times daily.   Yes [provider]  divalproex (DEPAKOTE) 500 MG DR tablet Take 500 mg by mouth 2 (two) times daily.   Yes [provider]  Dulaglutide (TRULICITY) A999333 [id number] SOPN Inject 0.75 mg into the skin every Tuesday.   Yes [provider]  DULoxetine (CYMBALTA) 60 MG capsule Take 60 mg by mouth daily.   Yes [provider]  EPINEPHrine 0.3 mg/0.3 mL IJ SOAJ injection Inject 0.3 mg into the muscle as needed for anaphylaxis. 03/18/20  Yes Lillard Anes, MD  fluticasone [facility]) 50 MCG/ACT nasal spray Place 1 spray into both nostrils daily. Patient taking differently: Place 1 spray into both nostrils daily as needed for allergies. 10/30/19  Yes Lillard Anes, MD  gabapentin (NEURONTIN) 300 MG capsule Take 1 capsule (300 mg total) by mouth at bedtime. 11/18/19  Yes Lillard Anes, MD  glucose blood test strip 1 each by Other route as needed. Use as instructed 03/17/20  Yes Lillard Anes, MD  isosorbide mononitrate (IMDUR) 120 MG 24 hr tablet Take 1 tablet (120 mg total) by mouth daily. 01/16/20  Yes Lillard Anes, MD  metoprolol succinate (TOPROL-XL) 25 MG 24 hr tablet Take 25 mg by mouth  daily.   Yes [provider]  polyethylene glycol powder (GLYCOLAX/MIRALAX) 17 GM/SCOOP powder Take 1 Container by mouth every morning.   Yes [provider]  potassium chloride (KLOR-CON) 10 MEQ tablet Take 10 mEq by mouth daily.   Yes [provider]  risperiDONE (RISPERDAL) 1 MG tablet Take 1 mg by mouth 2 (two) times daily.   Yes [provider]  torsemide (DEMADEX) 20 MG tablet Take 20 mg by mouth daily.   Yes [provider]  allopurinol (ZYLOPRIM) 300 MG tablet Take 1 tablet (300 mg total) by mouth daily. Patient not taking: No sig reported 03/18/20   Lillard Anes, MD  carvedilol (COREG) 3.125 MG tablet Take 1 tablet (3.125 mg total) by mouth 2 (two) times daily with a meal. Patient not taking: No sig reported 02/05/20   Lillard Anes, MD  diltiazem (CARDIZEM) 60 MG tablet Take 1 tablet (  60 mg total) by mouth 2 (two) times daily. Patient not taking: No sig reported 02/05/20   Lillard Anes, MD  metFORMIN (GLUCOPHAGE) 1000 MG tablet Take 1,000 mg by mouth 2 (two) times daily with a meal. Patient not taking: No sig reported    [provider]  phenytoin (DILANTIN) 300 MG ER capsule Take 1 capsule (300 mg total) by mouth 2 (two) times daily. Patient currently on 100 mg 2 times a day Patient not taking: No sig reported 01/16/20   Lillard Anes, MD  pravastatin (PRAVACHOL) 40 MG tablet Take 1 tablet (40 mg total) by mouth daily. Patient not taking: No sig reported 11/18/19   Lillard Anes, MD  TOVIAZ 4 MG TB24 tablet TAKE 1 TABLET EVERY DAY Patient not taking: No sig reported 03/25/20   Lillard Anes, MD  triamcinolone cream (KENALOG) 0.1 % APPLY TO THE AFFECTED AREA(S) TWICE DAILY AS NEEDED Patient not taking: No sig reported 11/16/19   Lillard Anes, MD  warfarin (COUMADIN) 2 MG tablet Take 1 tablet (2 mg total) by mouth 3 (three) times a week. Monday, Wednesday and Friday Patient  not taking: No sig reported 03/18/20   Lillard Anes, MD  warfarin (COUMADIN) 4 MG tablet Take 4 mg by mouth daily. Saturday, Sunday,tuesday and thursday Patient not taking: No sig reported    [provider]    Current Facility-Administered Medications  Medication Dose Route Frequency Provider Last Rate Last Admin   acetaminophen (TYLENOL) tablet 650 mg  650 mg Oral Q6H PRN Chotiner, Yevonne Aline, MD       Or   acetaminophen (TYLENOL) suppository 650 mg  650 mg Rectal Q6H PRN Chotiner, Yevonne Aline, MD       albuterol (PROVENTIL) (2.5 MG/3ML) 0.083% nebulizer solution 2.5 mg  2.5 mg Inhalation Q4H PRN Chotiner, Yevonne Aline, MD       ceFEPIme (MAXIPIME) 2 g in sodium chloride 0.9 % 100 mL IVPB  2 g Intravenous Q12H Erenest Blank, RPH   Stopped at 12/29/20 1000   gabapentin (NEURONTIN) capsule 300 mg  300 mg Oral QHS Chotiner, Yevonne Aline, MD       heparin ADULT infusion 100 units/mL (25000 units/261m)  1,400 Units/hr Intravenous Continuous Alekh, Kshitiz, MD       HYDROmorphone (DILAUDID) injection 1 mg  1 mg Intravenous Q3H PRN Chotiner, BYevonne Aline MD       insulin aspart (novoLOG) injection 0-9 Units  0-9 Units Subcutaneous Q6H PRN Chotiner, BYevonne Aline MD       lactated ringers infusion   Intravenous Continuous Chotiner, BYevonne Aline MD 75 mL/hr at 12/29/20 0912 Infusion Verify at 12/29/20 0912   lidocaine (XYLOCAINE) 1 % (with pres) injection            metroNIDAZOLE (FLAGYL) IVPB 500 mg  500 mg Intravenous Q8H Chotiner, BYevonne Aline MD   Stopped at 12/29/20 1215   ondansetron (ZOFRAN) tablet 4 mg  4 mg Oral Q6H PRN Chotiner, BYevonne Aline MD       Or   ondansetron (ZOFRAN) injection 4 mg  4 mg Intravenous Q6H PRN Chotiner, BYevonne Aline MD       valproate (DEPACON) 625 mg in dextrose 5 % 50 mL IVPB  625 mg Intravenous BID Alekh, Kshitiz, MD       vancomycin (VANCOREADY) IVPB 1250 mg/250 mL  1,250 mg Intravenous Q24H [facility]est Blank [facility]      Current Outpatient Medications   Medication Sig  Dispense Refill   albuterol (PROVENTIL) (2.5 MG/3ML) 0.083% nebulizer solution Inhale 3 mLs (2.5 mg total) into the lungs every 6 (six) hours as needed for wheezing or shortness of breath. 75 mL 2   aspirin 325 MG tablet Take 325 mg by mouth daily.     atorvastatin (LIPITOR) 10 MG tablet Take 10 mg by mouth daily.     Cholecalciferol (VITAMIN D3) 1.25 MG (50000 UT) TABS Take 50,000 Units by mouth 2 (two) times a week. Every Wed and Sat     divalproex (DEPAKOTE) 125 MG DR tablet Take 125 mg by mouth 2 (two) times daily.     divalproex (DEPAKOTE) 500 MG DR tablet Take 500 mg by mouth 2 (two) times daily.     Dulaglutide (TRULICITY) [id number] SOPN Inject 0.75 mg into the skin every Tuesday.     DULoxetine (CYMBALTA) 60 MG capsule Take 60 mg by mouth daily.     EPINEPHrine 0.3 mg/0.3 mL IJ SOAJ injection Inject 0.3 mg into the muscle as needed for anaphylaxis. 1 each 2   fluticasone (FLONASE) 50 MCG/ACT nasal spray Place 1 spray into both nostrils daily. (Patient taking differently: Place 1 spray into both nostrils daily as needed for allergies.) 18.2 mL 2   gabapentin (NEURONTIN) 300 MG capsule Take 1 capsule (300 mg total) by mouth at bedtime. 90 capsule 2   glucose blood test strip 1 each by Other route as needed. Use as instructed 100 each 2   isosorbide mononitrate (IMDUR) 120 MG 24 hr tablet Take 1 tablet (120 mg total) by mouth daily. 90 tablet 2   metoprolol succinate (TOPROL-XL) 25 MG 24 hr tablet Take 25 mg by mouth daily.     polyethylene glycol powder (GLYCOLAX/MIRALAX) 17 GM/SCOOP powder Take 1 Container by mouth every morning.     potassium chloride (KLOR-CON) 10 MEQ tablet Take 10 mEq by mouth daily.     risperiDONE (RISPERDAL) 1 MG tablet Take 1 mg by mouth 2 (two) times daily.     torsemide (DEMADEX) 20 MG tablet Take 20 mg by mouth daily.     allopurinol (ZYLOPRIM) 300 MG tablet Take 1 tablet (300 mg total) by mouth daily. (Patient not taking: No sig reported) 90  tablet 1   carvedilol (COREG) 3.125 MG tablet Take 1 tablet (3.125 mg total) by mouth 2 (two) times daily with a meal. (Patient not taking: No sig reported) 60 tablet 6   diltiazem (CARDIZEM) 60 MG tablet Take 1 tablet (60 mg total) by mouth 2 (two) times daily. (Patient not taking: No sig reported) 60 tablet 6   metFORMIN (GLUCOPHAGE) 1000 MG tablet Take 1,000 mg by mouth 2 (two) times daily with a meal. (Patient not taking: No sig reported)     phenytoin (DILANTIN) 300 MG ER capsule Take 1 capsule (300 mg total) by mouth 2 (two) times daily. Patient currently on 100 mg 2 times a day (Patient not taking: No sig reported) 180 capsule 2   pravastatin (PRAVACHOL) 40 MG tablet Take 1 tablet (40 mg total) by mouth daily. (Patient not taking: No sig reported) 90 tablet 2   TOVIAZ 4 MG TB24 tablet TAKE 1 TABLET EVERY DAY (Patient not taking: No sig reported) 90 tablet 2   triamcinolone cream (KENALOG) 0.1 % APPLY TO THE AFFECTED AREA(S) TWICE DAILY AS NEEDED (Patient not taking: No sig reported) 454 g 2   warfarin (COUMADIN) 2 MG tablet Take 1 tablet (2 mg total) by mouth 3 (three) times a  week. Monday, Wednesday and Friday (Patient not taking: No sig reported) 30 tablet 3   warfarin (COUMADIN) 4 MG tablet Take 4 mg by mouth daily. Saturday, Sunday,tuesday and thursday (Patient not taking: No sig reported)      Allergies as of 12/28/2020 - Review Complete 03/26/2020  Allergen Reaction Noted   Codeine  07/08/2011     Review of Systems:    Unable to obtain due to mental status    Physical Exam:  Vital signs in last 24 hours: Temp:  [98.3 F (36.8 C)-101.4 F (38.6 C)] 98.3 F (36.8 C) (08/01 2345) Pulse Rate:  [84-110] 100 (08/02 1130) Resp:  [16-37] 16 (08/02 1130) BP: (112-163)/(67-96) 131/77 (08/02 1130) SpO2:  [86 %-100 %] 99 % (08/02 1130) Weight:  [130.6 kg] 130.6 kg (08/01 2232)   General:   Ill-appearing, jaundiced, overweight Caucasian male, Well developed, Well nourished, slightly  awakens to voice but does not answer questions or follow commands Head:  Normocephalic and atraumatic. Eyes:   PEERL, EOMI. Icteric, Conjunctiva pink. Ears:  Normal auditory acuity. Neck:  Supple Throat: Oral cavity and pharynx without inflammation, swelling or lesion. Lungs: Respirations even and unlabored. Lungs clear to auscultation bilaterally.   No wheezes, crackles, or rhonchi.  Heart: Normal S1, S2. No MRG. Regular rate and rhythm. No peripheral edema, cyanosis or pallor.  Abdomen: Soft nondistended, grimacing to right upper quadrant palpation, normal bowel sounds. No appreciable masses or hepatomegaly. Rectal:  Not performed.  Msk:  Symmetrical without gross deformities. Peripheral pulses intact.  Extremities:  Without edema, no deformity or joint abnormality.  Neurologic: Obtunded, awakens to voice slightly, unable to answer questions or follow commands Skin:   + Petechiae to the level of the shin on both legs, unkempt toenails Psychiatric: Obtunded   LAB RESULTS: Recent Labs    12/28/20 2141 12/29/20 0340  WBC 20.4* 16.7*  HGB 11.9* 11.3*  HCT 38.6* 35.5*  PLT 198 165   BMET Recent Labs    12/28/20 2141 12/29/20 0340  NA 131* 135  K 3.8 3.5  CL 89* 94*  CO2 27 30  GLUCOSE 248* 207*  BUN 32* 28*  CREATININE 1.92* 1.77*  CALCIUM 9.2 8.8*   LFT Recent Labs    12/29/20 0340  PROT 6.7  ALBUMIN 1.7*  AST 48*  ALT 23  ALKPHOS 244*  BILITOT 4.6*  BILIDIR 2.8*  IBILI 1.8*   PT/INR Recent Labs    12/28/20 2141 12/29/20 0340  LABPROT 15.8* 16.1*  INR 1.3* 1.3*    STUDIES: CT HEAD WO CONTRAST (5MM)  Result Date: 12/28/2020 CLINICAL DATA:  Mental status change, unknown cause altered mental status. EXAM: CT HEAD WITHOUT CONTRAST TECHNIQUE: Contiguous axial images were obtained from the base of the skull through the vertex without intravenous contrast. COMPARISON:  None. FINDINGS: Brain: Normal anatomic configuration. There is moderate parenchymal volume  loss, slightly advanced given the patient's age. Remote lacunar infarcts are noted within the left caudate nucleus, left insular cortex, and right thalamus. Periventricular white matter changes are present likely reflecting the sequela of small vessel ischemia. No abnormal intra or extra-axial mass lesion or fluid collection. No abnormal mass effect or midline shift. No evidence of acute intracranial hemorrhage or infarct. Ventricular size is normal. Cerebellum unremarkable. Vascular: No asymmetric hyperdense vasculature at the skull base. Skull: Intact Sinuses/Orbits: Paranasal sinuses are clear. Orbits are unremarkable. Other: Mastoid air cells and middle ear cavities are clear. IMPRESSION: No acute intracranial abnormality. Advanced senescent change. Multiple remote infarcts  as noted above. Electronically Signed   By: Fidela Salisbury MD   On: 12/28/2020 23:45   CT ABDOMEN PELVIS W CONTRAST  Result Date: 12/28/2020 CLINICAL DATA:  Abdominal pain, biliary obstruction suspected (Ped 0-18y) EXAM: CT ABDOMEN AND PELVIS WITH CONTRAST TECHNIQUE: Multidetector CT imaging of the abdomen and pelvis was performed using the standard protocol following bolus administration of intravenous contrast. CONTRAST:  136m OMNIPAQUE IOHEXOL 300 MG/ML  SOLN COMPARISON:  04/11/2020 FINDINGS: Lower chest: Small right pleural effusion noted at the visualized right lung base with right basilar atelectasis. Mild left basilar atelectasis. Extensive multi-vessel coronary artery calcification with probable stenting of the left anterior descending coronary artery. Cardiac size is mildly enlarged. No pericardial effusion. Hepatobiliary: The gallbladder is distended, the gallbladder wall is thickened, and there is extensive pericholecystic inflammatory change in keeping with changes of acute cholecystitis. The extrahepatic bile duct is not dilated. There is no intrahepatic biliary ductal dilation. The liver is unremarkable. Pancreas:  Unremarkable Spleen: Unremarkable Adrenals/Urinary Tract: Adrenal glands are unremarkable. Kidneys are normal, without renal calculi, focal lesion, or hydronephrosis. Bladder is unremarkable. Stomach/Bowel: The stomach, small bowel, and large bowel are unremarkable. The appendix is not visualized and is likely absent. No free intraperitoneal gas or fluid. Vascular/Lymphatic: Mild aortoiliac atherosclerotic calcification. Advanced atherosclerotic calcification is seen within the visualized lower extremity arterial outflow. No aortic aneurysm. No pathologic adenopathy within the abdomen and pelvis. Reproductive: Prostate is unremarkable. Other: Tiny fat containing umbilical hernia.  Rectum unremarkable. Musculoskeletal: No acute bone abnormality. No lytic or blastic bone lesions are seen. IMPRESSION: Acute cholecystitis.  Surgical consultation is advised. Extensive coronary artery calcification.  Mild global cardiomegaly. Aortic Atherosclerosis (ICD10-I70.0). Electronically Signed   By: AFidela SalisburyMD   On: 12/28/2020 23:50   MR ABDOMEN MRCP WO CONTRAST  Result Date: 12/29/2020 CLINICAL DATA:  Biliary obstruction, choledocholithiasis, altered mental status, acute cholecystitis, status post percutaneous cholecystostomy EXAM: MRI ABDOMEN WITHOUT CONTRAST  (INCLUDING MRCP) TECHNIQUE: Multiplanar multisequence MR imaging of the abdomen was performed. Heavily T2-weighted images of the biliary and pancreatic ducts were obtained, and three-dimensional MRCP images were rendered by post processing. COMPARISON:  CT abdomen pelvis, 12/28/2020, percutaneous cholecystostomy, 12/29/2020 FINDINGS: Lower chest: Small bilateral pleural effusions and associated atelectasis or consolidation. Cardiomegaly. Hepatobiliary: Mild hepatic steatosis. No mass or other parenchymal abnormality identified. The gallbladder is distended, with wall thickening and pericholecystic fluid, containing numerous tiny gallstones. Percutaneous  cholecystostomy tube is in place, with formed pigtail in the gallbladder fundus. There is a small calculus in the distal common bile duct measuring 4 mm (series 8, image 27). No biliary ductal dilatation. Mild periportal edema. Pancreas: No mass, inflammatory changes, or other parenchymal abnormality identified. Spleen:  Splenomegaly, maximum coronal span 15.5 cm. Adrenals/Urinary Tract: No masses identified. No evidence of hydronephrosis. Stomach/Bowel: Visualized portions within the abdomen are unremarkable. Vascular/Lymphatic: No pathologically enlarged lymph nodes identified. No abdominal aortic aneurysm demonstrated. Other:  Small volume perihepatic ascites. Musculoskeletal: No suspicious bone lesions identified. IMPRESSION: 1. Percutaneous cholecystostomy tube is in place, with formed pigtail in the gallbladder fundus. 2. The gallbladder is distended, with wall thickening and pericholecystic fluid, containing numerous tiny gallstones. Findings are consistent with acute cholecystitis. 3. There is a 4 mm calculus in the distal common bile duct within the pancreatic head. No biliary ductal dilatation. 4. Mild hepatic steatosis. 5. Splenomegaly. 6. Small volume perihepatic ascites. 7. Small bilateral pleural effusions and associated atelectasis or consolidation. 8. Cardiomegaly. Electronically Signed   By: ADorna BloomD.  On: 12/29/2020 13:40   IR Perc Cholecystostomy  Result Date: 12/29/2020 Michaelle Birks, MD     12/29/2020 11:57 AM Vascular and Inter[facility] Radiology Procedure Note Patient: Hensley Eberl DOB: 1955/12/01 Medical Record Number: [id number] Note Date/Time: 12/29/20 11:56 AM Performing Physician: Michaelle Birks, MD Assistant(s): Heide Guile, RT Diagnosis: Acute Cholecystitis Procedure: CHOLECYSTOSTOMY TUBE PLACEMENT Anesthesia: Conscious Sedation Complications: None Estimated Blood Loss: Minimal Specimens:  None Findings: Successful placement of 55F cholecystostomy tube. Plan: Flush tube w 5 mL  sterile NS and record drain output qShift. Follow up for routine tube evaluation in 6 week(s). See detailed procedure note with images in PACS. The patient tolerated the procedure well without incident or complication and was returned to Floor Bed in stable condition. Michaelle Birks, MD [facility] Radiology Pager. [phone] Clinic. [phone]   DG CHEST PORT 1 VIEW  Result Date: 12/29/2020 CLINICAL DATA:  Hypoxia EXAM: PORTABLE CHEST 1 VIEW COMPARISON:  Chest x-ray 12/28/2020, CT abdomen pelvis 12/28/2020 FINDINGS: Enlarged cardiac silhouette. The heart size and mediastinal contours are unchanged. Low lung volumes with bibasilar streaky airspace opacities likely representing atelectasis. Elevated right hemidiaphragm no focal consolidation. No pulmonary edema. Trace right pleural effusion. No left pleural effusion. No pneumothorax. No acute osseous abnormality. IMPRESSION: 1. Trace right pleural effusion. 2. Low lung volumes. 3. Elevated right hemidiaphragm. Electronically Signed   By: Iven Finn M.D.   On: 12/29/2020 03:59   DG Chest Portable 1 View  Result Date: 12/28/2020 CLINICAL DATA:  Tachycardia. EXAM: PORTABLE CHEST 1 VIEW COMPARISON:  04/11/2020 FINDINGS: Low lung volumes persist. Stable heart size, likely accentuated by technique and low lung volumes. Unchanged mediastinal contours. Streaky bibasilar atelectasis or scarring. No pulmonary edema, pleural effusion or pneumothorax. Patient's chin partially obscures the right lung apex. No acute osseous abnormalities are seen. IMPRESSION: Low lung volumes with streaky bibasilar atelectasis or scarring. Electronically Signed   By: Keith Rake M.D.   On: 12/28/2020 22:58   US Abdomen Limited RUQ (LIVER/GB)  Result Date: 12/28/2020 CLINICAL DATA:  Abdominal pain. EXAM: ULTRASOUND ABDOMEN LIMITED RIGHT UPPER QUADRANT COMPARISON:  CT abdomen pelvis 04/11/2020 FINDINGS: Gallbladder: Abnormal  gallbladder markedly limited evaluation - question of a heterogeneous structure distending the gallbladder. Common bile duct: Diameter: 4 mm Liver: Markedly limited evaluation. Unable to evaluate for focal hepatic lesion. Normal parenchymal echogenicity. Portal vein is patent on color Doppler imaging with normal direction of blood flow towards the liver. Other: None. IMPRESSION: 1. Abnormal gallbladder with markedly limited/nondiagnostic evaluation - question of a heterogeneous structure distending the gallbladder. Recommend CT abdomen pelvis with intravenous contrast for further evaluation. 2. Markedly limited evaluation of the liver due to decreased penetration of the sonographic waves. Electronically Signed   By: Iven Finn M.D.   On: 12/28/2020 22:45     Impression / Plan:   Impression: 1.  Acute cholecystitis with choledocholithiasis: Patient now status post PERC drain placement by IR this morning 12/29/2020, MRCP showing 4 mm calculus in the distal common bile duct 2.  Sepsis: From above 3.  Acute renal failure on CKD stage III 4.  COPD 5.  Paroxysmal A. fib: On warfarin, INR subtherapeutic at 1.3-currently on heparin drip 6.  Encephalopathy  Plan: 1.  Plan is for ERCP tomorrow with Dr. Fuller Plan at 3:30 PM.  Did call and speak with patient's sister Thong Raspa, who gave consent for IR procedure earlier today, given patient's altered mental status and she agrees to proceed.  She will need to be called  when consent is completed.  Her number is in the chart. 2.  Patient to remain n.p.o. for now given altered mental status. 3.  Agree with antibiotics 4.  Continue to monitor LFTs 5.  Appreciate interventional radiology's recommendations in regards to drain care 6.  Patient's heparin will need to be held 6 hours prior to time of procedure making this 9:30 AM tomorrow.  I have placed nursing care orders as well as altered the heparin order for pharmacy.  Thank you for your kind consultation, we  will continue to follow.  Lavone Nian Diany Formosa  12/29/2020, 2:20 PM

## 2020-12-29 NOTE — Progress Notes (Signed)
PHARMACY - PHYSICIAN COMMUNICATION CRITICAL VALUE ALERT - BLOOD CULTURE IDENTIFICATION (BCID)  Matthew Gutierrez is an 65 y.o. male who presented to [facility] on 12/28/2020 with a chief complaint of AMS/hypoxis  Assessment:  65 yo M with Klebsiella pneumonia bacteremia from IAI source, found to have acute cholecystitis. MRCP done this afternoon as well.   1 out of 4 blood cultures positive for GPC - identified as Staph species - likely contaminant  Current antibiotics: Ceftriaxone  Changes to prescribed antibiotics recommended:  Patient is on recommended antibiotics - No changes needed  Results for orders placed or performed during the hospital encounter of 12/28/20  Blood Culture ID Panel (Reflexed) (Collected: 12/28/2020 10:25 PM)  Result Value Ref Range   Enterococcus faecalis NOT DETECTED NOT DETECTED   Enterococcus Faecium NOT DETECTED NOT DETECTED   Listeria monocytogenes NOT DETECTED NOT DETECTED   Staphylococcus species DETECTED (A) NOT DETECTED   Staphylococcus aureus (BCID) NOT DETECTED NOT DETECTED   Staphylococcus epidermidis NOT DETECTED NOT DETECTED   Staphylococcus lugdunensis NOT DETECTED NOT DETECTED   Streptococcus species NOT DETECTED NOT DETECTED   Streptococcus agalactiae NOT DETECTED NOT DETECTED   Streptococcus pneumoniae NOT DETECTED NOT DETECTED   Streptococcus pyogenes NOT DETECTED NOT DETECTED   A.calcoaceticus-baumannii NOT DETECTED NOT DETECTED   Bacteroides fragilis NOT DETECTED NOT DETECTED   Enterobacterales NOT DETECTED NOT DETECTED   Enterobacter cloacae complex NOT DETECTED NOT DETECTED   Escherichia coli NOT DETECTED NOT DETECTED   Klebsiella aerogenes NOT DETECTED NOT DETECTED   Klebsiella oxytoca NOT DETECTED NOT DETECTED   Klebsiella pneumoniae NOT DETECTED NOT DETECTED   Proteus species NOT DETECTED NOT DETECTED   Salmonella species NOT DETECTED NOT DETECTED   Serratia marcescens NOT DETECTED NOT DETECTED   Haemophilus influenzae NOT DETECTED  NOT DETECTED   Neisseria meningitidis NOT DETECTED NOT DETECTED   Pseudomonas aeruginosa NOT DETECTED NOT DETECTED   Stenotrophomonas maltophilia NOT DETECTED NOT DETECTED   Candida albicans NOT DETECTED NOT DETECTED   Candida auris NOT DETECTED NOT DETECTED   Candida glabrata NOT DETECTED NOT DETECTED   Candida krusei NOT DETECTED NOT DETECTED   Candida parapsilosis NOT DETECTED NOT DETECTED   Candida tropicalis NOT DETECTED NOT DETECTED   Cryptococcus neoformans/gattii NOT DETECTED NOT DETECTED   Alanda Slim, PharmD, FCCM Clinical Pharmacist Please see AMION for all Pharmacists' Contact Phone Numbers 12/29/2020, 10:13 PM

## 2020-12-29 NOTE — Progress Notes (Signed)
Patient ID: Wolfgang Bovee, male   DOB: 1956-02-15, 65 y.o.   MRN: [id number]     Subjective: Not very responsive. Per ED RN this is how he has been. ROS negative except as listed above. Objective: Vital signs in last 24 hours: Temp:  [98.3 F (36.8 C)-101.4 F (38.6 C)] 98.3 F (36.8 C) (08/01 2345) Pulse Rate:  [84-110] 96 (08/02 0615) Resp:  [16-37] 22 (08/02 0615) BP: (118-163)/(67-96) 122/84 (08/02 0615) SpO2:  [86 %-100 %] 100 % (08/02 0615) Weight:  [130.6 kg] 130.6 kg (08/01 2232)    Intake/Output from previous day: 08/01 0701 - 08/02 0700 In: 4011.2 [IV Piggyback:4011.2] Out: -  Intake/Output this shift: No intake/output data recorded.  General appearance: no distress Resp: clear to auscultation bilaterally Cardio: irregularly irregular rhythm GI: some RUQ tenderness  Lab Results: CBC  Recent Labs    12/28/20 2141 12/29/20 0340  WBC 20.4* 16.7*  HGB 11.9* 11.3*  HCT 38.6* 35.5*  PLT 198 165   BMET Recent Labs    12/28/20 2141 12/29/20 0340  NA 131* 135  K 3.8 3.5  CL 89* 94*  CO2 27 30  GLUCOSE 248* 207*  BUN 32* 28*  CREATININE 1.92* 1.77*  CALCIUM 9.2 8.8*   PT/INR Recent Labs    12/28/20 2141 12/29/20 0340  LABPROT 15.8* 16.1*  INR 1.3* 1.3*   ABG No results for input(s): PHART, HCO3 in the last 72 hours.  Invalid input(s): PCO2, PO2  Studies/Results: CT HEAD WO CONTRAST (5MM)  Result Date: 12/28/2020 CLINICAL DATA:  Mental status change, unknown cause altered mental status. EXAM: CT HEAD WITHOUT CONTRAST TECHNIQUE: Contiguous axial images were obtained from the base of the skull through the vertex without intravenous contrast. COMPARISON:  None. FINDINGS: Brain: Normal anatomic configuration. There is moderate parenchymal volume loss, slightly advanced given the patient's age. Remote lacunar infarcts are noted within the left caudate nucleus, left insular cortex, and right thalamus. Periventricular white matter changes are present  likely reflecting the sequela of small vessel ischemia. No abnormal intra or extra-axial mass lesion or fluid collection. No abnormal mass effect or midline shift. No evidence of acute intracranial hemorrhage or infarct. Ventricular size is normal. Cerebellum unremarkable. Vascular: No asymmetric hyperdense vasculature at the skull base. Skull: Intact Sinuses/Orbits: Paranasal sinuses are clear. Orbits are unremarkable. Other: Mastoid air cells and middle ear cavities are clear. IMPRESSION: No acute intracranial abnormality. Advanced senescent change. Multiple remote infarcts as noted above. Electronically Signed   By: Fidela Salisbury MD   On: 12/28/2020 23:45   CT ABDOMEN PELVIS W CONTRAST  Result Date: 12/28/2020 CLINICAL DATA:  Abdominal pain, biliary obstruction suspected (Ped 0-18y) EXAM: CT ABDOMEN AND PELVIS WITH CONTRAST TECHNIQUE: Multidetector CT imaging of the abdomen and pelvis was performed using the standard protocol following bolus administration of intravenous contrast. CONTRAST:  124m OMNIPAQUE IOHEXOL 300 MG/ML  SOLN COMPARISON:  04/11/2020 FINDINGS: Lower chest: Small right pleural effusion noted at the visualized right lung base with right basilar atelectasis. Mild left basilar atelectasis. Extensive multi-vessel coronary artery calcification with probable stenting of the left anterior descending coronary artery. Cardiac size is mildly enlarged. No pericardial effusion. Hepatobiliary: The gallbladder is distended, the gallbladder wall is thickened, and there is extensive pericholecystic inflammatory change in keeping with changes of acute cholecystitis. The extrahepatic bile duct is not dilated. There is no intrahepatic biliary ductal dilation. The liver is unremarkable. Pancreas: Unremarkable Spleen: Unremarkable Adrenals/Urinary Tract: Adrenal glands are unremarkable. Kidneys are normal, without renal calculi,  focal lesion, or hydronephrosis. Bladder is unremarkable. Stomach/Bowel: The  stomach, small bowel, and large bowel are unremarkable. The appendix is not visualized and is likely absent. No free intraperitoneal gas or fluid. Vascular/Lymphatic: Mild aortoiliac atherosclerotic calcification. Advanced atherosclerotic calcification is seen within the visualized lower extremity arterial outflow. No aortic aneurysm. No pathologic adenopathy within the abdomen and pelvis. Reproductive: Prostate is unremarkable. Other: Tiny fat containing umbilical hernia.  Rectum unremarkable. Musculoskeletal: No acute bone abnormality. No lytic or blastic bone lesions are seen. IMPRESSION: Acute cholecystitis.  Surgical consultation is advised. Extensive coronary artery calcification.  Mild global cardiomegaly. Aortic Atherosclerosis (ICD10-I70.0). Electronically Signed   By: Fidela Salisbury MD   On: 12/28/2020 23:50   DG CHEST PORT 1 VIEW  Result Date: 12/29/2020 CLINICAL DATA:  Hypoxia EXAM: PORTABLE CHEST 1 VIEW COMPARISON:  Chest x-ray 12/28/2020, CT abdomen pelvis 12/28/2020 FINDINGS: Enlarged cardiac silhouette. The heart size and mediastinal contours are unchanged. Low lung volumes with bibasilar streaky airspace opacities likely representing atelectasis. Elevated right hemidiaphragm no focal consolidation. No pulmonary edema. Trace right pleural effusion. No left pleural effusion. No pneumothorax. No acute osseous abnormality. IMPRESSION: 1. Trace right pleural effusion. 2. Low lung volumes. 3. Elevated right hemidiaphragm. Electronically Signed   By: Iven Finn M.D.   On: 12/29/2020 03:59   DG Chest Portable 1 View  Result Date: 12/28/2020 CLINICAL DATA:  Tachycardia. EXAM: PORTABLE CHEST 1 VIEW COMPARISON:  04/11/2020 FINDINGS: Low lung volumes persist. Stable heart size, likely accentuated by technique and low lung volumes. Unchanged mediastinal contours. Streaky bibasilar atelectasis or scarring. No pulmonary edema, pleural effusion or pneumothorax. Patient's chin partially obscures the right  lung apex. No acute osseous abnormalities are seen. IMPRESSION: Low lung volumes with streaky bibasilar atelectasis or scarring. Electronically Signed   By: Keith Rake M.D.   On: 12/28/2020 22:58   US Abdomen Limited RUQ (LIVER/GB)  Result Date: 12/28/2020 CLINICAL DATA:  Abdominal pain. EXAM: ULTRASOUND ABDOMEN LIMITED RIGHT UPPER QUADRANT COMPARISON:  CT abdomen pelvis 04/11/2020 FINDINGS: Gallbladder: Abnormal gallbladder markedly limited evaluation - question of a heterogeneous structure distending the gallbladder. Common bile duct: Diameter: 4 mm Liver: Markedly limited evaluation. Unable to evaluate for focal hepatic lesion. Normal parenchymal echogenicity. Portal vein is patent on color Doppler imaging with normal direction of blood flow towards the liver. Other: None. IMPRESSION: 1. Abnormal gallbladder with markedly limited/nondiagnostic evaluation - question of a heterogeneous structure distending the gallbladder. Recommend CT abdomen pelvis with intravenous contrast for further evaluation. 2. Markedly limited evaluation of the liver due to decreased penetration of the sonographic waves. Electronically Signed   By: Iven Finn M.D.   On: 12/28/2020 22:45    Anti-infectives: Anti-infectives (From admission, onward)    Start     Dose/Rate Route Frequency Ordered Stop   12/29/20 2200  vancomycin (VANCOREADY) IVPB 1250 mg/250 mL        1,250 mg 166.7 mL/hr over 90 Minutes Intravenous Every 24 hours 12/29/20 0006     12/29/20 1000  ceFEPIme (MAXIPIME) 2 g in sodium chloride 0.9 % 100 mL IVPB        2 g 200 mL/hr over 30 Minutes Intravenous Every 12 hours 12/29/20 0200     12/29/20 0600  piperacillin-tazobactam (ZOSYN) IVPB 3.375 g  Status:  Discontinued       See Hyperspace for full Linked Orders Report.   3.375 g 12.5 mL/hr over 240 Minutes Intravenous Every 8 hours 12/28/20 2244 12/29/20 0200   12/29/20 0200  metroNIDAZOLE (FLAGYL)  IVPB 500 mg        500 mg 100 mL/hr over 60  Minutes Intravenous Every 8 hours 12/29/20 0154     12/29/20 0000  vancomycin (VANCOREADY) IVPB 1250 mg/250 mL  Status:  Discontinued        1,250 mg 166.7 mL/hr over 90 Minutes Intravenous Every 24 hours 12/28/20 2246 12/29/20 0006   12/28/20 2245  vancomycin (VANCOCIN) 2,500 mg in sodium chloride 0.9 % 500 mL IVPB        2,500 mg 250 mL/hr over 120 Minutes Intravenous  Once 12/28/20 2244 12/29/20 0205   12/28/20 2245  piperacillin-tazobactam (ZOSYN) IVPB 3.375 g       See Hyperspace for full Linked Orders Report.   3.375 g 100 mL/hr over 30 Minutes Intravenous  Once 12/28/20 2244 12/28/20 2319       Assessment/Plan: Cholecystitis - in light of multiple medical issues and his condition recommend perc chole tube by IR. Agree with ABX (on Vanc/cefepime). Continue bowel rest. Hyperbilirubinemia - MRCP to eval for CBD stone  We will follow   LOS: 0 days    Georganna Skeans, MD, MPH, FACS Trauma & General Surgery Use [email or web address] to contact on call provider  12/29/2020

## 2020-12-29 NOTE — ED Notes (Signed)
Calling out for help. Tech at bedside at this time

## 2020-12-29 NOTE — ED Notes (Signed)
Pt having audible congestion and attempting to cough things up but unable to do so. Admit provider paged at this time

## 2020-12-29 NOTE — ED Notes (Signed)
Patient transported to MRI 

## 2020-12-29 NOTE — ED Notes (Signed)
resp called at this time to come to room

## 2020-12-29 NOTE — Progress Notes (Signed)
RT assessed pt for ability to wear CPAP for the night. Pt is unable to pull mask off of his face without assistance. Placement is contraindicated at this time d/t possible aspiration r/t pt not being able to pull mask off if he were to vomit. RT will continue to monitor.

## 2020-12-29 NOTE — ED Notes (Signed)
Per pt he is trying to cough something up and he is unable to get it up. Resp called at this time to come and suction the pt

## 2020-12-29 NOTE — Progress Notes (Signed)
Pt unable tolerate MRI exam in it's entirety. Pt wanted to discontinue about 1/4 of the way into test. I was able to get him to continue long enough to get a full non-contrasted study.

## 2020-12-29 NOTE — ED Notes (Signed)
Attempted report x1. 

## 2020-12-29 NOTE — Procedures (Signed)
Vascular and Interventional Radiology Procedure Note  Patient: Jamse Seroka DOB: 12-04-1955 Medical Record Number: [id number] Note Date/Time: 12/29/20 11:56 AM   Performing Physician: Michaelle Birks, MD Assistant(s): Heide Guile, RT  Diagnosis: Acute Cholecystitis  Procedure:  CHOLECYSTOSTOMY TUBE PLACEMENT  Anesthesia: Conscious Sedation Complications: None Estimated Blood Loss: Minimal Specimens:  None  Findings:  Successful placement of 78F cholecystostomy tube.  Plan: Flush tube w 5 mL sterile NS and record drain output qShift. Follow up for routine tube evaluation in 6 week(s).   See detailed procedure note with images in PACS. The patient tolerated the procedure well without incident or complication and was returned to Floor Bed in stable condition.    Michaelle Birks, MD [facility] Interventional [facility] Radiology   Pager. [place]

## 2020-12-29 NOTE — Progress Notes (Signed)
Patient ID: Matthew Gutierrez, male   DOB: [birth year], 65 y.o.   MRN: [id number] Patient admitted early this morning for altered mental status and hypoxia and was found to have acute cholecystitis.  He was started on IV antibiotics.  General surgery was consulted and recommended MRCP.  Patient seen and examined at bedside.  I have reviewed patient's medical records including this morning's H&P, current vitals, labs, medications myself.  MRCP is pending.  Continue broad-spectrum antibiotics for now.

## 2020-12-30 ENCOUNTER — Inpatient Hospital Stay (HOSPITAL_COMMUNITY): Payer: No Typology Code available for payment source | Admitting: Certified Registered"

## 2020-12-30 ENCOUNTER — Inpatient Hospital Stay (HOSPITAL_COMMUNITY): Payer: No Typology Code available for payment source

## 2020-12-30 ENCOUNTER — Encounter (HOSPITAL_COMMUNITY)
Admission: EM | Disposition: A | Discharge status: Skilled Nursing Facility | Payer: Self-pay | Source: Skilled Nursing Facility | Attending: Internal Medicine

## 2020-12-30 ENCOUNTER — Other Ambulatory Visit: Payer: Self-pay

## 2020-12-30 DIAGNOSIS — R933 Abnormal findings on diagnostic imaging of other parts of digestive tract: Secondary | ICD-10-CM

## 2020-12-30 DIAGNOSIS — K8033 Calculus of bile duct with acute cholangitis with obstruction: Secondary | ICD-10-CM

## 2020-12-30 DIAGNOSIS — R7989 Other specified abnormal findings of blood chemistry: Secondary | ICD-10-CM

## 2020-12-30 HISTORY — PX: ERCP: SHX5425

## 2020-12-30 HISTORY — PX: SPHINCTEROTOMY: SHX5544

## 2020-12-30 HISTORY — PX: REMOVAL OF STONES: SHX5545

## 2020-12-30 LAB — CBC WITH DIFFERENTIAL/PLATELET
Abs Immature Granulocytes: 0.15 K/uL — ABNORMAL HIGH (ref 0.00–0.07)
Basophils Absolute: 0 K/uL (ref 0.0–0.1)
Basophils Relative: 0 %
Eosinophils Absolute: 0.1 K/uL (ref 0.0–0.5)
Eosinophils Relative: 1 %
HCT: 36.2 % — ABNORMAL LOW (ref 39.0–52.0)
Hemoglobin: 11.3 g/dL — ABNORMAL LOW (ref 13.0–17.0)
Immature Granulocytes: 2 %
Lymphocytes Relative: 2 %
Lymphs Abs: 0.2 K/uL — ABNORMAL LOW (ref 0.7–4.0)
MCH: 27.2 pg (ref 26.0–34.0)
MCHC: 31.2 g/dL (ref 30.0–36.0)
MCV: 87.2 fL (ref 80.0–100.0)
Monocytes Absolute: 1.4 K/uL — ABNORMAL HIGH (ref 0.1–1.0)
Monocytes Relative: 16 %
Neutro Abs: 7.4 K/uL (ref 1.7–7.7)
Neutrophils Relative %: 79 %
Platelets: 142 K/uL — ABNORMAL LOW (ref 150–400)
RBC: 4.15 MIL/uL — ABNORMAL LOW (ref 4.22–5.81)
RDW: 16.2 % — ABNORMAL HIGH (ref 11.5–15.5)
WBC: 9.3 K/uL (ref 4.0–10.5)
nRBC: 0 % (ref 0.0–0.2)

## 2020-12-30 LAB — HEPARIN LEVEL (UNFRACTIONATED): Heparin Unfractionated: <0.1 IU/mL — ABNORMAL LOW (ref 0.30–0.70)

## 2020-12-30 LAB — COMPREHENSIVE METABOLIC PANEL
ALT: 26 U/L (ref 0–44)
AST: 54 U/L — ABNORMAL HIGH (ref 15–41)
Albumin: 1.8 g/dL — ABNORMAL LOW (ref 3.5–5.0)
Alkaline Phosphatase: 200 U/L — ABNORMAL HIGH (ref 38–126)
Anion gap: 13 (ref 5–15)
BUN: 28 mg/dL — ABNORMAL HIGH (ref 8–23)
CO2: 29 mmol/L (ref 22–32)
Calcium: 9.1 mg/dL (ref 8.9–10.3)
Chloride: 96 mmol/L — ABNORMAL LOW (ref 98–111)
Creatinine, Ser: 1.71 mg/dL — ABNORMAL HIGH (ref 0.61–1.24)
GFR, Estimated: 44 mL/min — ABNORMAL LOW (ref >60)
Glucose, Bld: 181 mg/dL — ABNORMAL HIGH (ref 70–99)
Potassium: 3.4 mmol/L — ABNORMAL LOW (ref 3.5–5.1)
Sodium: 138 mmol/L (ref 135–145)
Total Bilirubin: 4.4 mg/dL — ABNORMAL HIGH (ref 0.3–1.2)
Total Protein: 6.7 g/dL (ref 6.5–8.1)

## 2020-12-30 LAB — MRSA NEXT GEN BY PCR, NASAL: MRSA by PCR Next Gen: NOT DETECTED

## 2020-12-30 LAB — PROTIME-INR
INR: 1.4 — ABNORMAL HIGH (ref 0.8–1.2)
Prothrombin Time: 17.1 seconds — ABNORMAL HIGH (ref 11.4–15.2)

## 2020-12-30 LAB — GLUCOSE, CAPILLARY
Glucose-Capillary: 173 mg/dL — ABNORMAL HIGH (ref 70–99)
Glucose-Capillary: 153 mg/dL — ABNORMAL HIGH (ref 70–99)
Glucose-Capillary: 159 mg/dL — ABNORMAL HIGH (ref 70–99)

## 2020-12-30 LAB — MAGNESIUM: Magnesium: 1.4 mg/dL — ABNORMAL LOW (ref 1.7–2.4)

## 2020-12-30 SURGERY — ERCP, WITH INTERVENTION IF INDICATED
Anesthesia: General

## 2020-12-30 MED ORDER — PROPOFOL 10 MG/ML IV BOLUS
INTRAVENOUS | Status: DC | PRN
  Administered 2020-12-30: 50 ug via INTRAVENOUS

## 2020-12-30 MED ORDER — MAGNESIUM SULFATE 2 GM/50ML IV SOLN
2.0000 g | Freq: Once | INTRAVENOUS | Status: AC
  Administered 2020-12-30: 2 g via INTRAVENOUS
  Filled 2020-12-30: qty 50

## 2020-12-30 MED ORDER — METOPROLOL TARTRATE 5 MG/5ML IV SOLN
5.0000 mg | INTRAVENOUS | Status: DC | PRN
  Administered 2020-12-31: 5 mg via INTRAVENOUS
  Filled 2020-12-30 (×2): qty 5

## 2020-12-30 MED ORDER — ONDANSETRON HCL 4 MG/2ML IJ SOLN
INTRAMUSCULAR | Status: DC | PRN
  Administered 2020-12-30: 4 mg via INTRAVENOUS

## 2020-12-30 MED ORDER — HYDRALAZINE HCL 20 MG/ML IJ SOLN: 10.0000 mg | INTRAMUSCULAR | Status: DC | PRN

## 2020-12-30 MED ORDER — PHENYLEPHRINE HCL-NACL 20-0.9 MG/250ML-% IV SOLN
INTRAVENOUS | Status: DC | PRN
  Administered 2020-12-30: 20 ug/min via INTRAVENOUS

## 2020-12-30 MED ORDER — FENTANYL CITRATE (PF) 100 MCG/2ML IJ SOLN
INTRAMUSCULAR | Status: DC | PRN
  Administered 2020-12-30: 50 ug via INTRAVENOUS

## 2020-12-30 MED ORDER — SODIUM CHLORIDE 0.9 % IV SOLN
INTRAVENOUS | Status: AC | PRN
  Administered 2020-12-30: 500 mL via INTRAVENOUS

## 2020-12-30 MED ORDER — POTASSIUM CHLORIDE 10 MEQ/100ML IV SOLN
10.0000 meq | INTRAVENOUS | Status: AC
  Administered 2020-12-30 (×4): 10 meq via INTRAVENOUS
  Filled 2020-12-30 (×4): qty 100

## 2020-12-30 MED ORDER — GLUCAGON HCL RDNA (DIAGNOSTIC) 1 MG IJ SOLR
INTRAMUSCULAR | Status: AC
  Filled 2020-12-30: qty 1

## 2020-12-30 MED ORDER — DEXMEDETOMIDINE (PRECEDEX) IN NS 20 MCG/5ML (4 MCG/ML) IV SYRINGE
PREFILLED_SYRINGE | INTRAVENOUS | Status: DC | PRN
  Administered 2020-12-30: 20 ug via INTRAVENOUS

## 2020-12-30 MED ORDER — TRAZODONE HCL 50 MG PO TABS
50.0000 mg | ORAL_TABLET | Freq: Every evening | ORAL | Status: DC | PRN
  Administered 2021-01-02 – 2021-01-06 (×3): 50 mg via ORAL
  Filled 2020-12-30 (×3): qty 1

## 2020-12-30 MED ORDER — CIPROFLOXACIN IN D5W 400 MG/200ML IV SOLN
INTRAVENOUS | Status: AC
  Filled 2020-12-30: qty 200

## 2020-12-30 MED ORDER — ROCURONIUM BROMIDE 10 MG/ML (PF) SYRINGE
PREFILLED_SYRINGE | INTRAVENOUS | Status: DC | PRN
  Administered 2020-12-30: 70 mg via INTRAVENOUS
  Administered 2020-12-30: 30 mg via INTRAVENOUS

## 2020-12-30 MED ORDER — COLLAGENASE 250 UNIT/GM EX OINT
TOPICAL_OINTMENT | Freq: Every day | CUTANEOUS | Status: DC
  Administered 2021-01-04 – 2021-01-07 (×3): 1 via TOPICAL
  Filled 2020-12-30 (×3): qty 30

## 2020-12-30 MED ORDER — SODIUM CHLORIDE 0.9 % IV SOLN
INTRAVENOUS | Status: DC | PRN
  Administered 2020-12-30: 40 mL

## 2020-12-30 MED ORDER — INDOMETHACIN 50 MG RE SUPP
RECTAL | Status: DC | PRN
  Administered 2020-12-30: 100 mg via RECTAL

## 2020-12-30 MED ORDER — INDOMETHACIN 50 MG RE SUPP
RECTAL | Status: AC
  Filled 2020-12-30: qty 1

## 2020-12-30 MED ORDER — PHENYLEPHRINE 40 MCG/ML (10ML) SYRINGE FOR IV PUSH (FOR BLOOD PRESSURE SUPPORT)
PREFILLED_SYRINGE | INTRAVENOUS | Status: DC | PRN
  Administered 2020-12-30: 160 ug via INTRAVENOUS

## 2020-12-30 MED ORDER — SODIUM CHLORIDE (PF) 0.9 % IJ SOLN
PREFILLED_SYRINGE | INTRAMUSCULAR | Status: DC | PRN
  Administered 2020-12-30: 3 mL

## 2020-12-30 MED ORDER — SUGAMMADEX SODIUM 200 MG/2ML IV SOLN
INTRAVENOUS | Status: DC | PRN
  Administered 2020-12-30: 250 mg via INTRAVENOUS
  Administered 2020-12-30: 150 mg via INTRAVENOUS

## 2020-12-30 MED ORDER — DEXAMETHASONE SODIUM PHOSPHATE 10 MG/ML IJ SOLN
INTRAMUSCULAR | Status: DC | PRN
  Administered 2020-12-30: 5 mg via INTRAVENOUS

## 2020-12-30 MED ORDER — GLUCAGON HCL RDNA (DIAGNOSTIC) 1 MG IJ SOLR
INTRAMUSCULAR | Status: DC | PRN
  Administered 2020-12-30 (×2): .25 mg via INTRAVENOUS

## 2020-12-30 MED ORDER — METRONIDAZOLE 500 MG/100ML IV SOLN
500.0000 mg | Freq: Two times a day (BID) | INTRAVENOUS | Status: DC
  Administered 2020-12-30 – 2020-12-31 (×2): 500 mg via INTRAVENOUS
  Filled 2020-12-30 (×3): qty 100

## 2020-12-30 NOTE — Progress Notes (Signed)
Referring Physician(s): Dr. Georganna Skeans  Supervising Physician: Ruthann Cancer  Patient Status:  [facility] - In-pt  Chief Complaint:  Follow up Perc Chole  Brief History:  Matthew Gutierrez is a 65 y.o. male who presented to the ER with altered mental status and fever.   His workup showed leukocytosis and elevated LFTs.   His imaging showed findings c/w acute cholecystitis.   Surgery team was consulted and given his overall status and underlying conditions, he was not felt to be a good surgical candidate.  He underwent percutaneous cholecystectomy by Dr. Maryelizabeth Kaufmann on 12/29/20.  Subjective:  Sleepy, doesn't speak much at all. Doesn't answer my questions.  Allergies: Codeine  Medications: Prior to Admission medications   Medication Sig Start Date End Date Taking? Authorizing Provider  albuterol (PROVENTIL) (2.5 MG/3ML) 0.083% nebulizer solution Inhale 3 mLs (2.5 mg total) into the lungs every 6 (six) hours as needed for wheezing or shortness of breath. 03/18/20  Yes Lillard Anes, MD  aspirin 325 MG tablet Take 325 mg by mouth daily.   Yes [provider]  atorvastatin (LIPITOR) 10 MG tablet Take 10 mg by mouth daily.   Yes [provider]  Cholecalciferol (VITAMIN D3) 1.25 MG (50000 UT) TABS Take 50,000 Units by mouth 2 (two) times a week. Every Wed and Sat   Yes [provider]  divalproex (DEPAKOTE) 125 MG DR tablet Take 125 mg by mouth 2 (two) times daily.   Yes [provider]  divalproex (DEPAKOTE) 500 MG DR tablet Take 500 mg by mouth 2 (two) times daily.   Yes [provider]  Dulaglutide (TRULICITY) [id number] [id number] SOPN Inject 0.75 mg into the skin every Tuesday.   Yes [provider]  DULoxetine (CYMBALTA) 60 MG capsule Take 60 mg by mouth daily.   Yes [provider]  EPINEPHrine 0.3 mg/0.3 mL IJ SOAJ injection Inject 0.3 mg into the muscle as needed for anaphylaxis. 03/18/20  Yes Lillard Anes, MD  fluticasone [facility]) 50 MCG/ACT nasal spray Place 1 spray into both nostrils daily. Patient taking differently: Place 1 spray into both nostrils daily as needed for allergies. 10/30/19  Yes Lillard Anes, MD  gabapentin (NEURONTIN) 300 MG capsule Take 1 capsule (300 mg total) by mouth at bedtime. 11/18/19  Yes Lillard Anes, MD  glucose blood test strip 1 each by Other route as needed. Use as instructed 03/17/20  Yes Lillard Anes, MD  isosorbide mononitrate (IMDUR) 120 MG 24 hr tablet Take 1 tablet (120 mg total) by mouth daily. 01/16/20  Yes Lillard Anes, MD  metoprolol succinate (TOPROL-XL) 25 MG 24 hr tablet Take 25 mg by mouth daily.   Yes [provider]  polyethylene glycol powder (GLYCOLAX/MIRALAX) 17 GM/SCOOP powder Take 1 Container by mouth every morning.   Yes [provider]  potassium chloride (KLOR-CON) 10 MEQ tablet Take 10 mEq by mouth daily.   Yes [provider]  risperiDONE (RISPERDAL) 1 MG tablet Take 1 mg by mouth 2 (two) times daily.   Yes [provider]  torsemide (DEMADEX) 20 MG tablet Take 20 mg by mouth daily.   Yes [provider]  allopurinol (ZYLOPRIM) 300 MG tablet Take 1 tablet (300 mg total) by mouth daily. Patient not taking: No sig reported 03/18/20   Lillard Anes, MD  carvedilol (COREG) 3.125 MG tablet Take 1 tablet (3.125 mg total) by mouth 2 (two) times daily with a meal. Patient not taking: No  sig reported 02/05/20   Lillard Anes, MD  diltiazem (CARDIZEM) 60 MG tablet Take 1 tablet (60 mg total) by mouth 2 (two) times daily. Patient not taking: No sig reported 02/05/20   Lillard Anes, MD  metFORMIN (GLUCOPHAGE) 1000 MG tablet Take 1,000 mg by mouth 2 (two) times daily with a meal. Patient not taking: No sig reported    [provider]  phenytoin (DILANTIN) 300 MG ER capsule Take 1 capsule (300 mg total) by mouth 2 (two) times daily.  Patient currently on 100 mg 2 times a day Patient not taking: No sig reported 01/16/20   Lillard Anes, MD  pravastatin (PRAVACHOL) 40 MG tablet Take 1 tablet (40 mg total) by mouth daily. Patient not taking: No sig reported 11/18/19   Lillard Anes, MD  TOVIAZ 4 MG TB24 tablet TAKE 1 TABLET EVERY DAY Patient not taking: No sig reported 03/25/20   Lillard Anes, MD  triamcinolone cream (KENALOG) 0.1 % APPLY TO THE AFFECTED AREA(S) TWICE DAILY AS NEEDED Patient not taking: No sig reported 11/16/19   Lillard Anes, MD  warfarin (COUMADIN) 2 MG tablet Take 1 tablet (2 mg total) by mouth 3 (three) times a week. Monday, Wednesday and Friday Patient not taking: No sig reported 03/18/20   Lillard Anes, MD  warfarin (COUMADIN) 4 MG tablet Take 4 mg by mouth daily. Saturday, Sunday,tuesday and thursday Patient not taking: No sig reported    [provider]     Vital Signs: BP (!) 152/80 (BP Location: Right Arm)   Pulse 91   Temp 100.1 F (37.8 C) (Axillary) Comment: Yellow MEWS protocol  Resp 14   Ht 6' (1.829 m)   Wt 131.5 kg   SpO2 98%   BMI 39.32 kg/m   Physical Exam Constitutional:      Appearance: He is ill-appearing.  HENT:     Head: Normocephalic and atraumatic.  Cardiovascular:     Rate and Rhythm: Normal rate.  Pulmonary:     Effort: Pulmonary effort is normal. No respiratory distress.  Abdominal:     Palpations: Abdomen is soft.     Comments: Chole drain in place. Flushes easily. 40 mL output recorded.  Skin:    General: Skin is warm and dry.  Neurological:     Mental Status: Mental status is at baseline.    Imaging: CT HEAD WO CONTRAST (5MM)  Result Date: 12/28/2020 CLINICAL DATA:  Mental status change, unknown cause altered mental status. EXAM: CT HEAD WITHOUT CONTRAST TECHNIQUE: Contiguous axial images were obtained from the base of the skull through the vertex without intravenous contrast. COMPARISON:  None.  FINDINGS: Brain: Normal anatomic configuration. There is moderate parenchymal volume loss, slightly advanced given the patient's age. Remote lacunar infarcts are noted within the left caudate nucleus, left insular cortex, and right thalamus. Periventricular white matter changes are present likely reflecting the sequela of small vessel ischemia. No abnormal intra or extra-axial mass lesion or fluid collection. No abnormal mass effect or midline shift. No evidence of acute intracranial hemorrhage or infarct. Ventricular size is normal. Cerebellum unremarkable. Vascular: No asymmetric hyperdense vasculature at the skull base. Skull: Intact Sinuses/Orbits: Paranasal sinuses are clear. Orbits are unremarkable. Other: Mastoid air cells and middle ear cavities are clear. IMPRESSION: No acute intracranial abnormality. Advanced senescent change. Multiple remote infarcts as noted above. Electronically Signed   By: Fidela Salisbury MD   On: 12/28/2020 23:45   CT ABDOMEN PELVIS W CONTRAST  Result Date: 12/28/2020 CLINICAL DATA:  Abdominal pain, biliary obstruction suspected (Ped 0-18y) EXAM: CT ABDOMEN AND PELVIS WITH CONTRAST TECHNIQUE: Multidetector CT imaging of the abdomen and pelvis was performed using the standard protocol following bolus administration of intravenous contrast. CONTRAST:  164m OMNIPAQUE IOHEXOL 300 MG/ML  SOLN COMPARISON:  04/11/2020 FINDINGS: Lower chest: Small right pleural effusion noted at the visualized right lung base with right basilar atelectasis. Mild left basilar atelectasis. Extensive multi-vessel coronary artery calcification with probable stenting of the left anterior descending coronary artery. Cardiac size is mildly enlarged. No pericardial effusion. Hepatobiliary: The gallbladder is distended, the gallbladder wall is thickened, and there is extensive pericholecystic inflammatory change in keeping with changes of acute cholecystitis. The extrahepatic bile duct is not dilated. There is no  intrahepatic biliary ductal dilation. The liver is unremarkable. Pancreas: Unremarkable Spleen: Unremarkable Adrenals/Urinary Tract: Adrenal glands are unremarkable. Kidneys are normal, without renal calculi, focal lesion, or hydronephrosis. Bladder is unremarkable. Stomach/Bowel: The stomach, small bowel, and large bowel are unremarkable. The appendix is not visualized and is likely absent. No free intraperitoneal gas or fluid. Vascular/Lymphatic: Mild aortoiliac atherosclerotic calcification. Advanced atherosclerotic calcification is seen within the visualized lower extremity arterial outflow. No aortic aneurysm. No pathologic adenopathy within the abdomen and pelvis. Reproductive: Prostate is unremarkable. Other: Tiny fat containing umbilical hernia.  Rectum unremarkable. Musculoskeletal: No acute bone abnormality. No lytic or blastic bone lesions are seen. IMPRESSION: Acute cholecystitis.  Surgical consultation is advised. Extensive coronary artery calcification.  Mild global cardiomegaly. Aortic Atherosclerosis (ICD10-I70.0). Electronically Signed   By: AFidela SalisburyMD   On: 12/28/2020 23:50   MR ABDOMEN MRCP WO CONTRAST  Result Date: 12/29/2020 CLINICAL DATA:  Biliary obstruction, choledocholithiasis, altered mental status, acute cholecystitis, status post percutaneous cholecystostomy EXAM: MRI ABDOMEN WITHOUT CONTRAST  (INCLUDING MRCP) TECHNIQUE: Multiplanar multisequence MR imaging of the abdomen was performed. Heavily T2-weighted images of the biliary and pancreatic ducts were obtained, and three-dimensional MRCP images were rendered by post processing. COMPARISON:  CT abdomen pelvis, 12/28/2020, percutaneous cholecystostomy, 12/29/2020 FINDINGS: Lower chest: Small bilateral pleural effusions and associated atelectasis or consolidation. Cardiomegaly. Hepatobiliary: Mild hepatic steatosis. No mass or other parenchymal abnormality identified. The gallbladder is distended, with wall thickening and  pericholecystic fluid, containing numerous tiny gallstones. Percutaneous cholecystostomy tube is in place, with formed pigtail in the gallbladder fundus. There is a small calculus in the distal common bile duct measuring 4 mm (series 8, image 27). No biliary ductal dilatation. Mild periportal edema. Pancreas: No mass, inflammatory changes, or other parenchymal abnormality identified. Spleen:  Splenomegaly, maximum coronal span 15.5 cm. Adrenals/Urinary Tract: No masses identified. No evidence of hydronephrosis. Stomach/Bowel: Visualized portions within the abdomen are unremarkable. Vascular/Lymphatic: No pathologically enlarged lymph nodes identified. No abdominal aortic aneurysm demonstrated. Other:  Small volume perihepatic ascites. Musculoskeletal: No suspicious bone lesions identified. IMPRESSION: 1. Percutaneous cholecystostomy tube is in place, with formed pigtail in the gallbladder fundus. 2. The gallbladder is distended, with wall thickening and pericholecystic fluid, containing numerous tiny gallstones. Findings are consistent with acute cholecystitis. 3. There is a 4 mm calculus in the distal common bile duct within the pancreatic head. No biliary ductal dilatation. 4. Mild hepatic steatosis. 5. Splenomegaly. 6. Small volume perihepatic ascites. 7. Small bilateral pleural effusions and associated atelectasis or consolidation. 8. Cardiomegaly. Electronically Signed   By: AEddie CandleM.D.   On: 12/29/2020 13:40   IR Perc Cholecystostomy  Result Date: 12/29/2020 INDICATION: 65year old male with acute cholecystitis EXAM: ULTRASOUND AND FLUOROSCOPIC-GUIDED CHOLECYSTOSTOMY TUBE  PLACEMENT COMPARISON:  CT Abdomen Pelvis, 12/28/2020. MEDICATIONS: The patient is currently admitted to the hospital and on intravenous antibiotics. Antibiotics were administered within an appropriate time frame prior to skin puncture. ANESTHESIA/SEDATION: Moderate (conscious) sedation was employed during this procedure. A total of  Versed 1.5 mg and Fentanyl 75 mcg was administered intravenously. Moderate Sedation Time: 26 minutes. The patient's level of consciousness and vital signs were monitored continuously by radiology nursing throughout the procedure under my direct supervision. CONTRAST:  28m OMNIPAQUE IOHEXOL 300 MG/ML SOLN - administered into the gallbladder fossa. FLUOROSCOPY TIME:  1 minutes 0 seconds (9 mGy) COMPLICATIONS: None immediate. PROCEDURE: Informed written consent was obtained from the patient and/or patient's representative after a discussion of the risks, benefits and alternatives to treatment. Questions regarding the procedure were encouraged and answered. A timeout was performed prior to the initiation of the procedure. The right upper abdominal quadrant was prepped and draped in the usual sterile fashion, and a sterile drape was applied covering the operative field. Maximum barrier sterile technique with sterile gowns and gloves were used for the procedure. A timeout was performed prior to the initiation of the procedure. Local anesthesia was provided with 1% lidocaine with epinephrine. Ultrasound scanning of the right upper quadrant demonstrates a markedly dilated gallbladder. Utilizing a transhepatic approach, a 22 gauge needle was advanced into the gallbladder under direct ultrasound guidance. An ultrasound image was saved for documentation purposes. Appropriate intraluminal puncture was confirmed with the efflux of bile and advancement of an 0.018 wire into the gallbladder lumen. The needle was exchanged for an [place]. A small amount of contrast was injected to confirm appropriate intraluminal positioning. Over a [name] wire, a 1162-French Cook cholecystomy tube was advanced into the gallbladder fossa, coiled and locked. Bile was aspirated and a small amount of contrast was injected as several post procedural spot radiographic images were obtained in various obliquities. The catheter was secured to the  skin with suture, connected to a drainage bag and a dressing was placed. The patient tolerated the procedure well without immediate post procedural complication. IMPRESSION: Successful placement of a 10.2 French cholecystostomy tube, as above. JMichaelle Birks MD [facility] Electronically Signed   By: JMichaelle BirksMD   On: 12/29/2020 16:50   DG CHEST PORT 1 VIEW  Result Date: 12/29/2020 CLINICAL DATA:  Hypoxia EXAM: PORTABLE CHEST 1 VIEW COMPARISON:  Chest x-ray 12/28/2020, CT abdomen pelvis 12/28/2020 FINDINGS: Enlarged cardiac silhouette. The heart size and mediastinal contours are unchanged. Low lung volumes with bibasilar streaky airspace opacities likely representing atelectasis. Elevated right hemidiaphragm no focal consolidation. No pulmonary edema. Trace right pleural effusion. No left pleural effusion. No pneumothorax. No acute osseous abnormality. IMPRESSION: 1. Trace right pleural effusion. 2. Low lung volumes. 3. Elevated right hemidiaphragm. Electronically Signed   By: MIven FinnM.D.   On: 12/29/2020 03:59   DG Chest Portable 1 View  Result Date: 12/28/2020 CLINICAL DATA:  Tachycardia. EXAM: PORTABLE CHEST 1 VIEW COMPARISON:  04/11/2020 FINDINGS: Low lung volumes persist. Stable heart size, likely accentuated by technique and low lung volumes. Unchanged mediastinal contours. Streaky bibasilar atelectasis or scarring. No pulmonary edema, pleural effusion or pneumothorax. Patient's chin partially obscures the right lung apex. No acute osseous abnormalities are seen. IMPRESSION: Low lung volumes with streaky bibasilar atelectasis or scarring. Electronically Signed   By: MKeith RakeM.D.   On: 12/28/2020 22:58   UK[place]Abdomen Limited RUQ (LIVER/GB)  Result Date: 12/28/2020 CLINICAL DATA:  Abdominal pain. EXAM: ULTRASOUND ABDOMEN LIMITED RIGHT UPPER QUADRANT COMPARISON:  CT abdomen pelvis 04/11/2020 FINDINGS: Gallbladder: Abnormal  gallbladder markedly limited evaluation - question of a heterogeneous structure distending the gallbladder. Common bile duct: Diameter: 4 mm Liver: Markedly limited evaluation. Unable to evaluate for focal hepatic lesion. Normal parenchymal echogenicity. Portal vein is patent on color Doppler imaging with normal direction of blood flow towards the liver. Other: None. IMPRESSION: 1. Abnormal gallbladder with markedly limited/nondiagnostic evaluation - question of a heterogeneous structure distending the gallbladder. Recommend CT abdomen pelvis with intravenous contrast for further evaluation. 2. Markedly limited evaluation of the liver due to decreased penetration of the sonographic waves. Electronically Signed   By: Iven Finn M.D.   On: 12/28/2020 22:45    Labs:  CBC: Recent Labs    03/26/20 1137 12/28/20 2141 12/29/20 0340 12/30/20 0032  WBC 11.7* 20.4* 16.7* 9.3  HGB 9.2* 11.9* 11.3* 11.3*  HCT 28.6* 38.6* 35.5* 36.2*  PLT 242 198 165 142*    COAGS: Recent Labs    03/26/20 1137 12/28/20 2141 12/29/20 0340 12/30/20 0032  INR 3.2* 1.3* 1.3* 1.4*    BMP: Recent Labs    01/16/20 1529 01/31/20 1129 03/12/20 1202 03/26/20 1137 12/28/20 2141 12/29/20 0340 12/30/20 0032  NA 138 136 136 137 131* 135 138  K 3.7 3.9 5.0 5.0 3.8 3.5 3.4*  CL 90* 90* 93* 94* 89* 94* 96*  CO2 30* '27 27 23 27 30 29  '$ GLUCOSE 143* 167* 130* 184* 248* 207* 181*  BUN 42* 34* 31* 59* 32* 28* 28*  CALCIUM 7.0* 7.7* 8.8 9.3 9.2 8.8* 9.1  CREATININE 2.20* 1.74* 1.70* 1.57* 1.92* 1.77* 1.71*  GFRNONAA 30* 40* 41* 46* 38* 42* 44*  GFRAA 35* 47* 48* 53*  --   --   --     LIVER FUNCTION TESTS: Recent Labs    03/26/20 1137 12/28/20 2141 12/29/20 0340 12/30/20 0032  BILITOT 0.4 5.2* 4.6* 4.4*  AST 16 54* 48* 54*  ALT '30 24 23 26  '$ ALKPHOS 154* 267* 244* 200*  PROT 8.1 7.6 6.7 6.7  ALBUMIN 4.3 2.0* 1.7* 1.8*    Assessment and Plan:  Acute cholecystitis = s/p d rain by Dr. Earleen Newport  12/28/20  Continue routine drain care.  Perc chole drain to remain in place at least 6 weeks.  If General Surgery deems patient will be a surgical candidate once medically stable, recommend drain injection to evaluate patency of the cystic duct prior to surgery.  If General Surgery feels patient is never a candidate for cholecystectomy, recommend drain injection to evaluate patency of the cystic duct. If patent, will cap tube for trial. If symptoms re-occur, put back to gravity bag.  If patient tolerates capping trial, can discuss removal of the tube. If patient is NOT a candidate for removal of the tube, he will need routine exchange of the perc chole every 10-12 weeks.  Electronically Signed: Murrell Redden, PA-C 12/30/2020, 2:18 PM    I spent a total of 15 Minutes at the the patient's bedside AND on the patient's hospital floor or unit, greater than 50% of which was counseling/coordinating care for f/u perc chole.

## 2020-12-30 NOTE — Anesthesia Procedure Notes (Signed)
Procedure Name: Intubation Date/Time: 12/30/2020 3:56 PM Performed by: Barrington Ellison, CRNA Pre-anesthesia Checklist: Patient identified, Emergency Drugs available, Suction available and Patient being monitored Patient Re-evaluated:Patient Re-evaluated prior to induction Oxygen Delivery Method: Circle System Utilized Preoxygenation: Pre-oxygenation with 100% oxygen Induction Type: IV induction Ventilation: Mask ventilation without difficulty Tube type: Oral Tube size: 8.0 mm Number of attempts: 2 Airway Equipment and Method: Stylet and Oral airway Placement Confirmation: ETT inserted through vocal cords under direct vision, positive ETCO2 and breath sounds checked- equal and bilateral Secured at: 24 cm Tube secured with: Tape Dental Injury: Teeth and Oropharynx as per pre-operative assessment  Comments: DL 1st attempt with Mac 4, Grade 1 view, used 7.5 ETT- air leak noted, reintubated with 8.0 ETT, no leak- recommend using 8.0 ETT

## 2020-12-30 NOTE — Progress Notes (Signed)
[place] for Heparin (warfarin on hold) Indication: Afib, history of DVT  Allergies  Allergen Reactions   Codeine Other (See Comments)    Unknown Reaction    Patient Measurements: Height: 6' (182.9 cm) Weight: 130.6 kg (287 lb 14.7 oz) IBW/kg (Calculated) : 77.6  Vital Signs: Temp: 97.7 F (36.5 C) (08/02 2320) Temp Source: Oral (08/02 2320) BP: 164/84 (08/02 2320) Pulse Rate: 90 (08/02 2320)  Labs: Recent Labs    12/28/20 2141 12/29/20 0340 12/30/20 0032  HGB 11.9* 11.3* 11.3*  HCT 38.6* 35.5* 36.2*  PLT 198 165 142*  LABPROT 15.8* 16.1* 17.1*  INR 1.3* 1.3* 1.4*  HEPARINUNFRC  --   --  <0.10*  CREATININE 1.92* 1.77* 1.71*     Estimated Creatinine Clearance: 59.4 mL/min (A) (by C-G formula based on SCr of 1.71 mg/dL (H)).   Medical History: Past Medical History:  Diagnosis Date   Absence epileptic syndrome, not intractable, without status epilepticus ([place]) 07/23/2019   Acute renal failure superimposed on stage 3 chronic kidney disease ([place]) 07/23/2019   Atherosclerotic heart disease 07/23/2019   Atrial fibrillation with RVR ([place]) 12/14/2019   Benign enlargement of prostate 07/23/2019   Body mass index 50.0-59.9, adult ([place]) 07/23/2019   Cellulitis of leg 12/14/2019   Chronic respiratory failure with hypoxia ([place]) 07/23/2019   Chronic venous hypertension (idiopathic) with ulcer of left lower extremity ([place]) 07/23/2019   COPD (chronic obstructive pulmonary disease) ([place]) 07/23/2019   Diabetic glomerulopathy ([place]) 10/15/2019   Diabetic neuropathy ([place]) 07/23/2019   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[place]) 10/15/2019   Epilepsy, grand mal ([place])    Essential hypertension 01/22/2018   Frozen shoulder    Right Shoulder   Gout attack 10/06/2019   H/O acute myocardial infarction 01/22/2018   H/O: stroke 01/22/2018   History of DVT (deep vein thrombosis) 01/22/2018   Incontinence without sensory awareness 07/23/2019   Long term (current) use of  anticoagulants 07/23/2019   Lumbago    Lymphedema of both lower extremities 01/22/2018   Mixed hyperlipidemia 07/23/2019   Morbid obesity ([place]) 07/23/2019   Obstructive sleep apnea 07/23/2019   Paroxysmal atrial fibrillation ([place]) 01/07/2020   Peripheral vascular disease ([place])    Peripheral vascular disease, unspecified ([place]) 07/12/2011   Secondary hypercoagulable state ([place]) 01/07/2020   Shoulder bursitis 07/25/2019   Stroke [facility])    Urinary incontinence 07/25/2019    Assessment: 65 y/o M from SNF with hypoxemia. Unable to follow commands in the ED. Having some abdominal pain. On warfarin PTA for history of DVT and afib- of note, Nursing MAR reported as not taking (unclear why). INR is low at 1.3. Hgb 11.9. Noted renal dysfunction.   Underwent cholecystostomy tube placement 8/2 - okay with IR to restart heparin 6 hr after procedure. No s/sx of bleeding.   8/3 AM update:  Heparin level sub-therapeutic  INR 1.4  Goal of Therapy:  Heparin level 0.3-0.7 units/mL Monitor platelets by anticoagulation protocol: Yes   Plan:  Inc heparin to 1600 units/hr Heparin off at 0930 this AM for tentative ERCP F/U heparin re-start s/p procedure   Narda Bonds, PharmD, BCPS Clinical Pharmacist Phone: [phone]

## 2020-12-30 NOTE — Progress Notes (Signed)
PROGRESS NOTE    Matthew Gutierrez  [id number] DOB: 1956/01/24 DOA: 12/28/2020 PCP: Lillard Anes, MD   Brief Narrative:  65 year old with history of DM2, CKD stage III, A. fib, COPD, peripheral neuropathy secondary to DM2, stroke, OSA, PVD admitted to the hospital from SNF due to altered mental status and hypoxia.  She was found to be febrile and septic in the ED.  Work-up showed acute cholecystitis, general surgery was consulted who recommended MRCP which was positive.  Started on IV antibiotics.   Assessment & Plan:   Principal Problem:   Acute cholecystitis Active Problems:   Essential hypertension   H/O: stroke   Acute renal failure superimposed on stage 3 chronic kidney disease (HCC)   COPD (chronic obstructive pulmonary disease) ([place])   Long term (current) use of anticoagulants   Obstructive sleep apnea   Paroxysmal atrial fibrillation (HCC)   Sepsis (HCC)   Seizure disorder (HCC)   Encephalopathy   Severe sepsis secondary to acute cholecystitis Gram-negative rod bacteremia Gram-positive cocci in anaerobic bottle -Sepsis physiology is improving.  Fever, leukocytosis, lactic acidosis - IV antibiotics- Rocephin & Flagyl. No need to add vanc for now, monitor clinically.  - Seen by general surgery, high risk for surgery therefore cholecystostomy tube placed by IR 8/2 - Follow-up culture data - Seen by GI-tentative plans for ERCP  Urinary tract infection - Urine cultures not sent on admission.  Already on IV Rocephin    Essential hypertension -Continue home medications-currently on hold.  As needed IV hydralazine/Lopressor ordered   stage 3b chronic kidney disease Baseline Cr 1.8. patient doesn't have AKI.  Check electrolytes and renal function in morning with labs     COPD (chronic obstructive pulmonary disease) Albuterol nebulizer as needed for shortness of breath.     Paroxysmal atrial fibrillation -Rate controlled.  On Coumadin at home.  Currently on  heparin drip     Encephalopathy,Improved. Suspect vascular Infarcts.  -Likely from underlying infection. CT head shows advances senescent changes. Multiple remote infarcts.      Obstructive sleep apnea -CPAP nightly     Seizure disorder -Continue gabapentin and Depakote     H/O: stroke     Long term (current) use of anticoagulants      DVT prophylaxis: Hep Drip Code Status: Full Family Communication:  Called Sister [place].   Status is: Inpatient  Remains inpatient appropriate because:Inpatient level of care appropriate due to severity of illness  Dispo: The patient is from: Home              Anticipated d/c is to: Home              Patient currently is not medically stable to d/c.   Difficult to place patient No     Nutritional status           Body mass index is 39.32 kg/m.  Pressure Injury 12/29/20 Heel Left;Lateral Unstageable - Full thickness tissue loss in which the base of the injury is covered by slough (yellow, tan, gray, green or brown) and/or eschar (tan, brown or black) in the wound bed. (Active)  12/29/20 2300  Location: Heel  Location Orientation: Left;Lateral  Staging: Unstageable - Full thickness tissue loss in which the base of the injury is covered by slough (yellow, tan, gray, green or brown) and/or eschar (tan, brown or black) in the wound bed.  Wound Description (Comments):   Present on Admission: Yes     Pressure Injury 12/29/20 Ankle Left;Lateral Unstageable - Full  thickness tissue loss in which the base of the injury is covered by slough (yellow, tan, gray, green or brown) and/or eschar (tan, brown or black) in the wound bed. (Active)  12/29/20 2300  Location: Ankle  Location Orientation: Left;Lateral  Staging: Unstageable - Full thickness tissue loss in which the base of the injury is covered by slough (yellow, tan, gray, green or brown) and/or eschar (tan, brown or black) in the wound bed.  Wound Description (Comments):   Present on  Admission: Yes     Pressure Injury 12/29/20 Ankle Left;Medial;Lateral Unstageable - Full thickness tissue loss in which the base of the injury is covered by slough (yellow, tan, gray, green or brown) and/or eschar (tan, brown or black) in the wound bed. (Active)  12/29/20 2300  Location: Ankle  Location Orientation: Left;Medial;Lateral  Staging: Unstageable - Full thickness tissue loss in which the base of the injury is covered by slough (yellow, tan, gray, green or brown) and/or eschar (tan, brown or black) in the wound bed.  Wound Description (Comments):   Present on Admission: Yes     Pressure Injury 12/29/20 Ankle Right;Lateral Unstageable - Full thickness tissue loss in which the base of the injury is covered by slough (yellow, tan, gray, green or brown) and/or eschar (tan, brown or black) in the wound bed. (Active)  12/29/20 2300  Location: Ankle  Location Orientation: Right;Lateral  Staging: Unstageable - Full thickness tissue loss in which the base of the injury is covered by slough (yellow, tan, gray, green or brown) and/or eschar (tan, brown or black) in the wound bed.  Wound Description (Comments):   Present on Admission: Yes     Pressure Injury 12/29/20 Heel Right;Lateral Unstageable - Full thickness tissue loss in which the base of the injury is covered by slough (yellow, tan, gray, green or brown) and/or eschar (tan, brown or black) in the wound bed. (Active)  12/29/20 2300  Location: Heel  Location Orientation: Right;Lateral  Staging: Unstageable - Full thickness tissue loss in which the base of the injury is covered by slough (yellow, tan, gray, green or brown) and/or eschar (tan, brown or black) in the wound bed.  Wound Description (Comments):   Present on Admission: Yes     Pressure Injury Foot Right;Lateral Unstageable - Full thickness tissue loss in which the base of the injury is covered by slough (yellow, tan, gray, green or brown) and/or eschar (tan, brown or black)  in the wound bed. (Active)     Location: Foot  Location Orientation: Right;Lateral  Staging: Unstageable - Full thickness tissue loss in which the base of the injury is covered by slough (yellow, tan, gray, green or brown) and/or eschar (tan, brown or black) in the wound bed.  Wound Description (Comments):   Present on Admission: Yes    Subjective: Sister tells me his health has declined over the last few months especially in the last 2 months.   Review of Systems Otherwise negative except as per HPI, including: General: Denies fever, chills, night sweats or unintended weight loss. Resp: Denies cough, wheezing, shortness of breath. Cardiac: Denies chest pain, palpitations, orthopnea, paroxysmal nocturnal dyspnea. GI: Denies abdominal pain, nausea, vomiting, diarrhea or constipation GU: Denies dysuria, frequency, hesitancy or incontinence MS: Denies muscle aches, joint pain or swelling Neuro: Denies headache, neurologic deficits (focal weakness, numbness, tingling), abnormal gait Psych: Denies anxiety, depression, SI/HI/AVH Skin: Denies new rashes or lesions ID: Denies sick contacts, exotic exposures, travel  Examination:  General exam: Appears calm and  comfortable. Sluggish slow to repond.  Respiratory system: Clear to auscultation. Respiratory effort normal. Cardiovascular system: S1 & S2 heard, RRR. No JVD, murmurs, rubs, gallops or clicks. No pedal edema. Gastrointestinal system: Abdomen is nondistended, soft and nontender. No organomegaly or masses felt. Normal bowel sounds heard. Central nervous system: Alert and oriented x 1-2 . No focal neurological deficits. Extremities: Symmetric 5 x 5 power. Skin: No rashes, lesions or ulcers Psychiatry: Poor judgement and insigt    Objective: Vitals:   12/30/20 0355 12/30/20 0640 12/30/20 0648 12/30/20 0748  BP:  (!) 166/92 (!) 166/92 (!) 166/76  Pulse:  93 93 98  Resp:  '20 20 16  '$ Temp:  (!) 101.5 F (38.6 C) (!) 101.5 F (38.6  C) (!) 97.5 F (36.4 C)  TempSrc:  Rectal  Oral  SpO2:  96%  94%  Weight: 131.5 kg     Height:        Intake/Output Summary (Last 24 hours) at 12/30/2020 0835 Last data filed at 12/30/2020 [id number] Gross per 24 hour  Intake 821.26 ml  Output 740 ml  Net 81.26 ml   Filed Weights   12/28/20 2232 12/30/20 0355  Weight: 130.6 kg 131.5 kg     Data Reviewed:   CBC: Recent Labs  Lab 12/28/20 2141 12/29/20 0340 12/30/20 0032  WBC 20.4* 16.7* 9.3  NEUTROABS 17.7*  --  7.4  HGB 11.9* 11.3* 11.3*  HCT 38.6* 35.5* 36.2*  MCV 86.2 86.0 87.2  PLT 198 165 [id number]*   Basic Metabolic Panel: Recent Labs  Lab 12/28/20 2141 12/29/20 0340 12/30/20 0032  NA 131* 135 138  K 3.8 3.5 3.4*  CL 89* 94* 96*  CO2 '27 30 29  '$ GLUCOSE 248* 207* 181*  BUN 32* 28* 28*  CREATININE 1.92* 1.77* 1.71*  CALCIUM 9.2 8.8* 9.1  MG  --   --  1.4*   GFR: Estimated Creatinine Clearance: 59.6 mL/min (A) (by C-G formula based on SCr of 1.71 mg/dL (H)). Liver Function Tests: Recent Labs  Lab 12/28/20 2141 12/29/20 0340 12/30/20 0032  AST 54* 48* 54*  ALT '24 23 26  '$ ALKPHOS 267* 244* 200*  BILITOT 5.2* 4.6* 4.4*  PROT 7.6 6.7 6.7  ALBUMIN 2.0* 1.7* 1.8*   No results for input(s): LIPASE, AMYLASE in the last 168 hours. Recent Labs  Lab 12/28/20 2200  AMMONIA 16   Coagulation Profile: Recent Labs  Lab 12/28/20 2141 12/29/20 0340 12/30/20 0032  INR 1.3* 1.3* 1.4*   Cardiac Enzymes: No results for input(s): CKTOTAL, CKMB, CKMBINDEX, TROPONINI in the last 168 hours. BNP (last 3 results) No results for input(s): PROBNP in the last 8760 hours. HbA1C: Recent Labs    12/29/20 0340  HGBA1C 8.6*   CBG: Recent Labs  Lab 12/29/20 1407 12/29/20 1838 12/29/20 2341 12/30/20 0631  GLUCAP 191* 171* 157* 173*   Lipid Profile: No results for input(s): CHOL, HDL, LDLCALC, TRIG, CHOLHDL, LDLDIRECT in the last 72 hours. Thyroid Function Tests: Recent Labs    12/28/20 2200  TSH 1.844   Anemia  Panel: No results for input(s): VITAMINB12, FOLATE, FERRITIN, TIBC, IRON, RETICCTPCT in the last 72 hours. Sepsis Labs: Recent Labs  Lab 12/28/20 2145 12/28/20 2230 12/29/20 0340  LATICACIDVEN 2.2* 2.5* 1.4    Recent Results (from the past 240 hour(s))  Resp Panel by RT-PCR (Flu A&B, Covid) Nasopharyngeal Swab     Status: None   Collection Time: 12/28/20  9:42 PM   Specimen: Nasopharyngeal Swab; Nasopharyngeal(NP) swabs in vial  transport medium  Result Value Ref Range Status   SARS Coronavirus 2 by RT PCR NEGATIVE NEGATIVE Final    Comment: (NOTE) SARS-CoV-2 target nucleic acids are NOT DETECTED.  The SARS-CoV-2 RNA is generally detectable in upper respiratory specimens during the acute phase of infection. The lowest concentration of SARS-CoV-2 viral copies this assay can detect is 138 copies/mL. A negative result does not preclude SARS-Cov-2 infection and should not be used as the sole basis for treatment or other patient management decisions. A negative result may occur with  improper specimen collection/handling, submission of specimen other than nasopharyngeal swab, presence of viral mutation(s) within the areas targeted by this assay, and inadequate number of viral copies(<138 copies/mL). A negative result must be combined with clinical observations, patient history, and epidemiological information. The expected result is Negative.  Fact Sheet for Patients:  EntrepreneurPulse.com.au  Fact Sheet for Healthcare Providers:  IncredibleEmployment.be  This test is no t yet approved or cleared by the [place] FDA and  has been authorized for detection and/or diagnosis of SARS-CoV-2 by FDA under an Emergency Use Authorization (EUA). This EUA will remain  in effect (meaning this test can be used) for the duration of the COVID-19 declaration under Section 564(b)(1) of the Act, 21 U.S.C.section 360bbb-3(b)(1), unless the authorization is  terminated  or revoked sooner.       Influenza A by PCR NEGATIVE NEGATIVE Final   Influenza B by PCR NEGATIVE NEGATIVE Final    Comment: (NOTE) The Xpert Xpress SARS-CoV-2/FLU/RSV plus assay is intended as an aid in the diagnosis of influenza from Nasopharyngeal swab specimens and should not be used as a sole basis for treatment. Nasal washings and aspirates are unacceptable for Xpert Xpress SARS-CoV-2/FLU/RSV testing.  Fact Sheet for Patients: EntrepreneurPulse.com.au  Fact Sheet for Healthcare Providers: IncredibleEmployment.be  This test is not yet approved or cleared by the [place] FDA and has been authorized for detection and/or diagnosis of SARS-CoV-2 by FDA under an Emergency Use Authorization (EUA). This EUA will remain in effect (meaning this test can be used) for the duration of the COVID-19 declaration under Section 564(b)(1) of the Act, 21 U.S.C. section 360bbb-3(b)(1), unless the authorization is terminated or revoked.  Performed at [facility], [place] [address] [place], [place] [address]   Culture, blood (Routine x 2)     Status: None (Preliminary result)   Collection Time: 12/28/20  9:45 PM   Specimen: BLOOD  Result Value Ref Range Status   Specimen Description BLOOD RIGHT ANTECUBITAL  Final   Special Requests   Final    BOTTLES DRAWN AEROBIC AND ANAEROBIC Blood Culture adequate volume   Culture  Setup Time   Final    GRAM NEGATIVE RODS AEROBIC BOTTLE ONLY CRITICAL RESULT CALLED TO, READ BACK BY AND VERIFIED WITH: RN C.COBB ON [id number] AT [id number] BY [place] Performed at [facility], [place] [address] [place], [place] [address]    Culture GRAM NEGATIVE RODS  Final   Report Status PENDING  Incomplete  Blood Culture ID Panel (Reflexed)     Status: Abnormal   Collection Time: 12/28/20  9:45 PM  Result Value Ref Range Status   Enterococcus faecalis NOT DETECTED NOT DETECTED Final   Enterococcus Faecium NOT  DETECTED NOT DETECTED Final   Listeria monocytogenes NOT DETECTED NOT DETECTED Final   Staphylococcus species NOT DETECTED NOT DETECTED Final   Staphylococcus aureus (BCID) NOT DETECTED NOT DETECTED Final   Staphylococcus epidermidis NOT DETECTED NOT DETECTED Final  Staphylococcus lugdunensis NOT DETECTED NOT DETECTED Final   Streptococcus species NOT DETECTED NOT DETECTED Final   Streptococcus agalactiae NOT DETECTED NOT DETECTED Final   Streptococcus pneumoniae NOT DETECTED NOT DETECTED Final   Streptococcus pyogenes NOT DETECTED NOT DETECTED Final   A.calcoaceticus-baumannii NOT DETECTED NOT DETECTED Final   Bacteroides fragilis NOT DETECTED NOT DETECTED Final   Enterobacterales DETECTED (A) NOT DETECTED Final    Comment: Enterobacterales represent a large order of gram negative bacteria, not a single organism. CRITICAL RESULT CALLED TO, READ BACK BY AND VERIFIED WITH: RN C.COBB ON [id number] AT 1322 BY E.PARRISH    Enterobacter cloacae complex NOT DETECTED NOT DETECTED Final   Escherichia coli NOT DETECTED NOT DETECTED Final   Klebsiella aerogenes NOT DETECTED NOT DETECTED Final   Klebsiella oxytoca NOT DETECTED NOT DETECTED Final   Klebsiella pneumoniae DETECTED (A) NOT DETECTED Final    Comment: CRITICAL RESULT CALLED TO, READ BACK BY AND VERIFIED WITH: RN C.COBB ON [id number] AT 1322 BY E.PARRISH    Proteus species NOT DETECTED NOT DETECTED Final   Salmonella species NOT DETECTED NOT DETECTED Final   Serratia marcescens NOT DETECTED NOT DETECTED Final   Haemophilus influenzae NOT DETECTED NOT DETECTED Final   Neisseria meningitidis NOT DETECTED NOT DETECTED Final   Pseudomonas aeruginosa NOT DETECTED NOT DETECTED Final   Stenotrophomonas maltophilia NOT DETECTED NOT DETECTED Final   Candida albicans NOT DETECTED NOT DETECTED Final   Candida auris NOT DETECTED NOT DETECTED Final   Candida glabrata NOT DETECTED NOT DETECTED Final   Candida krusei NOT DETECTED NOT DETECTED Final    Candida parapsilosis NOT DETECTED NOT DETECTED Final   Candida tropicalis NOT DETECTED NOT DETECTED Final   Cryptococcus neoformans/gattii NOT DETECTED NOT DETECTED Final   CTX-M ESBL NOT DETECTED NOT DETECTED Final   Carbapenem resistance IMP NOT DETECTED NOT DETECTED Final   Carbapenem resistance KPC NOT DETECTED NOT DETECTED Final   Carbapenem resistance NDM NOT DETECTED NOT DETECTED Final   Carbapenem resist OXA 48 LIKE NOT DETECTED NOT DETECTED Final   Carbapenem resistance VIM NOT DETECTED NOT DETECTED Final    Comment: Performed at [facility], [address] [place], [place] [address]  Culture, blood (Routine x 2)     Status: None (Preliminary result)   Collection Time: 12/28/20 10:25 PM   Specimen: BLOOD  Result Value Ref Range Status   Specimen Description BLOOD LEFT HAND  Final   Special Requests   Final    BOTTLES DRAWN AEROBIC AND ANAEROBIC Blood Culture adequate volume   Culture  Setup Time   Final    GRAM NEGATIVE RODS AEROBIC BOTTLE ONLY GRAM POSITIVE COCCI IN CLUSTERS ANAEROBIC BOTTLE ONLY CRITICAL RESULT CALLED TO, READ BACK BY AND VERIFIED WITH: C PIERCE,PHARMD '@2209'$  12/29/20 MKELLY Performed at [facility], [place] [address], [place], [place] [address]    Culture Lonell Grandchild NEGATIVE RODS GRAM POSITIVE COCCI   Final   Report Status PENDING  Incomplete  Blood Culture ID Panel (Reflexed)     Status: Abnormal   Collection Time: 12/28/20 10:25 PM  Result Value Ref Range Status   Enterococcus faecalis NOT DETECTED NOT DETECTED Final   Enterococcus Faecium NOT DETECTED NOT DETECTED Final   Listeria monocytogenes NOT DETECTED NOT DETECTED Final   Staphylococcus species DETECTED (A) NOT DETECTED Final    Comment: CRITICAL RESULT CALLED TO, READ BACK BY AND VERIFIED WITH: C PIERCE,PHARMD'@2209'$  12/29/20 MKELLY    Staphylococcus aureus (BCID) NOT DETECTED NOT DETECTED  Final   Staphylococcus epidermidis NOT DETECTED NOT DETECTED Final   Staphylococcus  lugdunensis NOT DETECTED NOT DETECTED Final   Streptococcus species NOT DETECTED NOT DETECTED Final   Streptococcus agalactiae NOT DETECTED NOT DETECTED Final   Streptococcus pneumoniae NOT DETECTED NOT DETECTED Final   Streptococcus pyogenes NOT DETECTED NOT DETECTED Final   A.calcoaceticus-baumannii NOT DETECTED NOT DETECTED Final   Bacteroides fragilis NOT DETECTED NOT DETECTED Final   Enterobacterales NOT DETECTED NOT DETECTED Final   Enterobacter cloacae complex NOT DETECTED NOT DETECTED Final   Escherichia coli NOT DETECTED NOT DETECTED Final   Klebsiella aerogenes NOT DETECTED NOT DETECTED Final   Klebsiella oxytoca NOT DETECTED NOT DETECTED Final   Klebsiella pneumoniae NOT DETECTED NOT DETECTED Final   Proteus species NOT DETECTED NOT DETECTED Final   Salmonella species NOT DETECTED NOT DETECTED Final   Serratia marcescens NOT DETECTED NOT DETECTED Final   Haemophilus influenzae NOT DETECTED NOT DETECTED Final   Neisseria meningitidis NOT DETECTED NOT DETECTED Final   Pseudomonas aeruginosa NOT DETECTED NOT DETECTED Final   Stenotrophomonas maltophilia NOT DETECTED NOT DETECTED Final   Candida albicans NOT DETECTED NOT DETECTED Final   Candida auris NOT DETECTED NOT DETECTED Final   Candida glabrata NOT DETECTED NOT DETECTED Final   Candida krusei NOT DETECTED NOT DETECTED Final   Candida parapsilosis NOT DETECTED NOT DETECTED Final   Candida tropicalis NOT DETECTED NOT DETECTED Final   Cryptococcus neoformans/gattii NOT DETECTED NOT DETECTED Final    Comment: Performed at [facility], [address] [place], [place] [address]         Radiology Studies: CT HEAD WO CONTRAST (5MM)  Result Date: 12/28/2020 CLINICAL DATA:  Mental status change, unknown cause altered mental status. EXAM: CT HEAD WITHOUT CONTRAST TECHNIQUE: Contiguous axial images were obtained from the base of the skull through the vertex without intravenous contrast. COMPARISON:  None. FINDINGS:  Brain: Normal anatomic configuration. There is moderate parenchymal volume loss, slightly advanced given the patient's age. Remote lacunar infarcts are noted within the left caudate nucleus, left insular cortex, and right thalamus. Periventricular white matter changes are present likely reflecting the sequela of small vessel ischemia. No abnormal intra or extra-axial mass lesion or fluid collection. No abnormal mass effect or midline shift. No evidence of acute intracranial hemorrhage or infarct. Ventricular size is normal. Cerebellum unremarkable. Vascular: No asymmetric hyperdense vasculature at the skull base. Skull: Intact Sinuses/Orbits: Paranasal sinuses are clear. Orbits are unremarkable. Other: Mastoid air cells and middle ear cavities are clear. IMPRESSION: No acute intracranial abnormality. Advanced senescent change. Multiple remote infarcts as noted above. Electronically Signed   By: Fidela Salisbury MD   On: 12/28/2020 23:45   CT ABDOMEN PELVIS W CONTRAST  Result Date: 12/28/2020 CLINICAL DATA:  Abdominal pain, biliary obstruction suspected (Ped 0-18y) EXAM: CT ABDOMEN AND PELVIS WITH CONTRAST TECHNIQUE: Multidetector CT imaging of the abdomen and pelvis was performed using the standard protocol following bolus administration of intravenous contrast. CONTRAST:  137m OMNIPAQUE IOHEXOL 300 MG/ML  SOLN COMPARISON:  04/11/2020 FINDINGS: Lower chest: Small right pleural effusion noted at the visualized right lung base with right basilar atelectasis. Mild left basilar atelectasis. Extensive multi-vessel coronary artery calcification with probable stenting of the left anterior descending coronary artery. Cardiac size is mildly enlarged. No pericardial effusion. Hepatobiliary: The gallbladder is distended, the gallbladder wall is thickened, and there is extensive pericholecystic inflammatory change in keeping with changes of acute cholecystitis. The extrahepatic bile duct is not dilated. There  is no  intrahepatic biliary ductal dilation. The liver is unremarkable. Pancreas: Unremarkable Spleen: Unremarkable Adrenals/Urinary Tract: Adrenal glands are unremarkable. Kidneys are normal, without renal calculi, focal lesion, or hydronephrosis. Bladder is unremarkable. Stomach/Bowel: The stomach, small bowel, and large bowel are unremarkable. The appendix is not visualized and is likely absent. No free intraperitoneal gas or fluid. Vascular/Lymphatic: Mild aortoiliac atherosclerotic calcification. Advanced atherosclerotic calcification is seen within the visualized lower extremity arterial outflow. No aortic aneurysm. No pathologic adenopathy within the abdomen and pelvis. Reproductive: Prostate is unremarkable. Other: Tiny fat containing umbilical hernia.  Rectum unremarkable. Musculoskeletal: No acute bone abnormality. No lytic or blastic bone lesions are seen. IMPRESSION: Acute cholecystitis.  Surgical consultation is advised. Extensive coronary artery calcification.  Mild global cardiomegaly. Aortic Atherosclerosis (ICD10-I70.0). Electronically Signed   By: Fidela Salisbury MD   On: 12/28/2020 23:50   MR ABDOMEN MRCP WO CONTRAST  Result Date: 12/29/2020 CLINICAL DATA:  Biliary obstruction, choledocholithiasis, altered mental status, acute cholecystitis, status post percutaneous cholecystostomy EXAM: MRI ABDOMEN WITHOUT CONTRAST  (INCLUDING MRCP) TECHNIQUE: Multiplanar multisequence MR imaging of the abdomen was performed. Heavily T2-weighted images of the biliary and pancreatic ducts were obtained, and three-dimensional MRCP images were rendered by post processing. COMPARISON:  CT abdomen pelvis, 12/28/2020, percutaneous cholecystostomy, 12/29/2020 FINDINGS: Lower chest: Small bilateral pleural effusions and associated atelectasis or consolidation. Cardiomegaly. Hepatobiliary: Mild hepatic steatosis. No mass or other parenchymal abnormality identified. The gallbladder is distended, with wall thickening and  pericholecystic fluid, containing numerous tiny gallstones. Percutaneous cholecystostomy tube is in place, with formed pigtail in the gallbladder fundus. There is a small calculus in the distal common bile duct measuring 4 mm (series 8, image 27). No biliary ductal dilatation. Mild periportal edema. Pancreas: No mass, inflammatory changes, or other parenchymal abnormality identified. Spleen:  Splenomegaly, maximum coronal span 15.5 cm. Adrenals/Urinary Tract: No masses identified. No evidence of hydronephrosis. Stomach/Bowel: Visualized portions within the abdomen are unremarkable. Vascular/Lymphatic: No pathologically enlarged lymph nodes identified. No abdominal aortic aneurysm demonstrated. Other:  Small volume perihepatic ascites. Musculoskeletal: No suspicious bone lesions identified. IMPRESSION: 1. Percutaneous cholecystostomy tube is in place, with formed pigtail in the gallbladder fundus. 2. The gallbladder is distended, with wall thickening and pericholecystic fluid, containing numerous tiny gallstones. Findings are consistent with acute cholecystitis. 3. There is a 4 mm calculus in the distal common bile duct within the pancreatic head. No biliary ductal dilatation. 4. Mild hepatic steatosis. 5. Splenomegaly. 6. Small volume perihepatic ascites. 7. Small bilateral pleural effusions and associated atelectasis or consolidation. 8. Cardiomegaly. Electronically Signed   By: Eddie Candle M.D.   On: 12/29/2020 13:40   IR Perc Cholecystostomy  Result Date: 12/29/2020 INDICATION: 65 year old male with acute cholecystitis EXAM: ULTRASOUND AND FLUOROSCOPIC-GUIDED CHOLECYSTOSTOMY TUBE PLACEMENT COMPARISON:  CT Abdomen Pelvis, 12/28/2020. MEDICATIONS: The patient is currently admitted to the hospital and on intravenous antibiotics. Antibiotics were administered within an appropriate time frame prior to skin puncture. ANESTHESIA/SEDATION: Moderate (conscious) sedation was employed during this procedure. A total of  Versed 1.5 mg and Fentanyl 75 mcg was administered intravenously. Moderate Sedation Time: 26 minutes. The patient's level of consciousness and vital signs were monitored continuously by radiology nursing throughout the procedure under my direct supervision. CONTRAST:  18m OMNIPAQUE IOHEXOL 300 MG/ML SOLN - administered into the gallbladder fossa. FLUOROSCOPY TIME:  1 minutes 0 seconds (9 mGy) COMPLICATIONS: None immediate. PROCEDURE: Informed written consent was obtained from the patient and/or patient's representative after a discussion of the risks, benefits and alternatives to treatment.  Questions regarding the procedure were encouraged and answered. A timeout was performed prior to the initiation of the procedure. The right upper abdominal quadrant was prepped and draped in the usual sterile fashion, and a sterile drape was applied covering the operative field. Maximum barrier sterile technique with sterile gowns and gloves were used for the procedure. A timeout was performed prior to the initiation of the procedure. Local anesthesia was provided with 1% lidocaine with epinephrine. Ultrasound scanning of the right upper quadrant demonstrates a markedly dilated gallbladder. Utilizing a transhepatic approach, a 22 gauge needle was advanced into the gallbladder under direct ultrasound guidance. An ultrasound image was saved for documentation purposes. Appropriate intraluminal puncture was confirmed with the efflux of bile and advancement of an 0.018 wire into the gallbladder lumen. The needle was exchanged for an [place] set. A small amount of contrast was injected to confirm appropriate intraluminal positioning. Over a [name] wire, a 100.2-French Cook cholecystomy tube was advanced into the gallbladder fossa, coiled and locked. Bile was aspirated and a small amount of contrast was injected as several post procedural spot radiographic images were obtained in various obliquities. The catheter was secured to the  skin with suture, connected to a drainage bag and a dressing was placed. The patient tolerated the procedure well without immediate post procedural complication. IMPRESSION: Successful placement of a 10.2 French cholecystostomy tube, as above. Michaelle Birks, MD [facility] Radiology Electronically Signed   By: Michaelle Birks MD   On: 12/29/2020 16:50   DG CHEST PORT 1 VIEW  Result Date: 12/29/2020 CLINICAL DATA:  Hypoxia EXAM: PORTABLE CHEST 1 VIEW COMPARISON:  Chest x-ray 12/28/2020, CT abdomen pelvis 12/28/2020 FINDINGS: Enlarged cardiac silhouette. The heart size and mediastinal contours are unchanged. Low lung volumes with bibasilar streaky airspace opacities likely representing atelectasis. Elevated right hemidiaphragm no focal consolidation. No pulmonary edema. Trace right pleural effusion. No left pleural effusion. No pneumothorax. No acute osseous abnormality. IMPRESSION: 1. Trace right pleural effusion. 2. Low lung volumes. 3. Elevated right hemidiaphragm. Electronically Signed   By: Iven Finn M.D.   On: 12/29/2020 03:59   DG Chest Portable 1 View  Result Date: 12/28/2020 CLINICAL DATA:  Tachycardia. EXAM: PORTABLE CHEST 1 VIEW COMPARISON:  04/11/2020 FINDINGS: Low lung volumes persist. Stable heart size, likely accentuated by technique and low lung volumes. Unchanged mediastinal contours. Streaky bibasilar atelectasis or scarring. No pulmonary edema, pleural effusion or pneumothorax. Patient's chin partially obscures the right lung apex. No acute osseous abnormalities are seen. IMPRESSION: Low lung volumes with streaky bibasilar atelectasis or scarring. Electronically Signed   By: Keith Rake M.D.   On: 12/28/2020 22:58   US Abdomen Limited RUQ (LIVER/GB)  Result Date: 12/28/2020 CLINICAL DATA:  Abdominal pain. EXAM: ULTRASOUND ABDOMEN LIMITED RIGHT UPPER QUADRANT COMPARISON:  CT abdomen pelvis 04/11/2020 FINDINGS: Gallbladder: Abnormal  gallbladder markedly limited evaluation - question of a heterogeneous structure distending the gallbladder. Common bile duct: Diameter: 4 mm Liver: Markedly limited evaluation. Unable to evaluate for focal hepatic lesion. Normal parenchymal echogenicity. Portal vein is patent on color Doppler imaging with normal direction of blood flow towards the liver. Other: None. IMPRESSION: 1. Abnormal gallbladder with markedly limited/nondiagnostic evaluation - question of a heterogeneous structure distending the gallbladder. Recommend CT abdomen pelvis with intravenous contrast for further evaluation. 2. Markedly limited evaluation of the liver due to decreased penetration of the sonographic waves. Electronically Signed   By: Iven Finn M.D.   On: 12/28/2020 22:45  Scheduled Meds:  gabapentin  300 mg Oral QHS   sodium chloride flush  5 mL Intracatheter Q12H   Continuous Infusions:  sodium chloride 20 mL/hr at 12/30/20 0631   cefTRIAXone (ROCEPHIN)  IV 2 g (12/29/20 2222)   heparin 1,600 Units/hr (12/30/20 0630)   lactated ringers Stopped (12/30/20 0630)   valproate sodium 625 mg (12/29/20 2316)     LOS: 1 day   Time spent= 35 mins    Ramon Zanders Arsenio Loader, MD [facility]  If 7PM-7AM, please contact night-coverage  12/30/2020, 8:35 AM

## 2020-12-30 NOTE — Progress Notes (Signed)
Patient ID: Matthew Gutierrez, male   DOB: 04/03/1956, 65 y.o.   MRN: [id number] Day of Surgery   Subjective: Still somnolent, but will open eyes to being talked to loudly.  Very difficult to understand.  Does still state he has some abdominal pain, but can't localize.  ROS unable given AMS Objective: Vital signs in last 24 hours: Temp:  [97.5 F (36.4 C)-101.5 F (38.6 C)] 97.5 F (36.4 C) (08/03 0748) Pulse Rate:  [62-107] 98 (08/03 0748) Resp:  [12-31] 16 (08/03 0748) BP: (100-171)/(74-102) 166/76 (08/03 0748) SpO2:  [94 %-100 %] 94 % (08/03 0748) Weight:  [131.5 kg] 131.5 kg (08/03 0355) Last BM Date:  (prior to arrival)  Intake/Output from previous day: 08/02 0701 - 08/03 0700 In: 821.3 [I.V.:660; IV Piggyback:156.3] Out: 740 [Urine:700; Drains:40] Intake/Output this shift: No intake/output data recorded.  PE: Gen: somnolent, NAD Abd: soft, difficult to assess tenderness as he doesn't flinch with palpation or indicate where hurts, +BS, perc chole drain in place with minimal output currently.  Lab Results: CBC  Recent Labs    12/29/20 0340 12/30/20 0032  WBC 16.7* 9.3  HGB 11.3* 11.3*  HCT 35.5* 36.2*  PLT 165 142*   BMET Recent Labs    12/29/20 0340 12/30/20 0032  NA 135 138  K 3.5 3.4*  CL 94* 96*  CO2 30 29  GLUCOSE 207* 181*  BUN 28* 28*  CREATININE 1.77* 1.71*  CALCIUM 8.8* 9.1   PT/INR Recent Labs    12/29/20 0340 12/30/20 0032  LABPROT 16.1* 17.1*  INR 1.3* 1.4*   ABG No results for input(s): PHART, HCO3 in the last 72 hours.  Invalid input(s): PCO2, PO2  Studies/Results: CT HEAD WO CONTRAST (5MM)  Result Date: 12/28/2020 CLINICAL DATA:  Mental status change, unknown cause altered mental status. EXAM: CT HEAD WITHOUT CONTRAST TECHNIQUE: Contiguous axial images were obtained from the base of the skull through the vertex without intravenous contrast. COMPARISON:  None. FINDINGS: Brain: Normal anatomic configuration. There is moderate  parenchymal volume loss, slightly advanced given the patient's age. Remote lacunar infarcts are noted within the left caudate nucleus, left insular cortex, and right thalamus. Periventricular white matter changes are present likely reflecting the sequela of small vessel ischemia. No abnormal intra or extra-axial mass lesion or fluid collection. No abnormal mass effect or midline shift. No evidence of acute intracranial hemorrhage or infarct. Ventricular size is normal. Cerebellum unremarkable. Vascular: No asymmetric hyperdense vasculature at the skull base. Skull: Intact Sinuses/Orbits: Paranasal sinuses are clear. Orbits are unremarkable. Other: Mastoid air cells and middle ear cavities are clear. IMPRESSION: No acute intracranial abnormality. Advanced senescent change. Multiple remote infarcts as noted above. Electronically Signed   By: Fidela Salisbury MD   On: 12/28/2020 23:45   CT ABDOMEN PELVIS W CONTRAST  Result Date: 12/28/2020 CLINICAL DATA:  Abdominal pain, biliary obstruction suspected (Ped 0-18y) EXAM: CT ABDOMEN AND PELVIS WITH CONTRAST TECHNIQUE: Multidetector CT imaging of the abdomen and pelvis was performed using the standard protocol following bolus administration of intravenous contrast. CONTRAST:  129m OMNIPAQUE IOHEXOL 300 MG/ML  SOLN COMPARISON:  04/11/2020 FINDINGS: Lower chest: Small right pleural effusion noted at the visualized right lung base with right basilar atelectasis. Mild left basilar atelectasis. Extensive multi-vessel coronary artery calcification with probable stenting of the left anterior descending coronary artery. Cardiac size is mildly enlarged. No pericardial effusion. Hepatobiliary: The gallbladder is distended, the gallbladder wall is thickened, and there is extensive pericholecystic inflammatory change in keeping with changes of  acute cholecystitis. The extrahepatic bile duct is not dilated. There is no intrahepatic biliary ductal dilation. The liver is unremarkable.  Pancreas: Unremarkable Spleen: Unremarkable Adrenals/Urinary Tract: Adrenal glands are unremarkable. Kidneys are normal, without renal calculi, focal lesion, or hydronephrosis. Bladder is unremarkable. Stomach/Bowel: The stomach, small bowel, and large bowel are unremarkable. The appendix is not visualized and is likely absent. No free intraperitoneal gas or fluid. Vascular/Lymphatic: Mild aortoiliac atherosclerotic calcification. Advanced atherosclerotic calcification is seen within the visualized lower extremity arterial outflow. No aortic aneurysm. No pathologic adenopathy within the abdomen and pelvis. Reproductive: Prostate is unremarkable. Other: Tiny fat containing umbilical hernia.  Rectum unremarkable. Musculoskeletal: No acute bone abnormality. No lytic or blastic bone lesions are seen. IMPRESSION: Acute cholecystitis.  Surgical consultation is advised. Extensive coronary artery calcification.  Mild global cardiomegaly. Aortic Atherosclerosis (ICD10-I70.0). Electronically Signed   By: Fidela Salisbury MD   On: 12/28/2020 23:50   MR ABDOMEN MRCP WO CONTRAST  Result Date: 12/29/2020 CLINICAL DATA:  Biliary obstruction, choledocholithiasis, altered mental status, acute cholecystitis, status post percutaneous cholecystostomy EXAM: MRI ABDOMEN WITHOUT CONTRAST  (INCLUDING MRCP) TECHNIQUE: Multiplanar multisequence MR imaging of the abdomen was performed. Heavily T2-weighted images of the biliary and pancreatic ducts were obtained, and three-dimensional MRCP images were rendered by post processing. COMPARISON:  CT abdomen pelvis, 12/28/2020, percutaneous cholecystostomy, 12/29/2020 FINDINGS: Lower chest: Small bilateral pleural effusions and associated atelectasis or consolidation. Cardiomegaly. Hepatobiliary: Mild hepatic steatosis. No mass or other parenchymal abnormality identified. The gallbladder is distended, with wall thickening and pericholecystic fluid, containing numerous tiny gallstones. Percutaneous  cholecystostomy tube is in place, with formed pigtail in the gallbladder fundus. There is a small calculus in the distal common bile duct measuring 4 mm (series 8, image 27). No biliary ductal dilatation. Mild periportal edema. Pancreas: No mass, inflammatory changes, or other parenchymal abnormality identified. Spleen:  Splenomegaly, maximum coronal span 15.5 cm. Adrenals/Urinary Tract: No masses identified. No evidence of hydronephrosis. Stomach/Bowel: Visualized portions within the abdomen are unremarkable. Vascular/Lymphatic: No pathologically enlarged lymph nodes identified. No abdominal aortic aneurysm demonstrated. Other:  Small volume perihepatic ascites. Musculoskeletal: No suspicious bone lesions identified. IMPRESSION: 1. Percutaneous cholecystostomy tube is in place, with formed pigtail in the gallbladder fundus. 2. The gallbladder is distended, with wall thickening and pericholecystic fluid, containing numerous tiny gallstones. Findings are consistent with acute cholecystitis. 3. There is a 4 mm calculus in the distal common bile duct within the pancreatic head. No biliary ductal dilatation. 4. Mild hepatic steatosis. 5. Splenomegaly. 6. Small volume perihepatic ascites. 7. Small bilateral pleural effusions and associated atelectasis or consolidation. 8. Cardiomegaly. Electronically Signed   By: Eddie Candle M.D.   On: 12/29/2020 13:40   IR Perc Cholecystostomy  Result Date: 12/29/2020 INDICATION: 65 year old male with acute cholecystitis EXAM: ULTRASOUND AND FLUOROSCOPIC-GUIDED CHOLECYSTOSTOMY TUBE PLACEMENT COMPARISON:  CT Abdomen Pelvis, 12/28/2020. MEDICATIONS: The patient is currently admitted to the hospital and on intravenous antibiotics. Antibiotics were administered within an appropriate time frame prior to skin puncture. ANESTHESIA/SEDATION: Moderate (conscious) sedation was employed during this procedure. A total of Versed 1.5 mg and Fentanyl 75 mcg was administered intravenously. Moderate  Sedation Time: 26 minutes. The patient's level of consciousness and vital signs were monitored continuously by radiology nursing throughout the procedure under my direct supervision. CONTRAST:  50m OMNIPAQUE IOHEXOL 300 MG/ML SOLN - administered into the gallbladder fossa. FLUOROSCOPY TIME:  1 minutes 0 seconds (9 mGy) COMPLICATIONS: None immediate. PROCEDURE: Informed written consent was obtained from the patient and/or patient's representative after  a discussion of the risks, benefits and alternatives to treatment. Questions regarding the procedure were encouraged and answered. A timeout was performed prior to the initiation of the procedure. The right upper abdominal quadrant was prepped and draped in the usual sterile fashion, and a sterile drape was applied covering the operative field. Maximum barrier sterile technique with sterile gowns and gloves were used for the procedure. A timeout was performed prior to the initiation of the procedure. Local anesthesia was provided with 1% lidocaine with epinephrine. Ultrasound scanning of the right upper quadrant demonstrates a markedly dilated gallbladder. Utilizing a transhepatic approach, a 22 gauge needle was advanced into the gallbladder under direct ultrasound guidance. An ultrasound image was saved for documentation purposes. Appropriate intraluminal puncture was confirmed with the efflux of bile and advancement of an 0.018 wire into the gallbladder lumen. The needle was exchanged for an [place] set. A small amount of contrast was injected to confirm appropriate intraluminal positioning. Over a [name] wire, a 51.2-French Cook cholecystomy tube was advanced into the gallbladder fossa, coiled and locked. Bile was aspirated and a small amount of contrast was injected as several post procedural spot radiographic images were obtained in various obliquities. The catheter was secured to the skin with suture, connected to a drainage bag and a dressing was placed. The  patient tolerated the procedure well without immediate post procedural complication. IMPRESSION: Successful placement of a 10.2 French cholecystostomy tube, as above. Michaelle Birks, MD [facility] Radiology Electronically Signed   By: Michaelle Birks MD   On: 12/29/2020 16:50   DG CHEST PORT 1 VIEW  Result Date: 12/29/2020 CLINICAL DATA:  Hypoxia EXAM: PORTABLE CHEST 1 VIEW COMPARISON:  Chest x-ray 12/28/2020, CT abdomen pelvis 12/28/2020 FINDINGS: Enlarged cardiac silhouette. The heart size and mediastinal contours are unchanged. Low lung volumes with bibasilar streaky airspace opacities likely representing atelectasis. Elevated right hemidiaphragm no focal consolidation. No pulmonary edema. Trace right pleural effusion. No left pleural effusion. No pneumothorax. No acute osseous abnormality. IMPRESSION: 1. Trace right pleural effusion. 2. Low lung volumes. 3. Elevated right hemidiaphragm. Electronically Signed   By: Iven Finn M.D.   On: 12/29/2020 03:59   DG Chest Portable 1 View  Result Date: 12/28/2020 CLINICAL DATA:  Tachycardia. EXAM: PORTABLE CHEST 1 VIEW COMPARISON:  04/11/2020 FINDINGS: Low lung volumes persist. Stable heart size, likely accentuated by technique and low lung volumes. Unchanged mediastinal contours. Streaky bibasilar atelectasis or scarring. No pulmonary edema, pleural effusion or pneumothorax. Patient's chin partially obscures the right lung apex. No acute osseous abnormalities are seen. IMPRESSION: Low lung volumes with streaky bibasilar atelectasis or scarring. Electronically Signed   By: Keith Rake M.D.   On: 12/28/2020 22:58   US Abdomen Limited RUQ (LIVER/GB)  Result Date: 12/28/2020 CLINICAL DATA:  Abdominal pain. EXAM: ULTRASOUND ABDOMEN LIMITED RIGHT UPPER QUADRANT COMPARISON:  CT abdomen pelvis 04/11/2020 FINDINGS: Gallbladder: Abnormal gallbladder markedly limited evaluation - question of a heterogeneous structure  distending the gallbladder. Common bile duct: Diameter: 4 mm Liver: Markedly limited evaluation. Unable to evaluate for focal hepatic lesion. Normal parenchymal echogenicity. Portal vein is patent on color Doppler imaging with normal direction of blood flow towards the liver. Other: None. IMPRESSION: 1. Abnormal gallbladder with markedly limited/nondiagnostic evaluation - question of a heterogeneous structure distending the gallbladder. Recommend CT abdomen pelvis with intravenous contrast for further evaluation. 2. Markedly limited evaluation of the liver due to decreased penetration of the sonographic waves. Electronically Signed   By: Thomasena Edis  Mckinley Jewel M.D.   On: 12/28/2020 22:45    Anti-infectives: Anti-infectives (From admission, onward)    Start     Dose/Rate Route Frequency Ordered Stop   12/29/20 2200  vancomycin (VANCOREADY) IVPB 1250 mg/250 mL  Status:  Discontinued        1,250 mg 166.7 mL/hr over 90 Minutes Intravenous Every 24 hours 12/29/20 0006 12/29/20 1831   12/29/20 2100  cefTRIAXone (ROCEPHIN) 2 g in sodium chloride 0.9 % 100 mL IVPB        2 g 200 mL/hr over 30 Minutes Intravenous Every 24 hours 12/29/20 1831     12/29/20 1000  ceFEPIme (MAXIPIME) 2 g in sodium chloride 0.9 % 100 mL IVPB  Status:  Discontinued        2 g 200 mL/hr over 30 Minutes Intravenous Every 12 hours 12/29/20 0200 12/29/20 1831   12/29/20 0600  piperacillin-tazobactam (ZOSYN) IVPB 3.375 g  Status:  Discontinued       See Hyperspace for full Linked Orders Report.   3.375 g 12.5 mL/hr over 240 Minutes Intravenous Every 8 hours 12/28/20 2244 12/29/20 0200   12/29/20 0200  metroNIDAZOLE (FLAGYL) IVPB 500 mg  Status:  Discontinued        500 mg 100 mL/hr over 60 Minutes Intravenous Every 8 hours 12/29/20 0154 12/29/20 1831   12/29/20 0000  vancomycin (VANCOREADY) IVPB 1250 mg/250 mL  Status:  Discontinued        1,250 mg 166.7 mL/hr over 90 Minutes Intravenous Every 24 hours 12/28/20 2246 12/29/20 0006    12/28/20 2245  vancomycin (VANCOCIN) 2,500 mg in sodium chloride 0.9 % 500 mL IVPB        2,500 mg 250 mL/hr over 120 Minutes Intravenous  Once 12/28/20 2244 12/29/20 0205   12/28/20 2245  piperacillin-tazobactam (ZOSYN) IVPB 3.375 g       See Hyperspace for full Linked Orders Report.   3.375 g 100 mL/hr over 30 Minutes Intravenous  Once 12/28/20 2244 12/28/20 2319       Assessment/Plan: Cholecystitis/choledocholithiasis  -IR perc chole drain on 8/2 -WBC normalized, vitals stable, but did have a temp of 101.5 this am -cont abx therapy -MRCP + for CBD stone, tentatively planned for ERCP this afternoon per GI -TB still elevated, but slightly down to 4 today. -may at least have CLD after ERCP if awake enough to drink  FEN - NPO/IVFs ID - Rocephin VTE - heparin  A fib Chronic respiratory failure DM HTN H/O MI H/O CVA H/O DVT OSA PVD   LOS: 1 day    Henreitta Cea, PA-C Trauma & General Surgery Use [email or web address] to contact on call provider  12/30/2020

## 2020-12-30 NOTE — Consult Note (Addendum)
[facility] Nurse Consult Note: Patient receiving care in [facility] 4E10 Reason for Consult: BL feet and ankles Wound type: Unstageable wounds to the BL heels and lateral side of the feet Pressure Injury POA: Yes Measurement: Right Lateral Malleolus: 3.5 cm x 2 cm x 0.3 cm black/brown surrounded by a pink/yellow ring Right medial longitudinal arch: 1 cm x 1.5 cm x 0.1 cm black surrounded by erythematous base Left lateral longitudinal arch: 3 cm x 1 cm x 0.1 cm black surrounded by pink/yellow ring with erythema Left heel medial side: 2 cm x 1 cm x 0.2 cm, black surrounded by erythema Wound bed: Drainage (amount, consistency, odor) None on foam dressings Dressing procedure/placement/frequency: Clean BL legs and feet with soap and water, rinse and pat dry. Apply Santyl in a nickle thick layer to all the wounds. Cover with a moistened 2 x 2 gauze then dry gauze and secure with heel foam dressing. This should be completed daily. Apply Sween (pink top in clean supply) moisturizing lotion to both legs.  The patient is currently on a low air loss mattress and has Prevalon Heel Lift boots in place.   Monitor the wound area(s) for worsening of condition such as: Signs/symptoms of infection, increase in size, development of or worsening of odor, development of pain, or increased pain at the affected locations.   Notify the medical team if any of these develop.  Thank you for the consult. [place] nurse will not follow at this time.   Please re-consult the [place] team if needed.  Cathlean Marseilles Tamala Julian, MSN, RN, [place], Lysle Pearl, [facility] Wound Treatment Associate Pager [phone]

## 2020-12-30 NOTE — Evaluation (Addendum)
Physical Therapy Evaluation Patient Details Name: Matthew Gutierrez MRN: [id number] DOB: 1955-08-01 Today's Date: 12/30/2020   History of Present Illness  Matthew Gutierrez is a 65 y.o. male with medical history significant for DMT2, CKD 3, a-fib, COPD, neuropathy due to diabetes, history of stroke, OSA, PVD who presents by EMS from skilled nursing facility for evaluation of altered mental status with hypoxia.  Reportedly patient is normally conversational at his baseline. Patient is found to be febrile with a temperature of 101.4 degrees.  He is also found to be septic with elevated lactic acid, encephalopathy, tachycardia, tachypnea and imaging revealed acute cholecystitis.  He has elevated bilirubin level.  Surgery was consulted and recommended MRCP in light of his clinical situation.   Clinical Impression  Patient received in supine, sleeping. Opens eyes to me calling his name. He is able to tell me his first name, but nothing else accurately. Patient able to follow cues to raise R UE up off bed, and squeeze my hand, but does not move left UE. Unable to lift either leg off bed or move much at all in bed. He requires max of 2 for bed mobility at this time.  Per chart, patient is normally able to get into recliner with assistance. Patient will continue to benefit from skilled PT while here to improve mobility and strength.     Follow Up Recommendations SNF;Supervision/Assistance - 24 hour    Equipment Recommendations  None recommended by PT    Recommendations for Other Services OT consult     Precautions / Restrictions Precautions Precautions: Fall Restrictions Weight Bearing Restrictions: No Other Position/Activity Restrictions: patient has B foot/heel wounds, pressure relief boots donned.      Mobility  Bed Mobility Overal bed mobility: Needs Assistance Bed Mobility: Rolling Rolling: Max assist;+2 for physical assistance         General bed mobility comments: patient with limited  ability to follow direction at this time. Requires +2 assist for all bed mobility currently.    Transfers                 General transfer comment: unable at this time  Ambulation/Gait             General Gait Details: unable  Stairs            Wheelchair Mobility    Modified Rankin (Stroke Patients Only)       Balance                                             Pertinent Vitals/Pain Pain Assessment: Faces Faces Pain Scale: No hurt    Home Living Family/patient expects to be discharged to:: Skilled nursing facility                      Prior Function           Comments: Unsure. Patient is poor historian. Patient was apparently able to get up and into recliner per chart.     Hand Dominance        Extremity/Trunk Assessment   Upper Extremity Assessment Upper Extremity Assessment: Difficult to assess due to impaired cognition;LUE deficits/detail LUE Deficits / Details: increased tone and weakness in L UE LUE Coordination: decreased gross motor    Lower Extremity Assessment Lower Extremity Assessment: Generalized weakness;Difficult to assess due to impaired cognition  Communication      Cognition Arousal/Alertness: Lethargic Behavior During Therapy: Flat affect Overall Cognitive Status: No family/caregiver present to determine baseline cognitive functioning                                 General Comments: patient is poor historian, tends to just answer "yes" to questions. Unable to tell me much other than his name.      General Comments General comments (skin integrity, edema, etc.): B foot/heel wounds    Exercises     Assessment/Plan    PT Assessment Patient needs continued PT services  PT Problem List Decreased strength;Decreased mobility;Decreased activity tolerance;Decreased cognition;Impaired tone;Decreased range of motion       PT Treatment Interventions Therapeutic  exercise;Functional mobility training;Therapeutic activities;Patient/family education;Neuromuscular re-education    PT Goals (Current goals can be found in the Care Plan section)  Acute Rehab PT Goals Patient Stated Goal: none stated/unable PT Goal Formulation: Patient unable to participate in goal setting Time For Goal Achievement: 01/13/21    Frequency Min 2X/week   Barriers to discharge        Co-evaluation               AM-PAC PT "6 Clicks" Mobility  Outcome Measure Help needed turning from your back to your side while in a flat bed without using bedrails?: Total Help needed moving from lying on your back to sitting on the side of a flat bed without using bedrails?: Total Help needed moving to and from a bed to a chair (including a wheelchair)?: Total Help needed standing up from a chair using your arms (e.g., wheelchair or bedside chair)?: Total Help needed to walk in hospital room?: Total Help needed climbing 3-5 steps with a railing? : Total 6 Click Score: 6    End of Session Equipment Utilized During Treatment: Oxygen Activity Tolerance: Patient limited by lethargy;Other (comment) (cognitive limitations at this time) Patient left: in bed;with call bell/phone within reach   PT Visit Diagnosis: Other abnormalities of gait and mobility (R26.89);Muscle weakness (generalized) (M62.81)    Time: [id number] PT Time Calculation (min) (ACUTE ONLY): 14 min   Charges:   PT Evaluation $PT Eval Moderate Complexity: 1 Mod          Cloe Sockwell, PT, GCS 12/30/20,12:52 PM

## 2020-12-30 NOTE — Anesthesia Preprocedure Evaluation (Addendum)
Anesthesia Evaluation  Patient identified by MRN, date of birth, ID band Patient unresponsive    Reviewed: Allergy & Precautions, NPO status , Patient's Chart, lab work & pertinent test results, reviewed documented beta blocker date and time   Airway Mallampati: III  TM Distance: >3 FB Neck ROM: Full    Dental  (+) Teeth Intact, Dental Advisory Given   Pulmonary sleep apnea , COPD, former smoker,    Pulmonary exam normal breath sounds clear to auscultation       Cardiovascular hypertension, Pt. on home beta blockers and Pt. on medications + CAD and + Peripheral Vascular Disease  Normal cardiovascular exam+ dysrhythmias Atrial Fibrillation  Rhythm:Regular Rate:Normal     Neuro/Psych Seizures -,  PSYCHIATRIC DISORDERS Schizophrenia CVA, Residual Symptoms    GI/Hepatic negative GI ROS, Choledocholithiasis   Endo/Other  diabetes, Type 2, Oral Hypoglycemic AgentsMorbid obesity  Renal/GU Renal InsufficiencyRenal disease     Musculoskeletal negative musculoskeletal ROS (+)   Abdominal   Peds  Hematology  (+) Blood dyscrasia (Coumadin; Thrombocytopenia), anemia ,   Anesthesia Other Findings Day of surgery medications reviewed with the patient.  Reproductive/Obstetrics                            Anesthesia Physical Anesthesia Plan  ASA: 4  Anesthesia Plan: General   Post-op Pain Management:    Induction: Intravenous  PONV Risk Score and Plan: 2 and Dexamethasone and Ondansetron  Airway Management Planned: Oral ETT  Additional Equipment:   Intra-op Plan:   Post-operative Plan: Extubation in OR  Informed Consent: I have reviewed the patients History and Physical, chart, labs and discussed the procedure including the risks, benefits and alternatives for the proposed anesthesia with the patient or authorized representative who has indicated his/her understanding and acceptance.      Dental advisory given, History available from chart only and Consent reviewed with POA  Plan Discussed with: CRNA  Anesthesia Plan Comments: (Consent over phone with patient's sister Denver Kibbey.)        Anesthesia Quick Evaluation

## 2020-12-30 NOTE — Transfer of Care (Signed)
Immediate Anesthesia Transfer of Care Note  Patient: Matthew Gutierrez  Procedure(s) Performed: ENDOSCOPIC RETROGRADE CHOLANGIOPANCREATOGRAPHY (ERCP) REMOVAL OF STONES SPHINCTEROTOMY  Patient Location: PACU  Anesthesia Type:General  Level of Consciousness: lethargic and responds to stimulation  Airway & Oxygen Therapy: Patient Spontanous Breathing and Patient connected to face mask oxygen  Post-op Assessment: Report given to RN  Post vital signs: Reviewed and stable  Last Vitals:  Vitals Value Taken Time  BP    Temp    Pulse    Resp    SpO2 99     Last Pain:  Vitals:   12/30/20 1452  TempSrc: Oral  PainSc: 0-No pain         Complications: No notable events documented.

## 2020-12-30 NOTE — Op Note (Addendum)
[facility] Patient Name: Matthew Gutierrez Procedure Date : 12/30/2020 MRN: [id number] Attending MD: Ladene Artist , MD Date of Birth: 17-Jul-1955 CSN: [id number] Age: 65 Admit Type: Inpatient Procedure:                ERCP Indications:              Bile duct stone on MRCP, Exclusion of ascending                            cholangitis, Elevated liver enzymes Providers:                Pricilla Riffle. Fuller Plan, MD, Doristine Johns, RN, Elspeth Cho Tech., Technician, Sampson Si, CRNA Referring MD:             [facility] Medicines:                General Anesthesia Complications:            No immediate complications. Estimated Blood Loss:     Estimated blood loss was minimal. Procedure:                Pre-Anesthesia Assessment:                           - Prior to the procedure, a History and Physical                            was performed, and patient medications and                            allergies were reviewed. The patient's tolerance of                            previous anesthesia was also reviewed. The risks                            and benefits of the procedure and the sedation                            options and risks were discussed with the patient.                            All questions were answered, and informed consent                            was obtained. Prior Anticoagulants: The patient has                            taken Coumadin (warfarin), last dose was 3 days                            prior to procedure. ASA Grade Assessment: III - A  patient with severe systemic disease. After                            reviewing the risks and benefits, the patient was                            deemed in satisfactory condition to undergo the                            procedure.                           After obtaining informed consent, the scope was                            passed under direct vision.  Throughout the                            procedure, the patient's blood pressure, pulse, and                            oxygen saturations were monitored continuously. The                            [id number] Olympus duodenoscope was                            introduced through the mouth, and used to inject                            contrast into and used to inject contrast into the                            bile duct and ventral pancreatic duct. After prior                            to scope insertion there was insufficient                            ventilation in the prone position. The patient was                            placed in the supine position and adequate                            ventilation was established. The decision was made                            with anesthesia to perform the ERCP in the left                            lateral decubitus position to maintain appropriate  ventilation. The ERCP was somewhat difficult due to                            inadequate patient positioning. The patient                            tolerated the procedure well. Scope In: Scope Out: Findings:      A scout film of the abdomen was obtained. A tube, consistent with the       cholecystostomy tube placed yesterday, was seen in the area of the right       upper quadrant of the abdomen. The scope was advanced to a normal major       papilla in the descending duodenum. Limited examination of the pharynx,       larynx and associated structures, and upper GI tract was normal. The       minor papilla was not seen. The major papilla was normal. Positioning       for cannulation and anatomic orientation was challenging with the       patient in the left lateral decubitus position. A straight Roadrunner       wire was initially passed into the PD and then into the biliary tree.       The short-nosed traction sphincterotome was passed over the  guidewire,       the PD was cannulated and contrast was injected to assist with anatomic       orientation. The partial pancreatogram appeared normal. The bile duct       was then cannulated with the guidewire and then deeply cannulated with       the sphinctertome. Contrast was injected. I personally interpreted the       bile duct and pancreatic duct images. Ductal flow of contrast was       adequate. The common bile duct contained one stone, which was 5 mm in       diameter. The biliary tree was otherwise normal. The CBD measured 7-8 mm       in diameter. The gallbladder was partially filled. An 8 mm biliary       sphincterotomy was made with a traction (standard) sphincterotome using       ERBE electrocautery. Minor bleeding from the sphincterotomy was       successfully treated with injection of 3 cc of 1:10,000 epinephrine. No       bleeding at the end of the procedure. The biliary tree was swept several       times with a 9 mm balloon starting at the bifurcation. Sludge and pus       were swept from the duct. One 5 mm stone was removed. No stones       remained. Good biliary drainage was noted. Impression:               - The major papilla appeared normal.                           - Choledocholithiasis was found. Complete removal                            was accomplished by biliary sphincterotomy and  balloon extraction.                           - A biliary sphincterotomy was performed.                           - The biliary tree was swept.                           - Sludge and pus were removed from the CBD.                           - Epinephrine injection at sphincterotomy to                            control bleeding.                           - Cholecystostomy tube in place.                           - Normal partial pancreatogram. Recommendation:           - Return patient to ICU/hospital ward for ongoing                            care.                            - Observe patient's clinical course following                            today's ERCP with therapeutic intervention.                           - Avoid aspirin and nonsteroidal anti-inflammatory                            medicines for 10 days.                           - OK to resume IV heparin without a bolus no sooner                            than 6 hours from now as clinically indicated.                           - OK to resume Coumadin no sooner than 5 days from                            now as clinically indicated.                           - Continue IV antibiotics.                           - Surgical consult following to manage  acute                            cholecystitis, cholelithiasis, cholecystostomy tube. Procedure Code(s):        --- Professional ---                           [phone], Endoscopic retrograde                            cholangiopancreatography (ERCP); with removal of                            calculi/debris from biliary/pancreatic duct(s)                           43262, Endoscopic retrograde                            cholangiopancreatography (ERCP); with                            sphincterotomy/papillotomy Diagnosis Code(s):        --- Professional ---                           K80.50, Calculus of bile duct without cholangitis                            or cholecystitis without obstruction                           R74.8, Abnormal levels of other serum enzymes CPT copyright 2019 [employer]. All rights reserved. The codes documented in this report are preliminary and upon coder review may  be revised to meet current compliance requirements. Ladene Artist, MD 12/30/2020 5:50:12 PM This report has been signed electronically. Number of Addenda: 0

## 2020-12-30 NOTE — Progress Notes (Signed)
Assessed wounds on ankles and feet. Wounds were covered with pink foams from nursing facility? Patient also had facility's version of prevalon boots.   Ordered air mattress and prevalon boots (our version).   Wounds were cleansed, measured, covered with foam heel protective and wound consult placed.

## 2020-12-30 NOTE — Progress Notes (Signed)
CPAP not in room at this time. Patient still unable to pull mask off independently.

## 2020-12-30 NOTE — Interval H&P Note (Signed)
History and Physical Interval Note:  12/30/2020 3:36 PM  Matthew Gutierrez  has presented today for surgery, with the diagnosis of Choledocholithiasis.  The various methods of treatment have been discussed with the patient and family. After consideration of risks, benefits and other options for treatment, the patient has consented to  Procedure(s): ENDOSCOPIC RETROGRADE CHOLANGIOPANCREATOGRAPHY (ERCP) (N/A) as a surgical intervention.  The patient's history has been reviewed, patient examined, no change in status, stable for surgery.  I have reviewed the patient's chart and labs.  Questions were answered to the patient's satisfaction.     Pricilla Riffle. Fuller Plan

## 2020-12-31 ENCOUNTER — Inpatient Hospital Stay (HOSPITAL_COMMUNITY): Payer: No Typology Code available for payment source

## 2020-12-31 ENCOUNTER — Encounter (HOSPITAL_COMMUNITY): Payer: Self-pay | Admitting: Gastroenterology

## 2020-12-31 DIAGNOSIS — K810 Acute cholecystitis: Secondary | ICD-10-CM

## 2020-12-31 DIAGNOSIS — K8033 Calculus of bile duct with acute cholangitis with obstruction: Secondary | ICD-10-CM | POA: Diagnosis not present

## 2020-12-31 DIAGNOSIS — I5022 Chronic systolic (congestive) heart failure: Secondary | ICD-10-CM

## 2020-12-31 DIAGNOSIS — I480 Paroxysmal atrial fibrillation: Secondary | ICD-10-CM

## 2020-12-31 DIAGNOSIS — I10 Essential (primary) hypertension: Secondary | ICD-10-CM

## 2020-12-31 DIAGNOSIS — E785 Hyperlipidemia, unspecified: Secondary | ICD-10-CM

## 2020-12-31 LAB — BLOOD GAS, ARTERIAL
Acid-Base Excess: 0.4 mmol/L (ref 0.0–2.0)
Acid-base deficit: 1.3 mmol/L (ref 0.0–2.0)
Bicarbonate: 28.1 mmol/L — ABNORMAL HIGH (ref 20.0–28.0)
Bicarbonate: 24 mmol/L (ref 20.0–28.0)
Drawn by: 38235
Drawn by: 38235
FIO2: 100
FIO2: 100
O2 Saturation: 79.9 %
O2 Saturation: 99.3 %
Patient temperature: 37
Patient temperature: 37
pCO2 arterial: 79.5 mmHg (ref 32.0–48.0)
pCO2 arterial: 48 mmHg (ref 32.0–48.0)
pH, Arterial: 7.174 — CL (ref 7.350–7.450)
pH, Arterial: 7.319 — ABNORMAL LOW (ref 7.350–7.450)
pO2, Arterial: 56.6 mmHg — ABNORMAL LOW (ref 83.0–108.0)
pO2, Arterial: 261 mmHg — ABNORMAL HIGH (ref 83.0–108.0)

## 2020-12-31 LAB — COMPREHENSIVE METABOLIC PANEL
ALT: 28 U/L (ref 0–44)
AST: 67 U/L — ABNORMAL HIGH (ref 15–41)
Albumin: 1.5 g/dL — ABNORMAL LOW (ref 3.5–5.0)
Alkaline Phosphatase: 212 U/L — ABNORMAL HIGH (ref 38–126)
Anion gap: 13 (ref 5–15)
BUN: 35 mg/dL — ABNORMAL HIGH (ref 8–23)
CO2: 26 mmol/L (ref 22–32)
Calcium: 8.8 mg/dL — ABNORMAL LOW (ref 8.9–10.3)
Chloride: 99 mmol/L (ref 98–111)
Creatinine, Ser: 2.13 mg/dL — ABNORMAL HIGH (ref 0.61–1.24)
GFR, Estimated: 34 mL/min — ABNORMAL LOW (ref >60)
Glucose, Bld: 238 mg/dL — ABNORMAL HIGH (ref 70–99)
Potassium: 4.1 mmol/L (ref 3.5–5.1)
Sodium: 138 mmol/L (ref 135–145)
Total Bilirubin: 4.7 mg/dL — ABNORMAL HIGH (ref 0.3–1.2)
Total Protein: 6.2 g/dL — ABNORMAL LOW (ref 6.5–8.1)

## 2020-12-31 LAB — HEPARIN LEVEL (UNFRACTIONATED): Heparin Unfractionated: 0.12 IU/mL — ABNORMAL LOW (ref 0.30–0.70)

## 2020-12-31 LAB — BRAIN NATRIURETIC PEPTIDE: B Natriuretic Peptide: 374.1 pg/mL — ABNORMAL HIGH (ref 0.0–100.0)

## 2020-12-31 LAB — CBC
HCT: 33.9 % — ABNORMAL LOW (ref 39.0–52.0)
Hemoglobin: 9.9 g/dL — ABNORMAL LOW (ref 13.0–17.0)
MCH: 26.5 pg (ref 26.0–34.0)
MCHC: 29.2 g/dL — ABNORMAL LOW (ref 30.0–36.0)
MCV: 90.9 fL (ref 80.0–100.0)
Platelets: 142 10*3/uL — ABNORMAL LOW (ref 150–400)
RBC: 3.73 MIL/uL — ABNORMAL LOW (ref 4.22–5.81)
RDW: 16.6 % — ABNORMAL HIGH (ref 11.5–15.5)
WBC: 13.1 10*3/uL — ABNORMAL HIGH (ref 4.0–10.5)
nRBC: 0 % (ref 0.0–0.2)

## 2020-12-31 LAB — PROTIME-INR
INR: 1.4 — ABNORMAL HIGH (ref 0.8–1.2)
Prothrombin Time: 17.4 seconds — ABNORMAL HIGH (ref 11.4–15.2)

## 2020-12-31 LAB — GLUCOSE, CAPILLARY
Glucose-Capillary: 180 mg/dL — ABNORMAL HIGH (ref 70–99)
Glucose-Capillary: 206 mg/dL — ABNORMAL HIGH (ref 70–99)
Glucose-Capillary: 208 mg/dL — ABNORMAL HIGH (ref 70–99)
Glucose-Capillary: 209 mg/dL — ABNORMAL HIGH (ref 70–99)
Glucose-Capillary: 210 mg/dL — ABNORMAL HIGH (ref 70–99)
Glucose-Capillary: 213 mg/dL — ABNORMAL HIGH (ref 70–99)
Glucose-Capillary: 223 mg/dL — ABNORMAL HIGH (ref 70–99)
Glucose-Capillary: 244 mg/dL — ABNORMAL HIGH (ref 70–99)

## 2020-12-31 LAB — CULTURE, BLOOD (ROUTINE X 2): Special Requests: ADEQUATE

## 2020-12-31 LAB — MAGNESIUM: Magnesium: 1.8 mg/dL (ref 1.7–2.4)

## 2020-12-31 LAB — PHENYTOIN LEVEL, TOTAL: Phenytoin Lvl: <2.5 ug/mL — ABNORMAL LOW (ref 10.0–20.0)

## 2020-12-31 MED ORDER — MAGNESIUM SULFATE 4 GM/100ML IV SOLN
4.0000 g | Freq: Once | INTRAVENOUS | Status: AC
  Administered 2020-12-31: 4 g via INTRAVENOUS
  Filled 2020-12-31: qty 100

## 2020-12-31 MED ORDER — SODIUM CHLORIDE 0.9 % IV BOLUS
1000.0000 mL | Freq: Once | INTRAVENOUS | Status: AC
  Administered 2020-12-31: 1000 mL via INTRAVENOUS

## 2020-12-31 MED ORDER — SODIUM CHLORIDE 0.9 % IV BOLUS: 500.0000 mL | Freq: Once | INTRAVENOUS | Status: DC

## 2020-12-31 MED ORDER — SODIUM CHLORIDE 0.9 % IV BOLUS
500.0000 mL | Freq: Once | INTRAVENOUS | Status: AC
  Administered 2020-12-31: 500 mL via INTRAVENOUS

## 2020-12-31 MED ORDER — SODIUM CHLORIDE 3 % IN NEBU
4.0000 mL | INHALATION_SOLUTION | Freq: Once | RESPIRATORY_TRACT | Status: AC | PRN
  Administered 2020-12-31: 4 mL via RESPIRATORY_TRACT
  Filled 2020-12-31: qty 4

## 2020-12-31 MED ORDER — METOPROLOL TARTRATE 12.5 MG HALF TABLET: 12.5000 mg | ORAL_TABLET | Freq: Four times a day (QID) | ORAL | Status: DC

## 2020-12-31 MED ORDER — SODIUM CHLORIDE 0.9 % IV SOLN
2.0000 g | INTRAVENOUS | Status: DC
  Administered 2020-12-31 – 2021-01-04 (×5): 2 g via INTRAVENOUS
  Filled 2020-12-31 (×7): qty 20

## 2020-12-31 MED ORDER — PHENYTOIN SODIUM 50 MG/ML IJ SOLN
100.0000 mg | Freq: Two times a day (BID) | INTRAMUSCULAR | Status: DC
  Administered 2020-12-31 – 2021-01-06 (×12): 100 mg via INTRAVENOUS
  Filled 2020-12-31 (×12): qty 2

## 2020-12-31 MED ORDER — AMIODARONE IV BOLUS ONLY 150 MG/100ML
150.0000 mg | Freq: Once | INTRAVENOUS | Status: AC
  Administered 2020-12-31: 150 mg via INTRAVENOUS

## 2020-12-31 MED ORDER — AMIODARONE IV BOLUS ONLY 150 MG/100ML
150.0000 mg | Freq: Once | INTRAVENOUS | Status: AC
  Administered 2020-12-31: 150 mg via INTRAVENOUS
  Filled 2020-12-31: qty 100

## 2020-12-31 MED ORDER — AMIODARONE HCL IN DEXTROSE 360-4.14 MG/200ML-% IV SOLN
60.0000 mg/h | INTRAVENOUS | Status: AC
  Administered 2020-12-31 (×2): 60 mg/h via INTRAVENOUS
  Filled 2020-12-31: qty 200

## 2020-12-31 MED ORDER — VALPROATE SODIUM 100 MG/ML IV SOLN
300.0000 mg | Freq: Four times a day (QID) | INTRAVENOUS | Status: DC
  Administered 2020-12-31 – 2021-01-01 (×5): 300 mg via INTRAVENOUS
  Filled 2020-12-31 (×7): qty 3

## 2020-12-31 MED ORDER — AMIODARONE HCL IN DEXTROSE 360-4.14 MG/200ML-% IV SOLN
INTRAVENOUS | Status: AC
  Filled 2020-12-31: qty 200

## 2020-12-31 MED ORDER — HEPARIN (PORCINE) 25000 UT/250ML-% IV SOLN
2100.0000 [IU]/h | INTRAVENOUS | Status: DC
  Administered 2020-12-31: 1600 [IU]/h via INTRAVENOUS
  Administered 2021-01-01: 2000 [IU]/h via INTRAVENOUS
  Administered 2021-01-01: 2200 [IU]/h via INTRAVENOUS
  Administered 2021-01-02 – 2021-01-03 (×2): 2100 [IU]/h via INTRAVENOUS
  Filled 2020-12-31 (×6): qty 250

## 2020-12-31 MED ORDER — CHLORHEXIDINE GLUCONATE CLOTH 2 % EX PADS
6.0000 | MEDICATED_PAD | Freq: Every day | CUTANEOUS | Status: DC
  Administered 2020-12-31 – 2021-01-07 (×8): 6 via TOPICAL

## 2020-12-31 MED ORDER — METOPROLOL TARTRATE 5 MG/5ML IV SOLN
5.0000 mg | INTRAVENOUS | Status: DC | PRN
Start: 2020-12-31 — End: 2021-01-08
  Administered 2020-12-31: 5 mg via INTRAVENOUS
  Filled 2020-12-31 (×2): qty 5

## 2020-12-31 MED ORDER — AMIODARONE HCL IN DEXTROSE 360-4.14 MG/200ML-% IV SOLN
30.0000 mg/h | INTRAVENOUS | Status: DC
Start: 2020-12-31 — End: 2021-01-01
  Administered 2020-12-31 – 2021-01-01 (×3): 30 mg/h via INTRAVENOUS
  Filled 2020-12-31 (×2): qty 200

## 2020-12-31 NOTE — Consult Note (Addendum)
Cardiology Consultation:   Patient ID: Matthew Gutierrez MRN: [id number]; DOB: 12/13/1955  Admit date: 12/28/2020 Date of Consult: 12/31/2020  PCP:  Matthew Gutierrez, [facility] Cardiologist:  Matthew Campus, MD   {   Patient Profile:   Matthew Gutierrez is a 64 y.o. male with a complex PMH of HTN, HLD, type 2 DM, epilepsy, CVA, COPD, paroxysmal A fib on coumadin, CKD III, OSA on CPAP, PVD, COPD, hx of DVT, chronic lymphedema, and obesity, who is currently admitted for acute cholecystitis/choledocholithiasis/cholangitis,  cardiology is consulted on 12/31/2020 for the evaluation of A fib RVR at the request of Dr. Reesa Gutierrez.   History of Present Illness:   Matthew Gutierrez was originally diagnosed with paroxysmal atrial fibrillation on 12/14/2019 during hospitalization for cellulitis.  At the time he was already chronic anticoagulation with Coumadin for history of DVT.  Echocardiogram from 12/15/2019 with EF 60 to 65%, limited assessment on  wall motion abnormality and diastolic function, moderate pericardial effusion, no significant valvular disease.  He had a minimal elevation of troponin, that was felt due to demand ischemia.  He initially converted with IV diltiazem but developed resultant bradycardia, therefore transition to amiodarone for rate and rhythm control.  He was noted having QT prolongation.  He was also treated for acute diastolic heart failure with IV Lasix and metolazone and eventually transition to p.o. torsemide 60 mg daily, hydralazine and isosorbide at the time of discharge.  He followed up with atrial fibrillation clinic on 01/07/2020, was doing well and remained in sinus rhythm, continued on amiodarone 200 mg daily.  He saw Matthew Gutierrez on 01/17/2020 to establish care, was continued on amiodarone and Coumadin for atrial fibrillation.  He was lost to follow-up since with cardiology.   Amiodarone was discontinued at unknown time, PCP notes from 02/2020 indicates he was on  coreg and Cardizem.   There is not previous ischemic evaluation found.   Patient was sent in from [facility] on 12/28/2020 for evaluation of hypoxia with pulse ox 84% on room air, family reported 1 week onset of confusion.  He was found tachycardic, tachypneic, febrile at ER.  Diagnostic revealed leukocytosis with WBC 20400, CMP showed hyponatremia 131, elevated creatinine 1.92 with GFR 38, transaminitis with alkaline phos 267/AST 54/ALT 24, elevated total bilirubin 5.2, as well as albumin 2.  Lactic acid was elevated 2.2>2.5. Ammonia/TSH WNL. Flu and COVID-19 negative.  Hepatitis panel negative.  CTAP with contrast revealed acute cholecystitis.  Patient was subsequently admitted to hospital medicine service for sepsis due to acute cholecystitis.   During his course, he was started on empiric IV antibiotic.  Initial blood culture shortly turn positive for Klebsiella Pneumoniae and Staphylococcus cohnii.  Surgery and intervention consulted, cholecystectomy tube drain placed on by IR on 12/29/2020.  MRCP from 12/29/2020 confirmed acute cholecystitis and a 4 mm calculus in distal CBD.  GI was consulted and patient underwent ERCP on 12/30/2020, confirmed choledocholithiasis, stone and sludge removal was accomplished by biliary sphincterectomy and balloon extraction, epinephrine injection was given at the site of sphincterectomy to control bleeding.  Patient was advised okay to resume IV heparin 6 hours after procedure and resume Coumadin 5 days after procedure.  12/31/2020 early morning patient developed acute hypoxia with A. fib RVR, lethargic mentation, prompting nursing staff calling RRT.  He was reportedly had a heart rate of 160s with borderline low BP of SBP 80-90s, pulse ox was down to 70%.  ABG revealed hypoxemia and hypercarbia.  Chest  x-ray revealed complete opacification of right imaged thorax with slight mediastinum shift to the right. Patient was placed on AVAPS mode on BiPAP support and amiodarone  drip and transferred to ICU under PCCM.  It was felt complete right lung atelectasis or due to mucous plug.  Cardiology is asked to see the patient for A. fib RVR.   During encounter, he is alert, opened eyes to voice, on BIPAP support. He nodded vertically when asked if he has any chest pain or dizziness, did not respond to question on whether if he has any heart palpitation. He was not able to follow commands. He appeared confused. ICU nurse is at bedside, reports patient was alert and oriented to self, has not weaned of BIPAP support. He did require IV metoprolol dosing, with SBP down to 100s and HR improved from 140s to 120s. He is not able to offer more history due to encephalopathy at this time.     Past Medical History:  Diagnosis Date   Absence epileptic syndrome, not intractable, without status epilepticus ([place]) 07/23/2019   Acute renal failure superimposed on stage 3 chronic kidney disease ([place]) 07/23/2019   Atherosclerotic heart disease 07/23/2019   Atrial fibrillation with RVR ([place]) 12/14/2019   Benign enlargement of prostate 07/23/2019   Body mass index 50.0-59.9, adult ([place]) 07/23/2019   Cellulitis of leg 12/14/2019   Chronic respiratory failure with hypoxia ([place]) 07/23/2019   Chronic venous hypertension (idiopathic) with ulcer of left lower extremity ([place]) 07/23/2019   COPD (chronic obstructive pulmonary disease) ([place]) 07/23/2019   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   Diabetic neuropathy ([place]) 07/23/2019   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[place]) 10/15/2019   Epilepsy, grand mal ([place])    Essential hypertension 01/22/2018   Frozen shoulder    Right Shoulder   Gout attack 10/06/2019   H/O acute myocardial infarction 01/22/2018   H/O: stroke 01/22/2018   History of DVT (deep vein thrombosis) 01/22/2018   Incontinence without sensory awareness 07/23/2019   Long term (current) use of anticoagulants 07/23/2019   Lumbago    Lymphedema of both lower extremities 01/22/2018   Mixed hyperlipidemia 07/23/2019    Morbid obesity ([place]) 07/23/2019   Obstructive sleep apnea 07/23/2019   Paroxysmal atrial fibrillation ([place]) 01/07/2020   Peripheral vascular disease ([place])    Peripheral vascular disease, unspecified ([place]) 07/12/2011   Secondary hypercoagulable state ([place]) 01/07/2020   Shoulder bursitis 07/25/2019   Stroke ([place])    Urinary incontinence 07/25/2019    Past Surgical History:  Procedure Laterality Date   CORONARY STENT PLACEMENT     ERCP N/A 12/30/2020   Procedure: ENDOSCOPIC RETROGRADE CHOLANGIOPANCREATOGRAPHY (ERCP);  Surgeon: Ladene Artist, MD;  Location: [facility] ENDOSCOPY;  Service: Endoscopy;  Laterality: N/A;   IR PERC CHOLECYSTOSTOMY  12/29/2020   REMOVAL OF STONES  12/30/2020   Procedure: REMOVAL OF STONES;  Surgeon: Ladene Artist, MD;  Location: [facility];  Service: Endoscopy;;   SPHINCTEROTOMY  12/30/2020   Procedure: Joan Mayans;  Surgeon: Ladene Artist, MD;  Location: [facility] ENDOSCOPY;  Service: Endoscopy;;     Home Medications:  Prior to Admission medications   Medication Sig Start Date End Date Taking? Authorizing Provider  albuterol (PROVENTIL) (2.5 MG/3ML) 0.083% nebulizer solution Inhale 3 mLs (2.5 mg total) into the lungs every 6 (six) hours as needed for wheezing or shortness of breath. 03/18/20  Yes Matthew Anes, MD  aspirin 325 MG tablet Take 325 mg by mouth daily.   Yes [provider]  atorvastatin (LIPITOR) 10 MG tablet  Take 10 mg by mouth daily.   Yes [provider]  Cholecalciferol (VITAMIN D3) 1.25 MG (50000 UT) TABS Take 50,000 Units by mouth 2 (two) times a week. Every Wed and Sat   Yes [provider]  divalproex (DEPAKOTE) 125 MG DR tablet Take 125 mg by mouth 2 (two) times daily.   Yes [provider]  divalproex (DEPAKOTE) 500 MG DR tablet Take 500 mg by mouth 2 (two) times daily.   Yes [provider]  Dulaglutide (TRULICITY) [id number] SOPN Inject 0.75 mg into the skin every Tuesday.   Yes [provider]  DULoxetine (CYMBALTA) 60 MG capsule Take 60 mg by mouth daily.   Yes [provider]  EPINEPHrine 0.3 mg/0.3 mL IJ SOAJ injection Inject 0.3 mg into the muscle as needed for anaphylaxis. 03/18/20  Yes Matthew Anes, MD  fluticasone [facility]) 50 MCG/ACT nasal spray Place 1 spray into both nostrils daily. Patient taking differently: Place 1 spray into both nostrils daily as needed for allergies. 10/30/19  Yes Matthew Anes, MD  gabapentin (NEURONTIN) 300 MG capsule Take 1 capsule (300 mg total) by mouth at bedtime. 11/18/19  Yes Matthew Anes, MD  glucose blood test strip 1 each by Other route as needed. Use as instructed 03/17/20  Yes Matthew Anes, MD  isosorbide mononitrate (IMDUR) 120 MG 24 hr tablet Take 1 tablet (120 mg total) by mouth daily. 01/16/20  Yes Matthew Anes, MD  metoprolol succinate (TOPROL-XL) 25 MG 24 hr tablet Take 25 mg by mouth daily.   Yes [provider]  polyethylene glycol powder (GLYCOLAX/MIRALAX) 17 GM/SCOOP powder Take 1 Container by mouth every morning.   Yes [provider]  potassium chloride (KLOR-CON) 10 MEQ tablet Take 10 mEq by mouth daily.   Yes [provider]  risperiDONE (RISPERDAL) 1 MG tablet Take 1 mg by mouth 2 (two) times daily.   Yes [provider]  torsemide (DEMADEX) 20 MG tablet Take 20 mg by mouth daily.   Yes [provider]  allopurinol (ZYLOPRIM) 300 MG tablet Take 1 tablet (300 mg total) by mouth daily. Patient not taking: No sig reported 03/18/20   Matthew Anes, MD  carvedilol (COREG) 3.125 MG tablet Take 1 tablet (3.125 mg total) by mouth 2 (two) times daily with a meal. Patient not taking: No sig reported 02/05/20   Matthew Anes, MD  diltiazem (CARDIZEM) 60 MG tablet Take 1 tablet (60 mg total) by mouth 2 (two) times daily. Patient not taking: No sig reported 02/05/20   Matthew Anes, MD  metFORMIN  (GLUCOPHAGE) 1000 MG tablet Take 1,000 mg by mouth 2 (two) times daily with a meal. Patient not taking: No sig reported    [provider]  phenytoin (DILANTIN) 300 MG ER capsule Take 1 capsule (300 mg total) by mouth 2 (two) times daily. Patient currently on 100 mg 2 times a day Patient not taking: No sig reported 01/16/20   Matthew Anes, MD  pravastatin (PRAVACHOL) 40 MG tablet Take 1 tablet (40 mg total) by mouth daily. Patient not taking: No sig reported 11/18/19   Matthew Anes, MD  TOVIAZ 4 MG TB24 tablet TAKE 1 TABLET EVERY DAY Patient not taking: No sig reported 03/25/20   Matthew Anes, MD  triamcinolone cream (KENALOG) 0.1 % APPLY TO THE AFFECTED AREA(S) TWICE DAILY AS NEEDED Patient not taking: No sig reported 11/16/19   Matthew Anes, MD  warfarin (COUMADIN) 2 MG tablet Take 1 tablet (2 mg total) by mouth 3 (three) times a week. Monday, Wednesday and Friday Patient not taking: No sig reported 03/18/20   Matthew Anes, MD  warfarin (COUMADIN) 4 MG tablet Take 4 mg by mouth daily. Saturday, Sunday,tuesday and thursday Patient not taking: No sig reported    [provider]    Inpatient Medications: Scheduled Meds:  Chlorhexidine Gluconate Cloth  6 each Topical Daily   collagenase   Topical Daily   gabapentin  300 mg Oral QHS   metoprolol tartrate  12.5 mg Oral Q6H   sodium chloride flush  5 mL Intracatheter Q12H   Continuous Infusions:  amiodarone 30 mg/hr (12/31/20 1304)   cefTRIAXone (ROCEPHIN)  IV 2 g (12/31/20 1239)   heparin     lactated ringers 75 mL/hr at 12/30/20 2200   valproate sodium 625 mg (12/31/20 0815)   PRN Meds: acetaminophen **OR** acetaminophen, albuterol, hydrALAZINE, HYDROmorphone (DILAUDID) injection, insulin aspart, metoprolol tartrate, ondansetron **OR** ondansetron (ZOFRAN) IV, traZODone  Allergies:    Allergies  Allergen Reactions   Codeine Other (See Comments)    Unknown Reaction     Social History:   Social History   Socioeconomic History   Marital status: Divorced    Spouse name: Not on file   Number of children: 2   Years of education: Not on file   Highest education level: Not on file  Occupational History   Occupation: Disability  Tobacco Use   Smoking status: Former    Types: Cigarettes    Quit date: 10/28/2009    Years since quitting: 11.1   Smokeless tobacco: Never  Substance and Sexual Activity   Alcohol use: Not Currently    Comment: occassionally   Drug use: No   Sexual activity: Not Currently  Other Topics Concern   Not on file  Social History Narrative   Not on file   Social Determinants of Health   Financial Resource Strain: Not on file  Food Insecurity: Not on file  Transportation Needs: Not on file  Physical Activity: Not on file  Stress: Not on file  Social Connections: Not on file  Intimate Partner Violence: Not on file    Family History:    Family History  Problem Relation Age of Onset   Cancer Mother    Stroke Father    Hypertension Other    Stroke Other    Hyperlipidemia Other    Alcohol abuse Other    Heart disease Other    Cancer Other      ROS:  Unable to perform ROS as patient is dependent on BIPAP support and confused at this time    Physical Exam/Data:   Vitals:   12/31/20 1230 12/31/20 1234 12/31/20 1245 12/31/20 1300  BP:  (!) 132/112  106/71  Pulse: 64  (!) 103 98  Resp: 17  (!) 25 (!) 25  Temp:      TempSrc:      SpO2: 97%  95% 92%  Weight:      Height:        Intake/Output Summary (Last 24 hours) at 12/31/2020 1441 Last data filed at 12/31/2020 0806 Gross per 24 hour  Intake 505 ml  Output 1345 ml  Net -840 ml   Last 3 Weights 12/30/2020 12/28/2020 03/12/2020  Weight (lbs) 289 lb 14.5 oz 287 lb 14.7 oz 288 lb  Weight (kg) 131.5 kg 130.6 kg 130.636 kg     Body mass index is  39.32 kg/m.   Vitals:  Vitals:   12/31/20 1245 12/31/20 1300  BP:  106/71  Pulse: (!) 103 98  Resp: (!) 25 (!)  25  Temp:    SpO2: 95% 92%   General Appearance: In no apparent distress, laying in bed, chronically ill appearing  HEENT: Normocephalic, atraumatic. Eyes tract with movement.  Neck: Supple, trachea midline, no significant JVD  Cardiovascular:Irregularly irregular,  normal S1-S2,  no murmur/rub/gallop Respiratory: Resting breathing unlabored, on BIPAP support, lungs sounds are diminished bilaterally Gastrointestinal: Obese, soft, BS+, C tube drain in place RUQ Extremities:  chronic edema of venous stasis change of BLE Musculoskeletal: Normal muscle bulk and tone, global weakness Skin: DRY and peeling skin noted throughout  Neurologic: Alert, responded to his name, nodded vertically when asked if he has chest pain, unable carry conversation with BIPAP support, appears confused, not follow commands Psychiatric: Calm   EKG:  The EKG was personally reviewed and demonstrates:  A fib RVR with ventricular rate of 141 bpm, LAD, iRBBB, rate associate ST-T abnormalities, artifacts   Telemetry:  Telemetry was personally reviewed and demonstrates:  A fib with RVR with ventricular rate of 120-140s   Relevant CV Studies:  Echo from 12/15/2019:   1. Poor acoustic windows limit study Left ventricular ejection fraction,  by estimation, is 60 to 65%. The left ventricle has normal function. Left  ventricular endocardial border not optimally defined to evaluate regional  wall motion. The left  ventricular internal cavity size was mildly dilated. Left ventricular  diastolic function could not be evaluated.   2. Right ventricular systolic function is grossly normal. The right  ventricular size is normal.   3. Pericardial effusion is mainly posterior to LV 14 mm maximal  dimenstion. No evid of filling compromise. . Moderate pericardial  effusion.   4. The mitral valve is normal in structure. No evidence of mitral valve  regurgitation.   5. The aortic valve is tricuspid. Aortic valve regurgitation is not   visualized. Mild aortic valve sclerosis is present, with no evidence of  aortic valve stenosis.   6. The inferior vena cava is dilated in size with >50% respiratory  variability, suggesting right atrial pressure of 8 mmHg.   Laboratory Data:  High Sensitivity Troponin:  No results for input(s): TROPONINIHS in the last 720 hours.   Chemistry Recent Labs  Lab 12/29/20 0340 12/30/20 0032 12/31/20 0641  NA 135 138 138  K 3.5 3.4* 4.1  CL 94* 96* 99  CO2 '30 29 26  '$ GLUCOSE 207* 181* 238*  BUN 28* 28* 35*  CREATININE 1.77* 1.71* 2.13*  CALCIUM 8.8* 9.1 8.8*  GFRNONAA 42* 44* 34*  ANIONGAP '11 13 13    '$ Recent Labs  Lab 12/29/20 0340 12/30/20 0032 12/31/20 0641  PROT 6.7 6.7 6.2*  ALBUMIN 1.7* 1.8* 1.5*  AST 48* 54* 67*  ALT '23 26 28  '$ ALKPHOS 244* 200* 212*  BILITOT 4.6* 4.4* 4.7*   Hematology Recent Labs  Lab 12/29/20 0340 12/30/20 0032 12/31/20 0641  WBC 16.7* 9.3 13.1*  RBC 4.13* 4.15* 3.73*  HGB 11.3* 11.3* 9.9*  HCT 35.5* 36.2* 33.9*  MCV 86.0 87.2 90.9  MCH 27.4 27.2 26.5  MCHC 31.8 31.2 29.2*  RDW 15.9* 16.2* 16.6*  PLT 165 142* 142*   BNPNo results for input(s): BNP, PROBNP in the last 168 hours.  DDimer No results for input(s): DDIMER in the last 168 hours.   Radiology/Studies:  CT HEAD WO CONTRAST (5MM)  Result Date: 12/28/2020  CLINICAL DATA:  Mental status change, unknown cause altered mental status. EXAM: CT HEAD WITHOUT CONTRAST TECHNIQUE: Contiguous axial images were obtained from the base of the skull through the vertex without intravenous contrast. COMPARISON:  None. FINDINGS: Brain: Normal anatomic configuration. There is moderate parenchymal volume loss, slightly advanced given the patient's age. Remote lacunar infarcts are noted within the left caudate nucleus, left insular cortex, and right thalamus. Periventricular white matter changes are present likely reflecting the sequela of small vessel ischemia. No abnormal intra or extra-axial mass  lesion or fluid collection. No abnormal mass effect or midline shift. No evidence of acute intracranial hemorrhage or infarct. Ventricular size is normal. Cerebellum unremarkable. Vascular: No asymmetric hyperdense vasculature at the skull base. Skull: Intact Sinuses/Orbits: Paranasal sinuses are clear. Orbits are unremarkable. Other: Mastoid air cells and middle ear cavities are clear. IMPRESSION: No acute intracranial abnormality. Advanced senescent change. Multiple remote infarcts as noted above. Electronically Signed   By: Fidela Salisbury MD   On: 12/28/2020 23:45   CT ABDOMEN PELVIS W CONTRAST  Result Date: 12/28/2020 CLINICAL DATA:  Abdominal pain, biliary obstruction suspected (Ped 0-18y) EXAM: CT ABDOMEN AND PELVIS WITH CONTRAST TECHNIQUE: Multidetector CT imaging of the abdomen and pelvis was performed using the standard protocol following bolus administration of intravenous contrast. CONTRAST:  158m OMNIPAQUE IOHEXOL 300 MG/ML  SOLN COMPARISON:  04/11/2020 FINDINGS: Lower chest: Small right pleural effusion noted at the visualized right lung base with right basilar atelectasis. Mild left basilar atelectasis. Extensive multi-vessel coronary artery calcification with probable stenting of the left anterior descending coronary artery. Cardiac size is mildly enlarged. No pericardial effusion. Hepatobiliary: The gallbladder is distended, the gallbladder wall is thickened, and there is extensive pericholecystic inflammatory change in keeping with changes of acute cholecystitis. The extrahepatic bile duct is not dilated. There is no intrahepatic biliary ductal dilation. The liver is unremarkable. Pancreas: Unremarkable Spleen: Unremarkable Adrenals/Urinary Tract: Adrenal glands are unremarkable. Kidneys are normal, without renal calculi, focal lesion, or hydronephrosis. Bladder is unremarkable. Stomach/Bowel: The stomach, small bowel, and large bowel are unremarkable. The appendix is not visualized and is  likely absent. No free intraperitoneal gas or fluid. Vascular/Lymphatic: Mild aortoiliac atherosclerotic calcification. Advanced atherosclerotic calcification is seen within the visualized lower extremity arterial outflow. No aortic aneurysm. No pathologic adenopathy within the abdomen and pelvis. Reproductive: Prostate is unremarkable. Other: Tiny fat containing umbilical hernia.  Rectum unremarkable. Musculoskeletal: No acute bone abnormality. No lytic or blastic bone lesions are seen. IMPRESSION: Acute cholecystitis.  Surgical consultation is advised. Extensive coronary artery calcification.  Mild global cardiomegaly. Aortic Atherosclerosis (ICD10-I70.0). Electronically Signed   By: AFidela SalisburyMD   On: 12/28/2020 23:50   MR ABDOMEN MRCP WO CONTRAST  Result Date: 12/29/2020 CLINICAL DATA:  Biliary obstruction, choledocholithiasis, altered mental status, acute cholecystitis, status post percutaneous cholecystostomy EXAM: MRI ABDOMEN WITHOUT CONTRAST  (INCLUDING MRCP) TECHNIQUE: Multiplanar multisequence MR imaging of the abdomen was performed. Heavily T2-weighted images of the biliary and pancreatic ducts were obtained, and three-dimensional MRCP images were rendered by post processing. COMPARISON:  CT abdomen pelvis, 12/28/2020, percutaneous cholecystostomy, 12/29/2020 FINDINGS: Lower chest: Small bilateral pleural effusions and associated atelectasis or consolidation. Cardiomegaly. Hepatobiliary: Mild hepatic steatosis. No mass or other parenchymal abnormality identified. The gallbladder is distended, with wall thickening and pericholecystic fluid, containing numerous tiny gallstones. Percutaneous cholecystostomy tube is in place, with formed pigtail in the gallbladder fundus. There is a small calculus in the distal common bile duct measuring 4 mm (series 8, image 27). No  biliary ductal dilatation. Mild periportal edema. Pancreas: No mass, inflammatory changes, or other parenchymal abnormality identified.  Spleen:  Splenomegaly, maximum coronal span 15.5 cm. Adrenals/Urinary Tract: No masses identified. No evidence of hydronephrosis. Stomach/Bowel: Visualized portions within the abdomen are unremarkable. Vascular/Lymphatic: No pathologically enlarged lymph nodes identified. No abdominal aortic aneurysm demonstrated. Other:  Small volume perihepatic ascites. Musculoskeletal: No suspicious bone lesions identified. IMPRESSION: 1. Percutaneous cholecystostomy tube is in place, with formed pigtail in the gallbladder fundus. 2. The gallbladder is distended, with wall thickening and pericholecystic fluid, containing numerous tiny gallstones. Findings are consistent with acute cholecystitis. 3. There is a 4 mm calculus in the distal common bile duct within the pancreatic head. No biliary ductal dilatation. 4. Mild hepatic steatosis. 5. Splenomegaly. 6. Small volume perihepatic ascites. 7. Small bilateral pleural effusions and associated atelectasis or consolidation. 8. Cardiomegaly. Electronically Signed   By: Eddie Candle M.D.   On: 12/29/2020 13:40   IR Perc Cholecystostomy  Result Date: 12/29/2020 INDICATION: 65 year old male with acute cholecystitis EXAM: ULTRASOUND AND FLUOROSCOPIC-GUIDED CHOLECYSTOSTOMY TUBE PLACEMENT COMPARISON:  CT Abdomen Pelvis, 12/28/2020. MEDICATIONS: The patient is currently admitted to the hospital and on intravenous antibiotics. Antibiotics were administered within an appropriate time frame prior to skin puncture. ANESTHESIA/SEDATION: Moderate (conscious) sedation was employed during this procedure. A total of Versed 1.5 mg and Fentanyl 75 mcg was administered intravenously. Moderate Sedation Time: 26 minutes. The patient's level of consciousness and vital signs were monitored continuously by radiology nursing throughout the procedure under my direct supervision. CONTRAST:  31m OMNIPAQUE IOHEXOL 300 MG/ML SOLN - administered into the gallbladder fossa. FLUOROSCOPY TIME:  1 minutes 0 seconds  (9 mGy) COMPLICATIONS: None immediate. PROCEDURE: Informed written consent was obtained from the patient and/or patient's representative after a discussion of the risks, benefits and alternatives to treatment. Questions regarding the procedure were encouraged and answered. A timeout was performed prior to the initiation of the procedure. The right upper abdominal quadrant was prepped and draped in the usual sterile fashion, and a sterile drape was applied covering the operative field. Maximum barrier sterile technique with sterile gowns and gloves were used for the procedure. A timeout was performed prior to the initiation of the procedure. Local anesthesia was provided with 1% lidocaine with epinephrine. Ultrasound scanning of the right upper quadrant demonstrates a markedly dilated gallbladder. Utilizing a transhepatic approach, a 22 gauge needle was advanced into the gallbladder under direct ultrasound guidance. An ultrasound image was saved for documentation purposes. Appropriate intraluminal puncture was confirmed with the efflux of bile and advancement of an 0.018 wire into the gallbladder lumen. The needle was exchanged for an [place]. A small amount of contrast was injected to confirm appropriate intraluminal positioning. Over a [name] wire, a 1552-French Cook cholecystomy tube was advanced into the gallbladder fossa, coiled and locked. Bile was aspirated and a small amount of contrast was injected as several post procedural spot radiographic images were obtained in various obliquities. The catheter was secured to the skin with suture, connected to a drainage bag and a dressing was placed. The patient tolerated the procedure well without immediate post procedural complication. IMPRESSION: Successful placement of a 10.2 French cholecystostomy tube, as above. JMichaelle Birks MD [facility] Electronically Signed   By: JMichaelle BirksMD   On: 12/29/2020  16:50   DG CHEST PORT 1 VIEW  Result Date: 12/31/2020 CLINICAL DATA:  Hypoxia. EXAM: PORTABLE CHEST 1 VIEW COMPARISON:  12/31/2020. FINDINGS: Cardiomegaly. No pulmonary venous  congestion. Interim decrease opacification right hemithorax with interim visualization of the right upper lung. Findings consistent with partial resolution of large right pleural effusion. Large right pleural effusion remains. Underlying pulmonary disease cannot be excluded. No pneumothorax. IMPRESSION: 1. Findings consistent with interim partial resolution of large right pleural effusion. Large right pleural effusion remains. Underlying right lung disease cannot be excluded. 2.  Cardiomegaly.  No pulmonary venous congestion. Electronically Signed   By: Marcello Moores  Register   On: 12/31/2020 06:07   DG Chest Port 1 View  Result Date: 12/31/2020 CLINICAL DATA:  Acute respiratory distress. EXAM: PORTABLE CHEST 1 VIEW COMPARISON:  Chest radiograph dated 12/29/2020. FINDINGS: There is complete opacification of the right imaged thorax with slight shift of the mediastinum to the right of the midline suggestive of volume loss. This is new since the prior radiograph and may represent atelectasis, infiltrate, or combination of pleural effusion and associated atelectasis/infiltrate. Occlusion of the central airway is not excluded. Linear density at the left lung base as seen on the prior radiograph. No pneumothorax. No acute osseous pathology. IMPRESSION: Complete opacification of the right imaged thorax with slight shift of the mediastinum to the right of the midline suggestive of volume loss. Clinical correlation recommended. These results were called by telephone at the time of interpretation on 12/31/2020 at 2:10 am to nurse Riley Churches , who verbally acknowledged these results. Electronically Signed   By: Anner Crete M.D.   On: 12/31/2020 02:18   DG CHEST PORT 1 VIEW  Result Date: 12/29/2020 CLINICAL DATA:  Hypoxia EXAM: PORTABLE CHEST 1  VIEW COMPARISON:  Chest x-ray 12/28/2020, CT abdomen pelvis 12/28/2020 FINDINGS: Enlarged cardiac silhouette. The heart size and mediastinal contours are unchanged. Low lung volumes with bibasilar streaky airspace opacities likely representing atelectasis. Elevated right hemidiaphragm no focal consolidation. No pulmonary edema. Trace right pleural effusion. No left pleural effusion. No pneumothorax. No acute osseous abnormality. IMPRESSION: 1. Trace right pleural effusion. 2. Low lung volumes. 3. Elevated right hemidiaphragm. Electronically Signed   By: Iven Finn M.D.   On: 12/29/2020 03:59   DG Chest Portable 1 View  Result Date: 12/28/2020 CLINICAL DATA:  Tachycardia. EXAM: PORTABLE CHEST 1 VIEW COMPARISON:  04/11/2020 FINDINGS: Low lung volumes persist. Stable heart size, likely accentuated by technique and low lung volumes. Unchanged mediastinal contours. Streaky bibasilar atelectasis or scarring. No pulmonary edema, pleural effusion or pneumothorax. Patient's chin partially obscures the right lung apex. No acute osseous abnormalities are seen. IMPRESSION: Low lung volumes with streaky bibasilar atelectasis or scarring. Electronically Signed   By: Keith Rake M.D.   On: 12/28/2020 22:58   DG ERCP  Result Date: 12/31/2020 CLINICAL DATA:  64 year old male undergoing ERCP for choledocholithiasis EXAM: ERCP TECHNIQUE: Multiple spot images obtained with the fluoroscopic device and submitted for interpretation post-procedure. FLUOROSCOPY TIME:  Fluoroscopy Time:  3 minutes 55 seconds Radiation Exposure Index (if provided by the fluoroscopic device): 182.4 Number of Acquired Spot Images: 0 COMPARISON:  MRCP 12/29/2020 FINDINGS: A total of 14 intraoperative saved images are submitted for review. The images demonstrate a flexible duodenal scope in the descending duodenum. A percutaneous cholecystostomy tube is present. Subsequent images demonstrate cannulation of the common bile duct. Cholangiogram  demonstrates a small filling defect in the distal common bile duct consistent with choledocholithiasis. The filling defect is no longer present on the final images consistent with removal. IMPRESSION: 1. Choledocholithiasis. 2. Percutaneous cholecystostomy tube in place. 3. ERCP as above. These images were submitted for radiologic interpretation only. Please  see the procedural report for the amount of contrast and the fluoroscopy time utilized. Electronically Signed   By: Jacqulynn Cadet M.D.   On: 12/31/2020 08:41   US Abdomen Limited RUQ (LIVER/GB)  Result Date: 12/28/2020 CLINICAL DATA:  Abdominal pain. EXAM: ULTRASOUND ABDOMEN LIMITED RIGHT UPPER QUADRANT COMPARISON:  CT abdomen pelvis 04/11/2020 FINDINGS: Gallbladder: Abnormal gallbladder markedly limited evaluation - question of a heterogeneous structure distending the gallbladder. Common bile duct: Diameter: 4 mm Liver: Markedly limited evaluation. Unable to evaluate for focal hepatic lesion. Normal parenchymal echogenicity. Portal vein is patent on color Doppler imaging with normal direction of blood flow towards the liver. Other: None. IMPRESSION: 1. Abnormal gallbladder with markedly limited/nondiagnostic evaluation - question of a heterogeneous structure distending the gallbladder. Recommend CT abdomen pelvis with intravenous contrast for further evaluation. 2. Markedly limited evaluation of the liver due to decreased penetration of the sonographic waves. Electronically Signed   By: Iven Finn M.D.   On: 12/28/2020 22:45     Assessment and Plan:   Paroxysmal atrial fibrillation with RVR -Initially occurred in 11/2019 during hospitalization for cellulitis; historically controlled well on amiodarone -Reoccurred in the setting of severe sepsis due to cholecystitis/choledocholithiasis/cholangitis, hypoxic respiratory failure, and recent ERCP 12/30/20 -Recommend optimize volume status avoid dehydration, continue aggressive treatment for  infection/sepsis/hypoxia/anemia  -Replete electrolytes to keep K >4 and Mag > 2 -CHA2DS2-VASc score is 7, highly recommend resume anticoagulation with IV heparin if there is no contraindication, may resume Coumadin clinically stable (5 days after ERCP  per GI) - Rate control is overall improved, already received total IV 300 mg bolus and remains drip  - will add PO PRN metoprolol 12.'5mg'$  q6h for rate control, holding parameter for BP added , OK to use IV metoprolol PRN until PO intake is allowed   Chronic diastolic heart failure -Echocardiogram from 12/15/2019 with EF 60 to 65%, limited evaluation of diastolic function as well as regional wall motion abnormality, and moderate pericardial effusion, no significant valvular disease -Chest x-ray revealed large right pleural effusion/mucus  - will check BNP, repeat echocardiogram today  - UOP 923m over the past 24 hours, Net + 3.6 L since admission  - Clinically  does not appear volume overload  - Home medication torsemide is held currently, appropriate in the setting of dehydration/sepsis   Hypertension -Blood pressure is low normal, home medicine metoprolol, Cardizem, and torsemide currently held, will start short acting metoprolol for A. fib rate control with holding parameter  HLD  - on Lipitor 10 mg daily at home, currently held due to transaminitis  PVD - home meds ASA and statin held, may resume if stable and improving   Acute hypoxic hypercarbic respiratory failure Severe sepsis due to cholecystitis, cholangitis, choledocholithiasis/bacteremia Toxic metabolic encephalopathy AKI on CKD stage III Type 2 diabetes with hyperglycemia Acute on chronic anemia OSA Epilepsy History of DVT Hx of CVA -Prognosis is guarded, palliative care consult is pending, managed per PCCM and IM     Risk Assessment/Risk Scores:        New York Heart Association (NYHA) Functional Class NYHA Class I  CHA2DS2-VASc Score = 7  This indicates a  11.2% annual risk of stroke. The patient's score is based upon: CHF History: Yes HTN History: Yes Diabetes History: Yes Stroke History: Yes Vascular Disease History: Yes Age Score: 1 Gender Score: 0       For questions or updates, please contact CHanoverPlease consult [email or web address] for contact info under    Signed, XTonny Bollman  Maylon Peppers, NP  12/31/2020 2:41 PM  History and all data above reviewed.  Patient examined.  I agree with the findings as above.  He has no prior cardiac history of atrial fibrillation as above.   He needs.  He had numerous earlier this year was able to ambulate with walker.  However nursing home he has had progressive decline.  He unresponsive.  He is currently septic with acute cholecystitis.  More ERCP yesterday he developed respiratory failure earlier this morning.  Chest x-ray demonstrates right lung atelectasis with volume loss and mediastinal shift to the right.  He had previously had right effusion but this is markedly changed.  Started on BiPAP and transferred to the ICU his sister and his son at the beside report that even in his state of acute illness he is better currently than he was prior to admission.  He is unable to report any chest pain or shortness of breath.  He has had IV bolus heparin and is currently on a drip.  Rates are in the 120s.  Was the patient exam reveals COR: Irregular, no obvious murmurs ,  Lungs: Decreased breath sounds without crackles,  Abd: Positive bowel sounds, no rebound no guarding, Ext Mild diffuse edema  .  All available labs, radiology testing, previous records reviewed. Agree with documented assessment and plan. Atrial fib with rapid rate: I suspect this is secondary rather than a primary culprit but I agree with the IV bolus and infusion of amiodarone.  He has been restarted on heparin which was temporarily interrupted for his procedure.   He probably will tolerate continued low-dose IV metoprolol boluses as needed and we have  written for oral when he can take pills.   At this point I do not think there is indication for urgent cardioversion.   No other change in therapy at this point.  He needs continued supportive care of his sepsis and atelectasis with pleural effusion.  Jeneen Rinks Rayshaun Needle  2:59 PM  12/31/2020

## 2020-12-31 NOTE — Progress Notes (Signed)
OT Cancellation Note  Patient Details Name: Van Gamarra MRN: [id number] DOB: Sep 01, 1955   Cancelled Treatment:    Reason Eval/Treat Not Completed: Patient not medically ready Pt noted with acute oxygen desaturation and Afib with RVR overnight. Continued assessment of ABG, as well as possible transfer to ICU if unable to stabilize on progressive floor. Will hold OT eval for now and follow up as pt appropriate for therapy.   Layla Maw 12/31/2020, 6:59 AM

## 2020-12-31 NOTE — Progress Notes (Signed)
Referring Physician(s): Dr. Georganna Skeans  Supervising Physician: Mir, Sharen Heck  Patient Status:  [facility] - In-pt  Chief Complaint:  Follow up Perc Chole  Brief History:  Matthew Gutierrez is a 65 y.o. male who presented to the ER with altered mental status and fever.   His workup showed leukocytosis and elevated LFTs.   His imaging showed findings c/w acute cholecystitis.   Surgery team was consulted and given his overall status and underlying conditions, he was not felt to be a good surgical candidate.   He underwent percutaneous cholecystectomy by Dr. Maryelizabeth Kaufmann on 12/29/20.    Subjective:  Now on Bi-PAP. Asking to see his sister.  Allergies: Codeine  Medications: Prior to Admission medications   Medication Sig Start Date End Date Taking? Authorizing Provider  albuterol (PROVENTIL) (2.5 MG/3ML) 0.083% nebulizer solution Inhale 3 mLs (2.5 mg total) into the lungs every 6 (six) hours as needed for wheezing or shortness of breath. 03/18/20  Yes Lillard Anes, MD  aspirin 325 MG tablet Take 325 mg by mouth daily.   Yes [provider]  atorvastatin (LIPITOR) 10 MG tablet Take 10 mg by mouth daily.   Yes [provider]  Cholecalciferol (VITAMIN D3) 1.25 MG (50000 UT) TABS Take 50,000 Units by mouth 2 (two) times a week. Every Wed and Sat   Yes [provider]  divalproex (DEPAKOTE) 125 MG DR tablet Take 125 mg by mouth 2 (two) times daily.   Yes [provider]  divalproex (DEPAKOTE) 500 MG DR tablet Take 500 mg by mouth 2 (two) times daily.   Yes [provider]  Dulaglutide (TRULICITY) [id number] SOPN Inject 0.75 mg into the skin every Tuesday.   Yes [provider]  DULoxetine (CYMBALTA) 60 MG capsule Take 60 mg by mouth daily.   Yes [provider]  EPINEPHrine 0.3 mg/0.3 mL IJ SOAJ injection Inject 0.3 mg into the muscle as needed for anaphylaxis. 03/18/20  Yes Lillard Anes, MD  fluticasone  [facility]) 50 MCG/ACT nasal spray Place 1 spray into both nostrils daily. Patient taking differently: Place 1 spray into both nostrils daily as needed for allergies. 10/30/19  Yes Lillard Anes, MD  gabapentin (NEURONTIN) 300 MG capsule Take 1 capsule (300 mg total) by mouth at bedtime. 11/18/19  Yes Lillard Anes, MD  glucose blood test strip 1 each by Other route as needed. Use as instructed 03/17/20  Yes Lillard Anes, MD  isosorbide mononitrate (IMDUR) 120 MG 24 hr tablet Take 1 tablet (120 mg total) by mouth daily. 01/16/20  Yes Lillard Anes, MD  metoprolol succinate (TOPROL-XL) 25 MG 24 hr tablet Take 25 mg by mouth daily.   Yes [provider]  polyethylene glycol powder (GLYCOLAX/MIRALAX) 17 GM/SCOOP powder Take 1 Container by mouth every morning.   Yes [provider]  potassium chloride (KLOR-CON) 10 MEQ tablet Take 10 mEq by mouth daily.   Yes [provider]  risperiDONE (RISPERDAL) 1 MG tablet Take 1 mg by mouth 2 (two) times daily.   Yes [provider]  torsemide (DEMADEX) 20 MG tablet Take 20 mg by mouth daily.   Yes [provider]  allopurinol (ZYLOPRIM) 300 MG tablet Take 1 tablet (300 mg total) by mouth daily. Patient not taking: No sig reported 03/18/20   Lillard Anes, MD  carvedilol (COREG) 3.125 MG tablet Take 1 tablet (3.125 mg total) by mouth 2 (two) times daily with a meal. Patient not taking:  No sig reported 02/05/20   Lillard Anes, MD  diltiazem (CARDIZEM) 60 MG tablet Take 1 tablet (60 mg total) by mouth 2 (two) times daily. Patient not taking: No sig reported 02/05/20   Lillard Anes, MD  metFORMIN (GLUCOPHAGE) 1000 MG tablet Take 1,000 mg by mouth 2 (two) times daily with a meal. Patient not taking: No sig reported    [provider]  phenytoin (DILANTIN) 300 MG ER capsule Take 1 capsule (300 mg total) by mouth 2 (two) times daily. Patient currently on 100 mg  2 times a day Patient not taking: No sig reported 01/16/20   Lillard Anes, MD  pravastatin (PRAVACHOL) 40 MG tablet Take 1 tablet (40 mg total) by mouth daily. Patient not taking: No sig reported 11/18/19   Lillard Anes, MD  TOVIAZ 4 MG TB24 tablet TAKE 1 TABLET EVERY DAY Patient not taking: No sig reported 03/25/20   Lillard Anes, MD  triamcinolone cream (KENALOG) 0.1 % APPLY TO THE AFFECTED AREA(S) TWICE DAILY AS NEEDED Patient not taking: No sig reported 11/16/19   Lillard Anes, MD  warfarin (COUMADIN) 2 MG tablet Take 1 tablet (2 mg total) by mouth 3 (three) times a week. Monday, Wednesday and Friday Patient not taking: No sig reported 03/18/20   Lillard Anes, MD  warfarin (COUMADIN) 4 MG tablet Take 4 mg by mouth daily. Saturday, Sunday,tuesday and thursday Patient not taking: No sig reported    [provider]     Vital Signs: BP 113/66 (BP Location: Left Arm)   Pulse (!) 141   Temp 97.9 F (36.6 C) (Axillary) Comment: RED MEWS protocol  Resp (!) 26   Ht 6' (1.829 m)   Wt 131.5 kg   SpO2 100%   BMI 39.32 kg/m   Physical Exam Constitutional:      Appearance: He is ill-appearing.     Comments: On Bi-PAP  HENT:     Head: Normocephalic and atraumatic.  Cardiovascular:     Rate and Rhythm: Tachycardia present.  Pulmonary:     Comments: On Bi-PAP Abdominal:     Comments: Perc Chole in place Cloudy bilious drainage in gravity bag. 40 mL output recorded  Skin:    General: Skin is warm and dry.  Neurological:     General: No focal deficit present.     Mental Status: He is alert.  Psychiatric:        Mood and Affect: Mood normal.        Behavior: Behavior normal.    Imaging: CT HEAD WO CONTRAST (5MM)  Result Date: 12/28/2020 CLINICAL DATA:  Mental status change, unknown cause altered mental status. EXAM: CT HEAD WITHOUT CONTRAST TECHNIQUE: Contiguous axial images were obtained from the base of the skull through  the vertex without intravenous contrast. COMPARISON:  None. FINDINGS: Brain: Normal anatomic configuration. There is moderate parenchymal volume loss, slightly advanced given the patient's age. Remote lacunar infarcts are noted within the left caudate nucleus, left insular cortex, and right thalamus. Periventricular white matter changes are present likely reflecting the sequela of small vessel ischemia. No abnormal intra or extra-axial mass lesion or fluid collection. No abnormal mass effect or midline shift. No evidence of acute intracranial hemorrhage or infarct. Ventricular size is normal. Cerebellum unremarkable. Vascular: No asymmetric hyperdense vasculature at the skull base. Skull: Intact Sinuses/Orbits: Paranasal sinuses are clear. Orbits are unremarkable. Other: Mastoid air cells and middle ear cavities are clear. IMPRESSION: No acute intracranial abnormality. Advanced  senescent change. Multiple remote infarcts as noted above. Electronically Signed   By: Fidela Salisbury MD   On: 12/28/2020 23:45   CT ABDOMEN PELVIS W CONTRAST  Result Date: 12/28/2020 CLINICAL DATA:  Abdominal pain, biliary obstruction suspected (Ped 0-18y) EXAM: CT ABDOMEN AND PELVIS WITH CONTRAST TECHNIQUE: Multidetector CT imaging of the abdomen and pelvis was performed using the standard protocol following bolus administration of intravenous contrast. CONTRAST:  142m OMNIPAQUE IOHEXOL 300 MG/ML  SOLN COMPARISON:  04/11/2020 FINDINGS: Lower chest: Small right pleural effusion noted at the visualized right lung base with right basilar atelectasis. Mild left basilar atelectasis. Extensive multi-vessel coronary artery calcification with probable stenting of the left anterior descending coronary artery. Cardiac size is mildly enlarged. No pericardial effusion. Hepatobiliary: The gallbladder is distended, the gallbladder wall is thickened, and there is extensive pericholecystic inflammatory change in keeping with changes of acute  cholecystitis. The extrahepatic bile duct is not dilated. There is no intrahepatic biliary ductal dilation. The liver is unremarkable. Pancreas: Unremarkable Spleen: Unremarkable Adrenals/Urinary Tract: Adrenal glands are unremarkable. Kidneys are normal, without renal calculi, focal lesion, or hydronephrosis. Bladder is unremarkable. Stomach/Bowel: The stomach, small bowel, and large bowel are unremarkable. The appendix is not visualized and is likely absent. No free intraperitoneal gas or fluid. Vascular/Lymphatic: Mild aortoiliac atherosclerotic calcification. Advanced atherosclerotic calcification is seen within the visualized lower extremity arterial outflow. No aortic aneurysm. No pathologic adenopathy within the abdomen and pelvis. Reproductive: Prostate is unremarkable. Other: Tiny fat containing umbilical hernia.  Rectum unremarkable. Musculoskeletal: No acute bone abnormality. No lytic or blastic bone lesions are seen. IMPRESSION: Acute cholecystitis.  Surgical consultation is advised. Extensive coronary artery calcification.  Mild global cardiomegaly. Aortic Atherosclerosis (ICD10-I70.0). Electronically Signed   By: AFidela SalisburyMD   On: 12/28/2020 23:50   MR ABDOMEN MRCP WO CONTRAST  Result Date: 12/29/2020 CLINICAL DATA:  Biliary obstruction, choledocholithiasis, altered mental status, acute cholecystitis, status post percutaneous cholecystostomy EXAM: MRI ABDOMEN WITHOUT CONTRAST  (INCLUDING MRCP) TECHNIQUE: Multiplanar multisequence MR imaging of the abdomen was performed. Heavily T2-weighted images of the biliary and pancreatic ducts were obtained, and three-dimensional MRCP images were rendered by post processing. COMPARISON:  CT abdomen pelvis, 12/28/2020, percutaneous cholecystostomy, 12/29/2020 FINDINGS: Lower chest: Small bilateral pleural effusions and associated atelectasis or consolidation. Cardiomegaly. Hepatobiliary: Mild hepatic steatosis. No mass or other parenchymal abnormality  identified. The gallbladder is distended, with wall thickening and pericholecystic fluid, containing numerous tiny gallstones. Percutaneous cholecystostomy tube is in place, with formed pigtail in the gallbladder fundus. There is a small calculus in the distal common bile duct measuring 4 mm (series 8, image 27). No biliary ductal dilatation. Mild periportal edema. Pancreas: No mass, inflammatory changes, or other parenchymal abnormality identified. Spleen:  Splenomegaly, maximum coronal span 15.5 cm. Adrenals/Urinary Tract: No masses identified. No evidence of hydronephrosis. Stomach/Bowel: Visualized portions within the abdomen are unremarkable. Vascular/Lymphatic: No pathologically enlarged lymph nodes identified. No abdominal aortic aneurysm demonstrated. Other:  Small volume perihepatic ascites. Musculoskeletal: No suspicious bone lesions identified. IMPRESSION: 1. Percutaneous cholecystostomy tube is in place, with formed pigtail in the gallbladder fundus. 2. The gallbladder is distended, with wall thickening and pericholecystic fluid, containing numerous tiny gallstones. Findings are consistent with acute cholecystitis. 3. There is a 4 mm calculus in the distal common bile duct within the pancreatic head. No biliary ductal dilatation. 4. Mild hepatic steatosis. 5. Splenomegaly. 6. Small volume perihepatic ascites. 7. Small bilateral pleural effusions and associated atelectasis or consolidation. 8. Cardiomegaly. Electronically Signed   By:  Eddie Candle M.D.   On: 12/29/2020 13:40   IR Perc Cholecystostomy  Result Date: 12/29/2020 INDICATION: 65 year old male with acute cholecystitis EXAM: ULTRASOUND AND FLUOROSCOPIC-GUIDED CHOLECYSTOSTOMY TUBE PLACEMENT COMPARISON:  CT Abdomen Pelvis, 12/28/2020. MEDICATIONS: The patient is currently admitted to the hospital and on intravenous antibiotics. Antibiotics were administered within an appropriate time frame prior to skin puncture. ANESTHESIA/SEDATION: Moderate  (conscious) sedation was employed during this procedure. A total of Versed 1.5 mg and Fentanyl 75 mcg was administered intravenously. Moderate Sedation Time: 26 minutes. The patient's level of consciousness and vital signs were monitored continuously by radiology nursing throughout the procedure under my direct supervision. CONTRAST:  89m OMNIPAQUE IOHEXOL 300 MG/ML SOLN - administered into the gallbladder fossa. FLUOROSCOPY TIME:  1 minutes 0 seconds (9 mGy) COMPLICATIONS: None immediate. PROCEDURE: Informed written consent was obtained from the patient and/or patient's representative after a discussion of the risks, benefits and alternatives to treatment. Questions regarding the procedure were encouraged and answered. A timeout was performed prior to the initiation of the procedure. The right upper abdominal quadrant was prepped and draped in the usual sterile fashion, and a sterile drape was applied covering the operative field. Maximum barrier sterile technique with sterile gowns and gloves were used for the procedure. A timeout was performed prior to the initiation of the procedure. Local anesthesia was provided with 1% lidocaine with epinephrine. Ultrasound scanning of the right upper quadrant demonstrates a markedly dilated gallbladder. Utilizing a transhepatic approach, a 22 gauge needle was advanced into the gallbladder under direct ultrasound guidance. An ultrasound image was saved for documentation purposes. Appropriate intraluminal puncture was confirmed with the efflux of bile and advancement of an 0.018 wire into the gallbladder lumen. The needle was exchanged for an A[place]. A small amount of contrast was injected to confirm appropriate intraluminal positioning. Over a [name] wire, a 142-French Cook cholecystomy tube was advanced into the gallbladder fossa, coiled and locked. Bile was aspirated and a small amount of contrast was injected as several post procedural spot radiographic images were  obtained in various obliquities. The catheter was secured to the skin with suture, connected to a drainage bag and a dressing was placed. The patient tolerated the procedure well without immediate post procedural complication. IMPRESSION: Successful placement of a 10.2 French cholecystostomy tube, as above. JMichaelle Birks MD [facility] Electronically Signed   By: JMichaelle BirksMD   On: 12/29/2020 16:50   DG CHEST PORT 1 VIEW  Result Date: 12/31/2020 CLINICAL DATA:  Hypoxia. EXAM: PORTABLE CHEST 1 VIEW COMPARISON:  12/31/2020. FINDINGS: Cardiomegaly. No pulmonary venous congestion. Interim decrease opacification right hemithorax with interim visualization of the right upper lung. Findings consistent with partial resolution of large right pleural effusion. Large right pleural effusion remains. Underlying pulmonary disease cannot be excluded. No pneumothorax. IMPRESSION: 1. Findings consistent with interim partial resolution of large right pleural effusion. Large right pleural effusion remains. Underlying right lung disease cannot be excluded. 2.  Cardiomegaly.  No pulmonary venous congestion. Electronically Signed   By: TMarcello Moores Register   On: 12/31/2020 06:07   DG Chest Port 1 View  Result Date: 12/31/2020 CLINICAL DATA:  Acute respiratory distress. EXAM: PORTABLE CHEST 1 VIEW COMPARISON:  Chest radiograph dated 12/29/2020. FINDINGS: There is complete opacification of the right imaged thorax with slight shift of the mediastinum to the right of the midline suggestive of volume loss. This is new since the prior radiograph and may represent atelectasis, infiltrate, or combination  of pleural effusion and associated atelectasis/infiltrate. Occlusion of the central airway is not excluded. Linear density at the left lung base as seen on the prior radiograph. No pneumothorax. No acute osseous pathology. IMPRESSION: Complete opacification of the right imaged thorax  with slight shift of the mediastinum to the right of the midline suggestive of volume loss. Clinical correlation recommended. These results were called by telephone at the time of interpretation on 12/31/2020 at 2:10 am to nurse Riley Churches , who verbally acknowledged these results. Electronically Signed   By: Anner Crete M.D.   On: 12/31/2020 02:18   DG CHEST PORT 1 VIEW  Result Date: 12/29/2020 CLINICAL DATA:  Hypoxia EXAM: PORTABLE CHEST 1 VIEW COMPARISON:  Chest x-ray 12/28/2020, CT abdomen pelvis 12/28/2020 FINDINGS: Enlarged cardiac silhouette. The heart size and mediastinal contours are unchanged. Low lung volumes with bibasilar streaky airspace opacities likely representing atelectasis. Elevated right hemidiaphragm no focal consolidation. No pulmonary edema. Trace right pleural effusion. No left pleural effusion. No pneumothorax. No acute osseous abnormality. IMPRESSION: 1. Trace right pleural effusion. 2. Low lung volumes. 3. Elevated right hemidiaphragm. Electronically Signed   By: Iven Finn M.D.   On: 12/29/2020 03:59   DG Chest Portable 1 View  Result Date: 12/28/2020 CLINICAL DATA:  Tachycardia. EXAM: PORTABLE CHEST 1 VIEW COMPARISON:  04/11/2020 FINDINGS: Low lung volumes persist. Stable heart size, likely accentuated by technique and low lung volumes. Unchanged mediastinal contours. Streaky bibasilar atelectasis or scarring. No pulmonary edema, pleural effusion or pneumothorax. Patient's chin partially obscures the right lung apex. No acute osseous abnormalities are seen. IMPRESSION: Low lung volumes with streaky bibasilar atelectasis or scarring. Electronically Signed   By: Keith Rake M.D.   On: 12/28/2020 22:58   DG ERCP  Result Date: 12/31/2020 CLINICAL DATA:  65 year old male undergoing ERCP for choledocholithiasis EXAM: ERCP TECHNIQUE: Multiple spot images obtained with the fluoroscopic device and submitted for interpretation post-procedure. FLUOROSCOPY TIME:   Fluoroscopy Time:  3 minutes 55 seconds Radiation Exposure Index (if provided by the fluoroscopic device): 182.4 Number of Acquired Spot Images: 0 COMPARISON:  MRCP 12/29/2020 FINDINGS: A total of 14 intraoperative saved images are submitted for review. The images demonstrate a flexible duodenal scope in the descending duodenum. A percutaneous cholecystostomy tube is present. Subsequent images demonstrate cannulation of the common bile duct. Cholangiogram demonstrates a small filling defect in the distal common bile duct consistent with choledocholithiasis. The filling defect is no longer present on the final images consistent with removal. IMPRESSION: 1. Choledocholithiasis. 2. Percutaneous cholecystostomy tube in place. 3. ERCP as above. These images were submitted for radiologic interpretation only. Please see the procedural report for the amount of contrast and the fluoroscopy time utilized. Electronically Signed   By: Jacqulynn Cadet M.D.   On: 12/31/2020 08:41   US Abdomen Limited RUQ (LIVER/GB)  Result Date: 12/28/2020 CLINICAL DATA:  Abdominal pain. EXAM: ULTRASOUND ABDOMEN LIMITED RIGHT UPPER QUADRANT COMPARISON:  CT abdomen pelvis 04/11/2020 FINDINGS: Gallbladder: Abnormal gallbladder markedly limited evaluation - question of a heterogeneous structure distending the gallbladder. Common bile duct: Diameter: 4 mm Liver: Markedly limited evaluation. Unable to evaluate for focal hepatic lesion. Normal parenchymal echogenicity. Portal vein is patent on color Doppler imaging with normal direction of blood flow towards the liver. Other: None. IMPRESSION: 1. Abnormal gallbladder with markedly limited/nondiagnostic evaluation - question of a heterogeneous structure distending the gallbladder. Recommend CT abdomen pelvis with intravenous contrast for further evaluation. 2. Markedly limited evaluation of the liver due to decreased penetration of  the sonographic waves. Electronically Signed   By: Iven Finn  M.D.   On: 12/28/2020 22:45    Labs:  CBC: Recent Labs    12/28/20 2141 12/29/20 0340 12/30/20 0032 12/31/20 0641  WBC 20.4* 16.7* 9.3 13.1*  HGB 11.9* 11.3* 11.3* 9.9*  HCT 38.6* 35.5* 36.2* 33.9*  PLT 198 165 142* 142*    COAGS: Recent Labs    12/28/20 2141 12/29/20 0340 12/30/20 0032 12/31/20 0641  INR 1.3* 1.3* 1.4* 1.4*    BMP: Recent Labs    01/16/20 1529 01/31/20 1129 03/12/20 1202 03/26/20 1137 12/28/20 2141 12/29/20 0340 12/30/20 0032 12/31/20 0641  NA 138 136 136 137 131* 135 138 138  K 3.7 3.9 5.0 5.0 3.8 3.5 3.4* 4.1  CL 90* 90* 93* 94* 89* 94* 96* 99  CO2 30* '27 27 23 27 30 29 26  '$ GLUCOSE 143* 167* 130* 184* 248* 207* 181* 238*  BUN 42* 34* 31* 59* 32* 28* 28* 35*  CALCIUM 7.0* 7.7* 8.8 9.3 9.2 8.8* 9.1 8.8*  CREATININE 2.20* 1.74* 1.70* 1.57* 1.92* 1.77* 1.71* 2.13*  GFRNONAA 30* 40* 41* 46* 38* 42* 44* 34*  GFRAA 35* 47* 48* 53*  --   --   --   --     LIVER FUNCTION TESTS: Recent Labs    12/28/20 2141 12/29/20 0340 12/30/20 0032 12/31/20 0641  BILITOT 5.2* 4.6* 4.4* 4.7*  AST 54* 48* 54* 67*  ALT '24 23 26 28  '$ ALKPHOS 267* 244* 200* 212*  PROT 7.6 6.7 6.7 6.2*  ALBUMIN 2.0* 1.7* 1.8* 1.5*    Assessment and Plan:  Acute cholecystitis = s/p d rain by Dr. Earleen Newport 12/28/20   Continue routine drain care.   Perc chole drain to remain in place at least 6 weeks.   If General Surgery deems patient will be a surgical candidate once medically stable, recommend drain injection to evaluate patency of the cystic duct prior to surgery.   If General Surgery feels patient is never a candidate for cholecystectomy, recommend drain injection to evaluate patency of the cystic duct. If patent, will cap tube for trial. If symptoms re-occur, put back to gravity bag.   If patient tolerates capping trial, can discuss removal of the tube. If patient is NOT a candidate for removal of the tube, he will need routine exchange of the perc chole every 10-12  weeks.  Will place follow up orders for drain injection when patient closer to discharge.  Electronically Signed: Murrell Redden, PA-C 12/31/2020, 11:11 AM    I spent a total of 15 Minutes at the the patient's bedside AND on the patient's hospital floor or unit, greater than 50% of which was counseling/coordinating care for f/u perc chole.

## 2020-12-31 NOTE — Progress Notes (Signed)
PROGRESS NOTE    Matthew Gutierrez  [id number] DOB: 07-14-1955 DOA: 12/28/2020 PCP: Lillard Anes, MD   Brief Narrative:  65 year old with history of DM2, CKD stage III, A. fib, COPD, peripheral neuropathy secondary to DM2, stroke, OSA, PVD admitted to the hospital from SNF due to altered mental status and hypoxia.  She was found to be febrile and septic in the ED.  Work-up showed acute cholecystitis, general surgery was consulted who recommended MRCP which was positive.  Started on IV antibiotics.  Underwent ERCP 8/3 which showed choledocholithiasis, completed biliary sphincterotomy and balloon extraction.  Sludge and pus was removed from CBD.  Overnight on 8/3 patient was in respiratory distress, A. fib with RVR. Transferred to the ICU on 12/31/20.   Assessment & Plan:   Principal Problem:   Acute cholecystitis Active Problems:   Essential hypertension   H/O: stroke   Acute renal failure superimposed on stage 3 chronic kidney disease (HCC)   COPD (chronic obstructive pulmonary disease) ([place])   Long term (current) use of anticoagulants   Obstructive sleep apnea   Paroxysmal atrial fibrillation (HCC)   Sepsis (HCC)   Seizure disorder (HCC)   Encephalopathy   Abnormal magnetic resonance cholangiopancreatography (MRCP)   Calculus of bile duct with acute cholangitis with obstruction   Elevated LFTs   Acute respiratory failure with hypoxia and hypercarbia on BiPAP - Chest x-ray showed right lung atelectasis.  Patient placed on BiPAP 90% FiO2 - Pulmonary team consulted.  If fails to improve, may require intubation - Supportive care Transfer to the ICU due to hemodynamic instability.   Atrial fibrillation with RVR -Likely driven by underlying pulmonary etiology.  Currently on metoprolol and amiodarone.  Resume Heparin drip, defer to CCM depending on any procedures are planned.  [facility].   Severe sepsis secondary to acute cholecystitis Klebsiella  bacteremia Gram-positive cocci in anaerobic bottle -Sepsis physiology is improving.  Fever, leukocytosis, lactic acidosis - IV antibiotics- Rocephin & Flagyl. No need to add vanc for now, monitor clinically.  - Seen by general surgery, high risk for surgery therefore cholecystostomy tube placed by IR 8/2 - Repeat Cultures ordered.  - S/p ERCP 8/3- showed choledocholithiasis, completed biliary sphincterotomy and balloon extraction  Urinary tract infection - Urine cultures not sent on admission.  Already on IV Rocephin    Essential hypertension -Continue home medications-currently on hold.  As needed IV hydralazine/Lopressor ordered   AKI stage 3b chronic kidney disease Baseline Cr 1.8. increased to 2.13. Likely from hypotension.      COPD (chronic obstructive pulmonary disease) Albuterol nebulizer as needed for shortness of breath.     Encephalopathy,Improved. Suspect vascular Infarcts.  -Likely from underlying infection. CT head shows advances senescent changes. Multiple remote infarcts.      Obstructive sleep apnea -CPAP nightly     Seizure disorder -Continue gabapentin and Depakote     H/O: stroke     Long term (current) use of anticoagulants      DVT prophylaxis: Hep Drip Code Status: Full Family Communication:  Family is at the bedside.   Status is: Inpatient  Remains inpatient appropriate because:Inpatient level of care appropriate due to severity of illness  Dispo: The patient is from: Home              Anticipated d/c is to: Home              Patient currently is not medically stable to d/c.   Difficult to place patient No  Nutritional status           Body mass index is 39.32 kg/m.  Pressure Injury 12/29/20 Heel Left;Lateral Unstageable - Full thickness tissue loss in which the base of the injury is covered by slough (yellow, tan, gray, green or brown) and/or eschar (tan, brown or black) in the wound bed. (Active)  12/29/20 2300  Location:  Heel  Location Orientation: Left;Lateral  Staging: Unstageable - Full thickness tissue loss in which the base of the injury is covered by slough (yellow, tan, gray, green or brown) and/or eschar (tan, brown or black) in the wound bed.  Wound Description (Comments):   Present on Admission: Yes     Pressure Injury 12/29/20 Ankle Left;Lateral Unstageable - Full thickness tissue loss in which the base of the injury is covered by slough (yellow, tan, gray, green or brown) and/or eschar (tan, brown or black) in the wound bed. (Active)  12/29/20 2300  Location: Ankle  Location Orientation: Left;Lateral  Staging: Unstageable - Full thickness tissue loss in which the base of the injury is covered by slough (yellow, tan, gray, green or brown) and/or eschar (tan, brown or black) in the wound bed.  Wound Description (Comments):   Present on Admission: Yes     Pressure Injury 12/29/20 Ankle Left;Medial;Lateral Unstageable - Full thickness tissue loss in which the base of the injury is covered by slough (yellow, tan, gray, green or brown) and/or eschar (tan, brown or black) in the wound bed. (Active)  12/29/20 2300  Location: Ankle  Location Orientation: Left;Medial;Lateral  Staging: Unstageable - Full thickness tissue loss in which the base of the injury is covered by slough (yellow, tan, gray, green or brown) and/or eschar (tan, brown or black) in the wound bed.  Wound Description (Comments):   Present on Admission: Yes     Pressure Injury 12/29/20 Ankle Right;Lateral Unstageable - Full thickness tissue loss in which the base of the injury is covered by slough (yellow, tan, gray, green or brown) and/or eschar (tan, brown or black) in the wound bed. (Active)  12/29/20 2300  Location: Ankle  Location Orientation: Right;Lateral  Staging: Unstageable - Full thickness tissue loss in which the base of the injury is covered by slough (yellow, tan, gray, green or brown) and/or eschar (tan, brown or black) in  the wound bed.  Wound Description (Comments):   Present on Admission: Yes     Pressure Injury 12/29/20 Heel Right;Lateral Unstageable - Full thickness tissue loss in which the base of the injury is covered by slough (yellow, tan, gray, green or brown) and/or eschar (tan, brown or black) in the wound bed. (Active)  12/29/20 2300  Location: Heel  Location Orientation: Right;Lateral  Staging: Unstageable - Full thickness tissue loss in which the base of the injury is covered by slough (yellow, tan, gray, green or brown) and/or eschar (tan, brown or black) in the wound bed.  Wound Description (Comments):   Present on Admission: Yes     Pressure Injury Foot Right;Lateral Unstageable - Full thickness tissue loss in which the base of the injury is covered by slough (yellow, tan, gray, green or brown) and/or eschar (tan, brown or black) in the wound bed. (Active)     Location: Foot  Location Orientation: Right;Lateral  Staging: Unstageable - Full thickness tissue loss in which the base of the injury is covered by slough (yellow, tan, gray, green or brown) and/or eschar (tan, brown or black) in the wound bed.  Wound Description (Comments):  Present on Admission: Yes    Subjective: Overnight patient was found to be lethargic, atrial fibrillation with RVR and hypotensive.  ABG showed hypoxic/hypercarbic failure and placed on BiPAP.  Critical care team was consulted.  Patient was started on amiodarone drip.  During my visit, patient appeared generally ill. Family was present at bedside, He was on bipap with HR in 140 (a fib)   Review of Systems Otherwise negative except as per HPI, including: General = no fevers, chills, dizziness,  fatigue HEENT/EYES = negative for loss of vision, double vision, blurred vision,  sore throa Cardiovascular= negative for chest pain, palpitation Respiratory/lungs= negative for shortness of breath, cough, wheezing; hemoptysis,  Gastrointestinal= negative for  nausea, vomiting, abdominal pain Genitourinary= negative for Dysuria MSK = Negative for arthralgia, myalgias Neurology= Negative for headache, numbness, tingling  Psychiatry= Negative for suicidal and homocidal ideation Skin= Negative for Rash   Examination:  Constitutional: mild resp distress, on BiPAP Respiratory: diminished BS on the right  Cardiovascular: Normal sinus rhythm, no rubs Abdomen: Nontender nondistended good bowel sounds Musculoskeletal: No edema noted Skin: No rashes seen Neurologic: CN 2-12 grossly intact.  And nonfocal Psychiatric: Poor judgement and insight. Aaox1-2   Objective: Vitals:   12/31/20 0400 12/31/20 0500 12/31/20 0600 12/31/20 0748  BP: 108/69 (!) 88/57 (!) 88/58 125/87  Pulse: 60 (!) 32 (!) 116 87  Resp:    (!) 25  Temp:    (!) 96.7 F (35.9 C)  TempSrc:    Axillary  SpO2: 100% 100% 100% 100%  Weight:      Height:        Intake/Output Summary (Last 24 hours) at 12/31/2020 0805 Last data filed at 12/31/2020 0752 Gross per 24 hour  Intake 505 ml  Output 1300 ml  Net -795 ml   Filed Weights   12/28/20 2232 12/30/20 0355  Weight: 130.6 kg 131.5 kg     Data Reviewed:   CBC: Recent Labs  Lab 12/28/20 2141 12/29/20 0340 12/30/20 0032 12/31/20 0641  WBC 20.4* 16.7* 9.3 13.1*  NEUTROABS 17.7*  --  7.4  --   HGB 11.9* 11.3* 11.3* 9.9*  HCT 38.6* 35.5* 36.2* 33.9*  MCV 86.2 86.0 87.2 90.9  PLT 198 165 142* [id number]*   Basic Metabolic Panel: Recent Labs  Lab 12/28/20 2141 12/29/20 0340 12/30/20 0032 12/31/20 0641  NA 131* 135 138 138  K 3.8 3.5 3.4* 4.1  CL 89* 94* 96* 99  CO2 '27 30 29 26  '$ GLUCOSE 248* 207* 181* 238*  BUN 32* 28* 28* 35*  CREATININE 1.92* 1.77* 1.71* 2.13*  CALCIUM 9.2 8.8* 9.1 8.8*  MG  --   --  1.4* 1.8   GFR: Estimated Creatinine Clearance: 47.9 mL/min (A) (by C-G formula based on SCr of 2.13 mg/dL (H)). Liver Function Tests: Recent Labs  Lab 12/28/20 2141 12/29/20 0340 12/30/20 0032 12/31/20 0641   AST 54* 48* 54* 67*  ALT '24 23 26 28  '$ ALKPHOS 267* 244* 200* 212*  BILITOT 5.2* 4.6* 4.4* 4.7*  PROT 7.6 6.7 6.7 6.2*  ALBUMIN 2.0* 1.7* 1.8* 1.5*   No results for input(s): LIPASE, AMYLASE in the last 168 hours. Recent Labs  Lab 12/28/20 2200  AMMONIA 16   Coagulation Profile: Recent Labs  Lab 12/28/20 2141 12/29/20 0340 12/30/20 0032 12/31/20 0641  INR 1.3* 1.3* 1.4* 1.4*   Cardiac Enzymes: No results for input(s): CKTOTAL, CKMB, CKMBINDEX, TROPONINI in the last 168 hours. BNP (last 3 results) No results for input(s): PROBNP  in the last 8760 hours. HbA1C: Recent Labs    12/29/20 0340  HGBA1C 8.6*   CBG: Recent Labs  Lab 12/30/20 1801 12/30/20 1935 12/31/20 0025 12/31/20 0150 12/31/20 0615  GLUCAP 159* 180* 213* 206* 244*   Lipid Profile: No results for input(s): CHOL, HDL, LDLCALC, TRIG, CHOLHDL, LDLDIRECT in the last 72 hours. Thyroid Function Tests: Recent Labs    12/28/20 2200  TSH 1.844   Anemia Panel: No results for input(s): VITAMINB12, FOLATE, FERRITIN, TIBC, IRON, RETICCTPCT in the last 72 hours. Sepsis Labs: Recent Labs  Lab 12/28/20 2145 12/28/20 2230 12/29/20 0340  LATICACIDVEN 2.2* 2.5* 1.4    Recent Results (from the past 240 hour(s))  Resp Panel by RT-PCR (Flu A&B, Covid) Nasopharyngeal Swab     Status: None   Collection Time: 12/28/20  9:42 PM   Specimen: Nasopharyngeal Swab; Nasopharyngeal(NP) swabs in vial transport medium  Result Value Ref Range Status   SARS Coronavirus 2 by RT PCR NEGATIVE NEGATIVE Final    Comment: (NOTE) SARS-CoV-2 target nucleic acids are NOT DETECTED.  The SARS-CoV-2 RNA is generally detectable in upper respiratory specimens during the acute phase of infection. The lowest concentration of SARS-CoV-2 viral copies this assay can detect is 138 copies/mL. A negative result does not preclude SARS-Cov-2 infection and should not be used as the sole basis for treatment or other patient management  decisions. A negative result may occur with  improper specimen collection/handling, submission of specimen other than nasopharyngeal swab, presence of viral mutation(s) within the areas targeted by this assay, and inadequate number of viral copies(<138 copies/mL). A negative result must be combined with clinical observations, patient history, and epidemiological information. The expected result is Negative.  Fact Sheet for Patients:  EntrepreneurPulse.com.au  Fact Sheet for Healthcare Providers:  IncredibleEmployment.be  This test is no t yet approved or cleared by the [place] FDA and  has been authorized for detection and/or diagnosis of SARS-CoV-2 by FDA under an Emergency Use Authorization (EUA). This EUA will remain  in effect (meaning this test can be used) for the duration of the COVID-19 declaration under Section 564(b)(1) of the Act, 21 U.S.C.section 360bbb-3(b)(1), unless the authorization is terminated  or revoked sooner.       Influenza A by PCR NEGATIVE NEGATIVE Final   Influenza B by PCR NEGATIVE NEGATIVE Final    Comment: (NOTE) The Xpert Xpress SARS-CoV-2/FLU/RSV plus assay is intended as an aid in the diagnosis of influenza from Nasopharyngeal swab specimens and should not be used as a sole basis for treatment. Nasal washings and aspirates are unacceptable for Xpert Xpress SARS-CoV-2/FLU/RSV testing.  Fact Sheet for Patients: EntrepreneurPulse.com.au  Fact Sheet for Healthcare Providers: IncredibleEmployment.be  This test is not yet approved or cleared by the [place] FDA and has been authorized for detection and/or diagnosis of SARS-CoV-2 by FDA under an Emergency Use Authorization (EUA). This EUA will remain in effect (meaning this test can be used) for the duration of the COVID-19 declaration under Section 564(b)(1) of the Act, 21 U.S.C. section 360bbb-3(b)(1), unless the  authorization is terminated or revoked.  Performed at [facility], [place] [address], [place], [place] [address]   Culture, blood (Routine x 2)     Status: Abnormal (Preliminary result)   Collection Time: 12/28/20  9:45 PM   Specimen: BLOOD  Result Value Ref Range Status   Specimen Description BLOOD RIGHT ANTECUBITAL  Final   Special Requests   Final    BOTTLES DRAWN AEROBIC AND  ANAEROBIC Blood Culture adequate volume   Culture  Setup Time   Final    GRAM NEGATIVE RODS AEROBIC BOTTLE ONLY CRITICAL RESULT CALLED TO, READ BACK BY AND VERIFIED WITH: RN C.COBB ON [id number] AT 1322 BY E.PARRISH    Culture (A)  Final    KLEBSIELLA PNEUMONIAE SUSCEPTIBILITIES TO FOLLOW Performed at [facility], [place] [address], [place], [place] [address]    Report Status PENDING  Incomplete  Blood Culture ID Panel (Reflexed)     Status: Abnormal   Collection Time: 12/28/20  9:45 PM  Result Value Ref Range Status   Enterococcus faecalis NOT DETECTED NOT DETECTED Final   Enterococcus Faecium NOT DETECTED NOT DETECTED Final   Listeria monocytogenes NOT DETECTED NOT DETECTED Final   Staphylococcus species NOT DETECTED NOT DETECTED Final   Staphylococcus aureus (BCID) NOT DETECTED NOT DETECTED Final   Staphylococcus epidermidis NOT DETECTED NOT DETECTED Final   Staphylococcus lugdunensis NOT DETECTED NOT DETECTED Final   Streptococcus species NOT DETECTED NOT DETECTED Final   Streptococcus agalactiae NOT DETECTED NOT DETECTED Final   Streptococcus pneumoniae NOT DETECTED NOT DETECTED Final   Streptococcus pyogenes NOT DETECTED NOT DETECTED Final   A.calcoaceticus-baumannii NOT DETECTED NOT DETECTED Final   Bacteroides fragilis NOT DETECTED NOT DETECTED Final   Enterobacterales DETECTED (A) NOT DETECTED Final    Comment: Enterobacterales represent a large order of gram negative bacteria, not a single organism. CRITICAL RESULT CALLED TO, READ BACK BY AND VERIFIED WITH: RN C.COBB ON [id number] AT  1322 BY E.PARRISH    Enterobacter cloacae complex NOT DETECTED NOT DETECTED Final   Escherichia coli NOT DETECTED NOT DETECTED Final   Klebsiella aerogenes NOT DETECTED NOT DETECTED Final   Klebsiella oxytoca NOT DETECTED NOT DETECTED Final   Klebsiella pneumoniae DETECTED (A) NOT DETECTED Final    Comment: CRITICAL RESULT CALLED TO, READ BACK BY AND VERIFIED WITH: RN C.COBB ON [id number] AT 1322 BY E.PARRISH    Proteus species NOT DETECTED NOT DETECTED Final   Salmonella species NOT DETECTED NOT DETECTED Final   Serratia marcescens NOT DETECTED NOT DETECTED Final   Haemophilus influenzae NOT DETECTED NOT DETECTED Final   Neisseria meningitidis NOT DETECTED NOT DETECTED Final   Pseudomonas aeruginosa NOT DETECTED NOT DETECTED Final   Stenotrophomonas maltophilia NOT DETECTED NOT DETECTED Final   Candida albicans NOT DETECTED NOT DETECTED Final   Candida auris NOT DETECTED NOT DETECTED Final   Candida glabrata NOT DETECTED NOT DETECTED Final   Candida krusei NOT DETECTED NOT DETECTED Final   Candida parapsilosis NOT DETECTED NOT DETECTED Final   Candida tropicalis NOT DETECTED NOT DETECTED Final   Cryptococcus neoformans/gattii NOT DETECTED NOT DETECTED Final   CTX-M ESBL NOT DETECTED NOT DETECTED Final   Carbapenem resistance IMP NOT DETECTED NOT DETECTED Final   Carbapenem resistance KPC NOT DETECTED NOT DETECTED Final   Carbapenem resistance NDM NOT DETECTED NOT DETECTED Final   Carbapenem resist OXA 48 LIKE NOT DETECTED NOT DETECTED Final   Carbapenem resistance VIM NOT DETECTED NOT DETECTED Final    Comment: Performed at [facility], [address], [place], [place] [address]  Culture, blood (Routine x 2)     Status: Abnormal (Preliminary result)   Collection Time: 12/28/20 10:25 PM   Specimen: BLOOD  Result Value Ref Range Status   Specimen Description BLOOD LEFT HAND  Final   Special Requests   Final    BOTTLES DRAWN AEROBIC AND ANAEROBIC Blood Culture adequate volume  Culture  Setup Time   Final    GRAM NEGATIVE RODS AEROBIC BOTTLE ONLY GRAM POSITIVE COCCI IN CLUSTERS ANAEROBIC BOTTLE ONLY CRITICAL RESULT CALLED TO, READ BACK BY AND VERIFIED WITH: C PIERCE,PHARMD '@2209'$  12/29/20 MKELLY    Culture (A)  Final    KLEBSIELLA PNEUMONIAE STAPHYLOCOCCUS COHNII CULTURE REINCUBATED FOR BETTER GROWTH Performed at [facility], [place] [address], [place], [place] [address]    Report Status PENDING  Incomplete  Blood Culture ID Panel (Reflexed)     Status: Abnormal   Collection Time: 12/28/20 10:25 PM  Result Value Ref Range Status   Enterococcus faecalis NOT DETECTED NOT DETECTED Final   Enterococcus Faecium NOT DETECTED NOT DETECTED Final   Listeria monocytogenes NOT DETECTED NOT DETECTED Final   Staphylococcus species DETECTED (A) NOT DETECTED Final    Comment: CRITICAL RESULT CALLED TO, READ BACK BY AND VERIFIED WITH: C PIERCE,PHARMD'@2209'$  12/29/20 MKELLY    Staphylococcus aureus (BCID) NOT DETECTED NOT DETECTED Final   Staphylococcus epidermidis NOT DETECTED NOT DETECTED Final   Staphylococcus lugdunensis NOT DETECTED NOT DETECTED Final   Streptococcus species NOT DETECTED NOT DETECTED Final   Streptococcus agalactiae NOT DETECTED NOT DETECTED Final   Streptococcus pneumoniae NOT DETECTED NOT DETECTED Final   Streptococcus pyogenes NOT DETECTED NOT DETECTED Final   A.calcoaceticus-baumannii NOT DETECTED NOT DETECTED Final   Bacteroides fragilis NOT DETECTED NOT DETECTED Final   Enterobacterales NOT DETECTED NOT DETECTED Final   Enterobacter cloacae complex NOT DETECTED NOT DETECTED Final   Escherichia coli NOT DETECTED NOT DETECTED Final   Klebsiella aerogenes NOT DETECTED NOT DETECTED Final   Klebsiella oxytoca NOT DETECTED NOT DETECTED Final   Klebsiella pneumoniae NOT DETECTED NOT DETECTED Final   Proteus species NOT DETECTED NOT DETECTED Final   Salmonella species NOT DETECTED NOT DETECTED Final   Serratia marcescens NOT DETECTED NOT  DETECTED Final   Haemophilus influenzae NOT DETECTED NOT DETECTED Final   Neisseria meningitidis NOT DETECTED NOT DETECTED Final   Pseudomonas aeruginosa NOT DETECTED NOT DETECTED Final   Stenotrophomonas maltophilia NOT DETECTED NOT DETECTED Final   Candida albicans NOT DETECTED NOT DETECTED Final   Candida auris NOT DETECTED NOT DETECTED Final   Candida glabrata NOT DETECTED NOT DETECTED Final   Candida krusei NOT DETECTED NOT DETECTED Final   Candida parapsilosis NOT DETECTED NOT DETECTED Final   Candida tropicalis NOT DETECTED NOT DETECTED Final   Cryptococcus neoformans/gattii NOT DETECTED NOT DETECTED Final    Comment: Performed at [facility], [address], [place], [place] [address]  Aerobic/Anaerobic Culture w Gram Stain (surgical/deep wound)     Status: None (Preliminary result)   Collection Time: 12/29/20 11:55 AM   Specimen: Gallbladder; Bile  Result Value Ref Range Status   Specimen Description GALL BLADDER  Final   Special Requests NONE  Final   Gram Stain PENDING  Incomplete   Culture   Final    ABUNDANT KLEBSIELLA PNEUMONIAE SUSCEPTIBILITIES TO FOLLOW CULTURE REINCUBATED FOR BETTER GROWTH Performed at [facility], [place] [address] [place], [place] [address]    Report Status PENDING  Incomplete  MRSA Next Gen by PCR, Nasal     Status: None   Collection Time: 12/30/20 11:08 AM   Specimen: Nasal Mucosa; Nasal Swab  Result Value Ref Range Status   MRSA by PCR Next Gen NOT DETECTED NOT DETECTED Final    Comment: (NOTE) The GeneXpert MRSA Assay (FDA approved for NASAL specimens only), is one component of a  comprehensive MRSA colonization surveillance program. It is not intended to diagnose MRSA infection nor to guide or monitor treatment for MRSA infections. Test performance is not FDA approved in patients less than 40 years old. Performed at [facility], [place] [address] [place], [place] [address]          Radiology Studies: MR ABDOMEN  MRCP WO CONTRAST  Result Date: 12/29/2020 CLINICAL DATA:  Biliary obstruction, choledocholithiasis, altered mental status, acute cholecystitis, status post percutaneous cholecystostomy EXAM: MRI ABDOMEN WITHOUT CONTRAST  (INCLUDING MRCP) TECHNIQUE: Multiplanar multisequence MR imaging of the abdomen was performed. Heavily T2-weighted images of the biliary and pancreatic ducts were obtained, and three-dimensional MRCP images were rendered by post processing. COMPARISON:  CT abdomen pelvis, 12/28/2020, percutaneous cholecystostomy, 12/29/2020 FINDINGS: Lower chest: Small bilateral pleural effusions and associated atelectasis or consolidation. Cardiomegaly. Hepatobiliary: Mild hepatic steatosis. No mass or other parenchymal abnormality identified. The gallbladder is distended, with wall thickening and pericholecystic fluid, containing numerous tiny gallstones. Percutaneous cholecystostomy tube is in place, with formed pigtail in the gallbladder fundus. There is a small calculus in the distal common bile duct measuring 4 mm (series 8, image 27). No biliary ductal dilatation. Mild periportal edema. Pancreas: No mass, inflammatory changes, or other parenchymal abnormality identified. Spleen:  Splenomegaly, maximum coronal span 15.5 cm. Adrenals/Urinary Tract: No masses identified. No evidence of hydronephrosis. Stomach/Bowel: Visualized portions within the abdomen are unremarkable. Vascular/Lymphatic: No pathologically enlarged lymph nodes identified. No abdominal aortic aneurysm demonstrated. Other:  Small volume perihepatic ascites. Musculoskeletal: No suspicious bone lesions identified. IMPRESSION: 1. Percutaneous cholecystostomy tube is in place, with formed pigtail in the gallbladder fundus. 2. The gallbladder is distended, with wall thickening and pericholecystic fluid, containing numerous tiny gallstones. Findings are consistent with acute cholecystitis. 3. There is a 4 mm calculus in the distal common bile duct  within the pancreatic head. No biliary ductal dilatation. 4. Mild hepatic steatosis. 5. Splenomegaly. 6. Small volume perihepatic ascites. 7. Small bilateral pleural effusions and associated atelectasis or consolidation. 8. Cardiomegaly. Electronically Signed   By: Eddie Candle M.D.   On: 12/29/2020 13:40   IR Perc Cholecystostomy  Result Date: 12/29/2020 INDICATION: 65 year old male with acute cholecystitis EXAM: ULTRASOUND AND FLUOROSCOPIC-GUIDED CHOLECYSTOSTOMY TUBE PLACEMENT COMPARISON:  CT Abdomen Pelvis, 12/28/2020. MEDICATIONS: The patient is currently admitted to the hospital and on intravenous antibiotics. Antibiotics were administered within an appropriate time frame prior to skin puncture. ANESTHESIA/SEDATION: Moderate (conscious) sedation was employed during this procedure. A total of Versed 1.5 mg and Fentanyl 75 mcg was administered intravenously. Moderate Sedation Time: 26 minutes. The patient's level of consciousness and vital signs were monitored continuously by radiology nursing throughout the procedure under my direct supervision. CONTRAST:  92m OMNIPAQUE IOHEXOL 300 MG/ML SOLN - administered into the gallbladder fossa. FLUOROSCOPY TIME:  1 minutes 0 seconds (9 mGy) COMPLICATIONS: None immediate. PROCEDURE: Informed written consent was obtained from the patient and/or patient's representative after a discussion of the risks, benefits and alternatives to treatment. Questions regarding the procedure were encouraged and answered. A timeout was performed prior to the initiation of the procedure. The right upper abdominal quadrant was prepped and draped in the usual sterile fashion, and a sterile drape was applied covering the operative field. Maximum barrier sterile technique with sterile gowns and gloves were used for the procedure. A timeout was performed prior to the initiation of the procedure. Local anesthesia was provided with 1% lidocaine with epinephrine. Ultrasound scanning of the right  upper quadrant demonstrates a markedly dilated  gallbladder. Utilizing a transhepatic approach, a 22 gauge needle was advanced into the gallbladder under direct ultrasound guidance. An ultrasound image was saved for documentation purposes. Appropriate intraluminal puncture was confirmed with the efflux of bile and advancement of an 0.018 wire into the gallbladder lumen. The needle was exchanged for an [place] set. A small amount of contrast was injected to confirm appropriate intraluminal positioning. Over a [name] wire, a 69.2-French Cook cholecystomy tube was advanced into the gallbladder fossa, coiled and locked. Bile was aspirated and a small amount of contrast was injected as several post procedural spot radiographic images were obtained in various obliquities. The catheter was secured to the skin with suture, connected to a drainage bag and a dressing was placed. The patient tolerated the procedure well without immediate post procedural complication. IMPRESSION: Successful placement of a 10.2 French cholecystostomy tube, as above. Michaelle Birks, MD [facility] Radiology Electronically Signed   By: Michaelle Birks MD   On: 12/29/2020 16:50   DG CHEST PORT 1 VIEW  Result Date: 12/31/2020 CLINICAL DATA:  Hypoxia. EXAM: PORTABLE CHEST 1 VIEW COMPARISON:  12/31/2020. FINDINGS: Cardiomegaly. No pulmonary venous congestion. Interim decrease opacification right hemithorax with interim visualization of the right upper lung. Findings consistent with partial resolution of large right pleural effusion. Large right pleural effusion remains. Underlying pulmonary disease cannot be excluded. No pneumothorax. IMPRESSION: 1. Findings consistent with interim partial resolution of large right pleural effusion. Large right pleural effusion remains. Underlying right lung disease cannot be excluded. 2.  Cardiomegaly.  No pulmonary venous congestion. Electronically Signed   By: Marcello Moores   Register   On: 12/31/2020 06:07   DG Chest Port 1 View  Result Date: 12/31/2020 CLINICAL DATA:  Acute respiratory distress. EXAM: PORTABLE CHEST 1 VIEW COMPARISON:  Chest radiograph dated 12/29/2020. FINDINGS: There is complete opacification of the right imaged thorax with slight shift of the mediastinum to the right of the midline suggestive of volume loss. This is new since the prior radiograph and may represent atelectasis, infiltrate, or combination of pleural effusion and associated atelectasis/infiltrate. Occlusion of the central airway is not excluded. Linear density at the left lung base as seen on the prior radiograph. No pneumothorax. No acute osseous pathology. IMPRESSION: Complete opacification of the right imaged thorax with slight shift of the mediastinum to the right of the midline suggestive of volume loss. Clinical correlation recommended. These results were called by telephone at the time of interpretation on 12/31/2020 at 2:10 am to nurse Riley Churches , who verbally acknowledged these results. Electronically Signed   By: Anner Crete M.D.   On: 12/31/2020 02:18        Scheduled Meds:  collagenase   Topical Daily   gabapentin  300 mg Oral QHS   sodium chloride flush  5 mL Intracatheter Q12H   Continuous Infusions:  amiodarone 60 mg/hr (12/31/20 0711)   amiodarone     amiodarone     cefTRIAXone (ROCEPHIN)  IV 2 g (12/29/20 2222)   lactated ringers 75 mL/hr at 12/30/20 2200   metronidazole 500 mg (12/31/20 0129)   valproate sodium 625 mg (12/30/20 2207)     LOS: 2 days   Time spent= 35 mins    Demari Kropp Arsenio Loader, MD [facility]  If 7PM-7AM, please contact night-coverage  12/31/2020, 8:05 AM

## 2020-12-31 NOTE — Progress Notes (Signed)
MEDICATION RELATED CONSULT NOTE - INITIAL   Pharmacy Consult for Phenytoin Indication: Epilepsy  Allergies  Allergen Reactions   Codeine Other (See Comments)    Unknown Reaction    Patient Measurements: Height: 6' (182.9 cm) Weight: 131.5 kg (289 lb 14.5 oz) IBW/kg (Calculated) : 77.6  Vital Signs: Temp: 98.5 F (36.9 C) (08/04 1602) Temp Source: Axillary (08/04 1602) BP: 111/82 (08/04 1615) Pulse Rate: 87 (08/04 1623) Intake/Output from previous day: 08/03 0701 - 08/04 0700 In: 505 [I.V.:505] Out: 900 [Urine:900] Intake/Output from this shift: Total I/O In: 5 [Other:5] Out: 445 [Urine:400; Drains:45]  Labs: Recent Labs    12/29/20 0340 12/30/20 0032 12/31/20 0641  WBC 16.7* 9.3 13.1*  HGB 11.3* 11.3* 9.9*  HCT 35.5* 36.2* 33.9*  PLT 165 142* 142*  CREATININE 1.77* 1.71* 2.13*  MG  --  1.4* 1.8  ALBUMIN 1.7* 1.8* 1.5*  PROT 6.7 6.7 6.2*  AST 48* 54* 67*  ALT '23 26 28  '$ ALKPHOS 244* 200* 212*  BILITOT 4.6* 4.4* 4.7*  BILIDIR 2.8*  --   --   IBILI 1.8*  --   --    Estimated Creatinine Clearance: 47.9 mL/min (A) (by C-G formula based on SCr of 2.13 mg/dL (H)).   Microbiology: Recent Results (from the past 720 hour(s))  Resp Panel by RT-PCR (Flu A&B, Covid) Nasopharyngeal Swab     Status: None   Collection Time: 12/28/20  9:42 PM   Specimen: Nasopharyngeal Swab; Nasopharyngeal(NP) swabs in vial transport medium  Result Value Ref Range Status   SARS Coronavirus 2 by RT PCR NEGATIVE NEGATIVE Final    Comment: (NOTE) SARS-CoV-2 target nucleic acids are NOT DETECTED.  The SARS-CoV-2 RNA is generally detectable in upper respiratory specimens during the acute phase of infection. The lowest concentration of SARS-CoV-2 viral copies this assay can detect is 138 copies/mL. A negative result does not preclude SARS-Cov-2 infection and should not be used as the sole basis for treatment or other patient management decisions. A negative result may occur with   improper specimen collection/handling, submission of specimen other than nasopharyngeal swab, presence of viral mutation(s) within the areas targeted by this assay, and inadequate number of viral copies(<138 copies/mL). A negative result must be combined with clinical observations, patient history, and epidemiological information. The expected result is Negative.  Fact Sheet for Patients:  EntrepreneurPulse.com.au  Fact Sheet for Healthcare Providers:  IncredibleEmployment.be  This test is no t yet approved or cleared by the [place] FDA and  has been authorized for detection and/or diagnosis of SARS-CoV-2 by FDA under an Emergency Use Authorization (EUA). This EUA will remain  in effect (meaning this test can be used) for the duration of the COVID-19 declaration under Section 564(b)(1) of the Act, 21 U.S.C.section 360bbb-3(b)(1), unless the authorization is terminated  or revoked sooner.       Influenza A by PCR NEGATIVE NEGATIVE Final   Influenza B by PCR NEGATIVE NEGATIVE Final    Comment: (NOTE) The Xpert Xpress SARS-CoV-2/FLU/RSV plus assay is intended as an aid in the diagnosis of influenza from Nasopharyngeal swab specimens and should not be used as a sole basis for treatment. Nasal washings and aspirates are unacceptable for Xpert Xpress SARS-CoV-2/FLU/RSV testing.  Fact Sheet for Patients: EntrepreneurPulse.com.au  Fact Sheet for Healthcare Providers: IncredibleEmployment.be  This test is not yet approved or cleared by the [place] FDA and has been authorized for detection and/or diagnosis of SARS-CoV-2 by FDA under an Emergency Use Authorization (EUA). This EUA  will remain in effect (meaning this test can be used) for the duration of the COVID-19 declaration under Section 564(b)(1) of the Act, 21 U.S.C. section 360bbb-3(b)(1), unless the authorization is terminated  or revoked.  Performed at [facility], [place] [address] [place], [place] [address]   Culture, blood (Routine x 2)     Status: Abnormal   Collection Time: 12/28/20  9:45 PM   Specimen: BLOOD  Result Value Ref Range Status   Specimen Description BLOOD RIGHT ANTECUBITAL  Final   Special Requests   Final    BOTTLES DRAWN AEROBIC AND ANAEROBIC Blood Culture adequate volume   Culture  Setup Time   Final    GRAM NEGATIVE RODS AEROBIC BOTTLE ONLY CRITICAL RESULT CALLED TO, READ BACK BY AND VERIFIED WITH: RN C.COBB ON [id number] AT [id number] BY [place] Performed at [facility], [place] [address] [place], [place] [address]    Culture KLEBSIELLA PNEUMONIAE (A)  Final   Report Status 12/31/2020 FINAL  Final   Organism ID, Bacteria KLEBSIELLA PNEUMONIAE  Final      Susceptibility   Klebsiella pneumoniae - MIC*    AMPICILLIN >=32 RESISTANT Resistant     CEFAZOLIN <=4 SENSITIVE Sensitive     CEFEPIME <=0.12 SENSITIVE Sensitive     CEFTAZIDIME <=1 SENSITIVE Sensitive     CEFTRIAXONE <=0.25 SENSITIVE Sensitive     CIPROFLOXACIN <=0.25 SENSITIVE Sensitive     GENTAMICIN <=1 SENSITIVE Sensitive     IMIPENEM <=0.25 SENSITIVE Sensitive     TRIMETH/SULFA <=20 SENSITIVE Sensitive     AMPICILLIN/SULBACTAM 8 SENSITIVE Sensitive     PIP/TAZO <=4 SENSITIVE Sensitive     * KLEBSIELLA PNEUMONIAE  Blood Culture ID Panel (Reflexed)     Status: Abnormal   Collection Time: 12/28/20  9:45 PM  Result Value Ref Range Status   Enterococcus faecalis NOT DETECTED NOT DETECTED Final   Enterococcus Faecium NOT DETECTED NOT DETECTED Final   Listeria monocytogenes NOT DETECTED NOT DETECTED Final   Staphylococcus species NOT DETECTED NOT DETECTED Final   Staphylococcus aureus (BCID) NOT DETECTED NOT DETECTED Final   Staphylococcus epidermidis NOT DETECTED NOT DETECTED Final   Staphylococcus lugdunensis NOT DETECTED NOT DETECTED Final   Streptococcus species NOT DETECTED NOT DETECTED Final   Streptococcus  agalactiae NOT DETECTED NOT DETECTED Final   Streptococcus pneumoniae NOT DETECTED NOT DETECTED Final   Streptococcus pyogenes NOT DETECTED NOT DETECTED Final   A.calcoaceticus-baumannii NOT DETECTED NOT DETECTED Final   Bacteroides fragilis NOT DETECTED NOT DETECTED Final   Enterobacterales DETECTED (A) NOT DETECTED Final    Comment: Enterobacterales represent a large order of gram negative bacteria, not a single organism. CRITICAL RESULT CALLED TO, READ BACK BY AND VERIFIED WITH: RN C.COBB ON [id number] AT 1322 BY E.PARRISH    Enterobacter cloacae complex NOT DETECTED NOT DETECTED Final   Escherichia coli NOT DETECTED NOT DETECTED Final   Klebsiella aerogenes NOT DETECTED NOT DETECTED Final   Klebsiella oxytoca NOT DETECTED NOT DETECTED Final   Klebsiella pneumoniae DETECTED (A) NOT DETECTED Final    Comment: CRITICAL RESULT CALLED TO, READ BACK BY AND VERIFIED WITH: RN C.COBB ON [id number] AT 1322 BY E.PARRISH    Proteus species NOT DETECTED NOT DETECTED Final   Salmonella species NOT DETECTED NOT DETECTED Final   Serratia marcescens NOT DETECTED NOT DETECTED Final   Haemophilus influenzae NOT DETECTED NOT DETECTED Final   Neisseria meningitidis NOT DETECTED NOT DETECTED Final   Pseudomonas aeruginosa NOT DETECTED NOT DETECTED Final  Stenotrophomonas maltophilia NOT DETECTED NOT DETECTED Final   Candida albicans NOT DETECTED NOT DETECTED Final   Candida auris NOT DETECTED NOT DETECTED Final   Candida glabrata NOT DETECTED NOT DETECTED Final   Candida krusei NOT DETECTED NOT DETECTED Final   Candida parapsilosis NOT DETECTED NOT DETECTED Final   Candida tropicalis NOT DETECTED NOT DETECTED Final   Cryptococcus neoformans/gattii NOT DETECTED NOT DETECTED Final   CTX-M ESBL NOT DETECTED NOT DETECTED Final   Carbapenem resistance IMP NOT DETECTED NOT DETECTED Final   Carbapenem resistance KPC NOT DETECTED NOT DETECTED Final   Carbapenem resistance NDM NOT DETECTED NOT DETECTED Final    Carbapenem resist OXA 48 LIKE NOT DETECTED NOT DETECTED Final   Carbapenem resistance VIM NOT DETECTED NOT DETECTED Final    Comment: Performed at [facility], [address], [place], [place] [address]  Culture, blood (Routine x 2)     Status: Abnormal (Preliminary result)   Collection Time: 12/28/20 10:25 PM   Specimen: BLOOD  Result Value Ref Range Status   Specimen Description BLOOD LEFT HAND  Final   Special Requests   Final    BOTTLES DRAWN AEROBIC AND ANAEROBIC Blood Culture adequate volume   Culture  Setup Time   Final    GRAM NEGATIVE RODS AEROBIC BOTTLE ONLY GRAM POSITIVE COCCI IN CLUSTERS ANAEROBIC BOTTLE ONLY CRITICAL RESULT CALLED TO, READ BACK BY AND VERIFIED WITH: C PIERCE,PHARMD '@2209'$  12/29/20 MKELLY    Culture (A)  Final    KLEBSIELLA PNEUMONIAE SUSCEPTIBILITIES PERFORMED ON PREVIOUS CULTURE WITHIN THE LAST 5 DAYS. STAPHYLOCOCCUS COHNII THE SIGNIFICANCE OF ISOLATING THIS ORGANISM FROM A SINGLE SET OF BLOOD CULTURES WHEN MULTIPLE SETS ARE DRAWN IS UNCERTAIN. PLEASE NOTIFY THE MICROBIOLOGY DEPARTMENT WITHIN ONE WEEK IF SPECIATION AND SENSITIVITIES ARE REQUIRED. Performed at [facility], [place] [address], [place], [place] [address]    Report Status PENDING  Incomplete  Blood Culture ID Panel (Reflexed)     Status: Abnormal   Collection Time: 12/28/20 10:25 PM  Result Value Ref Range Status   Enterococcus faecalis NOT DETECTED NOT DETECTED Final   Enterococcus Faecium NOT DETECTED NOT DETECTED Final   Listeria monocytogenes NOT DETECTED NOT DETECTED Final   Staphylococcus species DETECTED (A) NOT DETECTED Final    Comment: CRITICAL RESULT CALLED TO, READ BACK BY AND VERIFIED WITH: C PIERCE,PHARMD'@2209'$  12/29/20 MKELLY    Staphylococcus aureus (BCID) NOT DETECTED NOT DETECTED Final   Staphylococcus epidermidis NOT DETECTED NOT DETECTED Final   Staphylococcus lugdunensis NOT DETECTED NOT DETECTED Final   Streptococcus species NOT DETECTED NOT DETECTED Final    Streptococcus agalactiae NOT DETECTED NOT DETECTED Final   Streptococcus pneumoniae NOT DETECTED NOT DETECTED Final   Streptococcus pyogenes NOT DETECTED NOT DETECTED Final   A.calcoaceticus-baumannii NOT DETECTED NOT DETECTED Final   Bacteroides fragilis NOT DETECTED NOT DETECTED Final   Enterobacterales NOT DETECTED NOT DETECTED Final   Enterobacter cloacae complex NOT DETECTED NOT DETECTED Final   Escherichia coli NOT DETECTED NOT DETECTED Final   Klebsiella aerogenes NOT DETECTED NOT DETECTED Final   Klebsiella oxytoca NOT DETECTED NOT DETECTED Final   Klebsiella pneumoniae NOT DETECTED NOT DETECTED Final   Proteus species NOT DETECTED NOT DETECTED Final   Salmonella species NOT DETECTED NOT DETECTED Final   Serratia marcescens NOT DETECTED NOT DETECTED Final   Haemophilus influenzae NOT DETECTED NOT DETECTED Final   Neisseria meningitidis NOT DETECTED NOT DETECTED Final   Pseudomonas aeruginosa NOT DETECTED NOT DETECTED Final  Stenotrophomonas maltophilia NOT DETECTED NOT DETECTED Final   Candida albicans NOT DETECTED NOT DETECTED Final   Candida auris NOT DETECTED NOT DETECTED Final   Candida glabrata NOT DETECTED NOT DETECTED Final   Candida krusei NOT DETECTED NOT DETECTED Final   Candida parapsilosis NOT DETECTED NOT DETECTED Final   Candida tropicalis NOT DETECTED NOT DETECTED Final   Cryptococcus neoformans/gattii NOT DETECTED NOT DETECTED Final    Comment: Performed at [facility], [place] [address], [place], [place] [address]  Aerobic/Anaerobic Culture w Gram Stain (surgical/deep wound)     Status: None (Preliminary result)   Collection Time: 12/29/20 11:55 AM   Specimen: Gallbladder; Bile  Result Value Ref Range Status   Specimen Description GALL BLADDER  Final   Special Requests NONE  Final   Gram Stain   Final    FEW WBC PRESENT,BOTH PMN AND MONONUCLEAR FEW GRAM NEGATIVE RODS Performed at [facility], [address], [place], [place] [address]     Culture   Final    ABUNDANT KLEBSIELLA PNEUMONIAE NO ANAEROBES ISOLATED; CULTURE IN PROGRESS FOR 5 DAYS    Report Status PENDING  Incomplete   Organism ID, Bacteria KLEBSIELLA PNEUMONIAE  Final      Susceptibility   Klebsiella pneumoniae - MIC*    AMPICILLIN >=32 RESISTANT Resistant     CEFAZOLIN <=4 SENSITIVE Sensitive     CEFEPIME <=0.12 SENSITIVE Sensitive     CEFTAZIDIME <=1 SENSITIVE Sensitive     CEFTRIAXONE <=0.25 SENSITIVE Sensitive     CIPROFLOXACIN <=0.25 SENSITIVE Sensitive     GENTAMICIN <=1 SENSITIVE Sensitive     IMIPENEM <=0.25 SENSITIVE Sensitive     TRIMETH/SULFA <=20 SENSITIVE Sensitive     AMPICILLIN/SULBACTAM 8 SENSITIVE Sensitive     PIP/TAZO <=4 SENSITIVE Sensitive     * ABUNDANT KLEBSIELLA PNEUMONIAE  MRSA Next Gen by PCR, Nasal     Status: None   Collection Time: 12/30/20 11:08 AM   Specimen: Nasal Mucosa; Nasal Swab  Result Value Ref Range Status   MRSA by PCR Next Gen NOT DETECTED NOT DETECTED Final    Comment: (NOTE) The GeneXpert MRSA Assay (FDA approved for NASAL specimens only), is one component of a comprehensive MRSA colonization surveillance program. It is not intended to diagnose MRSA infection nor to guide or monitor treatment for MRSA infections. Test performance is not FDA approved in patients less than 13 years old. Performed at [facility], [place] [address] [place], [place] [address]     Medical History: Past Medical History:  Diagnosis Date   Absence epileptic syndrome, not intractable, without status epilepticus ([place]) 07/23/2019   Acute renal failure superimposed on stage 3 chronic kidney disease ([place]) 07/23/2019   Atherosclerotic heart disease 07/23/2019   Atrial fibrillation with RVR ([place]) 12/14/2019   Benign enlargement of prostate 07/23/2019   Body mass index 50.0-59.9, adult ([place]) 07/23/2019   Cellulitis of leg 12/14/2019   Chronic respiratory failure with hypoxia ([place]) 07/23/2019   Chronic venous hypertension  (idiopathic) with ulcer of left lower extremity ([place]) 07/23/2019   COPD (chronic obstructive pulmonary disease) ([place]) 07/23/2019   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   Diabetic neuropathy ([place]) 07/23/2019   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   Epilepsy, grand mal ([place])    Essential hypertension 01/22/2018   Gout attack 10/06/2019   H/O: stroke 01/22/2018   History of DVT (deep vein thrombosis) 01/22/2018   Long term (current) use of anticoagulants 07/23/2019   Lumbago  Lymphedema of both lower extremities 01/22/2018   Mixed hyperlipidemia 07/23/2019   Morbid obesity ([place]) 07/23/2019   Obstructive sleep apnea 07/23/2019   Paroxysmal atrial fibrillation ([place]) 01/07/2020   Peripheral vascular disease, unspecified ([place]) 07/12/2011   Secondary hypercoagulable state ([place]) 01/07/2020   Shoulder bursitis 07/25/2019   Stroke (H[facility])     Medications:  Scheduled:   Chlorhexidine Gluconate Cloth  6 each Topical Daily   collagenase   Topical Daily   gabapentin  300 mg Oral QHS   metoprolol tartrate  12.5 mg Oral Q6H   sodium chloride flush  5 mL Intracatheter Q12H    Assessment: 61 YOM who [facility] on 8/2 from SNF with AMS/hypoxia, concern for sepsis. Phenytoin was held on admission now with plans to resume.   The patient's phenytoin level this afternoon is undetectable. Of noting, during the patient's admission in July 2021 - the dose was lowered from 300 mg ER bid to 100 mg ER bid. The patient was discharged before steady state levels were drawn but free levels in Oct 2021 were noted to be therapeutic presumably on the lower dosing regimen.   The patient is unable to take po so will resume the patient's PTA dose IV for now, will need to get levels at steady state if still here.   Goal of Therapy:  Total Phenytoin level of 10-20 mcg/ml (corrected for albumin and renal function) Free Phenytoin levels of 1-2 mcg/ml  Plan:  - Resume PTA Phenytoin dose - 100 mg IV every 12  hours for now - Will need to obtain levels at steady state in 5-7 days  Thank you for allowing pharmacy to be a part of this patient's care.  Alycia Rossetti, PharmD, BCPS Clinical Pharmacist Clinical phone for 12/31/2020: [phone] 12/31/2020 4:44 PM   **Pharmacist phone directory can now be found on [email or web address] ([email or web address]).  Listed under [place].

## 2020-12-31 NOTE — Progress Notes (Signed)
Patient ID: Matthew Gutierrez, male   DOB: 1955/11/14, 65 y.o.   MRN: [id number] 1 Day Post-Op   Subjective: BiPAP ROS negative except as listed above. Objective: Vital signs in last 24 hours: Temp:  [96.7 F (35.9 C)-101 F (38.3 C)] 96.7 F (35.9 C) (08/04 0748) Pulse Rate:  [32-143] 87 (08/04 0748) Resp:  [14-27] 25 (08/04 0748) BP: (80-171)/(51-106) 125/87 (08/04 0748) SpO2:  [83 %-100 %] 100 % (08/04 0748) FiO2 (%):  [100 %] 100 % (08/04 0300) Last BM Date: 12/30/20  Intake/Output from previous day: 08/03 0701 - 08/04 0700 In: 505 [I.V.:505] Out: 900 [Urine:900] Intake/Output this shift: Total I/O In: 5 [Other:5] Out: 445 [Urine:400; Drains:45]  General appearance: cooperative Resp: on BiPAP, distant Cardio: irregularly irregular rhythm GI: soft, less RUQ tenderness, chole drain  Lab Results: CBC  Recent Labs    12/30/20 0032 12/31/20 0641  WBC 9.3 13.1*  HGB 11.3* 9.9*  HCT 36.2* 33.9*  PLT 142* 142*   BMET Recent Labs    12/30/20 0032 12/31/20 0641  NA 138 138  K 3.4* 4.1  CL 96* 99  CO2 29 26  GLUCOSE 181* 238*  BUN 28* 35*  CREATININE 1.71* 2.13*  CALCIUM 9.1 8.8*   PT/INR Recent Labs    12/30/20 0032 12/31/20 0641  LABPROT 17.1* 17.4*  INR 1.4* 1.4*   ABG Recent Labs    12/31/20 0152 12/31/20 0340  PHART 7.174* 7.319*  HCO3 28.1* 24.0    Studies/Results: MR ABDOMEN MRCP WO CONTRAST  Result Date: 12/29/2020 CLINICAL DATA:  Biliary obstruction, choledocholithiasis, altered mental status, acute cholecystitis, status post percutaneous cholecystostomy EXAM: MRI ABDOMEN WITHOUT CONTRAST  (INCLUDING MRCP) TECHNIQUE: Multiplanar multisequence MR imaging of the abdomen was performed. Heavily T2-weighted images of the biliary and pancreatic ducts were obtained, and three-dimensional MRCP images were rendered by post processing. COMPARISON:  CT abdomen pelvis, 12/28/2020, percutaneous cholecystostomy, 12/29/2020 FINDINGS: Lower chest: Small  bilateral pleural effusions and associated atelectasis or consolidation. Cardiomegaly. Hepatobiliary: Mild hepatic steatosis. No mass or other parenchymal abnormality identified. The gallbladder is distended, with wall thickening and pericholecystic fluid, containing numerous tiny gallstones. Percutaneous cholecystostomy tube is in place, with formed pigtail in the gallbladder fundus. There is a small calculus in the distal common bile duct measuring 4 mm (series 8, image 27). No biliary ductal dilatation. Mild periportal edema. Pancreas: No mass, inflammatory changes, or other parenchymal abnormality identified. Spleen:  Splenomegaly, maximum coronal span 15.5 cm. Adrenals/Urinary Tract: No masses identified. No evidence of hydronephrosis. Stomach/Bowel: Visualized portions within the abdomen are unremarkable. Vascular/Lymphatic: No pathologically enlarged lymph nodes identified. No abdominal aortic aneurysm demonstrated. Other:  Small volume perihepatic ascites. Musculoskeletal: No suspicious bone lesions identified. IMPRESSION: 1. Percutaneous cholecystostomy tube is in place, with formed pigtail in the gallbladder fundus. 2. The gallbladder is distended, with wall thickening and pericholecystic fluid, containing numerous tiny gallstones. Findings are consistent with acute cholecystitis. 3. There is a 4 mm calculus in the distal common bile duct within the pancreatic head. No biliary ductal dilatation. 4. Mild hepatic steatosis. 5. Splenomegaly. 6. Small volume perihepatic ascites. 7. Small bilateral pleural effusions and associated atelectasis or consolidation. 8. Cardiomegaly. Electronically Signed   By: Eddie Candle M.D.   On: 12/29/2020 13:40   IR Perc Cholecystostomy  Result Date: 12/29/2020 INDICATION: 65 year old male with acute cholecystitis EXAM: ULTRASOUND AND FLUOROSCOPIC-GUIDED CHOLECYSTOSTOMY TUBE PLACEMENT COMPARISON:  CT Abdomen Pelvis, 12/28/2020. MEDICATIONS: The patient is currently admitted  to the hospital and on intravenous antibiotics. Antibiotics were  administered within an appropriate time frame prior to skin puncture. ANESTHESIA/SEDATION: Moderate (conscious) sedation was employed during this procedure. A total of Versed 1.5 mg and Fentanyl 75 mcg was administered intravenously. Moderate Sedation Time: 26 minutes. The patient's level of consciousness and vital signs were monitored continuously by radiology nursing throughout the procedure under my direct supervision. CONTRAST:  68m OMNIPAQUE IOHEXOL 300 MG/ML SOLN - administered into the gallbladder fossa. FLUOROSCOPY TIME:  1 minutes 0 seconds (9 mGy) COMPLICATIONS: None immediate. PROCEDURE: Informed written consent was obtained from the patient and/or patient's representative after a discussion of the risks, benefits and alternatives to treatment. Questions regarding the procedure were encouraged and answered. A timeout was performed prior to the initiation of the procedure. The right upper abdominal quadrant was prepped and draped in the usual sterile fashion, and a sterile drape was applied covering the operative field. Maximum barrier sterile technique with sterile gowns and gloves were used for the procedure. A timeout was performed prior to the initiation of the procedure. Local anesthesia was provided with 1% lidocaine with epinephrine. Ultrasound scanning of the right upper quadrant demonstrates a markedly dilated gallbladder. Utilizing a transhepatic approach, a 22 gauge needle was advanced into the gallbladder under direct ultrasound guidance. An ultrasound image was saved for documentation purposes. Appropriate intraluminal puncture was confirmed with the efflux of bile and advancement of an 0.018 wire into the gallbladder lumen. The needle was exchanged for an ALovellset. A small amount of contrast was injected to confirm appropriate intraluminal positioning. Over a [name] wire, a 1522-French Cook cholecystomy tube was advanced  into the gallbladder fossa, coiled and locked. Bile was aspirated and a small amount of contrast was injected as several post procedural spot radiographic images were obtained in various obliquities. The catheter was secured to the skin with suture, connected to a drainage bag and a dressing was placed. The patient tolerated the procedure well without immediate post procedural complication. IMPRESSION: Successful placement of a 10.2 French cholecystostomy tube, as above. JMichaelle Birks MD [facility] Electronically Signed   By: JMichaelle BirksMD   On: 12/29/2020 16:50   DG CHEST PORT 1 VIEW  Result Date: 12/31/2020 CLINICAL DATA:  Hypoxia. EXAM: PORTABLE CHEST 1 VIEW COMPARISON:  12/31/2020. FINDINGS: Cardiomegaly. No pulmonary venous congestion. Interim decrease opacification right hemithorax with interim visualization of the right upper lung. Findings consistent with partial resolution of large right pleural effusion. Large right pleural effusion remains. Underlying pulmonary disease cannot be excluded. No pneumothorax. IMPRESSION: 1. Findings consistent with interim partial resolution of large right pleural effusion. Large right pleural effusion remains. Underlying right lung disease cannot be excluded. 2.  Cardiomegaly.  No pulmonary venous congestion. Electronically Signed   By: TMarcello Moores Register   On: 12/31/2020 06:07   DG Chest Port 1 View  Result Date: 12/31/2020 CLINICAL DATA:  Acute respiratory distress. EXAM: PORTABLE CHEST 1 VIEW COMPARISON:  Chest radiograph dated 12/29/2020. FINDINGS: There is complete opacification of the right imaged thorax with slight shift of the mediastinum to the right of the midline suggestive of volume loss. This is new since the prior radiograph and may represent atelectasis, infiltrate, or combination of pleural effusion and associated atelectasis/infiltrate. Occlusion of the central airway is not excluded. Linear  density at the left lung base as seen on the prior radiograph. No pneumothorax. No acute osseous pathology. IMPRESSION: Complete opacification of the right imaged thorax with slight shift of the mediastinum to the right of the  midline suggestive of volume loss. Clinical correlation recommended. These results were called by telephone at the time of interpretation on 12/31/2020 at 2:10 am to nurse Riley Churches , who verbally acknowledged these results. Electronically Signed   By: Anner Crete M.D.   On: 12/31/2020 02:18    Anti-infectives: Anti-infectives (From admission, onward)    Start     Dose/Rate Route Frequency Ordered Stop   12/30/20 1300  metroNIDAZOLE (FLAGYL) IVPB 500 mg        500 mg 100 mL/hr over 60 Minutes Intravenous Every 12 hours 12/30/20 1203     12/29/20 2200  vancomycin (VANCOREADY) IVPB 1250 mg/250 mL  Status:  Discontinued        1,250 mg 166.7 mL/hr over 90 Minutes Intravenous Every 24 hours 12/29/20 0006 12/29/20 1831   12/29/20 2100  cefTRIAXone (ROCEPHIN) 2 g in sodium chloride 0.9 % 100 mL IVPB        2 g 200 mL/hr over 30 Minutes Intravenous Every 24 hours 12/29/20 1831     12/29/20 1000  ceFEPIme (MAXIPIME) 2 g in sodium chloride 0.9 % 100 mL IVPB  Status:  Discontinued        2 g 200 mL/hr over 30 Minutes Intravenous Every 12 hours 12/29/20 0200 12/29/20 1831   12/29/20 0600  piperacillin-tazobactam (ZOSYN) IVPB 3.375 g  Status:  Discontinued       See Hyperspace for full Linked Orders Report.   3.375 g 12.5 mL/hr over 240 Minutes Intravenous Every 8 hours 12/28/20 2244 12/29/20 0200   12/29/20 0200  metroNIDAZOLE (FLAGYL) IVPB 500 mg  Status:  Discontinued        500 mg 100 mL/hr over 60 Minutes Intravenous Every 8 hours 12/29/20 0154 12/29/20 1831   12/29/20 0000  vancomycin (VANCOREADY) IVPB 1250 mg/250 mL  Status:  Discontinued        1,250 mg 166.7 mL/hr over 90 Minutes Intravenous Every 24 hours 12/28/20 2246 12/29/20 0006   12/28/20 2245   vancomycin (VANCOCIN) 2,500 mg in sodium chloride 0.9 % 500 mL IVPB        2,500 mg 250 mL/hr over 120 Minutes Intravenous  Once 12/28/20 2244 12/29/20 0205   12/28/20 2245  piperacillin-tazobactam (ZOSYN) IVPB 3.375 g       See Hyperspace for full Linked Orders Report.   3.375 g 100 mL/hr over 30 Minutes Intravenous  Once 12/28/20 2244 12/28/20 2319       Assessment/Plan: Cholecystitis/choledocholithiasis  -IR perc chole drain on 8/2 -S/P ERCP and stone removal by Dr. Fuller Plan 8/3 -diet as able (on BiPAP now) -we are available as needed  FEN - NPO/IVFs ID - Rocephin VTE - heparin  A fib Chronic respiratory failure DM HTN H/O MI H/O CVA H/O DVT OSA PVD   LOS: 2 days    Georganna Skeans, MD, MPH, FACS Trauma & General Surgery Use [email or web address] to contact on call provider  12/31/2020

## 2020-12-31 NOTE — Progress Notes (Signed)
Patient remains on AVAPS with VT set at 600, EPAP 10,MAx pressure=35cm

## 2020-12-31 NOTE — Progress Notes (Addendum)
NAME:  Fady Hales, MRN:  [id number], DOB:  [birth year], LOS: 2 ADMISSION DATE:  12/28/2020, CONSULTATION DATE:  12/31/20 REFERRING MD:  TRH, CHIEF COMPLAINT: Encephalopathy  History of Present Illness:  65 year old who lives at SNF with multiple chronic medical issues include hyperlipidemia, hypertension, OSA on CPAP, A. fib on Coumadin, reported COPD although no PFTs to verify this admitted with ascending cholangitis.  Placed on antibiotics.  ERCP 8/3.  Decompensation early morning 8/4 with hypoxemia and A. fib with RVR.  Chest x-ray revealed right lung atelectasis with volume loss and mediastinal shift to the right.  More hypoxemic.  ABG reveals significant hypoxemia and mild hypercarbia.  Placed on AVAPS with some initial improvement in mental status.  Pertinent  Medical History  Hyperlipidemia, diabetes, hypertension, atrial fibrillation on Coumadin  Significant Hospital Events: Including procedures, antibiotic start and stop dates in addition to other pertinent events   8/1 presents to ED, septic, biliary source,, surgery eval recommended MRCP, this revealed cholangitis 8/2 patient mid to the hospital, [place] 8/3 ERCP 8/4 hypoxemia, A. fib with RVR, chest x-ray reveals right lung atelectasis with volume loss, hypercarbic, hypotensive prompting PCCM evaluation 12/31/2020 recommendation transfer to intensive care unit unless palliation is goal.   Objective   Blood pressure 122/69, pulse (!) 122, temperature 98.3 F (36.8 C), temperature source Axillary, resp. rate (!) 24, height 6' (1.829 m), weight 131.5 kg, SpO2 95 %.    FiO2 (%):  [100 %] 100 %   Intake/Output Summary (Last 24 hours) at 12/31/2020 0942 Last data filed at 12/31/2020 [id number] Gross per 24 hour  Intake 505 ml  Output 1345 ml  Net -840 ml   Filed Weights   12/28/20 2232 12/30/20 0355  Weight: 130.6 kg 131.5 kg    Examination: General: Ill-appearing 65 year old male currently off HEENT: Fullface mask is in place.  No  JVD is appreciated Neuro: Poorly responsive nods CV: Heart sounds are regular with a ventricular rate of 135 PULM: On noninvasive mechanical ventilatory support AVAPS tidal volume 600 rate of 20 pulse 3 FiO2 is 90% with sats of 92% Decreased lung sounds throughout   GI: Tender right flank port drain in place Extremities: warm/dry, 1-2+ edema chronic ischemic changes lower extremity Skin: no rashes or lesions  Tx to ICU  Resolved Hospital Problem list     Assessment & Plan:  Acute on chronic hypoxemic and hypercarbic respiratory failure: In the setting of left-sided profound weakness, poor cough mechanics and likely mucous plug leading to complete atelectasis of right lung on chest x-ray.  Underlying OSA.  ABG with significant hypoxemia and hypercarbia.       Currently on AVAPS ventilation with a tidal volume 600 backup rate of 20 spontaneous rate overbreathing 3 and FiO2 of 90% with O2 saturations of 92%.  His heart rate remains elevated at 135 despite being on amiodarone drip.  Continue current mechanical ventilatory support Low threshold for intubation transfer to intensive care unit 8/4 Pulmonary toilet [facility] physician meeting with family today for goals of care.   Toxic metabolic encephalopathy most likely multifactorial Will need to transfer to intensive care unit if more aggressive care is to be considered   Atrial fibrillation with ventricular response currently on amiodarone Heart rate is 135    Hypotension Currently resolved   Best Practice (right click and "Reselect all SmartList Selections" daily)   Per primary  Labs   CBC: Recent Labs  Lab 12/28/20 2141 12/29/20 0340 12/30/20 0032 12/31/20 0641  WBC 20.4* 16.7*  9.3 13.1*  NEUTROABS 17.7*  --  7.4  --   HGB 11.9* 11.3* 11.3* 9.9*  HCT 38.6* 35.5* 36.2* 33.9*  MCV 86.2 86.0 87.2 90.9  PLT 198 165 142* 142*    Basic Metabolic Panel: Recent Labs  Lab 12/28/20 2141 12/29/20 0340 12/30/20 0032  12/31/20 0641  NA 131* 135 138 138  K 3.8 3.5 3.4* 4.1  CL 89* 94* 96* 99  CO2 '27 30 29 26  '$ GLUCOSE 248* 207* 181* 238*  BUN 32* 28* 28* 35*  CREATININE 1.92* 1.77* 1.71* 2.13*  CALCIUM 9.2 8.8* 9.1 8.8*  MG  --   --  1.4* 1.8   GFR: Estimated Creatinine Clearance: 47.9 mL/min (A) (by C-G formula based on SCr of 2.13 mg/dL (H)). Recent Labs  Lab 12/28/20 2141 12/28/20 2145 12/28/20 2230 12/29/20 0340 12/30/20 0032 12/31/20 0641  WBC 20.4*  --   --  16.7* 9.3 13.1*  LATICACIDVEN  --  2.2* 2.5* 1.4  --   --     Liver Function Tests: Recent Labs  Lab 12/28/20 2141 12/29/20 0340 12/30/20 0032 12/31/20 0641  AST 54* 48* 54* 67*  ALT '24 23 26 28  '$ ALKPHOS 267* 244* 200* 212*  BILITOT 5.2* 4.6* 4.4* 4.7*  PROT 7.6 6.7 6.7 6.2*  ALBUMIN 2.0* 1.7* 1.8* 1.5*   No results for input(s): LIPASE, AMYLASE in the last 168 hours. Recent Labs  Lab 12/28/20 2200  AMMONIA 16    ABG    Component Value Date/Time   PHART 7.319 (L) 12/31/2020 0340   PCO2ART 48.0 12/31/2020 0340   PO2ART 261 (H) 12/31/2020 0340   HCO3 24.0 12/31/2020 0340   ACIDBASEDEF 1.3 12/31/2020 0340   O2SAT 99.3 12/31/2020 0340     Coagulation Profile: Recent Labs  Lab 12/28/20 2141 12/29/20 0340 12/30/20 0032 12/31/20 0641  INR 1.3* 1.3* 1.4* 1.4*    Cardiac Enzymes: No results for input(s): CKTOTAL, CKMB, CKMBINDEX, TROPONINI in the last 168 hours.  HbA1C: Hemoglobin A1C  Date/Time Value Ref Range Status  06/07/2019 12:00 AM 8.4  Final  11/12/2018 12:00 AM 8.0  Final   Hgb A1c MFr Bld  Date/Time Value Ref Range Status  12/29/2020 03:40 AM 8.6 (H) 4.8 - 5.6 % Final    Comment:    (NOTE) Pre diabetes:          5.7%-6.4%  Diabetes:              >6.4%  Glycemic control for   <7.0% adults with diabetes   03/26/2020 11:37 AM 7.2 (H) 4.8 - 5.6 % Final    Comment:             Prediabetes: 5.7 - 6.4          Diabetes: >6.4          Glycemic control for adults with diabetes: <7.0      CBG: Recent Labs  Lab 12/30/20 1801 12/30/20 1935 12/31/20 0025 12/31/20 0150 12/31/20 0615  GLUCAP 159* 180* 213* 206* 53*      Past Medical History:  He,  has a past medical history of Absence epileptic syndrome, not intractable, without status epilepticus ([place]) (07/23/2019), Acute renal failure superimposed on stage 3 chronic kidney disease ([place]) (07/23/2019), Atherosclerotic heart disease (07/23/2019), Atrial fibrillation with RVR ([place]) (12/14/2019), Benign enlargement of prostate (07/23/2019), Body mass index 50.0-59.9, adult ([place]) (07/23/2019), Cellulitis of leg (12/14/2019), Chronic respiratory failure with hypoxia ([place]) (07/23/2019), Chronic venous hypertension (idiopathic) with ulcer of left lower  extremity ([place]) (07/23/2019), COPD (chronic obstructive pulmonary disease) ([place]) (07/23/2019), Diabetic glomerulopathy ([place]) (10/15/2019), Diabetic neuropathy ([place]) (07/23/2019), 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[place]) (10/15/2019), Epilepsy, grand mal ([place]), Essential hypertension (01/22/2018), Frozen shoulder, Gout attack (10/06/2019), H/O acute myocardial infarction (01/22/2018), H/O: stroke (01/22/2018), History of DVT (deep vein thrombosis) (01/22/2018), Incontinence without sensory awareness (07/23/2019), Long term (current) use of anticoagulants (07/23/2019), Lumbago, Lymphedema of both lower extremities (01/22/2018), Mixed hyperlipidemia (07/23/2019), Morbid obesity ([place]) (07/23/2019), Obstructive sleep apnea (07/23/2019), Paroxysmal atrial fibrillation ([place]) (01/07/2020), Peripheral vascular disease ([place]), Peripheral vascular disease, unspecified ([place]) (07/12/2011), Secondary hypercoagulable state ([place]) (01/07/2020), Shoulder bursitis (07/25/2019), Stroke ([place]), and Urinary incontinence (07/25/2019).   Surgical History:   Past Surgical History:  Procedure Laterality Date   CORONARY STENT PLACEMENT     IR PERC CHOLECYSTOSTOMY  12/29/2020     Critical care time: 27 min    Richardson Landry Minor ACNP Acute Care Nurse  Practitioner [place] Please consult Amion 12/31/2020, 9:42 AM   I agree with the Advanced Practitioner's note, impression, and recommendations as outlined. I have taken an independent interval history, reviewed the chart and examined the patient.  My medical decision making is as follows:   Subjective: 65 yo man with multiple chronic medical conditions including HDL, HTN, COPD, A fib OSA. Poor functional baseline in nursing home. Here with sepsis from ascending cholangitis. Had percutaneous biliary drain placed, but had worsened respiratory status overnight - hypoxemic and hyeprcapnic. Large right sided mucus plug. Placed on bipap. Now with AF with RVR. Transferred to ICU for further care.   Objective: Vitals:   12/31/20 1245 12/31/20 1300  BP:  106/71  Pulse: (!) 103 98  Resp: (!) 25 (!) 25  Temp:    SpO2: 95% 92%    Chronically ill appearing On bipap Awake, alert, follows commands Irregularly irregular and tachycardic No edema Diffusely weak.  Labs/Imaging: Chest xray shows mild improvement of the right sided mucus plug.   Assessment and Plan:  Acute hypoxemic and hypercapnic respiratory failure Atrial Fibrillation with RVR Klebsiella bacteremia and ascending cholangitis.  AKI - likely pre-renal from hypoperfusion from sepsis Seizure disorder, schizoaffective disordre - on VPA Obesity Body mass index is 39.32 kg/m. History of CVA   Continue amiodarone gtt for AF with rvr, prn metoprolol Resume heparin gtt for AF with RVR Narrow abx to ceftriaxone based on reviewed culture data Monitor uop Monitor for impending intubation. Will try giving him a break on bipap and we'll see how he does. Discussed with family who is in agreement with full code measures. Poor cough mechanics means he will have trouble clearing this plug on his own. Continue nebs   The patient is critically ill due to respiratory failure with multiple organ systems failure and  requires high complexity decision making for assessment and support, frequent evaluation and titration of therapies, application of advanced monitoring technologies and extensive interpretation of multiple databases.   Critical Care Time devoted to patient care services described in this note is 40 minutes. This time reflects time of care of this [employer] . This critical care time does not reflect separately billable procedures or procedure time, teaching time and supervisory time of PA/NP/Med student/Med Resident etc but could involve care discussion time.  Spero Geralds [place] [facility] 12/31/2020 1:30 PM  Pager: see AMION  If no response to pager, please call critical care on call (see AMION) until 7pm After 7:00 pm call [place]

## 2020-12-31 NOTE — Significant Event (Addendum)
Rapid Response Event Note   Reason for Call : Afib with RVR and acute hypoxia Initial Focused Assessment:  Nursing staff notified me of pt with an acute oxygen desaturation and Afib with RVR. Upon arrival, Matthew Gutierrez is arousable only to noxious stimuli and currently nonverbal. He does speak limited words at baseline. BBS significantly diminished on Right with rhonchi on the left. Pt has labored breathing pattern using accessory muscles and poor cough effort. Skin is pale, mottled from waist down, warm and diaphoretic. Pt placed on NRB mask at 15L until BIPAP arrives. CBG 206. PCXR, EKG, ABG, and BIPAP ordered. Dr. Clearence Ped notified by primary RN. Dr. Silas Flood was on department and came to the bedside because the patient is in distress. Pt placed on BIPAP. Most likely will transfer to ICU.   0200-98.6 F, HR 143 Afib, 113/96, RR 23 with sats 82% on NRB mask at 15L.    Interventions:  -Stat EKG (Afib with RVR) -Stat PCXR -Stat ABG (7.17/79.5/56.6/28) -Bipap (TV 600/epap 10/rate 20/100%) Ve 10.7  Plan of care: -recheck ABG in an hour or so  (7.31/48/261/24) -recheck PCXR    MD Notified: Dr. Clearence Ped per nursing staff Call Time: [phone] Arrival Time: 0140 End Time: [place]  Matthew Done, RN

## 2020-12-31 NOTE — Consult Note (Signed)
NAME:  Joaovictor Lanehart, MRN:  [id number], DOB:  1955-07-07, LOS: 2 ADMISSION DATE:  12/28/2020, CONSULTATION DATE:  12/31/20 REFERRING MD:  TRH, CHIEF COMPLAINT: Encephalopathy  History of Present Illness:  65 year old who lives at SNF with multiple chronic medical issues include hyperlipidemia, hypertension, OSA on CPAP, A. fib on Coumadin, reported COPD although no PFTs to verify this admitted with ascending cholangitis.  Placed on antibiotics.  ERCP 8/3.  Decompensation early morning 8/4 with hypoxemia and A. fib with RVR.  Chest x-ray revealed right lung atelectasis with volume loss and mediastinal shift to the right.  More hypoxemic.  ABG reveals significant hypoxemia and mild hypercarbia.  Placed on AVAPS with some initial improvement in mental status.  Pertinent  Medical History  Hyperlipidemia, diabetes, hypertension, atrial fibrillation on Coumadin  Significant Hospital Events: Including procedures, antibiotic start and stop dates in addition to other pertinent events   8/1 presents to ED, septic, biliary source,, surgery eval recommended MRCP, this revealed cholangitis 8/2 patient mid to the hospital, [place] 8/3 ERCP 8/4 hypoxemia, A. fib with RVR, chest x-ray reveals right lung atelectasis with volume loss, hypercarbic, hypotensive prompting PCCM evaluation   Objective   Blood pressure (!) 113/96, pulse (!) 143, temperature 98.6 F (37 C), temperature source Axillary, resp. rate (!) 23, height 6' (1.829 m), weight 131.5 kg, SpO2 99 %.    FiO2 (%):  [100 %] 100 %   Intake/Output Summary (Last 24 hours) at 12/31/2020 0253 Last data filed at 12/30/2020 2326 Gross per 24 hour  Intake 1321.26 ml  Output 1220 ml  Net 101.26 ml   Filed Weights   12/28/20 2232 12/30/20 0355  Weight: 130.6 kg 131.5 kg    Examination: General: Acute and chronically ill-appearing, lying in bed, very weak and Pulmonary: Diminished to no sounds on the right, coarse on the left, normal work of  breathing Cardiovascular: Irregularly irregular, tachycardic MSK: PRAFO boots on, no synovitis Skin: Dry, flaky, increased turgor   Resolved Hospital Problem list     Assessment & Plan:  Acute on chronic hypoxemic and hypercarbic respiratory failure: In the setting of left-sided profound weakness, poor cough mechanics and likely mucous plug leading to complete atelectasis of right lung on chest x-ray.  Underlying OSA.  ABG with significant hypoxemia and hypercarbia. --Placed on AVAPS, tidal volume 600, backup rate 20, seem to be slightly more responsive after initiation of this --If mental status not improving, recommend repeat ABG, consider mechanical ventilation if not improving --Pulmonary toilet, airway clearance  Toxic metabolic encephalopathy: Multifactorial related to mild hypercarbia, likely component of delirium, potential sepsis.  Atrial fibrillation with RVR: Suspect triggered by lung collapse, hypoxemia. --Metoprolol, amiodarone per primary team with which I agree  Hypotension: In the setting of AV nodal blockade and amiodarone likely with underlying hypovolemia given poor p.o. intake.  Possible distributive issue given biliary sepsis. --IV fluid bolus  Patient status is tenuous.  Will attempt to stabilize in stepdown.  If unable, recommend admission to ICU.  Given patient's physical condition, strongly recommend readdressing goals of care, consider palliative care consult in the future.  Best Practice (right click and "Reselect all SmartList Selections" daily)   Per primary  Labs   CBC: Recent Labs  Lab 12/28/20 2141 12/29/20 0340 12/30/20 0032  WBC 20.4* 16.7* 9.3  NEUTROABS 17.7*  --  7.4  HGB 11.9* 11.3* 11.3*  HCT 38.6* 35.5* 36.2*  MCV 86.2 86.0 87.2  PLT 198 165 142*    Basic Metabolic Panel: Recent  Labs  Lab 12/28/20 2141 12/29/20 0340 12/30/20 0032  NA 131* 135 138  K 3.8 3.5 3.4*  CL 89* 94* 96*  CO2 '27 30 29  '$ GLUCOSE 248* 207* 181*  BUN  32* 28* 28*  CREATININE 1.92* 1.77* 1.71*  CALCIUM 9.2 8.8* 9.1  MG  --   --  1.4*   GFR: Estimated Creatinine Clearance: 59.6 mL/min (A) (by C-G formula based on SCr of 1.71 mg/dL (H)). Recent Labs  Lab 12/28/20 2141 12/28/20 2145 12/28/20 2230 12/29/20 0340 12/30/20 0032  WBC 20.4*  --   --  16.7* 9.3  LATICACIDVEN  --  2.2* 2.5* 1.4  --     Liver Function Tests: Recent Labs  Lab 12/28/20 2141 12/29/20 0340 12/30/20 0032  AST 54* 48* 54*  ALT '24 23 26  '$ ALKPHOS 267* 244* 200*  BILITOT 5.2* 4.6* 4.4*  PROT 7.6 6.7 6.7  ALBUMIN 2.0* 1.7* 1.8*   No results for input(s): LIPASE, AMYLASE in the last 168 hours. Recent Labs  Lab 12/28/20 2200  AMMONIA 16    ABG    Component Value Date/Time   PHART 7.174 (LL) 12/31/2020 0152   PCO2ART 79.5 (HH) 12/31/2020 0152   PO2ART 56.6 (L) 12/31/2020 0152   HCO3 28.1 (H) 12/31/2020 0152   O2SAT 79.9 12/31/2020 0152     Coagulation Profile: Recent Labs  Lab 12/28/20 2141 12/29/20 0340 12/30/20 0032  INR 1.3* 1.3* 1.4*    Cardiac Enzymes: No results for input(s): CKTOTAL, CKMB, CKMBINDEX, TROPONINI in the last 168 hours.  HbA1C: Hemoglobin A1C  Date/Time Value Ref Range Status  06/07/2019 12:00 AM 8.4  Final  11/12/2018 12:00 AM 8.0  Final   Hgb A1c MFr Bld  Date/Time Value Ref Range Status  12/29/2020 03:40 AM 8.6 (H) 4.8 - 5.6 % Final    Comment:    (NOTE) Pre diabetes:          5.7%-6.4%  Diabetes:              >6.4%  Glycemic control for   <7.0% adults with diabetes   03/26/2020 11:37 AM 7.2 (H) 4.8 - 5.6 % Final    Comment:             Prediabetes: 5.7 - 6.4          Diabetes: >6.4          Glycemic control for adults with diabetes: <7.0     CBG: Recent Labs  Lab 12/30/20 0631 12/30/20 1124 12/30/20 1801 12/31/20 0025 12/31/20 0150  GLUCAP 173* 153* 159* 213* 206*    Review of Systems:   Unobtainable due to patient factors  Past Medical History:  He,  has a past medical history of  Absence epileptic syndrome, not intractable, without status epilepticus ([place]) (07/23/2019), Acute renal failure superimposed on stage 3 chronic kidney disease ([place]) (07/23/2019), Atherosclerotic heart disease (07/23/2019), Atrial fibrillation with RVR ([employer]) (12/14/2019), Benign enlargement of prostate (07/23/2019), Body mass index 50.0-59.9, adult ([place]) (07/23/2019), Cellulitis of leg (12/14/2019), Chronic respiratory failure with hypoxia ([place]) (07/23/2019), Chronic venous hypertension (idiopathic) with ulcer of left lower extremity ([place]) (07/23/2019), COPD (chronic obstructive pulmonary disease) ([place]) (07/23/2019), Diabetic glomerulopathy ([place]) (10/15/2019), Diabetic neuropathy ([place]) (07/23/2019), 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[place]) (10/15/2019), Epilepsy, grand mal ([place]), Essential hypertension (01/22/2018), Frozen shoulder, Gout attack (10/06/2019), H/O acute myocardial infarction (01/22/2018), H/O: stroke (01/22/2018), History of DVT (deep vein thrombosis) (01/22/2018), Incontinence without sensory awareness (07/23/2019), Long term (current) use of anticoagulants (07/23/2019), Lumbago, Lymphedema of both lower  extremities (01/22/2018), Mixed hyperlipidemia (07/23/2019), Morbid obesity ([place]) (07/23/2019), Obstructive sleep apnea (07/23/2019), Paroxysmal atrial fibrillation ([place]) (01/07/2020), Peripheral vascular disease ([place]), Peripheral vascular disease, unspecified ([place]) (07/12/2011), Secondary hypercoagulable state ([place]) (01/07/2020), Shoulder bursitis (07/25/2019), Stroke [facility]), and Urinary incontinence (07/25/2019).   Surgical History:   Past Surgical History:  Procedure Laterality Date   CORONARY STENT PLACEMENT     IR PERC CHOLECYSTOSTOMY  12/29/2020     Social History:   reports that he quit smoking about 11 years ago. He has never used smokeless tobacco. He reports previous alcohol use. He reports that he does not use drugs.   Family History:  His family history includes Alcohol abuse in an other family member; Cancer in his  mother and another family member; Heart disease in an other family member; Hyperlipidemia in an other family member; Hypertension in an other family member; Stroke in his father and another family member.   Allergies Allergies  Allergen Reactions   Codeine Other (See Comments)    Unknown Reaction     Home Medications  Prior to Admission medications   Medication Sig Start Date End Date Taking? Authorizing Provider  albuterol (PROVENTIL) (2.5 MG/3ML) 0.083% nebulizer solution Inhale 3 mLs (2.5 mg total) into the lungs every 6 (six) hours as needed for wheezing or shortness of breath. 03/18/20  Yes Lillard Anes, MD  aspirin 325 MG tablet Take 325 mg by mouth daily.   Yes [provider]  atorvastatin (LIPITOR) 10 MG tablet Take 10 mg by mouth daily.   Yes [provider]  Cholecalciferol (VITAMIN D3) 1.25 MG (50000 UT) TABS Take 50,000 Units by mouth 2 (two) times a week. Every Wed and Sat   Yes [provider]  divalproex (DEPAKOTE) 125 MG DR tablet Take 125 mg by mouth 2 (two) times daily.   Yes [provider]  divalproex (DEPAKOTE) 500 MG DR tablet Take 500 mg by mouth 2 (two) times daily.   Yes [provider]  Dulaglutide (TRULICITY) [id number] [id number] SOPN Inject 0.75 mg into the skin every Tuesday.   Yes [provider]  DULoxetine (CYMBALTA) 60 MG capsule Take 60 mg by mouth daily.   Yes [provider]  EPINEPHrine 0.3 mg/0.3 mL IJ SOAJ injection Inject 0.3 mg into the muscle as needed for anaphylaxis. 03/18/20  Yes Lillard Anes, MD  fluticasone [facility]) 50 MCG/ACT nasal spray Place 1 spray into both nostrils daily. Patient taking differently: Place 1 spray into both nostrils daily as needed for allergies. 10/30/19  Yes Lillard Anes, MD  gabapentin (NEURONTIN) 300 MG capsule Take 1 capsule (300 mg total) by mouth at bedtime. 11/18/19  Yes Lillard Anes, MD  glucose blood test strip 1 each by  Other route as needed. Use as instructed 03/17/20  Yes Lillard Anes, MD  isosorbide mononitrate (IMDUR) 120 MG 24 hr tablet Take 1 tablet (120 mg total) by mouth daily. 01/16/20  Yes Lillard Anes, MD  metoprolol succinate (TOPROL-XL) 25 MG 24 hr tablet Take 25 mg by mouth daily.   Yes [provider]  polyethylene glycol powder (GLYCOLAX/MIRALAX) 17 GM/SCOOP powder Take 1 Container by mouth every morning.   Yes [provider]  potassium chloride (KLOR-CON) 10 MEQ tablet Take 10 mEq by mouth daily.   Yes [provider]  risperiDONE (RISPERDAL) 1 MG tablet Take 1 mg by mouth 2 (two) times daily.   Yes [provider]  torsemide (DEMADEX) 20 MG tablet Take 20  mg by mouth daily.   Yes [provider]  allopurinol (ZYLOPRIM) 300 MG tablet Take 1 tablet (300 mg total) by mouth daily. Patient not taking: No sig reported 03/18/20   Lillard Anes, MD  carvedilol (COREG) 3.125 MG tablet Take 1 tablet (3.125 mg total) by mouth 2 (two) times daily with a meal. Patient not taking: No sig reported 02/05/20   Lillard Anes, MD  diltiazem (CARDIZEM) 60 MG tablet Take 1 tablet (60 mg total) by mouth 2 (two) times daily. Patient not taking: No sig reported 02/05/20   Lillard Anes, MD  metFORMIN (GLUCOPHAGE) 1000 MG tablet Take 1,000 mg by mouth 2 (two) times daily with a meal. Patient not taking: No sig reported    [provider]  phenytoin (DILANTIN) 300 MG ER capsule Take 1 capsule (300 mg total) by mouth 2 (two) times daily. Patient currently on 100 mg 2 times a day Patient not taking: No sig reported 01/16/20   Lillard Anes, MD  pravastatin (PRAVACHOL) 40 MG tablet Take 1 tablet (40 mg total) by mouth daily. Patient not taking: No sig reported 11/18/19   Lillard Anes, MD  TOVIAZ 4 MG TB24 tablet TAKE 1 TABLET EVERY DAY Patient not taking: No sig reported 03/25/20   Lillard Anes, MD   triamcinolone cream (KENALOG) 0.1 % APPLY TO THE AFFECTED AREA(S) TWICE DAILY AS NEEDED Patient not taking: No sig reported 11/16/19   Lillard Anes, MD  warfarin (COUMADIN) 2 MG tablet Take 1 tablet (2 mg total) by mouth 3 (three) times a week. Monday, Wednesday and Friday Patient not taking: No sig reported 03/18/20   Lillard Anes, MD  warfarin (COUMADIN) 4 MG tablet Take 4 mg by mouth daily. Saturday, Sunday,tuesday and thursday Patient not taking: No sig reported    [provider]     Critical care time:      CRITICAL CARE Performed by: Lanier Clam   Total critical care time: 35 minutes  Critical care time was exclusive of separately billable procedures and treating other patients.  Critical care was necessary to treat or prevent imminent or life-threatening deterioration.  Critical care was time spent personally by me on the following activities: development of treatment plan with patient and/or surrogate as well as nursing, discussions with consultants, evaluation of patient's response to treatment, examination of patient, obtaining history from patient or surrogate, ordering and performing treatments and interventions, ordering and review of laboratory studies, ordering and review of radiographic studies, pulse oximetry and re-evaluation of patient's condition.

## 2020-12-31 NOTE — Progress Notes (Signed)
At approximately 1:20 am, while rounding on patient, I observed patient oxygen saturation begin to slowly drop low 90s to mid 80s over a couple of minutes. After several interventions including increasing oxygen and applying venti mask, saturation continue to deteriorate.RRT, physician and respiratory were notified. Interventions continued through the duration of the shift.

## 2020-12-31 NOTE — Progress Notes (Signed)
Daily Rounding Note  12/31/2020, 10:13 AM  LOS: 2 days   SUBJECTIVE:   Chief complaint:    Choledocholithiasis, cholecystitis.  Rapid response and CCM involvement this morning due to A. Fib RVR, hypoxia.  Now on BiPAP and awaiting transfer to ICU. Denies abdominal pain, denies nausea.  OBJECTIVE:         Vital signs in last 24 hours:    Temp:  [96.7 F (35.9 C)-100.1 F (37.8 C)] 97.5 F (36.4 C) (08/04 0948) Pulse Rate:  [32-143] 130 (08/04 0948) Resp:  [14-27] 25 (08/04 0948) BP: (80-171)/(51-99) 107/74 (08/04 0948) SpO2:  [83 %-100 %] 98 % (08/04 0948) FiO2 (%):  [100 %] 100 % (08/04 0300) Last BM Date: 12/30/20 Filed Weights   12/28/20 2232 12/30/20 0355  Weight: 130.6 kg 131.5 kg   General: BiPAP in place.  Looks pale and ill.  Alert.  Not uncomfortable. Heart: A. fib, rate 145. Chest: BiPAP in place.  Somewhat labored breathing but even.  No cough. Abdomen: Large, obese, soft, nontender.  Active bowel sounds.  Chole drain placed very laterally on the right upper abdomen.   Extremities: + Edema. Neuro/Psych: Appropriate.  Follows commands.  Able to nod appropriately to yes/no questions.  Intake/Output from previous day: 08/03 0701 - 08/04 0700 In: 505 [I.V.:505] Out: 900 [Urine:900]  Intake/Output this shift: Total I/O In: 5 [Other:5] Out: 445 [Urine:400; Drains:45]  Lab Results: Recent Labs    12/29/20 0340 12/30/20 0032 12/31/20 0641  WBC 16.7* 9.3 13.1*  HGB 11.3* 11.3* 9.9*  HCT 35.5* 36.2* 33.9*  PLT 165 142* 142*   BMET Recent Labs    12/29/20 0340 12/30/20 0032 12/31/20 0641  NA 135 138 138  K 3.5 3.4* 4.1  CL 94* 96* 99  CO2 '30 29 26  '$ GLUCOSE 207* 181* 238*  BUN 28* 28* 35*  CREATININE 1.77* 1.71* 2.13*  CALCIUM 8.8* 9.1 8.8*   LFT Recent Labs    12/29/20 0340 12/30/20 0032 12/31/20 0641  PROT 6.7 6.7 6.2*  ALBUMIN 1.7* 1.8* 1.5*  AST 48* 54* 67*  ALT '23 26 28   '$ ALKPHOS 244* 200* 212*  BILITOT 4.6* 4.4* 4.7*  BILIDIR 2.8*  --   --   IBILI 1.8*  --   --    PT/INR Recent Labs    12/30/20 0032 12/31/20 0641  LABPROT 17.1* 17.4*  INR 1.4* 1.4*   Hepatitis Panel Recent Labs    12/28/20 2236  HEPBSAG NON REACTIVE  HCVAB NON REACTIVE  HEPAIGM NON REACTIVE  HEPBIGM NON REACTIVE    Studies/Results: MR ABDOMEN MRCP WO CONTRAST  Result Date: 12/29/2020 CLINICAL DATA:  Biliary obstruction, choledocholithiasis, altered mental status, acute cholecystitis, status post percutaneous cholecystostomy EXAM: MRI ABDOMEN WITHOUT CONTRAST  (INCLUDING MRCP) TECHNIQUE: Multiplanar multisequence MR imaging of the abdomen was performed. Heavily T2-weighted images of the biliary and pancreatic ducts were obtained, and three-dimensional MRCP images were rendered by post processing. COMPARISON:  CT abdomen pelvis, 12/28/2020, percutaneous cholecystostomy, 12/29/2020 FINDINGS: Lower chest: Small bilateral pleural effusions and associated atelectasis or consolidation. Cardiomegaly. Hepatobiliary: Mild hepatic steatosis. No mass or other parenchymal abnormality identified. The gallbladder is distended, with wall thickening and pericholecystic fluid, containing numerous tiny gallstones. Percutaneous cholecystostomy tube is in place, with formed pigtail in the gallbladder fundus. There is a small calculus in the distal common bile duct measuring 4 mm (series 8, image 27). No biliary ductal dilatation. Mild periportal edema. Pancreas: No mass,  inflammatory changes, or other parenchymal abnormality identified. Spleen:  Splenomegaly, maximum coronal span 15.5 cm. Adrenals/Urinary Tract: No masses identified. No evidence of hydronephrosis. Stomach/Bowel: Visualized portions within the abdomen are unremarkable. Vascular/Lymphatic: No pathologically enlarged lymph nodes identified. No abdominal aortic aneurysm demonstrated. Other:  Small volume perihepatic ascites. Musculoskeletal: No  suspicious bone lesions identified. IMPRESSION: 1. Percutaneous cholecystostomy tube is in place, with formed pigtail in the gallbladder fundus. 2. The gallbladder is distended, with wall thickening and pericholecystic fluid, containing numerous tiny gallstones. Findings are consistent with acute cholecystitis. 3. There is a 4 mm calculus in the distal common bile duct within the pancreatic head. No biliary ductal dilatation. 4. Mild hepatic steatosis. 5. Splenomegaly. 6. Small volume perihepatic ascites. 7. Small bilateral pleural effusions and associated atelectasis or consolidation. 8. Cardiomegaly. Electronically Signed   By: Eddie Candle M.D.   On: 12/29/2020 13:40   IR Perc Cholecystostomy  Result Date: 12/29/2020 INDICATION: 65 year old male with acute cholecystitis EXAM: ULTRASOUND AND FLUOROSCOPIC-GUIDED CHOLECYSTOSTOMY TUBE PLACEMENT COMPARISON:  CT Abdomen Pelvis, 12/28/2020. MEDICATIONS: The patient is currently admitted to the hospital and on intravenous antibiotics. Antibiotics were administered within an appropriate time frame prior to skin puncture. ANESTHESIA/SEDATION: Moderate (conscious) sedation was employed during this procedure. A total of Versed 1.5 mg and Fentanyl 75 mcg was administered intravenously. Moderate Sedation Time: 26 minutes. The patient's level of consciousness and vital signs were monitored continuously by radiology nursing throughout the procedure under my direct supervision. CONTRAST:  30m OMNIPAQUE IOHEXOL 300 MG/ML SOLN - administered into the gallbladder fossa. FLUOROSCOPY TIME:  1 minutes 0 seconds (9 mGy) COMPLICATIONS: None immediate. PROCEDURE: Informed written consent was obtained from the patient and/or patient's representative after a discussion of the risks, benefits and alternatives to treatment. Questions regarding the procedure were encouraged and answered. A timeout was performed prior to the initiation of the procedure. The right upper abdominal quadrant  was prepped and draped in the usual sterile fashion, and a sterile drape was applied covering the operative field. Maximum barrier sterile technique with sterile gowns and gloves were used for the procedure. A timeout was performed prior to the initiation of the procedure. Local anesthesia was provided with 1% lidocaine with epinephrine. Ultrasound scanning of the right upper quadrant demonstrates a markedly dilated gallbladder. Utilizing a transhepatic approach, a 22 gauge needle was advanced into the gallbladder under direct ultrasound guidance. An ultrasound image was saved for documentation purposes. Appropriate intraluminal puncture was confirmed with the efflux of bile and advancement of an 0.018 wire into the gallbladder lumen. The needle was exchanged for an APhelanset. A small amount of contrast was injected to confirm appropriate intraluminal positioning. Over a [name] wire, a 1162-French Cook cholecystomy tube was advanced into the gallbladder fossa, coiled and locked. Bile was aspirated and a small amount of contrast was injected as several post procedural spot radiographic images were obtained in various obliquities. The catheter was secured to the skin with suture, connected to a drainage bag and a dressing was placed. The patient tolerated the procedure well without immediate post procedural complication. IMPRESSION: Successful placement of a 10.2 French cholecystostomy tube, as above. JMichaelle Birks MD [facility] Electronically Signed   By: JMichaelle BirksMD   On: 12/29/2020 16:50   DG CHEST PORT 1 VIEW  Result Date: 12/31/2020 CLINICAL DATA:  Hypoxia. EXAM: PORTABLE CHEST 1 VIEW COMPARISON:  12/31/2020. FINDINGS: Cardiomegaly. No pulmonary venous congestion. Interim decrease opacification right hemithorax with interim visualization of  the right upper lung. Findings consistent with partial resolution of large right pleural effusion. Large  right pleural effusion remains. Underlying pulmonary disease cannot be excluded. No pneumothorax. IMPRESSION: 1. Findings consistent with interim partial resolution of large right pleural effusion. Large right pleural effusion remains. Underlying right lung disease cannot be excluded. 2.  Cardiomegaly.  No pulmonary venous congestion. Electronically Signed   By: Marcello Moores  Register   On: 12/31/2020 06:07   DG Chest Port 1 View  Result Date: 12/31/2020 CLINICAL DATA:  Acute respiratory distress. EXAM: PORTABLE CHEST 1 VIEW COMPARISON:  Chest radiograph dated 12/29/2020. FINDINGS: There is complete opacification of the right imaged thorax with slight shift of the mediastinum to the right of the midline suggestive of volume loss. This is new since the prior radiograph and may represent atelectasis, infiltrate, or combination of pleural effusion and associated atelectasis/infiltrate. Occlusion of the central airway is not excluded. Linear density at the left lung base as seen on the prior radiograph. No pneumothorax. No acute osseous pathology. IMPRESSION: Complete opacification of the right imaged thorax with slight shift of the mediastinum to the right of the midline suggestive of volume loss. Clinical correlation recommended. These results were called by telephone at the time of interpretation on 12/31/2020 at 2:10 am to nurse Riley Churches , who verbally acknowledged these results. Electronically Signed   By: Anner Crete M.D.   On: 12/31/2020 02:18   DG ERCP  Result Date: 12/31/2020 CLINICAL DATA:  65 year old male undergoing ERCP for choledocholithiasis EXAM: ERCP TECHNIQUE: Multiple spot images obtained with the fluoroscopic device and submitted for interpretation post-procedure. FLUOROSCOPY TIME:  Fluoroscopy Time:  3 minutes 55 seconds Radiation Exposure Index (if provided by the fluoroscopic device): 182.4 Number of Acquired Spot Images: 0 COMPARISON:  MRCP 12/29/2020 FINDINGS: A total of 14  intraoperative saved images are submitted for review. The images demonstrate a flexible duodenal scope in the descending duodenum. A percutaneous cholecystostomy tube is present. Subsequent images demonstrate cannulation of the common bile duct. Cholangiogram demonstrates a small filling defect in the distal common bile duct consistent with choledocholithiasis. The filling defect is no longer present on the final images consistent with removal. IMPRESSION: 1. Choledocholithiasis. 2. Percutaneous cholecystostomy tube in place. 3. ERCP as above. These images were submitted for radiologic interpretation only. Please see the procedural report for the amount of contrast and the fluoroscopy time utilized. Electronically Signed   By: Jacqulynn Cadet M.D.   On: 12/31/2020 08:41    ASSESMENT:   Choledocholithiasis.  Cholangitis. 12/30/2020 ERCP. sphincterotomy, balloon extraction of sludge, pus.  Minor bleeding at sphincterotomy site successfully treated with epinephrine.  WBCs 13.1.  No fever.  Overall LFTs improved but fluctuating.  Cholelithiasis.  Cholecystitis.  12/29/20 per cholecystostomy drain.  WBCs 13.1.  No fever.  Tube output 40 mL on 8/2, nothing recorded on 8/3, 45 mL recorded so far today.  Chronic Coumadin, on hold.  History stroke, A. fib.  Rapid A. fib.  Heart rate to 150.  On amiodarone.  Acute hypoxemia (sats to 83%) attributed to chronic hypoxemic and hypercarbic respiratory failure.  Pulmonary attributes additional problems of poor cough mechanics and likely mucous plugging leading to right lung atelectasis, underlying OSA.  Improved with AVAPS (bipap) ventilation.  Toxic metabolic encephalopathy, multifactorial.  Anemia.  Hb 11 point >> 9.9.  MCV 86.  Hb 9.2 ten months ago.       Thrombocytopenia.  Platelets 142.  AKI.  Oliguria.  Urine output 900 mL yesterday, 400 mL  thus far today   PLAN   Agree with plans for palliative care, goals of care discussion with family.  For now  patient planned transfer to ICU.  Management of cholecystostomy tube per surgery and IR.  Normally needs to remain in place at least 6 weeks.    Azucena Freed  12/31/2020, 10:13 AM Phone [phone]

## 2020-12-31 NOTE — Progress Notes (Signed)
[place] for Heparin (warfarin on hold) Indication: Afib, history of DVT  Allergies  Allergen Reactions   Codeine Other (See Comments)    Unknown Reaction    Patient Measurements: Height: 6' (182.9 cm) Weight: 131.5 kg (289 lb 14.5 oz) IBW/kg (Calculated) : 77.6  Vital Signs: Temp: 98.7 F (37.1 C) (08/04 1220) Temp Source: Axillary (08/04 1220) BP: 106/71 (08/04 1300) Pulse Rate: 98 (08/04 1300)  Labs: Recent Labs    12/29/20 0340 12/30/20 0032 12/31/20 0641  HGB 11.3* 11.3* 9.9*  HCT 35.5* 36.2* 33.9*  PLT 165 142* 142*  LABPROT 16.1* 17.1* 17.4*  INR 1.3* 1.4* 1.4*  HEPARINUNFRC  --  <0.10*  --   CREATININE 1.77* 1.71* 2.13*     Estimated Creatinine Clearance: 47.9 mL/min (A) (by C-G formula based on SCr of 2.13 mg/dL (H)).   Medical History: Past Medical History:  Diagnosis Date   Absence epileptic syndrome, not intractable, without status epilepticus ([place]) 07/23/2019   Acute renal failure superimposed on stage 3 chronic kidney disease ([place]) 07/23/2019   Atherosclerotic heart disease 07/23/2019   Atrial fibrillation with RVR ([place]) 12/14/2019   Benign enlargement of prostate 07/23/2019   Body mass index 50.0-59.9, adult ([place]) 07/23/2019   Cellulitis of leg 12/14/2019   Chronic respiratory failure with hypoxia ([place]) 07/23/2019   Chronic venous hypertension (idiopathic) with ulcer of left lower extremity ([place]) 07/23/2019   COPD (chronic obstructive pulmonary disease) ([place]) 07/23/2019   Diabetic glomerulopathy ([place]) 10/15/2019   Diabetic neuropathy ([facility]) 07/23/2019   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[place]) 10/15/2019   Epilepsy, grand mal ([place])    Essential hypertension 01/22/2018   Frozen shoulder    Right Shoulder   Gout attack 10/06/2019   H/O acute myocardial infarction 01/22/2018   H/O: stroke 01/22/2018   History of DVT (deep vein thrombosis) 01/22/2018   Incontinence without sensory awareness 07/23/2019   Long term (current) use of  anticoagulants 07/23/2019   Lumbago    Lymphedema of both lower extremities 01/22/2018   Mixed hyperlipidemia 07/23/2019   Morbid obesity ([place]) 07/23/2019   Obstructive sleep apnea 07/23/2019   Paroxysmal atrial fibrillation ([place]) 01/07/2020   Peripheral vascular disease ([place])    Peripheral vascular disease, unspecified ([place]) 07/12/2011   Secondary hypercoagulable state ([place]) 01/07/2020   Shoulder bursitis 07/25/2019   Stroke [facility])    Urinary incontinence 07/25/2019    Assessment: 65 y/o M from SNF with hypoxemia. Unable to follow commands in the ED. Having some abdominal pain. On warfarin PTA for history of DVT and afib- of note, Nursing MAR reported as not taking (unclear why).  Underwent cholecystostomy tube placement 8/2 and ERCP on 8/3 with choledocholithiasis found, sphincterotomy performed. Minor bleeding at sphincterotomy site treated with epinephrine. Patient was on IV heparin prior and was held post procedure. Discussed with Dr. Shearon Stalls and will resume heparin. Patient previously subtherapeutic on heparin 1400 units/hr. Hgb 9.9 - decrease. Plts 142. No bleeding per RN.   Goal of Therapy:  Heparin level 0.3-0.7 units/mL Monitor platelets by anticoagulation protocol: Yes   Plan:  Resume heparin at 1600 units/hr  Check 8 hr heparin level  Monitor heparin level, CBC and s/s of bleeding.   Cristela Felt, PharmD, BCPS Clinical Pharmacist 12/31/2020 1:26 PM

## 2020-12-31 NOTE — Progress Notes (Signed)
Placed patient in AVAPS mode on V60 BIPAP per Dr. Silas Flood to try and increase minute ventilation and tidal volume. Will redraw ABG in an hour to see if improvement is made.

## 2020-12-31 NOTE — Anesthesia Postprocedure Evaluation (Signed)
Anesthesia Post Note  Patient: Jerardo Sundquist  Procedure(s) Performed: ENDOSCOPIC RETROGRADE CHOLANGIOPANCREATOGRAPHY (ERCP) REMOVAL OF STONES SPHINCTEROTOMY     Patient location during evaluation: PACU Anesthesia Type: General Level of consciousness: awake Pain management: pain level controlled Vital Signs Assessment: post-procedure vital signs reviewed and stable Respiratory status: spontaneous breathing, nonlabored ventilation, respiratory function stable and patient connected to nasal cannula oxygen Cardiovascular status: blood pressure returned to baseline and stable Postop Assessment: no apparent nausea or vomiting Anesthetic complications: no   No notable events documented.  Last Vitals:  Vitals:   12/31/20 1816 12/31/20 1942  BP:    Pulse: 90 (!) 101  Resp: 10 (!) 27  Temp:    SpO2: 98% 99%    Last Pain:  Vitals:   12/31/20 1602  TempSrc: Axillary  PainSc:                  Catalina Gravel

## 2020-12-31 NOTE — Progress Notes (Signed)
Patient seen and evaluated at bedside. HR 160s at the time of my assessment, with initially soft SBP 90s, and trending down to 80s during assessment. RN reports that patient came back from ERCP and had normal vitals. Later in the shift he went into Afib RVR with desats down to 70s. Patient was placed on BiPAP. ABG showed significant CO2 retention. ICU physician on floor, adjusted BiPAP. Sats now 100%. CXR ordered shows white out of the right side. Breath sounds are decreased on right markedly. With Slight shift on CXR - volume loss vs mucous plug? Hypertonic neb for pulm toilet. Advised to continue PRN albuterol nebs. Repeat ABG and CXR in 1 hour. Pressure started trending down, so NS bolus ordered. Patient HR sustaining 160s - 2/2 to pulm issues described above - but amiodarone bolus started to start to control afib while pulm issues addressed. Upon recheck of ABG and CXR - decision will be made to send to ICU or keep on progressive.

## 2020-12-31 NOTE — Progress Notes (Signed)
Pt transferred to [id number] w/ RRT on bipap. RN at bedside to receive. Family escorted to 82M waiting room and updated on plan of care.  Clyde Canterbury, RN

## 2021-01-01 ENCOUNTER — Inpatient Hospital Stay (HOSPITAL_COMMUNITY): Payer: No Typology Code available for payment source

## 2021-01-01 DIAGNOSIS — I480 Paroxysmal atrial fibrillation: Secondary | ICD-10-CM | POA: Diagnosis not present

## 2021-01-01 DIAGNOSIS — R0603 Acute respiratory distress: Secondary | ICD-10-CM

## 2021-01-01 DIAGNOSIS — K810 Acute cholecystitis: Secondary | ICD-10-CM | POA: Diagnosis not present

## 2021-01-01 LAB — CULTURE, BLOOD (ROUTINE X 2): Special Requests: ADEQUATE

## 2021-01-01 LAB — CBC WITH DIFFERENTIAL/PLATELET
Abs Immature Granulocytes: 0.35 10*3/uL — ABNORMAL HIGH (ref 0.00–0.07)
Basophils Absolute: 0.1 10*3/uL (ref 0.0–0.1)
Basophils Relative: 1 %
Eosinophils Absolute: 0.1 10*3/uL (ref 0.0–0.5)
Eosinophils Relative: 1 %
HCT: 32.2 % — ABNORMAL LOW (ref 39.0–52.0)
Hemoglobin: 9.9 g/dL — ABNORMAL LOW (ref 13.0–17.0)
Immature Granulocytes: 3 %
Lymphocytes Relative: 6 %
Lymphs Abs: 0.7 10*3/uL (ref 0.7–4.0)
MCH: 27 pg (ref 26.0–34.0)
MCHC: 30.7 g/dL (ref 30.0–36.0)
MCV: 88 fL (ref 80.0–100.0)
Monocytes Absolute: 1.4 10*3/uL — ABNORMAL HIGH (ref 0.1–1.0)
Monocytes Relative: 12 %
Neutro Abs: 8.9 10*3/uL — ABNORMAL HIGH (ref 1.7–7.7)
Neutrophils Relative %: 77 %
Platelets: 153 10*3/uL (ref 150–400)
RBC: 3.66 MIL/uL — ABNORMAL LOW (ref 4.22–5.81)
RDW: 17 % — ABNORMAL HIGH (ref 11.5–15.5)
WBC: 11.5 10*3/uL — ABNORMAL HIGH (ref 4.0–10.5)
nRBC: 0 % (ref 0.0–0.2)

## 2021-01-01 LAB — COMPREHENSIVE METABOLIC PANEL
ALT: 38 U/L (ref 0–44)
AST: 98 U/L — ABNORMAL HIGH (ref 15–41)
Albumin: 1.6 g/dL — ABNORMAL LOW (ref 3.5–5.0)
Alkaline Phosphatase: 234 U/L — ABNORMAL HIGH (ref 38–126)
Anion gap: 11 (ref 5–15)
BUN: 43 mg/dL — ABNORMAL HIGH (ref 8–23)
CO2: 28 mmol/L (ref 22–32)
Calcium: 8.7 mg/dL — ABNORMAL LOW (ref 8.9–10.3)
Chloride: 100 mmol/L (ref 98–111)
Creatinine, Ser: 2.08 mg/dL — ABNORMAL HIGH (ref 0.61–1.24)
GFR, Estimated: 34 mL/min — ABNORMAL LOW (ref >60)
Glucose, Bld: 208 mg/dL — ABNORMAL HIGH (ref 70–99)
Potassium: 3.1 mmol/L — ABNORMAL LOW (ref 3.5–5.1)
Sodium: 139 mmol/L (ref 135–145)
Total Bilirubin: 3.7 mg/dL — ABNORMAL HIGH (ref 0.3–1.2)
Total Protein: 6 g/dL — ABNORMAL LOW (ref 6.5–8.1)

## 2021-01-01 LAB — POCT I-STAT 7, (LYTES, BLD GAS, ICA,H+H)
Acid-Base Excess: 3 mmol/L — ABNORMAL HIGH (ref 0.0–2.0)
Bicarbonate: 28.9 mmol/L — ABNORMAL HIGH (ref 20.0–28.0)
Calcium, Ion: 1.19 mmol/L (ref 1.15–1.40)
HCT: 31 % — ABNORMAL LOW (ref 39.0–52.0)
Hemoglobin: 10.5 g/dL — ABNORMAL LOW (ref 13.0–17.0)
O2 Saturation: 90 %
Patient temperature: 98.3
Potassium: 3.1 mmol/L — ABNORMAL LOW (ref 3.5–5.1)
Sodium: 140 mmol/L (ref 135–145)
TCO2: 30 mmol/L (ref 22–32)
pCO2 arterial: 48.1 mmHg — ABNORMAL HIGH (ref 32.0–48.0)
pH, Arterial: 7.385 (ref 7.350–7.450)
pO2, Arterial: 59 mmHg — ABNORMAL LOW (ref 83.0–108.0)

## 2021-01-01 LAB — GLUCOSE, CAPILLARY
Glucose-Capillary: 186 mg/dL — ABNORMAL HIGH (ref 70–99)
Glucose-Capillary: 198 mg/dL — ABNORMAL HIGH (ref 70–99)
Glucose-Capillary: 168 mg/dL — ABNORMAL HIGH (ref 70–99)
Glucose-Capillary: 137 mg/dL — ABNORMAL HIGH (ref 70–99)
Glucose-Capillary: 139 mg/dL — ABNORMAL HIGH (ref 70–99)

## 2021-01-01 LAB — ECHOCARDIOGRAM COMPLETE
Area-P 1/2: 3.58 cm2
Height: 72 in
S' Lateral: 3.3 cm
Weight: 4091.74 oz

## 2021-01-01 LAB — PROTIME-INR
INR: 1.3 — ABNORMAL HIGH (ref 0.8–1.2)
Prothrombin Time: 16.4 seconds — ABNORMAL HIGH (ref 11.4–15.2)

## 2021-01-01 LAB — HEPARIN LEVEL (UNFRACTIONATED)
Heparin Unfractionated: 0.2 IU/mL — ABNORMAL LOW (ref 0.30–0.70)
Heparin Unfractionated: 0.33 IU/mL (ref 0.30–0.70)

## 2021-01-01 LAB — PHOSPHORUS: Phosphorus: 3.2 mg/dL (ref 2.5–4.6)

## 2021-01-01 LAB — MAGNESIUM: Magnesium: 2.8 mg/dL — ABNORMAL HIGH (ref 1.7–2.4)

## 2021-01-01 MED ORDER — FUROSEMIDE 10 MG/ML IJ SOLN
40.0000 mg | Freq: Once | INTRAMUSCULAR | Status: AC
  Administered 2021-01-01: 40 mg via INTRAVENOUS
  Filled 2021-01-01: qty 4

## 2021-01-01 MED ORDER — DIVALPROEX SODIUM 500 MG PO DR TAB
500.0000 mg | DELAYED_RELEASE_TABLET | Freq: Two times a day (BID) | ORAL | Status: DC
  Administered 2021-01-01 – 2021-01-07 (×13): 500 mg via ORAL
  Filled 2021-01-01 (×15): qty 1

## 2021-01-01 MED ORDER — GUAIFENESIN 100 MG/5ML PO SOLN
5.0000 mL | Freq: Two times a day (BID) | ORAL | Status: DC
  Administered 2021-01-01 – 2021-01-07 (×14): 100 mg via ORAL
  Filled 2021-01-01 (×10): qty 10
  Filled 2021-01-01 (×2): qty 5
  Filled 2021-01-01: qty 10
  Filled 2021-01-01: qty 5

## 2021-01-01 MED ORDER — POTASSIUM CHLORIDE 10 MEQ/100ML IV SOLN
10.0000 meq | INTRAVENOUS | Status: AC
  Administered 2021-01-01 (×4): 10 meq via INTRAVENOUS
  Filled 2021-01-01 (×4): qty 100

## 2021-01-01 MED ORDER — DIVALPROEX SODIUM 125 MG PO DR TAB
125.0000 mg | DELAYED_RELEASE_TABLET | Freq: Two times a day (BID) | ORAL | Status: DC
  Administered 2021-01-01 – 2021-01-07 (×13): 125 mg via ORAL
  Filled 2021-01-01 (×15): qty 1

## 2021-01-01 MED ORDER — METOPROLOL TARTRATE 12.5 MG HALF TABLET
12.5000 mg | ORAL_TABLET | Freq: Two times a day (BID) | ORAL | Status: DC
  Administered 2021-01-01: 12.5 mg via ORAL
  Filled 2021-01-01 (×3): qty 1

## 2021-01-01 MED ORDER — SODIUM CHLORIDE 3 % IN NEBU
4.0000 mL | INHALATION_SOLUTION | Freq: Every day | RESPIRATORY_TRACT | Status: AC
  Administered 2021-01-02 – 2021-01-03 (×2): 4 mL via RESPIRATORY_TRACT
  Filled 2021-01-01 (×4): qty 4

## 2021-01-01 MED ORDER — PERFLUTREN LIPID MICROSPHERE
1.0000 mL | INTRAVENOUS | Status: AC | PRN
  Administered 2021-01-01: 3 mL via INTRAVENOUS
  Filled 2021-01-01: qty 10

## 2021-01-01 MED ORDER — INSULIN ASPART 100 UNIT/ML IJ SOLN
0.0000 [IU] | INTRAMUSCULAR | Status: DC
  Administered 2021-01-01 (×2): 2 [IU] via SUBCUTANEOUS
  Administered 2021-01-01 (×2): 1 [IU] via SUBCUTANEOUS
  Administered 2021-01-02: 2 [IU] via SUBCUTANEOUS
  Administered 2021-01-02: 1 [IU] via SUBCUTANEOUS
  Administered 2021-01-02: 2 [IU] via SUBCUTANEOUS
  Administered 2021-01-02: 1 [IU] via SUBCUTANEOUS
  Administered 2021-01-03 (×4): 2 [IU] via SUBCUTANEOUS
  Administered 2021-01-03: 1 [IU] via SUBCUTANEOUS
  Administered 2021-01-04: 2 [IU] via SUBCUTANEOUS
  Administered 2021-01-04 (×2): 1 [IU] via SUBCUTANEOUS
  Administered 2021-01-04 (×2): 2 [IU] via SUBCUTANEOUS
  Administered 2021-01-05 (×2): 1 [IU] via SUBCUTANEOUS
  Administered 2021-01-05 (×2): 2 [IU] via SUBCUTANEOUS
  Administered 2021-01-05 – 2021-01-06 (×2): 1 [IU] via SUBCUTANEOUS
  Administered 2021-01-06 (×3): 2 [IU] via SUBCUTANEOUS
  Administered 2021-01-07: 1 [IU] via SUBCUTANEOUS
  Administered 2021-01-07: 2 [IU] via SUBCUTANEOUS
  Administered 2021-01-07: 1 [IU] via SUBCUTANEOUS

## 2021-01-01 NOTE — Progress Notes (Addendum)
[place] for Heparin (warfarin on hold) Indication: Afib, history of DVT  Allergies  Allergen Reactions   Codeine Other (See Comments)    Unknown Reaction    Patient Measurements: Height: 6' (182.9 cm) Weight: 116 kg (255 lb 11.7 oz) IBW/kg (Calculated) : 77.6  Vital Signs: Temp: 97.5 F (36.4 C) (08/05 0827) Temp Source: Oral (08/05 0827) BP: 168/67 (08/05 0700) Pulse Rate: 63 (08/05 0700)  Labs: Recent Labs    12/30/20 0032 12/31/20 0641 12/31/20 2224 01/01/21 0446 01/01/21 0723  HGB 11.3* 9.9*  --  10.5* 9.9*  HCT 36.2* 33.9*  --  31.0* 32.2*  PLT 142* 142*  --   --  153  LABPROT 17.1* 17.4*  --   --  16.4*  INR 1.4* 1.4*  --   --  1.3*  HEPARINUNFRC <0.10*  --  0.12*  --  0.20*  CREATININE 1.71* 2.13*  --   --  2.08*     Estimated Creatinine Clearance: 46 mL/min (A) (by C-G formula based on SCr of 2.08 mg/dL (H)).   Medical History: Past Medical History:  Diagnosis Date   Absence epileptic syndrome, not intractable, without status epilepticus ([place]) 07/23/2019   Acute renal failure superimposed on stage 3 chronic kidney disease ([place]) 07/23/2019   Atherosclerotic heart disease 07/23/2019   Atrial fibrillation with RVR ([facility]) 12/14/2019   Benign enlargement of prostate 07/23/2019   Body mass index 50.0-59.9, adult ([place]) 07/23/2019   Cellulitis of leg 12/14/2019   Chronic respiratory failure with hypoxia ([place]) 07/23/2019   Chronic venous hypertension (idiopathic) with ulcer of left lower extremity ([place]) 07/23/2019   COPD (chronic obstructive pulmonary disease) ([place]) 07/23/2019   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   Diabetic neuropathy ([place]) 07/23/2019   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[place]) 10/15/2019   Epilepsy, grand mal ([place])    Essential hypertension 01/22/2018   Gout attack 10/06/2019   H/O: stroke 01/22/2018   History of DVT (deep vein thrombosis) 01/22/2018   Long term (current) use of anticoagulants 07/23/2019    Lumbago    Lymphedema of both lower extremities 01/22/2018   Mixed hyperlipidemia 07/23/2019   Morbid obesity ([place]) 07/23/2019   Obstructive sleep apnea 07/23/2019   Paroxysmal atrial fibrillation ([place]) 01/07/2020   Peripheral vascular disease, unspecified ([place]) 07/12/2011   Secondary hypercoagulable state ([place]) 01/07/2020   Shoulder bursitis 07/25/2019   Stroke [facility])     Assessment: 65 y/o M from SNF with hypoxemia. Unable to follow commands in the ED. Having some abdominal pain. On warfarin PTA for history of DVT and afib- of note, Nursing MAR reported as not taking (unclear why).  Underwent cholecystostomy tube placement 8/2 and ERCP on 8/3 with choledocholithiasis found, sphincterotomy performed. Minor bleeding at sphincterotomy site treated with epinephrine. Patient was on IV heparin prior and was held post procedure. Discussed with Dr. Shearon Stalls and will resume heparin. Patient has remained subtherapeutic with heparin running @ 2000 u/h (heparin level 0.20). H/H stable, PLT WNL, no signs of bleeding noted. Heparin level was drawn by lab via peripheral stick from opposite side of where heparin was running.   Goal of Therapy:  Heparin level 0.3-0.7 units/mL Monitor platelets by anticoagulation protocol: Yes   Plan:  Increase heparin to 2,200 units/h  Check 8 hr heparin level  F/U restart of warfarin  Monitor heparin level, CBC and s/s of bleeding.   Adria Dill, PharmD PGY-1 Acute Care Resident  01/01/2021 10:13 AM

## 2021-01-01 NOTE — Progress Notes (Addendum)
NAME:  Julia Kulzer, MRN:  [id number], DOB:  [birth year], LOS: 3 ADMISSION DATE:  12/28/2020, CONSULTATION DATE:  01/01/21 REFERRING MD:  TRH, CHIEF COMPLAINT: Encephalopathy  History of Present Illness:  65 year old who lives at SNF with multiple chronic medical issues include hyperlipidemia, hypertension, OSA on CPAP, A. fib on Coumadin, reported COPD although no PFTs to verify this admitted with ascending cholangitis.  Placed on antibiotics.  ERCP 8/3.  Decompensation early morning 8/4 with hypoxemia and A. fib with RVR.  Chest x-ray revealed right lung atelectasis with volume loss and mediastinal shift to the right.  More hypoxemic.  ABG reveals significant hypoxemia and mild hypercarbia.  Placed on AVAPS with some initial improvement in mental status.  Pertinent  Medical History  Hyperlipidemia, diabetes, hypertension, atrial fibrillation on Coumadin  Significant Hospital Events: Including procedures, antibiotic start and stop dates in addition to other pertinent events   8/1 presents to ED, septic, biliary source,, surgery eval recommended MRCP, this revealed cholangitis 8/2 patient mid to the hospital, [place] 8/3 ERCP 8/4 hypoxemia, A. fib with RVR, chest x-ray reveals right lung atelectasis with volume loss, hypercarbic, hypotensive prompting PCCM evaluation Awake and alert, slow to respond to questions, following commands, sats 100% on 3L>>CXR >>Right lower lobe consolidation and right effusion unchanged   Objective   Blood pressure (!) 168/67, pulse 63, temperature (!) 97.5 F (36.4 C), temperature source Oral, resp. rate (!) 24, height 6' (1.829 m), weight 116 kg, SpO2 100 %.    FiO2 (%):  [40 %] 40 %   Intake/Output Summary (Last 24 hours) at 01/01/2021 0904 Last data filed at 01/01/2021 0600 Gross per 24 hour  Intake 1635.49 ml  Output 300 ml  Net 1335.49 ml   Filed Weights   12/28/20 2232 12/30/20 0355 01/01/21 0424  Weight: 130.6 kg 131.5 kg 116 kg     Examination: General: Acute and chronically ill-appearing, sitting  in bed, very weak and slow to respond, but awake and alert Pulmonary: Bilateral Cheat excursion, Very diminished on right , coarse on the left, normal work of breathing Cardiovascular: S1, S2, Irregularly irregular, SR on amio, Cap refill < 3 seconds MSK: PRAFO boots on, no synovitis, physically deconditioned Skin: Dry, flaky, increased turgor, shiny Abdomen : Right Flank drain in place   + 4500 cc's since admission K 3.1, Creatinine 2.08, Mag 2.8, Alk Phos 234, Albumin 1.6, AST 98, Total Bili 3.7 Calcium corrects to 10.6 Klebsiella Pneumonia per GB culture, Afebrile, WBC is down trending>> Will defer to Surgery for de-escalation   Resolved Hospital Problem list     Assessment & Plan:  Acute on chronic hypoxemic and hypercarbic respiratory failure: In the setting of left-sided profound weakness, poor cough mechanics and likely mucous plug leading to complete atelectasis of right lung on chest x-ray.  Underlying OSA.  ABG with significant hypoxemia and hypercarbia. Suspect Aspiration event during ERCP --Placed on AVAPS, tidal volume 600, backup rate 20, seem to be slightly more responsive after initiation of this --If mental status not improving, recommend repeat ABG, consider mechanical ventilation if not improving --Agressive Pulmonary toilet, airway clearance, IS, Flutter, Mucolytic - Will add hypertonic saline nebs x 3 days  - Swallow eval - CXR in am and prn - ABG's prn to trend CO2 / and for AMS - Night time BiPAP - Titrate oxygen for sats > 57%  Toxic metabolic encephalopathy:  Multifactorial related to mild hypercarbia, likely component of delirium, potential sepsis. GB drain grew out Klebsiella Pneumonia Plan -  Continue antibiotics  - Trend fever curve and WBC - Re-culture as is clinically indicated - ABG prn - Frequent re-orientation  Atrial fibrillation with RVR: Suspect triggered by lung  collapse, hypoxemia. - Rate controlled 8/5 on Amio --Metoprolol, amiodarone >> HR in the 50's, will DC - prn Lopressor for HR - Tele monitoring - EKG prn - Heparin gtt per pharmacy  Hypotension: In the setting of AV nodal blockade and amiodarone likely with underlying hypovolemia given poor p.o. intake.  Possible distributive issue given biliary sepsis.  - MAP Goal of > 65 - Consider Peripheral Levo as needed  AKI Plan Strict I&O Trend BMET  Hyperglycemia Plan Will change CBG and SS coverage to Q 4 ( Were Q 6 prn and was not getting coverage)   Resolving Leukocytosis Afebrile Klebsiella Pneumonia per GB culture Plan Continue Ceftriaxone Consider de-escalation of antibiotics per Surgery   Patient status is tenuous.   Given patient's physical condition, strongly recommend readdressing goals of care, consider palliative care consult in the future.  Best Practice (right click and "Reselect all SmartList Selections" daily)   Per primary  Labs   CBC: Recent Labs  Lab 12/28/20 2141 12/29/20 0340 12/30/20 0032 12/31/20 0641 01/01/21 0446 01/01/21 0723  WBC 20.4* 16.7* 9.3 13.1*  --  11.5*  NEUTROABS 17.7*  --  7.4  --   --  8.9*  HGB 11.9* 11.3* 11.3* 9.9* 10.5* 9.9*  HCT 38.6* 35.5* 36.2* 33.9* 31.0* 32.2*  MCV 86.2 86.0 87.2 90.9  --  88.0  PLT 198 165 142* 142*  --  962    Basic Metabolic Panel: Recent Labs  Lab 12/28/20 2141 12/29/20 0340 12/30/20 0032 12/31/20 0641 01/01/21 0446  NA 131* 135 138 138 140  K 3.8 3.5 3.4* 4.1 3.1*  CL 89* 94* 96* 99  --   CO2 _0 --   GLUCOSE 248* 207* 181* 238*  --   BUN 32* 28* 28* 35*  --   CREATININE 1.92* 1.77* 1.71* 2.13*  --   CALCIUM 9.2 8.8* 9.1 8.8*  --   MG  --   --  1.4* 1.8  --    GFR: Estimated Creatinine Clearance: 44.9 mL/min (A) (by C-G formula based on SCr of 2.13 mg/dL (H)). Recent Labs  Lab 12/28/20 2145 12/28/20 2230 12/29/20 0340 12/30/20 0032 12/31/20 0641 01/01/21 0723  WBC   --   --  16.7* 9.3 13.1* 11.5*  LATICACIDVEN 2.2* 2.5* 1.4  --   --   --     Liver Function Tests: Recent Labs  Lab 12/28/20 2141 12/29/20 0340 12/30/20 0032 12/31/20 0641  AST 54* 48* 54* 67*  ALT _1 ALKPHOS 267* 244* 200* 212*  BILITOT 5.2* 4.6* 4.4* 4.7*  PROT 7.6 6.7 6.7 6.2*  ALBUMIN 2.0* 1.7* 1.8* 1.5*   No results for input(s): LIPASE, AMYLASE in the last 168 hours. Recent Labs  Lab 12/28/20 2200  AMMONIA 16    ABG    Component Value Date/Time   PHART 7.385 01/01/2021 0446   PCO2ART 48.1 (H) 01/01/2021 0446   PO2ART 59 (L) 01/01/2021 0446   HCO3 28.9 (H) 01/01/2021 0446   TCO2 30 01/01/2021 0446   ACIDBASEDEF 1.3 12/31/2020 0340   O2SAT 90.0 01/01/2021 0446     Coagulation Profile: Recent Labs  Lab 12/28/20 2141 12/29/20 0340 12/30/20 0032 12/31/20 0641 01/01/21 0723  INR 1.3* 1.3* 1.4* 1.4* 1.3*    Cardiac Enzymes: No results  for input(s): CKTOTAL, CKMB, CKMBINDEX, TROPONINI in the last 168 hours.  HbA1C: Hemoglobin A1C  Date/Time Value Ref Range Status  06/07/2019 12:00 AM 8.4  Final  11/12/2018 12:00 AM 8.0  Final   Hgb A1c MFr Bld  Date/Time Value Ref Range Status  12/29/2020 03:40 AM 8.6 (H) 4.8 - 5.6 % Final    Comment:    (NOTE) Pre diabetes:          5.7%-6.4%  Diabetes:              >6.4%  Glycemic control for   <7.0% adults with diabetes   03/26/2020 11:37 AM 7.2 (H) 4.8 - 5.6 % Final    Comment:             Prediabetes: 5.7 - 6.4          Diabetes: >6.4          Glycemic control for adults with diabetes: <7.0     CBG: Recent Labs  Lab 12/31/20 1217 12/31/20 1705 12/31/20 2213 01/01/21 0424 01/01/21 0825  GLUCAP 208* 210* 223* 186* 198*   Allergies Allergies  Allergen Reactions   Codeine Other (See Comments)    Unknown Reaction     Home Medications  Prior to Admission medications   Medication Sig Start Date End Date Taking? Authorizing Provider  albuterol (PROVENTIL) (2.5 MG/3ML) 0.083%  nebulizer solution Inhale 3 mLs (2.5 mg total) into the lungs every 6 (six) hours as needed for wheezing or shortness of breath. 03/18/20  Yes Lillard Anes, MD  aspirin 325 MG tablet Take 325 mg by mouth daily.   Yes [provider]  atorvastatin (LIPITOR) 10 MG tablet Take 10 mg by mouth daily.   Yes [provider]  Cholecalciferol (VITAMIN D3) 1.25 MG (50000 UT) TABS Take 50,000 Units by mouth 2 (two) times a week. Every Wed and Sat   Yes [provider]  divalproex (DEPAKOTE) 125 MG DR tablet Take 125 mg by mouth 2 (two) times daily.   Yes [provider]  divalproex (DEPAKOTE) 500 MG DR tablet Take 500 mg by mouth 2 (two) times daily.   Yes [provider]  Dulaglutide (TRULICITY) 3.33 LK/5.6YB SOPN Inject 0.75 mg into the skin every Tuesday.   Yes [provider]  DULoxetine (CYMBALTA) 60 MG capsule Take 60 mg by mouth daily.   Yes [provider]  EPINEPHrine 0.3 mg/0.3 mL IJ SOAJ injection Inject 0.3 mg into the muscle as needed for anaphylaxis. 03/18/20  Yes Lillard Anes, MD  fluticasone [facility]) 50 MCG/ACT nasal spray Place 1 spray into both nostrils daily. Patient taking differently: Place 1 spray into both nostrils daily as needed for allergies. 10/30/19  Yes Lillard Anes, MD  gabapentin (NEURONTIN) 300 MG capsule Take 1 capsule (300 mg total) by mouth at bedtime. 11/18/19  Yes Lillard Anes, MD  glucose blood test strip 1 each by Other route as needed. Use as instructed 03/17/20  Yes Lillard Anes, MD  isosorbide mononitrate (IMDUR) 120 MG 24 hr tablet Take 1 tablet (120 mg total) by mouth daily. 01/16/20  Yes Lillard Anes, MD  metoprolol succinate (TOPROL-XL) 25 MG 24 hr tablet Take 25 mg by mouth daily.   Yes [provider]  polyethylene glycol powder (GLYCOLAX/MIRALAX) 17 GM/SCOOP powder Take 1 Container by mouth every morning.   Yes [provider]   potassium chloride (KLOR-CON) 10 MEQ tablet Take 10 mEq by mouth daily.  Yes [provider]  risperiDONE (RISPERDAL) 1 MG tablet Take 1 mg by mouth 2 (two) times daily.   Yes [provider]  torsemide (DEMADEX) 20 MG tablet Take 20 mg by mouth daily.   Yes [provider]  allopurinol (ZYLOPRIM) 300 MG tablet Take 1 tablet (300 mg total) by mouth daily. Patient not taking: No sig reported 03/18/20   Lillard Anes, MD  carvedilol (COREG) 3.125 MG tablet Take 1 tablet (3.125 mg total) by mouth 2 (two) times daily with a meal. Patient not taking: No sig reported 02/05/20   Lillard Anes, MD  diltiazem (CARDIZEM) 60 MG tablet Take 1 tablet (60 mg total) by mouth 2 (two) times daily. Patient not taking: No sig reported 02/05/20   Lillard Anes, MD  metFORMIN (GLUCOPHAGE) 1000 MG tablet Take 1,000 mg by mouth 2 (two) times daily with a meal. Patient not taking: No sig reported    [provider]  phenytoin (DILANTIN) 300 MG ER capsule Take 1 capsule (300 mg total) by mouth 2 (two) times daily. Patient currently on 100 mg 2 times a day Patient not taking: No sig reported 01/16/20   Lillard Anes, MD  pravastatin (PRAVACHOL) 40 MG tablet Take 1 tablet (40 mg total) by mouth daily. Patient not taking: No sig reported 11/18/19   Lillard Anes, MD  TOVIAZ 4 MG TB24 tablet TAKE 1 TABLET EVERY DAY Patient not taking: No sig reported 03/25/20   Lillard Anes, MD  triamcinolone cream (KENALOG) 0.1 % APPLY TO THE AFFECTED AREA(S) TWICE DAILY AS NEEDED Patient not taking: No sig reported 11/16/19   Lillard Anes, MD  warfarin (COUMADIN) 2 MG tablet Take 1 tablet (2 mg total) by mouth 3 (three) times a week. Monday, Wednesday and Friday Patient not taking: No sig reported 03/18/20   Lillard Anes, MD  warfarin (COUMADIN) 4 MG tablet Take 4 mg by mouth daily. Saturday, Sunday,tuesday and thursday Patient not  taking: No sig reported    [provider]     Critical care time: 9 minutes   Magdalen Spatz, MSN, AGACNP-BC [place] for personal pager PCCM on call pager [phone]   01/01/2021 9:50 AM  Attending:    Subjective: Feels about the same today WBC improved Remains confused  Objective: Vitals:   01/01/21 1155 01/01/21 1200 01/01/21 1300 01/01/21 1400  BP:  (!) 143/66 138/64 (!) 167/72  Pulse:  64 (!) 59 65  Resp:  19 16 (!) 23  Temp: 97.9 F (36.6 C)     TempSrc: Axillary     SpO2:  100% 100% 100%  Weight:      Height:       FiO2 (%):  [40 %] 40 %  Intake/Output Summary (Last 24 hours) at 01/01/2021 1504 Last data filed at 01/01/2021 1100 Gross per 24 hour  Intake 1007.55 ml  Output 300 ml  Net 707.55 ml    General:  Chronically ill appearing, resting comfortably in bed HENT: NCAT OP clear PULM: Rhonchi bilaterally, normal effort CV: RRR, no mgr GI: BS+, soft, nontender MSK: normal bulk and tone Neuro: drowsy, confused, alert, no distress, MAEW    CBC    Component Value Date/Time   WBC 11.5 (H) 01/01/2021 0723   RBC 3.66 (L) 01/01/2021 0723   HGB 9.9 (L) 01/01/2021 0723   HGB 9.2 (L) 03/26/2020 1137   HCT 32.2 (L) 01/01/2021 0723   HCT  28.6 (L) 03/26/2020 1137   PLT 153 01/01/2021 0723   PLT 242 03/26/2020 1137   MCV 88.0 01/01/2021 0723   MCV 87 03/26/2020 1137   MCH 27.0 01/01/2021 0723   MCHC 30.7 01/01/2021 0723   RDW 17.0 (H) 01/01/2021 0723   RDW 15.7 (H) 03/26/2020 1137   LYMPHSABS 0.7 01/01/2021 0723   LYMPHSABS 1.3 03/26/2020 1137   MONOABS 1.4 (H) 01/01/2021 0723   EOSABS 0.1 01/01/2021 0723   EOSABS 0.1 03/26/2020 1137   BASOSABS 0.1 01/01/2021 0723   BASOSABS 0.1 03/26/2020 1137    BMET    Component Value Date/Time   NA 139 01/01/2021 0723   NA 137 03/26/2020 1137   K 3.1 (L) 01/01/2021 0723   CL 100 01/01/2021 0723   CO2 28 01/01/2021 0723   GLUCOSE 208 (H) 01/01/2021  0723   BUN 43 (H) 01/01/2021 0723   BUN 59 (H) 03/26/2020 1137   CREATININE 2.08 (H) 01/01/2021 0723   CALCIUM 8.7 (L) 01/01/2021 0723   GFRNONAA 34 (L) 01/01/2021 0723   GFRAA 53 (L) 03/26/2020 1137    CXR images personally reviewed, right lower lobe pneumonia  Impression/Plan: Aspiration pneumonia> continue zosyn Klebsiella acute cholecystitis and choledocholithiasis s/p drain placement. Continue ceftriaxone per pharmacy Atelectasis RLL> add incentive spirometry, flutter today Hyperglycemia> change accucheck to q4h Acute hypoxemic respiratory failure, acute pulm edema > stop LR, give lasix today    My cc time 25 minutes  Roselie Awkward, MD [place] PCCM Pager: [phone] Cell: [phone] After 7pm: [phone]

## 2021-01-01 NOTE — Progress Notes (Signed)
OT Cancellation Note  Patient Details Name: Matthew Gutierrez MRN: [id number] DOB: Apr 25, 1956   Cancelled Treatment:    Reason Eval/Treat Not Completed: Active bedrest order.  Gloris Manchester OTR/L Supplemental OT, Department of rehab services [phone]  Dosha Broshears R H. 01/01/2021, 8:45 AM

## 2021-01-01 NOTE — Progress Notes (Signed)
eLink Physician-Brief Progress Note Patient Name: Chaun Ellenberg DOB: 11-Apr-1956 MRN: [id number]   Date of Service  01/01/2021  HPI/Events of Note  Agitation - Patient seems more confused and is refusing BiPAP. Nursing concerned about hypercarbia.   eICU Interventions  Plan: ABG STAT,     Intervention Category Major Interventions: Delirium, psychosis, severe agitation - evaluation and management  Kamila Broda Eugene 01/01/2021, 4:22 AM

## 2021-01-01 NOTE — Progress Notes (Signed)
Referring Physician(s): Dr. Georganna Skeans   Supervising Physician: Corrie Mckusick  Patient Status:  [facility] - In-pt  Chief Complaint: Acute cholecystitis s/p percutaneous cholecystostomy 12/29/20  Subjective: Patient in bed awake with eyes open but was non-verbal while I was in the room. He didn't respond to any of my questions. He did not appear to be in discomfort or distress.   Allergies: Codeine  Medications: Prior to Admission medications   Medication Sig Start Date End Date Taking? Authorizing Provider  albuterol (PROVENTIL) (2.5 MG/3ML) 0.083% nebulizer solution Inhale 3 mLs (2.5 mg total) into the lungs every 6 (six) hours as needed for wheezing or shortness of breath. 03/18/20  Yes Lillard Anes, MD  aspirin 325 MG tablet Take 325 mg by mouth daily.   Yes [provider]  atorvastatin (LIPITOR) 10 MG tablet Take 10 mg by mouth daily.   Yes [provider]  Cholecalciferol (VITAMIN D3) 1.25 MG (50000 UT) TABS Take 50,000 Units by mouth 2 (two) times a week. Every Wed and Sat   Yes [provider]  divalproex (DEPAKOTE) 125 MG DR tablet Take 125 mg by mouth 2 (two) times daily.   Yes [provider]  divalproex (DEPAKOTE) 500 MG DR tablet Take 500 mg by mouth 2 (two) times daily.   Yes [provider]  Dulaglutide (TRULICITY) [id number] SOPN Inject 0.75 mg into the skin every Tuesday.   Yes [provider]  DULoxetine (CYMBALTA) 60 MG capsule Take 60 mg by mouth daily.   Yes [provider]  EPINEPHrine 0.3 mg/0.3 mL IJ SOAJ injection Inject 0.3 mg into the muscle as needed for anaphylaxis. 03/18/20  Yes Lillard Anes, MD  fluticasone [facility]) 50 MCG/ACT nasal spray Place 1 spray into both nostrils daily. Patient taking differently: Place 1 spray into both nostrils daily as needed for allergies. 10/30/19  Yes Lillard Anes, MD  gabapentin (NEURONTIN) 300 MG capsule Take 1 capsule (300 mg  total) by mouth at bedtime. 11/18/19  Yes Lillard Anes, MD  glucose blood test strip 1 each by Other route as needed. Use as instructed 03/17/20  Yes Lillard Anes, MD  isosorbide mononitrate (IMDUR) 120 MG 24 hr tablet Take 1 tablet (120 mg total) by mouth daily. 01/16/20  Yes Lillard Anes, MD  metoprolol succinate (TOPROL-XL) 25 MG 24 hr tablet Take 25 mg by mouth daily.   Yes [provider]  polyethylene glycol powder (GLYCOLAX/MIRALAX) 17 GM/SCOOP powder Take 1 Container by mouth every morning.   Yes [provider]  potassium chloride (KLOR-CON) 10 MEQ tablet Take 10 mEq by mouth daily.   Yes [provider]  risperiDONE (RISPERDAL) 1 MG tablet Take 1 mg by mouth 2 (two) times daily.   Yes [provider]  torsemide (DEMADEX) 20 MG tablet Take 20 mg by mouth daily.   Yes [provider]  allopurinol (ZYLOPRIM) 300 MG tablet Take 1 tablet (300 mg total) by mouth daily. Patient not taking: No sig reported 03/18/20   Lillard Anes, MD  carvedilol (COREG) 3.125 MG tablet Take 1 tablet (3.125 mg total) by mouth 2 (two) times daily with a meal. Patient not taking: No sig reported 02/05/20   Lillard Anes, MD  diltiazem (CARDIZEM) 60 MG tablet Take 1 tablet (60 mg total) by mouth 2 (two) times daily. Patient not taking: No sig reported 02/05/20   Lillard Anes, MD  metFORMIN (GLUCOPHAGE) 1000 MG tablet Take 1,000  mg by mouth 2 (two) times daily with a meal. Patient not taking: No sig reported    [provider]  phenytoin (DILANTIN) 300 MG ER capsule Take 1 capsule (300 mg total) by mouth 2 (two) times daily. Patient currently on 100 mg 2 times a day Patient not taking: No sig reported 01/16/20   Lillard Anes, MD  pravastatin (PRAVACHOL) 40 MG tablet Take 1 tablet (40 mg total) by mouth daily. Patient not taking: No sig reported 11/18/19   Lillard Anes, MD  TOVIAZ 4 MG TB24  tablet TAKE 1 TABLET EVERY DAY Patient not taking: No sig reported 03/25/20   Lillard Anes, MD  triamcinolone cream (KENALOG) 0.1 % APPLY TO THE AFFECTED AREA(S) TWICE DAILY AS NEEDED Patient not taking: No sig reported 11/16/19   Lillard Anes, MD  warfarin (COUMADIN) 2 MG tablet Take 1 tablet (2 mg total) by mouth 3 (three) times a week. Monday, Wednesday and Friday Patient not taking: No sig reported 03/18/20   Lillard Anes, MD  warfarin (COUMADIN) 4 MG tablet Take 4 mg by mouth daily. Saturday, Sunday,tuesday and thursday Patient not taking: No sig reported    [provider]     Vital Signs: BP (!) 156/66   Pulse 65   Temp 97.9 F (36.6 C) (Axillary)   Resp 19   Ht 6' (1.829 m)   Wt 255 lb 11.7 oz (116 kg)   SpO2 100%   BMI 34.68 kg/m   Physical Exam Constitutional:      General: He is not in acute distress.    Appearance: He is not ill-appearing.  Pulmonary:     Effort: Pulmonary effort is normal.  Abdominal:     Comments: RUQ drain to gravity bag. Scant amount of rust colored fluid/debris in bag. Drain easily flushed with 10 ml NS. Dressing is clean and dry.   Neurological:     Mental Status: He is alert.    Imaging: CT HEAD WO CONTRAST (5MM)  Result Date: 12/28/2020 CLINICAL DATA:  Mental status change, unknown cause altered mental status. EXAM: CT HEAD WITHOUT CONTRAST TECHNIQUE: Contiguous axial images were obtained from the base of the skull through the vertex without intravenous contrast. COMPARISON:  None. FINDINGS: Brain: Normal anatomic configuration. There is moderate parenchymal volume loss, slightly advanced given the patient's age. Remote lacunar infarcts are noted within the left caudate nucleus, left insular cortex, and right thalamus. Periventricular white matter changes are present likely reflecting the sequela of small vessel ischemia. No abnormal intra or extra-axial mass lesion or fluid collection. No abnormal mass  effect or midline shift. No evidence of acute intracranial hemorrhage or infarct. Ventricular size is normal. Cerebellum unremarkable. Vascular: No asymmetric hyperdense vasculature at the skull base. Skull: Intact Sinuses/Orbits: Paranasal sinuses are clear. Orbits are unremarkable. Other: Mastoid air cells and middle ear cavities are clear. IMPRESSION: No acute intracranial abnormality. Advanced senescent change. Multiple remote infarcts as noted above. Electronically Signed   By: Fidela Salisbury MD   On: 12/28/2020 23:45   CT ABDOMEN PELVIS W CONTRAST  Result Date: 12/28/2020 CLINICAL DATA:  Abdominal pain, biliary obstruction suspected (Ped 0-18y) EXAM: CT ABDOMEN AND PELVIS WITH CONTRAST TECHNIQUE: Multidetector CT imaging of the abdomen and pelvis was performed using the standard protocol following bolus administration of intravenous contrast. CONTRAST:  189m OMNIPAQUE IOHEXOL 300 MG/ML  SOLN COMPARISON:  04/11/2020 FINDINGS: Lower chest: Small right pleural effusion noted at the visualized right lung base with right  basilar atelectasis. Mild left basilar atelectasis. Extensive multi-vessel coronary artery calcification with probable stenting of the left anterior descending coronary artery. Cardiac size is mildly enlarged. No pericardial effusion. Hepatobiliary: The gallbladder is distended, the gallbladder wall is thickened, and there is extensive pericholecystic inflammatory change in keeping with changes of acute cholecystitis. The extrahepatic bile duct is not dilated. There is no intrahepatic biliary ductal dilation. The liver is unremarkable. Pancreas: Unremarkable Spleen: Unremarkable Adrenals/Urinary Tract: Adrenal glands are unremarkable. Kidneys are normal, without renal calculi, focal lesion, or hydronephrosis. Bladder is unremarkable. Stomach/Bowel: The stomach, small bowel, and large bowel are unremarkable. The appendix is not visualized and is likely absent. No free intraperitoneal gas or  fluid. Vascular/Lymphatic: Mild aortoiliac atherosclerotic calcification. Advanced atherosclerotic calcification is seen within the visualized lower extremity arterial outflow. No aortic aneurysm. No pathologic adenopathy within the abdomen and pelvis. Reproductive: Prostate is unremarkable. Other: Tiny fat containing umbilical hernia.  Rectum unremarkable. Musculoskeletal: No acute bone abnormality. No lytic or blastic bone lesions are seen. IMPRESSION: Acute cholecystitis.  Surgical consultation is advised. Extensive coronary artery calcification.  Mild global cardiomegaly. Aortic Atherosclerosis (ICD10-I70.0). Electronically Signed   By: Fidela Salisbury MD   On: 12/28/2020 23:50   MR ABDOMEN MRCP WO CONTRAST  Result Date: 12/29/2020 CLINICAL DATA:  Biliary obstruction, choledocholithiasis, altered mental status, acute cholecystitis, status post percutaneous cholecystostomy EXAM: MRI ABDOMEN WITHOUT CONTRAST  (INCLUDING MRCP) TECHNIQUE: Multiplanar multisequence MR imaging of the abdomen was performed. Heavily T2-weighted images of the biliary and pancreatic ducts were obtained, and three-dimensional MRCP images were rendered by post processing. COMPARISON:  CT abdomen pelvis, 12/28/2020, percutaneous cholecystostomy, 12/29/2020 FINDINGS: Lower chest: Small bilateral pleural effusions and associated atelectasis or consolidation. Cardiomegaly. Hepatobiliary: Mild hepatic steatosis. No mass or other parenchymal abnormality identified. The gallbladder is distended, with wall thickening and pericholecystic fluid, containing numerous tiny gallstones. Percutaneous cholecystostomy tube is in place, with formed pigtail in the gallbladder fundus. There is a small calculus in the distal common bile duct measuring 4 mm (series 8, image 27). No biliary ductal dilatation. Mild periportal edema. Pancreas: No mass, inflammatory changes, or other parenchymal abnormality identified. Spleen:  Splenomegaly, maximum coronal span  15.5 cm. Adrenals/Urinary Tract: No masses identified. No evidence of hydronephrosis. Stomach/Bowel: Visualized portions within the abdomen are unremarkable. Vascular/Lymphatic: No pathologically enlarged lymph nodes identified. No abdominal aortic aneurysm demonstrated. Other:  Small volume perihepatic ascites. Musculoskeletal: No suspicious bone lesions identified. IMPRESSION: 1. Percutaneous cholecystostomy tube is in place, with formed pigtail in the gallbladder fundus. 2. The gallbladder is distended, with wall thickening and pericholecystic fluid, containing numerous tiny gallstones. Findings are consistent with acute cholecystitis. 3. There is a 4 mm calculus in the distal common bile duct within the pancreatic head. No biliary ductal dilatation. 4. Mild hepatic steatosis. 5. Splenomegaly. 6. Small volume perihepatic ascites. 7. Small bilateral pleural effusions and associated atelectasis or consolidation. 8. Cardiomegaly. Electronically Signed   By: Eddie Candle M.D.   On: 12/29/2020 13:40   IR Perc Cholecystostomy  Result Date: 12/29/2020 INDICATION: 65 year old male with acute cholecystitis EXAM: ULTRASOUND AND FLUOROSCOPIC-GUIDED CHOLECYSTOSTOMY TUBE PLACEMENT COMPARISON:  CT Abdomen Pelvis, 12/28/2020. MEDICATIONS: The patient is currently admitted to the hospital and on intravenous antibiotics. Antibiotics were administered within an appropriate time frame prior to skin puncture. ANESTHESIA/SEDATION: Moderate (conscious) sedation was employed during this procedure. A total of Versed 1.5 mg and Fentanyl 75 mcg was administered intravenously. Moderate Sedation Time: 26 minutes. The patient's level of consciousness and vital signs were monitored  continuously by radiology nursing throughout the procedure under my direct supervision. CONTRAST:  19m OMNIPAQUE IOHEXOL 300 MG/ML SOLN - administered into the gallbladder fossa. FLUOROSCOPY TIME:  1 minutes 0 seconds (9 mGy) COMPLICATIONS: None immediate.  PROCEDURE: Informed written consent was obtained from the patient and/or patient's representative after a discussion of the risks, benefits and alternatives to treatment. Questions regarding the procedure were encouraged and answered. A timeout was performed prior to the initiation of the procedure. The right upper abdominal quadrant was prepped and draped in the usual sterile fashion, and a sterile drape was applied covering the operative field. Maximum barrier sterile technique with sterile gowns and gloves were used for the procedure. A timeout was performed prior to the initiation of the procedure. Local anesthesia was provided with 1% lidocaine with epinephrine. Ultrasound scanning of the right upper quadrant demonstrates a markedly dilated gallbladder. Utilizing a transhepatic approach, a 22 gauge needle was advanced into the gallbladder under direct ultrasound guidance. An ultrasound image was saved for documentation purposes. Appropriate intraluminal puncture was confirmed with the efflux of bile and advancement of an 0.018 wire into the gallbladder lumen. The needle was exchanged for an ATyonekset. A small amount of contrast was injected to confirm appropriate intraluminal positioning. Over a [name] wire, a 1792-French Cook cholecystomy tube was advanced into the gallbladder fossa, coiled and locked. Bile was aspirated and a small amount of contrast was injected as several post procedural spot radiographic images were obtained in various obliquities. The catheter was secured to the skin with suture, connected to a drainage bag and a dressing was placed. The patient tolerated the procedure well without immediate post procedural complication. IMPRESSION: Successful placement of a 10.2 French cholecystostomy tube, as above. JMichaelle Birks MD [facility] Electronically Signed   By: JMichaelle BirksMD   On: 12/29/2020 16:50   DG Chest Port 1  View  Result Date: 01/01/2021 CLINICAL DATA:  Hypoxia. EXAM: PORTABLE CHEST 1 VIEW COMPARISON:  A 08/2020 FINDINGS: Right lower lobe consolidation and right effusion unchanged. Mild left lower lobe atelectasis. Negative for heart failure or edema. IMPRESSION: Right lower lobe consolidation and right effusion unchanged. Electronically Signed   By: CFranchot GalloM.D.   On: 01/01/2021 08:05   DG CHEST PORT 1 VIEW  Result Date: 12/31/2020 CLINICAL DATA:  Hypoxia. EXAM: PORTABLE CHEST 1 VIEW COMPARISON:  12/31/2020. FINDINGS: Cardiomegaly. No pulmonary venous congestion. Interim decrease opacification right hemithorax with interim visualization of the right upper lung. Findings consistent with partial resolution of large right pleural effusion. Large right pleural effusion remains. Underlying pulmonary disease cannot be excluded. No pneumothorax. IMPRESSION: 1. Findings consistent with interim partial resolution of large right pleural effusion. Large right pleural effusion remains. Underlying right lung disease cannot be excluded. 2.  Cardiomegaly.  No pulmonary venous congestion. Electronically Signed   By: TMarcello Moores Register   On: 12/31/2020 06:07   DG Chest Port 1 View  Result Date: 12/31/2020 CLINICAL DATA:  Acute respiratory distress. EXAM: PORTABLE CHEST 1 VIEW COMPARISON:  Chest radiograph dated 12/29/2020. FINDINGS: There is complete opacification of the right imaged thorax with slight shift of the mediastinum to the right of the midline suggestive of volume loss. This is new since the prior radiograph and may represent atelectasis, infiltrate, or combination of pleural effusion and associated atelectasis/infiltrate. Occlusion of the central airway is not excluded. Linear density at the left lung base as seen on the prior radiograph. No pneumothorax. No acute osseous  pathology. IMPRESSION: Complete opacification of the right imaged thorax with slight shift of the mediastinum to the right of the midline  suggestive of volume loss. Clinical correlation recommended. These results were called by telephone at the time of interpretation on 12/31/2020 at 2:10 am to nurse Riley Churches , who verbally acknowledged these results. Electronically Signed   By: Anner Crete M.D.   On: 12/31/2020 02:18   DG CHEST PORT 1 VIEW  Result Date: 12/29/2020 CLINICAL DATA:  Hypoxia EXAM: PORTABLE CHEST 1 VIEW COMPARISON:  Chest x-ray 12/28/2020, CT abdomen pelvis 12/28/2020 FINDINGS: Enlarged cardiac silhouette. The heart size and mediastinal contours are unchanged. Low lung volumes with bibasilar streaky airspace opacities likely representing atelectasis. Elevated right hemidiaphragm no focal consolidation. No pulmonary edema. Trace right pleural effusion. No left pleural effusion. No pneumothorax. No acute osseous abnormality. IMPRESSION: 1. Trace right pleural effusion. 2. Low lung volumes. 3. Elevated right hemidiaphragm. Electronically Signed   By: Iven Finn M.D.   On: 12/29/2020 03:59   DG Chest Portable 1 View  Result Date: 12/28/2020 CLINICAL DATA:  Tachycardia. EXAM: PORTABLE CHEST 1 VIEW COMPARISON:  04/11/2020 FINDINGS: Low lung volumes persist. Stable heart size, likely accentuated by technique and low lung volumes. Unchanged mediastinal contours. Streaky bibasilar atelectasis or scarring. No pulmonary edema, pleural effusion or pneumothorax. Patient's chin partially obscures the right lung apex. No acute osseous abnormalities are seen. IMPRESSION: Low lung volumes with streaky bibasilar atelectasis or scarring. Electronically Signed   By: Keith Rake M.D.   On: 12/28/2020 22:58   DG ERCP  Result Date: 12/31/2020 CLINICAL DATA:  65 year old male undergoing ERCP for choledocholithiasis EXAM: ERCP TECHNIQUE: Multiple spot images obtained with the fluoroscopic device and submitted for interpretation post-procedure. FLUOROSCOPY TIME:  Fluoroscopy Time:  3 minutes 55 seconds Radiation Exposure Index (if  provided by the fluoroscopic device): 182.4 Number of Acquired Spot Images: 0 COMPARISON:  MRCP 12/29/2020 FINDINGS: A total of 14 intraoperative saved images are submitted for review. The images demonstrate a flexible duodenal scope in the descending duodenum. A percutaneous cholecystostomy tube is present. Subsequent images demonstrate cannulation of the common bile duct. Cholangiogram demonstrates a small filling defect in the distal common bile duct consistent with choledocholithiasis. The filling defect is no longer present on the final images consistent with removal. IMPRESSION: 1. Choledocholithiasis. 2. Percutaneous cholecystostomy tube in place. 3. ERCP as above. These images were submitted for radiologic interpretation only. Please see the procedural report for the amount of contrast and the fluoroscopy time utilized. Electronically Signed   By: Jacqulynn Cadet M.D.   On: 12/31/2020 08:41   US Abdomen Limited RUQ (LIVER/GB)  Result Date: 12/28/2020 CLINICAL DATA:  Abdominal pain. EXAM: ULTRASOUND ABDOMEN LIMITED RIGHT UPPER QUADRANT COMPARISON:  CT abdomen pelvis 04/11/2020 FINDINGS: Gallbladder: Abnormal gallbladder markedly limited evaluation - question of a heterogeneous structure distending the gallbladder. Common bile duct: Diameter: 4 mm Liver: Markedly limited evaluation. Unable to evaluate for focal hepatic lesion. Normal parenchymal echogenicity. Portal vein is patent on color Doppler imaging with normal direction of blood flow towards the liver. Other: None. IMPRESSION: 1. Abnormal gallbladder with markedly limited/nondiagnostic evaluation - question of a heterogeneous structure distending the gallbladder. Recommend CT abdomen pelvis with intravenous contrast for further evaluation. 2. Markedly limited evaluation of the liver due to decreased penetration of the sonographic waves. Electronically Signed   By: Iven Finn M.D.   On: 12/28/2020 22:45    Labs:  CBC: Recent Labs     12/29/20 0340 12/30/20  [id number] 12/31/20 0641 01/01/21 0446 01/01/21 0723  WBC 16.7* 9.3 13.1*  --  11.5*  HGB 11.3* 11.3* 9.9* 10.5* 9.9*  HCT 35.5* 36.2* 33.9* 31.0* 32.2*  PLT 165 142* 142*  --  153    COAGS: Recent Labs    12/29/20 0340 12/30/20 0032 12/31/20 0641 01/01/21 0723  INR 1.3* 1.4* 1.4* 1.3*    BMP: Recent Labs    01/16/20 1529 01/31/20 1129 03/12/20 1202 03/26/20 1137 12/28/20 2141 12/29/20 0340 12/30/20 0032 12/31/20 0641 01/01/21 0446 01/01/21 0723  NA 138 136 136 137   < > 135 138 138 140 139  K 3.7 3.9 5.0 5.0   < > 3.5 3.4* 4.1 3.1* 3.1*  CL 90* 90* 93* 94*   < > 94* 96* 99  --  100  CO2 30* '27 27 23   '$ < > '30 29 26  '$ --  28  GLUCOSE 143* 167* 130* 184*   < > 207* 181* 238*  --  208*  BUN 42* 34* 31* 59*   < > 28* 28* 35*  --  43*  CALCIUM 7.0* 7.7* 8.8 9.3   < > 8.8* 9.1 8.8*  --  8.7*  CREATININE 2.20* 1.74* 1.70* 1.57*   < > 1.77* 1.71* 2.13*  --  2.08*  GFRNONAA 30* 40* 41* 46*   < > 42* 44* 34*  --  34*  GFRAA 35* 47* 48* 53*  --   --   --   --   --   --    < > = values in this interval not displayed.    LIVER FUNCTION TESTS: Recent Labs    12/29/20 0340 12/30/20 0032 12/31/20 0641 01/01/21 0723  BILITOT 4.6* 4.4* 4.7* 3.7*  AST 48* 54* 67* 98*  ALT '23 26 28 '$ 38  ALKPHOS 244* 200* 212* 234*  PROT 6.7 6.7 6.2* 6.0*  ALBUMIN 1.7* 1.8* 1.5* 1.6*    Assessment and Plan:  Acute cholecystitis s/p percutaneous cholecystostomy 12/29/20  Patient is afebrile with WBC trending down (11.5) LFTs trending up - AST/ ALT 98/38 today. 45 ml drain output documented in Epic. Scant material in the gravity bag.   He is s/p ERCP 12/30/20 with successful stone removal. IR recommends drain to stay in a minimum of 6 weeks. Drain removal timing per Surgical team.   IR recommends to continue flushing drain and documenting the output once per shift. Change the dressing daily or as needed. Keep the site clean and dry.   Other plans per primary teams. IR will  continue to follow.   Electronically Signed: Soyla Dryer, AGACNP-BC [phone] 01/01/2021, 3:32 PM   I spent a total of 15 Minutes at the the patient's bedside AND on the patient's hospital floor or unit, greater than 50% of which was counseling/coordinating care for percutaneous cholecystostomy

## 2021-01-01 NOTE — Progress Notes (Addendum)
Progress Note  Patient Name: Matthew Gutierrez Date of Encounter: 01/01/2021  Primary Cardiologist: Jenne Campus, MD  Subjective   Remains confused this AM, awake but slow to respond. Cannot say where he is, thought it was July. No CP or SOB. Reverted to NSR yesterday evening (19:28).  Inpatient Medications    Scheduled Meds:  Chlorhexidine Gluconate Cloth  6 each Topical Daily   collagenase   Topical Daily   gabapentin  300 mg Oral QHS   guaiFENesin  5 mL Oral BID   insulin aspart  0-9 Units Subcutaneous Q4H   phenytoin (DILANTIN) IV  100 mg Intravenous Q12H   sodium chloride flush  5 mL Intracatheter Q12H   sodium chloride HYPERTONIC  4 mL Nebulization Daily   Continuous Infusions:  cefTRIAXone (ROCEPHIN)  IV Stopped (12/31/20 1309)   heparin 2,000 Units/hr (01/01/21 0602)   lactated ringers Stopped (12/31/20 0122)   potassium chloride     valproate sodium 53 mL/hr at 01/01/21 0600   PRN Meds: acetaminophen **OR** acetaminophen, albuterol, hydrALAZINE, HYDROmorphone (DILAUDID) injection, metoprolol tartrate, ondansetron **OR** ondansetron (ZOFRAN) IV, traZODone   Vital Signs    Vitals:   01/01/21 0800 01/01/21 0827 01/01/21 0900 01/01/21 1000  BP: (!) 153/67  (!) 152/73 (!) 152/74  Pulse: 64  64 63  Resp: 12  (!) 0 17  Temp:  (!) 97.5 F (36.4 C)    TempSrc:  Oral    SpO2: 99%  100% 100%  Weight:    116 kg  Height:    6' (1.829 m)    Intake/Output Summary (Last 24 hours) at 01/01/2021 1039 Last data filed at 01/01/2021 0926 Gross per 24 hour  Intake 1640.49 ml  Output 300 ml  Net 1340.49 ml   Last 3 Weights 01/01/2021 01/01/2021 12/30/2020  Weight (lbs) 255 lb 11.7 oz 255 lb 11.7 oz 289 lb 14.5 oz  Weight (kg) 116 kg 116 kg 131.5 kg     Telemetry    AF RVR -> NSR/SB 50-60s overnight, maintaining NSR - Personally Reviewed   Physical Exam   GEN: No acute distress, chronically ill appearing HEENT: Normocephalic, atraumatic, sclera non-icteric. Neck: No JVD  or bruits. Cardiac: RRR no murmurs, rubs, or gallops.  Respiratory: Decreased BS on the right, coarse BS n L, poor inspiratory effort, no wheezing or rhonchi, Breathing is unlabored. GI: Soft, nontender, non-distended, BS +x 4. MS: no deformity. Extremities: No clubbing or cyanosis. Boots in place, mild diffuse edema Neuro:  Awake, oriented to self only. Slow to respond to questions. Follows commands. Psych:  Not agitated  Labs    High Sensitivity Troponin:  No results for input(s): TROPONINIHS in the last 720 hours.    Cardiac EnzymesNo results for input(s): TROPONINI in the last 168 hours. No results for input(s): TROPIPOC in the last 168 hours.   Chemistry Recent Labs  Lab 12/30/20 0032 12/31/20 0641 01/01/21 0446 01/01/21 0723  NA 138 138 140 139  K 3.4* 4.1 3.1* 3.1*  CL 96* 99  --  100  CO2 29 26  --  28  GLUCOSE 181* 238*  --  208*  BUN 28* 35*  --  43*  CREATININE 1.71* 2.13*  --  2.08*  CALCIUM 9.1 8.8*  --  8.7*  PROT 6.7 6.2*  --  6.0*  ALBUMIN 1.8* 1.5*  --  1.6*  AST 54* 67*  --  98*  ALT 26 28  --  38  ALKPHOS 200* 212*  --  234*  BILITOT 4.4* 4.7*  --  3.7*  GFRNONAA 44* 34*  --  34*  ANIONGAP 13 13  --  11     Hematology Recent Labs  Lab 12/30/20 0032 12/31/20 0641 01/01/21 0446 01/01/21 0723  WBC 9.3 13.1*  --  11.5*  RBC 4.15* 3.73*  --  3.66*  HGB 11.3* 9.9* 10.5* 9.9*  HCT 36.2* 33.9* 31.0* 32.2*  MCV 87.2 90.9  --  88.0  MCH 27.2 26.5  --  27.0  MCHC 31.2 29.2*  --  30.7  RDW 16.2* 16.6*  --  17.0*  PLT 142* 142*  --  153    BNP Recent Labs  Lab 12/31/20 1449  BNP 374.1*     DDimer No results for input(s): DDIMER in the last 168 hours.   Radiology    DG Chest Port 1 View  Result Date: 01/01/2021 CLINICAL DATA:  Hypoxia. EXAM: PORTABLE CHEST 1 VIEW COMPARISON:  A 08/2020 FINDINGS: Right lower lobe consolidation and right effusion unchanged. Mild left lower lobe atelectasis. Negative for heart failure or edema. IMPRESSION: Right  lower lobe consolidation and right effusion unchanged. Electronically Signed   By: Franchot Gallo M.D.   On: 01/01/2021 08:05   DG CHEST PORT 1 VIEW  Result Date: 12/31/2020 CLINICAL DATA:  Hypoxia. EXAM: PORTABLE CHEST 1 VIEW COMPARISON:  12/31/2020. FINDINGS: Cardiomegaly. No pulmonary venous congestion. Interim decrease opacification right hemithorax with interim visualization of the right upper lung. Findings consistent with partial resolution of large right pleural effusion. Large right pleural effusion remains. Underlying pulmonary disease cannot be excluded. No pneumothorax. IMPRESSION: 1. Findings consistent with interim partial resolution of large right pleural effusion. Large right pleural effusion remains. Underlying right lung disease cannot be excluded. 2.  Cardiomegaly.  No pulmonary venous congestion. Electronically Signed   By: Marcello Moores  Register   On: 12/31/2020 06:07   DG Chest Port 1 View  Result Date: 12/31/2020 CLINICAL DATA:  Acute respiratory distress. EXAM: PORTABLE CHEST 1 VIEW COMPARISON:  Chest radiograph dated 12/29/2020. FINDINGS: There is complete opacification of the right imaged thorax with slight shift of the mediastinum to the right of the midline suggestive of volume loss. This is new since the prior radiograph and may represent atelectasis, infiltrate, or combination of pleural effusion and associated atelectasis/infiltrate. Occlusion of the central airway is not excluded. Linear density at the left lung base as seen on the prior radiograph. No pneumothorax. No acute osseous pathology. IMPRESSION: Complete opacification of the right imaged thorax with slight shift of the mediastinum to the right of the midline suggestive of volume loss. Clinical correlation recommended. These results were called by telephone at the time of interpretation on 12/31/2020 at 2:10 am to nurse Riley Churches , who verbally acknowledged these results. Electronically Signed   By: Anner Crete M.D.    On: 12/31/2020 02:18   DG ERCP  Result Date: 12/31/2020 CLINICAL DATA:  65 year old male undergoing ERCP for choledocholithiasis EXAM: ERCP TECHNIQUE: Multiple spot images obtained with the fluoroscopic device and submitted for interpretation post-procedure. FLUOROSCOPY TIME:  Fluoroscopy Time:  3 minutes 55 seconds Radiation Exposure Index (if provided by the fluoroscopic device): 182.4 Number of Acquired Spot Images: 0 COMPARISON:  MRCP 12/29/2020 FINDINGS: A total of 14 intraoperative saved images are submitted for review. The images demonstrate a flexible duodenal scope in the descending duodenum. A percutaneous cholecystostomy tube is present. Subsequent images demonstrate cannulation of the common bile duct. Cholangiogram demonstrates a small filling defect in the distal common bile  duct consistent with choledocholithiasis. The filling defect is no longer present on the final images consistent with removal. IMPRESSION: 1. Choledocholithiasis. 2. Percutaneous cholecystostomy tube in place. 3. ERCP as above. These images were submitted for radiologic interpretation only. Please see the procedural report for the amount of contrast and the fluoroscopy time utilized. Electronically Signed   By: Jacqulynn Cadet M.D.   On: 12/31/2020 08:41    Cardiac Studies   Pending echo  Patient Profile     65 y.o. male SNF pt with HTN, HLD, type 2 DM, epilepsy, CVA, COPD, paroxysmal A fib on coumadin, CKD III, OSA on CPAP, PVD, COPD, hx of DVT, chronic lymphedema, and obesity presented with multiple medical issues. Initially sepsis with biliary source, but decompensated 8/4 with hypoxemia, R lung atelectasis with volume loss and mediastinal shift to the right, suspected mucus plugging. Cardiology following for afib RVR.  Assessment & Plan    1. Paroxysmal atrial fib - likely driven by medical stressors, improved and reverted to NSR/SB overnight in the 50s and 60s so amiodarone stopped - also had oral  metoprolol ordered the last few days but had not been receiving regularly - will discuss plan this AM, potentially consider low dose metoprolol as HR will allow vs transition to oral amiodarone - TSH wnl - remains on heparin GTT, consider transition to [facility] when clinically stable (warfarin listed as a prior to admission med that he had not been taking, question whether Eliquis would be better option?) - 2D echo pending  2. Hx of HTN with hypotension this admission - multifactorial, SBPs now [id number] systolic   3. Multiple medical stressors this admission: sepsis, acute on chronic respiratory failure, toxic metabolic encephalopathy, AKI on CKD III - also with abnormal LFTs, severely low albumin, hypokalemia - per medical team  For questions or updates, please contact [facility] Please consult [email or web address] for contact info under Cardiology/STEMI.  Signed, Charlie Pitter, PA-C 01/01/2021, 10:39 AM    History and all data above reviewed.  Patient examined.  I agree with the findings as above.   Much more awake today although confused.  Not localizing any sympotmsThe patient exam reveals COR:RRR  ,  Lungs: Decreased breath sounds  ,  Abd: Positive bowel sounds, no rebound no guarding, Ext Diffuse mild edema  .  All available labs, radiology testing, previous records reviewed. Agree with documented assessment and plan. Atrial fib:  Off amiodarone.  I will write for low dose beta blocker VT.  Agree with heparin and then transition to [place] when able.    Jeneen Rinks [facility]  11:37 AM  01/01/2021

## 2021-01-01 NOTE — Evaluation (Signed)
Occupational Therapy Evaluation Patient Details Name: Matthew Gutierrez MRN: [id number] DOB: [birth year] Today's Date: 01/01/2021    History of Present Illness Matthew Gutierrez is a 65 y.o. male with medical history significant for DMT2, CKD 3, a-fib, COPD, neuropathy due to diabetes, history of stroke, OSA, PVD who presents by EMS from skilled nursing facility for evaluation of altered mental status with hypoxia.  Reportedly patient is normally conversational at his baseline. Patient is found to be febrile with a temperature of 101.4 degrees.  He is also found to be septic with elevated lactic acid, encephalopathy, tachycardia, tachypnea and imaging revealed acute cholecystitis.  He has elevated bilirubin level.  Surgery was consulted and recommended MRCP in light of his clinical situation.   Clinical Impression   Patient is LTC resident present for OT evaluation secondary to diagnosis above and problem list below. Patient is a questionable historian but reports facility staff utilized mechanical equipment for transfers. Patient reports ability to feed himself at baseline. Patient currently limited by deficits listed below including generalized weakness, decreased activity tolerance and need for Max to Total A +2 grossly for all bed mobility and bed level ADLs. OT to pick patient up 1x weekly to maximize independence with self-feeding and grooming tasks.     Follow Up Recommendations  SNF    Equipment Recommendations  None recommended by OT    Recommendations for Other Services       Precautions / Restrictions Precautions Precautions: Fall Precaution Comments: HOH Restrictions Weight Bearing Restrictions: No      Mobility Bed Mobility Overal bed mobility: Needs Assistance Bed Mobility: Rolling Rolling: Max assist;+2 for physical assistance         General bed mobility comments: Max A +2 to Total A +2 grossly.    Transfers                 General transfer comment: Deferred;  patient dependent on mechanical lift at baseline.    Balance Overall balance assessment: Needs assistance Sitting-balance support: Bilateral upper extremity supported Sitting balance-Leahy Scale: Zero Sitting balance - Comments: Unable to maintain balance in suppored sitting position.                                   ADL either performed or assessed with clinical judgement   ADL Overall ADL's : Needs assistance/impaired Eating/Feeding: NPO   Grooming: Moderate assistance                                 General ADL Comments: Grossly dependent     Vision   Vision Assessment?: No apparent visual deficits     Perception     Praxis      Pertinent Vitals/Pain Pain Assessment: Faces Faces Pain Scale: No hurt     Hand Dominance Right   Extremity/Trunk Assessment Upper Extremity Assessment Upper Extremity Assessment: RUE deficits/detail;LUE deficits/detail RUE Deficits / Details: Limited AROM/PROM at shoulder. Able to flex/extend elbow with increased time. Able to make a loose fist. RUE Sensation: WNL RUE Coordination: decreased fine motor;decreased gross motor LUE Deficits / Details: increased tone and weakness in L UE with contractures at wrist and elbow. LUE Coordination: decreased gross motor;decreased fine motor   Lower Extremity Assessment Lower Extremity Assessment: Defer to PT evaluation   Cervical / Trunk Assessment Cervical / Trunk Assessment: Kyphotic   Communication Communication Communication: No  difficulties   Cognition Arousal/Alertness: Awake/alert Behavior During Therapy: Flat affect Overall Cognitive Status: No family/caregiver present to determine baseline cognitive functioning                                 General Comments: Patient is a poor historian; states that he has been at his SNF for 5-6 years. Able to provide some PLOF including need for mechanical lift for transfers. Reports that it has been a  long time since he's stoodd. Slow to respond but able to answer therapists questions. Perseverates on his sisters visiting.   General Comments  VSS on 3L O2 via [place]    Exercises     Shoulder Instructions      Home Living Family/patient expects to be discharged to:: Skilled nursing facility                                        Prior Functioning/Environment Level of Independence: Needs assistance  Gait / Transfers Assistance Needed: Harrel Lemon lift to wheelchair; unable to self-propel wc at baseline ADL's / Homemaking Assistance Needed: Grossly Total A; reports ability to feed himself at baseline            OT Problem List: Decreased strength;Decreased range of motion;Decreased activity tolerance;Impaired balance (sitting and/or standing);Cardiopulmonary status limiting activity;Impaired UE functional use;Increased edema      OT Treatment/Interventions: Self-care/ADL training;Therapeutic exercise;Therapeutic activities;Patient/family education;Balance training    OT Goals(Current goals can be found in the care plan section) Acute Rehab OT Goals Patient Stated Goal: No goals stated. OT Goal Formulation: Patient unable to participate in goal setting Time For Goal Achievement: 01/15/21 Potential to Achieve Goals: Fair ADL Goals Pt Will Perform Eating: with set-up;sitting;bed level;with adaptive utensils Pt Will Perform Grooming: with min assist;sitting;bed level Additional ADL Goal #1: Patient will tolerate 1 set x 10 reps each of BUE HEP to improve strength in prep for ADLs.  OT Frequency: Min 1X/week   Barriers to D/C:            Co-evaluation              AM-PAC OT "6 Clicks" Daily Activity     Outcome Measure Help from another person eating meals?: Total Help from another person taking care of personal grooming?: A Lot Help from another person toileting, which includes using toliet, bedpan, or urinal?: Total Help from another person bathing  (including washing, rinsing, drying)?: Total Help from another person to put on and taking off regular upper body clothing?: Total Help from another person to put on and taking off regular lower body clothing?: Total 6 Click Score: 7   End of Session Equipment Utilized During Treatment: Oxygen Nurse Communication: Need for lift equipment [facility])  Activity Tolerance: Patient tolerated treatment well;Patient limited by fatigue Patient left: in bed;with call bell/phone within reach;with bed alarm set  OT Visit Diagnosis: Other abnormalities of gait and mobility (R26.89);Muscle weakness (generalized) (M62.81);Feeding difficulties (R63.3)                Time: [id number] OT Time Calculation (min): 26 min Charges:  OT General Charges $OT Visit: 1 Visit OT Evaluation $OT Eval Moderate Complexity: 1 Mod OT Treatments $Therapeutic Activity: 8-22 mins  Akira Perusse H. OTR/L Supplemental OT, Department of rehab services [phone]  Sakari Alkhatib R H. 01/01/2021, 11:20 AM

## 2021-01-01 NOTE — Progress Notes (Signed)
ANTICOAGULATION CONSULT NOTE - Follow Up Consult  Pharmacy Consult for heparin Indication:  Afib and h/o VTE  Labs: Recent Labs    12/29/20 0340 12/30/20 0032 12/31/20 0641 12/31/20 2224  HGB 11.3* 11.3* 9.9*  --   HCT 35.5* 36.2* 33.9*  --   PLT 165 142* 142*  --   LABPROT 16.1* 17.1* 17.4*  --   INR 1.3* 1.4* 1.4*  --   HEPARINUNFRC  --  <0.10*  --  0.12*  CREATININE 1.77* 1.71* 2.13*  --     Assessment: 65yo male subtherapeutic on heparin after resumed at higher rate; no infusion issues or signs of bleeding per RN.  Goal of Therapy:  Heparin level 0.3-0.7 units/ml   Plan:  Will increase heparin infusion by 4 units/kgABW/hr to 2000 units/hr and check level in 8 hours.    Wynona Neat, PharmD, BCPS  01/01/2021,12:02 AM

## 2021-01-01 NOTE — Progress Notes (Signed)
[place] for Heparin (warfarin on hold) Indication: Afib, history of DVT  Allergies  Allergen Reactions   Codeine Other (See Comments)    Unknown Reaction    Patient Measurements: Height: 6' (182.9 cm) Weight: 116 kg (255 lb 11.7 oz) IBW/kg (Calculated) : 77.6  Vital Signs: Temp: 97.7 F (36.5 C) (08/05 1952) Temp Source: Axillary (08/05 1952) BP: 123/58 (08/05 1800) Pulse Rate: 59 (08/05 1800)  Labs: Recent Labs    12/30/20 0032 12/31/20 0641 12/31/20 2224 01/01/21 0446 01/01/21 0723 01/01/21 1856  HGB 11.3* 9.9*  --  10.5* 9.9*  --   HCT 36.2* 33.9*  --  31.0* 32.2*  --   PLT 142* 142*  --   --  153  --   LABPROT 17.1* 17.4*  --   --  16.4*  --   INR 1.4* 1.4*  --   --  1.3*  --   HEPARINUNFRC <0.10*  --  0.12*  --  0.20* 0.33  CREATININE 1.71* 2.13*  --   --  2.08*  --      Estimated Creatinine Clearance: 46 mL/min (A) (by C-G formula based on SCr of 2.08 mg/dL (H)).   Medical History: Past Medical History:  Diagnosis Date   Absence epileptic syndrome, not intractable, without status epilepticus ([place]) 07/23/2019   Acute renal failure superimposed on stage 3 chronic kidney disease ([place]) 07/23/2019   Atherosclerotic heart disease 07/23/2019   Atrial fibrillation with RVR ([facility]) 12/14/2019   Benign enlargement of prostate 07/23/2019   Body mass index 50.0-59.9, adult ([place]) 07/23/2019   Cellulitis of leg 12/14/2019   Chronic respiratory failure with hypoxia ([place]) 07/23/2019   Chronic venous hypertension (idiopathic) with ulcer of left lower extremity ([place]) 07/23/2019   COPD (chronic obstructive pulmonary disease) ([place]) 07/23/2019   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   Diabetic neuropathy ([place]) 07/23/2019   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[place]) 10/15/2019   Epilepsy, grand mal ([place])    Essential hypertension 01/22/2018   Gout attack 10/06/2019   H/O: stroke 01/22/2018   History of DVT (deep vein thrombosis) 01/22/2018    Long term (current) use of anticoagulants 07/23/2019   Lumbago    Lymphedema of both lower extremities 01/22/2018   Mixed hyperlipidemia 07/23/2019   Morbid obesity ([place]) 07/23/2019   Obstructive sleep apnea 07/23/2019   Paroxysmal atrial fibrillation ([place]) 01/07/2020   Peripheral vascular disease, unspecified ([place]) 07/12/2011   Secondary hypercoagulable state ([place]) 01/07/2020   Shoulder bursitis 07/25/2019   Stroke [facility])     Assessment: 65 y/o M from SNF with hypoxemia. Unable to follow commands in the ED. Having some abdominal pain. On warfarin PTA for history of DVT and afib- of note, Nursing MAR reported as not taking (unclear why).  Underwent cholecystostomy tube placement 8/2 and ERCP on 8/3 with choledocholithiasis found, sphincterotomy performed. Minor bleeding at sphincterotomy site treated with epinephrine. Patient was on IV heparin prior and was held post procedure. Discussed with Dr. Shearon Stalls and will resume heparin.   Patient is therapeutic with heparin running @ 2200 u/h (heparin level 0.33). H/H stable, PLT WNL, no signs of bleeding noted.    Goal of Therapy:  Heparin level 0.3-0.7 units/mL Monitor platelets by anticoagulation protocol: Yes   Plan:  Continue heparin at 2,200 units/h  Will check confirmatory heparin level with am labs F/U restart of warfarin  Monitor heparin level, CBC and s/s of bleeding.   Alanda Slim, PharmD, [facility] Clinical Pharmacist Please see AMION for all Pharmacists' Contact  Phone Numbers 01/01/2021, 8:05 PM

## 2021-01-01 NOTE — Progress Notes (Signed)
Inpatient Diabetes Program Recommendations  AACE/ADA: New Consensus Statement on Inpatient Glycemic Control (2015)  Target Ranges:  Prepandial:   less than 140 mg/dL      Peak postprandial:   less than 180 mg/dL (1-2 hours)      Critically ill patients:  140 - 180 mg/dL   Lab Results  Component Value Date   GLUCAP 198 (H) 01/01/2021   HGBA1C 8.6 (H) 12/29/2020    Review of Glycemic Control Results for Matthew Gutierrez, Matthew Gutierrez (MRN [id number]) as of 01/01/2021 09:40  Ref. Range 12/31/2020 17:05 12/31/2020 22:13 01/01/2021 04:24 01/01/2021 08:25  Glucose-Capillary Latest Ref Range: 70 - 99 mg/dL 210 (H) 223 (H) 186 (H) 198 (H)   Diabetes history: Type 2 DM Outpatient Diabetes medications: Trulicity [id number] mg Qwk, Metformin 1000 mg BID (NT) Current orders for Inpatient glycemic control: Novolog 0-9 units Q6H PRN Decadron 5 mg x 1 8/3  Inpatient Diabetes Program Recommendations:    Consider changing correction to Novolog 0-9 Q4H instead of PRN. Secure chat sent to NP.   Thanks, Bronson Curb, MSN, RNC-OB Diabetes Coordinator [phone] (8a-5p)

## 2021-01-01 NOTE — Evaluation (Signed)
Clinical/Bedside Swallow Evaluation Patient Details  Name: Matthew Gutierrez MRN: [id number] Date of Birth: 01/23/1956  Today's Date: 01/01/2021 Time: SLP Start Time (ACUTE ONLY): [id number] SLP Stop Time (ACUTE ONLY): 1600 SLP Time Calculation (min) (ACUTE ONLY): 15 min  Past Medical History:  Past Medical History:  Diagnosis Date   Absence epileptic syndrome, not intractable, without status epilepticus ([place]) 07/23/2019   Acute renal failure superimposed on stage 3 chronic kidney disease ([place]) 07/23/2019   Atherosclerotic heart disease 07/23/2019   Atrial fibrillation with RVR ([place]) 12/14/2019   Benign enlargement of prostate 07/23/2019   Body mass index 50.0-59.9, adult ([place]) 07/23/2019   Cellulitis of leg 12/14/2019   Chronic respiratory failure with hypoxia ([place]) 07/23/2019   Chronic venous hypertension (idiopathic) with ulcer of left lower extremity ([place]) 07/23/2019   COPD (chronic obstructive pulmonary disease) ([place]) 07/23/2019   Diabetic glomerulopathy ([place]) 10/15/2019   Diabetic neuropathy ([place]) 07/23/2019   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[place]) 10/15/2019   Epilepsy, grand mal ([place])    Essential hypertension 01/22/2018   Gout attack 10/06/2019   H/O: stroke 01/22/2018   History of DVT (deep vein thrombosis) 01/22/2018   Long term (current) use of anticoagulants 07/23/2019   Lumbago    Lymphedema of both lower extremities 01/22/2018   Mixed hyperlipidemia 07/23/2019   Morbid obesity ([place]) 07/23/2019   Obstructive sleep apnea 07/23/2019   Paroxysmal atrial fibrillation ([place]) 01/07/2020   Peripheral vascular disease, unspecified ([place]) 07/12/2011   Secondary hypercoagulable state ([place]) 01/07/2020   Shoulder bursitis 07/25/2019   Stroke [facility])    Past Surgical History:  Past Surgical History:  Procedure Laterality Date   ERCP N/A 12/30/2020   Procedure: ENDOSCOPIC RETROGRADE CHOLANGIOPANCREATOGRAPHY (ERCP);  Surgeon: Ladene Artist, MD;  Location: [facility] ENDOSCOPY;  Service: Endoscopy;   Laterality: N/A;   IR PERC CHOLECYSTOSTOMY  12/29/2020   REMOVAL OF STONES  12/30/2020   Procedure: REMOVAL OF STONES;  Surgeon: Ladene Artist, MD;  Location: [place];  Service: Endoscopy;;   SPHINCTEROTOMY  12/30/2020   Procedure: Joan Mayans;  Surgeon: Ladene Artist, MD;  Location: [facility] ENDOSCOPY;  Service: Endoscopy;;   HPI:  65 year old who lives at SNF with multiple chronic medical issues admitted with ascending cholangitis. Underwent ERCP on 8/3. Acute on chronic hypoxemic and hypercarbic respiratory failure: In the setting of left-sided profound weakness, poor cough mechanics and likely mucous plug leading to complete atelectasis of right lung on chest x-ray.  Underlying OSA.  ABG with significant hypoxemia and hypercarbia. Suspected Aspiration event during ERCP   Assessment / Plan / Recommendation Clinical Impression  Pt demonstrates no signs of aspiration with 8 oz of water. He is able to attend to PO appropriately despite confusion. Pt has very poor baseline dentition. Will start with mechanical soft solids and pt can advance to regular solids if well tolerated. Will f/u. SLP Visit Diagnosis: Dysphagia, unspecified (R13.10)    Aspiration Risk  Mild aspiration risk    Diet Recommendation Dysphagia 3 (Mech soft);Thin liquid   Liquid Administration via: Cup;Straw Medication Administration: Whole meds with liquid Supervision: Patient able to self feed Compensations: Slow rate;Small sips/bites Postural Changes: Seated upright at 90 degrees    Other  Recommendations Oral Care Recommendations: Oral care BID   Follow up Recommendations Skilled Nursing facility      Frequency and Duration min 2x/week  1 week       Prognosis Prognosis for Safe Diet Advancement: Good      Swallow Study   General HPI: 65 year old who lives at  SNF with multiple chronic medical issues admitted with ascending cholangitis. Underwent ERCP on 8/3. Acute on chronic hypoxemic and  hypercarbic respiratory failure: In the setting of left-sided profound weakness, poor cough mechanics and likely mucous plug leading to complete atelectasis of right lung on chest x-ray.  Underlying OSA.  ABG with significant hypoxemia and hypercarbia. Suspected Aspiration event during ERCP Type of Study: Bedside Swallow Evaluation Previous Swallow Assessment: none Diet Prior to this Study: NPO Temperature Spikes Noted: No Respiratory Status: Room air History of Recent Intubation: Yes (for procedure on 8/3) Length of Intubations (days):  (less than on day) Date extubated: 12/30/20 Behavior/Cognition: Requires cueing;Distractible;Confused Oral Cavity Assessment: Within Functional Limits Oral Care Completed by SLP: No Oral Cavity - Dentition: Poor condition Vision: Functional for self-feeding Self-Feeding Abilities: Needs assist Patient Positioning: Upright in bed Baseline Vocal Quality: Normal Volitional Cough: Strong Volitional Swallow: Able to elicit    Oral/Motor/Sensory Function Overall Oral Motor/Sensory Function: Within functional limits   Ice Chips     Thin Liquid Thin Liquid: Within functional limits    Nectar Thick Nectar Thick Liquid: Not tested   Honey Thick     Puree Puree: Within functional limits   Solid     Solid: Within functional limits      Loris Seelye, Katherene Ponto 01/01/2021,4:10 PM

## 2021-01-01 NOTE — Progress Notes (Signed)
  Echocardiogram 2D Echocardiogram has been performed.  Matthew Gutierrez 01/01/2021, 2:37 PM

## 2021-01-02 ENCOUNTER — Inpatient Hospital Stay (HOSPITAL_COMMUNITY): Payer: No Typology Code available for payment source

## 2021-01-02 DIAGNOSIS — K810 Acute cholecystitis: Secondary | ICD-10-CM | POA: Diagnosis not present

## 2021-01-02 LAB — COMPREHENSIVE METABOLIC PANEL
ALT: 41 U/L (ref 0–44)
AST: 89 U/L — ABNORMAL HIGH (ref 15–41)
Albumin: 1.7 g/dL — ABNORMAL LOW (ref 3.5–5.0)
Alkaline Phosphatase: 267 U/L — ABNORMAL HIGH (ref 38–126)
Anion gap: 10 (ref 5–15)
BUN: 40 mg/dL — ABNORMAL HIGH (ref 8–23)
CO2: 30 mmol/L (ref 22–32)
Calcium: 8.9 mg/dL (ref 8.9–10.3)
Chloride: 99 mmol/L (ref 98–111)
Creatinine, Ser: 2.06 mg/dL — ABNORMAL HIGH (ref 0.61–1.24)
GFR, Estimated: 35 mL/min — ABNORMAL LOW (ref >60)
Glucose, Bld: 129 mg/dL — ABNORMAL HIGH (ref 70–99)
Potassium: 3.2 mmol/L — ABNORMAL LOW (ref 3.5–5.1)
Sodium: 139 mmol/L (ref 135–145)
Total Bilirubin: 3.9 mg/dL — ABNORMAL HIGH (ref 0.3–1.2)
Total Protein: 6.3 g/dL — ABNORMAL LOW (ref 6.5–8.1)

## 2021-01-02 LAB — GLUCOSE, CAPILLARY
Glucose-Capillary: 115 mg/dL — ABNORMAL HIGH (ref 70–99)
Glucose-Capillary: 124 mg/dL — ABNORMAL HIGH (ref 70–99)
Glucose-Capillary: 142 mg/dL — ABNORMAL HIGH (ref 70–99)
Glucose-Capillary: 152 mg/dL — ABNORMAL HIGH (ref 70–99)
Glucose-Capillary: 155 mg/dL — ABNORMAL HIGH (ref 70–99)

## 2021-01-02 LAB — MAGNESIUM: Magnesium: 2.3 mg/dL (ref 1.7–2.4)

## 2021-01-02 LAB — CBC
HCT: 32.4 % — ABNORMAL LOW (ref 39.0–52.0)
Hemoglobin: 9.8 g/dL — ABNORMAL LOW (ref 13.0–17.0)
MCH: 26.7 pg (ref 26.0–34.0)
MCHC: 30.2 g/dL (ref 30.0–36.0)
MCV: 88.3 fL (ref 80.0–100.0)
Platelets: 149 10*3/uL — ABNORMAL LOW (ref 150–400)
RBC: 3.67 MIL/uL — ABNORMAL LOW (ref 4.22–5.81)
RDW: 17.3 % — ABNORMAL HIGH (ref 11.5–15.5)
WBC: 10.1 10*3/uL (ref 4.0–10.5)
nRBC: 0 % (ref 0.0–0.2)

## 2021-01-02 LAB — HEPARIN LEVEL (UNFRACTIONATED): Heparin Unfractionated: 0.62 IU/mL (ref 0.30–0.70)

## 2021-01-02 LAB — PROTIME-INR
INR: 1.3 — ABNORMAL HIGH (ref 0.8–1.2)
Prothrombin Time: 15.9 seconds — ABNORMAL HIGH (ref 11.4–15.2)

## 2021-01-02 MED ORDER — METOPROLOL TARTRATE 25 MG PO TABS: 25.0000 mg | ORAL_TABLET | Freq: Two times a day (BID) | ORAL | Status: DC

## 2021-01-02 MED ORDER — POTASSIUM CHLORIDE CRYS ER 20 MEQ PO TBCR
40.0000 meq | EXTENDED_RELEASE_TABLET | Freq: Once | ORAL | Status: AC
  Administered 2021-01-02: 40 meq via ORAL
  Filled 2021-01-02: qty 2

## 2021-01-02 MED ORDER — METOPROLOL TARTRATE 12.5 MG HALF TABLET: 12.5000 mg | ORAL_TABLET | Freq: Two times a day (BID) | ORAL | Status: DC

## 2021-01-02 MED ORDER — FENTANYL CITRATE (PF) 100 MCG/2ML IJ SOLN: 100.0000 ug | INTRAMUSCULAR | Status: DC

## 2021-01-02 MED ORDER — METOPROLOL TARTRATE 12.5 MG HALF TABLET
12.5000 mg | ORAL_TABLET | Freq: Two times a day (BID) | ORAL | Status: DC
  Administered 2021-01-02: 12.5 mg via ORAL
  Filled 2021-01-02 (×2): qty 1

## 2021-01-02 NOTE — Progress Notes (Signed)
Progress Note  Patient Name: Matthew Gutierrez Date of Encounter: 01/02/2021  Primary Cardiologist: Jenne Campus, MD  Subjective   Remains confused this AM, awake but slow to respond. Cannot say where he is, thought it was July. No CP or SOB. Reverted to NSR yesterday evening (19:28).  Inpatient Medications    Scheduled Meds:  Chlorhexidine Gluconate Cloth  6 each Topical Daily   collagenase   Topical Daily   divalproex  125 mg Oral Q12H   And   divalproex  500 mg Oral Q12H   gabapentin  300 mg Oral QHS   guaiFENesin  5 mL Oral BID   insulin aspart  0-9 Units Subcutaneous Q4H   metoprolol tartrate  12.5 mg Oral BID   phenytoin (DILANTIN) IV  100 mg Intravenous Q12H   sodium chloride flush  5 mL Intracatheter Q12H   sodium chloride HYPERTONIC  4 mL Nebulization Daily   Continuous Infusions:  cefTRIAXone (ROCEPHIN)  IV 2 g (01/02/21 1153)   heparin 2,100 Units/hr (01/02/21 1100)   PRN Meds: acetaminophen **OR** acetaminophen, albuterol, hydrALAZINE, HYDROmorphone (DILAUDID) injection, metoprolol tartrate, ondansetron **OR** ondansetron (ZOFRAN) IV, traZODone   Vital Signs    Vitals:   01/02/21 1100 01/02/21 1130 01/02/21 1139 01/02/21 1200  BP: (!) 132/52   130/67  Pulse: 62 71  66  Resp: 20 (!) 22  17  Temp:   97.8 F (36.6 C)   TempSrc:   Axillary   SpO2: 97% 97%  97%  Weight:      Height:        Intake/Output Summary (Last 24 hours) at 01/02/2021 1225 Last data filed at 01/02/2021 1100 Gross per 24 hour  Intake 1248.31 ml  Output 2900 ml  Net -1651.69 ml   Last 3 Weights 01/01/2021 01/01/2021 12/30/2020  Weight (lbs) 255 lb 11.7 oz 255 lb 11.7 oz 289 lb 14.5 oz  Weight (kg) 116 kg 116 kg 131.5 kg     Telemetry    AF RVR -> NSR/SB 50-60s overnight, maintaining NSR - Personally Reviewed   Physical Exam   GEN: No acute distress, chronically ill appearing HEENT: Normocephalic, atraumatic, sclera non-icteric. Neck: No JVD or bruits. Cardiac: RRR no murmurs,  rubs, or gallops.  Respiratory: Decreased BS on the right, coarse BS n L, poor inspiratory effort, no wheezing or rhonchi, Breathing is unlabored. GI: Soft, nontender, non-distended, BS +x 4. MS: no deformity. Extremities: No clubbing or cyanosis. Boots in place, mild diffuse edema Neuro:  Awake, oriented to self only. Slow to respond to questions. Follows commands. Psych:  Not agitated  Labs    High Sensitivity Troponin:  No results for input(s): TROPONINIHS in the last 720 hours.    Cardiac EnzymesNo results for input(s): TROPONINI in the last 168 hours. No results for input(s): TROPIPOC in the last 168 hours.   Chemistry Recent Labs  Lab 12/31/20 0641 01/01/21 0446 01/01/21 0723 01/02/21 0406  NA 138 140 139 139  K 4.1 3.1* 3.1* 3.2*  CL 99  --  100 99  CO2 26  --  28 30  GLUCOSE 238*  --  208* 129*  BUN 35*  --  43* 40*  CREATININE 2.13*  --  2.08* 2.06*  CALCIUM 8.8*  --  8.7* 8.9  PROT 6.2*  --  6.0* 6.3*  ALBUMIN 1.5*  --  1.6* 1.7*  AST 67*  --  98* 89*  ALT 28  --  38 41  ALKPHOS 212*  --  234*  267*  BILITOT 4.7*  --  3.7* 3.9*  GFRNONAA 34*  --  34* 35*  ANIONGAP 13  --  11 10     Hematology Recent Labs  Lab 12/31/20 0641 01/01/21 0446 01/01/21 0723 01/02/21 0406  WBC 13.1*  --  11.5* 10.1  RBC 3.73*  --  3.66* 3.67*  HGB 9.9* 10.5* 9.9* 9.8*  HCT 33.9* 31.0* 32.2* 32.4*  MCV 90.9  --  88.0 88.3  MCH 26.5  --  27.0 26.7  MCHC 29.2*  --  30.7 30.2  RDW 16.6*  --  17.0* 17.3*  PLT 142*  --  153 149*    BNP Recent Labs  Lab 12/31/20 1449  BNP 374.1*     DDimer No results for input(s): DDIMER in the last 168 hours.   Radiology    DG CHEST PORT 1 VIEW  Result Date: 01/02/2021 CLINICAL DATA:  65 year old male with history of hypoxemia. EXAM: PORTABLE CHEST 1 VIEW COMPARISON:  Chest x-ray 01/01/2021. FINDINGS: Near complete opacification of the right hemithorax. Left basilar opacity which may reflect atelectasis and/or consolidation. Small left  pleural effusion. No pneumothorax. Heart size is mildly enlarged. Upper mediastinal contours are distorted by patient positioning. IMPRESSION: 1. Dramatic worsening aeration in the right lung concerning for enlarging large right pleural effusion with worsening atelectasis and/or consolidation in the underlying lung. 2. Small amount of atelectasis and/or consolidation in the left lower lobe with small left pleural effusion. Electronically Signed   By: Vinnie Langton M.D.   On: 01/02/2021 08:31   DG Chest Port 1 View  Result Date: 01/01/2021 CLINICAL DATA:  Hypoxia. EXAM: PORTABLE CHEST 1 VIEW COMPARISON:  A 08/2020 FINDINGS: Right lower lobe consolidation and right effusion unchanged. Mild left lower lobe atelectasis. Negative for heart failure or edema. IMPRESSION: Right lower lobe consolidation and right effusion unchanged. Electronically Signed   By: Franchot Gallo M.D.   On: 01/01/2021 08:05   ECHOCARDIOGRAM COMPLETE  Result Date: 01/01/2021    ECHOCARDIOGRAM REPORT   Patient Name:   Matthew Gutierrez Date of Exam: 01/01/2021 Medical Rec #:  [id number]      Height:       72.0 in Accession #:    [id number]     Weight:       255.7 lb Date of Birth:  1956-01-02      BSA:          2.365 m Patient Age:    65 years       BP:           138/64 mmHg Patient Gender: M              HR:           64 bpm. Exam Location:  Inpatient Procedure: 2D Echo, Cardiac Doppler, Color Doppler and Intracardiac            Opacification Agent Indications:    I48.0 Paroxysmal atrial fibrillation  History:        Patient has prior history of Echocardiogram examinations, most                 recent 12/15/2019. COPD and Stroke; Risk Factors:Hypertension,                 Diabetes and Dyslipidemia. CKD.  Sonographer:    Jonelle Sidle Dance Referring Phys: [id number] Margie Billet IMPRESSIONS  1. Left ventricular ejection fraction, by estimation, is 60 to 65%. The left ventricle has normal function. The left ventricle  has no regional wall motion  abnormalities. Left ventricular diastolic parameters were normal.  2. Right ventricular systolic function is normal. The right ventricular size is normal.  3. The mitral valve is normal in structure. No evidence of mitral valve regurgitation.  4. The aortic valve is normal in structure. Aortic valve regurgitation is not visualized.  5. Aortic dilatation noted. There is borderline dilatation of the ascending aorta, measuring 39 mm. FINDINGS  Left Ventricle: Left ventricular ejection fraction, by estimation, is 60 to 65%. The left ventricle has normal function. The left ventricle has no regional wall motion abnormalities. Definity contrast agent was given IV to delineate the left ventricular  endocardial borders. The left ventricular internal cavity size was normal in size. There is no left ventricular hypertrophy. Left ventricular diastolic parameters were normal. Right Ventricle: The right ventricular size is normal. Right vetricular wall thickness was not well visualized. Right ventricular systolic function is normal. Left Atrium: Left atrial size was normal in size. Right Atrium: Right atrial size was normal in size. Pericardium: There is no evidence of pericardial effusion. Mitral Valve: The mitral valve is normal in structure. No evidence of mitral valve regurgitation. Tricuspid Valve: The tricuspid valve is normal in structure. Tricuspid valve regurgitation is trivial. Aortic Valve: The aortic valve is normal in structure. Aortic valve regurgitation is not visualized. Pulmonic Valve: The pulmonic valve was normal in structure. Pulmonic valve regurgitation is not visualized. Aorta: Aortic dilatation noted. There is borderline dilatation of the ascending aorta, measuring 39 mm. IAS/Shunts: The atrial septum is grossly normal.  LEFT VENTRICLE PLAX 2D LVIDd:         5.20 cm  Diastology LVIDs:         3.30 cm  LV e' medial:    5.33 cm/s LV PW:         1.50 cm  LV E/e' medial:  11.4 LV IVS:        1.30 cm  LV e'  lateral:   8.27 cm/s LVOT diam:     2.10 cm  LV E/e' lateral: 7.4 LV SV:         46 LV SV Index:   19 LVOT Area:     3.46 cm  RIGHT VENTRICLE             IVC RV S prime:     14.50 cm/s  IVC diam: 1.90 cm TAPSE (M-mode): 2.3 cm LEFT ATRIUM           Index LA diam:      4.70 cm 1.99 cm/m LA Vol (A4C): 49.4 ml 20.89 ml/m  AORTIC VALVE LVOT Vmax:   65.30 cm/s LVOT Vmean:  41.900 cm/s LVOT VTI:    0.133 m  AORTA Ao Root diam: 3.80 cm Ao Asc diam:  3.90 cm MITRAL VALVE MV Area (PHT): 3.58 cm    SHUNTS MV Decel Time: 212 msec    Systemic VTI:  0.13 m MV E velocity: 60.80 cm/s  Systemic Diam: 2.10 cm MV A velocity: 72.00 cm/s MV E/A ratio:  0.84 Mertie Moores MD Electronically signed by Mertie Moores MD Signature Date/Time: 01/01/2021/4:57:26 PM    Final     Cardiac Studies   Echo:    1. Left ventricular ejection fraction, by estimation, is 60 to 65%. The  left ventricle has normal function. The left ventricle has no regional  wall motion abnormalities. Left ventricular diastolic parameters were  normal.   2. Right ventricular systolic function is normal. The right ventricular  size is normal.   3. The mitral valve is normal in structure. No evidence of mitral valve  regurgitation.   4. The aortic valve is normal in structure. Aortic valve regurgitation is  not visualized.   5. Aortic dilatation noted. There is borderline dilatation of the  ascending aorta, measuring 39 mm.   Patient Profile     65 y.o. male SNF pt with HTN, HLD, type 2 DM, epilepsy, CVA, COPD, paroxysmal A fib on coumadin, CKD III, OSA on CPAP, PVD, COPD, hx of DVT, chronic lymphedema, and obesity presented with multiple medical issues. Initially sepsis with biliary source, but decompensated 8/4 with hypoxemia, R lung atelectasis with volume loss and mediastinal shift to the right, suspected mucus plugging. Cardiology following for afib RVR.  Assessment & Plan    Paroxysmal atrial fib Maintaining NSR.   Continue low dose beta  blocker.  Bradycardia precludes up titration.    Mr. Kenlin Pietrzyk has a CHA2DS2 - VASc score of 7.    Hx of HTN with hypotension this admission BPs are now back up.  Continue current therapy.   Chronic diastolic HF Appears to be euvolemic with IV Lasix yesterday.     For questions or updates, please contact [place] Please consult [email or web address] for contact info under Cardiology/STEMI.  Signed, Minus Breeding, MD 01/02/2021, 12:25 PM

## 2021-01-02 NOTE — Progress Notes (Signed)
[facility] for Heparin (warfarin on hold) Indication: Afib, history of DVT  Allergies  Allergen Reactions   Codeine Other (See Comments)    Unknown Reaction    Patient Measurements: Height: 6' (182.9 cm) Weight: 116 kg (255 lb 11.7 oz) IBW/kg (Calculated) : 77.6 Heparin dosing weight: 102.7 kg  Vital Signs: Temp: 97.7 F (36.5 C) (08/06 0803) Temp Source: Axillary (08/06 0803) BP: 134/67 (08/06 0600) Pulse Rate: 62 (08/06 0600)  Labs: Recent Labs    12/31/20 0641 12/31/20 2224 01/01/21 0446 01/01/21 0723 01/01/21 1856 01/02/21 0406  HGB 9.9*  --  10.5* 9.9*  --  9.8*  HCT 33.9*  --  31.0* 32.2*  --  32.4*  PLT 142*  --   --  153  --  149*  LABPROT 17.4*  --   --  16.4*  --  15.9*  INR 1.4*  --   --  1.3*  --  1.3*  HEPARINUNFRC  --    < >  --  0.20* 0.33 0.62  CREATININE 2.13*  --   --  2.08*  --  2.06*   < > = values in this interval not displayed.    Estimated Creatinine Clearance: 46.4 mL/min (A) (by C-G formula based on SCr of 2.06 mg/dL (H)).   Medical History: Past Medical History:  Diagnosis Date   Absence epileptic syndrome, not intractable, without status epilepticus ([place]) 07/23/2019   Acute renal failure superimposed on stage 3 chronic kidney disease ([place]) 07/23/2019   Atherosclerotic heart disease 07/23/2019   Atrial fibrillation with RVR ([facility]) 12/14/2019   Benign enlargement of prostate 07/23/2019   Body mass index 50.0-59.9, adult ([place]) 07/23/2019   Cellulitis of leg 12/14/2019   Chronic respiratory failure with hypoxia ([place]) 07/23/2019   Chronic venous hypertension (idiopathic) with ulcer of left lower extremity ([place]) 07/23/2019   COPD (chronic obstructive pulmonary disease) ([place]) 07/23/2019   Diabetic glomerulopathy ([place]) 10/15/2019   Diabetic neuropathy ([place]) 07/23/2019   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[place]) 10/15/2019   Epilepsy, grand mal ([place])    Essential hypertension 01/22/2018   Gout attack 10/06/2019    H/O: stroke 01/22/2018   History of DVT (deep vein thrombosis) 01/22/2018   Long term (current) use of anticoagulants 07/23/2019   Lumbago    Lymphedema of both lower extremities 01/22/2018   Mixed hyperlipidemia 07/23/2019   Morbid obesity ([place]) 07/23/2019   Obstructive sleep apnea 07/23/2019   Paroxysmal atrial fibrillation ([place]) 01/07/2020   Peripheral vascular disease, unspecified ([place]) 07/12/2011   Secondary hypercoagulable state ([place]) 01/07/2020   Shoulder bursitis 07/25/2019   Stroke [facility])     Assessment: 65 y/o M from SNF with hypoxemia. Unable to follow commands in the ED. Having some abdominal pain. On warfarin PTA for history of DVT and afib- of note, Nursing MAR reported as not taking (unclear why).  Underwent cholecystostomy tube placement 8/2 and ERCP on 8/3 with choledocholithiasis found, sphincterotomy performed. Minor bleeding at sphincterotomy site treated with epinephrine. Patient was on IV heparin prior and was held post procedure. Discussed with Dr. Shearon Stalls and will resume heparin.   Heparin level remains therapeutic  at 0.62 (trending up) with heparin running at 2200 units/hr. H/H stable, PLT WNL, no overt bleeding noted. RN states some slight hematuria from scarring/trauma.   Goal of Therapy:  Heparin level 0.3-0.7 units/mL Monitor platelets by anticoagulation protocol: Yes   Plan:  Reduce heparin slightly to 2100 units/hr as appears accumulating F/U restart of warfarin  Monitor daily  heparin level, CBC and s/s of bleeding.   Sloan Leiter, PharmD, BCPS, BCCCP Clinical Pharmacist Please refer to [facility] for [place] numbers 01/02/2021, 8:44 AM

## 2021-01-02 NOTE — Progress Notes (Signed)
NAME:  Matthew Gutierrez, MRN:  [id number], DOB:  1956/02/12, LOS: 4 ADMISSION DATE:  12/28/2020, CONSULTATION DATE:  8/5 REFERRING MD:  Reesa Chew , CHIEF COMPLAINT:  Confusion   History of Present Illness:  65 y/o male admitted with ascending cholangitis who underwent an ERCP on 8/3.  On the day following the procedure he developed atrial fibrillation with RVR and R lung atelectasis with mediastinal shift felt to be due to aspiration pneumonia and mucus plugging.  Pertinent  Medical History  Hyperlipidemia, diabetes, hypertension, atrial fibrillation on Coumadin  Significant Hospital Events: Including procedures, antibiotic start and stop dates in addition to other pertinent events   8/1 presents to ED, septic, biliary source,, surgery eval recommended MRCP, this revealed cholangitis 8/2 patient mid to the hospital, [place] 8/3 ERCP 8/4 hypoxemia, A. fib with RVR, chest x-ray reveals right lung atelectasis with volume loss, hypercarbic, hypotensive prompting PCCM evaluation Awake and alert, slow to respond to questions, following commands, sats 100% on 3L>>CXR >>Right lower lobe consolidation and right effusion unchanged  Interim History / Subjective:  Feels stronger today Slept well Off amiodarone On 3L Horace More awake today  Objective   Blood pressure 134/67, pulse 62, temperature 97.7 F (36.5 C), temperature source Axillary, resp. rate (!) 21, height 6' (1.829 m), weight 116 kg, SpO2 100 %.        Intake/Output Summary (Last 24 hours) at 01/02/2021 0820 Last data filed at 01/02/2021 0600 Gross per 24 hour  Intake 1136.51 ml  Output 2900 ml  Net -1763.49 ml   Filed Weights   12/30/20 0355 01/01/21 0424 01/01/21 1000  Weight: 131.5 kg 116 kg 116 kg    Examination:  General:  Chronically ill, obese male resting comfortably in bed HENT: NCAT OP clear PULM: CTA B, normal effort CV: RRR, no mgr GI: BS+, soft, nontender MSK: normal bulk and tone Derm: chronic venous stasis changes in  legs, no appreciable skin breakdown over sacrum or buttocks Neuro: awake, alert,  [facility] Problem list     Assessment & Plan:  Acute on chronic hypoxemic and hypercarbic respiratory failure Aspiration pneumonia  Obstructive sleep apnea COPD Chronic respiratory failure with hypercapnea Non-compliant with NIMV at home (SNF) Continue to wean off oxygen for O2 saturation > 88% Continue ceftriaxone Incentive spirometry Out of bed  Flutter Use NIMV at night: I counseled him at length on this today  Toxic metabolic encephalopathy: improved Frequent orientation Minimze sedation  Seizure disorder Depakote dilantin  Sepsis due to ascending cholangitis and cholecystitis due to Klebsiella pneumonia Continue to monitor GB drain output Continue ceftriaxone  Atrial fibrillation with RVR Tele Heparin infusion  AKI > stable Hypokalemia Replace K Monitor BMET and UOP Replace electrolytes as needed  Hyperglycemia SSI  Move to floor, TRH service  Best Practice (right click and "Reselect all SmartList Selections" daily)   Diet/type: dysphagia diet (see orders) DVT prophylaxis: systemic heparin GI prophylaxis: N/A Lines: N/A Foley:  N/A Code Status:  full code Last date of multidisciplinary goals of care discussion [not clear]  Labs   CBC: Recent Labs  Lab 12/28/20 2141 12/29/20 0340 12/30/20 0032 12/31/20 0641 01/01/21 0446 01/01/21 0723 01/02/21 0406  WBC 20.4* 16.7* 9.3 13.1*  --  11.5* 10.1  NEUTROABS 17.7*  --  7.4  --   --  8.9*  --   HGB 11.9* 11.3* 11.3* 9.9* 10.5* 9.9* 9.8*  HCT 38.6* 35.5* 36.2* 33.9* 31.0* 32.2* 32.4*  MCV 86.2 86.0 87.2 90.9  --  88.0 88.3  PLT 198 165 142* 142*  --  153 149*    Basic Metabolic Panel: Recent Labs  Lab 12/29/20 0340 12/30/20 0032 12/31/20 0641 01/01/21 0446 01/01/21 0723 01/02/21 0406  NA 135 138 138 140 139 139  K 3.5 3.4* 4.1 3.1* 3.1* 3.2*  CL 94* 96* 99  --  100 99  CO2 '30 29 26  '$ --   28 30  GLUCOSE 207* 181* 238*  --  208* 129*  BUN 28* 28* 35*  --  43* 40*  CREATININE 1.77* 1.71* 2.13*  --  2.08* 2.06*  CALCIUM 8.8* 9.1 8.8*  --  8.7* 8.9  MG  --  1.4* 1.8  --  2.8* 2.3  PHOS  --   --   --   --  3.2  --    GFR: Estimated Creatinine Clearance: 46.4 mL/min (A) (by C-G formula based on SCr of 2.06 mg/dL (H)). Recent Labs  Lab 12/28/20 2145 12/28/20 2230 12/29/20 0340 12/30/20 0032 12/31/20 0641 01/01/21 0723 01/02/21 0406  WBC  --   --  16.7* 9.3 13.1* 11.5* 10.1  LATICACIDVEN 2.2* 2.5* 1.4  --   --   --   --     Liver Function Tests: Recent Labs  Lab 12/29/20 0340 12/30/20 0032 12/31/20 0641 01/01/21 0723 01/02/21 0406  AST 48* 54* 67* 98* 89*  ALT '23 26 28 '$ 38 41  ALKPHOS 244* 200* 212* 234* 267*  BILITOT 4.6* 4.4* 4.7* 3.7* 3.9*  PROT 6.7 6.7 6.2* 6.0* 6.3*  ALBUMIN 1.7* 1.8* 1.5* 1.6* 1.7*   No results for input(s): LIPASE, AMYLASE in the last 168 hours. Recent Labs  Lab 12/28/20 2200  AMMONIA 16    ABG    Component Value Date/Time   PHART 7.385 01/01/2021 0446   PCO2ART 48.1 (H) 01/01/2021 0446   PO2ART 59 (L) 01/01/2021 0446   HCO3 28.9 (H) 01/01/2021 0446   TCO2 30 01/01/2021 0446   ACIDBASEDEF 1.3 12/31/2020 0340   O2SAT 90.0 01/01/2021 0446     Coagulation Profile: Recent Labs  Lab 12/29/20 0340 12/30/20 0032 12/31/20 0641 01/01/21 0723 01/02/21 0406  INR 1.3* 1.4* 1.4* 1.3* 1.3*    Cardiac Enzymes: No results for input(s): CKTOTAL, CKMB, CKMBINDEX, TROPONINI in the last 168 hours.  HbA1C: Hemoglobin A1C  Date/Time Value Ref Range Status  06/07/2019 12:00 AM 8.4  Final  11/12/2018 12:00 AM 8.0  Final   Hgb A1c MFr Bld  Date/Time Value Ref Range Status  12/29/2020 03:40 AM 8.6 (H) 4.8 - 5.6 % Final    Comment:    (NOTE) Pre diabetes:          5.7%-6.4%  Diabetes:              >6.4%  Glycemic control for   <7.0% adults with diabetes   03/26/2020 11:37 AM 7.2 (H) 4.8 - 5.6 % Final    Comment:              Prediabetes: 5.7 - 6.4          Diabetes: >6.4          Glycemic control for adults with diabetes: <7.0     CBG: Recent Labs  Lab 01/01/21 1554 01/01/21 2116 01/01/21 2314 01/02/21 0346 01/02/21 0801  GLUCAP 168* 137* 139* 115* 124*       Critical care time: n/a, > 35 minutes spent in coordinating care, placing orders     Roselie Awkward, MD [facility] PCCM  Pager: [phone] Cell: [phone] After 7:00 pm call Elink  [phone]

## 2021-01-02 NOTE — Progress Notes (Signed)
[facility] PCCM  CXR from this morning reviewed: near white out of right lung Interestingly this does not match the way he appeared clinically Will repeat CXR now If still has this abnormality will make him NPO and again strongly encourage BIPAP with pulm toilette.  Roselie Awkward, MD [place] PCCM Pager: [phone] Cell: [phone] After 7:00 pm call Elink  [phone]

## 2021-01-03 ENCOUNTER — Encounter (HOSPITAL_COMMUNITY): Payer: Self-pay | Admitting: Family Medicine

## 2021-01-03 ENCOUNTER — Inpatient Hospital Stay (HOSPITAL_COMMUNITY): Payer: No Typology Code available for payment source

## 2021-01-03 DIAGNOSIS — K8033 Calculus of bile duct with acute cholangitis with obstruction: Secondary | ICD-10-CM

## 2021-01-03 LAB — GLUCOSE, CAPILLARY
Glucose-Capillary: 143 mg/dL — ABNORMAL HIGH (ref 70–99)
Glucose-Capillary: 118 mg/dL — ABNORMAL HIGH (ref 70–99)
Glucose-Capillary: 156 mg/dL — ABNORMAL HIGH (ref 70–99)
Glucose-Capillary: 153 mg/dL — ABNORMAL HIGH (ref 70–99)
Glucose-Capillary: 189 mg/dL — ABNORMAL HIGH (ref 70–99)

## 2021-01-03 LAB — CBC
HCT: 34.1 % — ABNORMAL LOW (ref 39.0–52.0)
Hemoglobin: 10.7 g/dL — ABNORMAL LOW (ref 13.0–17.0)
MCH: 27.7 pg (ref 26.0–34.0)
MCHC: 31.4 g/dL (ref 30.0–36.0)
MCV: 88.3 fL (ref 80.0–100.0)
Platelets: 119 10*3/uL — ABNORMAL LOW (ref 150–400)
RBC: 3.86 MIL/uL — ABNORMAL LOW (ref 4.22–5.81)
RDW: 17.6 % — ABNORMAL HIGH (ref 11.5–15.5)
WBC: 10.5 10*3/uL (ref 4.0–10.5)
nRBC: 0 % (ref 0.0–0.2)

## 2021-01-03 LAB — AEROBIC/ANAEROBIC CULTURE W GRAM STAIN (SURGICAL/DEEP WOUND)

## 2021-01-03 LAB — COMPREHENSIVE METABOLIC PANEL
ALT: 35 U/L (ref 0–44)
AST: 60 U/L — ABNORMAL HIGH (ref 15–41)
Albumin: 1.7 g/dL — ABNORMAL LOW (ref 3.5–5.0)
Alkaline Phosphatase: 279 U/L — ABNORMAL HIGH (ref 38–126)
Anion gap: 10 (ref 5–15)
BUN: 38 mg/dL — ABNORMAL HIGH (ref 8–23)
CO2: 28 mmol/L (ref 22–32)
Calcium: 8.7 mg/dL — ABNORMAL LOW (ref 8.9–10.3)
Chloride: 100 mmol/L (ref 98–111)
Creatinine, Ser: 1.88 mg/dL — ABNORMAL HIGH (ref 0.61–1.24)
GFR, Estimated: 39 mL/min — ABNORMAL LOW (ref >60)
Glucose, Bld: 145 mg/dL — ABNORMAL HIGH (ref 70–99)
Potassium: 4 mmol/L (ref 3.5–5.1)
Sodium: 138 mmol/L (ref 135–145)
Total Bilirubin: 4.1 mg/dL — ABNORMAL HIGH (ref 0.3–1.2)
Total Protein: 6 g/dL — ABNORMAL LOW (ref 6.5–8.1)

## 2021-01-03 LAB — MAGNESIUM: Magnesium: 2 mg/dL (ref 1.7–2.4)

## 2021-01-03 LAB — PHOSPHORUS: Phosphorus: 3.1 mg/dL (ref 2.5–4.6)

## 2021-01-03 LAB — HEPARIN LEVEL (UNFRACTIONATED): Heparin Unfractionated: 0.5 IU/mL (ref 0.30–0.70)

## 2021-01-03 MED ORDER — WARFARIN SODIUM 2 MG PO TABS
2.0000 mg | ORAL_TABLET | Freq: Once | ORAL | Status: AC
  Administered 2021-01-03: 2 mg via ORAL
  Filled 2021-01-03: qty 1

## 2021-01-03 MED ORDER — WARFARIN - PHARMACIST DOSING INPATIENT: Freq: Every day | Status: DC

## 2021-01-03 MED ORDER — HEPARIN (PORCINE) 25000 UT/250ML-% IV SOLN
2100.0000 [IU]/h | INTRAVENOUS | Status: DC
  Administered 2021-01-03 – 2021-01-05 (×4): 2100 [IU]/h via INTRAVENOUS
  Filled 2021-01-03 (×4): qty 250

## 2021-01-03 NOTE — Progress Notes (Addendum)
Addendum:  Pharmacy consulted to resume Warfarin with Heparin IV overlap until INR >2 for at least 2 days.  Significant DDI noted with Phenytoin which may impact INR.  Patient was also noted to be on IV Amiodarone from 8/4 through 8/5.  Patient is noted initiated on a dysphagia 3 diet on 8/6 with poor to fair intake noted so far (10-80% per meal- very variable) which may impact vitamin K stores. Patient was NPO for admission prior to this. INR today is 1.3 (has been warfarin for prolonged period of time prior to admission). Platelets are down some today at 119 (165 prior to Heparin start) - will monitor closely and for signs of clot/bleed. H/H are low-stable for admission. No overt bleeding noted.   PTA regimen: Warfarin '2mg'$  po daily on Monday, Wednesday, Friday and '4mg'$  po daily all other days.   Plan:  Continue IV Heparin at 2100 units/hr.  Restart Warfarin this PM with '2mg'$  po x1 and monitor INR trend.  Daily PT/INR Repeat CBC in AM - Close monitoring of platelets.  *If platelets remain stable or improve, consider transition of IV Heparin to Lovenox therapy to help set patient up for discharge.   [place] for Heparin (warfarin on hold) Indication: Afib, history of DVT  Allergies  Allergen Reactions   Codeine Other (See Comments)    Unknown Reaction    Patient Measurements: Height: 6' (182.9 cm) Weight: 115.5 kg (254 lb 10.1 oz) IBW/kg (Calculated) : 77.6 Heparin dosing weight: 102.7 kg  Vital Signs: Temp: 97.9 F (36.6 C) (08/07 0700) Temp Source: Axillary (08/07 0700) BP: 75/50 (08/07 0807) Pulse Rate: 60 (08/07 0807)  Labs: Recent Labs    01/01/21 0723 01/01/21 1856 01/02/21 0406 01/03/21 0221 01/03/21 0445  HGB 9.9*  --  9.8* 10.7*  --   HCT 32.2*  --  32.4* 34.1*  --   PLT 153  --  149* 119*  --   LABPROT 16.4*  --  15.9*  --   --   INR 1.3*  --  1.3*  --   --   HEPARINUNFRC 0.20* 0.33 0.62  --  0.50  CREATININE 2.08*   --  2.06*  --  1.88*    Estimated Creatinine Clearance: 50.7 mL/min (A) (by C-G formula based on SCr of 1.88 mg/dL (H)).   Medical History: Past Medical History:  Diagnosis Date   Absence epileptic syndrome, not intractable, without status epilepticus ([place]) 07/23/2019   Acute renal failure superimposed on stage 3 chronic kidney disease ([place]) 07/23/2019   Atherosclerotic heart disease 07/23/2019   Atrial fibrillation with RVR ([place]) 12/14/2019   Benign enlargement of prostate 07/23/2019   Body mass index 50.0-59.9, adult ([place]) 07/23/2019   Cellulitis of leg 12/14/2019   Chronic respiratory failure with hypoxia ([place]) 07/23/2019   Chronic venous hypertension (idiopathic) with ulcer of left lower extremity ([place]) 07/23/2019   COPD (chronic obstructive pulmonary disease) ([place]) 07/23/2019   Diabetic glomerulopathy ([place]) 10/15/2019   Diabetic neuropathy ([place]) 07/23/2019   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[place]) 10/15/2019   Epilepsy, grand mal ([place])    Essential hypertension 01/22/2018   Gout attack 10/06/2019   H/O: stroke 01/22/2018   History of DVT (deep vein thrombosis) 01/22/2018   Long term (current) use of anticoagulants 07/23/2019   Lumbago    Lymphedema of both lower extremities 01/22/2018   Mixed hyperlipidemia 07/23/2019   Morbid obesity ([place]) 07/23/2019   Obstructive sleep apnea 07/23/2019   Paroxysmal atrial fibrillation ([place]) 01/07/2020  Peripheral vascular disease, unspecified ([place]) 07/12/2011   Secondary hypercoagulable state ([place]) 01/07/2020   Shoulder bursitis 07/25/2019   Stroke [facility])     Assessment: 65 y/o M from SNF with hypoxemia. Unable to follow commands in the ED. Having some abdominal pain. On warfarin PTA for history of DVT and afib- of note, Nursing MAR reported as not taking (unclear why).  Underwent cholecystostomy tube placement 8/2 and ERCP on 8/3 with choledocholithiasis found, sphincterotomy performed. Minor bleeding at sphincterotomy site treated with  epinephrine. Patient was on IV heparin prior and was held post procedure. Discussed with Dr. Shearon Stalls and will resume heparin.   Heparin level remains therapeutic  at 0.5 with heparin running at 2100 units/hr. H/H stable, PLT decreased at 119 -will watch closely. No bleeding per RN.  Goal of Therapy:  Heparin level 0.3-0.7 units/mL Monitor platelets by anticoagulation protocol: Yes   Plan:  Continue heparin slightly to 2100 units/hr as appears accumulating F/U restart of warfarin  *Will monitor Platelets closely as starting to trend down Monitor daily heparin level, CBC and s/s of bleeding.   Sloan Leiter, PharmD, BCPS, BCCCP Clinical Pharmacist Please refer to [facility] for [place] numbers 01/03/2021, 8:37 AM

## 2021-01-03 NOTE — Plan of Care (Signed)
  Problem: Education: Goal: Knowledge of General Education information will improve Description Including pain rating scale, medication(s)/side effects and non-pharmacologic comfort measures Outcome: Progressing   Problem: Health Behavior/Discharge Planning: Goal: Ability to manage health-related needs will improve Outcome: Progressing   

## 2021-01-03 NOTE — Progress Notes (Signed)
Progress Note    Matthew Gutierrez  [id number] DOB: [birth year]  DOA: 12/28/2020 PCP: Lillard Anes, MD      Brief Narrative:    Medical records reviewed and are as summarized below:  Matthew Gutierrez is a 65 y.o. male  with medical history significant for DMT2, CKD 3, a-fib, COPD, neuropathy due to diabetes, history of stroke, OSA, PVD, who was brought to the hospital from nursing home because of altered mental status and hypoxia with oxygen saturation in the 80s.  He was admitted to the hospital for acute ascending cholangitis.  He was treated with empiric antibiotics.  Cholecystostomy tube was placed by IR on 12/29/2020.  He underwent ERCP on 12/30/2020.  Following the procedure, he developed atrial fibrillation with RVR and right lung atelectasis with mediastinal shift felt to be due to aspiration pneumonia and mucous plugging.    Assessment/Plan:   Principal Problem:   Acute cholecystitis Active Problems:   Essential hypertension   H/O: stroke   Acute renal failure superimposed on stage 3 chronic kidney disease (HCC)   COPD (chronic obstructive pulmonary disease) ([place])   Long term (current) use of anticoagulants   Obstructive sleep apnea   Paroxysmal atrial fibrillation (HCC)   Sepsis (HCC)   Seizure disorder (HCC)   Encephalopathy   Abnormal magnetic resonance cholangiopancreatography (MRCP)   Calculus of bile duct with acute cholangitis with obstruction   Elevated LFTs   Acute respiratory distress    Body mass index is 34.53 kg/m.  (Obesity)   Sepsis secondary to ascending cholangitis and cholecystitis due to Klebsiella pneumoniae, aspiration pneumonia: S/p cholecystostomy tube placed on 12/29/2020.  S/p ERCP with biliary sphincterotomy on 12/30/2020.  Continue IV Rocephin.  Acute on chronic hypoxic and hypercapnic respiratory failure: Continue oxygen via nasal cannula  Acute toxic metabolic encephalopathy: Improved.  COPD, OSA: Continue  bronchodilators  Atrial fibrillation with RVR: Discontinue IV heparin infusion and start Eliquis tonight.  Seizure disorder: Continue Dilantin and Depakote  CKD stage IIIb: Creatinine stable  Plan was discussed with intensivist, Dr. Lake Bells   Diet Order             DIET DYS 3 Room service appropriate? No; Fluid consistency: Thin  Diet effective now                      Consultants: Cardiologist Intensivist General surgeon Gastroenterologist  Procedures: Cholecystostomy tube placement on 12/29/2020 ERCP with biliary sphincterectomy on 12/30/2020.     Medications:    Chlorhexidine Gluconate Cloth  6 each Topical Daily   collagenase   Topical Daily   divalproex  125 mg Oral Q12H   And   divalproex  500 mg Oral Q12H   gabapentin  300 mg Oral QHS   guaiFENesin  5 mL Oral BID   insulin aspart  0-9 Units Subcutaneous Q4H   phenytoin (DILANTIN) IV  100 mg Intravenous Q12H   sodium chloride flush  5 mL Intracatheter Q12H   sodium chloride HYPERTONIC  4 mL Nebulization Daily   Continuous Infusions:  cefTRIAXone (ROCEPHIN)  IV 2 g (01/03/21 1204)   heparin 2,100 Units/hr (01/03/21 0952)     Anti-infectives (From admission, onward)    Start     Dose/Rate Route Frequency Ordered Stop   12/31/20 1245  cefTRIAXone (ROCEPHIN) 2 g in sodium chloride 0.9 % 100 mL IVPB        2 g 200 mL/hr over 30 Minutes Intravenous Every 24 hours 12/31/20 1152  12/30/20 1300  metroNIDAZOLE (FLAGYL) IVPB 500 mg  Status:  Discontinued        500 mg 100 mL/hr over 60 Minutes Intravenous Every 12 hours 12/30/20 1203 12/31/20 1340   12/29/20 2200  vancomycin (VANCOREADY) IVPB 1250 mg/250 mL  Status:  Discontinued        1,250 mg 166.7 mL/hr over 90 Minutes Intravenous Every 24 hours 12/29/20 0006 12/29/20 1831   12/29/20 2100  cefTRIAXone (ROCEPHIN) 2 g in sodium chloride 0.9 % 100 mL IVPB  Status:  Discontinued        2 g 200 mL/hr over 30 Minutes Intravenous Every 24 hours 12/29/20  1831 12/31/20 1152   12/29/20 1000  ceFEPIme (MAXIPIME) 2 g in sodium chloride 0.9 % 100 mL IVPB  Status:  Discontinued        2 g 200 mL/hr over 30 Minutes Intravenous Every 12 hours 12/29/20 0200 12/29/20 1831   12/29/20 0600  piperacillin-tazobactam (ZOSYN) IVPB 3.375 g  Status:  Discontinued       See Hyperspace for full Linked Orders Report.   3.375 g 12.5 mL/hr over 240 Minutes Intravenous Every 8 hours 12/28/20 2244 12/29/20 0200   12/29/20 0200  metroNIDAZOLE (FLAGYL) IVPB 500 mg  Status:  Discontinued        500 mg 100 mL/hr over 60 Minutes Intravenous Every 8 hours 12/29/20 0154 12/29/20 1831   12/29/20 0000  vancomycin (VANCOREADY) IVPB 1250 mg/250 mL  Status:  Discontinued        1,250 mg 166.7 mL/hr over 90 Minutes Intravenous Every 24 hours 12/28/20 2246 12/29/20 0006   12/28/20 2245  vancomycin (VANCOCIN) 2,500 mg in sodium chloride 0.9 % 500 mL IVPB        2,500 mg 250 mL/hr over 120 Minutes Intravenous  Once 12/28/20 2244 12/29/20 0205   12/28/20 2245  piperacillin-tazobactam (ZOSYN) IVPB 3.375 g       See Hyperspace for full Linked Orders Report.   3.375 g 100 mL/hr over 30 Minutes Intravenous  Once 12/28/20 2244 12/28/20 2319              Family Communication/Anticipated D/C date and plan/Code Status   DVT prophylaxis:      Code Status: Full Code  Family Communication: None Disposition Plan:    Status is: Inpatient  Remains inpatient appropriate because:IV treatments appropriate due to intensity of illness or inability to take PO and Inpatient level of care appropriate due to severity of illness  Dispo: The patient is from: Home              Anticipated d/c is to: Home              Patient currently is not medically stable to d/c.   Difficult to place patient No           Subjective:   Interval events noted.  He complains of fatigue  Objective:    Vitals:   01/03/21 0827 01/03/21 0828 01/03/21 0829 01/03/21 0830  BP:       Pulse: (!) 58 62 (!) 59 (!) 58  Resp: (!) '22 16 16 '$ (!) 21  Temp:      TempSrc:      SpO2: 100% 100% 100% 99%  Weight:      Height:       No data found.   Intake/Output Summary (Last 24 hours) at 01/03/2021 1206 Last data filed at 01/03/2021 0900 Gross per 24 hour  Intake 1563.19 ml  Output  1060 ml  Net 503.19 ml   Filed Weights   01/01/21 0424 01/01/21 1000 01/03/21 0500  Weight: 116 kg 116 kg 115.5 kg    Exam:  GEN: NAD SKIN: Warm and dry EYES: EOMI ENT: MMM CV: RRR PULM: CTA B ABD: soft, ND, NT, +BS, +biliary drain CNS: AAO x 3, non focal EXT: No edema or tenderness    Pressure Injury 12/29/20 Heel Left;Lateral Unstageable - Full thickness tissue loss in which the base of the injury is covered by slough (yellow, tan, gray, green or brown) and/or eschar (tan, brown or black) in the wound bed. (Active)  12/29/20 2300  Location: Heel  Location Orientation: Left;Lateral  Staging: Unstageable - Full thickness tissue loss in which the base of the injury is covered by slough (yellow, tan, gray, green or brown) and/or eschar (tan, brown or black) in the wound bed.  Wound Description (Comments):   Present on Admission: Yes     Pressure Injury 12/29/20 Ankle Left;Lateral Unstageable - Full thickness tissue loss in which the base of the injury is covered by slough (yellow, tan, gray, green or brown) and/or eschar (tan, brown or black) in the wound bed. (Active)  12/29/20 2300  Location: Ankle  Location Orientation: Left;Lateral  Staging: Unstageable - Full thickness tissue loss in which the base of the injury is covered by slough (yellow, tan, gray, green or brown) and/or eschar (tan, brown or black) in the wound bed.  Wound Description (Comments):   Present on Admission: Yes     Pressure Injury 12/29/20 Ankle Left;Medial;Lateral Unstageable - Full thickness tissue loss in which the base of the injury is covered by slough (yellow, tan, gray, green or brown) and/or eschar  (tan, brown or black) in the wound bed. (Active)  12/29/20 2300  Location: Ankle  Location Orientation: Left;Medial;Lateral  Staging: Unstageable - Full thickness tissue loss in which the base of the injury is covered by slough (yellow, tan, gray, green or brown) and/or eschar (tan, brown or black) in the wound bed.  Wound Description (Comments):   Present on Admission: Yes     Pressure Injury 12/29/20 Ankle Right;Lateral Unstageable - Full thickness tissue loss in which the base of the injury is covered by slough (yellow, tan, gray, green or brown) and/or eschar (tan, brown or black) in the wound bed. (Active)  12/29/20 2300  Location: Ankle  Location Orientation: Right;Lateral  Staging: Unstageable - Full thickness tissue loss in which the base of the injury is covered by slough (yellow, tan, gray, green or brown) and/or eschar (tan, brown or black) in the wound bed.  Wound Description (Comments):   Present on Admission: Yes     Pressure Injury 12/29/20 Heel Right;Lateral Unstageable - Full thickness tissue loss in which the base of the injury is covered by slough (yellow, tan, gray, green or brown) and/or eschar (tan, brown or black) in the wound bed. (Active)  12/29/20 2300  Location: Heel  Location Orientation: Right;Lateral  Staging: Unstageable - Full thickness tissue loss in which the base of the injury is covered by slough (yellow, tan, gray, green or brown) and/or eschar (tan, brown or black) in the wound bed.  Wound Description (Comments):   Present on Admission: Yes     Pressure Injury Foot Right;Lateral Unstageable - Full thickness tissue loss in which the base of the injury is covered by slough (yellow, tan, gray, green or brown) and/or eschar (tan, brown or black) in the wound bed. (Active)  Location: Foot  Location Orientation: Right;Lateral  Staging: Unstageable - Full thickness tissue loss in which the base of the injury is covered by slough (yellow, tan, gray, green  or brown) and/or eschar (tan, brown or black) in the wound bed.  Wound Description (Comments):   Present on Admission: Yes     Data Reviewed:   I have personally reviewed following labs and imaging studies:  Labs: Labs show the following:   Basic Metabolic Panel: Recent Labs  Lab 12/30/20 0032 12/31/20 0641 01/01/21 0446 01/01/21 0723 01/02/21 0406 01/03/21 0445  NA 138 138 140 139 139 138  K 3.4* 4.1 3.1* 3.1* 3.2* 4.0  CL 96* 99  --  100 99 100  CO2 29 26  --  '28 30 28  '$ GLUCOSE 181* 238*  --  208* 129* 145*  BUN 28* 35*  --  43* 40* 38*  CREATININE 1.71* 2.13*  --  2.08* 2.06* 1.88*  CALCIUM 9.1 8.8*  --  8.7* 8.9 8.7*  MG 1.4* 1.8  --  2.8* 2.3 2.0  PHOS  --   --   --  3.2  --  3.1   GFR Estimated Creatinine Clearance: 50.7 mL/min (A) (by C-G formula based on SCr of 1.88 mg/dL (H)). Liver Function Tests: Recent Labs  Lab 12/30/20 0032 12/31/20 0641 01/01/21 0723 01/02/21 0406 01/03/21 0445  AST 54* 67* 98* 89* 60*  ALT 26 28 38 41 35  ALKPHOS 200* 212* 234* 267* 279*  BILITOT 4.4* 4.7* 3.7* 3.9* 4.1*  PROT 6.7 6.2* 6.0* 6.3* 6.0*  ALBUMIN 1.8* 1.5* 1.6* 1.7* 1.7*   No results for input(s): LIPASE, AMYLASE in the last 168 hours. Recent Labs  Lab 12/28/20 2200  AMMONIA 16   Coagulation profile Recent Labs  Lab 12/29/20 0340 12/30/20 0032 12/31/20 0641 01/01/21 0723 01/02/21 0406  INR 1.3* 1.4* 1.4* 1.3* 1.3*    CBC: Recent Labs  Lab 12/28/20 2141 12/29/20 0340 12/30/20 0032 12/31/20 0641 01/01/21 0446 01/01/21 0723 01/02/21 0406 01/03/21 0221  WBC 20.4*   < > 9.3 13.1*  --  11.5* 10.1 10.5  NEUTROABS 17.7*  --  7.4  --   --  8.9*  --   --   HGB 11.9*   < > 11.3* 9.9* 10.5* 9.9* 9.8* 10.7*  HCT 38.6*   < > 36.2* 33.9* 31.0* 32.2* 32.4* 34.1*  MCV 86.2   < > 87.2 90.9  --  88.0 88.3 88.3  PLT 198   < > 142* 142*  --  153 149* 119*   < > = values in this interval not displayed.   Cardiac Enzymes: No results for input(s): CKTOTAL,  CKMB, CKMBINDEX, TROPONINI in the last 168 hours. BNP (last 3 results) No results for input(s): PROBNP in the last 8760 hours. CBG: Recent Labs  Lab 01/02/21 1943 01/02/21 2355 01/03/21 0419 01/03/21 0800 01/03/21 1121  GLUCAP 155* 152* 143* 118* 156*   D-Dimer: No results for input(s): DDIMER in the last 72 hours. Hgb A1c: No results for input(s): HGBA1C in the last 72 hours. Lipid Profile: No results for input(s): CHOL, HDL, LDLCALC, TRIG, CHOLHDL, LDLDIRECT in the last 72 hours. Thyroid function studies: No results for input(s): TSH, T4TOTAL, T3FREE, THYROIDAB in the last 72 hours.  Invalid input(s): FREET3 Anemia work up: No results for input(s): VITAMINB12, FOLATE, FERRITIN, TIBC, IRON, RETICCTPCT in the last 72 hours. Sepsis Labs: Recent Labs  Lab 12/28/20 2145 12/28/20 2230 12/29/20 0340 12/30/20 0032 12/31/20 [id number] 01/01/21  [id number] 01/02/21 0406 01/03/21 0221  WBC  --   --  16.7*   < > 13.1* 11.5* 10.1 10.5  LATICACIDVEN 2.2* 2.5* 1.4  --   --   --   --   --    < > = values in this interval not displayed.    Microbiology Recent Results (from the past 240 hour(s))  Resp Panel by RT-PCR (Flu A&B, Covid) Nasopharyngeal Swab     Status: None   Collection Time: 12/28/20  9:42 PM   Specimen: Nasopharyngeal Swab; Nasopharyngeal(NP) swabs in vial transport medium  Result Value Ref Range Status   SARS Coronavirus 2 by RT PCR NEGATIVE NEGATIVE Final    Comment: (NOTE) SARS-CoV-2 target nucleic acids are NOT DETECTED.  The SARS-CoV-2 RNA is generally detectable in upper respiratory specimens during the acute phase of infection. The lowest concentration of SARS-CoV-2 viral copies this assay can detect is 138 copies/mL. A negative result does not preclude SARS-Cov-2 infection and should not be used as the sole basis for treatment or other patient management decisions. A negative result may occur with  improper specimen collection/handling, submission of specimen  other than nasopharyngeal swab, presence of viral mutation(s) within the areas targeted by this assay, and inadequate number of viral copies(<138 copies/mL). A negative result must be combined with clinical observations, patient history, and epidemiological information. The expected result is Negative.  Fact Sheet for Patients:  EntrepreneurPulse.com.au  Fact Sheet for Healthcare Providers:  IncredibleEmployment.be  This test is no t yet approved or cleared by the [place] FDA and  has been authorized for detection and/or diagnosis of SARS-CoV-2 by FDA under an Emergency Use Authorization (EUA). This EUA will remain  in effect (meaning this test can be used) for the duration of the COVID-19 declaration under Section 564(b)(1) of the Act, 21 U.S.C.section 360bbb-3(b)(1), unless the authorization is terminated  or revoked sooner.       Influenza A by PCR NEGATIVE NEGATIVE Final   Influenza B by PCR NEGATIVE NEGATIVE Final    Comment: (NOTE) The Xpert Xpress SARS-CoV-2/FLU/RSV plus assay is intended as an aid in the diagnosis of influenza from Nasopharyngeal swab specimens and should not be used as a sole basis for treatment. Nasal washings and aspirates are unacceptable for Xpert Xpress SARS-CoV-2/FLU/RSV testing.  Fact Sheet for Patients: EntrepreneurPulse.com.au  Fact Sheet for Healthcare Providers: IncredibleEmployment.be  This test is not yet approved or cleared by the [place] FDA and has been authorized for detection and/or diagnosis of SARS-CoV-2 by FDA under an Emergency Use Authorization (EUA). This EUA will remain in effect (meaning this test can be used) for the duration of the COVID-19 declaration under Section 564(b)(1) of the Act, 21 U.S.C. section 360bbb-3(b)(1), unless the authorization is terminated or revoked.  Performed at [facility], [place] [address], [place],  [place] [address]   Culture, blood (Routine x 2)     Status: Abnormal   Collection Time: 12/28/20  9:45 PM   Specimen: BLOOD  Result Value Ref Range Status   Specimen Description BLOOD RIGHT ANTECUBITAL  Final   Special Requests   Final    BOTTLES DRAWN AEROBIC AND ANAEROBIC Blood Culture adequate volume   Culture  Setup Time   Final    GRAM NEGATIVE RODS AEROBIC BOTTLE ONLY CRITICAL RESULT CALLED TO, READ BACK BY AND VERIFIED WITH: RN C.COBB ON [id number] AT [id number] BY [place] Performed at [facility], [place] [address] [place], [place] [address]  Culture KLEBSIELLA PNEUMONIAE (A)  Final   Report Status 12/31/2020 FINAL  Final   Organism ID, Bacteria KLEBSIELLA PNEUMONIAE  Final      Susceptibility   Klebsiella pneumoniae - MIC*    AMPICILLIN >=32 RESISTANT Resistant     CEFAZOLIN <=4 SENSITIVE Sensitive     CEFEPIME <=0.12 SENSITIVE Sensitive     CEFTAZIDIME <=1 SENSITIVE Sensitive     CEFTRIAXONE <=0.25 SENSITIVE Sensitive     CIPROFLOXACIN <=0.25 SENSITIVE Sensitive     GENTAMICIN <=1 SENSITIVE Sensitive     IMIPENEM <=0.25 SENSITIVE Sensitive     TRIMETH/SULFA <=20 SENSITIVE Sensitive     AMPICILLIN/SULBACTAM 8 SENSITIVE Sensitive     PIP/TAZO <=4 SENSITIVE Sensitive     * KLEBSIELLA PNEUMONIAE  Blood Culture ID Panel (Reflexed)     Status: Abnormal   Collection Time: 12/28/20  9:45 PM  Result Value Ref Range Status   Enterococcus faecalis NOT DETECTED NOT DETECTED Final   Enterococcus Faecium NOT DETECTED NOT DETECTED Final   Listeria monocytogenes NOT DETECTED NOT DETECTED Final   Staphylococcus species NOT DETECTED NOT DETECTED Final   Staphylococcus aureus (BCID) NOT DETECTED NOT DETECTED Final   Staphylococcus epidermidis NOT DETECTED NOT DETECTED Final   Staphylococcus lugdunensis NOT DETECTED NOT DETECTED Final   Streptococcus species NOT DETECTED NOT DETECTED Final   Streptococcus agalactiae NOT DETECTED NOT DETECTED Final   Streptococcus pneumoniae NOT DETECTED  NOT DETECTED Final   Streptococcus pyogenes NOT DETECTED NOT DETECTED Final   A.calcoaceticus-baumannii NOT DETECTED NOT DETECTED Final   Bacteroides fragilis NOT DETECTED NOT DETECTED Final   Enterobacterales DETECTED (A) NOT DETECTED Final    Comment: Enterobacterales represent a large order of gram negative bacteria, not a single organism. CRITICAL RESULT CALLED TO, READ BACK BY AND VERIFIED WITH: RN C.COBB ON [id number] AT 1322 BY E.PARRISH    Enterobacter cloacae complex NOT DETECTED NOT DETECTED Final   Escherichia coli NOT DETECTED NOT DETECTED Final   Klebsiella aerogenes NOT DETECTED NOT DETECTED Final   Klebsiella oxytoca NOT DETECTED NOT DETECTED Final   Klebsiella pneumoniae DETECTED (A) NOT DETECTED Final    Comment: CRITICAL RESULT CALLED TO, READ BACK BY AND VERIFIED WITH: RN C.COBB ON [id number] AT 1322 BY E.PARRISH    Proteus species NOT DETECTED NOT DETECTED Final   Salmonella species NOT DETECTED NOT DETECTED Final   Serratia marcescens NOT DETECTED NOT DETECTED Final   Haemophilus influenzae NOT DETECTED NOT DETECTED Final   Neisseria meningitidis NOT DETECTED NOT DETECTED Final   Pseudomonas aeruginosa NOT DETECTED NOT DETECTED Final   Stenotrophomonas maltophilia NOT DETECTED NOT DETECTED Final   Candida albicans NOT DETECTED NOT DETECTED Final   Candida auris NOT DETECTED NOT DETECTED Final   Candida glabrata NOT DETECTED NOT DETECTED Final   Candida krusei NOT DETECTED NOT DETECTED Final   Candida parapsilosis NOT DETECTED NOT DETECTED Final   Candida tropicalis NOT DETECTED NOT DETECTED Final   Cryptococcus neoformans/gattii NOT DETECTED NOT DETECTED Final   CTX-M ESBL NOT DETECTED NOT DETECTED Final   Carbapenem resistance IMP NOT DETECTED NOT DETECTED Final   Carbapenem resistance KPC NOT DETECTED NOT DETECTED Final   Carbapenem resistance NDM NOT DETECTED NOT DETECTED Final   Carbapenem resist OXA 48 LIKE NOT DETECTED NOT DETECTED Final   Carbapenem  resistance VIM NOT DETECTED NOT DETECTED Final    Comment: Performed at [facility], [address], [place], [place] [address]  Culture, blood (Routine x 2)  Status: Abnormal   Collection Time: 12/28/20 10:25 PM   Specimen: BLOOD  Result Value Ref Range Status   Specimen Description BLOOD LEFT HAND  Final   Special Requests   Final    BOTTLES DRAWN AEROBIC AND ANAEROBIC Blood Culture adequate volume   Culture  Setup Time   Final    GRAM NEGATIVE RODS AEROBIC BOTTLE ONLY GRAM POSITIVE COCCI IN CLUSTERS ANAEROBIC BOTTLE ONLY CRITICAL RESULT CALLED TO, READ BACK BY AND VERIFIED WITH: C PIERCE,PHARMD '@2209'$  12/29/20 MKELLY    Culture (A)  Final    KLEBSIELLA PNEUMONIAE SUSCEPTIBILITIES PERFORMED ON PREVIOUS CULTURE WITHIN THE LAST 5 DAYS. STAPHYLOCOCCUS COHNII THE SIGNIFICANCE OF ISOLATING THIS ORGANISM FROM A SINGLE SET OF BLOOD CULTURES WHEN MULTIPLE SETS ARE DRAWN IS UNCERTAIN. PLEASE NOTIFY THE MICROBIOLOGY DEPARTMENT WITHIN ONE WEEK IF SPECIATION AND SENSITIVITIES ARE REQUIRED. Performed at [facility], [place] [address], [place], [place] [address]    Report Status 01/01/2021 FINAL  Final  Blood Culture ID Panel (Reflexed)     Status: Abnormal   Collection Time: 12/28/20 10:25 PM  Result Value Ref Range Status   Enterococcus faecalis NOT DETECTED NOT DETECTED Final   Enterococcus Faecium NOT DETECTED NOT DETECTED Final   Listeria monocytogenes NOT DETECTED NOT DETECTED Final   Staphylococcus species DETECTED (A) NOT DETECTED Final    Comment: CRITICAL RESULT CALLED TO, READ BACK BY AND VERIFIED WITH: C PIERCE,PHARMD'@2209'$  12/29/20 MKELLY    Staphylococcus aureus (BCID) NOT DETECTED NOT DETECTED Final   Staphylococcus epidermidis NOT DETECTED NOT DETECTED Final   Staphylococcus lugdunensis NOT DETECTED NOT DETECTED Final   Streptococcus species NOT DETECTED NOT DETECTED Final   Streptococcus agalactiae NOT DETECTED NOT DETECTED Final   Streptococcus pneumoniae NOT  DETECTED NOT DETECTED Final   Streptococcus pyogenes NOT DETECTED NOT DETECTED Final   A.calcoaceticus-baumannii NOT DETECTED NOT DETECTED Final   Bacteroides fragilis NOT DETECTED NOT DETECTED Final   Enterobacterales NOT DETECTED NOT DETECTED Final   Enterobacter cloacae complex NOT DETECTED NOT DETECTED Final   Escherichia coli NOT DETECTED NOT DETECTED Final   Klebsiella aerogenes NOT DETECTED NOT DETECTED Final   Klebsiella oxytoca NOT DETECTED NOT DETECTED Final   Klebsiella pneumoniae NOT DETECTED NOT DETECTED Final   Proteus species NOT DETECTED NOT DETECTED Final   Salmonella species NOT DETECTED NOT DETECTED Final   Serratia marcescens NOT DETECTED NOT DETECTED Final   Haemophilus influenzae NOT DETECTED NOT DETECTED Final   Neisseria meningitidis NOT DETECTED NOT DETECTED Final   Pseudomonas aeruginosa NOT DETECTED NOT DETECTED Final   Stenotrophomonas maltophilia NOT DETECTED NOT DETECTED Final   Candida albicans NOT DETECTED NOT DETECTED Final   Candida auris NOT DETECTED NOT DETECTED Final   Candida glabrata NOT DETECTED NOT DETECTED Final   Candida krusei NOT DETECTED NOT DETECTED Final   Candida parapsilosis NOT DETECTED NOT DETECTED Final   Candida tropicalis NOT DETECTED NOT DETECTED Final   Cryptococcus neoformans/gattii NOT DETECTED NOT DETECTED Final    Comment: Performed at [facility], [address] [place], [place] [address]  Aerobic/Anaerobic Culture w Gram Stain (surgical/deep wound)     Status: None (Preliminary result)   Collection Time: 12/29/20 11:55 AM   Specimen: Gallbladder; Bile  Result Value Ref Range Status   Specimen Description GALL BLADDER  Final   Special Requests NONE  Final   Gram Stain   Final    FEW WBC PRESENT,BOTH PMN AND MONONUCLEAR FEW GRAM NEGATIVE RODS Performed at [facility], [place] [address] [place], [place] [address]    Culture   Final    ABUNDANT KLEBSIELLA PNEUMONIAE NO ANAEROBES ISOLATED; CULTURE IN PROGRESS  FOR 5 DAYS    Report Status PENDING  Incomplete   Organism ID, Bacteria KLEBSIELLA PNEUMONIAE  Final      Susceptibility   Klebsiella pneumoniae - MIC*    AMPICILLIN >=32 RESISTANT Resistant     CEFAZOLIN <=4 SENSITIVE Sensitive     CEFEPIME <=0.12 SENSITIVE Sensitive     CEFTAZIDIME <=1 SENSITIVE Sensitive     CEFTRIAXONE <=0.25 SENSITIVE Sensitive     CIPROFLOXACIN <=0.25 SENSITIVE Sensitive     GENTAMICIN <=1 SENSITIVE Sensitive     IMIPENEM <=0.25 SENSITIVE Sensitive     TRIMETH/SULFA <=20 SENSITIVE Sensitive     AMPICILLIN/SULBACTAM 8 SENSITIVE Sensitive     PIP/TAZO <=4 SENSITIVE Sensitive     * ABUNDANT KLEBSIELLA PNEUMONIAE  MRSA Next Gen by PCR, Nasal     Status: None   Collection Time: 12/30/20 11:08 AM   Specimen: Nasal Mucosa; Nasal Swab  Result Value Ref Range Status   MRSA by PCR Next Gen NOT DETECTED NOT DETECTED Final    Comment: (NOTE) The GeneXpert MRSA Assay (FDA approved for NASAL specimens only), is one component of a comprehensive MRSA colonization surveillance program. It is not intended to diagnose MRSA infection nor to guide or monitor treatment for MRSA infections. Test performance is not FDA approved in patients less than 27 years old. Performed at [facility], [place] [address] [place], [place] [address]   Culture, blood (Routine X 2) w Reflex to ID Panel     Status: None (Preliminary result)   Collection Time: 12/31/20  6:41 AM   Specimen: BLOOD  Result Value Ref Range Status   Specimen Description BLOOD LEFT ANTECUBITAL  Final   Special Requests   Final    BOTTLES DRAWN AEROBIC AND ANAEROBIC Blood Culture adequate volume   Culture   Final    NO GROWTH 3 DAYS Performed at [facility] [address], [place], [place] [address]    Report Status PENDING  Incomplete  Culture, blood (Routine X 2) w Reflex to ID Panel     Status: None (Preliminary result)   Collection Time: 12/31/20  6:59 AM   Specimen: BLOOD LEFT FOREARM  Result  Value Ref Range Status   Specimen Description BLOOD LEFT FOREARM  Final   Special Requests   Final    BOTTLES DRAWN AEROBIC AND ANAEROBIC Blood Culture adequate volume   Culture   Final    NO GROWTH 3 DAYS Performed at [facility], [place] [address] [place], [place] [address]    Report Status PENDING  Incomplete    Procedures and diagnostic studies:  DG CHEST PORT 1 VIEW  Result Date: 01/03/2021 CLINICAL DATA:  Acute respiratory failure EXAM: PORTABLE CHEST 1 VIEW COMPARISON:  01/02/2021 FINDINGS: Cardiac shadow is enlarged. Significant improved aeration in the right lung is noted with persistent basilar airspace opacity. Left lung demonstrates basilar atelectasis. No bony abnormality is seen. IMPRESSION: Significant improved aeration of the right lung as described. Electronically Signed   By: Inez Catalina M.D.   On: 01/03/2021 02:10   DG CHEST PORT 1 VIEW  Result Date: 01/02/2021 CLINICAL DATA:  Acute respiratory failure with hypoxia EXAM: PORTABLE CHEST 1 VIEW COMPARISON:  01/02/2021 FINDINGS: Complete opacification of the right hemithorax. Left lower atelectasis or infiltrate. Heart is normal size. No acute  bony abnormality. IMPRESSION: Complete opacification of the right hemithorax. Left base atelectasis or infiltrate. Electronically Signed   By: Rolm Baptise M.D.   On: 01/02/2021 17:30   DG CHEST PORT 1 VIEW  Result Date: 01/02/2021 CLINICAL DATA:  65 year old male with history of hypoxemia. EXAM: PORTABLE CHEST 1 VIEW COMPARISON:  Chest x-ray 01/01/2021. FINDINGS: Near complete opacification of the right hemithorax. Left basilar opacity which may reflect atelectasis and/or consolidation. Small left pleural effusion. No pneumothorax. Heart size is mildly enlarged. Upper mediastinal contours are distorted by patient positioning. IMPRESSION: 1. Dramatic worsening aeration in the right lung concerning for enlarging large right pleural effusion with worsening atelectasis and/or  consolidation in the underlying lung. 2. Small amount of atelectasis and/or consolidation in the left lower lobe with small left pleural effusion. Electronically Signed   By: Vinnie Langton M.D.   On: 01/02/2021 08:31   ECHOCARDIOGRAM COMPLETE  Result Date: 01/01/2021    ECHOCARDIOGRAM REPORT   Patient Name:   MATHEWS MOW Date of Exam: 01/01/2021 Medical Rec #:  [id number]      Height:       72.0 in Accession #:    [id number]     Weight:       255.7 lb Date of Birth:  1956-05-25      BSA:          2.365 m Patient Age:    77 years       BP:           138/64 mmHg Patient Gender: M              HR:           64 bpm. Exam Location:  Inpatient Procedure: 2D Echo, Cardiac Doppler, Color Doppler and Intracardiac            Opacification Agent Indications:    I48.0 Paroxysmal atrial fibrillation  History:        Patient has prior history of Echocardiogram examinations, most                 recent 12/15/2019. COPD and Stroke; Risk Factors:Hypertension,                 Diabetes and Dyslipidemia. CKD.  Sonographer:    Jonelle Sidle Dance Referring Phys: [id number] Margie Billet IMPRESSIONS  1. Left ventricular ejection fraction, by estimation, is 60 to 65%. The left ventricle has normal function. The left ventricle has no regional wall motion abnormalities. Left ventricular diastolic parameters were normal.  2. Right ventricular systolic function is normal. The right ventricular size is normal.  3. The mitral valve is normal in structure. No evidence of mitral valve regurgitation.  4. The aortic valve is normal in structure. Aortic valve regurgitation is not visualized.  5. Aortic dilatation noted. There is borderline dilatation of the ascending aorta, measuring 39 mm. FINDINGS  Left Ventricle: Left ventricular ejection fraction, by estimation, is 60 to 65%. The left ventricle has normal function. The left ventricle has no regional wall motion abnormalities. Definity contrast agent was given IV to delineate the left ventricular   endocardial borders. The left ventricular internal cavity size was normal in size. There is no left ventricular hypertrophy. Left ventricular diastolic parameters were normal. Right Ventricle: The right ventricular size is normal. Right vetricular wall thickness was not well visualized. Right ventricular systolic function is normal. Left Atrium: Left atrial size was normal in size. Right Atrium: Right atrial size was normal in size. Pericardium: There is  no evidence of pericardial effusion. Mitral Valve: The mitral valve is normal in structure. No evidence of mitral valve regurgitation. Tricuspid Valve: The tricuspid valve is normal in structure. Tricuspid valve regurgitation is trivial. Aortic Valve: The aortic valve is normal in structure. Aortic valve regurgitation is not visualized. Pulmonic Valve: The pulmonic valve was normal in structure. Pulmonic valve regurgitation is not visualized. Aorta: Aortic dilatation noted. There is borderline dilatation of the ascending aorta, measuring 39 mm. IAS/Shunts: The atrial septum is grossly normal.  LEFT VENTRICLE PLAX 2D LVIDd:         5.20 cm  Diastology LVIDs:         3.30 cm  LV e' medial:    5.33 cm/s LV PW:         1.50 cm  LV E/e' medial:  11.4 LV IVS:        1.30 cm  LV e' lateral:   8.27 cm/s LVOT diam:     2.10 cm  LV E/e' lateral: 7.4 LV SV:         46 LV SV Index:   19 LVOT Area:     3.46 cm  RIGHT VENTRICLE             IVC RV S prime:     14.50 cm/s  IVC diam: 1.90 cm TAPSE (M-mode): 2.3 cm LEFT ATRIUM           Index LA diam:      4.70 cm 1.99 cm/m LA Vol (A4C): 49.4 ml 20.89 ml/m  AORTIC VALVE LVOT Vmax:   65.30 cm/s LVOT Vmean:  41.900 cm/s LVOT VTI:    0.133 m  AORTA Ao Root diam: 3.80 cm Ao Asc diam:  3.90 cm MITRAL VALVE MV Area (PHT): 3.58 cm    SHUNTS MV Decel Time: 212 msec    Systemic VTI:  0.13 m MV E velocity: 60.80 cm/s  Systemic Diam: 2.10 cm MV A velocity: 72.00 cm/s MV E/A ratio:  0.84 Mertie Moores MD Electronically signed by Mertie Moores MD Signature Date/Time: 01/01/2021/4:57:26 PM    Final                LOS: 5 days   Zeke Aker  [facility]   Pager on [email or web address]. If 7PM-7AM, please contact night-coverage at [email or web address]     01/03/2021, 12:06 PM

## 2021-01-03 NOTE — Progress Notes (Signed)
[facility]  CXR from this morning reviewed, improved aeration Right lung with chest PT Rec continue current management, needs BIPAP at night Will sign off but we are happy to see him again if CXR worsens  Roselie Awkward, MD [facility] Pager: [phone] Cell: [phone] After 7:00 pm call Elink  [phone]

## 2021-01-03 NOTE — Progress Notes (Signed)
CPT held patient asleep on BIPAP. Will continue CPT as scheduled at [facility].

## 2021-01-03 NOTE — Progress Notes (Signed)
Patient refused CPT at this time.RN notified.

## 2021-01-03 NOTE — Progress Notes (Signed)
RT placed pt on bipap for the night, within 5 minutes pt yelling for help. RT went into pt room pt said " I can not tolerate this." Pt placed back on [place]. RN made aware.

## 2021-01-03 NOTE — Progress Notes (Addendum)
Progress Note  Patient Name: Matthew Gutierrez Date of Encounter: 01/03/2021  Primary Cardiologist: Jenne Campus, MD  Subjective   He is much more alert and responsive and denies pain or SOB  Inpatient Medications    Scheduled Meds:  Chlorhexidine Gluconate Cloth  6 each Topical Daily   collagenase   Topical Daily   divalproex  125 mg Oral Q12H   And   divalproex  500 mg Oral Q12H   gabapentin  300 mg Oral QHS   guaiFENesin  5 mL Oral BID   insulin aspart  0-9 Units Subcutaneous Q4H   metoprolol tartrate  12.5 mg Oral BID   phenytoin (DILANTIN) IV  100 mg Intravenous Q12H   sodium chloride flush  5 mL Intracatheter Q12H   sodium chloride HYPERTONIC  4 mL Nebulization Daily   Continuous Infusions:  cefTRIAXone (ROCEPHIN)  IV Stopped (01/02/21 1223)   heparin 2,100 Units/hr (01/03/21 0600)   PRN Meds: acetaminophen **OR** acetaminophen, albuterol, hydrALAZINE, metoprolol tartrate, ondansetron **OR** ondansetron (ZOFRAN) IV, traZODone   Vital Signs    Vitals:   01/03/21 0827 01/03/21 0828 01/03/21 0829 01/03/21 0830  BP:      Pulse: (!) 58 62 (!) 59 (!) 58  Resp: (!) '22 16 16 '$ (!) 21  Temp:      TempSrc:      SpO2: 100% 100% 100% 99%  Weight:      Height:        Intake/Output Summary (Last 24 hours) at 01/03/2021 0900 Last data filed at 01/03/2021 0600 Gross per 24 hour  Intake 1128.55 ml  Output 1060 ml  Net 68.55 ml   Last 3 Weights 01/03/2021 01/01/2021 01/01/2021  Weight (lbs) 254 lb 10.1 oz 255 lb 11.7 oz 255 lb 11.7 oz  Weight (kg) 115.5 kg 116 kg 116 kg     Telemetry    NSR, SB. - Personally Reviewed   Physical Exam   GEN: No  acute distress.   Neck: No  JVD Cardiac: RRR, no murmurs, rubs, or gallops.  Respiratory:     Decreased breath sounds but no wheezing bilaterally. GI: Soft, nontender, non-distended, normal bowel sounds  MS:  No edema; No deformity. Neuro:   Nonfocal  Psych:     Flat affect   Labs    High Sensitivity Troponin:  No  results for input(s): TROPONINIHS in the last 720 hours.    Cardiac EnzymesNo results for input(s): TROPONINI in the last 168 hours. No results for input(s): TROPIPOC in the last 168 hours.   Chemistry Recent Labs  Lab 01/01/21 0723 01/02/21 0406 01/03/21 0445  NA 139 139 138  K 3.1* 3.2* 4.0  CL 100 99 100  CO2 '28 30 28  '$ GLUCOSE 208* 129* 145*  BUN 43* 40* 38*  CREATININE 2.08* 2.06* 1.88*  CALCIUM 8.7* 8.9 8.7*  PROT 6.0* 6.3* 6.0*  ALBUMIN 1.6* 1.7* 1.7*  AST 98* 89* 60*  ALT 38 41 35  ALKPHOS 234* 267* 279*  BILITOT 3.7* 3.9* 4.1*  GFRNONAA 34* 35* 39*  ANIONGAP '11 10 10     '$ Hematology Recent Labs  Lab 01/01/21 0723 01/02/21 0406 01/03/21 0221  WBC 11.5* 10.1 10.5  RBC 3.66* 3.67* 3.86*  HGB 9.9* 9.8* 10.7*  HCT 32.2* 32.4* 34.1*  MCV 88.0 88.3 88.3  MCH 27.0 26.7 27.7  MCHC 30.7 30.2 31.4  RDW 17.0* 17.3* 17.6*  PLT 153 149* 119*    BNP Recent Labs  Lab 12/31/20 1449  BNP 374.1*  DDimer No results for input(s): DDIMER in the last 168 hours.   Radiology    DG CHEST PORT 1 VIEW  Result Date: 01/03/2021 CLINICAL DATA:  Acute respiratory failure EXAM: PORTABLE CHEST 1 VIEW COMPARISON:  01/02/2021 FINDINGS: Cardiac shadow is enlarged. Significant improved aeration in the right lung is noted with persistent basilar airspace opacity. Left lung demonstrates basilar atelectasis. No bony abnormality is seen. IMPRESSION: Significant improved aeration of the right lung as described. Electronically Signed   By: Inez Catalina M.D.   On: 01/03/2021 02:10   DG CHEST PORT 1 VIEW  Result Date: 01/02/2021 CLINICAL DATA:  Acute respiratory failure with hypoxia EXAM: PORTABLE CHEST 1 VIEW COMPARISON:  01/02/2021 FINDINGS: Complete opacification of the right hemithorax. Left lower atelectasis or infiltrate. Heart is normal size. No acute bony abnormality. IMPRESSION: Complete opacification of the right hemithorax. Left base atelectasis or infiltrate. Electronically Signed    By: Rolm Baptise M.D.   On: 01/02/2021 17:30   DG CHEST PORT 1 VIEW  Result Date: 01/02/2021 CLINICAL DATA:  65 year old male with history of hypoxemia. EXAM: PORTABLE CHEST 1 VIEW COMPARISON:  Chest x-ray 01/01/2021. FINDINGS: Near complete opacification of the right hemithorax. Left basilar opacity which may reflect atelectasis and/or consolidation. Small left pleural effusion. No pneumothorax. Heart size is mildly enlarged. Upper mediastinal contours are distorted by patient positioning. IMPRESSION: 1. Dramatic worsening aeration in the right lung concerning for enlarging large right pleural effusion with worsening atelectasis and/or consolidation in the underlying lung. 2. Small amount of atelectasis and/or consolidation in the left lower lobe with small left pleural effusion. Electronically Signed   By: Vinnie Langton M.D.   On: 01/02/2021 08:31   ECHOCARDIOGRAM COMPLETE  Result Date: 01/01/2021    ECHOCARDIOGRAM REPORT   Patient Name:   Matthew Gutierrez Date of Exam: 01/01/2021 Medical Rec #:  [id number]      Height:       72.0 in Accession #:    [id number]     Weight:       255.7 lb Date of Birth:  [birth year]      BSA:          2.365 m Patient Age:    65 years       BP:           138/64 mmHg Patient Gender: M              HR:           64 bpm. Exam Location:  Inpatient Procedure: 2D Echo, Cardiac Doppler, Color Doppler and Intracardiac            Opacification Agent Indications:    I48.0 Paroxysmal atrial fibrillation  History:        Patient has prior history of Echocardiogram examinations, most                 recent 12/15/2019. COPD and Stroke; Risk Factors:Hypertension,                 Diabetes and Dyslipidemia. CKD.  Sonographer:    Jonelle Sidle Dance Referring Phys: [id number] Margie Billet IMPRESSIONS  1. Left ventricular ejection fraction, by estimation, is 60 to 65%. The left ventricle has normal function. The left ventricle has no regional wall motion abnormalities. Left ventricular diastolic parameters  were normal.  2. Right ventricular systolic function is normal. The right ventricular size is normal.  3. The mitral valve is normal in structure. No evidence of mitral valve regurgitation.  4. The aortic valve is normal in structure. Aortic valve regurgitation is not visualized.  5. Aortic dilatation noted. There is borderline dilatation of the ascending aorta, measuring 39 mm. FINDINGS  Left Ventricle: Left ventricular ejection fraction, by estimation, is 60 to 65%. The left ventricle has normal function. The left ventricle has no regional wall motion abnormalities. Definity contrast agent was given IV to delineate the left ventricular  endocardial borders. The left ventricular internal cavity size was normal in size. There is no left ventricular hypertrophy. Left ventricular diastolic parameters were normal. Right Ventricle: The right ventricular size is normal. Right vetricular wall thickness was not well visualized. Right ventricular systolic function is normal. Left Atrium: Left atrial size was normal in size. Right Atrium: Right atrial size was normal in size. Pericardium: There is no evidence of pericardial effusion. Mitral Valve: The mitral valve is normal in structure. No evidence of mitral valve regurgitation. Tricuspid Valve: The tricuspid valve is normal in structure. Tricuspid valve regurgitation is trivial. Aortic Valve: The aortic valve is normal in structure. Aortic valve regurgitation is not visualized. Pulmonic Valve: The pulmonic valve was normal in structure. Pulmonic valve regurgitation is not visualized. Aorta: Aortic dilatation noted. There is borderline dilatation of the ascending aorta, measuring 39 mm. IAS/Shunts: The atrial septum is grossly normal.  LEFT VENTRICLE PLAX 2D LVIDd:         5.20 cm  Diastology LVIDs:         3.30 cm  LV e' medial:    5.33 cm/s LV PW:         1.50 cm  LV E/e' medial:  11.4 LV IVS:        1.30 cm  LV e' lateral:   8.27 cm/s LVOT diam:     2.10 cm  LV E/e'  lateral: 7.4 LV SV:         46 LV SV Index:   19 LVOT Area:     3.46 cm  RIGHT VENTRICLE             IVC RV S prime:     14.50 cm/s  IVC diam: 1.90 cm TAPSE (M-mode): 2.3 cm LEFT ATRIUM           Index LA diam:      4.70 cm 1.99 cm/m LA Vol (A4C): 49.4 ml 20.89 ml/m  AORTIC VALVE LVOT Vmax:   65.30 cm/s LVOT Vmean:  41.900 cm/s LVOT VTI:    0.133 m  AORTA Ao Root diam: 3.80 cm Ao Asc diam:  3.90 cm MITRAL VALVE MV Area (PHT): 3.58 cm    SHUNTS MV Decel Time: 212 msec    Systemic VTI:  0.13 m MV E velocity: 60.80 cm/s  Systemic Diam: 2.10 cm MV A velocity: 72.00 cm/s MV E/A ratio:  0.84 Mertie Moores MD Electronically signed by Mertie Moores MD Signature Date/Time: 01/01/2021/4:57:26 PM    Final     Cardiac Studies   Echo:    1. Left ventricular ejection fraction, by estimation, is 60 to 65%. The  left ventricle has normal function. The left ventricle has no regional  wall motion abnormalities. Left ventricular diastolic parameters were  normal.   2. Right ventricular systolic function is normal. The right ventricular  size is normal.   3. The mitral valve is normal in structure. No evidence of mitral valve  regurgitation.   4. The aortic valve is normal in structure. Aortic valve regurgitation is  not visualized.   5. Aortic dilatation  noted. There is borderline dilatation of the  ascending aorta, measuring 39 mm.   Patient Profile     65 y.o. male SNF pt with HTN, HLD, type 2 DM, epilepsy, CVA, COPD, paroxysmal A fib on coumadin, CKD III, OSA on CPAP, PVD, COPD, hx of DVT, chronic lymphedema, and obesity presented with multiple medical issues. Initially sepsis with biliary source, but decompensated 8/4 with hypoxemia, R lung atelectasis with volume loss and mediastinal shift to the right, suspected mucus plugging. Cardiology following for afib RVR.  Assessment & Plan    Paroxysmal atrial fib Maintaining NSR.     Matthew Gutierrez has a CHA2DS2 - VASc score of 7.   He is on heparin with  suggestion from GI that he could restart warfarin 5 days after ERCP.  Looking through his chart we don't see a contraindication to DOAC instead.  We will defer to the primary team.    (Addendum, if he is to remain on phenytoin he would need warfarin.)   Hx of HTN with hypotension this admission BPs are now down again.  HR is low.  Will stop beta blocker.   Chronic diastolic HF Appears to be euvolemic with IV Lasix two days ago.  No diuretic yesterday.  CXR has improved. .    CKD IIIB Creat is improved.    We will see as needed.    For questions or updates, please contact [place] Please consult [email or web address] for contact info under Cardiology/STEMI.  Signed, Minus Breeding, MD 01/03/2021, 9:00 AM

## 2021-01-04 DIAGNOSIS — G40909 Epilepsy, unspecified, not intractable, without status epilepticus: Secondary | ICD-10-CM

## 2021-01-04 DIAGNOSIS — Z7901 Long term (current) use of anticoagulants: Secondary | ICD-10-CM

## 2021-01-04 LAB — GLUCOSE, CAPILLARY
Glucose-Capillary: 112 mg/dL — ABNORMAL HIGH (ref 70–99)
Glucose-Capillary: 126 mg/dL — ABNORMAL HIGH (ref 70–99)
Glucose-Capillary: 141 mg/dL — ABNORMAL HIGH (ref 70–99)
Glucose-Capillary: 148 mg/dL — ABNORMAL HIGH (ref 70–99)
Glucose-Capillary: 153 mg/dL — ABNORMAL HIGH (ref 70–99)
Glucose-Capillary: 155 mg/dL — ABNORMAL HIGH (ref 70–99)
Glucose-Capillary: 164 mg/dL — ABNORMAL HIGH (ref 70–99)
Glucose-Capillary: 169 mg/dL — ABNORMAL HIGH (ref 70–99)

## 2021-01-04 LAB — PROTIME-INR
INR: 1.2 (ref 0.8–1.2)
Prothrombin Time: 15.3 seconds — ABNORMAL HIGH (ref 11.4–15.2)

## 2021-01-04 LAB — MAGNESIUM: Magnesium: 1.9 mg/dL (ref 1.7–2.4)

## 2021-01-04 LAB — CBC
HCT: 33.1 % — ABNORMAL LOW (ref 39.0–52.0)
Hemoglobin: 9.9 g/dL — ABNORMAL LOW (ref 13.0–17.0)
MCH: 26.5 pg (ref 26.0–34.0)
MCHC: 29.9 g/dL — ABNORMAL LOW (ref 30.0–36.0)
MCV: 88.5 fL (ref 80.0–100.0)
Platelets: 149 10*3/uL — ABNORMAL LOW (ref 150–400)
RBC: 3.74 MIL/uL — ABNORMAL LOW (ref 4.22–5.81)
RDW: 17.2 % — ABNORMAL HIGH (ref 11.5–15.5)
WBC: 10.9 10*3/uL — ABNORMAL HIGH (ref 4.0–10.5)
nRBC: 0 % (ref 0.0–0.2)

## 2021-01-04 LAB — HEPARIN LEVEL (UNFRACTIONATED): Heparin Unfractionated: 0.37 IU/mL (ref 0.30–0.70)

## 2021-01-04 MED ORDER — FENTANYL CITRATE (PF) 100 MCG/2ML IJ SOLN
INTRAMUSCULAR | Status: AC
  Filled 2021-01-04: qty 2

## 2021-01-04 MED ORDER — WARFARIN SODIUM 3 MG PO TABS
3.0000 mg | ORAL_TABLET | Freq: Once | ORAL | Status: AC
  Administered 2021-01-04: 3 mg via ORAL
  Filled 2021-01-04: qty 1

## 2021-01-04 NOTE — Progress Notes (Signed)
[facility] for Heparin/Coumadin Indication: Afib, history of DVT  Allergies  Allergen Reactions   Codeine Other (See Comments)    Unknown Reaction    Patient Measurements: Height: 6' (182.9 cm) Weight: 115.5 kg (254 lb 10.1 oz) IBW/kg (Calculated) : 77.6 Heparin dosing weight: 102.7 kg  Vital Signs: Temp: 97.8 F (36.6 C) (08/08 0744) Temp Source: Oral (08/08 0744) BP: 151/82 (08/08 0700) Pulse Rate: 59 (08/08 0700)  Labs: Recent Labs    01/02/21 0406 01/03/21 0221 01/03/21 0445 01/04/21 0651  HGB 9.8* 10.7*  --  9.9*  HCT 32.4* 34.1*  --  33.1*  PLT 149* 119*  --  149*  LABPROT 15.9*  --   --  15.3*  INR 1.3*  --   --  1.2  HEPARINUNFRC 0.62  --  0.50 0.37  CREATININE 2.06*  --  1.88*  --      Estimated Creatinine Clearance: 50.7 mL/min (A) (by C-G formula based on SCr of 1.88 mg/dL (H)).   Assessment: 65 y/o M from SNF with hypoxemia. Unable to follow commands in the ED. Having some abdominal pain. On warfarin PTA for history of DVT and Afib.  Of note, RN reports that patient was not on anticoagulation on arrival to SNF and he has a fall risk; therefore, anticoagulation was not resumed.   Underwent cholecystostomy tube placement 8/2 and ERCP on 8/3 with choledocholithiasis found, sphincterotomy performed. Minor bleeding at sphincterotomy site treated with epinephrine.  IV heparin resumed post procedure and Coumadin added 01/03/21.  Heparin level remains therapeutic at 0.37 units/mL.  INR sub-therapeutic as expected.  Hemoglobin relatively stable and platelet count improving; no bleeding reported.  PO intake fair. Dilantin may impact INR. Off amiodarone on 8/5.    Goal of Therapy:  Heparin level 0.3-0.7 units/mL Monitor platelets by anticoagulation protocol: Yes   Plan:  Continue heparin infusion at 2100 units/hr Coumadin '3mg'$  PO today Daily heparin level, PT / INR and CBC  Eevee Borbon D. Mina Marble, PharmD, BCPS, [place] 01/04/2021, 8:55  AM

## 2021-01-04 NOTE — Progress Notes (Signed)
CPT not done at this time. Patient is sleeping. RN at bedside.

## 2021-01-04 NOTE — Progress Notes (Signed)
Physical Therapy Treatment Patient Details Name: Matthew Gutierrez MRN: [id number] DOB: 1956/02/22 Today's Date: 01/04/2021    History of Present Illness Matthew Gutierrez is a 65 y.o. male with medical history significant for DMT2, CKD 3, a-fib, COPD, neuropathy due to diabetes, history of stroke, OSA, PVD who presents by EMS from skilled nursing facility for evaluation of altered mental status with hypoxia.  Reportedly patient is normally conversational at his baseline. Patient is found to be febrile with a temperature of 101.4 degrees.  He is also found to be septic with elevated lactic acid, encephalopathy, tachycardia, tachypnea and imaging revealed acute cholecystitis.  He has elevated bilirubin level.  Surgery was consulted and recommended MRCP in light of his clinical situation.    PT Comments    Conflicting information in chart regarding pt's prior functional status, therefore called and spoke to staff at [facility]. They reported pt was able to stand-pivot with 1 person assist up until ~4 weeks ago when he became too weak. He had been lifted OOB x 4 weeks with mechanical lift. Pt very lethargic this session, but states he wants to work with therapy with goal of standing with RW. Pt continued to fall asleep throughout LE strengthening exercises. Will continue trial of PT 2x/wk x 2 weeks to assess ability to participate and potentially benefit from PT.     Follow Up Recommendations  SNF;Supervision/Assistance - 24 hour     Equipment Recommendations  None recommended by PT    Recommendations for Other Services       Precautions / Restrictions Precautions Precautions: Fall Precaution Comments: HOH Restrictions Other Position/Activity Restrictions: patient has B foot/heel wounds, pressure relief boots donned.    Mobility  Bed Mobility                    Transfers                 General transfer comment: Deferred; patient dependent on mechanical lift at  baseline.  Ambulation/Gait             General Gait Details: unable   Stairs             Wheelchair Mobility    Modified Rankin (Stroke Patients Only)       Balance                                            Cognition Arousal/Alertness: Lethargic Behavior During Therapy: Flat affect Overall Cognitive Status: No family/caregiver present to determine baseline cognitive functioning                                 General Comments: pt too lethargic to answer questions and required constant stimulation to participate with minimal exercises      Exercises General Exercises - Lower Extremity Ankle Circles/Pumps: PROM;Both;10 reps Heel Slides: AAROM;Both;5 reps (assisted flexion, resisted extension) Hip ABduction/ADduction: PROM;Both;5 reps Other Exercises Other Exercises: rt hip internal rotation PROM with prolonged hold; able to achieve neutral    General Comments General comments (skin integrity, edema, etc.): Contacted staff at [facility] and they report pt had been getting up with 1 person assist with RW until he began to get weaker and for ~1 month they have been using mechanical lift      Pertinent Vitals/Pain Pain  Assessment: Faces Faces Pain Scale: No hurt    Home Living                      Prior Function            PT Goals (current goals can now be found in the care plan section) Acute Rehab PT Goals Patient Stated Goal: States he wants to be abel to stand with walker PT Goal Formulation: Patient unable to participate in goal setting Time For Goal Achievement: 01/13/21 Progress towards PT goals: Not progressing toward goals - comment (lethargic)    Frequency    Min 2X/week      PT Plan Current plan remains appropriate    Co-evaluation              AM-PAC PT "6 Clicks" Mobility   Outcome Measure  Help needed turning from your back to your side while in a flat bed without using  bedrails?: Total Help needed moving from lying on your back to sitting on the side of a flat bed without using bedrails?: Total Help needed moving to and from a bed to a chair (including a wheelchair)?: Total Help needed standing up from a chair using your arms (e.g., wheelchair or bedside chair)?: Total Help needed to walk in hospital room?: Total Help needed climbing 3-5 steps with a railing? : Total 6 Click Score: 6    End of Session Equipment Utilized During Treatment: Oxygen Activity Tolerance: Patient limited by lethargy;Other (comment) (cognitive limitations at this time) Patient left: in bed;with call bell/phone within reach Nurse Communication: Need for lift equipment PT Visit Diagnosis: Other abnormalities of gait and mobility (R26.89);Muscle weakness (generalized) (M62.81)     Time: [id number] PT Time Calculation (min) (ACUTE ONLY): 12 min  Charges:  $Therapeutic Exercise: 8-22 mins                      Arby Barrette, PT Pager [phone]    Rexanne Mano 01/04/2021, 2:16 PM

## 2021-01-04 NOTE — Progress Notes (Signed)
Progress Note    Matthew Gutierrez  [id number] DOB: April 23, 1956  DOA: 12/28/2020 PCP: Lillard Anes, MD      Brief Narrative:    Medical records reviewed and are as summarized below:  Matthew Gutierrez is a 65 y.o. male  with medical history significant for DMT2, CKD 3, a-fib, COPD, neuropathy due to diabetes, history of stroke, OSA, PVD, who was brought to the hospital from nursing home because of altered mental status and hypoxia with oxygen saturation in the 80s.  He was admitted to the hospital for acute ascending cholangitis.  He was treated with empiric antibiotics.  Cholecystostomy tube was placed by IR on 12/29/2020.  He underwent ERCP on 12/30/2020.  Following the procedure, he developed atrial fibrillation with RVR and right lung atelectasis with mediastinal shift felt to be due to aspiration pneumonia and mucous plugging.    Assessment/Plan:   Principal Problem:   Acute cholecystitis Active Problems:   Essential hypertension   H/O: stroke   Acute renal failure superimposed on stage 3 chronic kidney disease (HCC)   COPD (chronic obstructive pulmonary disease) ([facility])   Long term (current) use of anticoagulants   Obstructive sleep apnea   Paroxysmal atrial fibrillation (HCC)   Sepsis (HCC)   Seizure disorder (HCC)   Encephalopathy   Abnormal magnetic resonance cholangiopancreatography (MRCP)   Calculus of bile duct with acute cholangitis with obstruction   Elevated LFTs   Acute respiratory distress    Body mass index is 34.53 kg/m.  (Obesity)   Sepsis secondary to ascending cholangitis and cholecystitis due to Klebsiella pneumoniae, aspiration pneumonia: S/p cholecystostomy tube placed on 12/29/2020.  S/p ERCP with biliary sphincterotomy on 12/30/2020.  Discontinue IV Rocephin (completed 7 days of treatment)  Acute on chronic hypoxic and hypercapnic respiratory failure: Continue 2 L/min oxygen via nasal cannula.  Acute toxic metabolic encephalopathy:  Improved.  COPD, OSA: Continue bronchodilators  Atrial fibrillation with RVR: Continue IV heparin infusion.  Warfarin was started last night.  Monitor heparin level and INR per protocol.  Seizure disorder: Continue Dilantin and Depakote.  Check Dilantin level tomorrow.  CKD stage IIIb: Creatinine stable  Unstageable bilateral heel, left and right ankle decubitus ulcers (all present on admission): Continue local wound care.   Diet Order             DIET DYS 3 Room service appropriate? No; Fluid consistency: Thin  Diet effective now                      Consultants: Cardiologist Intensivist General surgeon Gastroenterologist  Procedures: Cholecystostomy tube placement on 12/29/2020 ERCP with biliary sphincterectomy on 12/30/2020.     Medications:    Chlorhexidine Gluconate Cloth  6 each Topical Daily   collagenase   Topical Daily   divalproex  125 mg Oral Q12H   And   divalproex  500 mg Oral Q12H   gabapentin  300 mg Oral QHS   guaiFENesin  5 mL Oral BID   insulin aspart  0-9 Units Subcutaneous Q4H   phenytoin (DILANTIN) IV  100 mg Intravenous Q12H   sodium chloride flush  5 mL Intracatheter Q12H   warfarin  3 mg Oral ONCE-1600   Warfarin - Pharmacist Dosing Inpatient   Does not apply q1600   Continuous Infusions:  cefTRIAXone (ROCEPHIN)  IV 2 g (01/04/21 1224)   heparin 2,100 Units/hr (01/04/21 0928)     Anti-infectives (From admission, onward)    Start  Dose/Rate Route Frequency Ordered Stop   12/31/20 1245  cefTRIAXone (ROCEPHIN) 2 g in sodium chloride 0.9 % 100 mL IVPB        2 g 200 mL/hr over 30 Minutes Intravenous Every 24 hours 12/31/20 1152     12/30/20 1300  metroNIDAZOLE (FLAGYL) IVPB 500 mg  Status:  Discontinued        500 mg 100 mL/hr over 60 Minutes Intravenous Every 12 hours 12/30/20 1203 12/31/20 1340   12/29/20 2200  vancomycin (VANCOREADY) IVPB 1250 mg/250 mL  Status:  Discontinued        1,250 mg 166.7 mL/hr over 90 Minutes  Intravenous Every 24 hours 12/29/20 0006 12/29/20 1831   12/29/20 2100  cefTRIAXone (ROCEPHIN) 2 g in sodium chloride 0.9 % 100 mL IVPB  Status:  Discontinued        2 g 200 mL/hr over 30 Minutes Intravenous Every 24 hours 12/29/20 1831 12/31/20 1152   12/29/20 1000  ceFEPIme (MAXIPIME) 2 g in sodium chloride 0.9 % 100 mL IVPB  Status:  Discontinued        2 g 200 mL/hr over 30 Minutes Intravenous Every 12 hours 12/29/20 0200 12/29/20 1831   12/29/20 0600  piperacillin-tazobactam (ZOSYN) IVPB 3.375 g  Status:  Discontinued       See Hyperspace for full Linked Orders Report.   3.375 g 12.5 mL/hr over 240 Minutes Intravenous Every 8 hours 12/28/20 2244 12/29/20 0200   12/29/20 0200  metroNIDAZOLE (FLAGYL) IVPB 500 mg  Status:  Discontinued        500 mg 100 mL/hr over 60 Minutes Intravenous Every 8 hours 12/29/20 0154 12/29/20 1831   12/29/20 0000  vancomycin (VANCOREADY) IVPB 1250 mg/250 mL  Status:  Discontinued        1,250 mg 166.7 mL/hr over 90 Minutes Intravenous Every 24 hours 12/28/20 2246 12/29/20 0006   12/28/20 2245  vancomycin (VANCOCIN) 2,500 mg in sodium chloride 0.9 % 500 mL IVPB        2,500 mg 250 mL/hr over 120 Minutes Intravenous  Once 12/28/20 2244 12/29/20 0205   12/28/20 2245  piperacillin-tazobactam (ZOSYN) IVPB 3.375 g       See Hyperspace for full Linked Orders Report.   3.375 g 100 mL/hr over 30 Minutes Intravenous  Once 12/28/20 2244 12/28/20 2319              Family Communication/Anticipated D/C date and plan/Code Status   DVT prophylaxis:  warfarin (COUMADIN) tablet 3 mg     Code Status: Full Code  Family Communication: None Disposition Plan:    Status is: Inpatient  Remains inpatient appropriate because:IV treatments appropriate due to intensity of illness or inability to take PO and Inpatient level of care appropriate due to severity of illness  Dispo: The patient is from: Home              Anticipated d/c is to: Home               Patient currently is not medically stable to d/c.   Difficult to place patient No           Subjective:   Interval events noted.  He complains of fatigue  Objective:    Vitals:   01/04/21 1136 01/04/21 1200 01/04/21 1300 01/04/21 1400  BP:  (!) 154/64 114/60 (!) 107/51  Pulse:  (!) 56 63 (!) 58  Resp:  (!) 21 18 (!) 23  Temp: 98.5 F (36.9 C)  TempSrc: Axillary     SpO2:  99% 96% 100%  Weight:      Height:       No data found.   Intake/Output Summary (Last 24 hours) at 01/04/2021 1521 Last data filed at 01/04/2021 0951 Gross per 24 hour  Intake 1299.96 ml  Output 600 ml  Net 699.96 ml   Filed Weights   01/01/21 0424 01/01/21 1000 01/03/21 0500  Weight: 116 kg 116 kg 115.5 kg    Exam:  GEN: NAD SKIN: Warm and dry.  Unstageable left heel decubitus ulcer, unstageable left lateral ankle decubitus ulcer, unstageable left medial ankle decubitus ulcer, unstageable right lateral ankle decubitus ulcer, unstageable right heel decubitus ulcer EYES: No pallor or icterus ENT: MMM CV: RRR PULM: CTA B ABD: soft, ND, NT, +BS, + biliary drain right upper quadrant CNS: AAO x 3, non focal EXT: Edema of the left forearm.  No erythema or tenderness.      Pressure Injury 12/29/20 Heel Left;Lateral Unstageable - Full thickness tissue loss in which the base of the injury is covered by slough (yellow, tan, gray, green or brown) and/or eschar (tan, brown or black) in the wound bed. (Active)  12/29/20 2300  Location: Heel  Location Orientation: Left;Lateral  Staging: Unstageable - Full thickness tissue loss in which the base of the injury is covered by slough (yellow, tan, gray, green or brown) and/or eschar (tan, brown or black) in the wound bed.  Wound Description (Comments):   Present on Admission: Yes     Pressure Injury 12/29/20 Ankle Left;Lateral Unstageable - Full thickness tissue loss in which the base of the injury is covered by slough (yellow, tan, gray, green or  brown) and/or eschar (tan, brown or black) in the wound bed. (Active)  12/29/20 2300  Location: Ankle  Location Orientation: Left;Lateral  Staging: Unstageable - Full thickness tissue loss in which the base of the injury is covered by slough (yellow, tan, gray, green or brown) and/or eschar (tan, brown or black) in the wound bed.  Wound Description (Comments):   Present on Admission: Yes     Pressure Injury 12/29/20 Ankle Left;Medial;Lateral Unstageable - Full thickness tissue loss in which the base of the injury is covered by slough (yellow, tan, gray, green or brown) and/or eschar (tan, brown or black) in the wound bed. (Active)  12/29/20 2300  Location: Ankle  Location Orientation: Left;Medial;Lateral  Staging: Unstageable - Full thickness tissue loss in which the base of the injury is covered by slough (yellow, tan, gray, green or brown) and/or eschar (tan, brown or black) in the wound bed.  Wound Description (Comments):   Present on Admission: Yes     Pressure Injury 12/29/20 Ankle Right;Lateral Unstageable - Full thickness tissue loss in which the base of the injury is covered by slough (yellow, tan, gray, green or brown) and/or eschar (tan, brown or black) in the wound bed. (Active)  12/29/20 2300  Location: Ankle  Location Orientation: Right;Lateral  Staging: Unstageable - Full thickness tissue loss in which the base of the injury is covered by slough (yellow, tan, gray, green or brown) and/or eschar (tan, brown or black) in the wound bed.  Wound Description (Comments):   Present on Admission: Yes     Pressure Injury 12/29/20 Heel Right;Lateral Unstageable - Full thickness tissue loss in which the base of the injury is covered by slough (yellow, tan, gray, green or brown) and/or eschar (tan, brown or black) in the wound bed. (Active)  12/29/20 2300  Location: Heel  Location Orientation: Right;Lateral  Staging: Unstageable - Full thickness tissue loss in which the base of the  injury is covered by slough (yellow, tan, gray, green or brown) and/or eschar (tan, brown or black) in the wound bed.  Wound Description (Comments):   Present on Admission: Yes     Pressure Injury Foot Right;Lateral Unstageable - Full thickness tissue loss in which the base of the injury is covered by slough (yellow, tan, gray, green or brown) and/or eschar (tan, brown or black) in the wound bed. (Active)     Location: Foot  Location Orientation: Right;Lateral  Staging: Unstageable - Full thickness tissue loss in which the base of the injury is covered by slough (yellow, tan, gray, green or brown) and/or eschar (tan, brown or black) in the wound bed.  Wound Description (Comments):   Present on Admission: Yes     Data Reviewed:   I have personally reviewed following labs and imaging studies:  Labs: Labs show the following:   Basic Metabolic Panel: Recent Labs  Lab 12/30/20 0032 12/31/20 0641 01/01/21 0446 01/01/21 0723 01/02/21 0406 01/03/21 0445 01/04/21 0651  NA 138 138 140 139 139 138  --   K 3.4* 4.1 3.1* 3.1* 3.2* 4.0  --   CL 96* 99  --  100 99 100  --   CO2 29 26  --  '28 30 28  '$ --   GLUCOSE 181* 238*  --  208* 129* 145*  --   BUN 28* 35*  --  43* 40* 38*  --   CREATININE 1.71* 2.13*  --  2.08* 2.06* 1.88*  --   CALCIUM 9.1 8.8*  --  8.7* 8.9 8.7*  --   MG 1.4* 1.8  --  2.8* 2.3 2.0 1.9  PHOS  --   --   --  3.2  --  3.1  --    GFR Estimated Creatinine Clearance: 50.7 mL/min (A) (by C-G formula based on SCr of 1.88 mg/dL (H)). Liver Function Tests: Recent Labs  Lab 12/30/20 0032 12/31/20 0641 01/01/21 0723 01/02/21 0406 01/03/21 0445  AST 54* 67* 98* 89* 60*  ALT 26 28 38 41 35  ALKPHOS 200* 212* 234* 267* 279*  BILITOT 4.4* 4.7* 3.7* 3.9* 4.1*  PROT 6.7 6.2* 6.0* 6.3* 6.0*  ALBUMIN 1.8* 1.5* 1.6* 1.7* 1.7*   No results for input(s): LIPASE, AMYLASE in the last 168 hours. Recent Labs  Lab 12/28/20 2200  AMMONIA 16   Coagulation profile Recent Labs   Lab 12/30/20 0032 12/31/20 0641 01/01/21 0723 01/02/21 0406 01/04/21 0651  INR 1.4* 1.4* 1.3* 1.3* 1.2    CBC: Recent Labs  Lab 12/28/20 2141 12/29/20 0340 12/30/20 0032 12/31/20 0641 01/01/21 0446 01/01/21 0723 01/02/21 0406 01/03/21 0221 01/04/21 0651  WBC 20.4*   < > 9.3 13.1*  --  11.5* 10.1 10.5 10.9*  NEUTROABS 17.7*  --  7.4  --   --  8.9*  --   --   --   HGB 11.9*   < > 11.3* 9.9* 10.5* 9.9* 9.8* 10.7* 9.9*  HCT 38.6*   < > 36.2* 33.9* 31.0* 32.2* 32.4* 34.1* 33.1*  MCV 86.2   < > 87.2 90.9  --  88.0 88.3 88.3 88.5  PLT 198   < > 142* 142*  --  153 149* 119* 149*   < > = values in this interval not displayed.   Cardiac Enzymes: No results for input(s): CKTOTAL, CKMB, CKMBINDEX, TROPONINI in  the last 168 hours. BNP (last 3 results) No results for input(s): PROBNP in the last 8760 hours. CBG: Recent Labs  Lab 01/03/21 1957 01/04/21 0007 01/04/21 0434 01/04/21 0743 01/04/21 1133  GLUCAP 189* 169* 164* 112* 126*   D-Dimer: No results for input(s): DDIMER in the last 72 hours. Hgb A1c: No results for input(s): HGBA1C in the last 72 hours. Lipid Profile: No results for input(s): CHOL, HDL, LDLCALC, TRIG, CHOLHDL, LDLDIRECT in the last 72 hours. Thyroid function studies: No results for input(s): TSH, T4TOTAL, T3FREE, THYROIDAB in the last 72 hours.  Invalid input(s): FREET3 Anemia work up: No results for input(s): VITAMINB12, FOLATE, FERRITIN, TIBC, IRON, RETICCTPCT in the last 72 hours. Sepsis Labs: Recent Labs  Lab 12/28/20 2145 12/28/20 2230 12/29/20 0340 12/30/20 0032 01/01/21 0723 01/02/21 0406 01/03/21 0221 01/04/21 0651  WBC  --   --  16.7*   < > 11.5* 10.1 10.5 10.9*  LATICACIDVEN 2.2* 2.5* 1.4  --   --   --   --   --    < > = values in this interval not displayed.    Microbiology Recent Results (from the past 240 hour(s))  Resp Panel by RT-PCR (Flu A&B, Covid) Nasopharyngeal Swab     Status: None   Collection Time: 12/28/20  9:42  PM   Specimen: Nasopharyngeal Swab; Nasopharyngeal(NP) swabs in vial transport medium  Result Value Ref Range Status   SARS Coronavirus 2 by RT PCR NEGATIVE NEGATIVE Final    Comment: (NOTE) SARS-CoV-2 target nucleic acids are NOT DETECTED.  The SARS-CoV-2 RNA is generally detectable in upper respiratory specimens during the acute phase of infection. The lowest concentration of SARS-CoV-2 viral copies this assay can detect is 138 copies/mL. A negative result does not preclude SARS-Cov-2 infection and should not be used as the sole basis for treatment or other patient management decisions. A negative result may occur with  improper specimen collection/handling, submission of specimen other than nasopharyngeal swab, presence of viral mutation(s) within the areas targeted by this assay, and inadequate number of viral copies(<138 copies/mL). A negative result must be combined with clinical observations, patient history, and epidemiological information. The expected result is Negative.  Fact Sheet for Patients:  EntrepreneurPulse.com.au  Fact Sheet for Healthcare Providers:  IncredibleEmployment.be  This test is no t yet approved or cleared by the [place] FDA and  has been authorized for detection and/or diagnosis of SARS-CoV-2 by FDA under an Emergency Use Authorization (EUA). This EUA will remain  in effect (meaning this test can be used) for the duration of the COVID-19 declaration under Section 564(b)(1) of the Act, 21 U.S.C.section 360bbb-3(b)(1), unless the authorization is terminated  or revoked sooner.       Influenza A by PCR NEGATIVE NEGATIVE Final   Influenza B by PCR NEGATIVE NEGATIVE Final    Comment: (NOTE) The Xpert Xpress SARS-CoV-2/FLU/RSV plus assay is intended as an aid in the diagnosis of influenza from Nasopharyngeal swab specimens and should not be used as a sole basis for treatment. Nasal washings and aspirates are  unacceptable for Xpert Xpress SARS-CoV-2/FLU/RSV testing.  Fact Sheet for Patients: EntrepreneurPulse.com.au  Fact Sheet for Healthcare Providers: IncredibleEmployment.be  This test is not yet approved or cleared by the [place] FDA and has been authorized for detection and/or diagnosis of SARS-CoV-2 by FDA under an Emergency Use Authorization (EUA). This EUA will remain in effect (meaning this test can be used) for the duration of the COVID-19 declaration under Section 564(b)(1) of  the Act, 21 U.S.C. section 360bbb-3(b)(1), unless the authorization is terminated or revoked.  Performed at [facility], [place] [address], [place], [place] [address]   Culture, blood (Routine x 2)     Status: Abnormal   Collection Time: 12/28/20  9:45 PM   Specimen: BLOOD  Result Value Ref Range Status   Specimen Description BLOOD RIGHT ANTECUBITAL  Final   Special Requests   Final    BOTTLES DRAWN AEROBIC AND ANAEROBIC Blood Culture adequate volume   Culture  Setup Time   Final    GRAM NEGATIVE RODS AEROBIC BOTTLE ONLY CRITICAL RESULT CALLED TO, READ BACK BY AND VERIFIED WITH: RN C.COBB ON [id number] AT [id number] BY [place] Performed at [facility], [place] [address] [place], [place] [address]    Culture KLEBSIELLA PNEUMONIAE (A)  Final   Report Status 12/31/2020 FINAL  Final   Organism ID, Bacteria KLEBSIELLA PNEUMONIAE  Final      Susceptibility   Klebsiella pneumoniae - MIC*    AMPICILLIN >=32 RESISTANT Resistant     CEFAZOLIN <=4 SENSITIVE Sensitive     CEFEPIME <=0.12 SENSITIVE Sensitive     CEFTAZIDIME <=1 SENSITIVE Sensitive     CEFTRIAXONE <=0.25 SENSITIVE Sensitive     CIPROFLOXACIN <=0.25 SENSITIVE Sensitive     GENTAMICIN <=1 SENSITIVE Sensitive     IMIPENEM <=0.25 SENSITIVE Sensitive     TRIMETH/SULFA <=20 SENSITIVE Sensitive     AMPICILLIN/SULBACTAM 8 SENSITIVE Sensitive     PIP/TAZO <=4 SENSITIVE Sensitive     * KLEBSIELLA  PNEUMONIAE  Blood Culture ID Panel (Reflexed)     Status: Abnormal   Collection Time: 12/28/20  9:45 PM  Result Value Ref Range Status   Enterococcus faecalis NOT DETECTED NOT DETECTED Final   Enterococcus Faecium NOT DETECTED NOT DETECTED Final   Listeria monocytogenes NOT DETECTED NOT DETECTED Final   Staphylococcus species NOT DETECTED NOT DETECTED Final   Staphylococcus aureus (BCID) NOT DETECTED NOT DETECTED Final   Staphylococcus epidermidis NOT DETECTED NOT DETECTED Final   Staphylococcus lugdunensis NOT DETECTED NOT DETECTED Final   Streptococcus species NOT DETECTED NOT DETECTED Final   Streptococcus agalactiae NOT DETECTED NOT DETECTED Final   Streptococcus pneumoniae NOT DETECTED NOT DETECTED Final   Streptococcus pyogenes NOT DETECTED NOT DETECTED Final   A.calcoaceticus-baumannii NOT DETECTED NOT DETECTED Final   Bacteroides fragilis NOT DETECTED NOT DETECTED Final   Enterobacterales DETECTED (A) NOT DETECTED Final    Comment: Enterobacterales represent a large order of gram negative bacteria, not a single organism. CRITICAL RESULT CALLED TO, READ BACK BY AND VERIFIED WITH: RN C.COBB ON [id number] AT 1322 BY E.PARRISH    Enterobacter cloacae complex NOT DETECTED NOT DETECTED Final   Escherichia coli NOT DETECTED NOT DETECTED Final   Klebsiella aerogenes NOT DETECTED NOT DETECTED Final   Klebsiella oxytoca NOT DETECTED NOT DETECTED Final   Klebsiella pneumoniae DETECTED (A) NOT DETECTED Final    Comment: CRITICAL RESULT CALLED TO, READ BACK BY AND VERIFIED WITH: RN C.COBB ON [id number] AT 1322 BY E.PARRISH    Proteus species NOT DETECTED NOT DETECTED Final   Salmonella species NOT DETECTED NOT DETECTED Final   Serratia marcescens NOT DETECTED NOT DETECTED Final   Haemophilus influenzae NOT DETECTED NOT DETECTED Final   Neisseria meningitidis NOT DETECTED NOT DETECTED Final   Pseudomonas aeruginosa NOT DETECTED NOT DETECTED Final   Stenotrophomonas maltophilia NOT DETECTED  NOT DETECTED Final   Candida albicans NOT DETECTED NOT DETECTED Final   Candida  auris NOT DETECTED NOT DETECTED Final   Candida glabrata NOT DETECTED NOT DETECTED Final   Candida krusei NOT DETECTED NOT DETECTED Final   Candida parapsilosis NOT DETECTED NOT DETECTED Final   Candida tropicalis NOT DETECTED NOT DETECTED Final   Cryptococcus neoformans/gattii NOT DETECTED NOT DETECTED Final   CTX-M ESBL NOT DETECTED NOT DETECTED Final   Carbapenem resistance IMP NOT DETECTED NOT DETECTED Final   Carbapenem resistance KPC NOT DETECTED NOT DETECTED Final   Carbapenem resistance NDM NOT DETECTED NOT DETECTED Final   Carbapenem resist OXA 48 LIKE NOT DETECTED NOT DETECTED Final   Carbapenem resistance VIM NOT DETECTED NOT DETECTED Final    Comment: Performed at [facility], [place] [address] [place], [place] [address]  Culture, blood (Routine x 2)     Status: Abnormal   Collection Time: 12/28/20 10:25 PM   Specimen: BLOOD  Result Value Ref Range Status   Specimen Description BLOOD LEFT HAND  Final   Special Requests   Final    BOTTLES DRAWN AEROBIC AND ANAEROBIC Blood Culture adequate volume   Culture  Setup Time   Final    GRAM NEGATIVE RODS AEROBIC BOTTLE ONLY GRAM POSITIVE COCCI IN CLUSTERS ANAEROBIC BOTTLE ONLY CRITICAL RESULT CALLED TO, READ BACK BY AND VERIFIED WITH: C PIERCE,PHARMD '@2209'$  12/29/20 MKELLY    Culture (A)  Final    KLEBSIELLA PNEUMONIAE SUSCEPTIBILITIES PERFORMED ON PREVIOUS CULTURE WITHIN THE LAST 5 DAYS. STAPHYLOCOCCUS COHNII THE SIGNIFICANCE OF ISOLATING THIS ORGANISM FROM A SINGLE SET OF BLOOD CULTURES WHEN MULTIPLE SETS ARE DRAWN IS UNCERTAIN. PLEASE NOTIFY THE MICROBIOLOGY DEPARTMENT WITHIN ONE WEEK IF SPECIATION AND SENSITIVITIES ARE REQUIRED. Performed at [facility], [place] [address], [place], [place] [address]    Report Status 01/01/2021 FINAL  Final  Blood Culture ID Panel (Reflexed)     Status: Abnormal   Collection Time: 12/28/20 10:25 PM   Result Value Ref Range Status   Enterococcus faecalis NOT DETECTED NOT DETECTED Final   Enterococcus Faecium NOT DETECTED NOT DETECTED Final   Listeria monocytogenes NOT DETECTED NOT DETECTED Final   Staphylococcus species DETECTED (A) NOT DETECTED Final    Comment: CRITICAL RESULT CALLED TO, READ BACK BY AND VERIFIED WITH: C PIERCE,PHARMD'@2209'$  12/29/20 MKELLY    Staphylococcus aureus (BCID) NOT DETECTED NOT DETECTED Final   Staphylococcus epidermidis NOT DETECTED NOT DETECTED Final   Staphylococcus lugdunensis NOT DETECTED NOT DETECTED Final   Streptococcus species NOT DETECTED NOT DETECTED Final   Streptococcus agalactiae NOT DETECTED NOT DETECTED Final   Streptococcus pneumoniae NOT DETECTED NOT DETECTED Final   Streptococcus pyogenes NOT DETECTED NOT DETECTED Final   A.calcoaceticus-baumannii NOT DETECTED NOT DETECTED Final   Bacteroides fragilis NOT DETECTED NOT DETECTED Final   Enterobacterales NOT DETECTED NOT DETECTED Final   Enterobacter cloacae complex NOT DETECTED NOT DETECTED Final   Escherichia coli NOT DETECTED NOT DETECTED Final   Klebsiella aerogenes NOT DETECTED NOT DETECTED Final   Klebsiella oxytoca NOT DETECTED NOT DETECTED Final   Klebsiella pneumoniae NOT DETECTED NOT DETECTED Final   Proteus species NOT DETECTED NOT DETECTED Final   Salmonella species NOT DETECTED NOT DETECTED Final   Serratia marcescens NOT DETECTED NOT DETECTED Final   Haemophilus influenzae NOT DETECTED NOT DETECTED Final   Neisseria meningitidis NOT DETECTED NOT DETECTED Final   Pseudomonas aeruginosa NOT DETECTED NOT DETECTED Final   Stenotrophomonas maltophilia NOT DETECTED NOT DETECTED Final   Candida albicans NOT DETECTED NOT DETECTED Final   Candida auris NOT  DETECTED NOT DETECTED Final   Candida glabrata NOT DETECTED NOT DETECTED Final   Candida krusei NOT DETECTED NOT DETECTED Final   Candida parapsilosis NOT DETECTED NOT DETECTED Final   Candida tropicalis NOT DETECTED NOT  DETECTED Final   Cryptococcus neoformans/gattii NOT DETECTED NOT DETECTED Final    Comment: Performed at [facility], [place] [address] [place], [place] [address]  Aerobic/Anaerobic Culture w Gram Stain (surgical/deep wound)     Status: None   Collection Time: 12/29/20 11:55 AM   Specimen: Gallbladder; Bile  Result Value Ref Range Status   Specimen Description GALL BLADDER  Final   Special Requests NONE  Final   Gram Stain   Final    FEW WBC PRESENT,BOTH PMN AND MONONUCLEAR FEW GRAM NEGATIVE RODS    Culture   Final    ABUNDANT KLEBSIELLA PNEUMONIAE NO ANAEROBES ISOLATED Performed at [facility], [address], [place], [place] [address]    Report Status 01/03/2021 FINAL  Final   Organism ID, Bacteria KLEBSIELLA PNEUMONIAE  Final      Susceptibility   Klebsiella pneumoniae - MIC*    AMPICILLIN >=32 RESISTANT Resistant     CEFAZOLIN <=4 SENSITIVE Sensitive     CEFEPIME <=0.12 SENSITIVE Sensitive     CEFTAZIDIME <=1 SENSITIVE Sensitive     CEFTRIAXONE <=0.25 SENSITIVE Sensitive     CIPROFLOXACIN <=0.25 SENSITIVE Sensitive     GENTAMICIN <=1 SENSITIVE Sensitive     IMIPENEM <=0.25 SENSITIVE Sensitive     TRIMETH/SULFA <=20 SENSITIVE Sensitive     AMPICILLIN/SULBACTAM 8 SENSITIVE Sensitive     PIP/TAZO <=4 SENSITIVE Sensitive     * ABUNDANT KLEBSIELLA PNEUMONIAE  MRSA Next Gen by PCR, Nasal     Status: None   Collection Time: 12/30/20 11:08 AM   Specimen: Nasal Mucosa; Nasal Swab  Result Value Ref Range Status   MRSA by PCR Next Gen NOT DETECTED NOT DETECTED Final    Comment: (NOTE) The GeneXpert MRSA Assay (FDA approved for NASAL specimens only), is one component of a comprehensive MRSA colonization surveillance program. It is not intended to diagnose MRSA infection nor to guide or monitor treatment for MRSA infections. Test performance is not FDA approved in patients less than 18 years old. Performed at [facility], [place] [address] [place],  [place] [address]   Culture, blood (Routine X 2) w Reflex to ID Panel     Status: None (Preliminary result)   Collection Time: 12/31/20  6:41 AM   Specimen: BLOOD  Result Value Ref Range Status   Specimen Description BLOOD LEFT ANTECUBITAL  Final   Special Requests   Final    BOTTLES DRAWN AEROBIC AND ANAEROBIC Blood Culture adequate volume   Culture   Final    NO GROWTH 3 DAYS Performed at [facility] [address], [place], [place] [address]    Report Status PENDING  Incomplete  Culture, blood (Routine X 2) w Reflex to ID Panel     Status: None (Preliminary result)   Collection Time: 12/31/20  6:59 AM   Specimen: BLOOD LEFT FOREARM  Result Value Ref Range Status   Specimen Description BLOOD LEFT FOREARM  Final   Special Requests   Final    BOTTLES DRAWN AEROBIC AND ANAEROBIC Blood Culture adequate volume   Culture   Final    NO GROWTH 3 DAYS Performed at [facility], [place] [address] [place], [place] [address]    Report Status  PENDING  Incomplete    Procedures and diagnostic studies:  DG CHEST PORT 1 VIEW  Result Date: 01/03/2021 CLINICAL DATA:  Acute respiratory failure EXAM: PORTABLE CHEST 1 VIEW COMPARISON:  01/02/2021 FINDINGS: Cardiac shadow is enlarged. Significant improved aeration in the right lung is noted with persistent basilar airspace opacity. Left lung demonstrates basilar atelectasis. No bony abnormality is seen. IMPRESSION: Significant improved aeration of the right lung as described. Electronically Signed   By: Inez Catalina M.D.   On: 01/03/2021 02:10   DG CHEST PORT 1 VIEW  Result Date: 01/02/2021 CLINICAL DATA:  Acute respiratory failure with hypoxia EXAM: PORTABLE CHEST 1 VIEW COMPARISON:  01/02/2021 FINDINGS: Complete opacification of the right hemithorax. Left lower atelectasis or infiltrate. Heart is normal size. No acute bony abnormality. IMPRESSION: Complete opacification of the right hemithorax. Left base atelectasis or infiltrate. Electronically  Signed   By: Rolm Baptise M.D.   On: 01/02/2021 17:30               LOS: 6 days   Matthew Gutierrez  [facility]   Pager on [email or web address]. If 7PM-7AM, please contact night-coverage at [email or web address]     01/04/2021, 3:21 PM

## 2021-01-04 NOTE — Progress Notes (Signed)
  Speech Language Pathology Treatment: Dysphagia  Patient Details Name: Matthew Gutierrez MRN: [id number] DOB: 10-27-1955 Today's Date: 01/04/2021 Time: 2549-8264 SLP Time Calculation (min) (ACUTE ONLY): 15 min  Assessment / Plan / Recommendation Clinical Impression  Matthew Gutierrez was seen for follow up therapeutic diet tolerance with previously recommended diet of Dysphagia 3 (soft solids) and thin liquids. He was consuming breakfast meal of pancakes and sausage cut into small pieces and water via straw with full assist for feeding from nurse tech. NT denies any s/s aspiration, but does endorse prolonged mastication of solids and occasional pocketing, requiring cues to swallow/clear cheeks. Pt is able to do so one cued. Deficits appear to be largely related to mentation versus physiology. Given prolonged mastication, recommend pt remain on Dys 3 diet with thin liquids. No further ST indicated for dysphagia. Cognitive/mentation deficits appear to be baseline per chart review, but may benefit from ST for cognition if not at baseline.   HPI HPI: 65 year old who lives at SNF with multiple chronic medical issues admitted with ascending cholangitis. Underwent ERCP on 8/3. Acute on chronic hypoxemic and hypercarbic respiratory failure: In the setting of left-sided profound weakness, poor cough mechanics and likely mucous plug leading to complete atelectasis of right lung on chest x-ray.  Underlying OSA.  ABG with significant hypoxemia and hypercarbia. Suspected Aspiration event during ERCP      SLP Plan  Discharge SLP treatment due to (comment);All goals met       Recommendations  Diet recommendations: Dysphagia 3 (mechanical soft);Thin liquid Liquids provided via: Straw Medication Administration: Whole meds with liquid Supervision: Trained caregiver to feed patient;Full supervision/cueing for compensatory strategies                Follow up Recommendations: Skilled Nursing facility;24 hour  supervision/assistance SLP Visit Diagnosis: Dysphagia, oral phase (R13.11) Plan: Discharge SLP treatment due to (comment);All goals met                    Andrew Blasius P. Isabella Ida, M.S., CCC-SLP Speech-Language Pathologist Acute Rehabilitation Services Pager: [place] 01/04/2021, 9:48 AM

## 2021-01-04 NOTE — Progress Notes (Signed)
Referring Physician(s): Dr. Georganna Skeans   Supervising Physician: Corrie Mckusick  Patient Status:  [name] - In-pt  Chief Complaint: Acute cholecystitis s/p percutaneous cholecystostomy 12/29/20  Subjective: Patient awake/alert in bed. RN at the bedside performing nursing care activities. Patient responsive and verbal today; able to answer my questions appropriately.   Allergies: Codeine  Medications: Prior to Admission medications   Medication Sig Start Date End Date Taking? Authorizing Provider  albuterol (PROVENTIL) (2.5 MG/3ML) 0.083% nebulizer solution Inhale 3 mLs (2.5 mg total) into the lungs every 6 (six) hours as needed for wheezing or shortness of breath. 03/18/20  Yes Lillard Anes, MD  aspirin 325 MG tablet Take 325 mg by mouth daily.   Yes [provider]  atorvastatin (LIPITOR) 10 MG tablet Take 10 mg by mouth daily.   Yes [provider]  Cholecalciferol (VITAMIN D3) 1.25 MG (50000 UT) TABS Take 50,000 Units by mouth 2 (two) times a week. Every Wed and Sat   Yes [provider]  divalproex (DEPAKOTE) 125 MG DR tablet Take 125 mg by mouth 2 (two) times daily.   Yes [provider]  divalproex (DEPAKOTE) 500 MG DR tablet Take 500 mg by mouth 2 (two) times daily.   Yes [provider]  Dulaglutide (TRULICITY) [id number] [id number] SOPN Inject 0.75 mg into the skin every Tuesday.   Yes [provider]  DULoxetine (CYMBALTA) 60 MG capsule Take 60 mg by mouth daily.   Yes [provider]  EPINEPHrine 0.3 mg/0.3 mL IJ SOAJ injection Inject 0.3 mg into the muscle as needed for anaphylaxis. 03/18/20  Yes Lillard Anes, MD  fluticasone [facility]) 50 MCG/ACT nasal spray Place 1 spray into both nostrils daily. Patient taking differently: Place 1 spray into both nostrils daily as needed for allergies. 10/30/19  Yes Lillard Anes, MD  gabapentin (NEURONTIN) 300 MG capsule Take 1 capsule (300 mg total) by mouth  at bedtime. 11/18/19  Yes Lillard Anes, MD  glucose blood test strip 1 each by Other route as needed. Use as instructed 03/17/20  Yes Lillard Anes, MD  isosorbide mononitrate (IMDUR) 120 MG 24 hr tablet Take 1 tablet (120 mg total) by mouth daily. 01/16/20  Yes Lillard Anes, MD  metoprolol succinate (TOPROL-XL) 25 MG 24 hr tablet Take 25 mg by mouth daily.   Yes [provider]  polyethylene glycol powder (GLYCOLAX/MIRALAX) 17 GM/SCOOP powder Take 1 Container by mouth every morning.   Yes [provider]  potassium chloride (KLOR-CON) 10 MEQ tablet Take 10 mEq by mouth daily.   Yes [provider]  risperiDONE (RISPERDAL) 1 MG tablet Take 1 mg by mouth 2 (two) times daily.   Yes [provider]  torsemide (DEMADEX) 20 MG tablet Take 20 mg by mouth daily.   Yes [provider]  allopurinol (ZYLOPRIM) 300 MG tablet Take 1 tablet (300 mg total) by mouth daily. Patient not taking: No sig reported 03/18/20   Lillard Anes, MD  carvedilol (COREG) 3.125 MG tablet Take 1 tablet (3.125 mg total) by mouth 2 (two) times daily with a meal. Patient not taking: No sig reported 02/05/20   Lillard Anes, MD  diltiazem (CARDIZEM) 60 MG tablet Take 1 tablet (60 mg total) by mouth 2 (two) times daily. Patient not taking: No sig reported 02/05/20   Lillard Anes, MD  metFORMIN (GLUCOPHAGE) 1000 MG tablet Take 1,000 mg by mouth 2 (two) times daily with a meal. Patient  not taking: No sig reported    [provider]  phenytoin (DILANTIN) 300 MG ER capsule Take 1 capsule (300 mg total) by mouth 2 (two) times daily. Patient currently on 100 mg 2 times a day Patient not taking: No sig reported 01/16/20   Lillard Anes, MD  pravastatin (PRAVACHOL) 40 MG tablet Take 1 tablet (40 mg total) by mouth daily. Patient not taking: No sig reported 11/18/19   Lillard Anes, MD  TOVIAZ 4 MG TB24 tablet TAKE 1  TABLET EVERY DAY Patient not taking: No sig reported 03/25/20   Lillard Anes, MD  triamcinolone cream (KENALOG) 0.1 % APPLY TO THE AFFECTED AREA(S) TWICE DAILY AS NEEDED Patient not taking: No sig reported 11/16/19   Lillard Anes, MD  warfarin (COUMADIN) 2 MG tablet Take 1 tablet (2 mg total) by mouth 3 (three) times a week. Monday, Wednesday and Friday Patient not taking: No sig reported 03/18/20   Lillard Anes, MD  warfarin (COUMADIN) 4 MG tablet Take 4 mg by mouth daily. Saturday, Sunday,tuesday and thursday Patient not taking: No sig reported    [provider]     Vital Signs: BP (!) 107/51   Pulse (!) 58   Temp 98.5 F (36.9 C) (Axillary)   Resp (!) 23   Ht 6' (1.829 m)   Wt 254 lb 10.1 oz (115.5 kg)   SpO2 100%   BMI 34.53 kg/m   Physical Exam Constitutional:      General: He is not in acute distress. Pulmonary:     Effort: Pulmonary effort is normal.  Abdominal:     Palpations: Abdomen is soft.     Tenderness: There is no abdominal tenderness.     Comments: RUQ drain to gravity. Patient denies pain/tenderness. Per RN the drain flushes easily. Scant output for the past few days.   Neurological:     Mental Status: He is alert.     Comments: Patient able to answer questions appropriately    Imaging: DG CHEST PORT 1 VIEW  Result Date: 01/03/2021 CLINICAL DATA:  Acute respiratory failure EXAM: PORTABLE CHEST 1 VIEW COMPARISON:  01/02/2021 FINDINGS: Cardiac shadow is enlarged. Significant improved aeration in the right lung is noted with persistent basilar airspace opacity. Left lung demonstrates basilar atelectasis. No bony abnormality is seen. IMPRESSION: Significant improved aeration of the right lung as described. Electronically Signed   By: Inez Catalina M.D.   On: 01/03/2021 02:10   DG CHEST PORT 1 VIEW  Result Date: 01/02/2021 CLINICAL DATA:  Acute respiratory failure with hypoxia EXAM: PORTABLE CHEST 1 VIEW COMPARISON:   01/02/2021 FINDINGS: Complete opacification of the right hemithorax. Left lower atelectasis or infiltrate. Heart is normal size. No acute bony abnormality. IMPRESSION: Complete opacification of the right hemithorax. Left base atelectasis or infiltrate. Electronically Signed   By: Rolm Baptise M.D.   On: 01/02/2021 17:30   DG CHEST PORT 1 VIEW  Result Date: 01/02/2021 CLINICAL DATA:  65 year old male with history of hypoxemia. EXAM: PORTABLE CHEST 1 VIEW COMPARISON:  Chest x-ray 01/01/2021. FINDINGS: Near complete opacification of the right hemithorax. Left basilar opacity which may reflect atelectasis and/or consolidation. Small left pleural effusion. No pneumothorax. Heart size is mildly enlarged. Upper mediastinal contours are distorted by patient positioning. IMPRESSION: 1. Dramatic worsening aeration in the right lung concerning for enlarging large right pleural effusion with worsening atelectasis and/or consolidation in the underlying lung. 2. Small amount of atelectasis and/or consolidation in the left lower lobe  with small left pleural effusion. Electronically Signed   By: Vinnie Langton M.D.   On: 01/02/2021 08:31   DG Chest Port 1 View  Result Date: 01/01/2021 CLINICAL DATA:  Hypoxia. EXAM: PORTABLE CHEST 1 VIEW COMPARISON:  A 08/2020 FINDINGS: Right lower lobe consolidation and right effusion unchanged. Mild left lower lobe atelectasis. Negative for heart failure or edema. IMPRESSION: Right lower lobe consolidation and right effusion unchanged. Electronically Signed   By: Franchot Gallo M.D.   On: 01/01/2021 08:05   ECHOCARDIOGRAM COMPLETE  Result Date: 01/01/2021    ECHOCARDIOGRAM REPORT   Patient Name:   Matthew Gutierrez Date of Exam: 01/01/2021 Medical Rec #:  [id number]      Height:       72.0 in Accession #:    [id number]     Weight:       255.7 lb Date of Birth:  20-Feb-1956      BSA:          2.365 m Patient Age:    65 years       BP:           138/64 mmHg Patient Gender: M              HR:            64 bpm. Exam Location:  Inpatient Procedure: 2D Echo, Cardiac Doppler, Color Doppler and Intracardiac            Opacification Agent Indications:    I48.0 Paroxysmal atrial fibrillation  History:        Patient has prior history of Echocardiogram examinations, most                 recent 12/15/2019. COPD and Stroke; Risk Factors:Hypertension,                 Diabetes and Dyslipidemia. CKD.  Sonographer:    Jonelle Sidle Dance Referring Phys: [id number] Margie Billet IMPRESSIONS  1. Left ventricular ejection fraction, by estimation, is 60 to 65%. The left ventricle has normal function. The left ventricle has no regional wall motion abnormalities. Left ventricular diastolic parameters were normal.  2. Right ventricular systolic function is normal. The right ventricular size is normal.  3. The mitral valve is normal in structure. No evidence of mitral valve regurgitation.  4. The aortic valve is normal in structure. Aortic valve regurgitation is not visualized.  5. Aortic dilatation noted. There is borderline dilatation of the ascending aorta, measuring 39 mm. FINDINGS  Left Ventricle: Left ventricular ejection fraction, by estimation, is 60 to 65%. The left ventricle has normal function. The left ventricle has no regional wall motion abnormalities. Definity contrast agent was given IV to delineate the left ventricular  endocardial borders. The left ventricular internal cavity size was normal in size. There is no left ventricular hypertrophy. Left ventricular diastolic parameters were normal. Right Ventricle: The right ventricular size is normal. Right vetricular wall thickness was not well visualized. Right ventricular systolic function is normal. Left Atrium: Left atrial size was normal in size. Right Atrium: Right atrial size was normal in size. Pericardium: There is no evidence of pericardial effusion. Mitral Valve: The mitral valve is normal in structure. No evidence of mitral valve regurgitation. Tricuspid Valve: The  tricuspid valve is normal in structure. Tricuspid valve regurgitation is trivial. Aortic Valve: The aortic valve is normal in structure. Aortic valve regurgitation is not visualized. Pulmonic Valve: The pulmonic valve was normal in structure. Pulmonic valve regurgitation is not visualized.  Aorta: Aortic dilatation noted. There is borderline dilatation of the ascending aorta, measuring 39 mm. IAS/Shunts: The atrial septum is grossly normal.  LEFT VENTRICLE PLAX 2D LVIDd:         5.20 cm  Diastology LVIDs:         3.30 cm  LV e' medial:    5.33 cm/s LV PW:         1.50 cm  LV E/e' medial:  11.4 LV IVS:        1.30 cm  LV e' lateral:   8.27 cm/s LVOT diam:     2.10 cm  LV E/e' lateral: 7.4 LV SV:         46 LV SV Index:   19 LVOT Area:     3.46 cm  RIGHT VENTRICLE             IVC RV S prime:     14.50 cm/s  IVC diam: 1.90 cm TAPSE (M-mode): 2.3 cm LEFT ATRIUM           Index LA diam:      4.70 cm 1.99 cm/m LA Vol (A4C): 49.4 ml 20.89 ml/m  AORTIC VALVE LVOT Vmax:   65.30 cm/s LVOT Vmean:  41.900 cm/s LVOT VTI:    0.133 m  AORTA Ao Root diam: 3.80 cm Ao Asc diam:  3.90 cm MITRAL VALVE MV Area (PHT): 3.58 cm    SHUNTS MV Decel Time: 212 msec    Systemic VTI:  0.13 m MV E velocity: 60.80 cm/s  Systemic Diam: 2.10 cm MV A velocity: 72.00 cm/s MV E/A ratio:  0.84 Mertie Moores MD Electronically signed by Mertie Moores MD Signature Date/Time: 01/01/2021/4:57:26 PM    Final     Labs:  CBC: Recent Labs    01/01/21 0723 01/02/21 0406 01/03/21 0221 01/04/21 0651  WBC 11.5* 10.1 10.5 10.9*  HGB 9.9* 9.8* 10.7* 9.9*  HCT 32.2* 32.4* 34.1* 33.1*  PLT 153 149* 119* 149*    COAGS: Recent Labs    12/31/20 0641 01/01/21 0723 01/02/21 0406 01/04/21 0651  INR 1.4* 1.3* 1.3* 1.2    BMP: Recent Labs    01/16/20 1529 01/31/20 1129 03/12/20 1202 03/26/20 1137 12/28/20 2141 12/31/20 0641 01/01/21 0446 01/01/21 0723 01/02/21 0406 01/03/21 0445  NA 138 136 136 137   < > 138 140 139 139 138  K 3.7 3.9  5.0 5.0   < > 4.1 3.1* 3.1* 3.2* 4.0  CL 90* 90* 93* 94*   < > 99  --  100 99 100  CO2 30* '27 27 23   '$ < > 26  --  '28 30 28  '$ GLUCOSE 143* 167* 130* 184*   < > 238*  --  208* 129* 145*  BUN 42* 34* 31* 59*   < > 35*  --  43* 40* 38*  CALCIUM 7.0* 7.7* 8.8 9.3   < > 8.8*  --  8.7* 8.9 8.7*  CREATININE 2.20* 1.74* 1.70* 1.57*   < > 2.13*  --  2.08* 2.06* 1.88*  GFRNONAA 30* 40* 41* 46*   < > 34*  --  34* 35* 39*  GFRAA 35* 47* 48* 53*  --   --   --   --   --   --    < > = values in this interval not displayed.    LIVER FUNCTION TESTS: Recent Labs    12/31/20 0641 01/01/21 0723 01/02/21 0406 01/03/21 0445  BILITOT 4.7* 3.7* 3.9*  4.1*  AST 67* 98* 89* 60*  ALT 28 38 41 35  ALKPHOS 212* 234* 267* 279*  PROT 6.2* 6.0* 6.3* 6.0*  ALBUMIN 1.5* 1.6* 1.7* 1.7*    Assessment and Plan:  Acute cholecystitis s/p percutaneous cholecystostomy 12/29/20  Patient is afebrile, WBC 10.9. Metabolic panel obtained yesterday shows LFTs and bilirubin levels to be stable. Patient denies RUQ pain/discomfort and he's had scant drain output for the past few days. Per RN the drain flushes easily.   IR will plan to see the patient in approximately 6 weeks for a cholangiogram and possible capping trial. While inpatient, IR recommends the drain to be flushed once per shift and continue to document any output. Change the dressing daily or as needed.   Please call IR with any questions. IR will continue to follow peripherally and will place outpatient orders closer to patient discharge.   Electronically Signed: Soyla Dryer, AGACNP-BC [phone] 01/04/2021, 4:00 PM   I spent a total of 15 Minutes at the the patient's bedside AND on the patient's hospital floor or unit, greater than 50% of which was counseling/coordinating care for percutaneous cholecystostomy drain

## 2021-01-05 ENCOUNTER — Inpatient Hospital Stay (HOSPITAL_COMMUNITY): Payer: No Typology Code available for payment source

## 2021-01-05 DIAGNOSIS — R609 Edema, unspecified: Secondary | ICD-10-CM

## 2021-01-05 LAB — COMPREHENSIVE METABOLIC PANEL
ALT: 27 U/L (ref 0–44)
AST: 45 U/L — ABNORMAL HIGH (ref 15–41)
Albumin: 1.8 g/dL — ABNORMAL LOW (ref 3.5–5.0)
Alkaline Phosphatase: 316 U/L — ABNORMAL HIGH (ref 38–126)
Anion gap: 8 (ref 5–15)
BUN: 27 mg/dL — ABNORMAL HIGH (ref 8–23)
CO2: 28 mmol/L (ref 22–32)
Calcium: 8.6 mg/dL — ABNORMAL LOW (ref 8.9–10.3)
Chloride: 96 mmol/L — ABNORMAL LOW (ref 98–111)
Creatinine, Ser: 1.39 mg/dL — ABNORMAL HIGH (ref 0.61–1.24)
GFR, Estimated: 56 mL/min — ABNORMAL LOW (ref >60)
Glucose, Bld: 159 mg/dL — ABNORMAL HIGH (ref 70–99)
Potassium: 3.7 mmol/L (ref 3.5–5.1)
Sodium: 132 mmol/L — ABNORMAL LOW (ref 135–145)
Total Bilirubin: 2.5 mg/dL — ABNORMAL HIGH (ref 0.3–1.2)
Total Protein: 5.9 g/dL — ABNORMAL LOW (ref 6.5–8.1)

## 2021-01-05 LAB — PROTIME-INR
INR: 1.5 — ABNORMAL HIGH (ref 0.8–1.2)
Prothrombin Time: 18.1 seconds — ABNORMAL HIGH (ref 11.4–15.2)

## 2021-01-05 LAB — GLUCOSE, CAPILLARY
Glucose-Capillary: 130 mg/dL — ABNORMAL HIGH (ref 70–99)
Glucose-Capillary: 110 mg/dL — ABNORMAL HIGH (ref 70–99)
Glucose-Capillary: 146 mg/dL — ABNORMAL HIGH (ref 70–99)
Glucose-Capillary: 172 mg/dL — ABNORMAL HIGH (ref 70–99)
Glucose-Capillary: 165 mg/dL — ABNORMAL HIGH (ref 70–99)

## 2021-01-05 LAB — CULTURE, BLOOD (ROUTINE X 2)
Culture: NO GROWTH
Culture: NO GROWTH
Special Requests: ADEQUATE
Special Requests: ADEQUATE

## 2021-01-05 LAB — HEPARIN LEVEL (UNFRACTIONATED): Heparin Unfractionated: 0.42 IU/mL (ref 0.30–0.70)

## 2021-01-05 LAB — PHENYTOIN LEVEL, TOTAL: Phenytoin Lvl: 2.7 ug/mL — ABNORMAL LOW (ref 10.0–20.0)

## 2021-01-05 MED ORDER — WARFARIN SODIUM 4 MG PO TABS
4.0000 mg | ORAL_TABLET | Freq: Once | ORAL | Status: AC
  Administered 2021-01-05: 4 mg via ORAL
  Filled 2021-01-05: qty 1

## 2021-01-05 NOTE — Progress Notes (Signed)
[place] for Heparin/Coumadin Indication: Afib, history of DVT  Allergies  Allergen Reactions   Codeine Other (See Comments)    Unknown Reaction    Patient Measurements: Height: 6' (182.9 cm) Weight: 117.4 kg (258 lb 13.1 oz) IBW/kg (Calculated) : 77.6 Heparin dosing weight: 102.7 kg  Vital Signs: Temp: 98.5 F (36.9 C) (08/09 0709) Temp Source: Axillary (08/09 0709) BP: 107/54 (08/09 0600) Pulse Rate: 48 (08/09 0600)  Labs: Recent Labs    01/03/21 0221 01/03/21 0445 01/04/21 0651 01/05/21 0142  HGB 10.7*  --  9.9*  --   HCT 34.1*  --  33.1*  --   PLT 119*  --  149*  --   LABPROT  --   --  15.3* 18.1*  INR  --   --  1.2 1.5*  HEPARINUNFRC  --  0.50 0.37 0.42  CREATININE  --  1.88*  --  1.39*     Estimated Creatinine Clearance: 69.1 mL/min (A) (by C-G formula based on SCr of 1.39 mg/dL (H)).   Assessment: 65 y/o M from SNF with hypoxemia. Unable to follow commands in the ED. Having some abdominal pain. On warfarin PTA for history of DVT and Afib.  Of note, RN reports that patient was not on anticoagulation on arrival to SNF and he has a fall risk; therefore, anticoagulation was not resumed.   Underwent cholecystostomy tube placement 8/2 and ERCP on 8/3 with choledocholithiasis found, sphincterotomy performed. Minor bleeding at sphincterotomy site treated with epinephrine.  IV heparin resumed post procedure and Coumadin added 01/03/21.  Heparin level remains therapeutic at 0.42 units/mL.  INR sub-therapeutic but increased to 1.5.  No bleeding reported.  PO intake fair. Dilantin may impact INR. Off amiodarone on 8/5.    Goal of Therapy:  Heparin level 0.3-0.7 units/mL Monitor platelets by anticoagulation protocol: Yes   Plan:  Continue heparin infusion at 2100 units/hr Coumadin '4mg'$  PO today Daily heparin level, PT / INR and CBC  Alanda Slim, PharmD, [facility] Clinical Pharmacist Please see AMION for all Pharmacists' Contact  Phone Numbers 01/05/2021, 7:23 AM

## 2021-01-05 NOTE — Progress Notes (Signed)
Left lower extremity venous duplex completed. Refer to "CV Proc" under chart review to view preliminary results.  01/05/2021 1:33 PM Kelby Aline., MHA, RVT, RDCS, RDMS

## 2021-01-05 NOTE — Progress Notes (Signed)
Patient is eating. CPT not done at this time.

## 2021-01-05 NOTE — Progress Notes (Signed)
Occupational Therapy Treatment Patient Details Name: Matthew Gutierrez MRN: [id number] DOB: 07-15-1955 Today's Date: 01/05/2021    History of present illness Matthew Gutierrez is a 65 y.o. male with medical history significant for DMT2, CKD 3, a-fib, COPD, neuropathy due to diabetes, history of stroke, OSA, PVD who presents by EMS from skilled nursing facility for evaluation of altered mental status with hypoxia.  Reportedly patient is normally conversational at his baseline. Patient is found to be febrile with a temperature of 101.4 degrees.  He is also found to be septic with elevated lactic acid, encephalopathy, tachycardia, tachypnea and imaging revealed acute cholecystitis.  He has elevated bilirubin level.  Surgery was consulted and recommended MRCP in light of his clinical situation.   OT comments  Session with focus on BUE AROM/PROM HEP and self-feeding with use of AE including built-up foam on spoon. Patient able to scoop fruit from cup and bring to mouth with Min A to maintain position of cup in L hand. Noted swelling throughout LUE. Elevated extremity above the level of the heart at conclusion of session. Will focus next session on retrograde massage. OT will continue to follow acutely.    Follow Up Recommendations  SNF    Equipment Recommendations  None recommended by OT    Recommendations for Other Services      Precautions / Restrictions Precautions Precautions: Fall Precaution Comments: HOH Restrictions Other Position/Activity Restrictions: patient has B foot/heel wounds, pressure relief boots donned.       Mobility Bed Mobility Overal bed mobility: Needs Assistance                  Transfers                 General transfer comment: Deferred; patient dependent on mechanical lift at baseline.    Balance                                           ADL either performed or assessed with clinical judgement   ADL Overall ADL's : Needs  assistance/impaired Eating/Feeding: Minimal assistance Eating/Feeding Details (indicate cue type and reason): Min A at bedlevel with built-up foam on spoon. Assist to maintain position of fruit cup in L hand.                                         Vision       Perception     Praxis      Cognition Arousal/Alertness: Awake/alert Behavior During Therapy: Flat affect Overall Cognitive Status: No family/caregiver present to determine baseline cognitive functioning                                 General Comments: Patient awake/alert at time of OT session. Follows 1-step verbal commands with good accuracy. Able to answer this therapists questions. Able to make needs known.        Exercises Exercises: General Upper Extremity General Exercises - Upper Extremity Elbow Flexion: PROM;10 reps;Left Elbow Extension: PROM;10 reps;Left Wrist Flexion: PROM;Left;10 reps Wrist Extension: AROM;Left;10 reps General Exercises - Lower Extremity Heel Slides:  (assisted flexion, resisted extension)   Shoulder Instructions       General Comments      Pertinent  Vitals/ Pain       Pain Assessment: Faces Faces Pain Scale: Hurts little more Pain Location: L arm Pain Descriptors / Indicators: Sore Pain Intervention(s): Limited activity within patient's tolerance;Monitored during session  Home Living                                          Prior Functioning/Environment              Frequency  Min 1X/week        Progress Toward Goals  OT Goals(current goals can now be found in the care plan section)  Progress towards OT goals: Progressing toward goals  Acute Rehab OT Goals Patient Stated Goal: States he wants to be abel to stand with walker OT Goal Formulation: With patient Time For Goal Achievement: 01/15/21 Potential to Achieve Goals: Fair ADL Goals Pt Will Perform Eating: with set-up;sitting;bed level;with adaptive  utensils Pt Will Perform Grooming: with min assist;sitting;bed level Additional ADL Goal #1: Patient will tolerate 1 set x 10 reps each of BUE HEP to improve strength in prep for ADLs.  Plan Discharge plan remains appropriate;Frequency remains appropriate    Co-evaluation                 AM-PAC OT "6 Clicks" Daily Activity     Outcome Measure   Help from another person eating meals?: A Little Help from another person taking care of personal grooming?: A Lot Help from another person toileting, which includes using toliet, bedpan, or urinal?: Total Help from another person bathing (including washing, rinsing, drying)?: Total Help from another person to put on and taking off regular upper body clothing?: Total Help from another person to put on and taking off regular lower body clothing?: Total 6 Click Score: 9    End of Session Equipment Utilized During Treatment: Oxygen  OT Visit Diagnosis: Other abnormalities of gait and mobility (R26.89);Muscle weakness (generalized) (M62.81);Feeding difficulties (R63.3)   Activity Tolerance Patient tolerated treatment well   Patient Left in bed;with call bell/phone within reach;with bed alarm set   Nurse Communication Need for lift equipment        Time: [id number] OT Time Calculation (min): 25 min  Charges: OT General Charges $OT Visit: 1 Visit OT Treatments $Self Care/Home Management : 8-22 mins $Therapeutic Exercise: 8-22 mins  Driana Dazey H. OTR/L Supplemental OT, Department of rehab services [phone]   Jaydalyn Demattia R H. 01/05/2021, 2:32 PM

## 2021-01-05 NOTE — Progress Notes (Signed)
CPT not done at this time. Patient is sleeping.

## 2021-01-05 NOTE — Progress Notes (Signed)
RT note. RT attempt to place patient on bipap, Patient refuse bipap tonight. Patient stated "please do not ask me to wear that machine tonight". Patient currently on 2L [place] sat 100% no labored breathing noted. V60 in patients room if anything changes. RT will continue to monitor.

## 2021-01-05 NOTE — Progress Notes (Signed)
Progress Note    Matthew Gutierrez  [id number] DOB: 08-12-1955  DOA: 12/28/2020 PCP: Lillard Anes, MD      Brief Narrative:    Medical records reviewed and are as summarized below:  Matthew Gutierrez is a 65 y.o. male  with medical history significant for DMT2, CKD 3, a-fib, COPD, neuropathy due to diabetes, history of stroke, OSA, PVD, who was brought to the hospital from nursing home because of altered mental status and hypoxia with oxygen saturation in the 80s.  He was admitted to the hospital for acute ascending cholangitis.  He was treated with empiric antibiotics.  Cholecystostomy tube was placed by IR on 12/29/2020.  He underwent ERCP on 12/30/2020.  Following the procedure, he developed atrial fibrillation with RVR and right lung atelectasis with mediastinal shift felt to be due to aspiration pneumonia and mucous plugging.    Assessment/Plan:   Principal Problem:   Acute cholecystitis Active Problems:   Essential hypertension   H/O: stroke   Acute renal failure superimposed on stage 3 chronic kidney disease (HCC)   COPD (chronic obstructive pulmonary disease) ([place])   Long term (current) use of anticoagulants   Obstructive sleep apnea   Paroxysmal atrial fibrillation (HCC)   Sepsis (HCC)   Seizure disorder (HCC)   Encephalopathy   Abnormal magnetic resonance cholangiopancreatography (MRCP)   Calculus of bile duct with acute cholangitis with obstruction   Elevated LFTs   Acute respiratory distress    Body mass index is 35.1 kg/m.  (Obesity)   Sepsis secondary to ascending cholangitis and cholecystitis due to Klebsiella pneumoniae, aspiration pneumonia: S/p cholecystostomy tube placed on 12/29/2020.  S/p ERCP with biliary sphincterotomy on 12/30/2020.  Completed 7 days of antibiotics on 01/04/2021. IR recommends outpatient follow-up in 6 weeks for cholangiogram.  Acute on chronic hypoxic and hypercapnic respiratory failure: Continue 2 L/min oxygen via nasal  cannula.  Acute toxic metabolic encephalopathy: Improved.  COPD, OSA: Continue bronchodilators  Atrial fibrillation with RVR: Continue IV heparin infusion and monitor heparin level per protocol.  Continue warfarin and monitor INR INR is therapeutic.  CHA2DS2-VASc is 7.  Seizure disorder: Continue Dilantin and Depakote.  Corrected Dilantin level is okay.    CKD stage IIIb: Creatinine stable  Unstageable bilateral heel, left and right ankle decubitus ulcers (all present on admission): Continue local wound care.  Left upper extremity swelling: Obtain venous duplex of the left upper extremity for further evaluation.  Plan to discharge to SNF when INR is therapeutic.   Diet Order             DIET DYS 3 Room service appropriate? No; Fluid consistency: Thin  Diet effective now                      Consultants: Cardiologist Intensivist General surgeon Gastroenterologist  Procedures: Cholecystostomy tube placement on 12/29/2020 ERCP with biliary sphincterectomy on 12/30/2020.     Medications:    Chlorhexidine Gluconate Cloth  6 each Topical Daily   collagenase   Topical Daily   divalproex  125 mg Oral Q12H   And   divalproex  500 mg Oral Q12H   gabapentin  300 mg Oral QHS   guaiFENesin  5 mL Oral BID   insulin aspart  0-9 Units Subcutaneous Q4H   phenytoin (DILANTIN) IV  100 mg Intravenous Q12H   sodium chloride flush  5 mL Intracatheter Q12H   warfarin  4 mg Oral ONCE-1600   Warfarin - Pharmacist Dosing  Inpatient   Does not apply q1600   Continuous Infusions:  heparin 2,100 Units/hr (01/05/21 1209)     Anti-infectives (From admission, onward)    Start     Dose/Rate Route Frequency Ordered Stop   12/31/20 1245  cefTRIAXone (ROCEPHIN) 2 g in sodium chloride 0.9 % 100 mL IVPB  Status:  Discontinued        2 g 200 mL/hr over 30 Minutes Intravenous Every 24 hours 12/31/20 1152 01/04/21 1526   12/30/20 1300  metroNIDAZOLE (FLAGYL) IVPB 500 mg  Status:   Discontinued        500 mg 100 mL/hr over 60 Minutes Intravenous Every 12 hours 12/30/20 1203 12/31/20 1340   12/29/20 2200  vancomycin (VANCOREADY) IVPB 1250 mg/250 mL  Status:  Discontinued        1,250 mg 166.7 mL/hr over 90 Minutes Intravenous Every 24 hours 12/29/20 0006 12/29/20 1831   12/29/20 2100  cefTRIAXone (ROCEPHIN) 2 g in sodium chloride 0.9 % 100 mL IVPB  Status:  Discontinued        2 g 200 mL/hr over 30 Minutes Intravenous Every 24 hours 12/29/20 1831 12/31/20 1152   12/29/20 1000  ceFEPIme (MAXIPIME) 2 g in sodium chloride 0.9 % 100 mL IVPB  Status:  Discontinued        2 g 200 mL/hr over 30 Minutes Intravenous Every 12 hours 12/29/20 0200 12/29/20 1831   12/29/20 0600  piperacillin-tazobactam (ZOSYN) IVPB 3.375 g  Status:  Discontinued       See Hyperspace for full Linked Orders Report.   3.375 g 12.5 mL/hr over 240 Minutes Intravenous Every 8 hours 12/28/20 2244 12/29/20 0200   12/29/20 0200  metroNIDAZOLE (FLAGYL) IVPB 500 mg  Status:  Discontinued        500 mg 100 mL/hr over 60 Minutes Intravenous Every 8 hours 12/29/20 0154 12/29/20 1831   12/29/20 0000  vancomycin (VANCOREADY) IVPB 1250 mg/250 mL  Status:  Discontinued        1,250 mg 166.7 mL/hr over 90 Minutes Intravenous Every 24 hours 12/28/20 2246 12/29/20 0006   12/28/20 2245  vancomycin (VANCOCIN) 2,500 mg in sodium chloride 0.9 % 500 mL IVPB        2,500 mg 250 mL/hr over 120 Minutes Intravenous  Once 12/28/20 2244 12/29/20 0205   12/28/20 2245  piperacillin-tazobactam (ZOSYN) IVPB 3.375 g       See Hyperspace for full Linked Orders Report.   3.375 g 100 mL/hr over 30 Minutes Intravenous  Once 12/28/20 2244 12/28/20 2319              Family Communication/Anticipated D/C date and plan/Code Status   DVT prophylaxis:  warfarin (COUMADIN) tablet 4 mg     Code Status: Full Code  Family Communication: None Disposition Plan:    Status is: Inpatient  Remains inpatient appropriate  because:IV treatments appropriate due to intensity of illness or inability to take PO and Inpatient level of care appropriate due to severity of illness  Dispo: The patient is from: Home              Anticipated d/c is to: Home              Patient currently is not medically stable to d/c.   Difficult to place patient No           Subjective:   Interval events noted.  No pain in the upper or lower extremities.  Objective:    Vitals:  01/05/21 0900 01/05/21 1000 01/05/21 1056 01/05/21 1100  BP: 139/64 (!) 125/57  126/62  Pulse: 63 (!) 50  (!) 50  Resp: (!) 24 (!) 24  20  Temp:   98.1 F (36.7 C)   TempSrc:   Axillary   SpO2: 95% 98%  99%  Weight:      Height:       No data found.   Intake/Output Summary (Last 24 hours) at 01/05/2021 1233 Last data filed at 01/05/2021 1000 Gross per 24 hour  Intake 1320.79 ml  Output 1050 ml  Net 270.79 ml   Filed Weights   01/01/21 1000 01/03/21 0500 01/05/21 0500  Weight: 116 kg 115.5 kg 117.4 kg    Exam:  GEN: NAD SKIN: Unstageable left heel decubitus ulcer, unstageable left lateral ankle decubitus ulcer, unstageable left medial ankle decubitus ulcer, unstageable right lateral ankle decubitus ulcer, unstageable right heel decubitus ulcer EYES: No pallor or icterus ENT: MMM CV: RRR PULM: CTA B ABD: soft, obese, NT, +BS CNS: AAO x 3, non focal EXT: Swelling of the left upper extremity.  No erythema or tenderness.       Pressure Injury 12/29/20 Heel Left;Lateral Unstageable - Full thickness tissue loss in which the base of the injury is covered by slough (yellow, tan, gray, green or brown) and/or eschar (tan, brown or black) in the wound bed. (Active)  12/29/20 2300  Location: Heel  Location Orientation: Left;Lateral  Staging: Unstageable - Full thickness tissue loss in which the base of the injury is covered by slough (yellow, tan, gray, green or brown) and/or eschar (tan, brown or black) in the wound bed.  Wound  Description (Comments):   Present on Admission: Yes     Pressure Injury 12/29/20 Ankle Left;Lateral Unstageable - Full thickness tissue loss in which the base of the injury is covered by slough (yellow, tan, gray, green or brown) and/or eschar (tan, brown or black) in the wound bed. (Active)  12/29/20 2300  Location: Ankle  Location Orientation: Left;Lateral  Staging: Unstageable - Full thickness tissue loss in which the base of the injury is covered by slough (yellow, tan, gray, green or brown) and/or eschar (tan, brown or black) in the wound bed.  Wound Description (Comments):   Present on Admission: Yes     Pressure Injury 12/29/20 Ankle Left;Medial;Lateral Unstageable - Full thickness tissue loss in which the base of the injury is covered by slough (yellow, tan, gray, green or brown) and/or eschar (tan, brown or black) in the wound bed. (Active)  12/29/20 2300  Location: Ankle  Location Orientation: Left;Medial;Lateral  Staging: Unstageable - Full thickness tissue loss in which the base of the injury is covered by slough (yellow, tan, gray, green or brown) and/or eschar (tan, brown or black) in the wound bed.  Wound Description (Comments):   Present on Admission: Yes     Pressure Injury 12/29/20 Ankle Right;Lateral Unstageable - Full thickness tissue loss in which the base of the injury is covered by slough (yellow, tan, gray, green or brown) and/or eschar (tan, brown or black) in the wound bed. (Active)  12/29/20 2300  Location: Ankle  Location Orientation: Right;Lateral  Staging: Unstageable - Full thickness tissue loss in which the base of the injury is covered by slough (yellow, tan, gray, green or brown) and/or eschar (tan, brown or black) in the wound bed.  Wound Description (Comments):   Present on Admission: Yes     Pressure Injury 12/29/20 Heel Right;Lateral Unstageable - Full thickness  tissue loss in which the base of the injury is covered by slough (yellow, tan, gray, green  or brown) and/or eschar (tan, brown or black) in the wound bed. (Active)  12/29/20 2300  Location: Heel  Location Orientation: Right;Lateral  Staging: Unstageable - Full thickness tissue loss in which the base of the injury is covered by slough (yellow, tan, gray, green or brown) and/or eschar (tan, brown or black) in the wound bed.  Wound Description (Comments):   Present on Admission: Yes     Pressure Injury Foot Right;Lateral Unstageable - Full thickness tissue loss in which the base of the injury is covered by slough (yellow, tan, gray, green or brown) and/or eschar (tan, brown or black) in the wound bed. (Active)     Location: Foot  Location Orientation: Right;Lateral  Staging: Unstageable - Full thickness tissue loss in which the base of the injury is covered by slough (yellow, tan, gray, green or brown) and/or eschar (tan, brown or black) in the wound bed.  Wound Description (Comments):   Present on Admission: Yes     Data Reviewed:   I have personally reviewed following labs and imaging studies:  Labs: Labs show the following:   Basic Metabolic Panel: Recent Labs  Lab 12/31/20 0641 01/01/21 0446 01/01/21 0723 01/02/21 0406 01/03/21 0445 01/04/21 0651 01/05/21 0142  NA 138 140 139 139 138  --  132*  K 4.1 3.1* 3.1* 3.2* 4.0  --  3.7  CL 99  --  100 99 100  --  96*  CO2 26  --  '28 30 28  '$ --  28  GLUCOSE 238*  --  208* 129* 145*  --  159*  BUN 35*  --  43* 40* 38*  --  27*  CREATININE 2.13*  --  2.08* 2.06* 1.88*  --  1.39*  CALCIUM 8.8*  --  8.7* 8.9 8.7*  --  8.6*  MG 1.8  --  2.8* 2.3 2.0 1.9  --   PHOS  --   --  3.2  --  3.1  --   --    GFR Estimated Creatinine Clearance: 69.1 mL/min (A) (by C-G formula based on SCr of 1.39 mg/dL (H)). Liver Function Tests: Recent Labs  Lab 12/31/20 0641 01/01/21 0723 01/02/21 0406 01/03/21 0445 01/05/21 0142  AST 67* 98* 89* 60* 45*  ALT 28 38 41 35 27  ALKPHOS 212* 234* 267* 279* 316*  BILITOT 4.7* 3.7* 3.9* 4.1*  2.5*  PROT 6.2* 6.0* 6.3* 6.0* 5.9*  ALBUMIN 1.5* 1.6* 1.7* 1.7* 1.8*   No results for input(s): LIPASE, AMYLASE in the last 168 hours. No results for input(s): AMMONIA in the last 168 hours.  Coagulation profile Recent Labs  Lab 12/31/20 0641 01/01/21 0723 01/02/21 0406 01/04/21 0651 01/05/21 0142  INR 1.4* 1.3* 1.3* 1.2 1.5*    CBC: Recent Labs  Lab 12/30/20 0032 12/31/20 0641 01/01/21 0446 01/01/21 0723 01/02/21 0406 01/03/21 0221 01/04/21 0651  WBC 9.3 13.1*  --  11.5* 10.1 10.5 10.9*  NEUTROABS 7.4  --   --  8.9*  --   --   --   HGB 11.3* 9.9* 10.5* 9.9* 9.8* 10.7* 9.9*  HCT 36.2* 33.9* 31.0* 32.2* 32.4* 34.1* 33.1*  MCV 87.2 90.9  --  88.0 88.3 88.3 88.5  PLT 142* 142*  --  153 149* 119* 149*   Cardiac Enzymes: No results for input(s): CKTOTAL, CKMB, CKMBINDEX, TROPONINI in the last 168 hours. BNP (last 3 results)  No results for input(s): PROBNP in the last 8760 hours. CBG: Recent Labs  Lab 01/04/21 1921 01/04/21 2308 01/05/21 0338 01/05/21 0707 01/05/21 1054  GLUCAP 141* 148* 130* 110* 146*   D-Dimer: No results for input(s): DDIMER in the last 72 hours. Hgb A1c: No results for input(s): HGBA1C in the last 72 hours. Lipid Profile: No results for input(s): CHOL, HDL, LDLCALC, TRIG, CHOLHDL, LDLDIRECT in the last 72 hours. Thyroid function studies: No results for input(s): TSH, T4TOTAL, T3FREE, THYROIDAB in the last 72 hours.  Invalid input(s): FREET3 Anemia work up: No results for input(s): VITAMINB12, FOLATE, FERRITIN, TIBC, IRON, RETICCTPCT in the last 72 hours. Sepsis Labs: Recent Labs  Lab 01/01/21 0723 01/02/21 0406 01/03/21 0221 01/04/21 0651  WBC 11.5* 10.1 10.5 10.9*    Microbiology Recent Results (from the past 240 hour(s))  Resp Panel by RT-PCR (Flu A&B, Covid) Nasopharyngeal Swab     Status: None   Collection Time: 12/28/20  9:42 PM   Specimen: Nasopharyngeal Swab; Nasopharyngeal(NP) swabs in vial transport medium  Result  Value Ref Range Status   SARS Coronavirus 2 by RT PCR NEGATIVE NEGATIVE Final    Comment: (NOTE) SARS-CoV-2 target nucleic acids are NOT DETECTED.  The SARS-CoV-2 RNA is generally detectable in upper respiratory specimens during the acute phase of infection. The lowest concentration of SARS-CoV-2 viral copies this assay can detect is 138 copies/mL. A negative result does not preclude SARS-Cov-2 infection and should not be used as the sole basis for treatment or other patient management decisions. A negative result may occur with  improper specimen collection/handling, submission of specimen other than nasopharyngeal swab, presence of viral mutation(s) within the areas targeted by this assay, and inadequate number of viral copies(<138 copies/mL). A negative result must be combined with clinical observations, patient history, and epidemiological information. The expected result is Negative.  Fact Sheet for Patients:  EntrepreneurPulse.com.au  Fact Sheet for Healthcare Providers:  IncredibleEmployment.be  This test is no t yet approved or cleared by the [place] FDA and  has been authorized for detection and/or diagnosis of SARS-CoV-2 by FDA under an Emergency Use Authorization (EUA). This EUA will remain  in effect (meaning this test can be used) for the duration of the COVID-19 declaration under Section 564(b)(1) of the Act, 21 U.S.C.section 360bbb-3(b)(1), unless the authorization is terminated  or revoked sooner.       Influenza A by PCR NEGATIVE NEGATIVE Final   Influenza B by PCR NEGATIVE NEGATIVE Final    Comment: (NOTE) The Xpert Xpress SARS-CoV-2/FLU/RSV plus assay is intended as an aid in the diagnosis of influenza from Nasopharyngeal swab specimens and should not be used as a sole basis for treatment. Nasal washings and aspirates are unacceptable for Xpert Xpress SARS-CoV-2/FLU/RSV testing.  Fact Sheet for  Patients: EntrepreneurPulse.com.au  Fact Sheet for Healthcare Providers: IncredibleEmployment.be  This test is not yet approved or cleared by the [place] FDA and has been authorized for detection and/or diagnosis of SARS-CoV-2 by FDA under an Emergency Use Authorization (EUA). This EUA will remain in effect (meaning this test can be used) for the duration of the COVID-19 declaration under Section 564(b)(1) of the Act, 21 U.S.C. section 360bbb-3(b)(1), unless the authorization is terminated or revoked.  Performed at [facility], [place] [address] [place], [place] [address]   Culture, blood (Routine x 2)     Status: Abnormal   Collection Time: 12/28/20  9:45 PM   Specimen: BLOOD  Result Value Ref Range Status  Specimen Description BLOOD RIGHT ANTECUBITAL  Final   Special Requests   Final    BOTTLES DRAWN AEROBIC AND ANAEROBIC Blood Culture adequate volume   Culture  Setup Time   Final    GRAM NEGATIVE RODS AEROBIC BOTTLE ONLY CRITICAL RESULT CALLED TO, READ BACK BY AND VERIFIED WITH: RN C.COBB ON [id number] AT [id number] BY [place] Performed at [facility], [place] [address], [place], [place] [address]    Culture KLEBSIELLA PNEUMONIAE (A)  Final   Report Status 12/31/2020 FINAL  Final   Organism ID, Bacteria KLEBSIELLA PNEUMONIAE  Final      Susceptibility   Klebsiella pneumoniae - MIC*    AMPICILLIN >=32 RESISTANT Resistant     CEFAZOLIN <=4 SENSITIVE Sensitive     CEFEPIME <=0.12 SENSITIVE Sensitive     CEFTAZIDIME <=1 SENSITIVE Sensitive     CEFTRIAXONE <=0.25 SENSITIVE Sensitive     CIPROFLOXACIN <=0.25 SENSITIVE Sensitive     GENTAMICIN <=1 SENSITIVE Sensitive     IMIPENEM <=0.25 SENSITIVE Sensitive     TRIMETH/SULFA <=20 SENSITIVE Sensitive     AMPICILLIN/SULBACTAM 8 SENSITIVE Sensitive     PIP/TAZO <=4 SENSITIVE Sensitive     * KLEBSIELLA PNEUMONIAE  Blood Culture ID Panel (Reflexed)     Status: Abnormal   Collection  Time: 12/28/20  9:45 PM  Result Value Ref Range Status   Enterococcus faecalis NOT DETECTED NOT DETECTED Final   Enterococcus Faecium NOT DETECTED NOT DETECTED Final   Listeria monocytogenes NOT DETECTED NOT DETECTED Final   Staphylococcus species NOT DETECTED NOT DETECTED Final   Staphylococcus aureus (BCID) NOT DETECTED NOT DETECTED Final   Staphylococcus epidermidis NOT DETECTED NOT DETECTED Final   Staphylococcus lugdunensis NOT DETECTED NOT DETECTED Final   Streptococcus species NOT DETECTED NOT DETECTED Final   Streptococcus agalactiae NOT DETECTED NOT DETECTED Final   Streptococcus pneumoniae NOT DETECTED NOT DETECTED Final   Streptococcus pyogenes NOT DETECTED NOT DETECTED Final   A.calcoaceticus-baumannii NOT DETECTED NOT DETECTED Final   Bacteroides fragilis NOT DETECTED NOT DETECTED Final   Enterobacterales DETECTED (A) NOT DETECTED Final    Comment: Enterobacterales represent a large order of gram negative bacteria, not a single organism. CRITICAL RESULT CALLED TO, READ BACK BY AND VERIFIED WITH: RN C.COBB ON [id number] AT 1322 BY E.PARRISH    Enterobacter cloacae complex NOT DETECTED NOT DETECTED Final   Escherichia coli NOT DETECTED NOT DETECTED Final   Klebsiella aerogenes NOT DETECTED NOT DETECTED Final   Klebsiella oxytoca NOT DETECTED NOT DETECTED Final   Klebsiella pneumoniae DETECTED (A) NOT DETECTED Final    Comment: CRITICAL RESULT CALLED TO, READ BACK BY AND VERIFIED WITH: RN C.COBB ON [id number] AT 1322 BY E.PARRISH    Proteus species NOT DETECTED NOT DETECTED Final   Salmonella species NOT DETECTED NOT DETECTED Final   Serratia marcescens NOT DETECTED NOT DETECTED Final   Haemophilus influenzae NOT DETECTED NOT DETECTED Final   Neisseria meningitidis NOT DETECTED NOT DETECTED Final   Pseudomonas aeruginosa NOT DETECTED NOT DETECTED Final   Stenotrophomonas maltophilia NOT DETECTED NOT DETECTED Final   Candida albicans NOT DETECTED NOT DETECTED Final   Candida  auris NOT DETECTED NOT DETECTED Final   Candida glabrata NOT DETECTED NOT DETECTED Final   Candida krusei NOT DETECTED NOT DETECTED Final   Candida parapsilosis NOT DETECTED NOT DETECTED Final   Candida tropicalis NOT DETECTED NOT DETECTED Final   Cryptococcus neoformans/gattii NOT DETECTED NOT DETECTED Final   CTX-M ESBL NOT DETECTED NOT DETECTED Final  Carbapenem resistance IMP NOT DETECTED NOT DETECTED Final   Carbapenem resistance KPC NOT DETECTED NOT DETECTED Final   Carbapenem resistance NDM NOT DETECTED NOT DETECTED Final   Carbapenem resist OXA 48 LIKE NOT DETECTED NOT DETECTED Final   Carbapenem resistance VIM NOT DETECTED NOT DETECTED Final    Comment: Performed at [facility], [place] [address], [place], [place] [address]  Culture, blood (Routine x 2)     Status: Abnormal   Collection Time: 12/28/20 10:25 PM   Specimen: BLOOD  Result Value Ref Range Status   Specimen Description BLOOD LEFT HAND  Final   Special Requests   Final    BOTTLES DRAWN AEROBIC AND ANAEROBIC Blood Culture adequate volume   Culture  Setup Time   Final    GRAM NEGATIVE RODS AEROBIC BOTTLE ONLY GRAM POSITIVE COCCI IN CLUSTERS ANAEROBIC BOTTLE ONLY CRITICAL RESULT CALLED TO, READ BACK BY AND VERIFIED WITH: C PIERCE,PHARMD '@2209'$  12/29/20 MKELLY    Culture (A)  Final    KLEBSIELLA PNEUMONIAE SUSCEPTIBILITIES PERFORMED ON PREVIOUS CULTURE WITHIN THE LAST 5 DAYS. STAPHYLOCOCCUS COHNII THE SIGNIFICANCE OF ISOLATING THIS ORGANISM FROM A SINGLE SET OF BLOOD CULTURES WHEN MULTIPLE SETS ARE DRAWN IS UNCERTAIN. PLEASE NOTIFY THE MICROBIOLOGY DEPARTMENT WITHIN ONE WEEK IF SPECIATION AND SENSITIVITIES ARE REQUIRED. Performed at [facility], [place] [address] [place], [place] [address]    Report Status 01/01/2021 FINAL  Final  Blood Culture ID Panel (Reflexed)     Status: Abnormal   Collection Time: 12/28/20 10:25 PM  Result Value Ref Range Status   Enterococcus faecalis NOT DETECTED NOT DETECTED  Final   Enterococcus Faecium NOT DETECTED NOT DETECTED Final   Listeria monocytogenes NOT DETECTED NOT DETECTED Final   Staphylococcus species DETECTED (A) NOT DETECTED Final    Comment: CRITICAL RESULT CALLED TO, READ BACK BY AND VERIFIED WITH: C PIERCE,PHARMD'@2209'$  12/29/20 MKELLY    Staphylococcus aureus (BCID) NOT DETECTED NOT DETECTED Final   Staphylococcus epidermidis NOT DETECTED NOT DETECTED Final   Staphylococcus lugdunensis NOT DETECTED NOT DETECTED Final   Streptococcus species NOT DETECTED NOT DETECTED Final   Streptococcus agalactiae NOT DETECTED NOT DETECTED Final   Streptococcus pneumoniae NOT DETECTED NOT DETECTED Final   Streptococcus pyogenes NOT DETECTED NOT DETECTED Final   A.calcoaceticus-baumannii NOT DETECTED NOT DETECTED Final   Bacteroides fragilis NOT DETECTED NOT DETECTED Final   Enterobacterales NOT DETECTED NOT DETECTED Final   Enterobacter cloacae complex NOT DETECTED NOT DETECTED Final   Escherichia coli NOT DETECTED NOT DETECTED Final   Klebsiella aerogenes NOT DETECTED NOT DETECTED Final   Klebsiella oxytoca NOT DETECTED NOT DETECTED Final   Klebsiella pneumoniae NOT DETECTED NOT DETECTED Final   Proteus species NOT DETECTED NOT DETECTED Final   Salmonella species NOT DETECTED NOT DETECTED Final   Serratia marcescens NOT DETECTED NOT DETECTED Final   Haemophilus influenzae NOT DETECTED NOT DETECTED Final   Neisseria meningitidis NOT DETECTED NOT DETECTED Final   Pseudomonas aeruginosa NOT DETECTED NOT DETECTED Final   Stenotrophomonas maltophilia NOT DETECTED NOT DETECTED Final   Candida albicans NOT DETECTED NOT DETECTED Final   Candida auris NOT DETECTED NOT DETECTED Final   Candida glabrata NOT DETECTED NOT DETECTED Final   Candida krusei NOT DETECTED NOT DETECTED Final   Candida parapsilosis NOT DETECTED NOT DETECTED Final   Candida tropicalis NOT DETECTED NOT DETECTED Final   Cryptococcus neoformans/gattii NOT DETECTED NOT DETECTED Final     Comment: Performed at [facility], [address] [place], [place] [address]  Aerobic/Anaerobic Culture w Gram Stain (surgical/deep wound)     Status: None   Collection Time: 12/29/20 11:55 AM   Specimen: Gallbladder; Bile  Result Value Ref Range Status   Specimen Description GALL BLADDER  Final   Special Requests NONE  Final   Gram Stain   Final    FEW WBC PRESENT,BOTH PMN AND MONONUCLEAR FEW GRAM NEGATIVE RODS    Culture   Final    ABUNDANT KLEBSIELLA PNEUMONIAE NO ANAEROBES ISOLATED Performed at [facility], [address], [place], [place] [address]    Report Status 01/03/2021 FINAL  Final   Organism ID, Bacteria KLEBSIELLA PNEUMONIAE  Final      Susceptibility   Klebsiella pneumoniae - MIC*    AMPICILLIN >=32 RESISTANT Resistant     CEFAZOLIN <=4 SENSITIVE Sensitive     CEFEPIME <=0.12 SENSITIVE Sensitive     CEFTAZIDIME <=1 SENSITIVE Sensitive     CEFTRIAXONE <=0.25 SENSITIVE Sensitive     CIPROFLOXACIN <=0.25 SENSITIVE Sensitive     GENTAMICIN <=1 SENSITIVE Sensitive     IMIPENEM <=0.25 SENSITIVE Sensitive     TRIMETH/SULFA <=20 SENSITIVE Sensitive     AMPICILLIN/SULBACTAM 8 SENSITIVE Sensitive     PIP/TAZO <=4 SENSITIVE Sensitive     * ABUNDANT KLEBSIELLA PNEUMONIAE  MRSA Next Gen by PCR, Nasal     Status: None   Collection Time: 12/30/20 11:08 AM   Specimen: Nasal Mucosa; Nasal Swab  Result Value Ref Range Status   MRSA by PCR Next Gen NOT DETECTED NOT DETECTED Final    Comment: (NOTE) The GeneXpert MRSA Assay (FDA approved for NASAL specimens only), is one component of a comprehensive MRSA colonization surveillance program. It is not intended to diagnose MRSA infection nor to guide or monitor treatment for MRSA infections. Test performance is not FDA approved in patients less than 46 years old. Performed at [facility], [place] [address], [place], [place] [address]   Culture, blood (Routine X 2) w Reflex to ID Panel     Status: None    Collection Time: 12/31/20  6:41 AM   Specimen: BLOOD  Result Value Ref Range Status   Specimen Description BLOOD LEFT ANTECUBITAL  Final   Special Requests   Final    BOTTLES DRAWN AEROBIC AND ANAEROBIC Blood Culture adequate volume   Culture   Final    NO GROWTH 5 DAYS Performed at [facility], [place] [address], [place], [place] [address]    Report Status 01/05/2021 FINAL  Final  Culture, blood (Routine X 2) w Reflex to ID Panel     Status: None   Collection Time: 12/31/20  6:59 AM   Specimen: BLOOD LEFT FOREARM  Result Value Ref Range Status   Specimen Description BLOOD LEFT FOREARM  Final   Special Requests   Final    BOTTLES DRAWN AEROBIC AND ANAEROBIC Blood Culture adequate volume   Culture   Final    NO GROWTH 5 DAYS Performed at [facility], [place] [address], [place], [place] [address]    Report Status 01/05/2021 FINAL  Final    Procedures and diagnostic studies:  No results found.             LOS: 7 days   Tyeshia Cornforth  [facility]   Pager on [email or web address]. If 7PM-7AM, please contact night-coverage at [email or web address]     01/05/2021, 12:33 PM

## 2021-01-05 NOTE — Progress Notes (Signed)
Pt refused bipap for the night.

## 2021-01-06 DIAGNOSIS — J449 Chronic obstructive pulmonary disease, unspecified: Secondary | ICD-10-CM

## 2021-01-06 DIAGNOSIS — J9601 Acute respiratory failure with hypoxia: Secondary | ICD-10-CM

## 2021-01-06 LAB — RESP PANEL BY RT-PCR (FLU A&B, COVID) ARPGX2
Influenza A by PCR: NEGATIVE
Influenza B by PCR: NEGATIVE
SARS Coronavirus 2 by RT PCR: NEGATIVE

## 2021-01-06 LAB — HEPARIN LEVEL (UNFRACTIONATED): Heparin Unfractionated: 0.28 IU/mL — ABNORMAL LOW (ref 0.30–0.70)

## 2021-01-06 LAB — GLUCOSE, CAPILLARY
Glucose-Capillary: 101 mg/dL — ABNORMAL HIGH (ref 70–99)
Glucose-Capillary: 129 mg/dL — ABNORMAL HIGH (ref 70–99)
Glucose-Capillary: 158 mg/dL — ABNORMAL HIGH (ref 70–99)
Glucose-Capillary: 165 mg/dL — ABNORMAL HIGH (ref 70–99)
Glucose-Capillary: 174 mg/dL — ABNORMAL HIGH (ref 70–99)
Glucose-Capillary: 194 mg/dL — ABNORMAL HIGH (ref 70–99)
Glucose-Capillary: 94 mg/dL (ref 70–99)

## 2021-01-06 LAB — PROTIME-INR
INR: 2.6 — ABNORMAL HIGH (ref 0.8–1.2)
Prothrombin Time: 27.6 seconds — ABNORMAL HIGH (ref 11.4–15.2)

## 2021-01-06 MED ORDER — PHENYTOIN SODIUM EXTENDED 100 MG PO CAPS: 100.0000 mg | ORAL_CAPSULE | Freq: Two times a day (BID) | ORAL | Status: DC

## 2021-01-06 MED ORDER — GUAIFENESIN 100 MG/5ML PO SOLN: 5.0000 mL | Freq: Two times a day (BID) | ORAL | 0 refills | Status: DC

## 2021-01-06 MED ORDER — WARFARIN SODIUM 1 MG PO TABS: 1.0000 mg | ORAL_TABLET | Freq: Every day | ORAL | Status: DC

## 2021-01-06 MED ORDER — WARFARIN 0.5 MG HALF TABLET
0.5000 mg | ORAL_TABLET | Freq: Once | ORAL | Status: AC
  Administered 2021-01-06: 0.5 mg via ORAL
  Filled 2021-01-06 (×2): qty 1

## 2021-01-06 MED ORDER — WARFARIN SODIUM 2 MG PO TABS: 2.0000 mg | ORAL_TABLET | Freq: Once | ORAL | Status: DC

## 2021-01-06 MED ORDER — PHENYTOIN SODIUM EXTENDED 100 MG PO CAPS
100.0000 mg | ORAL_CAPSULE | Freq: Two times a day (BID) | ORAL | Status: DC
  Administered 2021-01-06 – 2021-01-07 (×3): 100 mg via ORAL
  Filled 2021-01-06 (×4): qty 1

## 2021-01-06 NOTE — NC FL2 (Signed)
[place] MEDICAID FL2 LEVEL OF CARE SCREENING TOOL     IDENTIFICATION  Patient Name: Matthew Gutierrez Birthdate: 1955/12/30 Sex: male Admission Date (Current Location): 12/28/2020  [facility] [place] Number:  Herbalist and Address:  [facility], [place] [address] [place], [place] [address]      Provider Number: [id number]  Attending Physician Name and Address:  Hosie Poisson, MD  Relative Name and Phone Number:  Deward, Schryver (Sister)   [phone] [facility])    Current Level of Care: Hospital Recommended Level of Care: [place] Prior Approval Number:    Date Approved/Denied:   PASRR Number: [id number]  Discharge Plan: SNF    Current Diagnoses: Patient Active Problem List   Diagnosis Date Noted   Acute respiratory distress    Abnormal magnetic resonance cholangiopancreatography (MRCP)    Calculus of bile duct with acute cholangitis with obstruction    Elevated LFTs    Acute cholecystitis 12/29/2020   Sepsis ([place]) 12/29/2020   Seizure disorder ([place]) 12/29/2020   Encephalopathy 12/29/2020   Choledocholithiasis    Schizoaffective disorder ([place]) 03/14/2020   Impaired ambulation 03/14/2020   Epilepsy, grand mal ([place])    Frozen shoulder    Lumbago    Peripheral vascular disease ([place])    Stroke ([place])    Paroxysmal atrial fibrillation ([place]) 01/07/2020   Secondary hypercoagulable state ([place]) 01/07/2020   Cellulitis of leg 12/14/2019   Atrial fibrillation with RVR ([place]) 12/14/2019   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   Gout attack 10/06/2019   Urinary incontinence 07/25/2019   Shoulder bursitis 07/25/2019   Acute renal failure superimposed on stage 3 chronic kidney disease ([place]) 07/23/2019   Mixed hyperlipidemia 07/23/2019   Absence epileptic syndrome, not intractable, without status epilepticus ([place]) 07/23/2019   Atherosclerotic heart disease 07/23/2019   Diabetic  neuropathy ([place]) 07/23/2019   Benign enlargement of prostate 07/23/2019   COPD (chronic obstructive pulmonary disease) ([place]) 07/23/2019   Long term (current) use of anticoagulants 07/23/2019   Morbid obesity ([place]) 07/23/2019   Chronic venous hypertension (idiopathic) with ulcer of left lower extremity ([place]) 07/23/2019   Obstructive sleep apnea 07/23/2019   Incontinence without sensory awareness 07/23/2019   Chronic respiratory failure ([place]) 07/23/2019   Body mass index 50.0-59.9, adult ([place]) 07/23/2019   H/O acute myocardial infarction 01/22/2018   Essential hypertension 01/22/2018   H/O: stroke 01/22/2018   History of DVT (deep vein thrombosis) 01/22/2018   Lymphedema of both lower extremities 01/22/2018   Peripheral vascular disease, unspecified ([place]) 07/12/2011    Orientation RESPIRATION BLADDER Height & Weight     Self, Situation, Time, Place  O2 (2LNC) Incontinent, External catheter Weight: 257 lb 15 oz (117 kg) Height:  6' (182.9 cm)  BEHAVIORAL SYMPTOMS/MOOD NEUROLOGICAL BOWEL NUTRITION STATUS      Incontinent Diet (see d/c summary)  AMBULATORY STATUS COMMUNICATION OF NEEDS Skin   Extensive Assist Verbally PU Stage and Appropriate Care, Surgical wounds (Biliary tube; unstageable pressure wounds on left and right Feet, ankles, and heels)                       Personal Care Assistance Level of Assistance  Bathing, Feeding, Dressing Bathing Assistance: Maximum assistance Feeding assistance: Independent Dressing Assistance: Maximum assistance     Functional Limitations Info  Sight, Speech, Hearing Sight Info: Adequate Hearing Info: Adequate Speech Info: Adequate    SPECIAL CARE FACTORS FREQUENCY  PT (By licensed PT),  OT (By licensed OT)     PT Frequency: 5x/week OT Frequency: 5x/week            Contractures Contractures Info: Not present    Additional Factors Info  Code Status, Allergies Code Status Info: Full Allergies Info: Codeine            Current Medications (01/06/2021):  This is the current hospital active medication list Current Facility-Administered Medications  Medication Dose Route Frequency Provider Last Rate Last Admin   acetaminophen (TYLENOL) tablet 650 mg  650 mg Oral Q6H PRN Minor, Grace Bushy, NP   650 mg at 01/02/21 1945   Or   acetaminophen (TYLENOL) suppository 650 mg  650 mg Rectal Q6H PRN Minor, Grace Bushy, NP   650 mg at 12/30/20 0622   albuterol (PROVENTIL) (2.5 MG/3ML) 0.083% nebulizer solution 2.5 mg  2.5 mg Inhalation Q4H PRN Minor, Grace Bushy, NP   2.5 mg at 12/31/20 [id number]   Chlorhexidine Gluconate Cloth 2 % PADS 6 each  6 each Topical Daily Minor, Grace Bushy, NP   6 each at 01/06/21 1200   collagenase (SANTYL) ointment   Topical Daily Minor, Grace Bushy, NP   1 application at 0000000 1726   divalproex (DEPAKOTE) DR tablet 125 mg  125 mg Oral Q12H Simonne Maffucci B, MD   125 mg at 01/06/21 1014   And   divalproex (DEPAKOTE) DR tablet 500 mg  500 mg Oral Q12H Simonne Maffucci B, MD   500 mg at 01/06/21 1022   gabapentin (NEURONTIN) capsule 300 mg  300 mg Oral QHS Minor, Grace Bushy, NP   300 mg at 01/05/21 2215   guaiFENesin (ROBITUSSIN) 100 MG/5ML solution 100 mg  5 mL Oral BID Magdalen Spatz, NP   100 mg at 01/06/21 1014   hydrALAZINE (APRESOLINE) injection 10 mg  10 mg Intravenous Q4H PRN Minor, Grace Bushy, NP       insulin aspart (novoLOG) injection 0-9 Units  0-9 Units Subcutaneous Q4H Magdalen Spatz, NP   2 Units at 01/06/21 1203   metoprolol tartrate (LOPRESSOR) injection 5 mg  5 mg Intravenous Q2H PRN Spero Geralds, MD   5 mg at 12/31/20 1515   ondansetron (ZOFRAN) tablet 4 mg  4 mg Oral Q6H PRN Minor, Grace Bushy, NP       Or   ondansetron (ZOFRAN) injection 4 mg  4 mg Intravenous Q6H PRN Minor, Grace Bushy, NP   4 mg at 01/05/21 2029   phenytoin (DILANTIN) ER capsule 100 mg  100 mg Oral BID Hosie Poisson, MD       sodium chloride flush (NS) 0.9 % injection 5 mL  5 mL Intracatheter Q12H Minor, Grace Bushy,  NP   5 mL at 01/06/21 1344   traZODone (DESYREL) tablet 50 mg  50 mg Oral QHS PRN Minor, Grace Bushy, NP   50 mg at 01/04/21 2208   warfarin (COUMADIN) tablet 0.5 mg  0.5 mg Oral ONCE-1600 Paytes, Earl Gala, RPH       Warfarin - Pharmacist Dosing Inpatient   Does not apply q1600 Priscella Mann [facility]   Given at 01/05/21 1800     Discharge Medications: Please see discharge summary for a list of discharge medications.  Relevant Imaging Results:  Relevant Lab Results:   Additional Information [id number]  Bethann Berkshire, LCSW

## 2021-01-06 NOTE — Discharge Summary (Signed)
Physician Discharge Summary  Aidean Fawcett [id number] DOB: 16-Oct-1955 DOA: 12/28/2020  PCP: Lillard Anes, MD  Admit date: 12/28/2020 Discharge date: 01/06/2021  Admitted From: snf Disposition:  SNF  Recommendations for Outpatient Follow-up:  Follow up with PCP in 1-2 weeks Please obtain BMP/CBC in one week Please follow up with IR for followup .  Please follow up with cardiology in 4 to 6 weeks.  Please follow up with pulmonology as needed.  Please check INR and dose coumadin to keep the INr between 2 to 3.   Discharge Condition:guarded.  CODE STATUS:full code Diet recommendation: Dysphagia 3 diet.   Brief/Interim Summary: Erie Heckman is a 65 y.o. male  with medical history significant for DMT2, CKD 3, a-fib, COPD, neuropathy due to diabetes, history of stroke, OSA, PVD, who was brought to the hospital from nursing home because of altered mental status and hypoxia with oxygen saturation in the 80s.   He was admitted to the hospital for acute ascending cholangitis.  He was treated with empiric antibiotics.  Cholecystostomy tube was placed by IR on 12/29/2020.  He underwent ERCP on 12/30/2020.  Following the procedure, he developed atrial fibrillation with RVR and right lung atelectasis with mediastinal shift felt to be due to aspiration pneumonia and mucous plugging. He completed the course of antibiotics .     Discharge Diagnoses:  Principal Problem:   Acute cholecystitis Active Problems:   Essential hypertension   H/O: stroke   Acute renal failure superimposed on stage 3 chronic kidney disease (HCC)   COPD (chronic obstructive pulmonary disease) ([place])   Long term (current) use of anticoagulants   Obstructive sleep apnea   Paroxysmal atrial fibrillation (HCC)   Sepsis (HCC)   Seizure disorder (HCC)   Encephalopathy   Abnormal magnetic resonance cholangiopancreatography (MRCP)   Calculus of bile duct with acute cholangitis with obstruction   Elevated LFTs   Acute  respiratory distress   Sepsis secondary to ascending cholangitis and cholecystitis due to Klebsiella pneumoniae, aspiration pneumonia: S/p cholecystostomy tube placed on 12/29/2020.  S/p ERCP with biliary sphincterotomy on 12/30/2020.  Completed 7 days of antibiotics on 01/04/2021. IR recommends outpatient follow-up in 6 weeks for cholangiogram.   Acute on chronic hypoxic and hypercapnic respiratory failure: Continue 2 L/min oxygen via nasal cannula.   Acute toxic metabolic encephalopathy: Improved.  COPD, OSA: PCCM recommended  BIPAP at night and  Continue bronchodilators. He is currently requiring up to 2 lit of [place] oxygen.  Atrial fibrillation with RVR:  Continue warfarin and monitor INR INR is therapeutic.  CHA2DS2-VASc is 7. Appreciate cardiology recommendations.    Seizure disorder: Continue Dilantin and Depakote.  Corrected Dilantin level is okay.    CKD stage IIIb: Creatinine stable   Unstageable bilateral heel, left and right ankle decubitus ulcers (all present on admission): Continue local wound care.  Left upper extremity swelling: negative for DVT on venous duplex of the left upper extremity   Plan to discharge to SNF when INR is therapeutic.      Discharge Instructions   Allergies as of 01/06/2021       Reactions   Codeine Other (See Comments)   Unknown Reaction        Medication List     STOP taking these medications    allopurinol 300 MG tablet Commonly known as: ZYLOPRIM   aspirin 325 MG tablet   carvedilol 3.125 MG tablet Commonly known as: COREG   diltiazem 60 MG tablet Commonly known as: CARDIZEM   isosorbide  mononitrate 120 MG 24 hr tablet Commonly known as: IMDUR   metFORMIN 1000 MG tablet Commonly known as: GLUCOPHAGE   metoprolol succinate 25 MG 24 hr tablet Commonly known as: TOPROL-XL   potassium chloride 10 MEQ tablet Commonly known as: KLOR-CON   pravastatin 40 MG tablet Commonly known as: PRAVACHOL   torsemide 20 MG  tablet Commonly known as: DEMADEX   Toviaz 4 MG Tb24 tablet Generic drug: fesoterodine   triamcinolone cream 0.1 % Commonly known as: KENALOG       TAKE these medications    albuterol (2.5 MG/3ML) 0.083% nebulizer solution Commonly known as: PROVENTIL Inhale 3 mLs (2.5 mg total) into the lungs every 6 (six) hours as needed for wheezing or shortness of breath.   atorvastatin 10 MG tablet Commonly known as: LIPITOR Take 10 mg by mouth daily.   divalproex 125 MG DR tablet Commonly known as: DEPAKOTE Take 125 mg by mouth 2 (two) times daily.   divalproex 500 MG DR tablet Commonly known as: DEPAKOTE Take 500 mg by mouth 2 (two) times daily.   DULoxetine 60 MG capsule Commonly known as: CYMBALTA Take 60 mg by mouth daily.   EPINEPHrine 0.3 mg/0.3 mL Soaj injection Commonly known as: EPI-PEN Inject 0.3 mg into the muscle as needed for anaphylaxis.   fluticasone 50 MCG/ACT nasal spray Commonly known as: FLONASE Place 1 spray into both nostrils daily. What changed:  when to take this reasons to take this   gabapentin 300 MG capsule Commonly known as: NEURONTIN Take 1 capsule (300 mg total) by mouth at bedtime.   glucose blood test strip 1 each by Other route as needed. Use as instructed   guaiFENesin 100 MG/5ML Soln Commonly known as: ROBITUSSIN Take 5 mLs (100 mg total) by mouth 2 (two) times daily.   phenytoin 100 MG ER capsule Commonly known as: DILANTIN Take 1 capsule (100 mg total) by mouth 2 (two) times daily. What changed:  medication strength how much to take additional instructions   polyethylene glycol powder 17 GM/SCOOP powder Commonly known as: GLYCOLAX/MIRALAX Take 1 Container by mouth every morning.   risperiDONE 1 MG tablet Commonly known as: RISPERDAL Take 1 mg by mouth 2 (two) times daily.   Trulicity [id number] Sopn Generic drug: Dulaglutide Inject 0.75 mg into the skin every Tuesday.   Vitamin D3 1.25 MG (50000 UT) Tabs Take  50,000 Units by mouth 2 (two) times a week. Every Wed and Sat   warfarin 1 MG tablet Commonly known as: COUMADIN Take 1 tablet (1 mg total) by mouth daily at 4 PM. Please check INR tomorrow and dose coumadin as per the INR. Start taking on: January 07, 2021 What changed:  medication strength how much to take when to take this additional instructions Another medication with the same name was removed. Continue taking this medication, and follow the directions you see here.        Follow-up Information     Georganna Skeans, MD Follow up.   Specialty: General Surgery Why: follow up appointment on 01/20/21 at 11 am, please arrive at 10:30 am to complete check in process. Please bring photo ID and insurance card Contact information: [place] [address] [phone]         [facility] .   Specialty: Radiology Contact information: [address] [id number] [place] [phone]        Michaelle Birks, MD Follow up.  Specialties: Interventional Radiology, Diagnostic Radiology, Radiology Why: 6 week follow up (around 9/13) for cholangiogram and possible capping trial. Contact information: [address] [place] [address] [phone]                Allergies  Allergen Reactions   Codeine Other (See Comments)    Unknown Reaction    Consultations: Cardiology PCCM.    Procedures/Studies: CT HEAD WO CONTRAST (5MM)  Result Date: 12/28/2020 CLINICAL DATA:  Mental status change, unknown cause altered mental status. EXAM: CT HEAD WITHOUT CONTRAST TECHNIQUE: Contiguous axial images were obtained from the base of the skull through the vertex without intravenous contrast. COMPARISON:  None. FINDINGS: Brain: Normal anatomic configuration. There is moderate parenchymal volume loss, slightly advanced given the patient's age. Remote lacunar infarcts are noted  within the left caudate nucleus, left insular cortex, and right thalamus. Periventricular white matter changes are present likely reflecting the sequela of small vessel ischemia. No abnormal intra or extra-axial mass lesion or fluid collection. No abnormal mass effect or midline shift. No evidence of acute intracranial hemorrhage or infarct. Ventricular size is normal. Cerebellum unremarkable. Vascular: No asymmetric hyperdense vasculature at the skull base. Skull: Intact Sinuses/Orbits: Paranasal sinuses are clear. Orbits are unremarkable. Other: Mastoid air cells and middle ear cavities are clear. IMPRESSION: No acute intracranial abnormality. Advanced senescent change. Multiple remote infarcts as noted above. Electronically Signed   By: Fidela Salisbury MD   On: 12/28/2020 23:45   CT ABDOMEN PELVIS W CONTRAST  Result Date: 12/28/2020 CLINICAL DATA:  Abdominal pain, biliary obstruction suspected (Ped 0-18y) EXAM: CT ABDOMEN AND PELVIS WITH CONTRAST TECHNIQUE: Multidetector CT imaging of the abdomen and pelvis was performed using the standard protocol following bolus administration of intravenous contrast. CONTRAST:  158m OMNIPAQUE IOHEXOL 300 MG/ML  SOLN COMPARISON:  04/11/2020 FINDINGS: Lower chest: Small right pleural effusion noted at the visualized right lung base with right basilar atelectasis. Mild left basilar atelectasis. Extensive multi-vessel coronary artery calcification with probable stenting of the left anterior descending coronary artery. Cardiac size is mildly enlarged. No pericardial effusion. Hepatobiliary: The gallbladder is distended, the gallbladder wall is thickened, and there is extensive pericholecystic inflammatory change in keeping with changes of acute cholecystitis. The extrahepatic bile duct is not dilated. There is no intrahepatic biliary ductal dilation. The liver is unremarkable. Pancreas: Unremarkable Spleen: Unremarkable Adrenals/Urinary Tract: Adrenal glands are unremarkable.  Kidneys are normal, without renal calculi, focal lesion, or hydronephrosis. Bladder is unremarkable. Stomach/Bowel: The stomach, small bowel, and large bowel are unremarkable. The appendix is not visualized and is likely absent. No free intraperitoneal gas or fluid. Vascular/Lymphatic: Mild aortoiliac atherosclerotic calcification. Advanced atherosclerotic calcification is seen within the visualized lower extremity arterial outflow. No aortic aneurysm. No pathologic adenopathy within the abdomen and pelvis. Reproductive: Prostate is unremarkable. Other: Tiny fat containing umbilical hernia.  Rectum unremarkable. Musculoskeletal: No acute bone abnormality. No lytic or blastic bone lesions are seen. IMPRESSION: Acute cholecystitis.  Surgical consultation is advised. Extensive coronary artery calcification.  Mild global cardiomegaly. Aortic Atherosclerosis (ICD10-I70.0). Electronically Signed   By: AFidela SalisburyMD   On: 12/28/2020 23:50   MR ABDOMEN MRCP WO CONTRAST  Result Date: 12/29/2020 CLINICAL DATA:  Biliary obstruction, choledocholithiasis, altered mental status, acute cholecystitis, status post percutaneous cholecystostomy EXAM: MRI ABDOMEN WITHOUT CONTRAST  (INCLUDING MRCP) TECHNIQUE: Multiplanar multisequence MR imaging of the abdomen was performed. Heavily T2-weighted images of the biliary and pancreatic ducts were obtained, and three-dimensional MRCP images were rendered by post  processing. COMPARISON:  CT abdomen pelvis, 12/28/2020, percutaneous cholecystostomy, 12/29/2020 FINDINGS: Lower chest: Small bilateral pleural effusions and associated atelectasis or consolidation. Cardiomegaly. Hepatobiliary: Mild hepatic steatosis. No mass or other parenchymal abnormality identified. The gallbladder is distended, with wall thickening and pericholecystic fluid, containing numerous tiny gallstones. Percutaneous cholecystostomy tube is in place, with formed pigtail in the gallbladder fundus. There is a small  calculus in the distal common bile duct measuring 4 mm (series 8, image 27). No biliary ductal dilatation. Mild periportal edema. Pancreas: No mass, inflammatory changes, or other parenchymal abnormality identified. Spleen:  Splenomegaly, maximum coronal span 15.5 cm. Adrenals/Urinary Tract: No masses identified. No evidence of hydronephrosis. Stomach/Bowel: Visualized portions within the abdomen are unremarkable. Vascular/Lymphatic: No pathologically enlarged lymph nodes identified. No abdominal aortic aneurysm demonstrated. Other:  Small volume perihepatic ascites. Musculoskeletal: No suspicious bone lesions identified. IMPRESSION: 1. Percutaneous cholecystostomy tube is in place, with formed pigtail in the gallbladder fundus. 2. The gallbladder is distended, with wall thickening and pericholecystic fluid, containing numerous tiny gallstones. Findings are consistent with acute cholecystitis. 3. There is a 4 mm calculus in the distal common bile duct within the pancreatic head. No biliary ductal dilatation. 4. Mild hepatic steatosis. 5. Splenomegaly. 6. Small volume perihepatic ascites. 7. Small bilateral pleural effusions and associated atelectasis or consolidation. 8. Cardiomegaly. Electronically Signed   By: Eddie Candle M.D.   On: 12/29/2020 13:40   IR Perc Cholecystostomy  Result Date: 12/29/2020 INDICATION: 65 year old male with acute cholecystitis EXAM: ULTRASOUND AND FLUOROSCOPIC-GUIDED CHOLECYSTOSTOMY TUBE PLACEMENT COMPARISON:  CT Abdomen Pelvis, 12/28/2020. MEDICATIONS: The patient is currently admitted to the hospital and on intravenous antibiotics. Antibiotics were administered within an appropriate time frame prior to skin puncture. ANESTHESIA/SEDATION: Moderate (conscious) sedation was employed during this procedure. A total of Versed 1.5 mg and Fentanyl 75 mcg was administered intravenously. Moderate Sedation Time: 26 minutes. The patient's level of consciousness and vital signs were monitored  continuously by radiology nursing throughout the procedure under my direct supervision. CONTRAST:  69m OMNIPAQUE IOHEXOL 300 MG/ML SOLN - administered into the gallbladder fossa. FLUOROSCOPY TIME:  1 minutes 0 seconds (9 mGy) COMPLICATIONS: None immediate. PROCEDURE: Informed written consent was obtained from the patient and/or patient's representative after a discussion of the risks, benefits and alternatives to treatment. Questions regarding the procedure were encouraged and answered. A timeout was performed prior to the initiation of the procedure. The right upper abdominal quadrant was prepped and draped in the usual sterile fashion, and a sterile drape was applied covering the operative field. Maximum barrier sterile technique with sterile gowns and gloves were used for the procedure. A timeout was performed prior to the initiation of the procedure. Local anesthesia was provided with 1% lidocaine with epinephrine. Ultrasound scanning of the right upper quadrant demonstrates a markedly dilated gallbladder. Utilizing a transhepatic approach, a 22 gauge needle was advanced into the gallbladder under direct ultrasound guidance. An ultrasound image was saved for documentation purposes. Appropriate intraluminal puncture was confirmed with the efflux of bile and advancement of an 0.018 wire into the gallbladder lumen. The needle was exchanged for an APut-in-[place]. A small amount of contrast was injected to confirm appropriate intraluminal positioning. Over a [name] wire, a 1532-French Cook cholecystomy tube was advanced into the gallbladder fossa, coiled and locked. Bile was aspirated and a small amount of contrast was injected as several post procedural spot radiographic images were obtained in various obliquities. The catheter was secured to the skin with suture, connected to a drainage bag  and a dressing was placed. The patient tolerated the procedure well without immediate post procedural complication.  IMPRESSION: Successful placement of a 10.2 French cholecystostomy tube, as above. Michaelle Birks, MD [facility] Electronically Signed   By: Michaelle Birks MD   On: 12/29/2020 16:50   DG CHEST PORT 1 VIEW  Result Date: 01/03/2021 CLINICAL DATA:  Acute respiratory failure EXAM: PORTABLE CHEST 1 VIEW COMPARISON:  01/02/2021 FINDINGS: Cardiac shadow is enlarged. Significant improved aeration in the right lung is noted with persistent basilar airspace opacity. Left lung demonstrates basilar atelectasis. No bony abnormality is seen. IMPRESSION: Significant improved aeration of the right lung as described. Electronically Signed   By: Inez Catalina M.D.   On: 01/03/2021 02:10   DG CHEST PORT 1 VIEW  Result Date: 01/02/2021 CLINICAL DATA:  Acute respiratory failure with hypoxia EXAM: PORTABLE CHEST 1 VIEW COMPARISON:  01/02/2021 FINDINGS: Complete opacification of the right hemithorax. Left lower atelectasis or infiltrate. Heart is normal size. No acute bony abnormality. IMPRESSION: Complete opacification of the right hemithorax. Left base atelectasis or infiltrate. Electronically Signed   By: Rolm Baptise M.D.   On: 01/02/2021 17:30   DG CHEST PORT 1 VIEW  Result Date: 01/02/2021 CLINICAL DATA:  65 year old male with history of hypoxemia. EXAM: PORTABLE CHEST 1 VIEW COMPARISON:  Chest x-ray 01/01/2021. FINDINGS: Near complete opacification of the right hemithorax. Left basilar opacity which may reflect atelectasis and/or consolidation. Small left pleural effusion. No pneumothorax. Heart size is mildly enlarged. Upper mediastinal contours are distorted by patient positioning. IMPRESSION: 1. Dramatic worsening aeration in the right lung concerning for enlarging large right pleural effusion with worsening atelectasis and/or consolidation in the underlying lung. 2. Small amount of atelectasis and/or consolidation in the left lower lobe with small left pleural  effusion. Electronically Signed   By: Vinnie Langton M.D.   On: 01/02/2021 08:31   DG Chest Port 1 View  Result Date: 01/01/2021 CLINICAL DATA:  Hypoxia. EXAM: PORTABLE CHEST 1 VIEW COMPARISON:  A 08/2020 FINDINGS: Right lower lobe consolidation and right effusion unchanged. Mild left lower lobe atelectasis. Negative for heart failure or edema. IMPRESSION: Right lower lobe consolidation and right effusion unchanged. Electronically Signed   By: Franchot Gallo M.D.   On: 01/01/2021 08:05   DG CHEST PORT 1 VIEW  Result Date: 12/31/2020 CLINICAL DATA:  Hypoxia. EXAM: PORTABLE CHEST 1 VIEW COMPARISON:  12/31/2020. FINDINGS: Cardiomegaly. No pulmonary venous congestion. Interim decrease opacification right hemithorax with interim visualization of the right upper lung. Findings consistent with partial resolution of large right pleural effusion. Large right pleural effusion remains. Underlying pulmonary disease cannot be excluded. No pneumothorax. IMPRESSION: 1. Findings consistent with interim partial resolution of large right pleural effusion. Large right pleural effusion remains. Underlying right lung disease cannot be excluded. 2.  Cardiomegaly.  No pulmonary venous congestion. Electronically Signed   By: Marcello Moores  Register   On: 12/31/2020 06:07   DG Chest Port 1 View  Result Date: 12/31/2020 CLINICAL DATA:  Acute respiratory distress. EXAM: PORTABLE CHEST 1 VIEW COMPARISON:  Chest radiograph dated 12/29/2020. FINDINGS: There is complete opacification of the right imaged thorax with slight shift of the mediastinum to the right of the midline suggestive of volume loss. This is new since the prior radiograph and may represent atelectasis, infiltrate, or combination of pleural effusion and associated atelectasis/infiltrate. Occlusion of the central airway is not excluded. Linear density at the left lung base as seen on the prior radiograph. No  pneumothorax. No acute osseous pathology. IMPRESSION: Complete  opacification of the right imaged thorax with slight shift of the mediastinum to the right of the midline suggestive of volume loss. Clinical correlation recommended. These results were called by telephone at the time of interpretation on 12/31/2020 at 2:10 am to nurse Riley Churches , who verbally acknowledged these results. Electronically Signed   By: Anner Crete M.D.   On: 12/31/2020 02:18   DG CHEST PORT 1 VIEW  Result Date: 12/29/2020 CLINICAL DATA:  Hypoxia EXAM: PORTABLE CHEST 1 VIEW COMPARISON:  Chest x-ray 12/28/2020, CT abdomen pelvis 12/28/2020 FINDINGS: Enlarged cardiac silhouette. The heart size and mediastinal contours are unchanged. Low lung volumes with bibasilar streaky airspace opacities likely representing atelectasis. Elevated right hemidiaphragm no focal consolidation. No pulmonary edema. Trace right pleural effusion. No left pleural effusion. No pneumothorax. No acute osseous abnormality. IMPRESSION: 1. Trace right pleural effusion. 2. Low lung volumes. 3. Elevated right hemidiaphragm. Electronically Signed   By: Iven Finn M.D.   On: 12/29/2020 03:59   DG Chest Portable 1 View  Result Date: 12/28/2020 CLINICAL DATA:  Tachycardia. EXAM: PORTABLE CHEST 1 VIEW COMPARISON:  04/11/2020 FINDINGS: Low lung volumes persist. Stable heart size, likely accentuated by technique and low lung volumes. Unchanged mediastinal contours. Streaky bibasilar atelectasis or scarring. No pulmonary edema, pleural effusion or pneumothorax. Patient's chin partially obscures the right lung apex. No acute osseous abnormalities are seen. IMPRESSION: Low lung volumes with streaky bibasilar atelectasis or scarring. Electronically Signed   By: Keith Rake M.D.   On: 12/28/2020 22:58   DG ERCP  Result Date: 12/31/2020 CLINICAL DATA:  65 year old male undergoing ERCP for choledocholithiasis EXAM: ERCP TECHNIQUE: Multiple spot images obtained with the fluoroscopic device and submitted for interpretation  post-procedure. FLUOROSCOPY TIME:  Fluoroscopy Time:  3 minutes 55 seconds Radiation Exposure Index (if provided by the fluoroscopic device): 182.4 Number of Acquired Spot Images: 0 COMPARISON:  MRCP 12/29/2020 FINDINGS: A total of 14 intraoperative saved images are submitted for review. The images demonstrate a flexible duodenal scope in the descending duodenum. A percutaneous cholecystostomy tube is present. Subsequent images demonstrate cannulation of the common bile duct. Cholangiogram demonstrates a small filling defect in the distal common bile duct consistent with choledocholithiasis. The filling defect is no longer present on the final images consistent with removal. IMPRESSION: 1. Choledocholithiasis. 2. Percutaneous cholecystostomy tube in place. 3. ERCP as above. These images were submitted for radiologic interpretation only. Please see the procedural report for the amount of contrast and the fluoroscopy time utilized. Electronically Signed   By: Jacqulynn Cadet M.D.   On: 12/31/2020 08:41   ECHOCARDIOGRAM COMPLETE  Result Date: 01/01/2021    ECHOCARDIOGRAM REPORT   Patient Name:   HAPPY COVIN Date of Exam: 01/01/2021 Medical Rec #:  [id number]      Height:       72.0 in Accession #:    [id number]     Weight:       255.7 lb Date of Birth:  1955/08/20      BSA:          2.365 m Patient Age:    48 years       BP:           138/64 mmHg Patient Gender: M              HR:           64 bpm. Exam Location:  Inpatient Procedure: 2D Echo, Cardiac Doppler, Color Doppler  and Intracardiac            Opacification Agent Indications:    I48.0 Paroxysmal atrial fibrillation  History:        Patient has prior history of Echocardiogram examinations, most                 recent 12/15/2019. COPD and Stroke; Risk Factors:Hypertension,                 Diabetes and Dyslipidemia. CKD.  Sonographer:    Jonelle Sidle Dance Referring Phys: [id number] Margie Billet IMPRESSIONS  1. Left ventricular ejection fraction, by estimation, is 60 to  65%. The left ventricle has normal function. The left ventricle has no regional wall motion abnormalities. Left ventricular diastolic parameters were normal.  2. Right ventricular systolic function is normal. The right ventricular size is normal.  3. The mitral valve is normal in structure. No evidence of mitral valve regurgitation.  4. The aortic valve is normal in structure. Aortic valve regurgitation is not visualized.  5. Aortic dilatation noted. There is borderline dilatation of the ascending aorta, measuring 39 mm. FINDINGS  Left Ventricle: Left ventricular ejection fraction, by estimation, is 60 to 65%. The left ventricle has normal function. The left ventricle has no regional wall motion abnormalities. Definity contrast agent was given IV to delineate the left ventricular  endocardial borders. The left ventricular internal cavity size was normal in size. There is no left ventricular hypertrophy. Left ventricular diastolic parameters were normal. Right Ventricle: The right ventricular size is normal. Right vetricular wall thickness was not well visualized. Right ventricular systolic function is normal. Left Atrium: Left atrial size was normal in size. Right Atrium: Right atrial size was normal in size. Pericardium: There is no evidence of pericardial effusion. Mitral Valve: The mitral valve is normal in structure. No evidence of mitral valve regurgitation. Tricuspid Valve: The tricuspid valve is normal in structure. Tricuspid valve regurgitation is trivial. Aortic Valve: The aortic valve is normal in structure. Aortic valve regurgitation is not visualized. Pulmonic Valve: The pulmonic valve was normal in structure. Pulmonic valve regurgitation is not visualized. Aorta: Aortic dilatation noted. There is borderline dilatation of the ascending aorta, measuring 39 mm. IAS/Shunts: The atrial septum is grossly normal.  LEFT VENTRICLE PLAX 2D LVIDd:         5.20 cm  Diastology LVIDs:         3.30 cm  LV e' medial:     5.33 cm/s LV PW:         1.50 cm  LV E/e' medial:  11.4 LV IVS:        1.30 cm  LV e' lateral:   8.27 cm/s LVOT diam:     2.10 cm  LV E/e' lateral: 7.4 LV SV:         46 LV SV Index:   19 LVOT Area:     3.46 cm  RIGHT VENTRICLE             IVC RV S prime:     14.50 cm/s  IVC diam: 1.90 cm TAPSE (M-mode): 2.3 cm LEFT ATRIUM           Index LA diam:      4.70 cm 1.99 cm/m LA Vol (A4C): 49.4 ml 20.89 ml/m  AORTIC VALVE LVOT Vmax:   65.30 cm/s LVOT Vmean:  41.900 cm/s LVOT VTI:    0.133 m  AORTA Ao Root diam: 3.80 cm Ao Asc diam:  3.90 cm MITRAL VALVE MV  Area (PHT): 3.58 cm    SHUNTS MV Decel Time: 212 msec    Systemic VTI:  0.13 m MV E velocity: 60.80 cm/s  Systemic Diam: 2.10 cm MV A velocity: 72.00 cm/s MV E/A ratio:  0.84 Mertie Moores MD Electronically signed by Mertie Moores MD Signature Date/Time: 01/01/2021/4:57:26 PM    Final    VAS [place] UPPER EXTREMITY VENOUS DUPLEX  Result Date: 01/05/2021 UPPER VENOUS STUDY  Patient Name:  MCCAIN BUCHBERGER  Date of Exam:   01/05/2021 Medical Rec #: [id number]       Accession #:    [id number] Date of Birth: February 16, 1956       Patient Gender: M Patient Age:   50 years Exam Location:  [facility] Procedure:      VAS [place] UPPER EXTREMITY VENOUS DUPLEX Referring Phys: Jennye Boroughs --------------------------------------------------------------------------------  Indications: Edema Limitations: Poor ultrasound/tissue interface, body habitus and restricted mobility. Comparison Study: No prior study Performing Technologist: Maudry Mayhew MHA, RDMS, RVT, RDCS  Examination Guidelines: A complete evaluation includes B-mode imaging, spectral Doppler, color Doppler, and power Doppler as needed of all accessible portions of each vessel. Bilateral testing is considered an integral part of a complete examination. Limited examinations for reoccurring indications may be performed as noted.  Right Findings: +----------+------------+---------+-----------+----------+-------+ RIGHT      CompressiblePhasicitySpontaneousPropertiesSummary +----------+------------+---------+-----------+----------+-------+ Subclavian               Yes       Yes                      +----------+------------+---------+-----------+----------+-------+  Left Findings: +----------+------------+---------+-----------+----------+--------------+ LEFT      CompressiblePhasicitySpontaneousProperties   Summary     +----------+------------+---------+-----------+----------+--------------+ IJV           Full       Yes       Yes                             +----------+------------+---------+-----------+----------+--------------+ Subclavian    Full       Yes       Yes                             +----------+------------+---------+-----------+----------+--------------+ Axillary      Full       Yes       Yes                             +----------+------------+---------+-----------+----------+--------------+ Brachial      Full       Yes       Yes                             +----------+------------+---------+-----------+----------+--------------+ Radial        Full                                                 +----------+------------+---------+-----------+----------+--------------+ Ulnar  Not visualized +----------+------------+---------+-----------+----------+--------------+ Cephalic      Full                                                 +----------+------------+---------+-----------+----------+--------------+ Basilic                                             Not visualized +----------+------------+---------+-----------+----------+--------------+  Summary:  Right: No evidence of thrombosis in the subclavian.  Left: No evidence of deep vein thrombosis in the upper extremity. No evidence of superficial vein thrombosis in the upper extremity. Limited due to technical limitations as described above.  *See table(s)  above for measurements and observations.  Diagnosing physician: Ruta Hinds MD Electronically signed by Ruta Hinds MD on 01/05/2021 at 5:32:59 PM.    Final    US Abdomen Limited RUQ (LIVER/GB)  Result Date: 12/28/2020 CLINICAL DATA:  Abdominal pain. EXAM: ULTRASOUND ABDOMEN LIMITED RIGHT UPPER QUADRANT COMPARISON:  CT abdomen pelvis 04/11/2020 FINDINGS: Gallbladder: Abnormal gallbladder markedly limited evaluation - question of a heterogeneous structure distending the gallbladder. Common bile duct: Diameter: 4 mm Liver: Markedly limited evaluation. Unable to evaluate for focal hepatic lesion. Normal parenchymal echogenicity. Portal vein is patent on color Doppler imaging with normal direction of blood flow towards the liver. Other: None. IMPRESSION: 1. Abnormal gallbladder with markedly limited/nondiagnostic evaluation - question of a heterogeneous structure distending the gallbladder. Recommend CT abdomen pelvis with intravenous contrast for further evaluation. 2. Markedly limited evaluation of the liver due to decreased penetration of the sonographic waves. Electronically Signed   By: Iven Finn M.D.   On: 12/28/2020 22:45      Subjective: Comfortable. No new complaints.   Discharge Exam: Vitals:   01/06/21 1200 01/06/21 1352  BP: (!) 148/65 (!) 130/45  Pulse: 65 (!) 57  Resp: 18 16  Temp: 97.8 F (36.6 C) 98 F (36.7 C)  SpO2: 97% 99%   Vitals:   01/06/21 0825 01/06/21 1000 01/06/21 1200 01/06/21 1352  BP: (!) 147/70 135/65 (!) 148/65 (!) 130/45  Pulse: 62 (!) 54 65 (!) 57  Resp: (!) '21 18 18 16  '$ Temp:   97.8 F (36.6 C) 98 F (36.7 C)  TempSrc:   Oral Oral  SpO2: 97% 99% 97% 99%  Weight:      Height:        General: Pt is alert, awake, not in acute distress Cardiovascular: RRR, S1/S2 +, no rubs, no gallops Respiratory: CTA bilaterally, no wheezing, no rhonchi Abdominal: Soft, NT, ND, bowel sounds + Extremities: no edema, no cyanosis    The results of  significant diagnostics from this hospitalization (including imaging, microbiology, ancillary and laboratory) are listed below for reference.     Microbiology: Recent Results (from the past 240 hour(s))  Resp Panel by RT-PCR (Flu A&B, Covid) Nasopharyngeal Swab     Status: None   Collection Time: 12/28/20  9:42 PM   Specimen: Nasopharyngeal Swab; Nasopharyngeal(NP) swabs in vial transport medium  Result Value Ref Range Status   SARS Coronavirus 2 by RT PCR NEGATIVE NEGATIVE Final    Comment: (NOTE) SARS-CoV-2 target nucleic acids are NOT DETECTED.  The SARS-CoV-2 RNA is generally detectable in upper respiratory specimens during the acute phase of infection. The lowest concentration  of SARS-CoV-2 viral copies this assay can detect is 138 copies/mL. A negative result does not preclude SARS-Cov-2 infection and should not be used as the sole basis for treatment or other patient management decisions. A negative result may occur with  improper specimen collection/handling, submission of specimen other than nasopharyngeal swab, presence of viral mutation(s) within the areas targeted by this assay, and inadequate number of viral copies(<138 copies/mL). A negative result must be combined with clinical observations, patient history, and epidemiological information. The expected result is Negative.  Fact Sheet for Patients:  EntrepreneurPulse.com.au  Fact Sheet for Healthcare Providers:  IncredibleEmployment.be  This test is no t yet approved or cleared by the [place] FDA and  has been authorized for detection and/or diagnosis of SARS-CoV-2 by FDA under an Emergency Use Authorization (EUA). This EUA will remain  in effect (meaning this test can be used) for the duration of the COVID-19 declaration under Section 564(b)(1) of the Act, 21 U.S.C.section 360bbb-3(b)(1), unless the authorization is terminated  or revoked sooner.       Influenza A by  PCR NEGATIVE NEGATIVE Final   Influenza B by PCR NEGATIVE NEGATIVE Final    Comment: (NOTE) The Xpert Xpress SARS-CoV-2/FLU/RSV plus assay is intended as an aid in the diagnosis of influenza from Nasopharyngeal swab specimens and should not be used as a sole basis for treatment. Nasal washings and aspirates are unacceptable for Xpert Xpress SARS-CoV-2/FLU/RSV testing.  Fact Sheet for Patients: EntrepreneurPulse.com.au  Fact Sheet for Healthcare Providers: IncredibleEmployment.be  This test is not yet approved or cleared by the [place] FDA and has been authorized for detection and/or diagnosis of SARS-CoV-2 by FDA under an Emergency Use Authorization (EUA). This EUA will remain in effect (meaning this test can be used) for the duration of the COVID-19 declaration under Section 564(b)(1) of the Act, 21 U.S.C. section 360bbb-3(b)(1), unless the authorization is terminated or revoked.  Performed at [facility], [place] [address] [place], [place] [address]   Culture, blood (Routine x 2)     Status: Abnormal   Collection Time: 12/28/20  9:45 PM   Specimen: BLOOD  Result Value Ref Range Status   Specimen Description BLOOD RIGHT ANTECUBITAL  Final   Special Requests   Final    BOTTLES DRAWN AEROBIC AND ANAEROBIC Blood Culture adequate volume   Culture  Setup Time   Final    GRAM NEGATIVE RODS AEROBIC BOTTLE ONLY CRITICAL RESULT CALLED TO, READ BACK BY AND VERIFIED WITH: RN C.COBB ON [id number] AT [id number] BY [place] Performed at [facility], [place] [address] [place], [place] [address]    Culture KLEBSIELLA PNEUMONIAE (A)  Final   Report Status 12/31/2020 FINAL  Final   Organism ID, Bacteria KLEBSIELLA PNEUMONIAE  Final      Susceptibility   Klebsiella pneumoniae - MIC*    AMPICILLIN >=32 RESISTANT Resistant     CEFAZOLIN <=4 SENSITIVE Sensitive     CEFEPIME <=0.12 SENSITIVE Sensitive     CEFTAZIDIME <=1 SENSITIVE Sensitive      CEFTRIAXONE <=0.25 SENSITIVE Sensitive     CIPROFLOXACIN <=0.25 SENSITIVE Sensitive     GENTAMICIN <=1 SENSITIVE Sensitive     IMIPENEM <=0.25 SENSITIVE Sensitive     TRIMETH/SULFA <=20 SENSITIVE Sensitive     AMPICILLIN/SULBACTAM 8 SENSITIVE Sensitive     PIP/TAZO <=4 SENSITIVE Sensitive     * KLEBSIELLA PNEUMONIAE  Blood Culture ID Panel (Reflexed)     Status: Abnormal   Collection Time: 12/28/20  9:45 PM  Result Value Ref Range Status   Enterococcus faecalis NOT DETECTED NOT DETECTED Final   Enterococcus Faecium NOT DETECTED NOT DETECTED Final   Listeria monocytogenes NOT DETECTED NOT DETECTED Final   Staphylococcus species NOT DETECTED NOT DETECTED Final   Staphylococcus aureus (BCID) NOT DETECTED NOT DETECTED Final   Staphylococcus epidermidis NOT DETECTED NOT DETECTED Final   Staphylococcus lugdunensis NOT DETECTED NOT DETECTED Final   Streptococcus species NOT DETECTED NOT DETECTED Final   Streptococcus agalactiae NOT DETECTED NOT DETECTED Final   Streptococcus pneumoniae NOT DETECTED NOT DETECTED Final   Streptococcus pyogenes NOT DETECTED NOT DETECTED Final   A.calcoaceticus-baumannii NOT DETECTED NOT DETECTED Final   Bacteroides fragilis NOT DETECTED NOT DETECTED Final   Enterobacterales DETECTED (A) NOT DETECTED Final    Comment: Enterobacterales represent a large order of gram negative bacteria, not a single organism. CRITICAL RESULT CALLED TO, READ BACK BY AND VERIFIED WITH: RN C.COBB ON [id number] AT 1322 BY E.PARRISH    Enterobacter cloacae complex NOT DETECTED NOT DETECTED Final   Escherichia coli NOT DETECTED NOT DETECTED Final   Klebsiella aerogenes NOT DETECTED NOT DETECTED Final   Klebsiella oxytoca NOT DETECTED NOT DETECTED Final   Klebsiella pneumoniae DETECTED (A) NOT DETECTED Final    Comment: CRITICAL RESULT CALLED TO, READ BACK BY AND VERIFIED WITH: RN C.COBB ON [id number] AT 1322 BY E.PARRISH    Proteus species NOT DETECTED NOT DETECTED Final   Salmonella  species NOT DETECTED NOT DETECTED Final   Serratia marcescens NOT DETECTED NOT DETECTED Final   Haemophilus influenzae NOT DETECTED NOT DETECTED Final   Neisseria meningitidis NOT DETECTED NOT DETECTED Final   Pseudomonas aeruginosa NOT DETECTED NOT DETECTED Final   Stenotrophomonas maltophilia NOT DETECTED NOT DETECTED Final   Candida albicans NOT DETECTED NOT DETECTED Final   Candida auris NOT DETECTED NOT DETECTED Final   Candida glabrata NOT DETECTED NOT DETECTED Final   Candida krusei NOT DETECTED NOT DETECTED Final   Candida parapsilosis NOT DETECTED NOT DETECTED Final   Candida tropicalis NOT DETECTED NOT DETECTED Final   Cryptococcus neoformans/gattii NOT DETECTED NOT DETECTED Final   CTX-M ESBL NOT DETECTED NOT DETECTED Final   Carbapenem resistance IMP NOT DETECTED NOT DETECTED Final   Carbapenem resistance KPC NOT DETECTED NOT DETECTED Final   Carbapenem resistance NDM NOT DETECTED NOT DETECTED Final   Carbapenem resist OXA 48 LIKE NOT DETECTED NOT DETECTED Final   Carbapenem resistance VIM NOT DETECTED NOT DETECTED Final    Comment: Performed at [facility], [address] [place], [place] [address]  Culture, blood (Routine x 2)     Status: Abnormal   Collection Time: 12/28/20 10:25 PM   Specimen: BLOOD  Result Value Ref Range Status   Specimen Description BLOOD LEFT HAND  Final   Special Requests   Final    BOTTLES DRAWN AEROBIC AND ANAEROBIC Blood Culture adequate volume   Culture  Setup Time   Final    GRAM NEGATIVE RODS AEROBIC BOTTLE ONLY GRAM POSITIVE COCCI IN CLUSTERS ANAEROBIC BOTTLE ONLY CRITICAL RESULT CALLED TO, READ BACK BY AND VERIFIED WITH: C PIERCE,PHARMD '@2209'$  12/29/20 MKELLY    Culture (A)  Final    KLEBSIELLA PNEUMONIAE SUSCEPTIBILITIES PERFORMED ON PREVIOUS CULTURE WITHIN THE LAST 5 DAYS. STAPHYLOCOCCUS COHNII THE SIGNIFICANCE OF ISOLATING THIS ORGANISM FROM A SINGLE SET OF BLOOD CULTURES WHEN MULTIPLE SETS ARE DRAWN IS UNCERTAIN. PLEASE  NOTIFY THE MICROBIOLOGY DEPARTMENT WITHIN ONE WEEK IF SPECIATION AND SENSITIVITIES ARE REQUIRED.  Performed at [facility], [place] [address], [place], [place] [address]    Report Status 01/01/2021 FINAL  Final  Blood Culture ID Panel (Reflexed)     Status: Abnormal   Collection Time: 12/28/20 10:25 PM  Result Value Ref Range Status   Enterococcus faecalis NOT DETECTED NOT DETECTED Final   Enterococcus Faecium NOT DETECTED NOT DETECTED Final   Listeria monocytogenes NOT DETECTED NOT DETECTED Final   Staphylococcus species DETECTED (A) NOT DETECTED Final    Comment: CRITICAL RESULT CALLED TO, READ BACK BY AND VERIFIED WITH: C PIERCE,PHARMD'@2209'$  12/29/20 MKELLY    Staphylococcus aureus (BCID) NOT DETECTED NOT DETECTED Final   Staphylococcus epidermidis NOT DETECTED NOT DETECTED Final   Staphylococcus lugdunensis NOT DETECTED NOT DETECTED Final   Streptococcus species NOT DETECTED NOT DETECTED Final   Streptococcus agalactiae NOT DETECTED NOT DETECTED Final   Streptococcus pneumoniae NOT DETECTED NOT DETECTED Final   Streptococcus pyogenes NOT DETECTED NOT DETECTED Final   A.calcoaceticus-baumannii NOT DETECTED NOT DETECTED Final   Bacteroides fragilis NOT DETECTED NOT DETECTED Final   Enterobacterales NOT DETECTED NOT DETECTED Final   Enterobacter cloacae complex NOT DETECTED NOT DETECTED Final   Escherichia coli NOT DETECTED NOT DETECTED Final   Klebsiella aerogenes NOT DETECTED NOT DETECTED Final   Klebsiella oxytoca NOT DETECTED NOT DETECTED Final   Klebsiella pneumoniae NOT DETECTED NOT DETECTED Final   Proteus species NOT DETECTED NOT DETECTED Final   Salmonella species NOT DETECTED NOT DETECTED Final   Serratia marcescens NOT DETECTED NOT DETECTED Final   Haemophilus influenzae NOT DETECTED NOT DETECTED Final   Neisseria meningitidis NOT DETECTED NOT DETECTED Final   Pseudomonas aeruginosa NOT DETECTED NOT DETECTED Final   Stenotrophomonas maltophilia NOT DETECTED NOT  DETECTED Final   Candida albicans NOT DETECTED NOT DETECTED Final   Candida auris NOT DETECTED NOT DETECTED Final   Candida glabrata NOT DETECTED NOT DETECTED Final   Candida krusei NOT DETECTED NOT DETECTED Final   Candida parapsilosis NOT DETECTED NOT DETECTED Final   Candida tropicalis NOT DETECTED NOT DETECTED Final   Cryptococcus neoformans/gattii NOT DETECTED NOT DETECTED Final    Comment: Performed at [facility], [address] [place], [place] [address]  Aerobic/Anaerobic Culture w Gram Stain (surgical/deep wound)     Status: None   Collection Time: 12/29/20 11:55 AM   Specimen: Gallbladder; Bile  Result Value Ref Range Status   Specimen Description GALL BLADDER  Final   Special Requests NONE  Final   Gram Stain   Final    FEW WBC PRESENT,BOTH PMN AND MONONUCLEAR FEW GRAM NEGATIVE RODS    Culture   Final    ABUNDANT KLEBSIELLA PNEUMONIAE NO ANAEROBES ISOLATED Performed at [facility], [address] [place], [place] [address]    Report Status 01/03/2021 FINAL  Final   Organism ID, Bacteria KLEBSIELLA PNEUMONIAE  Final      Susceptibility   Klebsiella pneumoniae - MIC*    AMPICILLIN >=32 RESISTANT Resistant     CEFAZOLIN <=4 SENSITIVE Sensitive     CEFEPIME <=0.12 SENSITIVE Sensitive     CEFTAZIDIME <=1 SENSITIVE Sensitive     CEFTRIAXONE <=0.25 SENSITIVE Sensitive     CIPROFLOXACIN <=0.25 SENSITIVE Sensitive     GENTAMICIN <=1 SENSITIVE Sensitive     IMIPENEM <=0.25 SENSITIVE Sensitive     TRIMETH/SULFA <=20 SENSITIVE Sensitive     AMPICILLIN/SULBACTAM 8 SENSITIVE Sensitive     PIP/TAZO <=4 SENSITIVE Sensitive     * ABUNDANT  KLEBSIELLA PNEUMONIAE  MRSA Next Gen by PCR, Nasal     Status: None   Collection Time: 12/30/20 11:08 AM   Specimen: Nasal Mucosa; Nasal Swab  Result Value Ref Range Status   MRSA by PCR Next Gen NOT DETECTED NOT DETECTED Final    Comment: (NOTE) The GeneXpert MRSA Assay (FDA approved for NASAL specimens only), is one  component of a comprehensive MRSA colonization surveillance program. It is not intended to diagnose MRSA infection nor to guide or monitor treatment for MRSA infections. Test performance is not FDA approved in patients less than 8 years old. Performed at [facility], [place] [address], [place], [place] [address]   Culture, blood (Routine X 2) w Reflex to ID Panel     Status: None   Collection Time: 12/31/20  6:41 AM   Specimen: BLOOD  Result Value Ref Range Status   Specimen Description BLOOD LEFT ANTECUBITAL  Final   Special Requests   Final    BOTTLES DRAWN AEROBIC AND ANAEROBIC Blood Culture adequate volume   Culture   Final    NO GROWTH 5 DAYS Performed at [facility], [place] [address], [place], [place] [address]    Report Status 01/05/2021 FINAL  Final  Culture, blood (Routine X 2) w Reflex to ID Panel     Status: None   Collection Time: 12/31/20  6:59 AM   Specimen: BLOOD LEFT FOREARM  Result Value Ref Range Status   Specimen Description BLOOD LEFT FOREARM  Final   Special Requests   Final    BOTTLES DRAWN AEROBIC AND ANAEROBIC Blood Culture adequate volume   Culture   Final    NO GROWTH 5 DAYS Performed at [facility], [place] [address] [place], [place] [address]    Report Status 01/05/2021 FINAL  Final     Labs: BNP (last 3 results) Recent Labs    01/16/20 1529 12/31/20 1449  BNP 51.5 [id number]*   Basic Metabolic Panel: Recent Labs  Lab 12/31/20 0641 01/01/21 0446 01/01/21 0723 01/02/21 0406 01/03/21 0445 01/04/21 0651 01/05/21 0142  NA 138 140 139 139 138  --  132*  K 4.1 3.1* 3.1* 3.2* 4.0  --  3.7  CL 99  --  100 99 100  --  96*  CO2 26  --  '28 30 28  '$ --  28  GLUCOSE 238*  --  208* 129* 145*  --  159*  BUN 35*  --  43* 40* 38*  --  27*  CREATININE 2.13*  --  2.08* 2.06* 1.88*  --  1.39*  CALCIUM 8.8*  --  8.7* 8.9 8.7*  --  8.6*  MG 1.8  --  2.8* 2.3 2.0 1.9  --   PHOS  --   --  3.2  --  3.1  --   --    Liver Function Tests: Recent  Labs  Lab 12/31/20 0641 01/01/21 0723 01/02/21 0406 01/03/21 0445 01/05/21 0142  AST 67* 98* 89* 60* 45*  ALT 28 38 41 35 27  ALKPHOS 212* 234* 267* 279* 316*  BILITOT 4.7* 3.7* 3.9* 4.1* 2.5*  PROT 6.2* 6.0* 6.3* 6.0* 5.9*  ALBUMIN 1.5* 1.6* 1.7* 1.7* 1.8*   No results for input(s): LIPASE, AMYLASE in the last 168 hours. No results for input(s): AMMONIA in the last 168 hours. CBC: Recent Labs  Lab 12/31/20 0641 01/01/21 0446 01/01/21 0723 01/02/21 0406 01/03/21 0221 01/04/21 0651  WBC 13.1*  --  11.5* 10.1 10.5 10.9*  NEUTROABS  --   --  8.9*  --   --   --   HGB 9.9* 10.5* 9.9* 9.8* 10.7* 9.9*  HCT 33.9* 31.0* 32.2* 32.4* 34.1* 33.1*  MCV 90.9  --  88.0 88.3 88.3 88.5  PLT 142*  --  153 149* 119* 149*   Cardiac Enzymes: No results for input(s): CKTOTAL, CKMB, CKMBINDEX, TROPONINI in the last 168 hours. BNP: Invalid input(s): POCBNP CBG: Recent Labs  Lab 01/05/21 1955 01/06/21 0053 01/06/21 0453 01/06/21 0807 01/06/21 1159  GLUCAP 165* 129* 101* 94 158*   D-Dimer No results for input(s): DDIMER in the last 72 hours. Hgb A1c No results for input(s): HGBA1C in the last 72 hours. Lipid Profile No results for input(s): CHOL, HDL, LDLCALC, TRIG, CHOLHDL, LDLDIRECT in the last 72 hours. Thyroid function studies No results for input(s): TSH, T4TOTAL, T3FREE, THYROIDAB in the last 72 hours.  Invalid input(s): FREET3 Anemia work up No results for input(s): VITAMINB12, FOLATE, FERRITIN, TIBC, IRON, RETICCTPCT in the last 72 hours. Urinalysis    Component Value Date/Time   COLORURINE AMBER (A) 12/29/2020 0318   APPEARANCEUR HAZY (A) 12/29/2020 0318   LABSPEC 1.033 (H) 12/29/2020 0318   PHURINE 5.0 12/29/2020 0318   GLUCOSEU 150 (A) 12/29/2020 0318   HGBUR SMALL (A) 12/29/2020 0318   BILIRUBINUR MODERATE (A) 12/29/2020 0318   KETONESUR 5 (A) 12/29/2020 0318   PROTEINUR 100 (A) 12/29/2020 0318   NITRITE POSITIVE (A) 12/29/2020 0318   LEUKOCYTESUR MODERATE (A)  12/29/2020 0318   Sepsis Labs Invalid input(s): PROCALCITONIN,  WBC,  LACTICIDVEN Microbiology Recent Results (from the past 240 hour(s))  Resp Panel by RT-PCR (Flu A&B, Covid) Nasopharyngeal Swab     Status: None   Collection Time: 12/28/20  9:42 PM   Specimen: Nasopharyngeal Swab; Nasopharyngeal(NP) swabs in vial transport medium  Result Value Ref Range Status   SARS Coronavirus 2 by RT PCR NEGATIVE NEGATIVE Final    Comment: (NOTE) SARS-CoV-2 target nucleic acids are NOT DETECTED.  The SARS-CoV-2 RNA is generally detectable in upper respiratory specimens during the acute phase of infection. The lowest concentration of SARS-CoV-2 viral copies this assay can detect is 138 copies/mL. A negative result does not preclude SARS-Cov-2 infection and should not be used as the sole basis for treatment or other patient management decisions. A negative result may occur with  improper specimen collection/handling, submission of specimen other than nasopharyngeal swab, presence of viral mutation(s) within the areas targeted by this assay, and inadequate number of viral copies(<138 copies/mL). A negative result must be combined with clinical observations, patient history, and epidemiological information. The expected result is Negative.  Fact Sheet for Patients:  EntrepreneurPulse.com.au  Fact Sheet for Healthcare Providers:  IncredibleEmployment.be  This test is no t yet approved or cleared by the [place] FDA and  has been authorized for detection and/or diagnosis of SARS-CoV-2 by FDA under an Emergency Use Authorization (EUA). This EUA will remain  in effect (meaning this test can be used) for the duration of the COVID-19 declaration under Section 564(b)(1) of the Act, 21 U.S.C.section 360bbb-3(b)(1), unless the authorization is terminated  or revoked sooner.       Influenza A by PCR NEGATIVE NEGATIVE Final   Influenza B by PCR NEGATIVE  NEGATIVE Final    Comment: (NOTE) The Xpert Xpress SARS-CoV-2/FLU/RSV plus assay is intended as an aid in the diagnosis of influenza from Nasopharyngeal swab specimens and should not be used as a sole  basis for treatment. Nasal washings and aspirates are unacceptable for Xpert Xpress SARS-CoV-2/FLU/RSV testing.  Fact Sheet for Patients: EntrepreneurPulse.com.au  Fact Sheet for Healthcare Providers: IncredibleEmployment.be  This test is not yet approved or cleared by the [place] FDA and has been authorized for detection and/or diagnosis of SARS-CoV-2 by FDA under an Emergency Use Authorization (EUA). This EUA will remain in effect (meaning this test can be used) for the duration of the COVID-19 declaration under Section 564(b)(1) of the Act, 21 U.S.C. section 360bbb-3(b)(1), unless the authorization is terminated or revoked.  Performed at [facility], [place] [address] [place], [place] [address]   Culture, blood (Routine x 2)     Status: Abnormal   Collection Time: 12/28/20  9:45 PM   Specimen: BLOOD  Result Value Ref Range Status   Specimen Description BLOOD RIGHT ANTECUBITAL  Final   Special Requests   Final    BOTTLES DRAWN AEROBIC AND ANAEROBIC Blood Culture adequate volume   Culture  Setup Time   Final    GRAM NEGATIVE RODS AEROBIC BOTTLE ONLY CRITICAL RESULT CALLED TO, READ BACK BY AND VERIFIED WITH: RN C.COBB ON [id number] AT [id number] BY [place] Performed at [facility], [place] [address] [place], [place] [address]    Culture KLEBSIELLA PNEUMONIAE (A)  Final   Report Status 12/31/2020 FINAL  Final   Organism ID, Bacteria KLEBSIELLA PNEUMONIAE  Final      Susceptibility   Klebsiella pneumoniae - MIC*    AMPICILLIN >=32 RESISTANT Resistant     CEFAZOLIN <=4 SENSITIVE Sensitive     CEFEPIME <=0.12 SENSITIVE Sensitive     CEFTAZIDIME <=1 SENSITIVE Sensitive     CEFTRIAXONE <=0.25 SENSITIVE Sensitive     CIPROFLOXACIN  <=0.25 SENSITIVE Sensitive     GENTAMICIN <=1 SENSITIVE Sensitive     IMIPENEM <=0.25 SENSITIVE Sensitive     TRIMETH/SULFA <=20 SENSITIVE Sensitive     AMPICILLIN/SULBACTAM 8 SENSITIVE Sensitive     PIP/TAZO <=4 SENSITIVE Sensitive     * KLEBSIELLA PNEUMONIAE  Blood Culture ID Panel (Reflexed)     Status: Abnormal   Collection Time: 12/28/20  9:45 PM  Result Value Ref Range Status   Enterococcus faecalis NOT DETECTED NOT DETECTED Final   Enterococcus Faecium NOT DETECTED NOT DETECTED Final   Listeria monocytogenes NOT DETECTED NOT DETECTED Final   Staphylococcus species NOT DETECTED NOT DETECTED Final   Staphylococcus aureus (BCID) NOT DETECTED NOT DETECTED Final   Staphylococcus epidermidis NOT DETECTED NOT DETECTED Final   Staphylococcus lugdunensis NOT DETECTED NOT DETECTED Final   Streptococcus species NOT DETECTED NOT DETECTED Final   Streptococcus agalactiae NOT DETECTED NOT DETECTED Final   Streptococcus pneumoniae NOT DETECTED NOT DETECTED Final   Streptococcus pyogenes NOT DETECTED NOT DETECTED Final   A.calcoaceticus-baumannii NOT DETECTED NOT DETECTED Final   Bacteroides fragilis NOT DETECTED NOT DETECTED Final   Enterobacterales DETECTED (A) NOT DETECTED Final    Comment: Enterobacterales represent a large order of gram negative bacteria, not a single organism. CRITICAL RESULT CALLED TO, READ BACK BY AND VERIFIED WITH: RN C.COBB ON [id number] AT 1322 BY E.PARRISH    Enterobacter cloacae complex NOT DETECTED NOT DETECTED Final   Escherichia coli NOT DETECTED NOT DETECTED Final   Klebsiella aerogenes NOT DETECTED NOT DETECTED Final   Klebsiella oxytoca NOT DETECTED NOT DETECTED Final   Klebsiella pneumoniae DETECTED (A) NOT DETECTED Final    Comment: CRITICAL RESULT CALLED TO, READ BACK BY AND VERIFIED WITH: RN C.COBB ON  [id number] AT [id number] BY E.PARRISH    Proteus species NOT DETECTED NOT DETECTED Final   Salmonella species NOT DETECTED NOT DETECTED Final   Serratia  marcescens NOT DETECTED NOT DETECTED Final   Haemophilus influenzae NOT DETECTED NOT DETECTED Final   Neisseria meningitidis NOT DETECTED NOT DETECTED Final   Pseudomonas aeruginosa NOT DETECTED NOT DETECTED Final   Stenotrophomonas maltophilia NOT DETECTED NOT DETECTED Final   Candida albicans NOT DETECTED NOT DETECTED Final   Candida auris NOT DETECTED NOT DETECTED Final   Candida glabrata NOT DETECTED NOT DETECTED Final   Candida krusei NOT DETECTED NOT DETECTED Final   Candida parapsilosis NOT DETECTED NOT DETECTED Final   Candida tropicalis NOT DETECTED NOT DETECTED Final   Cryptococcus neoformans/gattii NOT DETECTED NOT DETECTED Final   CTX-M ESBL NOT DETECTED NOT DETECTED Final   Carbapenem resistance IMP NOT DETECTED NOT DETECTED Final   Carbapenem resistance KPC NOT DETECTED NOT DETECTED Final   Carbapenem resistance NDM NOT DETECTED NOT DETECTED Final   Carbapenem resist OXA 48 LIKE NOT DETECTED NOT DETECTED Final   Carbapenem resistance VIM NOT DETECTED NOT DETECTED Final    Comment: Performed at [facility], [address], [place], [place] [address]  Culture, blood (Routine x 2)     Status: Abnormal   Collection Time: 12/28/20 10:25 PM   Specimen: BLOOD  Result Value Ref Range Status   Specimen Description BLOOD LEFT HAND  Final   Special Requests   Final    BOTTLES DRAWN AEROBIC AND ANAEROBIC Blood Culture adequate volume   Culture  Setup Time   Final    GRAM NEGATIVE RODS AEROBIC BOTTLE ONLY GRAM POSITIVE COCCI IN CLUSTERS ANAEROBIC BOTTLE ONLY CRITICAL RESULT CALLED TO, READ BACK BY AND VERIFIED WITH: C PIERCE,PHARMD '@2209'$  12/29/20 MKELLY    Culture (A)  Final    KLEBSIELLA PNEUMONIAE SUSCEPTIBILITIES PERFORMED ON PREVIOUS CULTURE WITHIN THE LAST 5 DAYS. STAPHYLOCOCCUS COHNII THE SIGNIFICANCE OF ISOLATING THIS ORGANISM FROM A SINGLE SET OF BLOOD CULTURES WHEN MULTIPLE SETS ARE DRAWN IS UNCERTAIN. PLEASE NOTIFY THE MICROBIOLOGY DEPARTMENT WITHIN ONE WEEK IF  SPECIATION AND SENSITIVITIES ARE REQUIRED. Performed at [facility], [place] [address], [place], [place] [address]    Report Status 01/01/2021 FINAL  Final  Blood Culture ID Panel (Reflexed)     Status: Abnormal   Collection Time: 12/28/20 10:25 PM  Result Value Ref Range Status   Enterococcus faecalis NOT DETECTED NOT DETECTED Final   Enterococcus Faecium NOT DETECTED NOT DETECTED Final   Listeria monocytogenes NOT DETECTED NOT DETECTED Final   Staphylococcus species DETECTED (A) NOT DETECTED Final    Comment: CRITICAL RESULT CALLED TO, READ BACK BY AND VERIFIED WITH: C PIERCE,PHARMD'@2209'$  12/29/20 MKELLY    Staphylococcus aureus (BCID) NOT DETECTED NOT DETECTED Final   Staphylococcus epidermidis NOT DETECTED NOT DETECTED Final   Staphylococcus lugdunensis NOT DETECTED NOT DETECTED Final   Streptococcus species NOT DETECTED NOT DETECTED Final   Streptococcus agalactiae NOT DETECTED NOT DETECTED Final   Streptococcus pneumoniae NOT DETECTED NOT DETECTED Final   Streptococcus pyogenes NOT DETECTED NOT DETECTED Final   A.calcoaceticus-baumannii NOT DETECTED NOT DETECTED Final   Bacteroides fragilis NOT DETECTED NOT DETECTED Final   Enterobacterales NOT DETECTED NOT DETECTED Final   Enterobacter cloacae complex NOT DETECTED NOT DETECTED Final   Escherichia coli NOT DETECTED NOT DETECTED Final   Klebsiella aerogenes NOT DETECTED NOT DETECTED Final   Klebsiella oxytoca NOT DETECTED NOT DETECTED Final   Klebsiella pneumoniae  NOT DETECTED NOT DETECTED Final   Proteus species NOT DETECTED NOT DETECTED Final   Salmonella species NOT DETECTED NOT DETECTED Final   Serratia marcescens NOT DETECTED NOT DETECTED Final   Haemophilus influenzae NOT DETECTED NOT DETECTED Final   Neisseria meningitidis NOT DETECTED NOT DETECTED Final   Pseudomonas aeruginosa NOT DETECTED NOT DETECTED Final   Stenotrophomonas maltophilia NOT DETECTED NOT DETECTED Final   Candida albicans NOT DETECTED NOT DETECTED  Final   Candida auris NOT DETECTED NOT DETECTED Final   Candida glabrata NOT DETECTED NOT DETECTED Final   Candida krusei NOT DETECTED NOT DETECTED Final   Candida parapsilosis NOT DETECTED NOT DETECTED Final   Candida tropicalis NOT DETECTED NOT DETECTED Final   Cryptococcus neoformans/gattii NOT DETECTED NOT DETECTED Final    Comment: Performed at [facility], [place] [address], [place], [place] [address]  Aerobic/Anaerobic Culture w Gram Stain (surgical/deep wound)     Status: None   Collection Time: 12/29/20 11:55 AM   Specimen: Gallbladder; Bile  Result Value Ref Range Status   Specimen Description GALL BLADDER  Final   Special Requests NONE  Final   Gram Stain   Final    FEW WBC PRESENT,BOTH PMN AND MONONUCLEAR FEW GRAM NEGATIVE RODS    Culture   Final    ABUNDANT KLEBSIELLA PNEUMONIAE NO ANAEROBES ISOLATED Performed at [facility], [address] [place], [place] [address]    Report Status 01/03/2021 FINAL  Final   Organism ID, Bacteria KLEBSIELLA PNEUMONIAE  Final      Susceptibility   Klebsiella pneumoniae - MIC*    AMPICILLIN >=32 RESISTANT Resistant     CEFAZOLIN <=4 SENSITIVE Sensitive     CEFEPIME <=0.12 SENSITIVE Sensitive     CEFTAZIDIME <=1 SENSITIVE Sensitive     CEFTRIAXONE <=0.25 SENSITIVE Sensitive     CIPROFLOXACIN <=0.25 SENSITIVE Sensitive     GENTAMICIN <=1 SENSITIVE Sensitive     IMIPENEM <=0.25 SENSITIVE Sensitive     TRIMETH/SULFA <=20 SENSITIVE Sensitive     AMPICILLIN/SULBACTAM 8 SENSITIVE Sensitive     PIP/TAZO <=4 SENSITIVE Sensitive     * ABUNDANT KLEBSIELLA PNEUMONIAE  MRSA Next Gen by PCR, Nasal     Status: None   Collection Time: 12/30/20 11:08 AM   Specimen: Nasal Mucosa; Nasal Swab  Result Value Ref Range Status   MRSA by PCR Next Gen NOT DETECTED NOT DETECTED Final    Comment: (NOTE) The GeneXpert MRSA Assay (FDA approved for NASAL specimens only), is one component of a comprehensive MRSA colonization  surveillance program. It is not intended to diagnose MRSA infection nor to guide or monitor treatment for MRSA infections. Test performance is not FDA approved in patients less than 45 years old. Performed at [facility], [place] [address], [place], [place] [address]   Culture, blood (Routine X 2) w Reflex to ID Panel     Status: None   Collection Time: 12/31/20  6:41 AM   Specimen: BLOOD  Result Value Ref Range Status   Specimen Description BLOOD LEFT ANTECUBITAL  Final   Special Requests   Final    BOTTLES DRAWN AEROBIC AND ANAEROBIC Blood Culture adequate volume   Culture   Final    NO GROWTH 5 DAYS Performed at [facility], [place] [address], [place], [place] [address]    Report Status 01/05/2021 FINAL  Final  Culture, blood (Routine X 2) w Reflex to ID Panel     Status: None  Collection Time: 12/31/20  6:59 AM   Specimen: BLOOD LEFT FOREARM  Result Value Ref Range Status   Specimen Description BLOOD LEFT FOREARM  Final   Special Requests   Final    BOTTLES DRAWN AEROBIC AND ANAEROBIC Blood Culture adequate volume   Culture   Final    NO GROWTH 5 DAYS Performed at [facility], [address] [place], [place] [address]    Report Status 01/05/2021 FINAL  Final     Time coordinating discharge: Over 30 minutes  SIGNED:   Hosie Poisson, MD  [facility] 01/06/2021, 3:31 PM Pager   If 7PM-7AM, please contact night-coverage [email or web address] Password [email or web address]

## 2021-01-06 NOTE — TOC Initial Note (Signed)
Transition of Care [facility]) - Initial/Assessment Note    Patient Details  Name: Matthew Gutierrez MRN: [id number] Date of Birth: 15-Nov-1955  Transition of Care [facility]) CM/SW Contact:    Bethann Berkshire, [place] Phone Number: 01/06/2021, 2:39 PM  Clinical Narrative:                  CSW notified that pt is medically stable to dc back to SNF. CSW called liaison with [facility]; they do not have a bed for pt and have no planned dc's for rest of the week. She informed CSW to check tomorrow.   CSW met with pt and informed him of situation with [facility]. Pt agreeable to SNF workup to find appropriate facilities in [place] area.   1440: Received call from liaison and informed that a bed has actually opened up. Pt has transferred to 6N. CSW updated 6N Education officer, museum.   Expected Discharge Plan: Skilled Nursing Facility Barriers to Discharge: No SNF bed   Patient Goals and CMS Choice        Expected Discharge Plan and Services Expected Discharge Plan: [place]       Living arrangements for the past 2 months: [place]                                      Prior Living Arrangements/Services Living arrangements for the past 2 months: [place] Lives with:: Facility Resident          Need for Family Participation in Patient Care: Yes (Comment) Care giver support system in place?: Yes (comment)      Activities of Daily Living Home Assistive Devices/Equipment: None ADL Screening (condition at time of admission) Patient's cognitive ability adequate to safely complete daily activities?: Yes Is the patient deaf or have difficulty hearing?: No Does the patient have difficulty seeing, even when wearing glasses/contacts?: No Does the patient have difficulty concentrating, remembering, or making decisions?: No Patient able to express need for assistance with ADLs?: Yes Does the patient have difficulty dressing or bathing?:  Yes Independently performs ADLs?: No Communication: Independent Dressing (OT): Dependent Is this a change from baseline?: Pre-admission baseline Grooming: Dependent Is this a change from baseline?: Pre-admission baseline Feeding: Needs assistance Is this a change from baseline?: Pre-admission baseline Bathing: Dependent Is this a change from baseline?: Pre-admission baseline Toileting: Dependent Is this a change from baseline?: Pre-admission baseline In/Out Bed: Dependent Is this a change from baseline?: Pre-admission baseline Walks in Home: Dependent Is this a change from baseline?: Pre-admission baseline Does the patient have difficulty walking or climbing stairs?: Yes Weakness of Legs: Both Weakness of Arms/Hands: Both  Permission Sought/Granted   Permission granted to share information with : Yes, Verbal Permission Granted  Share Information with NAME: Christoper, Bushey (Sister)   [phone] (Mobile)           Emotional Assessment Appearance:: Appears stated age Attitude/Demeanor/Rapport: Engaged Affect (typically observed): Flat Orientation: : Oriented to Self, Oriented to Place, Oriented to  Time, Oriented to Situation Alcohol / Substance Use: Not Applicable Psych Involvement: No (comment)  Admission diagnosis:  Acute cholecystitis [K81.0] Cholecystitis [K81.9] Hypoxia [R09.02] Abdominal pain [R10.9] Altered mental status, unspecified altered mental status type [R41.82] Sepsis, due to unspecified organism, unspecified whether acute organ dysfunction present [facility]) [A41.9] Patient Active Problem List   Diagnosis Date Noted   Acute respiratory distress  Abnormal magnetic resonance cholangiopancreatography (MRCP)    Calculus of bile duct with acute cholangitis with obstruction    Elevated LFTs    Acute cholecystitis 12/29/2020   Sepsis ([place]) 12/29/2020   Seizure disorder ([place]) 12/29/2020   Encephalopathy 12/29/2020   Choledocholithiasis    Schizoaffective  disorder ([place]) 03/14/2020   Impaired ambulation 03/14/2020   Epilepsy, grand mal ([place])    Frozen shoulder    Lumbago    Peripheral vascular disease ([place])    Stroke ([place])    Paroxysmal atrial fibrillation ([place]) 01/07/2020   Secondary hypercoagulable state ([place]) 01/07/2020   Cellulitis of leg 12/14/2019   Atrial fibrillation with RVR ([place]) 12/14/2019   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   Gout attack 10/06/2019   Urinary incontinence 07/25/2019   Shoulder bursitis 07/25/2019   Acute renal failure superimposed on stage 3 chronic kidney disease ([place]) 07/23/2019   Mixed hyperlipidemia 07/23/2019   Absence epileptic syndrome, not intractable, without status epilepticus ([place]) 07/23/2019   Atherosclerotic heart disease 07/23/2019   Diabetic neuropathy ([place]) 07/23/2019   Benign enlargement of prostate 07/23/2019   COPD (chronic obstructive pulmonary disease) ([place]) 07/23/2019   Long term (current) use of anticoagulants 07/23/2019   Morbid obesity ([place]) 07/23/2019   Chronic venous hypertension (idiopathic) with ulcer of left lower extremity ([place]) 07/23/2019   Obstructive sleep apnea 07/23/2019   Incontinence without sensory awareness 07/23/2019   Chronic respiratory failure ([place]) 07/23/2019   Body mass index 50.0-59.9, adult ([place]) 07/23/2019   H/O acute myocardial infarction 01/22/2018   Essential hypertension 01/22/2018   H/O: stroke 01/22/2018   History of DVT (deep vein thrombosis) 01/22/2018   Lymphedema of both lower extremities 01/22/2018   Peripheral vascular disease, unspecified ([place]) 07/12/2011   PCP:  Lillard Anes, MD Pharmacy:   [facility]) - [place], [place] [address] Phone: [phone] Fax: [place], [place], [place] [address] Phone:  [phone] Fax: [phone]     Social Determinants of Health (SDOH) Interventions    Readmission Risk Interventions No flowsheet data found.

## 2021-01-06 NOTE — TOC Transition Note (Addendum)
Transition of Care [facility]) - CM/SW Discharge Note   Patient Details  Name: Matthew Gutierrez MRN: [id number] Date of Birth: May 05, 1956  Transition of Care [facility]) CM/SW Contact:  Emeterio Reeve, LCSW Phone Number: 01/06/2021, 3:48 PM   Clinical Narrative:     Patient will DC to: [facility] Anticipated DC date: 01/06/21 Family notified: Rolene Arbour will notify family Transport by: Corey Harold     Per MD patient ready for DC to  [facility]. RN, patient, patient's family, and facility notified of DC. Discharge Summary and FL2 sent to facility. DC packet on chart. Ambulance transport requested for patient.    RN to call report to [phone], room 213.  4:47pm- Pt will not DC to facility today. MD will keep pt an additional night to get accurate Bipap settings. CSW cancelled ptar transportation.   Final next level of care: Skilled Nursing Facility Barriers to Discharge: Barriers Resolved   Patient Goals and CMS Choice        Discharge Placement              Patient [facility] bed at: [facility] Patient to be transferred to facility by: Ptar Name of family member notified: Rolene Arbour will notify family Patient and family notified of of transfer: 01/06/21  Discharge Plan and Services                                     Social Determinants of Health (SDOH) Interventions     Readmission Risk Interventions No flowsheet data found.   Emeterio Reeve, Latanya Presser, [place] Social Worker [phone]

## 2021-01-06 NOTE — Progress Notes (Addendum)
[place] for Warfarin Indication: Afib, history of DVT  Allergies  Allergen Reactions   Codeine Other (See Comments)    Unknown Reaction    Patient Measurements: Height: 6' (182.9 cm) Weight: 117 kg (257 lb 15 oz) IBW/kg (Calculated) : 77.6 Heparin dosing weight: 102.7 kg  Vital Signs: Temp: 97.7 F (36.5 C) (08/10 0808) Temp Source: Oral (08/10 0808) Pulse Rate: 54 (08/10 0647)  Labs: Recent Labs    01/04/21 0651 01/05/21 0142 01/06/21 0252  HGB 9.9*  --   --   HCT 33.1*  --   --   PLT 149*  --   --   LABPROT 15.3* 18.1* 27.6*  INR 1.2 1.5* 2.6*  HEPARINUNFRC 0.37 0.42 0.28*  CREATININE  --  1.39*  --      Estimated Creatinine Clearance: 69.1 mL/min (A) (by C-G formula based on SCr of 1.39 mg/dL (H)).   Assessment: 65 y/o M from SNF with hypoxemia. Unable to follow commands in the ED. Having some abdominal pain. On warfarin PTA for history of DVT and Afib.  Of note, RN reports that patient was not on anticoagulation on arrival to SNF and he has a fall risk; therefore, anticoagulation was not resumed.   Underwent cholecystostomy tube placement 8/2 and ERCP on 8/3 with choledocholithiasis found, sphincterotomy performed. Minor bleeding at sphincterotomy site treated with epinephrine.  IV heparin resumed post procedure and Coumadin added 01/03/21.   INR therapeutic 8/10 @ 2.6, up from 1.5 8/9. CBC hasn't been drawn in a couple of days so will reassess tomorrow (8/11). Patient has been receiving 200 mg Phenytoin daily which is an inducer and would theoretically lower warfarin dose, however the dose has remained constant and is likely not reflected in INR change.  Patient's LFTs have been elevated and are now trending down. Albumin trending up which may reduce future warfarin concentrations as warfarin is highly protein bound. No signs of bleeding noted per RN. Will plan on decreasing warfarin dose substantially today based on large INR  jump from 8/9.   Goal of Therapy:  INR 2-3 Monitor platelets by anticoagulation protocol: Yes   Plan:  STOP heparin  Decrease warfarin to 0.5 mg  Daily PT / INR and CBC  Adria Dill, PharmD PGY-1 Acute Care Resident  01/06/2021 9:09 AM

## 2021-01-07 HISTORY — PX: CYSTOSTOMY: SHX155

## 2021-01-07 LAB — GLUCOSE, CAPILLARY
Glucose-Capillary: 137 mg/dL — ABNORMAL HIGH (ref 70–99)
Glucose-Capillary: 139 mg/dL — ABNORMAL HIGH (ref 70–99)
Glucose-Capillary: 168 mg/dL — ABNORMAL HIGH (ref 70–99)
Glucose-Capillary: 172 mg/dL — ABNORMAL HIGH (ref 70–99)
Glucose-Capillary: 165 mg/dL — ABNORMAL HIGH (ref 70–99)

## 2021-01-07 LAB — CBC
HCT: 30.7 % — ABNORMAL LOW (ref 39.0–52.0)
Hemoglobin: 9.5 g/dL — ABNORMAL LOW (ref 13.0–17.0)
MCH: 27.5 pg (ref 26.0–34.0)
MCHC: 30.9 g/dL (ref 30.0–36.0)
MCV: 89 fL (ref 80.0–100.0)
Platelets: 174 10*3/uL (ref 150–400)
RBC: 3.45 MIL/uL — ABNORMAL LOW (ref 4.22–5.81)
RDW: 17.7 % — ABNORMAL HIGH (ref 11.5–15.5)
WBC: 14.7 10*3/uL — ABNORMAL HIGH (ref 4.0–10.5)
nRBC: 0 % (ref 0.0–0.2)

## 2021-01-07 LAB — PROTIME-INR
INR: 3.1 — ABNORMAL HIGH (ref 0.8–1.2)
Prothrombin Time: 32 seconds — ABNORMAL HIGH (ref 11.4–15.2)

## 2021-01-07 MED ORDER — WARFARIN SODIUM 1 MG PO TABS: 0.5000 mg | ORAL_TABLET | Freq: Every day | ORAL | Status: DC

## 2021-01-07 MED ORDER — INSULIN ASPART 100 UNIT/ML IJ SOLN
0.0000 [IU] | Freq: Three times a day (TID) | INTRAMUSCULAR | Status: DC
  Administered 2021-01-07 (×2): 2 [IU] via SUBCUTANEOUS

## 2021-01-07 MED ORDER — WARFARIN 0.5 MG HALF TABLET
0.5000 mg | ORAL_TABLET | Freq: Once | ORAL | Status: AC
  Administered 2021-01-07: 0.5 mg via ORAL
  Filled 2021-01-07: qty 1

## 2021-01-07 NOTE — Progress Notes (Signed)
Patient leaving facility via [facility] to return to [facility]. Patient is alert and verbal with vital signs noted to be stable at this time upon leaving. No distress noted.

## 2021-01-07 NOTE — Progress Notes (Signed)
Patient had 3 beats of vtach. He is asymptomatic, VSS. MD notified, will continue to monitor.

## 2021-01-07 NOTE — Discharge Summary (Addendum)
Physician Discharge Summary  Matthew Gutierrez [id number] DOB: [birth year] DOA: 12/28/2020  PCP: Lillard Anes, MD  Admit date: 12/28/2020 Discharge date: 01/08/2021  Admitted From: SNF.  Disposition:  SNF  Recommendations for Outpatient Follow-up:  Follow up with PCP in 1-2 weeks Please obtain BMP/CBC in one week Please follow up with IR for followup .  Please follow up with cardiology in 4 to 6 weeks.  Please follow up with pulmonology as needed.  Please check INR and dose coumadin to keep the INr between 2 to 3.   Discharge Condition:guarded.  CODE STATUS:full code Diet recommendation: Dysphagia 3 diet.   Brief/Interim Summary: Matthew Gutierrez is a 65 y.o. male  with medical history significant for DMT2, CKD 3, a-fib, COPD, neuropathy due to diabetes, history of stroke, OSA, PVD, who was brought to the hospital from nursing home because of altered mental status and hypoxia with oxygen saturation in the 80s.   He was admitted to the hospital for acute ascending cholangitis.  He was treated with empiric antibiotics.  Cholecystostomy tube was placed by IR on 12/29/2020.  He underwent ERCP on 12/30/2020.  Following the procedure, he developed atrial fibrillation with RVR and right lung atelectasis with mediastinal shift felt to be due to aspiration pneumonia and mucous plugging. He completed the course of antibiotics .     Discharge Diagnoses:  Principal Problem:   Acute cholecystitis Active Problems:   Essential hypertension   H/O: stroke   Acute renal failure superimposed on stage 3 chronic kidney disease (HCC)   COPD (chronic obstructive pulmonary disease) ([place])   Long term (current) use of anticoagulants   Obstructive sleep apnea   Paroxysmal atrial fibrillation (HCC)   Sepsis (HCC)   Seizure disorder (HCC)   Encephalopathy   Abnormal magnetic resonance cholangiopancreatography (MRCP)   Calculus of bile duct with acute cholangitis with obstruction   Elevated LFTs   Acute  respiratory distress   Sepsis secondary to ascending cholangitis and cholecystitis due to Klebsiella pneumoniae, aspiration pneumonia: S/p cholecystostomy tube placed on 12/29/2020.  S/p ERCP with biliary sphincterotomy on 12/30/2020.  Completed 7 days of antibiotics on 01/04/2021. IR recommends outpatient follow-up in 6 weeks for cholangiogram.   Acute on chronic hypoxic and hypercapnic respiratory failure:  Improved, . CXR showing improving aeration. PCCM consulted to continue with bipap on discharge , but pt has been refusing it. Continue 2 L/min oxygen via nasal cannula.   Acute toxic metabolic encephalopathy: Improved.  COPD, OSA: PCCM recommended  BIPAP at night but he has been refusing it.  Continue bronchodilators. He is currently requiring up to 2 lit of [place] oxygen.  Atrial fibrillation with RVR:  Continue warfarin and monitor INR INR is therapeutic.  CHA2DS2-VASc is 7. Appreciate cardiology recommendations.    Seizure disorder: Continue Dilantin and Depakote.  Corrected Dilantin level is okay.    CKD stage IIIb: Creatinine stable   Unstageable bilateral heel, left and right ankle decubitus ulcers (all present on admission): Continue local wound care.  Left upper extremity swelling: negative for DVT on venous duplex of the left upper extremity   Plan to discharge to SNF when INR is therapeutic.      Discharge Instructions  Discharge Instructions     Diet - low sodium heart healthy   Complete by: As directed    Discharge wound care:   Complete by: As directed    Clean BL legs and feet with soap and water, rinse and pat dry. Apply Santyl in a nickle  thick layer to all the wounds. Cover with a moistened 2 x 2 gauze then dry gauze and secure with heel foam dressing. This should be completed daily. Apply Sween (pink top in clean supply) moisturizing lotion to both legs.   Increase activity slowly   Complete by: As directed       Allergies as of 01/07/2021       Reactions    Codeine Other (See Comments)   Unknown Reaction        Medication List     STOP taking these medications    allopurinol 300 MG tablet Commonly known as: ZYLOPRIM   aspirin 325 MG tablet   carvedilol 3.125 MG tablet Commonly known as: COREG   diltiazem 60 MG tablet Commonly known as: CARDIZEM   isosorbide mononitrate 120 MG 24 hr tablet Commonly known as: IMDUR   metFORMIN 1000 MG tablet Commonly known as: GLUCOPHAGE   metoprolol succinate 25 MG 24 hr tablet Commonly known as: TOPROL-XL   potassium chloride 10 MEQ tablet Commonly known as: KLOR-CON   pravastatin 40 MG tablet Commonly known as: PRAVACHOL   torsemide 20 MG tablet Commonly known as: DEMADEX   Toviaz 4 MG Tb24 tablet Generic drug: fesoterodine   triamcinolone cream 0.1 % Commonly known as: KENALOG       TAKE these medications    albuterol (2.5 MG/3ML) 0.083% nebulizer solution Commonly known as: PROVENTIL Inhale 3 mLs (2.5 mg total) into the lungs every 6 (six) hours as needed for wheezing or shortness of breath.   atorvastatin 10 MG tablet Commonly known as: LIPITOR Take 10 mg by mouth daily.   divalproex 125 MG DR tablet Commonly known as: DEPAKOTE Take 125 mg by mouth 2 (two) times daily.   divalproex 500 MG DR tablet Commonly known as: DEPAKOTE Take 500 mg by mouth 2 (two) times daily.   DULoxetine 60 MG capsule Commonly known as: CYMBALTA Take 60 mg by mouth daily.   EPINEPHrine 0.3 mg/0.3 mL Soaj injection Commonly known as: EPI-PEN Inject 0.3 mg into the muscle as needed for anaphylaxis.   fluticasone 50 MCG/ACT nasal spray Commonly known as: FLONASE Place 1 spray into both nostrils daily. What changed:  when to take this reasons to take this   gabapentin 300 MG capsule Commonly known as: NEURONTIN Take 1 capsule (300 mg total) by mouth at bedtime.   glucose blood test strip 1 each by Other route as needed. Use as instructed   guaiFENesin 100 MG/5ML  Soln Commonly known as: ROBITUSSIN Take 5 mLs (100 mg total) by mouth 2 (two) times daily.   phenytoin 100 MG ER capsule Commonly known as: DILANTIN Take 1 capsule (100 mg total) by mouth 2 (two) times daily. What changed:  medication strength how much to take additional instructions   polyethylene glycol powder 17 GM/SCOOP powder Commonly known as: GLYCOLAX/MIRALAX Take 1 Container by mouth every morning.   risperiDONE 1 MG tablet Commonly known as: RISPERDAL Take 1 mg by mouth 2 (two) times daily.   Trulicity [id number] Sopn Generic drug: Dulaglutide Inject 0.75 mg into the skin every Tuesday.   Vitamin D3 1.25 MG (50000 UT) Tabs Take 50,000 Units by mouth 2 (two) times a week. Every Wed and Sat   warfarin 1 MG tablet Commonly known as: COUMADIN Take 0.5 tablets (0.5 mg total) by mouth daily at 4 PM. Please check INR tomorrow and dose coumadin as per the INR. Start taking on: January 08, 2021 What changed:  medication  strength how much to take when to take this additional instructions Another medication with the same name was removed. Continue taking this medication, and follow the directions you see here.               Discharge Care Instructions  (From admission, onward)           Start     Ordered   01/06/21 0000  Discharge wound care:       Comments: Clean BL legs and feet with soap and water, rinse and pat dry. Apply Santyl in a nickle thick layer to all the wounds. Cover with a moistened 2 x 2 gauze then dry gauze and secure with heel foam dressing. This should be completed daily. Apply Sween (pink top in clean supply) moisturizing lotion to both legs.   01/06/21 1628            Follow-up Information     Georganna Skeans, MD Follow up.   Specialty: General Surgery Why: follow up appointment on 01/20/21 at 11 am, please arrive at 10:30 am to complete check in process. Please bring photo ID and insurance card Contact information: [place] [id number] [phone]         [facility] .   Specialty: Radiology Contact information: [id number] [place] [phone]        Michaelle Birks, MD Follow up.   Specialties: Interventional Radiology, Diagnostic Radiology, Radiology Why: 6 week follow up (around 9/13) for cholangiogram and possible capping trial. Contact information: [address] [place] [address] [phone]                Allergies  Allergen Reactions   Codeine Other (See Comments)    Unknown Reaction    Consultations: Cardiology PCCM.    Procedures/Studies: CT HEAD WO CONTRAST (5MM)  Result Date: 12/28/2020 CLINICAL DATA:  Mental status change, unknown cause altered mental status. EXAM: CT HEAD WITHOUT CONTRAST TECHNIQUE: Contiguous axial images were obtained from the base of the skull through the vertex without intravenous contrast. COMPARISON:  None. FINDINGS: Brain: Normal anatomic configuration. There is moderate parenchymal volume loss, slightly advanced given the patient's age. Remote lacunar infarcts are noted within the left caudate nucleus, left insular cortex, and right thalamus. Periventricular white matter changes are present likely reflecting the sequela of small vessel ischemia. No abnormal intra or extra-axial mass lesion or fluid collection. No abnormal mass effect or midline shift. No evidence of acute intracranial hemorrhage or infarct. Ventricular size is normal. Cerebellum unremarkable. Vascular: No asymmetric hyperdense vasculature at the skull base. Skull: Intact Sinuses/Orbits: Paranasal sinuses are clear. Orbits are unremarkable. Other: Mastoid air cells and middle ear cavities are clear. IMPRESSION: No acute intracranial abnormality. Advanced senescent change. Multiple remote infarcts as noted above. Electronically Signed   By: Fidela Salisbury MD    On: 12/28/2020 23:45   CT ABDOMEN PELVIS W CONTRAST  Result Date: 12/28/2020 CLINICAL DATA:  Abdominal pain, biliary obstruction suspected (Ped 0-18y) EXAM: CT ABDOMEN AND PELVIS WITH CONTRAST TECHNIQUE: Multidetector CT imaging of the abdomen and pelvis was performed using the standard protocol following bolus administration of intravenous contrast. CONTRAST:  128m OMNIPAQUE IOHEXOL 300 MG/ML  SOLN COMPARISON:  04/11/2020 FINDINGS: Lower chest: Small right pleural effusion noted at the visualized right lung base with right basilar atelectasis. Mild left basilar atelectasis. Extensive multi-vessel coronary artery calcification with probable stenting of  the left anterior descending coronary artery. Cardiac size is mildly enlarged. No pericardial effusion. Hepatobiliary: The gallbladder is distended, the gallbladder wall is thickened, and there is extensive pericholecystic inflammatory change in keeping with changes of acute cholecystitis. The extrahepatic bile duct is not dilated. There is no intrahepatic biliary ductal dilation. The liver is unremarkable. Pancreas: Unremarkable Spleen: Unremarkable Adrenals/Urinary Tract: Adrenal glands are unremarkable. Kidneys are normal, without renal calculi, focal lesion, or hydronephrosis. Bladder is unremarkable. Stomach/Bowel: The stomach, small bowel, and large bowel are unremarkable. The appendix is not visualized and is likely absent. No free intraperitoneal gas or fluid. Vascular/Lymphatic: Mild aortoiliac atherosclerotic calcification. Advanced atherosclerotic calcification is seen within the visualized lower extremity arterial outflow. No aortic aneurysm. No pathologic adenopathy within the abdomen and pelvis. Reproductive: Prostate is unremarkable. Other: Tiny fat containing umbilical hernia.  Rectum unremarkable. Musculoskeletal: No acute bone abnormality. No lytic or blastic bone lesions are seen. IMPRESSION: Acute cholecystitis.  Surgical consultation is  advised. Extensive coronary artery calcification.  Mild global cardiomegaly. Aortic Atherosclerosis (ICD10-I70.0). Electronically Signed   By: Fidela Salisbury MD   On: 12/28/2020 23:50   MR ABDOMEN MRCP WO CONTRAST  Result Date: 12/29/2020 CLINICAL DATA:  Biliary obstruction, choledocholithiasis, altered mental status, acute cholecystitis, status post percutaneous cholecystostomy EXAM: MRI ABDOMEN WITHOUT CONTRAST  (INCLUDING MRCP) TECHNIQUE: Multiplanar multisequence MR imaging of the abdomen was performed. Heavily T2-weighted images of the biliary and pancreatic ducts were obtained, and three-dimensional MRCP images were rendered by post processing. COMPARISON:  CT abdomen pelvis, 12/28/2020, percutaneous cholecystostomy, 12/29/2020 FINDINGS: Lower chest: Small bilateral pleural effusions and associated atelectasis or consolidation. Cardiomegaly. Hepatobiliary: Mild hepatic steatosis. No mass or other parenchymal abnormality identified. The gallbladder is distended, with wall thickening and pericholecystic fluid, containing numerous tiny gallstones. Percutaneous cholecystostomy tube is in place, with formed pigtail in the gallbladder fundus. There is a small calculus in the distal common bile duct measuring 4 mm (series 8, image 27). No biliary ductal dilatation. Mild periportal edema. Pancreas: No mass, inflammatory changes, or other parenchymal abnormality identified. Spleen:  Splenomegaly, maximum coronal span 15.5 cm. Adrenals/Urinary Tract: No masses identified. No evidence of hydronephrosis. Stomach/Bowel: Visualized portions within the abdomen are unremarkable. Vascular/Lymphatic: No pathologically enlarged lymph nodes identified. No abdominal aortic aneurysm demonstrated. Other:  Small volume perihepatic ascites. Musculoskeletal: No suspicious bone lesions identified. IMPRESSION: 1. Percutaneous cholecystostomy tube is in place, with formed pigtail in the gallbladder fundus. 2. The gallbladder is  distended, with wall thickening and pericholecystic fluid, containing numerous tiny gallstones. Findings are consistent with acute cholecystitis. 3. There is a 4 mm calculus in the distal common bile duct within the pancreatic head. No biliary ductal dilatation. 4. Mild hepatic steatosis. 5. Splenomegaly. 6. Small volume perihepatic ascites. 7. Small bilateral pleural effusions and associated atelectasis or consolidation. 8. Cardiomegaly. Electronically Signed   By: Eddie Candle M.D.   On: 12/29/2020 13:40   IR Perc Cholecystostomy  Result Date: 12/29/2020 INDICATION: 65 year old male with acute cholecystitis EXAM: ULTRASOUND AND FLUOROSCOPIC-GUIDED CHOLECYSTOSTOMY TUBE PLACEMENT COMPARISON:  CT Abdomen Pelvis, 12/28/2020. MEDICATIONS: The patient is currently admitted to the hospital and on intravenous antibiotics. Antibiotics were administered within an appropriate time frame prior to skin puncture. ANESTHESIA/SEDATION: Moderate (conscious) sedation was employed during this procedure. A total of Versed 1.5 mg and Fentanyl 75 mcg was administered intravenously. Moderate Sedation Time: 26 minutes. The patient's level of consciousness and vital signs were monitored continuously by radiology nursing throughout the procedure under my direct supervision. CONTRAST:  13m OMNIPAQUE  IOHEXOL 300 MG/ML SOLN - administered into the gallbladder fossa. FLUOROSCOPY TIME:  1 minutes 0 seconds (9 mGy) COMPLICATIONS: None immediate. PROCEDURE: Informed written consent was obtained from the patient and/or patient's representative after a discussion of the risks, benefits and alternatives to treatment. Questions regarding the procedure were encouraged and answered. A timeout was performed prior to the initiation of the procedure. The right upper abdominal quadrant was prepped and draped in the usual sterile fashion, and a sterile drape was applied covering the operative field. Maximum barrier sterile technique with sterile gowns  and gloves were used for the procedure. A timeout was performed prior to the initiation of the procedure. Local anesthesia was provided with 1% lidocaine with epinephrine. Ultrasound scanning of the right upper quadrant demonstrates a markedly dilated gallbladder. Utilizing a transhepatic approach, a 22 gauge needle was advanced into the gallbladder under direct ultrasound guidance. An ultrasound image was saved for documentation purposes. Appropriate intraluminal puncture was confirmed with the efflux of bile and advancement of an 0.018 wire into the gallbladder lumen. The needle was exchanged for an [place] set. A small amount of contrast was injected to confirm appropriate intraluminal positioning. Over a [name] wire, a 8.2-French Cook cholecystomy tube was advanced into the gallbladder fossa, coiled and locked. Bile was aspirated and a small amount of contrast was injected as several post procedural spot radiographic images were obtained in various obliquities. The catheter was secured to the skin with suture, connected to a drainage bag and a dressing was placed. The patient tolerated the procedure well without immediate post procedural complication. IMPRESSION: Successful placement of a 10.2 French cholecystostomy tube, as above. Michaelle Birks, MD [facility] Electronically Signed   By: Michaelle Birks MD   On: 12/29/2020 16:50   DG CHEST PORT 1 VIEW  Result Date: 01/03/2021 CLINICAL DATA:  Acute respiratory failure EXAM: PORTABLE CHEST 1 VIEW COMPARISON:  01/02/2021 FINDINGS: Cardiac shadow is enlarged. Significant improved aeration in the right lung is noted with persistent basilar airspace opacity. Left lung demonstrates basilar atelectasis. No bony abnormality is seen. IMPRESSION: Significant improved aeration of the right lung as described. Electronically Signed   By: Inez Catalina M.D.   On: 01/03/2021 02:10   DG CHEST PORT 1 VIEW  Result  Date: 01/02/2021 CLINICAL DATA:  Acute respiratory failure with hypoxia EXAM: PORTABLE CHEST 1 VIEW COMPARISON:  01/02/2021 FINDINGS: Complete opacification of the right hemithorax. Left lower atelectasis or infiltrate. Heart is normal size. No acute bony abnormality. IMPRESSION: Complete opacification of the right hemithorax. Left base atelectasis or infiltrate. Electronically Signed   By: Rolm Baptise M.D.   On: 01/02/2021 17:30   DG CHEST PORT 1 VIEW  Result Date: 01/02/2021 CLINICAL DATA:  65 year old male with history of hypoxemia. EXAM: PORTABLE CHEST 1 VIEW COMPARISON:  Chest x-ray 01/01/2021. FINDINGS: Near complete opacification of the right hemithorax. Left basilar opacity which may reflect atelectasis and/or consolidation. Small left pleural effusion. No pneumothorax. Heart size is mildly enlarged. Upper mediastinal contours are distorted by patient positioning. IMPRESSION: 1. Dramatic worsening aeration in the right lung concerning for enlarging large right pleural effusion with worsening atelectasis and/or consolidation in the underlying lung. 2. Small amount of atelectasis and/or consolidation in the left lower lobe with small left pleural effusion. Electronically Signed   By: Vinnie Langton M.D.   On: 01/02/2021 08:31   DG Chest Port 1 View  Result Date: 01/01/2021 CLINICAL DATA:  Hypoxia. EXAM: PORTABLE CHEST 1 VIEW  COMPARISON:  A 08/2020 FINDINGS: Right lower lobe consolidation and right effusion unchanged. Mild left lower lobe atelectasis. Negative for heart failure or edema. IMPRESSION: Right lower lobe consolidation and right effusion unchanged. Electronically Signed   By: Franchot Gallo M.D.   On: 01/01/2021 08:05   DG CHEST PORT 1 VIEW  Result Date: 12/31/2020 CLINICAL DATA:  Hypoxia. EXAM: PORTABLE CHEST 1 VIEW COMPARISON:  12/31/2020. FINDINGS: Cardiomegaly. No pulmonary venous congestion. Interim decrease opacification right hemithorax with interim visualization of the right upper  lung. Findings consistent with partial resolution of large right pleural effusion. Large right pleural effusion remains. Underlying pulmonary disease cannot be excluded. No pneumothorax. IMPRESSION: 1. Findings consistent with interim partial resolution of large right pleural effusion. Large right pleural effusion remains. Underlying right lung disease cannot be excluded. 2.  Cardiomegaly.  No pulmonary venous congestion. Electronically Signed   By: Marcello Moores  Register   On: 12/31/2020 06:07   DG Chest Port 1 View  Result Date: 12/31/2020 CLINICAL DATA:  Acute respiratory distress. EXAM: PORTABLE CHEST 1 VIEW COMPARISON:  Chest radiograph dated 12/29/2020. FINDINGS: There is complete opacification of the right imaged thorax with slight shift of the mediastinum to the right of the midline suggestive of volume loss. This is new since the prior radiograph and may represent atelectasis, infiltrate, or combination of pleural effusion and associated atelectasis/infiltrate. Occlusion of the central airway is not excluded. Linear density at the left lung base as seen on the prior radiograph. No pneumothorax. No acute osseous pathology. IMPRESSION: Complete opacification of the right imaged thorax with slight shift of the mediastinum to the right of the midline suggestive of volume loss. Clinical correlation recommended. These results were called by telephone at the time of interpretation on 12/31/2020 at 2:10 am to nurse Riley Churches , who verbally acknowledged these results. Electronically Signed   By: Anner Crete M.D.   On: 12/31/2020 02:18   DG CHEST PORT 1 VIEW  Result Date: 12/29/2020 CLINICAL DATA:  Hypoxia EXAM: PORTABLE CHEST 1 VIEW COMPARISON:  Chest x-ray 12/28/2020, CT abdomen pelvis 12/28/2020 FINDINGS: Enlarged cardiac silhouette. The heart size and mediastinal contours are unchanged. Low lung volumes with bibasilar streaky airspace opacities likely representing atelectasis. Elevated right  hemidiaphragm no focal consolidation. No pulmonary edema. Trace right pleural effusion. No left pleural effusion. No pneumothorax. No acute osseous abnormality. IMPRESSION: 1. Trace right pleural effusion. 2. Low lung volumes. 3. Elevated right hemidiaphragm. Electronically Signed   By: Iven Finn M.D.   On: 12/29/2020 03:59   DG Chest Portable 1 View  Result Date: 12/28/2020 CLINICAL DATA:  Tachycardia. EXAM: PORTABLE CHEST 1 VIEW COMPARISON:  04/11/2020 FINDINGS: Low lung volumes persist. Stable heart size, likely accentuated by technique and low lung volumes. Unchanged mediastinal contours. Streaky bibasilar atelectasis or scarring. No pulmonary edema, pleural effusion or pneumothorax. Patient's chin partially obscures the right lung apex. No acute osseous abnormalities are seen. IMPRESSION: Low lung volumes with streaky bibasilar atelectasis or scarring. Electronically Signed   By: Keith Rake M.D.   On: 12/28/2020 22:58   DG ERCP  Result Date: 12/31/2020 CLINICAL DATA:  65 year old male undergoing ERCP for choledocholithiasis EXAM: ERCP TECHNIQUE: Multiple spot images obtained with the fluoroscopic device and submitted for interpretation post-procedure. FLUOROSCOPY TIME:  Fluoroscopy Time:  3 minutes 55 seconds Radiation Exposure Index (if provided by the fluoroscopic device): 182.4 Number of Acquired Spot Images: 0 COMPARISON:  MRCP 12/29/2020 FINDINGS: A total of 14 intraoperative saved images are submitted for review. The images  demonstrate a flexible duodenal scope in the descending duodenum. A percutaneous cholecystostomy tube is present. Subsequent images demonstrate cannulation of the common bile duct. Cholangiogram demonstrates a small filling defect in the distal common bile duct consistent with choledocholithiasis. The filling defect is no longer present on the final images consistent with removal. IMPRESSION: 1. Choledocholithiasis. 2. Percutaneous cholecystostomy tube in place. 3.  ERCP as above. These images were submitted for radiologic interpretation only. Please see the procedural report for the amount of contrast and the fluoroscopy time utilized. Electronically Signed   By: Jacqulynn Cadet M.D.   On: 12/31/2020 08:41   ECHOCARDIOGRAM COMPLETE  Result Date: 01/01/2021    ECHOCARDIOGRAM REPORT   Patient Name:   Matthew Gutierrez Date of Exam: 01/01/2021 Medical Rec #:  [id number]      Height:       72.0 in Accession #:    [id number]     Weight:       255.7 lb Date of Birth:  04-16-1956      BSA:          2.365 m Patient Age:    65 years       BP:           138/64 mmHg Patient Gender: M              HR:           64 bpm. Exam Location:  Inpatient Procedure: 2D Echo, Cardiac Doppler, Color Doppler and Intracardiac            Opacification Agent Indications:    I48.0 Paroxysmal atrial fibrillation  History:        Patient has prior history of Echocardiogram examinations, most                 recent 12/15/2019. COPD and Stroke; Risk Factors:Hypertension,                 Diabetes and Dyslipidemia. CKD.  Sonographer:    Jonelle Sidle Dance Referring Phys: [id number] Margie Billet IMPRESSIONS  1. Left ventricular ejection fraction, by estimation, is 60 to 65%. The left ventricle has normal function. The left ventricle has no regional wall motion abnormalities. Left ventricular diastolic parameters were normal.  2. Right ventricular systolic function is normal. The right ventricular size is normal.  3. The mitral valve is normal in structure. No evidence of mitral valve regurgitation.  4. The aortic valve is normal in structure. Aortic valve regurgitation is not visualized.  5. Aortic dilatation noted. There is borderline dilatation of the ascending aorta, measuring 39 mm. FINDINGS  Left Ventricle: Left ventricular ejection fraction, by estimation, is 60 to 65%. The left ventricle has normal function. The left ventricle has no regional wall motion abnormalities. Definity contrast agent was given IV to delineate  the left ventricular  endocardial borders. The left ventricular internal cavity size was normal in size. There is no left ventricular hypertrophy. Left ventricular diastolic parameters were normal. Right Ventricle: The right ventricular size is normal. Right vetricular wall thickness was not well visualized. Right ventricular systolic function is normal. Left Atrium: Left atrial size was normal in size. Right Atrium: Right atrial size was normal in size. Pericardium: There is no evidence of pericardial effusion. Mitral Valve: The mitral valve is normal in structure. No evidence of mitral valve regurgitation. Tricuspid Valve: The tricuspid valve is normal in structure. Tricuspid valve regurgitation is trivial. Aortic Valve: The aortic valve is normal in structure. Aortic valve regurgitation is  not visualized. Pulmonic Valve: The pulmonic valve was normal in structure. Pulmonic valve regurgitation is not visualized. Aorta: Aortic dilatation noted. There is borderline dilatation of the ascending aorta, measuring 39 mm. IAS/Shunts: The atrial septum is grossly normal.  LEFT VENTRICLE PLAX 2D LVIDd:         5.20 cm  Diastology LVIDs:         3.30 cm  LV e' medial:    5.33 cm/s LV PW:         1.50 cm  LV E/e' medial:  11.4 LV IVS:        1.30 cm  LV e' lateral:   8.27 cm/s LVOT diam:     2.10 cm  LV E/e' lateral: 7.4 LV SV:         46 LV SV Index:   19 LVOT Area:     3.46 cm  RIGHT VENTRICLE             IVC RV S prime:     14.50 cm/s  IVC diam: 1.90 cm TAPSE (M-mode): 2.3 cm LEFT ATRIUM           Index LA diam:      4.70 cm 1.99 cm/m LA Vol (A4C): 49.4 ml 20.89 ml/m  AORTIC VALVE LVOT Vmax:   65.30 cm/s LVOT Vmean:  41.900 cm/s LVOT VTI:    0.133 m  AORTA Ao Root diam: 3.80 cm Ao Asc diam:  3.90 cm MITRAL VALVE MV Area (PHT): 3.58 cm    SHUNTS MV Decel Time: 212 msec    Systemic VTI:  0.13 m MV E velocity: 60.80 cm/s  Systemic Diam: 2.10 cm MV A velocity: 72.00 cm/s MV E/A ratio:  0.84 Mertie Moores MD Electronically  signed by Mertie Moores MD Signature Date/Time: 01/01/2021/4:57:26 PM    Final    VAS [place] UPPER EXTREMITY VENOUS DUPLEX  Result Date: 01/05/2021 UPPER VENOUS STUDY  Patient Name:  Matthew Gutierrez  Date of Exam:   01/05/2021 Medical Rec #: [id number]       Accession #:    [id number] Date of Birth: Apr 20, 1956       Patient Gender: M Patient Age:   10 years Exam Location:  [facility] Procedure:      VAS [place] UPPER EXTREMITY VENOUS DUPLEX Referring Phys: Jennye Boroughs --------------------------------------------------------------------------------  Indications: Edema Limitations: Poor ultrasound/tissue interface, body habitus and restricted mobility. Comparison Study: No prior study Performing Technologist: Maudry Mayhew MHA, RDMS, RVT, RDCS  Examination Guidelines: A complete evaluation includes B-mode imaging, spectral Doppler, color Doppler, and power Doppler as needed of all accessible portions of each vessel. Bilateral testing is considered an integral part of a complete examination. Limited examinations for reoccurring indications may be performed as noted.  Right Findings: +----------+------------+---------+-----------+----------+-------+ RIGHT     CompressiblePhasicitySpontaneousPropertiesSummary +----------+------------+---------+-----------+----------+-------+ Subclavian               Yes       Yes                      +----------+------------+---------+-----------+----------+-------+  Left Findings: +----------+------------+---------+-----------+----------+--------------+ LEFT      CompressiblePhasicitySpontaneousProperties   Summary     +----------+------------+---------+-----------+----------+--------------+ IJV           Full       Yes       Yes                             +----------+------------+---------+-----------+----------+--------------+  Subclavian    Full       Yes       Yes                              +----------+------------+---------+-----------+----------+--------------+ Axillary      Full       Yes       Yes                             +----------+------------+---------+-----------+----------+--------------+ Brachial      Full       Yes       Yes                             +----------+------------+---------+-----------+----------+--------------+ Radial        Full                                                 +----------+------------+---------+-----------+----------+--------------+ Ulnar                                               Not visualized +----------+------------+---------+-----------+----------+--------------+ Cephalic      Full                                                 +----------+------------+---------+-----------+----------+--------------+ Basilic                                             Not visualized +----------+------------+---------+-----------+----------+--------------+  Summary:  Right: No evidence of thrombosis in the subclavian.  Left: No evidence of deep vein thrombosis in the upper extremity. No evidence of superficial vein thrombosis in the upper extremity. Limited due to technical limitations as described above.  *See table(s) above for measurements and observations.  Diagnosing physician: Ruta Hinds MD Electronically signed by Ruta Hinds MD on 01/05/2021 at 5:32:59 PM.    Final    US Abdomen Limited RUQ (LIVER/GB)  Result Date: 12/28/2020 CLINICAL DATA:  Abdominal pain. EXAM: ULTRASOUND ABDOMEN LIMITED RIGHT UPPER QUADRANT COMPARISON:  CT abdomen pelvis 04/11/2020 FINDINGS: Gallbladder: Abnormal gallbladder markedly limited evaluation - question of a heterogeneous structure distending the gallbladder. Common bile duct: Diameter: 4 mm Liver: Markedly limited evaluation. Unable to evaluate for focal hepatic lesion. Normal parenchymal echogenicity. Portal vein is patent on color Doppler imaging with normal direction of blood  flow towards the liver. Other: None. IMPRESSION: 1. Abnormal gallbladder with markedly limited/nondiagnostic evaluation - question of a heterogeneous structure distending the gallbladder. Recommend CT abdomen pelvis with intravenous contrast for further evaluation. 2. Markedly limited evaluation of the liver due to decreased penetration of the sonographic waves. Electronically Signed   By: Iven Finn M.D.   On: 12/28/2020 22:45      Subjective: Comfortable. No new complaints.   Discharge Exam: Vitals:   01/07/21 0419 01/07/21 0951  BP: (!) 135/99 120/69  Pulse: 62 68  Resp: 16 16  Temp: 97.8 F (36.6 C) 97.7 F (36.5 C)  SpO2: 97% 97%   Vitals:   01/06/21 2136 01/07/21 0419 01/07/21 0500 01/07/21 0951  BP: (!) 107/58 (!) 135/99  120/69  Pulse: (!) 57 62  68  Resp: '16 16  16  '$ Temp: 98.2 F (36.8 C) 97.8 F (36.6 C)  97.7 F (36.5 C)  TempSrc: Oral Oral  Oral  SpO2: 98% 97%  97%  Weight:   116 kg   Height:        General: Pt is alert, awake, not in acute distress Cardiovascular: RRR, S1/S2 +, no rubs, no gallops Respiratory: CTA bilaterally, no wheezing, no rhonchi Abdominal: Soft, NT, ND, bowel sounds + Extremities: no edema, no cyanosis    The results of significant diagnostics from this hospitalization (including imaging, microbiology, ancillary and laboratory) are listed below for reference.     Microbiology: Recent Results (from the past 240 hour(s))  Resp Panel by RT-PCR (Flu A&B, Covid) Nasopharyngeal Swab     Status: None   Collection Time: 12/28/20  9:42 PM   Specimen: Nasopharyngeal Swab; Nasopharyngeal(NP) swabs in vial transport medium  Result Value Ref Range Status   SARS Coronavirus 2 by RT PCR NEGATIVE NEGATIVE Final    Comment: (NOTE) SARS-CoV-2 target nucleic acids are NOT DETECTED.  The SARS-CoV-2 RNA is generally detectable in upper respiratory specimens during the acute phase of infection. The lowest concentration of SARS-CoV-2 viral  copies this assay can detect is 138 copies/mL. A negative result does not preclude SARS-Cov-2 infection and should not be used as the sole basis for treatment or other patient management decisions. A negative result may occur with  improper specimen collection/handling, submission of specimen other than nasopharyngeal swab, presence of viral mutation(s) within the areas targeted by this assay, and inadequate number of viral copies(<138 copies/mL). A negative result must be combined with clinical observations, patient history, and epidemiological information. The expected result is Negative.  Fact Sheet for Patients:  EntrepreneurPulse.com.au  Fact Sheet for Healthcare Providers:  IncredibleEmployment.be  This test is no t yet approved or cleared by the [place] FDA and  has been authorized for detection and/or diagnosis of SARS-CoV-2 by FDA under an Emergency Use Authorization (EUA). This EUA will remain  in effect (meaning this test can be used) for the duration of the COVID-19 declaration under Section 564(b)(1) of the Act, 21 U.S.C.section 360bbb-3(b)(1), unless the authorization is terminated  or revoked sooner.       Influenza A by PCR NEGATIVE NEGATIVE Final   Influenza B by PCR NEGATIVE NEGATIVE Final    Comment: (NOTE) The Xpert Xpress SARS-CoV-2/FLU/RSV plus assay is intended as an aid in the diagnosis of influenza from Nasopharyngeal swab specimens and should not be used as a sole basis for treatment. Nasal washings and aspirates are unacceptable for Xpert Xpress SARS-CoV-2/FLU/RSV testing.  Fact Sheet for Patients: EntrepreneurPulse.com.au  Fact Sheet for Healthcare Providers: IncredibleEmployment.be  This test is not yet approved or cleared by the [place] FDA and has been authorized for detection and/or diagnosis of SARS-CoV-2 by FDA under an Emergency Use Authorization (EUA). This  EUA will remain in effect (meaning this test can be used) for the duration of the COVID-19 declaration under Section 564(b)(1) of the Act, 21 U.S.C. section 360bbb-3(b)(1), unless the authorization is terminated or revoked.  Performed at [facility], [place] [address] [place], [place] [address]   Culture, blood (Routine x  2)     Status: Abnormal   Collection Time: 12/28/20  9:45 PM   Specimen: BLOOD  Result Value Ref Range Status   Specimen Description BLOOD RIGHT ANTECUBITAL  Final   Special Requests   Final    BOTTLES DRAWN AEROBIC AND ANAEROBIC Blood Culture adequate volume   Culture  Setup Time   Final    GRAM NEGATIVE RODS AEROBIC BOTTLE ONLY CRITICAL RESULT CALLED TO, READ BACK BY AND VERIFIED WITH: RN C.COBB ON [id number] AT [id number] BY [place] Performed at [facility], [place] [address], [place], [place] [address]    Culture KLEBSIELLA PNEUMONIAE (A)  Final   Report Status 12/31/2020 FINAL  Final   Organism ID, Bacteria KLEBSIELLA PNEUMONIAE  Final      Susceptibility   Klebsiella pneumoniae - MIC*    AMPICILLIN >=32 RESISTANT Resistant     CEFAZOLIN <=4 SENSITIVE Sensitive     CEFEPIME <=0.12 SENSITIVE Sensitive     CEFTAZIDIME <=1 SENSITIVE Sensitive     CEFTRIAXONE <=0.25 SENSITIVE Sensitive     CIPROFLOXACIN <=0.25 SENSITIVE Sensitive     GENTAMICIN <=1 SENSITIVE Sensitive     IMIPENEM <=0.25 SENSITIVE Sensitive     TRIMETH/SULFA <=20 SENSITIVE Sensitive     AMPICILLIN/SULBACTAM 8 SENSITIVE Sensitive     PIP/TAZO <=4 SENSITIVE Sensitive     * KLEBSIELLA PNEUMONIAE  Blood Culture ID Panel (Reflexed)     Status: Abnormal   Collection Time: 12/28/20  9:45 PM  Result Value Ref Range Status   Enterococcus faecalis NOT DETECTED NOT DETECTED Final   Enterococcus Faecium NOT DETECTED NOT DETECTED Final   Listeria monocytogenes NOT DETECTED NOT DETECTED Final   Staphylococcus species NOT DETECTED NOT DETECTED Final   Staphylococcus aureus (BCID) NOT DETECTED NOT  DETECTED Final   Staphylococcus epidermidis NOT DETECTED NOT DETECTED Final   Staphylococcus lugdunensis NOT DETECTED NOT DETECTED Final   Streptococcus species NOT DETECTED NOT DETECTED Final   Streptococcus agalactiae NOT DETECTED NOT DETECTED Final   Streptococcus pneumoniae NOT DETECTED NOT DETECTED Final   Streptococcus pyogenes NOT DETECTED NOT DETECTED Final   A.calcoaceticus-baumannii NOT DETECTED NOT DETECTED Final   Bacteroides fragilis NOT DETECTED NOT DETECTED Final   Enterobacterales DETECTED (A) NOT DETECTED Final    Comment: Enterobacterales represent a large order of gram negative bacteria, not a single organism. CRITICAL RESULT CALLED TO, READ BACK BY AND VERIFIED WITH: RN C.COBB ON [id number] AT 1322 BY E.PARRISH    Enterobacter cloacae complex NOT DETECTED NOT DETECTED Final   Escherichia coli NOT DETECTED NOT DETECTED Final   Klebsiella aerogenes NOT DETECTED NOT DETECTED Final   Klebsiella oxytoca NOT DETECTED NOT DETECTED Final   Klebsiella pneumoniae DETECTED (A) NOT DETECTED Final    Comment: CRITICAL RESULT CALLED TO, READ BACK BY AND VERIFIED WITH: RN C.COBB ON [id number] AT 1322 BY E.PARRISH    Proteus species NOT DETECTED NOT DETECTED Final   Salmonella species NOT DETECTED NOT DETECTED Final   Serratia marcescens NOT DETECTED NOT DETECTED Final   Haemophilus influenzae NOT DETECTED NOT DETECTED Final   Neisseria meningitidis NOT DETECTED NOT DETECTED Final   Pseudomonas aeruginosa NOT DETECTED NOT DETECTED Final   Stenotrophomonas maltophilia NOT DETECTED NOT DETECTED Final   Candida albicans NOT DETECTED NOT DETECTED Final   Candida auris NOT DETECTED NOT DETECTED Final   Candida glabrata NOT DETECTED NOT DETECTED Final   Candida krusei NOT DETECTED NOT DETECTED Final   Candida parapsilosis NOT DETECTED NOT DETECTED Final  Candida tropicalis NOT DETECTED NOT DETECTED Final   Cryptococcus neoformans/gattii NOT DETECTED NOT DETECTED Final   CTX-M ESBL NOT  DETECTED NOT DETECTED Final   Carbapenem resistance IMP NOT DETECTED NOT DETECTED Final   Carbapenem resistance KPC NOT DETECTED NOT DETECTED Final   Carbapenem resistance NDM NOT DETECTED NOT DETECTED Final   Carbapenem resist OXA 48 LIKE NOT DETECTED NOT DETECTED Final   Carbapenem resistance VIM NOT DETECTED NOT DETECTED Final    Comment: Performed at [facility], [place] [address] [place], [place] [address]  Culture, blood (Routine x 2)     Status: Abnormal   Collection Time: 12/28/20 10:25 PM   Specimen: BLOOD  Result Value Ref Range Status   Specimen Description BLOOD LEFT HAND  Final   Special Requests   Final    BOTTLES DRAWN AEROBIC AND ANAEROBIC Blood Culture adequate volume   Culture  Setup Time   Final    GRAM NEGATIVE RODS AEROBIC BOTTLE ONLY GRAM POSITIVE COCCI IN CLUSTERS ANAEROBIC BOTTLE ONLY CRITICAL RESULT CALLED TO, READ BACK BY AND VERIFIED WITH: C PIERCE,PHARMD '@2209'$  12/29/20 MKELLY    Culture (A)  Final    KLEBSIELLA PNEUMONIAE SUSCEPTIBILITIES PERFORMED ON PREVIOUS CULTURE WITHIN THE LAST 5 DAYS. STAPHYLOCOCCUS COHNII THE SIGNIFICANCE OF ISOLATING THIS ORGANISM FROM A SINGLE SET OF BLOOD CULTURES WHEN MULTIPLE SETS ARE DRAWN IS UNCERTAIN. PLEASE NOTIFY THE MICROBIOLOGY DEPARTMENT WITHIN ONE WEEK IF SPECIATION AND SENSITIVITIES ARE REQUIRED. Performed at [facility], [place] [address] [place], [place] [address]    Report Status 01/01/2021 FINAL  Final  Blood Culture ID Panel (Reflexed)     Status: Abnormal   Collection Time: 12/28/20 10:25 PM  Result Value Ref Range Status   Enterococcus faecalis NOT DETECTED NOT DETECTED Final   Enterococcus Faecium NOT DETECTED NOT DETECTED Final   Listeria monocytogenes NOT DETECTED NOT DETECTED Final   Staphylococcus species DETECTED (A) NOT DETECTED Final    Comment: CRITICAL RESULT CALLED TO, READ BACK BY AND VERIFIED WITH: C PIERCE,PHARMD'@2209'$  12/29/20 MKELLY    Staphylococcus aureus (BCID) NOT DETECTED  NOT DETECTED Final   Staphylococcus epidermidis NOT DETECTED NOT DETECTED Final   Staphylococcus lugdunensis NOT DETECTED NOT DETECTED Final   Streptococcus species NOT DETECTED NOT DETECTED Final   Streptococcus agalactiae NOT DETECTED NOT DETECTED Final   Streptococcus pneumoniae NOT DETECTED NOT DETECTED Final   Streptococcus pyogenes NOT DETECTED NOT DETECTED Final   A.calcoaceticus-baumannii NOT DETECTED NOT DETECTED Final   Bacteroides fragilis NOT DETECTED NOT DETECTED Final   Enterobacterales NOT DETECTED NOT DETECTED Final   Enterobacter cloacae complex NOT DETECTED NOT DETECTED Final   Escherichia coli NOT DETECTED NOT DETECTED Final   Klebsiella aerogenes NOT DETECTED NOT DETECTED Final   Klebsiella oxytoca NOT DETECTED NOT DETECTED Final   Klebsiella pneumoniae NOT DETECTED NOT DETECTED Final   Proteus species NOT DETECTED NOT DETECTED Final   Salmonella species NOT DETECTED NOT DETECTED Final   Serratia marcescens NOT DETECTED NOT DETECTED Final   Haemophilus influenzae NOT DETECTED NOT DETECTED Final   Neisseria meningitidis NOT DETECTED NOT DETECTED Final   Pseudomonas aeruginosa NOT DETECTED NOT DETECTED Final   Stenotrophomonas maltophilia NOT DETECTED NOT DETECTED Final   Candida albicans NOT DETECTED NOT DETECTED Final   Candida auris NOT DETECTED NOT DETECTED Final   Candida glabrata NOT DETECTED NOT DETECTED Final   Candida krusei NOT DETECTED NOT DETECTED Final   Candida parapsilosis NOT DETECTED NOT DETECTED Final   Candida  tropicalis NOT DETECTED NOT DETECTED Final   Cryptococcus neoformans/gattii NOT DETECTED NOT DETECTED Final    Comment: Performed at [facility], [place] [address] [place], [place] [address]  Aerobic/Anaerobic Culture w Gram Stain (surgical/deep wound)     Status: None   Collection Time: 12/29/20 11:55 AM   Specimen: Gallbladder; Bile  Result Value Ref Range Status   Specimen Description GALL BLADDER  Final   Special Requests NONE   Final   Gram Stain   Final    FEW WBC PRESENT,BOTH PMN AND MONONUCLEAR FEW GRAM NEGATIVE RODS    Culture   Final    ABUNDANT KLEBSIELLA PNEUMONIAE NO ANAEROBES ISOLATED Performed at [facility], [address], [place], [place] [address]    Report Status 01/03/2021 FINAL  Final   Organism ID, Bacteria KLEBSIELLA PNEUMONIAE  Final      Susceptibility   Klebsiella pneumoniae - MIC*    AMPICILLIN >=32 RESISTANT Resistant     CEFAZOLIN <=4 SENSITIVE Sensitive     CEFEPIME <=0.12 SENSITIVE Sensitive     CEFTAZIDIME <=1 SENSITIVE Sensitive     CEFTRIAXONE <=0.25 SENSITIVE Sensitive     CIPROFLOXACIN <=0.25 SENSITIVE Sensitive     GENTAMICIN <=1 SENSITIVE Sensitive     IMIPENEM <=0.25 SENSITIVE Sensitive     TRIMETH/SULFA <=20 SENSITIVE Sensitive     AMPICILLIN/SULBACTAM 8 SENSITIVE Sensitive     PIP/TAZO <=4 SENSITIVE Sensitive     * ABUNDANT KLEBSIELLA PNEUMONIAE  MRSA Next Gen by PCR, Nasal     Status: None   Collection Time: 12/30/20 11:08 AM   Specimen: Nasal Mucosa; Nasal Swab  Result Value Ref Range Status   MRSA by PCR Next Gen NOT DETECTED NOT DETECTED Final    Comment: (NOTE) The GeneXpert MRSA Assay (FDA approved for NASAL specimens only), is one component of a comprehensive MRSA colonization surveillance program. It is not intended to diagnose MRSA infection nor to guide or monitor treatment for MRSA infections. Test performance is not FDA approved in patients less than 2 years old. Performed at [facility], [place] [address], [place], [place] [address]   Culture, blood (Routine X 2) w Reflex to ID Panel     Status: None   Collection Time: 12/31/20  6:41 AM   Specimen: BLOOD  Result Value Ref Range Status   Specimen Description BLOOD LEFT ANTECUBITAL  Final   Special Requests   Final    BOTTLES DRAWN AEROBIC AND ANAEROBIC Blood Culture adequate volume   Culture   Final    NO GROWTH 5 DAYS Performed at [facility], [place] [address]  [place], [place] [address]    Report Status 01/05/2021 FINAL  Final  Culture, blood (Routine X 2) w Reflex to ID Panel     Status: None   Collection Time: 12/31/20  6:59 AM   Specimen: BLOOD LEFT FOREARM  Result Value Ref Range Status   Specimen Description BLOOD LEFT FOREARM  Final   Special Requests   Final    BOTTLES DRAWN AEROBIC AND ANAEROBIC Blood Culture adequate volume   Culture   Final    NO GROWTH 5 DAYS Performed at [facility], [place] [address], [place],  [address]    Report Status 01/05/2021 FINAL  Final  Resp Panel by RT-PCR (Flu A&B, Covid) Nasopharyngeal Swab     Status: None   Collection Time: 01/06/21  3:05 PM   Specimen: Nasopharyngeal Swab; Nasopharyngeal(NP) swabs in vial  transport medium  Result Value Ref Range Status   SARS Coronavirus 2 by RT PCR NEGATIVE NEGATIVE Final    Comment: (NOTE) SARS-CoV-2 target nucleic acids are NOT DETECTED.  The SARS-CoV-2 RNA is generally detectable in upper respiratory specimens during the acute phase of infection. The lowest concentration of SARS-CoV-2 viral copies this assay can detect is 138 copies/mL. A negative result does not preclude SARS-Cov-2 infection and should not be used as the sole basis for treatment or other patient management decisions. A negative result may occur with  improper specimen collection/handling, submission of specimen other than nasopharyngeal swab, presence of viral mutation(s) within the areas targeted by this assay, and inadequate number of viral copies(<138 copies/mL). A negative result must be combined with clinical observations, patient history, and epidemiological information. The expected result is Negative.  Fact Sheet for Patients:  EntrepreneurPulse.com.au  Fact Sheet for Healthcare Providers:  IncredibleEmployment.be  This test is no t yet approved or cleared by the [place] FDA and  has been authorized for detection and/or  diagnosis of SARS-CoV-2 by FDA under an Emergency Use Authorization (EUA). This EUA will remain  in effect (meaning this test can be used) for the duration of the COVID-19 declaration under Section 564(b)(1) of the Act, 21 U.S.C.section 360bbb-3(b)(1), unless the authorization is terminated  or revoked sooner.       Influenza A by PCR NEGATIVE NEGATIVE Final   Influenza B by PCR NEGATIVE NEGATIVE Final    Comment: (NOTE) The Xpert Xpress SARS-CoV-2/FLU/RSV plus assay is intended as an aid in the diagnosis of influenza from Nasopharyngeal swab specimens and should not be used as a sole basis for treatment. Nasal washings and aspirates are unacceptable for Xpert Xpress SARS-CoV-2/FLU/RSV testing.  Fact Sheet for Patients: EntrepreneurPulse.com.au  Fact Sheet for Healthcare Providers: IncredibleEmployment.be  This test is not yet approved or cleared by the [place] FDA and has been authorized for detection and/or diagnosis of SARS-CoV-2 by FDA under an Emergency Use Authorization (EUA). This EUA will remain in effect (meaning this test can be used) for the duration of the COVID-19 declaration under Section 564(b)(1) of the Act, 21 U.S.C. section 360bbb-3(b)(1), unless the authorization is terminated or revoked.  Performed at [facility], [place] [address] [place], [place] [address]      Labs: BNP (last 3 results) Recent Labs    01/16/20 1529 12/31/20 1449  BNP 51.5 [id number]*   Basic Metabolic Panel: Recent Labs  Lab 01/01/21 0446 01/01/21 0723 01/02/21 0406 01/03/21 0445 01/04/21 0651 01/05/21 0142  NA 140 139 139 138  --  132*  K 3.1* 3.1* 3.2* 4.0  --  3.7  CL  --  100 99 100  --  96*  CO2  --  '28 30 28  '$ --  28  GLUCOSE  --  208* 129* 145*  --  159*  BUN  --  43* 40* 38*  --  27*  CREATININE  --  2.08* 2.06* 1.88*  --  1.39*  CALCIUM  --  8.7* 8.9 8.7*  --  8.6*  MG  --  2.8* 2.3 2.0 1.9  --   PHOS  --  3.2  --   3.1  --   --    Liver Function Tests: Recent Labs  Lab 01/01/21 0723 01/02/21 0406 01/03/21 0445 01/05/21 0142  AST 98* 89* 60* 45*  ALT 38 41 35 27  ALKPHOS 234* 267* 279* 316*  BILITOT 3.7* 3.9* 4.1* 2.5*  PROT  6.0* 6.3* 6.0* 5.9*  ALBUMIN 1.6* 1.7* 1.7* 1.8*   No results for input(s): LIPASE, AMYLASE in the last 168 hours. No results for input(s): AMMONIA in the last 168 hours. CBC: Recent Labs  Lab 01/01/21 0723 01/02/21 0406 01/03/21 0221 01/04/21 0651 01/07/21 0119  WBC 11.5* 10.1 10.5 10.9* 14.7*  NEUTROABS 8.9*  --   --   --   --   HGB 9.9* 9.8* 10.7* 9.9* 9.5*  HCT 32.2* 32.4* 34.1* 33.1* 30.7*  MCV 88.0 88.3 88.3 88.5 89.0  PLT 153 149* 119* 149* 174   Cardiac Enzymes: No results for input(s): CKTOTAL, CKMB, CKMBINDEX, TROPONINI in the last 168 hours. BNP: Invalid input(s): POCBNP CBG: Recent Labs  Lab 01/06/21 2039 01/06/21 2343 01/07/21 0428 01/07/21 0836 01/07/21 1158  GLUCAP 194* 174* 137* 139* 168*   D-Dimer No results for input(s): DDIMER in the last 72 hours. Hgb A1c No results for input(s): HGBA1C in the last 72 hours. Lipid Profile No results for input(s): CHOL, HDL, LDLCALC, TRIG, CHOLHDL, LDLDIRECT in the last 72 hours. Thyroid function studies No results for input(s): TSH, T4TOTAL, T3FREE, THYROIDAB in the last 72 hours.  Invalid input(s): FREET3 Anemia work up No results for input(s): VITAMINB12, FOLATE, FERRITIN, TIBC, IRON, RETICCTPCT in the last 72 hours. Urinalysis    Component Value Date/Time   COLORURINE AMBER (A) 12/29/2020 0318   APPEARANCEUR HAZY (A) 12/29/2020 0318   LABSPEC 1.033 (H) 12/29/2020 0318   PHURINE 5.0 12/29/2020 0318   GLUCOSEU 150 (A) 12/29/2020 0318   HGBUR SMALL (A) 12/29/2020 0318   BILIRUBINUR MODERATE (A) 12/29/2020 0318   KETONESUR 5 (A) 12/29/2020 0318   PROTEINUR 100 (A) 12/29/2020 0318   NITRITE POSITIVE (A) 12/29/2020 0318   LEUKOCYTESUR MODERATE (A) 12/29/2020 0318   Sepsis  Labs Invalid input(s): PROCALCITONIN,  WBC,  LACTICIDVEN Microbiology Recent Results (from the past 240 hour(s))  Resp Panel by RT-PCR (Flu A&B, Covid) Nasopharyngeal Swab     Status: None   Collection Time: 12/28/20  9:42 PM   Specimen: Nasopharyngeal Swab; Nasopharyngeal(NP) swabs in vial transport medium  Result Value Ref Range Status   SARS Coronavirus 2 by RT PCR NEGATIVE NEGATIVE Final    Comment: (NOTE) SARS-CoV-2 target nucleic acids are NOT DETECTED.  The SARS-CoV-2 RNA is generally detectable in upper respiratory specimens during the acute phase of infection. The lowest concentration of SARS-CoV-2 viral copies this assay can detect is 138 copies/mL. A negative result does not preclude SARS-Cov-2 infection and should not be used as the sole basis for treatment or other patient management decisions. A negative result may occur with  improper specimen collection/handling, submission of specimen other than nasopharyngeal swab, presence of viral mutation(s) within the areas targeted by this assay, and inadequate number of viral copies(<138 copies/mL). A negative result must be combined with clinical observations, patient history, and epidemiological information. The expected result is Negative.  Fact Sheet for Patients:  EntrepreneurPulse.com.au  Fact Sheet for Healthcare Providers:  IncredibleEmployment.be  This test is no t yet approved or cleared by the [place] FDA and  has been authorized for detection and/or diagnosis of SARS-CoV-2 by FDA under an Emergency Use Authorization (EUA). This EUA will remain  in effect (meaning this test can be used) for the duration of the COVID-19 declaration under Section 564(b)(1) of the Act, 21 U.S.C.section 360bbb-3(b)(1), unless the authorization is terminated  or revoked sooner.       Influenza A by PCR NEGATIVE NEGATIVE Final   Influenza  B by PCR NEGATIVE NEGATIVE Final    Comment:  (NOTE) The Xpert Xpress SARS-CoV-2/FLU/RSV plus assay is intended as an aid in the diagnosis of influenza from Nasopharyngeal swab specimens and should not be used as a sole basis for treatment. Nasal washings and aspirates are unacceptable for Xpert Xpress SARS-CoV-2/FLU/RSV testing.  Fact Sheet for Patients: EntrepreneurPulse.com.au  Fact Sheet for Healthcare Providers: IncredibleEmployment.be  This test is not yet approved or cleared by the [place] FDA and has been authorized for detection and/or diagnosis of SARS-CoV-2 by FDA under an Emergency Use Authorization (EUA). This EUA will remain in effect (meaning this test can be used) for the duration of the COVID-19 declaration under Section 564(b)(1) of the Act, 21 U.S.C. section 360bbb-3(b)(1), unless the authorization is terminated or revoked.  Performed at [facility], [place] [address], [place], [place] [address]   Culture, blood (Routine x 2)     Status: Abnormal   Collection Time: 12/28/20  9:45 PM   Specimen: BLOOD  Result Value Ref Range Status   Specimen Description BLOOD RIGHT ANTECUBITAL  Final   Special Requests   Final    BOTTLES DRAWN AEROBIC AND ANAEROBIC Blood Culture adequate volume   Culture  Setup Time   Final    GRAM NEGATIVE RODS AEROBIC BOTTLE ONLY CRITICAL RESULT CALLED TO, READ BACK BY AND VERIFIED WITH: RN C.COBB ON [id number] AT [id number] BY Corinne Performed at [facility], [place] [address], [place], [place] [address]    Culture KLEBSIELLA PNEUMONIAE (A)  Final   Report Status 12/31/2020 FINAL  Final   Organism ID, Bacteria KLEBSIELLA PNEUMONIAE  Final      Susceptibility   Klebsiella pneumoniae - MIC*    AMPICILLIN >=32 RESISTANT Resistant     CEFAZOLIN <=4 SENSITIVE Sensitive     CEFEPIME <=0.12 SENSITIVE Sensitive     CEFTAZIDIME <=1 SENSITIVE Sensitive     CEFTRIAXONE <=0.25 SENSITIVE Sensitive     CIPROFLOXACIN <=0.25 SENSITIVE Sensitive      GENTAMICIN <=1 SENSITIVE Sensitive     IMIPENEM <=0.25 SENSITIVE Sensitive     TRIMETH/SULFA <=20 SENSITIVE Sensitive     AMPICILLIN/SULBACTAM 8 SENSITIVE Sensitive     PIP/TAZO <=4 SENSITIVE Sensitive     * KLEBSIELLA PNEUMONIAE  Blood Culture ID Panel (Reflexed)     Status: Abnormal   Collection Time: 12/28/20  9:45 PM  Result Value Ref Range Status   Enterococcus faecalis NOT DETECTED NOT DETECTED Final   Enterococcus Faecium NOT DETECTED NOT DETECTED Final   Listeria monocytogenes NOT DETECTED NOT DETECTED Final   Staphylococcus species NOT DETECTED NOT DETECTED Final   Staphylococcus aureus (BCID) NOT DETECTED NOT DETECTED Final   Staphylococcus epidermidis NOT DETECTED NOT DETECTED Final   Staphylococcus lugdunensis NOT DETECTED NOT DETECTED Final   Streptococcus species NOT DETECTED NOT DETECTED Final   Streptococcus agalactiae NOT DETECTED NOT DETECTED Final   Streptococcus pneumoniae NOT DETECTED NOT DETECTED Final   Streptococcus pyogenes NOT DETECTED NOT DETECTED Final   A.calcoaceticus-baumannii NOT DETECTED NOT DETECTED Final   Bacteroides fragilis NOT DETECTED NOT DETECTED Final   Enterobacterales DETECTED (A) NOT DETECTED Final    Comment: Enterobacterales represent a large order of gram negative bacteria, not a single organism. CRITICAL RESULT CALLED TO, READ BACK BY AND VERIFIED WITH: RN C.COBB ON [id number] AT 1322 BY E.PARRISH    Enterobacter cloacae complex NOT DETECTED NOT DETECTED Final   Escherichia coli NOT DETECTED NOT DETECTED Final   Klebsiella aerogenes  NOT DETECTED NOT DETECTED Final   Klebsiella oxytoca NOT DETECTED NOT DETECTED Final   Klebsiella pneumoniae DETECTED (A) NOT DETECTED Final    Comment: CRITICAL RESULT CALLED TO, READ BACK BY AND VERIFIED WITH: RN C.COBB ON [id number] AT 1322 BY E.PARRISH    Proteus species NOT DETECTED NOT DETECTED Final   Salmonella species NOT DETECTED NOT DETECTED Final   Serratia marcescens NOT DETECTED NOT DETECTED  Final   Haemophilus influenzae NOT DETECTED NOT DETECTED Final   Neisseria meningitidis NOT DETECTED NOT DETECTED Final   Pseudomonas aeruginosa NOT DETECTED NOT DETECTED Final   Stenotrophomonas maltophilia NOT DETECTED NOT DETECTED Final   Candida albicans NOT DETECTED NOT DETECTED Final   Candida auris NOT DETECTED NOT DETECTED Final   Candida glabrata NOT DETECTED NOT DETECTED Final   Candida krusei NOT DETECTED NOT DETECTED Final   Candida parapsilosis NOT DETECTED NOT DETECTED Final   Candida tropicalis NOT DETECTED NOT DETECTED Final   Cryptococcus neoformans/gattii NOT DETECTED NOT DETECTED Final   CTX-M ESBL NOT DETECTED NOT DETECTED Final   Carbapenem resistance IMP NOT DETECTED NOT DETECTED Final   Carbapenem resistance KPC NOT DETECTED NOT DETECTED Final   Carbapenem resistance NDM NOT DETECTED NOT DETECTED Final   Carbapenem resist OXA 48 LIKE NOT DETECTED NOT DETECTED Final   Carbapenem resistance VIM NOT DETECTED NOT DETECTED Final    Comment: Performed at [facility], [address], [place], [place] [address]  Culture, blood (Routine x 2)     Status: Abnormal   Collection Time: 12/28/20 10:25 PM   Specimen: BLOOD  Result Value Ref Range Status   Specimen Description BLOOD LEFT HAND  Final   Special Requests   Final    BOTTLES DRAWN AEROBIC AND ANAEROBIC Blood Culture adequate volume   Culture  Setup Time   Final    GRAM NEGATIVE RODS AEROBIC BOTTLE ONLY GRAM POSITIVE COCCI IN CLUSTERS ANAEROBIC BOTTLE ONLY CRITICAL RESULT CALLED TO, READ BACK BY AND VERIFIED WITH: C PIERCE,PHARMD '@2209'$  12/29/20 MKELLY    Culture (A)  Final    KLEBSIELLA PNEUMONIAE SUSCEPTIBILITIES PERFORMED ON PREVIOUS CULTURE WITHIN THE LAST 5 DAYS. STAPHYLOCOCCUS COHNII THE SIGNIFICANCE OF ISOLATING THIS ORGANISM FROM A SINGLE SET OF BLOOD CULTURES WHEN MULTIPLE SETS ARE DRAWN IS UNCERTAIN. PLEASE NOTIFY THE MICROBIOLOGY DEPARTMENT WITHIN ONE WEEK IF SPECIATION AND SENSITIVITIES ARE  REQUIRED. Performed at [facility], [place] [address] [place], [place] [address]    Report Status 01/01/2021 FINAL  Final  Blood Culture ID Panel (Reflexed)     Status: Abnormal   Collection Time: 12/28/20 10:25 PM  Result Value Ref Range Status   Enterococcus faecalis NOT DETECTED NOT DETECTED Final   Enterococcus Faecium NOT DETECTED NOT DETECTED Final   Listeria monocytogenes NOT DETECTED NOT DETECTED Final   Staphylococcus species DETECTED (A) NOT DETECTED Final    Comment: CRITICAL RESULT CALLED TO, READ BACK BY AND VERIFIED WITH: C PIERCE,PHARMD'@2209'$  12/29/20 MKELLY    Staphylococcus aureus (BCID) NOT DETECTED NOT DETECTED Final   Staphylococcus epidermidis NOT DETECTED NOT DETECTED Final   Staphylococcus lugdunensis NOT DETECTED NOT DETECTED Final   Streptococcus species NOT DETECTED NOT DETECTED Final   Streptococcus agalactiae NOT DETECTED NOT DETECTED Final   Streptococcus pneumoniae NOT DETECTED NOT DETECTED Final   Streptococcus pyogenes NOT DETECTED NOT DETECTED Final   A.calcoaceticus-baumannii NOT DETECTED NOT DETECTED Final   Bacteroides fragilis NOT DETECTED NOT DETECTED Final   Enterobacterales NOT DETECTED NOT DETECTED Final  Enterobacter cloacae complex NOT DETECTED NOT DETECTED Final   Escherichia coli NOT DETECTED NOT DETECTED Final   Klebsiella aerogenes NOT DETECTED NOT DETECTED Final   Klebsiella oxytoca NOT DETECTED NOT DETECTED Final   Klebsiella pneumoniae NOT DETECTED NOT DETECTED Final   Proteus species NOT DETECTED NOT DETECTED Final   Salmonella species NOT DETECTED NOT DETECTED Final   Serratia marcescens NOT DETECTED NOT DETECTED Final   Haemophilus influenzae NOT DETECTED NOT DETECTED Final   Neisseria meningitidis NOT DETECTED NOT DETECTED Final   Pseudomonas aeruginosa NOT DETECTED NOT DETECTED Final   Stenotrophomonas maltophilia NOT DETECTED NOT DETECTED Final   Candida albicans NOT DETECTED NOT DETECTED Final   Candida auris NOT  DETECTED NOT DETECTED Final   Candida glabrata NOT DETECTED NOT DETECTED Final   Candida krusei NOT DETECTED NOT DETECTED Final   Candida parapsilosis NOT DETECTED NOT DETECTED Final   Candida tropicalis NOT DETECTED NOT DETECTED Final   Cryptococcus neoformans/gattii NOT DETECTED NOT DETECTED Final    Comment: Performed at [facility], [place] [address] [place], [place] [address]  Aerobic/Anaerobic Culture w Gram Stain (surgical/deep wound)     Status: None   Collection Time: 12/29/20 11:55 AM   Specimen: Gallbladder; Bile  Result Value Ref Range Status   Specimen Description GALL BLADDER  Final   Special Requests NONE  Final   Gram Stain   Final    FEW WBC PRESENT,BOTH PMN AND MONONUCLEAR FEW GRAM NEGATIVE RODS    Culture   Final    ABUNDANT KLEBSIELLA PNEUMONIAE NO ANAEROBES ISOLATED Performed at [facility], [address] [place], [place] [address]    Report Status 01/03/2021 FINAL  Final   Organism ID, Bacteria KLEBSIELLA PNEUMONIAE  Final      Susceptibility   Klebsiella pneumoniae - MIC*    AMPICILLIN >=32 RESISTANT Resistant     CEFAZOLIN <=4 SENSITIVE Sensitive     CEFEPIME <=0.12 SENSITIVE Sensitive     CEFTAZIDIME <=1 SENSITIVE Sensitive     CEFTRIAXONE <=0.25 SENSITIVE Sensitive     CIPROFLOXACIN <=0.25 SENSITIVE Sensitive     GENTAMICIN <=1 SENSITIVE Sensitive     IMIPENEM <=0.25 SENSITIVE Sensitive     TRIMETH/SULFA <=20 SENSITIVE Sensitive     AMPICILLIN/SULBACTAM 8 SENSITIVE Sensitive     PIP/TAZO <=4 SENSITIVE Sensitive     * ABUNDANT KLEBSIELLA PNEUMONIAE  MRSA Next Gen by PCR, Nasal     Status: None   Collection Time: 12/30/20 11:08 AM   Specimen: Nasal Mucosa; Nasal Swab  Result Value Ref Range Status   MRSA by PCR Next Gen NOT DETECTED NOT DETECTED Final    Comment: (NOTE) The GeneXpert MRSA Assay (FDA approved for NASAL specimens only), is one component of a comprehensive MRSA colonization surveillance program. It is not intended to  diagnose MRSA infection nor to guide or monitor treatment for MRSA infections. Test performance is not FDA approved in patients less than 15 years old. Performed at [facility], [place] [address] [place], [place] [address]   Culture, blood (Routine X 2) w Reflex to ID Panel     Status: None   Collection Time: 12/31/20  6:41 AM   Specimen: BLOOD  Result Value Ref Range Status   Specimen Description BLOOD LEFT ANTECUBITAL  Final   Special Requests   Final    BOTTLES DRAWN AEROBIC AND ANAEROBIC Blood Culture adequate volume   Culture   Final    NO GROWTH 5 DAYS  Performed at [facility], [place] [address], [place], [place] [address]    Report Status 01/05/2021 FINAL  Final  Culture, blood (Routine X 2) w Reflex to ID Panel     Status: None   Collection Time: 12/31/20  6:59 AM   Specimen: BLOOD LEFT FOREARM  Result Value Ref Range Status   Specimen Description BLOOD LEFT FOREARM  Final   Special Requests   Final    BOTTLES DRAWN AEROBIC AND ANAEROBIC Blood Culture adequate volume   Culture   Final    NO GROWTH 5 DAYS Performed at [facility], [place] [address] [place], [place] [address]    Report Status 01/05/2021 FINAL  Final  Resp Panel by RT-PCR (Flu A&B, Covid) Nasopharyngeal Swab     Status: None   Collection Time: 01/06/21  3:05 PM   Specimen: Nasopharyngeal Swab; Nasopharyngeal(NP) swabs in vial transport medium  Result Value Ref Range Status   SARS Coronavirus 2 by RT PCR NEGATIVE NEGATIVE Final    Comment: (NOTE) SARS-CoV-2 target nucleic acids are NOT DETECTED.  The SARS-CoV-2 RNA is generally detectable in upper respiratory specimens during the acute phase of infection. The lowest concentration of SARS-CoV-2 viral copies this assay can detect is 138 copies/mL. A negative result does not preclude SARS-Cov-2 infection and should not be used as the sole basis for treatment or other patient management decisions. A negative result may occur with  improper  specimen collection/handling, submission of specimen other than nasopharyngeal swab, presence of viral mutation(s) within the areas targeted by this assay, and inadequate number of viral copies(<138 copies/mL). A negative result must be combined with clinical observations, patient history, and epidemiological information. The expected result is Negative.  Fact Sheet for Patients:  EntrepreneurPulse.com.au  Fact Sheet for Healthcare Providers:  IncredibleEmployment.be  This test is no t yet approved or cleared by the [place] FDA and  has been authorized for detection and/or diagnosis of SARS-CoV-2 by FDA under an Emergency Use Authorization (EUA). This EUA will remain  in effect (meaning this test can be used) for the duration of the COVID-19 declaration under Section 564(b)(1) of the Act, 21 U.S.C.section 360bbb-3(b)(1), unless the authorization is terminated  or revoked sooner.       Influenza A by PCR NEGATIVE NEGATIVE Final   Influenza B by PCR NEGATIVE NEGATIVE Final    Comment: (NOTE) The Xpert Xpress SARS-CoV-2/FLU/RSV plus assay is intended as an aid in the diagnosis of influenza from Nasopharyngeal swab specimens and should not be used as a sole basis for treatment. Nasal washings and aspirates are unacceptable for Xpert Xpress SARS-CoV-2/FLU/RSV testing.  Fact Sheet for Patients: EntrepreneurPulse.com.au  Fact Sheet for Healthcare Providers: IncredibleEmployment.be  This test is not yet approved or cleared by the [place] FDA and has been authorized for detection and/or diagnosis of SARS-CoV-2 by FDA under an Emergency Use Authorization (EUA). This EUA will remain in effect (meaning this test can be used) for the duration of the COVID-19 declaration under Section 564(b)(1) of the Act, 21 U.S.C. section 360bbb-3(b)(1), unless the authorization is terminated or revoked.  Performed at  [facility], [place] [address] [place], [place] [address]      Time coordinating discharge: Over 30 minutes  SIGNED:   Hosie Poisson, MD  [facility] 01/07/2021, 1:04 PM Pager   If 7PM-7AM, please contact night-coverage [email or web address] Password [email or web address]

## 2021-01-07 NOTE — Progress Notes (Signed)
[facility] for Warfarin Indication: Afib, history of DVT  Allergies  Allergen Reactions   Codeine Other (See Comments)    Unknown Reaction    Patient Measurements: Height: 6' (182.9 cm) Weight: 116 kg (255 lb 11.7 oz) IBW/kg (Calculated) : 77.6 Heparin dosing weight: 102.7 kg  Vital Signs: Temp: 97.7 F (36.5 C) (08/11 0951) Temp Source: Oral (08/11 0951) BP: 120/69 (08/11 0951) Pulse Rate: 68 (08/11 0951)  Labs: Recent Labs    01/05/21 0142 01/06/21 0252 01/07/21 0119  HGB  --   --  9.5*  HCT  --   --  30.7*  PLT  --   --  174  LABPROT 18.1* 27.6* 32.0*  INR 1.5* 2.6* 3.1*  HEPARINUNFRC 0.42 0.28*  --   CREATININE 1.39*  --   --      Estimated Creatinine Clearance: 68.8 mL/min (A) (by C-G formula based on SCr of 1.39 mg/dL (H)).   Assessment: 65 y/o M from SNF with hypoxemia. Unable to follow commands in the ED. Having some abdominal pain. On warfarin PTA for history of DVT and Afib.  Of note, RN reports that patient was not on anticoagulation on arrival to SNF and he has a fall risk; therefore, anticoagulation was not resumed.   Inr 3.1 today Cbc stable  Goal of Therapy:  INR 2-3 Monitor platelets by anticoagulation protocol: Yes   Plan:  Warfarin to 0.5 mg  Daily PT / INR and CBC  Barth Kirks, PharmD, BCCCP Clinical Pharmacist [phone]  Please check AMION for all [place] numbers  01/07/2021 11:24 AM

## 2021-01-07 NOTE — Care Management Important Message (Signed)
Important Message  Patient Details  Name: Matthew Gutierrez MRN: [id number] Date of Birth: [birth year]   Medicare Important Message Given:  Yes     Kimyetta Flott [address] 01/07/2021, 3:08 PM

## 2021-01-07 NOTE — Plan of Care (Signed)

## 2021-01-07 NOTE — Progress Notes (Addendum)
Attempting to give report but no answer yet.  06:30 Pm Report given to LPN in receiving facility.

## 2021-01-07 NOTE — TOC Transition Note (Signed)
Transition of Care [facility]) - CM/SW Discharge Note   Patient Details  Name: Matthew Gutierrez MRN: [id number] Date of Birth: Sep 29, 1955  [facility] CM/SW Contact:  Emeterio Reeve, LCSW Phone Number: 01/07/2021, 4:06 PM   Clinical Narrative:     Patient will DC to: [facility] Anticipated DC date: 01/07/21 Family notified: [facility] notified family Transport by: Corey Harold     Per MD patient ready for DC to [facility]. RN, patient, patient's family, and facility notified of DC. Discharge Summary and FL2 sent to facility. DC packet on chart. Ambulance transport requested for patient.    RN to call report to [phone], room 213.  CSW will sign off for now as social work intervention is no longer needed. Please consult [place] again if new needs arise.   Final next level of care: Skilled Nursing Facility Barriers to Discharge: Barriers Resolved   Patient Goals and CMS Choice        Discharge Placement              Patient chooses bed at: [facility] Patient to be transferred to facility by: [facility] Name of family member notified: Rolene Arbour will notify family Patient and family notified of of transfer: 01/06/21  Discharge Plan and Services                                     Social Determinants of Health (SDOH) Interventions     Readmission Risk Interventions No flowsheet data found.  Emeterio Reeve, Latanya Presser, [place] Social Worker [phone]

## 2021-01-08 ENCOUNTER — Other Ambulatory Visit: Payer: Self-pay | Admitting: General Surgery

## 2021-01-08 DIAGNOSIS — K8050 Calculus of bile duct without cholangitis or cholecystitis without obstruction: Secondary | ICD-10-CM

## 2021-01-13 ENCOUNTER — Encounter: Payer: Self-pay | Admitting: Cardiology

## 2021-01-25 ENCOUNTER — Ambulatory Visit: Payer: No Typology Code available for payment source | Admitting: Cardiology

## 2021-02-03 ENCOUNTER — Other Ambulatory Visit (HOSPITAL_COMMUNITY): Payer: Self-pay | Admitting: General Surgery

## 2021-02-03 DIAGNOSIS — K819 Cholecystitis, unspecified: Secondary | ICD-10-CM

## 2021-02-04 ENCOUNTER — Other Ambulatory Visit: Payer: Self-pay | Admitting: Radiology

## 2021-02-04 ENCOUNTER — Other Ambulatory Visit: Payer: Self-pay

## 2021-02-04 ENCOUNTER — Ambulatory Visit (HOSPITAL_COMMUNITY)
Admission: RE | Admit: 2021-02-04 | Discharge: 2021-02-04 | Disposition: A | Discharge status: Home / Self Care | Payer: Medicare Other | Source: Ambulatory Visit | Attending: General Surgery | Admitting: General Surgery

## 2021-02-04 DIAGNOSIS — Z87891 Personal history of nicotine dependence: Secondary | ICD-10-CM | POA: Insufficient documentation

## 2021-02-04 DIAGNOSIS — Z7901 Long term (current) use of anticoagulants: Secondary | ICD-10-CM | POA: Diagnosis not present

## 2021-02-04 DIAGNOSIS — K819 Cholecystitis, unspecified: Secondary | ICD-10-CM | POA: Diagnosis not present

## 2021-02-04 DIAGNOSIS — Z539 Procedure and treatment not carried out, unspecified reason: Secondary | ICD-10-CM | POA: Diagnosis not present

## 2021-02-04 DIAGNOSIS — Z79899 Other long term (current) drug therapy: Secondary | ICD-10-CM | POA: Diagnosis not present

## 2021-02-04 DIAGNOSIS — Z885 Allergy status to narcotic agent status: Secondary | ICD-10-CM | POA: Diagnosis not present

## 2021-02-04 LAB — PROTIME-INR
INR: 3.4 — ABNORMAL HIGH (ref 0.8–1.2)
Prothrombin Time: 34 seconds — ABNORMAL HIGH (ref 11.4–15.2)

## 2021-02-04 LAB — GLUCOSE, CAPILLARY: Glucose-Capillary: 97 mg/dL (ref 70–99)

## 2021-02-04 MED ORDER — SODIUM CHLORIDE 0.9 % IV SOLN: INTRAVENOUS | Status: DC

## 2021-02-04 MED ORDER — SODIUM CHLORIDE 0.9 % IV SOLN: 2.0000 g | INTRAVENOUS | Status: DC

## 2021-02-04 NOTE — H&P (Addendum)
Chief Complaint: Patient was seen in consultation today for replacement of biliary drain at the request of [place]   Supervising Physician: Ruthann Cancer  Patient Status: [facility] - Out-pt  History of Present Illness: Matthew Gutierrez is a 65 y.o. male from SNF who is known to IR.  He underwent a percutaneous cholecystostomy with [place] on 12/29/20.  Reportedly, at SNF the tube was inadvertently dislodged and removed during turning by staff.  He presents today NPO and prepared for procedure.  He is accompanied by a caregiver.  Case reviewed and approved by interventionalist, Dr. Pascal Lux.  Past Medical History:  Diagnosis Date   Absence epileptic syndrome, not intractable, without status epilepticus ([place]) 07/23/2019   Acute renal failure superimposed on stage 3 chronic kidney disease ([place]) 07/23/2019   Anemia, unspecified    Atherosclerotic heart disease 07/23/2019   Atrial fibrillation with RVR ([place]) 12/14/2019   Benign enlargement of prostate 07/23/2019   Body mass index 50.0-59.9, adult ([place]) 07/23/2019   Cellulitis of leg 12/14/2019   Chronic respiratory failure with hypoxia ([place]) 07/23/2019   Chronic venous hypertension (idiopathic) with ulcer of left lower extremity ([place]) 07/23/2019   COPD (chronic obstructive pulmonary disease) ([place]) 07/23/2019   Diabetic glomerulopathy ([place]) 10/15/2019   Diabetic neuropathy ([place]) 07/23/2019   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[place]) 10/15/2019   Epilepsy, grand mal ([place])    Essential hypertension 01/22/2018   Gout attack 10/06/2019   H/O: stroke 01/22/2018   History of DVT (deep vein thrombosis) 01/22/2018   Long term (current) use of anticoagulants 07/23/2019   Lumbago    Lymphedema of both lower extremities 01/22/2018   Mixed hyperlipidemia 07/23/2019   Morbid obesity ([place]) 07/23/2019   Obstructive sleep apnea 07/23/2019   Paroxysmal atrial fibrillation ([place]) 01/07/2020   Peripheral vascular disease, unspecified ([place]) 07/12/2011   Secondary  hypercoagulable state ([place]) 01/07/2020   Shoulder bursitis 07/25/2019   Stroke [facility])     Past Surgical History:  Procedure Laterality Date   CYSTOSTOMY  01/07/2021   ERCP N/A 12/30/2020   Procedure: ENDOSCOPIC RETROGRADE CHOLANGIOPANCREATOGRAPHY (ERCP);  Surgeon: Ladene Artist, MD;  Location: [facility] ENDOSCOPY;  Service: Endoscopy;  Laterality: N/A;   IR PERC CHOLECYSTOSTOMY  12/29/2020   REMOVAL OF STONES  12/30/2020   Procedure: REMOVAL OF STONES;  Surgeon: Ladene Artist, MD;  Location: [place];  Service: Endoscopy;;   SPHINCTEROTOMY  12/30/2020   Procedure: Joan Mayans;  Surgeon: Ladene Artist, MD;  Location: [facility] ENDOSCOPY;  Service: Endoscopy;;    Allergies: Codeine  Medications: Prior to Admission medications   Medication Sig Start Date End Date Taking? Authorizing Provider  albuterol (PROVENTIL) (2.5 MG/3ML) 0.083% nebulizer solution Inhale 3 mLs (2.5 mg total) into the lungs every 6 (six) hours as needed for wheezing or shortness of breath. 03/18/20  Yes Lillard Anes, MD  atorvastatin (LIPITOR) 10 MG tablet Take 10 mg by mouth daily.   Yes [provider]  Cholecalciferol (VITAMIN D3) 1.25 MG (50000 UT) TABS Take 50,000 Units by mouth 2 (two) times a week. Every Wed and Sat   Yes [provider]  divalproex (DEPAKOTE) 125 MG DR tablet Take 125 mg by mouth 2 (two) times daily.   Yes [provider]  divalproex (DEPAKOTE) 500 MG DR tablet Take 500 mg by mouth 2 (two) times daily.   Yes [provider]  DULoxetine (CYMBALTA) 60 MG capsule Take 60 mg by mouth daily.   Yes [provider]  fluticasone (FLONASE) 50 MCG/ACT nasal spray Place 1  spray into both nostrils daily. 10/30/19  Yes Lillard Anes, MD  gabapentin (NEURONTIN) 300 MG capsule Take 1 capsule (300 mg total) by mouth at bedtime. 11/18/19  Yes Lillard Anes, MD  polyethylene glycol powder [facility]) 17 GM/SCOOP powder Take 1  Container by mouth every morning.   Yes [provider]  risperiDONE (RISPERDAL) 1 MG tablet Take 1 mg by mouth 2 (two) times daily.   Yes [provider]  warfarin (COUMADIN) 1 MG tablet Take 0.5 tablets (0.5 mg total) by mouth daily at 4 PM. Please check INR tomorrow and dose coumadin as per the INR. 01/08/21  Yes Hosie Poisson, MD  Dulaglutide (TRULICITY) [id number] SOPN Inject 0.75 mg into the skin every Tuesday.    [provider]  EPINEPHrine 0.3 mg/0.3 mL IJ SOAJ injection Inject 0.3 mg into the muscle as needed for anaphylaxis. 03/18/20   Lillard Anes, MD  glucose blood test strip 1 each by Other route as needed. Use as instructed 03/17/20   Lillard Anes, MD  guaiFENesin (ROBITUSSIN) 100 MG/5ML SOLN Take 5 mLs by mouth 2 (two) times daily.    [provider]  phenytoin (DILANTIN) 100 MG ER capsule Take 1 capsule (100 mg total) by mouth 2 (two) times daily. 01/06/21   Hosie Poisson, MD     Family History  Problem Relation Age of Onset   Cancer Mother    Stroke Father    Hypertension Other    Stroke Other    Hyperlipidemia Other    Alcohol abuse Other    Heart disease Other    Cancer Other     Social History   Socioeconomic History   Marital status: Divorced    Spouse name: Not on file   Number of children: 2   Years of education: Not on file   Highest education level: Not on file  Occupational History   Occupation: Disability  Tobacco Use   Smoking status: Former    Types: Cigarettes    Quit date: 10/28/2009    Years since quitting: 11.2   Smokeless tobacco: Never  Substance and Sexual Activity   Alcohol use: Not Currently    Comment: occassionally   Drug use: No   Sexual activity: Not Currently  Other Topics Concern   Not on file  Social History Narrative   Not on file   Social Determinants of Health   Financial Resource Strain: Not on file  Food Insecurity: Not on file  Transportation Needs: Not on  file  Physical Activity: Not on file  Stress: Not on file  Social Connections: Not on file    Review of Systems: A 12 point ROS discussed and pertinent positives are indicated in the HPI above.  All other systems are negative.  Review of Systems  Constitutional: Negative.   Respiratory: Negative.    Cardiovascular:  Positive for leg swelling.  Neurological:  Positive for tremors and weakness.  Psychiatric/Behavioral:  Negative for behavioral problems and confusion. The patient is nervous/anxious.    Vital Signs: Temp 98.1 F (36.7 C) (Oral)   Resp 14   SpO2 99%   Physical Exam Vitals reviewed.  HENT:     Head: Normocephalic and atraumatic.  Cardiovascular:     Rate and Rhythm: Normal rate and regular rhythm.     Heart sounds: Normal heart sounds.  Pulmonary:     Effort: Pulmonary effort is normal. No respiratory distress.     Breath sounds: Normal breath sounds. No  wheezing.  Abdominal:     General: Abdomen is flat.     Palpations: Abdomen is soft.     Tenderness: There is no abdominal tenderness. There is no guarding.  Musculoskeletal:     Right lower leg: Edema present.     Left lower leg: Edema present.     Comments: 3+ pitting edema of upper left extremity.  Distal pulse difficult to palpate due to baseline tremor.  Skin:    General: Skin is dry.     Capillary Refill: Capillary refill takes more than 3 seconds.     Coloration: Skin is pale.     Findings: Bruising present.  Neurological:     General: No focal deficit present.     Mental Status: He is alert and oriented to person, place, and time.  Psychiatric:        Mood and Affect: Mood normal.        Behavior: Behavior normal.        Thought Content: Thought content normal.        Judgment: Judgment normal.    Imaging: VAS [place] UPPER EXTREMITY VENOUS DUPLEX  Result Date: 01/05/2021 UPPER VENOUS STUDY  Patient Name:  JACOBSON MILAN  Date of Exam:   01/05/2021 Medical Rec #: [id number]       Accession #:     [id number] Date of Birth: [birth year]       Patient Gender: M Patient Age:   72 years Exam Location:  [facility] Procedure:      VAS [place] UPPER EXTREMITY VENOUS DUPLEX Referring Phys: Jennye Boroughs --------------------------------------------------------------------------------  Indications: Edema Limitations: Poor ultrasound/tissue interface, body habitus and restricted mobility. Comparison Study: No prior study Performing Technologist: Maudry Mayhew MHA, RDMS, RVT, RDCS  Examination Guidelines: A complete evaluation includes B-mode imaging, spectral Doppler, color Doppler, and power Doppler as needed of all accessible portions of each vessel. Bilateral testing is considered an integral part of a complete examination. Limited examinations for reoccurring indications may be performed as noted.  Right Findings: +----------+------------+---------+-----------+----------+-------+ RIGHT     CompressiblePhasicitySpontaneousPropertiesSummary +----------+------------+---------+-----------+----------+-------+ Subclavian               Yes       Yes                      +----------+------------+---------+-----------+----------+-------+  Left Findings: +----------+------------+---------+-----------+----------+--------------+ LEFT      CompressiblePhasicitySpontaneousProperties   Summary     +----------+------------+---------+-----------+----------+--------------+ IJV           Full       Yes       Yes                             +----------+------------+---------+-----------+----------+--------------+ Subclavian    Full       Yes       Yes                             +----------+------------+---------+-----------+----------+--------------+ Axillary      Full       Yes       Yes                             +----------+------------+---------+-----------+----------+--------------+ Brachial      Full       Yes       Yes                              +----------+------------+---------+-----------+----------+--------------+  Radial        Full                                                 +----------+------------+---------+-----------+----------+--------------+ Ulnar                                               Not visualized +----------+------------+---------+-----------+----------+--------------+ Cephalic      Full                                                 +----------+------------+---------+-----------+----------+--------------+ Basilic                                             Not visualized +----------+------------+---------+-----------+----------+--------------+  Summary:  Right: No evidence of thrombosis in the subclavian.  Left: No evidence of deep vein thrombosis in the upper extremity. No evidence of superficial vein thrombosis in the upper extremity. Limited due to technical limitations as described above.  *See table(s) above for measurements and observations.  Diagnosing physician: Ruta Hinds MD Electronically signed by Ruta Hinds MD on 01/05/2021 at 5:32:59 PM.    Final     Labs:  CBC: Recent Labs    01/02/21 0406 01/03/21 0221 01/04/21 0651 01/07/21 0119  WBC 10.1 10.5 10.9* 14.7*  HGB 9.8* 10.7* 9.9* 9.5*  HCT 32.4* 34.1* 33.1* 30.7*  PLT 149* 119* 149* 174    COAGS: Recent Labs    01/04/21 0651 01/05/21 0142 01/06/21 0252 01/07/21 0119  INR 1.2 1.5* 2.6* 3.1*    BMP: Recent Labs    03/12/20 1202 03/26/20 1137 12/28/20 2141 01/01/21 0723 01/02/21 0406 01/03/21 0445 01/05/21 0142  NA 136 137   < > 139 139 138 132*  K 5.0 5.0   < > 3.1* 3.2* 4.0 3.7  CL 93* 94*   < > 100 99 100 96*  CO2 27 23   < > '28 30 28 28  '$ GLUCOSE 130* 184*   < > 208* 129* 145* 159*  BUN 31* 59*   < > 43* 40* 38* 27*  CALCIUM 8.8 9.3   < > 8.7* 8.9 8.7* 8.6*  CREATININE 1.70* 1.57*   < > 2.08* 2.06* 1.88* 1.39*  GFRNONAA 41* 46*   < > 34* 35* 39* 56*  GFRAA 48* 53*  --   --   --   --   --     < > = values in this interval not displayed.    LIVER FUNCTION TESTS: Recent Labs    01/01/21 0723 01/02/21 0406 01/03/21 0445 01/05/21 0142  BILITOT 3.7* 3.9* 4.1* 2.5*  AST 98* 89* 60* 45*  ALT 38 41 35 27  ALKPHOS 234* 267* 279* 316*  PROT 6.0* 6.3* 6.0* 5.9*  ALBUMIN 1.6* 1.7* 1.7* 1.8*    Assessment and Plan:  -Patient presents with caregiver for replacement vs. new insertion of percutaneous cholecystotomy tube. -Awaiting INR, but intend to proceed with procedure as planned. -Consent obtained from sister  Binnie Rail via telephone. -Upper extremity edema noted today.  Followed by B. Ayiku.  Risks and benefits discussed with the patient as well as sister, including, but not limited to bleeding, infection, gallbladder perforation, bile leak, sepsis or even death.  All questions were answered, patient is agreeable to proceed. Consent signed and in chart.   Thank you for this interesting consult.  I greatly enjoyed meeting Nishanth Brotherson and look forward to participating in their care.  A copy of this report was sent to the requesting provider on this date.  Electronically Signed: Pasty Spillers, PA 02/04/2021, 11:22 AM   I spent a total of 40 Minutes in face to face in clinical consultation, greater than 50% of which was counseling/coordinating care for percutaneous cholecystostomy tube reinsertion/placement

## 2021-02-04 NOTE — Progress Notes (Signed)
    Pt had perc chole drain placed in IR 12/29/20 per Dr Dennis Bast  ERCP done 8/4:   Cholangiogram demonstrates a small filling defect in the distal common bile duct consistent with choledocholithiasis. The filling defect is no longer present on the final images consistent with removal. IMPRESSION: 1. Choledocholithiasis. 2. Percutaneous cholecystostomy tube in place. 3. ERCP as above.   Drain has come out inadvertently while in Nursing facility This occurred yesterday  Facility unable to get pt to hospital yesterday Pt here at [facility] today for replacement/re insertion.  Pt on coumadin daily for Afib Last dose 9/7 INR today 3.4 Too high for IR procedure today-- since procedure might entail new placement  He is scheduled 02/09/21 at Rad for cholangiogram and possible removal  I called Dr Dennis Bast Discussed case with him. He recommends: leave tube out for now                             Pt has appt with [facility] 03/05/21                             To keep appt Ii have discussed with pt I have written note to facility [facility] RN has discussed with pts sister and facility RN  All in agreement

## 2021-02-04 NOTE — Progress Notes (Signed)
Plan of care called to [facility], Pt;s sister and Carolyn,pt's CNA and transportation.  Report and plan of care called to pts nurse,secret Eckert,RN and sent to facility in writing.

## 2021-02-09 ENCOUNTER — Other Ambulatory Visit: Payer: No Typology Code available for payment source

## 2021-02-09 ENCOUNTER — Ambulatory Visit (HOSPITAL_COMMUNITY): Admission: RE | Admit: 2021-02-09 | Payer: No Typology Code available for payment source | Source: Ambulatory Visit

## 2021-02-22 ENCOUNTER — Encounter: Payer: Self-pay | Admitting: Cardiology

## 2021-02-22 ENCOUNTER — Other Ambulatory Visit: Payer: Self-pay

## 2021-02-22 ENCOUNTER — Ambulatory Visit (INDEPENDENT_AMBULATORY_CARE_PROVIDER_SITE_OTHER): Payer: Medicare Other | Admitting: Cardiology

## 2021-02-22 VITALS — BP 128/74 | HR 84 | Ht 72.0 in | Wt 260.0 lb

## 2021-02-22 DIAGNOSIS — Z86718 Personal history of other venous thrombosis and embolism: Secondary | ICD-10-CM

## 2021-02-22 DIAGNOSIS — I25118 Atherosclerotic heart disease of native coronary artery with other forms of angina pectoris: Secondary | ICD-10-CM

## 2021-02-22 DIAGNOSIS — I480 Paroxysmal atrial fibrillation: Secondary | ICD-10-CM

## 2021-02-22 DIAGNOSIS — E1121 Type 2 diabetes mellitus with diabetic nephropathy: Secondary | ICD-10-CM

## 2021-02-22 DIAGNOSIS — I10 Essential (primary) hypertension: Secondary | ICD-10-CM

## 2021-02-22 NOTE — Progress Notes (Signed)
Cardiology Office Note:    Date:  02/22/2021   ID:  Matthew Gutierrez, DOB February 14, 1956, MRN [id number]  PCP:  Lillard Anes, MD  Cardiologist:  Jenne Campus, MD    Referring MD: Lillard Anes,*   Chief Complaint  Patient presents with   Follow-up    Hsopital    History of Present Illness:    Matthew Gutierrez is a 65 y.o. male that I met first time in August last year.  He does have past medical history significant for paroxysmal atrial fibrillation, history of DVT, lymphedema, obesity, peripheral vascular disease, chronic kidney failure.  I did see him that time for his episode of atrial fibrillation he did not have any complaints recently he ended being in the hospital because of confusion he was find to have cholecystitis he required colostomy tube he also got ERCP I am not sure exactly was a long plan in terms of his gallstones is.  He had did have episode of atrial fibrillation while in the hospital.  His ejection fraction at the time was 60 to 65%.  He does have dilatation of the ascending aorta measuring only 39 mm. He comes today to my office for follow-up.  He is on stretchers he lives in nursing home he is not too happy about it he is very slow in responses kind of difficult to get good history from him.  Denies have any palpitation chest pain tightness squeezing pressure burning chest he is staying in the bed or into chair.  But he does participate in physical therapy that he does not like he is back to sinus rhythm  Past Medical History:  Diagnosis Date   Absence epileptic syndrome, not intractable, without status epilepticus ([place]) 07/23/2019   Acute renal failure superimposed on stage 3 chronic kidney disease ([place]) 07/23/2019   Anemia, unspecified    Atherosclerotic heart disease 07/23/2019   Atrial fibrillation with RVR ([place]) 12/14/2019   Benign enlargement of prostate 07/23/2019   Body mass index 50.0-59.9, adult ([place]) 07/23/2019   Cellulitis of leg  12/14/2019   Chronic respiratory failure with hypoxia ([place]) 07/23/2019   Chronic venous hypertension (idiopathic) with ulcer of left lower extremity ([place]) 07/23/2019   COPD (chronic obstructive pulmonary disease) ([place]) 07/23/2019   Diabetic glomerulopathy ([place]) 10/15/2019   Diabetic neuropathy ([place]) 07/23/2019   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[place]) 10/15/2019   Epilepsy, grand mal ([place])    Essential hypertension 01/22/2018   Gout attack 10/06/2019   H/O: stroke 01/22/2018   History of DVT (deep vein thrombosis) 01/22/2018   Long term (current) use of anticoagulants 07/23/2019   Lumbago    Lymphedema of both lower extremities 01/22/2018   Mixed hyperlipidemia 07/23/2019   Morbid obesity ([place]) 07/23/2019   Obstructive sleep apnea 07/23/2019   Paroxysmal atrial fibrillation ([place]) 01/07/2020   Peripheral vascular disease, unspecified ([place]) 07/12/2011   Secondary hypercoagulable state ([place]) 01/07/2020   Shoulder bursitis 07/25/2019   Stroke [facility])     Past Surgical History:  Procedure Laterality Date   CYSTOSTOMY  01/07/2021   ERCP N/A 12/30/2020   Procedure: ENDOSCOPIC RETROGRADE CHOLANGIOPANCREATOGRAPHY (ERCP);  Surgeon: Ladene Artist, MD;  Location: [facility] ENDOSCOPY;  Service: Endoscopy;  Laterality: N/A;   IR PERC CHOLECYSTOSTOMY  12/29/2020   REMOVAL OF STONES  12/30/2020   Procedure: REMOVAL OF STONES;  Surgeon: Ladene Artist, MD;  Location: [facility] ENDOSCOPY;  Service: Endoscopy;;   SPHINCTEROTOMY  12/30/2020   Procedure: Joan Mayans;  Surgeon: Ladene Artist, MD;  Location: [place] ENDOSCOPY;  Service: Endoscopy;;    Current Medications: Current Meds  Medication Sig   Cholecalciferol (VITAMIN D3) 1.25 MG (50000 UT) TABS Take 50,000 Units by mouth 2 (two) times a week. Every Wed and Sat   feeding supplement (BOOST HIGH PROTEIN) LIQD Take 1 Container by mouth 2 (two) times daily between meals.   furosemide (LASIX) 20 MG tablet Take 20 mg by mouth 2 (two) times daily. Left arm edema    methocarbamol (ROBAXIN) 500 MG tablet Take 500 mg by mouth every 8 (eight) hours as needed for muscle spasms.   PRESCRIPTION MEDICATION Apply 250 Units topically See admin instructions. To be applied to necrotic wounds     Allergies:   Codeine   Social History   Socioeconomic History   Marital status: Divorced    Spouse name: Not on file   Number of children: 2   Years of education: Not on file   Highest education level: Not on file  Occupational History   Occupation: Disability  Tobacco Use   Smoking status: Former    Types: Cigarettes    Quit date: 10/28/2009    Years since quitting: 11.3   Smokeless tobacco: Never  Substance and Sexual Activity   Alcohol use: Not Currently    Comment: occassionally   Drug use: No   Sexual activity: Not Currently  Other Topics Concern   Not on file  Social History Narrative   Not on file   Social Determinants of Health   Financial Resource Strain: Not on file  Food Insecurity: Not on file  Transportation Needs: Not on file  Physical Activity: Not on file  Stress: Not on file  Social Connections: Not on file     Family History: The patient's family history includes Alcohol abuse in an other family member; Cancer in his mother and another family member; Heart disease in an other family member; Hyperlipidemia in an other family member; Hypertension in an other family member; Stroke in his father and another family member. ROS:   Please see the history of present illness.    All 14 point review of systems negative except as described per history of present illness  EKGs/Labs/Other Studies Reviewed:      Recent Labs: 12/28/2020: TSH 1.844 12/31/2020: B Natriuretic Peptide 374.1 01/04/2021: Magnesium 1.9 01/05/2021: ALT 27; BUN 27; Creatinine, Ser 1.39; Potassium 3.7; Sodium 132 01/07/2021: Hemoglobin 9.5; Platelets 174  Recent Lipid Panel    Component Value Date/Time   CHOL 205 (H) 03/12/2020 1202   TRIG 162 (H) 03/12/2020 1202   HDL  38 (L) 03/12/2020 1202   CHOLHDL 5.4 (H) 03/12/2020 1202   LDLCALC 138 (H) 03/12/2020 1202    Physical Exam:    VS:  BP 128/74 (BP Location: Right Arm, Patient Position: Sitting)   Pulse 84   Ht 6' (1.829 m)   Wt 260 lb (117.9 kg)   SpO2 95%   BMI 35.26 kg/m     Wt Readings from Last 3 Encounters:  02/22/21 260 lb (117.9 kg)  01/07/21 255 lb 11.7 oz (116 kg)  03/12/20 288 lb (130.6 kg)     GEN:  Well nourished, well developed in no acute distress HEENT: Normal NECK: No JVD; No carotid bruits LYMPHATICS: No lymphadenopathy CARDIAC: RRR, no murmurs, no rubs, no gallops RESPIRATORY:  Clear to auscultation without rales, wheezing or rhonchi  ABDOMEN: Soft, non-tender, non-distended MUSCULOSKELETAL:  No edema; No deformity  SKIN: Warm and dry LOWER EXTREMITIES: no swelling NEUROLOGIC:  Alert and oriented x  3 PSYCHIATRIC:  Normal affect   ASSESSMENT:    1. Essential hypertension   2. Atherosclerosis of native coronary artery of native heart with stable angina pectoris (HCC)   3. Paroxysmal atrial fibrillation ([place])   4. Diabetic glomerulopathy ([place])   5. History of DVT (deep vein thrombosis)    PLAN:    In order of problems listed above:  Paroxysmal atrial fibrillation his CHA2DS2-VASc equals 6.  He still anticoagulated with Coumadin which we will continue.  I am not sure exactly why nor he is not on a newer agent like it was suggested before but he seems to be controlled quite nicely with Coumadin.  Probably the reason for his atrial fibrillation was simply medical stress of infection. Atherosclerosis with coronary artery disease stable no symptoms. Essential hypertension blood pressure well controlled continue present management. Dyslipidemia I did review her K PN which show me his LDL of 138 HDL 38 this is however from last year we will make arrangements for fasting lipid profile to be done.  He is taking Lipitor 10 mg daily. Overall it was long-term difficult visit if  he does not have a good understanding of his problems.  We will try to get more information about what is the plan in terms of his gallbladder issues   Medication Adjustments/Labs and Tests Ordered: Current medicines are reviewed at length with the patient today.  Concerns regarding medicines are outlined above.  Orders Placed This Encounter  Procedures   EKG 12-Lead   Medication changes: No orders of the defined types were placed in this encounter.   Signed, Park Liter, MD, [facility] 02/22/2021 3:54 PM    [place]

## 2021-02-22 NOTE — Patient Instructions (Signed)
Medication Instructions:  Your physician recommends that you continue on your current medications as directed. Please refer to the Current Medication list given to you today.  *If you need a refill on your cardiac medications before your next appointment, please call your pharmacy*   Lab Work: None ordered If you have labs (blood work) drawn today and your tests are completely normal, you will receive your results only by: [facility] (if you have MyChart) OR A paper copy in the mail If you have any lab test that is abnormal or we need to change your treatment, we will call you to review the results.   Testing/Procedures: None ordered   Follow-Up: At [facility], you and your health needs are our priority.  As part of our continuing mission to provide you with exceptional heart care, we have created designated Provider Care Teams.  These Care Teams include your primary Cardiologist (physician) and Advanced Practice Providers (APPs -  Physician Assistants and Nurse Practitioners) who all work together to provide you with the care you need, when you need it.  We recommend signing up for the patient portal called "MyChart".  Sign up information is provided on this After Visit Summary.  MyChart is used to connect with patients for Virtual Visits (Telemedicine).  Patients are able to view lab/test results, encounter notes, upcoming appointments, etc.  Non-urgent messages can be sent to your provider as well.   To learn more about what you can do with MyChart, go to [email or web address].    Your next appointment:   3 month(s)  The format for your next appointment:   In Person  Provider:   Jenne Campus, MD   Other Instructions NA

## 2021-05-06 DIAGNOSIS — Z89611 Acquired absence of right leg above knee: Secondary | ICD-10-CM | POA: Insufficient documentation

## 2021-05-17 ENCOUNTER — Telehealth: Payer: Self-pay

## 2021-05-17 NOTE — Telephone Encounter (Signed)
Spoke with Matthew Gutierrez  regarding medical records request from [facility]. She is aware the facility is not listed on our DPR therefore , we are unable to process this request. She is working on obtaining a medical release of information form from the company.

## 2021-06-07 ENCOUNTER — Ambulatory Visit: Payer: Medicare Other | Admitting: Cardiology

## 2021-07-06 ENCOUNTER — Ambulatory Visit (INDEPENDENT_AMBULATORY_CARE_PROVIDER_SITE_OTHER): Payer: PRIVATE HEALTH INSURANCE | Admitting: Cardiology

## 2021-07-06 ENCOUNTER — Other Ambulatory Visit: Payer: Self-pay

## 2021-07-06 ENCOUNTER — Encounter: Payer: Self-pay | Admitting: Cardiology

## 2021-07-06 VITALS — BP 100/62 | HR 83 | Ht 72.0 in | Wt 198.2 lb

## 2021-07-06 DIAGNOSIS — I480 Paroxysmal atrial fibrillation: Secondary | ICD-10-CM | POA: Diagnosis not present

## 2021-07-06 DIAGNOSIS — E1149 Type 2 diabetes mellitus with other diabetic neurological complication: Secondary | ICD-10-CM

## 2021-07-06 DIAGNOSIS — G40909 Epilepsy, unspecified, not intractable, without status epilepticus: Secondary | ICD-10-CM

## 2021-07-06 DIAGNOSIS — I10 Essential (primary) hypertension: Secondary | ICD-10-CM

## 2021-07-06 DIAGNOSIS — I25118 Atherosclerotic heart disease of native coronary artery with other forms of angina pectoris: Secondary | ICD-10-CM | POA: Diagnosis not present

## 2021-07-06 DIAGNOSIS — Z86718 Personal history of other venous thrombosis and embolism: Secondary | ICD-10-CM

## 2021-07-06 NOTE — Progress Notes (Signed)
Ekg

## 2021-07-06 NOTE — Progress Notes (Signed)
Cardiology Office Note:    Date:  07/06/2021   ID:  Matthew Gutierrez, DOB Dec 11, 1955, MRN [id number]  PCP:  Lillard Anes, MD  Cardiologist:  Jenne Campus, MD    Referring MD: Lillard Anes,*   No chief complaint on file. I am doing fine  History of Present Illness:    Matthew Gutierrez is a 66 y.o. male with past medical history significant for paroxysmal atrial fibrillation, history of DVT, lymphedema, obesity, peripheral vascular disease, chronic kidney failure, history of coronary artery disease.  Last time I seen him was in September 2022, apparently reason for that was episode of atrial fibrillation.  He ended up going to the hospital because of confusion he was found to have cholecystitis colostomy tube was inserted and and also ERCP was performed.  Last time I seen him he was still have a tube in his gallbladder but now tube has been removed.  Echocardiogram performed in the hospital showed preserved left ventricle ejection fraction there is dilatation of her aorta measuring 39 mm. He comes today to my office for follow-up since the time I seen him last time he lost his right leg he does have above-knee amputation.  His heart rate appears to be regular however I will do an EKG to confirm the rhythm.  Denies have any chest pain tightness squeezing pressure burning chest but after mid conversation with him is somewhat limited and difficult  Past Medical History:  Diagnosis Date   Absence epileptic syndrome, not intractable, without status epilepticus ([place]) 07/23/2019   Acute renal failure superimposed on stage 3 chronic kidney disease ([place]) 07/23/2019   Anemia, unspecified    Atherosclerotic heart disease 07/23/2019   Atrial fibrillation with RVR ([place]) 12/14/2019   Benign enlargement of prostate 07/23/2019   Body mass index 50.0-59.9, adult ([place]) 07/23/2019   Cellulitis of leg 12/14/2019   Chronic respiratory failure with hypoxia ([place]) 07/23/2019   Chronic venous  hypertension (idiopathic) with ulcer of left lower extremity ([place]) 07/23/2019   COPD (chronic obstructive pulmonary disease) ([place]) 07/23/2019   Diabetic glomerulopathy ([place]) 10/15/2019   Diabetic neuropathy ([place]) 07/23/2019   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[place]) 10/15/2019   Epilepsy, grand mal ([place])    Essential hypertension 01/22/2018   Gout attack 10/06/2019   H/O: stroke 01/22/2018   History of DVT (deep vein thrombosis) 01/22/2018   Long term (current) use of anticoagulants 07/23/2019   Lumbago    Lymphedema of both lower extremities 01/22/2018   Mixed hyperlipidemia 07/23/2019   Morbid obesity ([place]) 07/23/2019   Obstructive sleep apnea 07/23/2019   Paroxysmal atrial fibrillation ([place]) 01/07/2020   Peripheral vascular disease, unspecified ([place]) 07/12/2011   Secondary hypercoagulable state ([place]) 01/07/2020   Shoulder bursitis 07/25/2019   Stroke [facility])     Past Surgical History:  Procedure Laterality Date   ABOVE KNEE LEG AMPUTATION Right    CYSTOSTOMY  01/07/2021   ERCP N/A 12/30/2020   Procedure: ENDOSCOPIC RETROGRADE CHOLANGIOPANCREATOGRAPHY (ERCP);  Surgeon: Ladene Artist, MD;  Location: [facility] ENDOSCOPY;  Service: Endoscopy;  Laterality: N/A;   IR PERC CHOLECYSTOSTOMY  12/29/2020   REMOVAL OF STONES  12/30/2020   Procedure: REMOVAL OF STONES;  Surgeon: Ladene Artist, MD;  Location: [place];  Service: Endoscopy;;   SPHINCTEROTOMY  12/30/2020   Procedure: Joan Mayans;  Surgeon: Ladene Artist, MD;  Location[facility] ENDOSCOPY;  Service: Endoscopy;;    Current Medications: Current Meds  Medication Sig   albuterol (PROVENTIL) (2.5 MG/3ML) 0.083% nebulizer solution Inhale 3 mLs (2.5  mg total) into the lungs every 6 (six) hours as needed for wheezing or shortness of breath.   atorvastatin (LIPITOR) 10 MG tablet Take 10 mg by mouth daily.   Cholecalciferol (VITAMIN D3) 1.25 MG (50000 UT) TABS Take 50,000 Units by mouth 2 (two) times a week. Every Wed and Sat   divalproex  (DEPAKOTE) 125 MG DR tablet Take 125 mg by mouth 2 (two) times daily.   divalproex (DEPAKOTE) 500 MG DR tablet Take 500 mg by mouth 2 (two) times daily.   doxepin (SINEQUAN) 10 MG/ML solution Take 0.3 mLs by mouth daily. Insomia   Dulaglutide (TRULICITY) 9.32 IZ/1.2WP SOPN Inject 0.75 mg into the skin every Tuesday.   DULoxetine (CYMBALTA) 60 MG capsule Take 60 mg by mouth daily.   feeding supplement (BOOST HIGH PROTEIN) LIQD Take 1 Container by mouth 2 (two) times daily between meals.   fluticasone (FLONASE) 50 MCG/ACT nasal spray Place 1 spray into both nostrils daily.   gabapentin (NEURONTIN) 300 MG capsule Take 1 capsule (300 mg total) by mouth at bedtime.   LORazepam (ATIVAN) 0.5 MG tablet Take 0.5 mg by mouth 2 (two) times daily as needed for anxiety.   methocarbamol (ROBAXIN) 500 MG tablet Take 500 mg by mouth every 8 (eight) hours as needed for muscle spasms.   phenytoin (DILANTIN) 100 MG ER capsule Take 1 capsule (100 mg total) by mouth 2 (two) times daily.   risperiDONE (RISPERDAL) 1 MG tablet Take 1 mg by mouth 2 (two) times daily.   warfarin (COUMADIN) 2 MG tablet Take 2 mg by mouth daily.     Allergies:   Codeine   Social History   Socioeconomic History   Marital status: Divorced    Spouse name: Not on file   Number of children: 2   Years of education: Not on file   Highest education level: Not on file  Occupational History   Occupation: Disability  Tobacco Use   Smoking status: Former    Types: Cigarettes    Quit date: 10/28/2009    Years since quitting: 11.6   Smokeless tobacco: Never  Substance and Sexual Activity   Alcohol use: Not Currently    Comment: occassionally   Drug use: No   Sexual activity: Not Currently  Other Topics Concern   Not on file  Social History Narrative   Not on file   Social Determinants of Health   Financial Resource Strain: Not on file  Food Insecurity: Not on file  Transportation Needs: Not on file  Physical Activity: Not on  file  Stress: Not on file  Social Connections: Not on file     Family History: The patient's family history includes Alcohol abuse in an other family member; Cancer in his mother and another family member; Heart disease in an other family member; Hyperlipidemia in an other family member; Hypertension in an other family member; Stroke in his father and another family member. ROS:   Please see the history of present illness.    All 14 point review of systems negative except as described per history of present illness  EKGs/Labs/Other Studies Reviewed:      Recent Labs: 12/28/2020: TSH 1.844 12/31/2020: B Natriuretic Peptide 374.1 01/04/2021: Magnesium 1.9 01/05/2021: ALT 27; BUN 27; Creatinine, Ser 1.39; Potassium 3.7; Sodium 132 01/07/2021: Hemoglobin 9.5; Platelets 174  Recent Lipid Panel    Component Value Date/Time   CHOL 205 (H) 03/12/2020 1202   TRIG 162 (H) 03/12/2020 1202   HDL 38 (L) 03/12/2020  1202   CHOLHDL 5.4 (H) 03/12/2020 1202   LDLCALC 138 (H) 03/12/2020 1202    Physical Exam:    VS:  BP 100/62 (BP Location: Right Arm, Patient Position: Sitting)    Pulse 83    Ht 6' (1.829 m)    Wt 198 lb 3.2 oz (89.9 kg)    SpO2 96%    BMI 26.88 kg/m     Wt Readings from Last 3 Encounters:  07/06/21 198 lb 3.2 oz (89.9 kg)  02/22/21 260 lb (117.9 kg)  01/07/21 255 lb 11.7 oz (116 kg)     GEN:  Well nourished, well developed in no acute distress HEENT: Normal NECK: No JVD; No carotid bruits LYMPHATICS: No lymphadenopathy CARDIAC: RRR, no murmurs, no rubs, no gallops RESPIRATORY:  Clear to auscultation without rales, wheezing or rhonchi  ABDOMEN: Soft, non-tender, non-distended MUSCULOSKELETAL:  No edema; No deformity  SKIN: Warm and dry LOWER EXTREMITIES: no swelling above-knee amputation on the right side NEUROLOGIC:  Alert and oriented x 3 PSYCHIATRIC:  Normal affect   ASSESSMENT:    1. Paroxysmal atrial fibrillation (HCC)   2. Essential hypertension   3.  Atherosclerosis of native coronary artery of native heart with stable angina pectoris ([place])   4. Other diabetic neurological complication associated with type 2 diabetes mellitus ([place])   5. Seizure disorder ([place])   6. History of DVT (deep vein thrombosis)    PLAN:    In order of problems listed above:  Paroxysmal atrial fibrillation, EKG will be performed today to confirm the rhythm.  There is a request about switching from Coumadin to Eliquis which have absolutely no objections.  His dose should be 5 mg twice daily we will do that.  The reason for his anticoagulation is paroxysmal atrial fibrillation.  His CHA2DS2-VASc is at least 4. Essential hypertension, his blood pressure seems to be decently controlled today.  We will continue present management. Coronary disease stable denies have any symptoms Type 2 diabetes that being followed by internal medicine team, I do have his hemoglobin A1c in K PN from 8-22 which is 8.6.  Should be better controlled.  Plan for his facility will be to discontinue his Coumadin 3 days later he need to start Eliquis 5 mg twice daily.   Medication Adjustments/Labs and Tests Ordered: Current medicines are reviewed at length with the patient today.  Concerns regarding medicines are outlined above.  No orders of the defined types were placed in this encounter.  Medication changes: No orders of the defined types were placed in this encounter.   Signed, Park Liter, MD, [facility] 07/06/2021 1:28 PM    [place]

## 2021-07-06 NOTE — Patient Instructions (Signed)

## 2021-08-03 ENCOUNTER — Telehealth: Payer: Self-pay | Admitting: Diagnostic Neuroimaging

## 2021-08-03 NOTE — Telephone Encounter (Signed)
I tried to call the patient's phone number to reschedule and the number on file was not working. I called the patient's sister and left a message on her voice mail asking her to have him call us back to reschedule his appointment in April due to Dr. Leta Baptist being off that week. ?

## 2021-09-10 ENCOUNTER — Ambulatory Visit: Payer: Medicare Other | Admitting: Diagnostic Neuroimaging

## 2021-09-15 ENCOUNTER — Encounter: Payer: Self-pay | Admitting: *Deleted

## 2021-09-15 ENCOUNTER — Other Ambulatory Visit: Payer: Self-pay | Admitting: *Deleted

## 2021-09-20 ENCOUNTER — Encounter: Payer: Self-pay | Admitting: Diagnostic Neuroimaging

## 2021-09-20 ENCOUNTER — Telehealth: Payer: Self-pay | Admitting: Diagnostic Neuroimaging

## 2021-09-20 ENCOUNTER — Ambulatory Visit (INDEPENDENT_AMBULATORY_CARE_PROVIDER_SITE_OTHER): Payer: Medicare Other | Admitting: Diagnostic Neuroimaging

## 2021-09-20 VITALS — BP 122/64 | HR 79

## 2021-09-20 DIAGNOSIS — G252 Other specified forms of tremor: Secondary | ICD-10-CM | POA: Diagnosis not present

## 2021-09-20 DIAGNOSIS — R29898 Other symptoms and signs involving the musculoskeletal system: Secondary | ICD-10-CM

## 2021-09-20 DIAGNOSIS — R29810 Facial weakness: Secondary | ICD-10-CM

## 2021-09-20 NOTE — Telephone Encounter (Signed)
[employer] medicare order sent to [facility], [facility] they will reach out to the patient to schedule.  

## 2021-09-20 NOTE — Patient Instructions (Addendum)
?  RESTING TREMOR, BRADYKINESIA, COGWHEELING RIGIDITY (onset possibly in ~2021) ?- parkinsonism noted; could be due to underlying idiopathic parkinson's dz, vascular parkinsonism or medication side effect (risperidal) ?- check MRI brain ?- consider to reduce risperidal; divalproex also can aggravate tremors ?- consider trial of carb/levo in future ? ?LEFT SIDED WEAKNESS (face, arm) ?- could be chronic stroke; onset in last 1 year? ?- check MRI brain ?

## 2021-09-20 NOTE — Progress Notes (Signed)
? ?[facility] ? ?PATIENT: Matthew Gutierrez ?DOB: [birth year] ? ?REFERRING CLINICIAN: Loma Boston, F* ?HISTORY FROM: patient  ?REASON FOR VISIT: new consult  ? ? ?HISTORICAL ? ?CHIEF COMPLAINT:  ?Chief Complaint  ?Patient presents with  ? New Patient (Initial Visit)  ?  Rm 7 with Driver from [facility]; here to discuss worsening tremors of face and hands. Hx for epilepsy.   ? ? ?HISTORY OF PRESENT ILLNESS:  ? ?66 year old male here for evaluation of tremors.  History of bipolar disorder, anxiety, depression, DVT, CKD, CHF, diabetes and stroke. ? ?Patient has had at least 2 years of progressive tremor, gait, cognitive decline.  Has had multiple hospitalizations.  He has developed severe decubitus ulcers.  He was admitted in December 2022 and needed right above-knee amputation.  Has been living in nursing home since that time.  Has had progressive tremors on the right side, now affecting left side. ? ? ? ?REVIEW OF SYSTEMS: Full 14 system review of systems performed and negative with exception of: as per HPI. ? ?ALLERGIES: ?Allergies  ?Allergen Reactions  ? Codeine Other (See Comments)  ?  Unknown Reaction  ? ? ?HOME MEDICATIONS: ?Outpatient Medications Prior to Visit  ?Medication Sig Dispense Refill  ? albuterol (PROVENTIL) (2.5 MG/3ML) 0.083% nebulizer solution Inhale 3 mLs (2.5 mg total) into the lungs every 6 (six) hours as needed for wheezing or shortness of breath. 75 mL 2  ? atorvastatin (LIPITOR) 10 MG tablet Take 10 mg by mouth daily.    ? Cholecalciferol (VITAMIN D3) 1.25 MG (50000 UT) TABS Take 50,000 Units by mouth 2 (two) times a week. Every Wed and Sat    ? divalproex (DEPAKOTE) 125 MG DR tablet Take 125 mg by mouth 2 (two) times daily.    ? divalproex (DEPAKOTE) 500 MG DR tablet Take 500 mg by mouth 2 (two) times daily.    ? doxepin (SINEQUAN) 10 MG/ML solution Take by mouth. 0.3 ml daily    ? Dulaglutide (TRULICITY) 4.53 MI/6.8EH SOPN Inject 0.75 mg into the skin every  Tuesday.    ? DULoxetine (CYMBALTA) 60 MG capsule Take 1 capsule by mouth daily.    ? ELIQUIS 5 MG TABS tablet Take 5 mg by mouth 2 (two) times daily.    ? feeding supplement (BOOST HIGH PROTEIN) LIQD Take 1 Container by mouth 2 (two) times daily between meals.    ? fluticasone (FLONASE) 50 MCG/ACT nasal spray Place 1 spray into both nostrils daily. 18.2 mL 2  ? furosemide (LASIX) 20 MG tablet Take 20 mg by mouth 2 (two) times daily. Left arm edema    ? gabapentin (NEURONTIN) 300 MG capsule Take 1 capsule (300 mg total) by mouth at bedtime. 90 capsule 2  ? LORazepam (ATIVAN) 0.5 MG tablet Take 0.5 mg by mouth 2 (two) times daily as needed for anxiety.    ? methocarbamol (ROBAXIN) 500 MG tablet Take 500 mg by mouth every 8 (eight) hours as needed for muscle spasms.    ? phenytoin (DILANTIN) 100 MG ER capsule Take 1 capsule (100 mg total) by mouth 2 (two) times daily.    ? polyethylene glycol powder (GLYCOLAX/MIRALAX) 17 GM/SCOOP powder Take 1 Container by mouth every morning.    ? PRESCRIPTION MEDICATION Apply 250 Units topically See admin instructions. To be applied to necrotic wounds    ? risperiDONE (RISPERDAL) 1 MG tablet Take 2 mg by mouth 2 (two) times daily.    ? atorvastatin (LIPITOR) 10 MG tablet Take  10 mg by mouth at bedtime.    ? Cholecalciferol 1.25 MG (50000 UT) TABS Take by mouth. 2 x weekly    ? DULoxetine (CYMBALTA) 60 MG capsule Take 60 mg by mouth daily.    ? gabapentin (NEURONTIN) 300 MG capsule Take 1 capsule by mouth at bedtime. (Patient not taking: Reported on 09/20/2021)    ? LORazepam (ATIVAN) 0.5 MG tablet Take 0.5 mg by mouth in the morning and at bedtime. (Patient not taking: Reported on 09/20/2021)    ? methocarbamol (ROBAXIN) 500 MG tablet Take by mouth as needed. (Patient not taking: Reported on 09/20/2021)    ? polyethylene glycol powder (GLYCOLAX/MIRALAX) 17 GM/SCOOP powder Take by mouth daily. (Patient not taking: Reported on 09/20/2021)    ? warfarin (COUMADIN) 2 MG tablet Take 2 mg by  mouth daily. (Patient not taking: Reported on 09/20/2021)    ? ?No facility-administered medications prior to visit.  ? ? ?PAST MEDICAL HISTORY: ?Past Medical History:  ?Diagnosis Date  ? Absence epileptic syndrome, not intractable, without status epilepticus ([place]) 07/23/2019  ? Acute renal failure superimposed on stage 3 chronic kidney disease ([place]) 07/23/2019  ? Anemia, unspecified   ? Atherosclerotic heart disease 07/23/2019  ? Atrial fibrillation with RVR ([place]) 12/14/2019  ? Benign enlargement of prostate 07/23/2019  ? Bipolar 1 disorder ([place])   ? Body mass index 50.0-59.9, adult ([place]) 07/23/2019  ? Cellulitis of leg 12/14/2019  ? Chronic respiratory failure with hypoxia ([place]) 07/23/2019  ? Chronic venous hypertension (idiopathic) with ulcer of left lower extremity ([place]) 07/23/2019  ? COPD (chronic obstructive pulmonary disease) ([place]) 07/23/2019  ? Diabetic glomerulopathy ([place]) 10/15/2019  ? Diabetic neuropathy ([place]) 07/23/2019  ? 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[place]) 10/15/2019  ? DVT (deep venous thrombosis) ([place])   ? Epilepsy, grand mal ([place])   ? Essential hypertension 01/22/2018  ? Gout attack 10/06/2019  ? H/O: stroke 01/22/2018  ? History of DVT (deep vein thrombosis) 01/22/2018  ? Long term (current) use of anticoagulants 07/23/2019  ? Lumbago   ? Lymphedema of both lower extremities 01/22/2018  ? Mixed hyperlipidemia 07/23/2019  ? Morbid obesity ([place]) 07/23/2019  ? Obstructive sleep apnea 07/23/2019  ? Paroxysmal atrial fibrillation ([place]) 01/07/2020  ? Peripheral vascular disease, unspecified ([place]) 07/12/2011  ? Schizophrenia ([place])   ? Secondary hypercoagulable state ([place]) 01/07/2020  ? Shoulder bursitis 07/25/2019  ? Stroke [facility])   ? Tremor   ? Type 2 diabetes mellitus ([place])   ? ? ?PAST SURGICAL HISTORY: ?Past Surgical History:  ?Procedure Laterality Date  ? ABOVE KNEE LEG AMPUTATION Right   ? CYSTOSTOMY  01/07/2021  ? ERCP N/A 12/30/2020  ? Procedure: ENDOSCOPIC RETROGRADE CHOLANGIOPANCREATOGRAPHY (ERCP);   Surgeon: Ladene Artist, MD;  Location: [facility] ENDOSCOPY;  Service: Endoscopy;  Laterality: N/A;  ? IR PERC CHOLECYSTOSTOMY  12/29/2020  ? REMOVAL OF STONES  12/30/2020  ? Procedure: REMOVAL OF STONES;  Surgeon: Ladene Artist, MD;  Location: [facility];  Service: Endoscopy;;  ? SPHINCTEROTOMY  12/30/2020  ? Procedure: SPHINCTEROTOMY;  Surgeon: Ladene Artist, MD;  Location: [facility] ENDOSCOPY;  Service: Endoscopy;;  ? ? ?FAMILY HISTORY: ?Family History  ?Problem Relation Age of Onset  ? Cancer Mother   ? Stroke Father   ? Hypertension Other   ? Stroke Other   ? Hyperlipidemia Other   ? Alcohol abuse Other   ? Heart disease Other   ? Cancer Other   ? ? ?SOCIAL HISTORY: ?Social History  ? ?Socioeconomic History  ? Marital status:  Divorced  ?  Spouse name: Not on file  ? Number of children: 2  ? Years of education: Not on file  ? Highest education level: Not on file  ?Occupational History  ? Occupation: Disability  ?Tobacco Use  ? Smoking status: Former  ?  Types: Cigarettes  ?  Quit date: 10/28/2009  ?  Years since quitting: 11.9  ? Smokeless tobacco: Never  ?Substance and Sexual Activity  ? Alcohol use: Not Currently  ?  Comment: occassionally  ? Drug use: No  ? Sexual activity: Not Currently  ?Other Topics Concern  ? Not on file  ?Social History Narrative  ? 09/15/21 [facility] resident  ? ?Social Determinants of Health  ? ?Financial Resource Strain: Not on file  ?Food Insecurity: Not on file  ?Transportation Needs: Not on file  ?Physical Activity: Not on file  ?Stress: Not on file  ?Social Connections: Not on file  ?Intimate Partner Violence: Not on file  ? ? ? ?PHYSICAL EXAM ? ?GENERAL EXAM/CONSTITUTIONAL: ?Vitals:  ?Vitals:  ? 09/20/21 0852  ?BP: 122/64  ?Pulse: 79  ?SpO2: 97%  ? ?There is no height or weight on file to calculate BMI. ?Wt Readings from Last 3 Encounters:  ?07/06/21 198 lb 3.2 oz (89.9 kg)  ?02/22/21 260 lb (117.9 kg)  ?01/07/21 255 lb 11.7 oz (116 kg)  ? ?Patient is in no distress; well  developed, nourished and groomed; neck is supple ? ?CARDIOVASCULAR: ?Examination of carotid arteries is normal; no carotid bruits ?Regular rate and rhythm, no murmurs ?Examination of peripheral vascular system by

## 2021-09-29 NOTE — Telephone Encounter (Signed)
order sent to [facility]  ?

## 2021-10-21 ENCOUNTER — Ambulatory Visit (HOSPITAL_COMMUNITY)
Admission: RE | Admit: 2021-10-21 | Discharge: 2021-10-21 | Disposition: A | Discharge status: Home / Self Care | Payer: Medicare Other | Source: Ambulatory Visit | Attending: Diagnostic Neuroimaging | Admitting: Diagnostic Neuroimaging

## 2021-10-21 DIAGNOSIS — G20 Parkinson's disease: Secondary | ICD-10-CM | POA: Diagnosis not present

## 2021-10-21 DIAGNOSIS — I6782 Cerebral ischemia: Secondary | ICD-10-CM | POA: Insufficient documentation

## 2021-10-21 DIAGNOSIS — R29898 Other symptoms and signs involving the musculoskeletal system: Secondary | ICD-10-CM

## 2021-10-21 DIAGNOSIS — Z8673 Personal history of transient ischemic attack (TIA), and cerebral infarction without residual deficits: Secondary | ICD-10-CM | POA: Diagnosis not present

## 2021-10-21 DIAGNOSIS — R29810 Facial weakness: Secondary | ICD-10-CM | POA: Diagnosis present

## 2021-10-21 DIAGNOSIS — G252 Other specified forms of tremor: Secondary | ICD-10-CM

## 2021-10-28 ENCOUNTER — Telehealth: Payer: Self-pay | Admitting: *Deleted

## 2021-10-28 NOTE — Telephone Encounter (Signed)
Fax has failed multiple times; I called [facility] again and got new fax [phone]. Still unable to get fax to go through after multiple attempts.

## 2021-10-28 NOTE — Telephone Encounter (Signed)
Called # listed for patient and reached [facility], spoke with [facility] LPN for patient. She advised I call his sister with MRI results. She requested a copy be faxed to facility. Called sister [place] on [place] and LVM to inform her the MRI brain showed chronic (old) right thalamic and left subcortical lacunar infarcts.  Dr Leta Baptist advises to continue medical management for stroke risk factors. Left # for questions. Report faxed to [facility].

## 2021-12-07 IMAGING — US US RENAL
1 series · 14 of 19 positions shown · non-contrast
Comparison: 12/09/2019

CLINICAL DATA: Acute kidney injury, stage 3 chronic kidney disease

EXAM:
RENAL / URINARY TRACT ULTRASOUND COMPLETE

[Series 1: us renal · 14 of 19 slices shown]
[im 1/19]
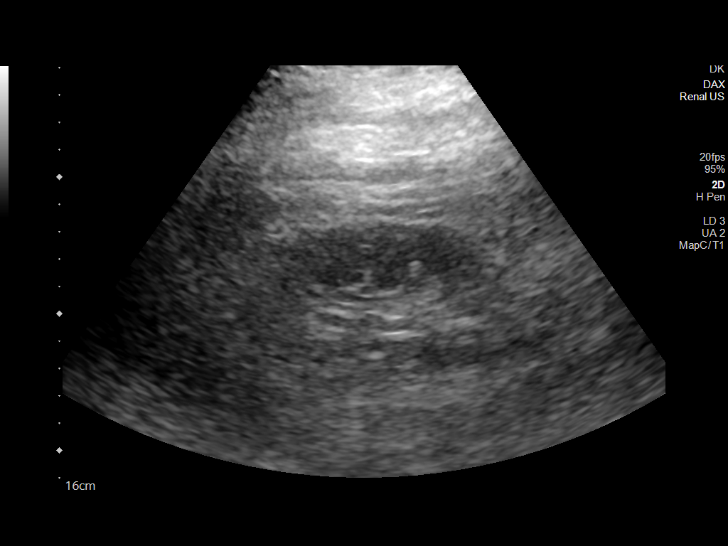
[im 3/19]
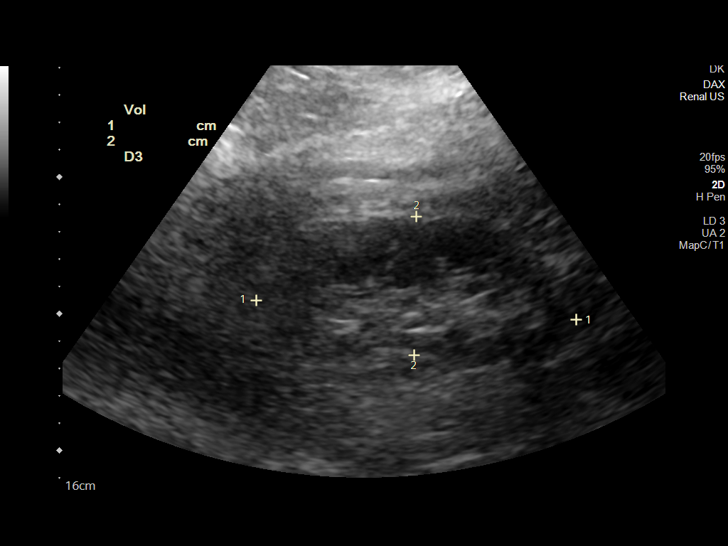
[im 4/19]
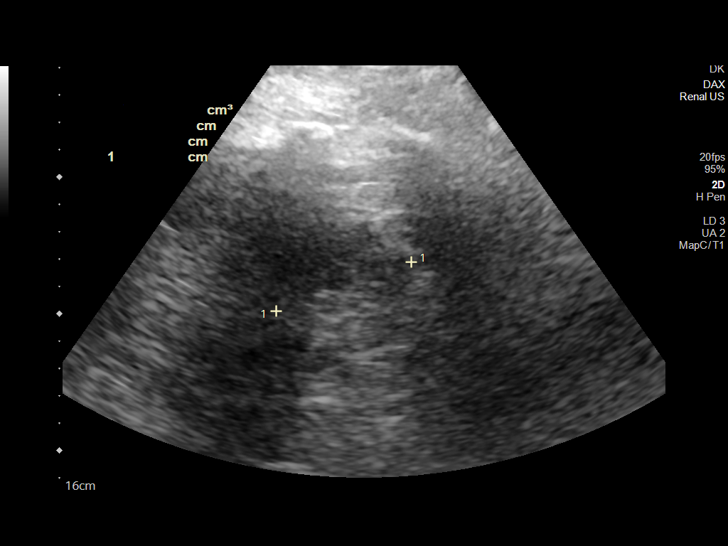
[im 5/19]
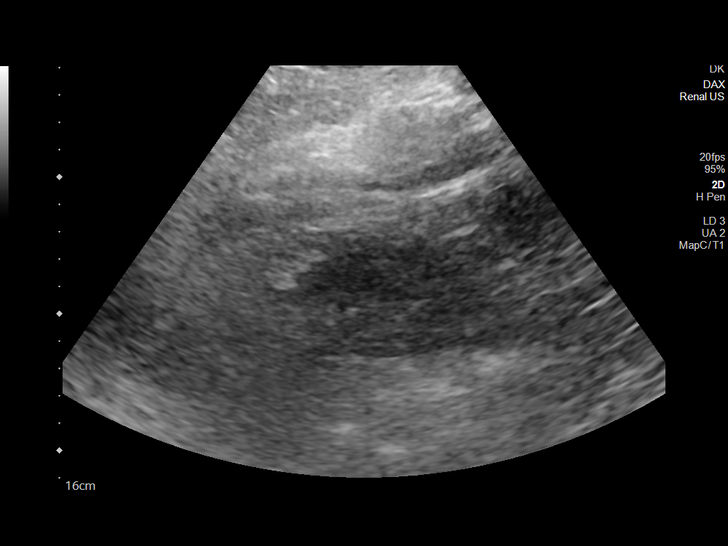
[im 7/19]
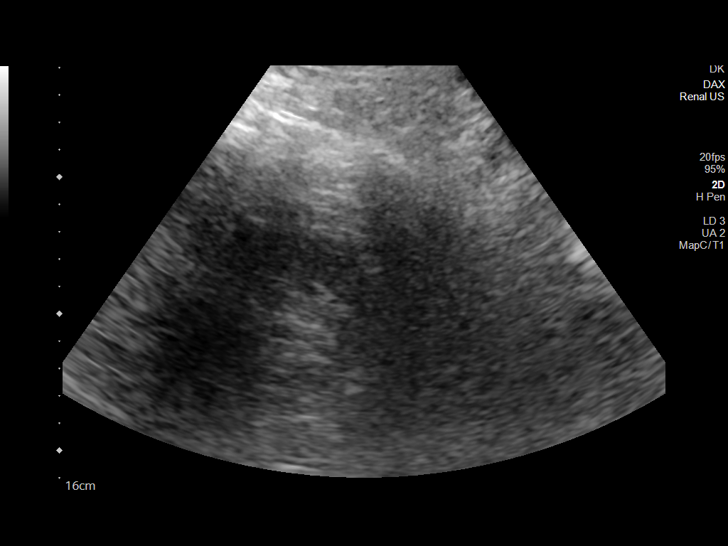
[im 8/19]
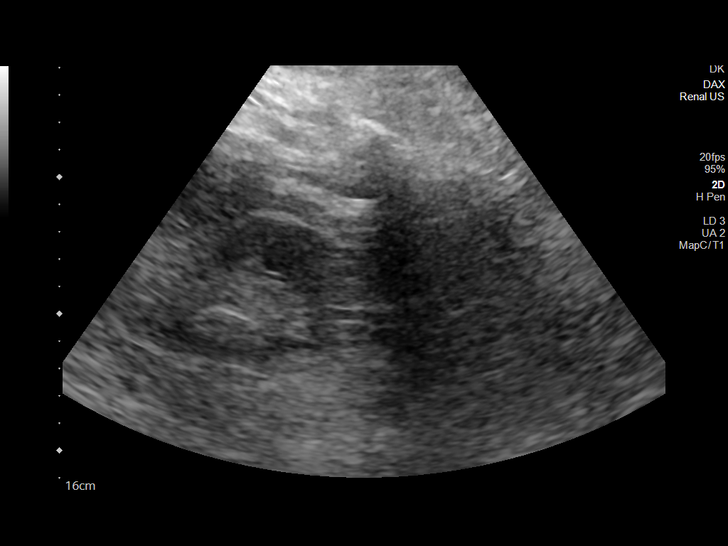
[im 9/19]
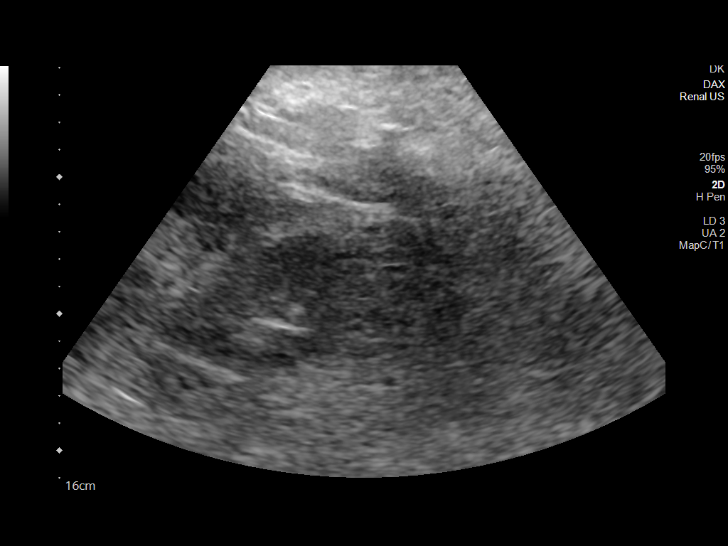
[im 11/19]
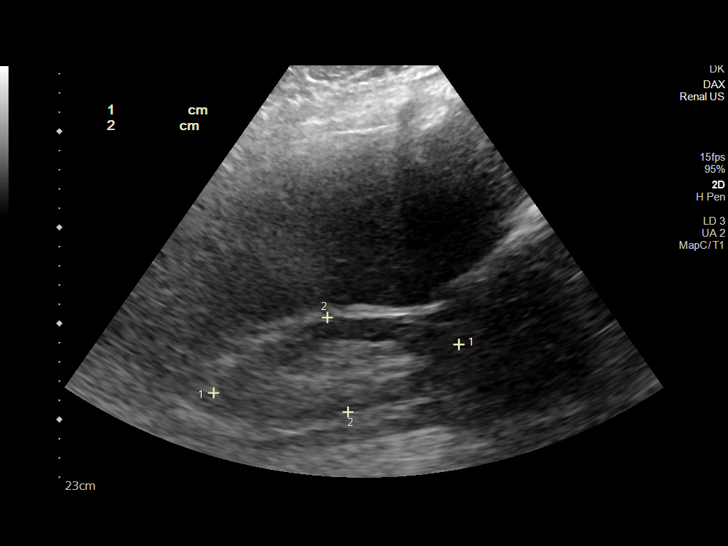
[im 12/19]
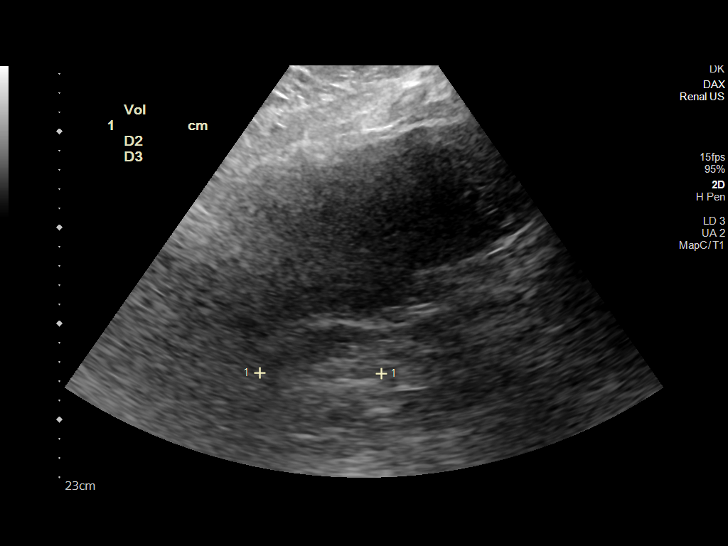
[im 13/19]
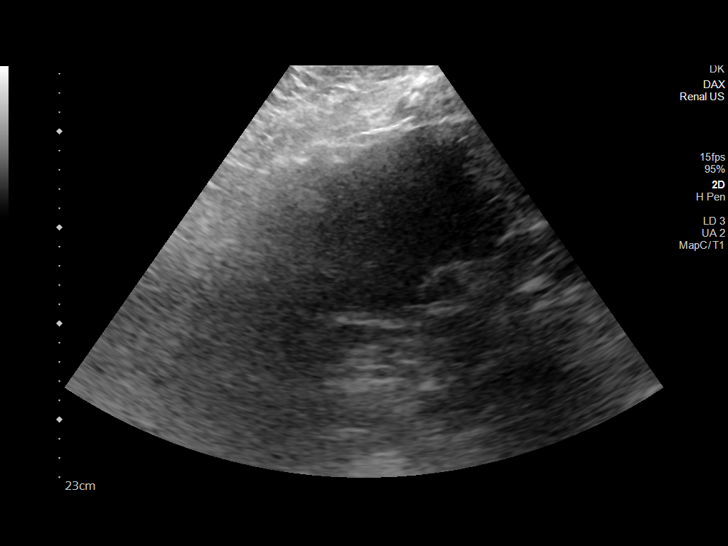
[im 15/19]
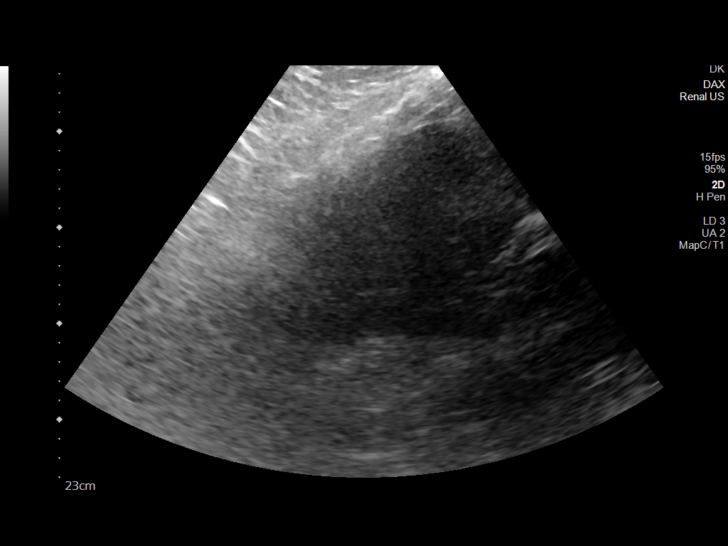
[im 16/19]
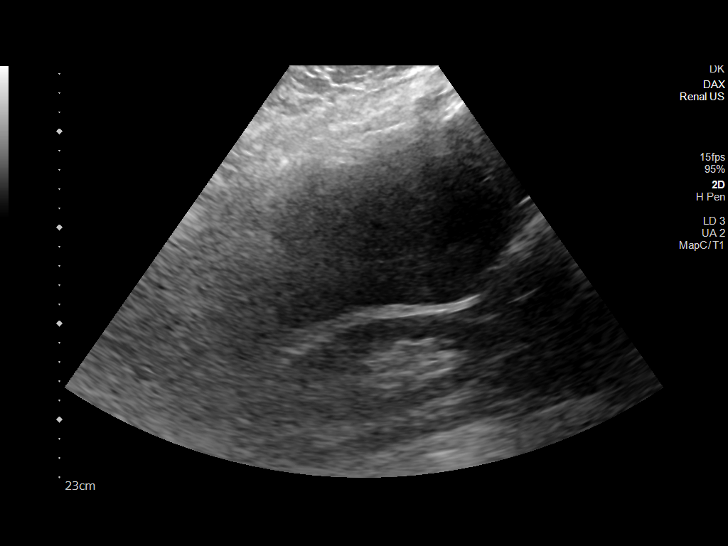
[im 17/19]
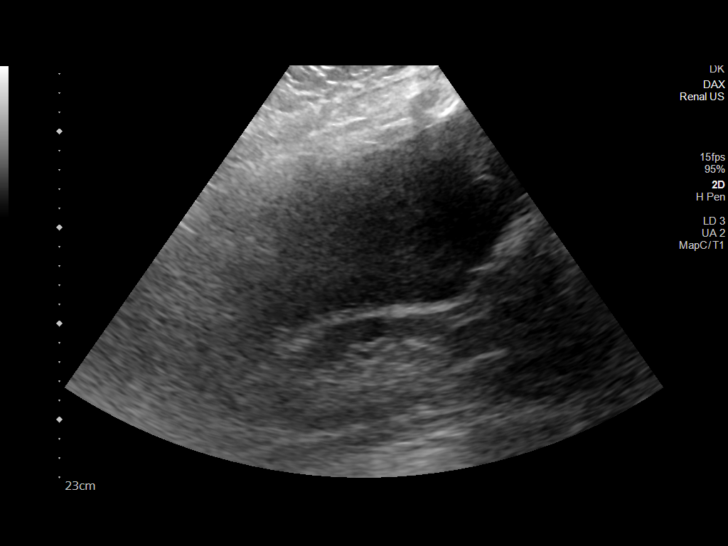
[im 19/19]
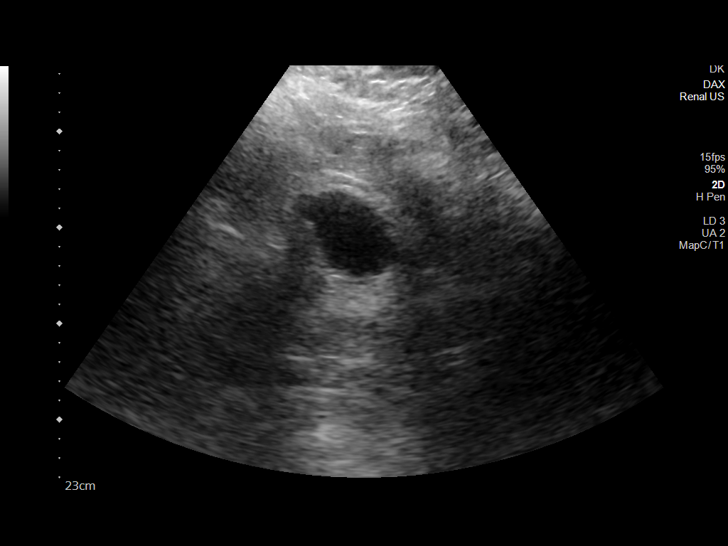

[14 of 19 positions shown; findings below may reference images not displayed]

FINDINGS: Right Kidney:

Renal measurements: 13.0 x 5.0 x 6.3 cm = volume: 205 mL .
Echogenicity within normal limits. No mass or hydronephrosis
visualized.

Left Kidney:

Renal measurements: 11.7 x 5.1 x 5.3 cm = volume: 164 mL.
Echogenicity within normal limits. No mass or hydronephrosis
visualized.

Bladder:

Appears normal for degree of bladder distention.

Other:

None.
IMPRESSION: Normal renal sonogram

## 2021-12-30 DIAGNOSIS — L97922 Non-pressure chronic ulcer of unspecified part of left lower leg with fat layer exposed: Secondary | ICD-10-CM | POA: Insufficient documentation

## 2021-12-30 DIAGNOSIS — I779 Disorder of arteries and arterioles, unspecified: Secondary | ICD-10-CM | POA: Insufficient documentation

## 2022-01-04 ENCOUNTER — Ambulatory Visit (INDEPENDENT_AMBULATORY_CARE_PROVIDER_SITE_OTHER): Payer: Medicare Other | Admitting: Cardiology

## 2022-01-04 ENCOUNTER — Encounter: Payer: Self-pay | Admitting: Cardiology

## 2022-01-04 VITALS — BP 120/70 | HR 64

## 2022-01-04 DIAGNOSIS — R0609 Other forms of dyspnea: Secondary | ICD-10-CM

## 2022-01-04 DIAGNOSIS — I10 Essential (primary) hypertension: Secondary | ICD-10-CM

## 2022-01-04 DIAGNOSIS — I480 Paroxysmal atrial fibrillation: Secondary | ICD-10-CM

## 2022-01-04 DIAGNOSIS — Z89611 Acquired absence of right leg above knee: Secondary | ICD-10-CM

## 2022-01-04 DIAGNOSIS — E1149 Type 2 diabetes mellitus with other diabetic neurological complication: Secondary | ICD-10-CM | POA: Diagnosis not present

## 2022-01-04 DIAGNOSIS — J449 Chronic obstructive pulmonary disease, unspecified: Secondary | ICD-10-CM

## 2022-01-04 NOTE — Progress Notes (Signed)
Cardiology Office Note:    Date:  01/04/2022   ID:  Matthew Gutierrez, DOB 1955/08/14, MRN [id number]  PCP:  Moreen Fowler, MD  Cardiologist:  Jenne Campus, MD    Referring MD: Lillard Anes,*   Chief Complaint  Patient presents with   Follow-up    Surgical Clearacne    History of Present Illness:    Matthew Gutierrez is a 66 y.o. male  with past medical history significant for paroxysmal atrial fibrillation, history of DVT, lymphedema, obesity, peripheral vascular disease, chronic kidney failure, history of coronary artery disease.  Last time I seen him was in September 2022, apparently reason for that was episode of atrial fibrillation.  He ended up going to the hospital because of confusion he was found to have cholecystitis colostomy tube was inserted and and also ERCP was performed.  Last time I seen him he was still have a tube in his gallbladder but now tube has been removed.  Echocardiogram performed in the hospital showed preserved left ventricle ejection fraction there is dilatation of her aorta measuring 39 mm. I have seen him last time in February.  Since that time he has been followed by vascular surgery and it looks like he required above-knee left leg amputation.  He was sent to [place] for cardiovascular evaluation he is bedridden, he does not walk.  He spent time sitting in the chair laying down in the bed denies have any cardiac complaints, there is there is no chest pain tightness pressure burning in the chest.  Denies having any palpitations.  Overall, difficult to get good story from him.  He does not answer question right away and usually gives a very vague answers.  Past Medical History:  Diagnosis Date   Absence epileptic syndrome, not intractable, without status epilepticus ([place]) 07/23/2019   Acute renal failure superimposed on stage 3 chronic kidney disease ([place]) 07/23/2019   Anemia, unspecified    Atherosclerotic heart disease 07/23/2019   Atrial fibrillation  with RVR ([place]) 12/14/2019   Benign enlargement of prostate 07/23/2019   Bipolar 1 disorder (HCC)    Body mass index 50.0-59.9, adult ([place]) 07/23/2019   Cellulitis of leg 12/14/2019   Chronic respiratory failure with hypoxia ([place]) 07/23/2019   Chronic venous hypertension (idiopathic) with ulcer of left lower extremity ([place]) 07/23/2019   COPD (chronic obstructive pulmonary disease) ([place]) 07/23/2019   Diabetic glomerulopathy ([place]) 10/15/2019   Diabetic neuropathy ([place]) 07/23/2019   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[place]) 10/15/2019   DVT (deep venous thrombosis) ([place])    Epilepsy, grand mal ([place])    Essential hypertension 01/22/2018   Gout attack 10/06/2019   H/O: stroke 01/22/2018   History of DVT (deep vein thrombosis) 01/22/2018   Long term (current) use of anticoagulants 07/23/2019   Lumbago    Lymphedema of both lower extremities 01/22/2018   Mixed hyperlipidemia 07/23/2019   Morbid obesity ([place]) 07/23/2019   Obstructive sleep apnea 07/23/2019   Paroxysmal atrial fibrillation ([place]) 01/07/2020   Peripheral vascular disease, unspecified ([place]) 07/12/2011   Schizophrenia ([place])    Secondary hypercoagulable state ([place]) 01/07/2020   Shoulder bursitis 07/25/2019   Stroke ([place])    Tremor    Type 2 diabetes mellitus ([place])     Past Surgical History:  Procedure Laterality Date   ABOVE KNEE LEG AMPUTATION Right    CYSTOSTOMY  01/07/2021   ERCP N/A 12/30/2020   Procedure: ENDOSCOPIC RETROGRADE CHOLANGIOPANCREATOGRAPHY (ERCP);  Surgeon: Ladene Artist, MD;  Location: [facility] ENDOSCOPY;  Service: Endoscopy;  Laterality:  N/A;   IR PERC CHOLECYSTOSTOMY  12/29/2020   REMOVAL OF STONES  12/30/2020   Procedure: REMOVAL OF STONES;  Surgeon: Ladene Artist, MD;  Location: [place];  Service: Endoscopy;;   SPHINCTEROTOMY  12/30/2020   Procedure: SPHINCTEROTOMY;  Surgeon: Ladene Artist, MD;  Location: [facility] ENDOSCOPY;  Service: Endoscopy;;    Current Medications: No outpatient medications have been  marked as taking for the 01/04/22 encounter (Office Visit) with Park Liter, MD.     Allergies:   Codeine   Social History   Socioeconomic History   Marital status: Divorced    Spouse name: Not on file   Number of children: 2   Years of education: Not on file   Highest education level: Not on file  Occupational History   Occupation: Disability  Tobacco Use   Smoking status: Former    Types: Cigarettes    Quit date: 10/28/2009    Years since quitting: 12.1   Smokeless tobacco: Never  Substance and Sexual Activity   Alcohol use: Not Currently    Comment: occassionally   Drug use: No   Sexual activity: Not Currently  Other Topics Concern   Not on file  Social History Narrative   09/15/21 [facility] resident   Social Determinants of Health   Financial Resource Strain: Not on file  Food Insecurity: Not on file  Transportation Needs: Not on file  Physical Activity: Inactive (07/19/2019)   Exercise Vital Sign    Days of Exercise per Week: 0 days    Minutes of Exercise per Session: 0 min  Stress: Not on file  Social Connections: Not on file     Family History: The patient's family history includes Alcohol abuse in an other family member; Cancer in his mother and another family member; Heart disease in an other family member; Hyperlipidemia in an other family member; Hypertension in an other family member; Stroke in his father and another family member. ROS:   Please see the history of present illness.    All 14 point review of systems negative except as described per history of present illness  EKGs/Labs/Other Studies Reviewed:      Recent Labs: 01/05/2021: ALT 27; BUN 27; Creatinine, Ser 1.39; Potassium 3.7; Sodium 132 01/07/2021: Hemoglobin 9.5; Platelets 174  Recent Lipid Panel    Component Value Date/Time   CHOL 205 (H) 03/12/2020 1202   TRIG 162 (H) 03/12/2020 1202   HDL 38 (L) 03/12/2020 1202   CHOLHDL 5.4 (H) 03/12/2020 1202   LDLCALC 138 (H)  03/12/2020 1202    Physical Exam:    VS:  BP 120/70 (BP Location: Right Arm, Patient Position: Supine)   Pulse 64   SpO2 93%     Wt Readings from Last 3 Encounters:  07/06/21 198 lb 3.2 oz (89.9 kg)  02/22/21 260 lb (117.9 kg)  01/07/21 255 lb 11.7 oz (116 kg)     GEN:  Well nourished, well developed in no acute distress HEENT: Normal NECK: No JVD; No carotid bruits LYMPHATICS: No lymphadenopathy CARDIAC: RRR, no murmurs, no rubs, no gallops RESPIRATORY:  Clear to auscultation without rales, wheezing or rhonchi  ABDOMEN: Soft, non-tender, non-distended MUSCULOSKELETAL:  No edema; No deformity  SKIN: Warm and dry LOWER EXTREMITIES: no swelling NEUROLOGIC:  Alert and oriented x 3 PSYCHIATRIC:  Normal affect   ASSESSMENT:    1. Essential hypertension   2. Paroxysmal atrial fibrillation (HCC)   3. Chronic obstructive pulmonary disease, unspecified COPD type ([place])  4. Other diabetic neurological complication associated with type 2 diabetes mellitus ([place])   5. S/P above knee amputation, right (HCC)    PLAN:    In order of problems listed above:  Cardiovascular valuation before the above left knee amputation.  I am trying to get in touch with his vascular surgeons and basically question I have is how desperately he needed surgery.  Ideally he need to have an echocardiogram performed as well as stress test try to assess if he gets significant coronary artery disease however if surgery is absolutely necessary with risk of gangrene and potential lethal complication of not performing surgery then we simply have to go ahead and proceed with surgery.  I will schedule him to have an echocardiogram I will try to get in touch with surgeon to ask those questions. Paroxysmal atrial fibrillation.  I will do EKG today to check the rhythm.  He is on Eliquis which I will continue. COPD.  Stable. Essential hypertension blood pressure seems to be well controlled.  I was able to get in touch  with his vascular surgeon Dr. Amalia Hailey primary question had to had was how desperately he needed surgery and also what can of anesthesia will be used for that surgery.  I was informed that it can be done in the regional anesthesia therefore I do not see any cardiac contraindication for that procedure.  I will still get echocardiogram to assess left ventricle ejection fraction to help with the management of fluids   Medication Adjustments/Labs and Tests Ordered: Current medicines are reviewed at length with the patient today.  Concerns regarding medicines are outlined above.  No orders of the defined types were placed in this encounter.  Medication changes: No orders of the defined types were placed in this encounter.   Signed, Park Liter, MD, [facility] 01/04/2022 2:20 PM    [facility]

## 2022-01-04 NOTE — Patient Instructions (Addendum)

## 2022-01-07 ENCOUNTER — Other Ambulatory Visit: Payer: Medicare Other

## 2022-01-25 ENCOUNTER — Ambulatory Visit: Payer: Medicare Other | Attending: Cardiology

## 2022-01-25 DIAGNOSIS — R0609 Other forms of dyspnea: Secondary | ICD-10-CM | POA: Diagnosis not present

## 2022-01-25 LAB — ECHOCARDIOGRAM COMPLETE
Area-P 1/2: 3.21 cm2
S' Lateral: 4.1 cm

## 2022-02-02 ENCOUNTER — Telehealth: Payer: Self-pay

## 2022-02-02 NOTE — Telephone Encounter (Signed)
-----   Message from Park Liter, MD sent at 01/31/2022  8:03 PM EDT ----- Echocardiogram showed preserved left ventricle ejection fraction, reduced left ventricle hypertrophy, mildly dilated left atrium, overall looks fine

## 2022-02-02 NOTE — Telephone Encounter (Signed)
I called, unable to reach or LM(phone continue to ring only. I mailed a letter requesting a call back

## 2022-02-09 ENCOUNTER — Telehealth: Payer: Self-pay | Admitting: Cardiology

## 2022-02-09 NOTE — Telephone Encounter (Signed)
Matthew Gutierrez from [facility] called requesting pt echo results to be faxed to [phone].  Please advise.

## 2022-07-04 ENCOUNTER — Ambulatory Visit: Payer: Medicare Other | Attending: Cardiology | Admitting: Cardiology

## 2022-07-04 ENCOUNTER — Encounter: Payer: Self-pay | Admitting: Cardiology

## 2022-07-04 VITALS — BP 168/76 | HR 73 | Ht <= 58 in | Wt 241.5 lb

## 2022-07-04 DIAGNOSIS — I10 Essential (primary) hypertension: Secondary | ICD-10-CM

## 2022-07-04 DIAGNOSIS — I480 Paroxysmal atrial fibrillation: Secondary | ICD-10-CM

## 2022-07-04 DIAGNOSIS — I252 Old myocardial infarction: Secondary | ICD-10-CM

## 2022-07-04 DIAGNOSIS — R0609 Other forms of dyspnea: Secondary | ICD-10-CM

## 2022-07-04 DIAGNOSIS — I25118 Atherosclerotic heart disease of native coronary artery with other forms of angina pectoris: Secondary | ICD-10-CM

## 2022-07-04 DIAGNOSIS — E785 Hyperlipidemia, unspecified: Secondary | ICD-10-CM

## 2022-07-04 NOTE — Progress Notes (Signed)
Cardiology Office Note:    Date:  07/04/2022   ID:  Matthew Gutierrez, DOB 1956-01-19, MRN [id number]  PCP:  Matthew Fowler, MD  Cardiologist:  Matthew Campus, MD    Referring MD: Matthew Fowler, MD   Chief Complaint  Patient presents with   Follow-up    History of Present Illness:    Matthew Gutierrez is a 67 y.o. male with very complex past medical history, that include paroxysmal atrial fibrillation, he is anticoagulated, history of DVT, anticoagulated, lymphedema, obesity, peripheral vascular disease, chronic kidney failure, history of remote coronary artery disease.  He is coming to my office for follow-up since I seen him last time he did have above-knee amputation on the left side.  He did quite well with this there was some reservation for him to have the procedure done but eventually he did have amputation done and doing quite well.  Cardiac wise denies having complaint is no palpitations no dizziness no passing out no chest pain tightness squeezing pressure burning chest.  Past Medical History:  Diagnosis Date   Absence epileptic syndrome, not intractable, without status epilepticus ([place]) 07/23/2019   Acute renal failure superimposed on stage 3 chronic kidney disease ([place]) 07/23/2019   Anemia, unspecified    Atherosclerotic heart disease 07/23/2019   Atrial fibrillation with RVR ([place]) 12/14/2019   Benign enlargement of prostate 07/23/2019   Bipolar 1 disorder ([place])    Body mass index 50.0-59.9, adult ([place]) 07/23/2019   Cellulitis of leg 12/14/2019   Chronic respiratory failure with hypoxia ([place]) 07/23/2019   Chronic venous hypertension (idiopathic) with ulcer of left lower extremity ([place]) 07/23/2019   COPD (chronic obstructive pulmonary disease) ([place]) 07/23/2019   Diabetic glomerulopathy ([place]) 10/15/2019   Diabetic neuropathy ([place]) 07/23/2019   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[place]) 10/15/2019   DVT (deep venous thrombosis) ([place])    Epilepsy, grand mal ([place])    Essential  hypertension 01/22/2018   Gout attack 10/06/2019   H/O: stroke 01/22/2018   History of DVT (deep vein thrombosis) 01/22/2018   Long term (current) use of anticoagulants 07/23/2019   Lumbago    Lymphedema of both lower extremities 01/22/2018   Mixed hyperlipidemia 07/23/2019   Morbid obesity ([place]) 07/23/2019   Obstructive sleep apnea 07/23/2019   Paroxysmal atrial fibrillation ([place]) 01/07/2020   Peripheral vascular disease, unspecified ([place]) 07/12/2011   Schizophrenia ([place])    Secondary hypercoagulable state ([place]) 01/07/2020   Shoulder bursitis 07/25/2019   Stroke ([place])    Tremor    Type 2 diabetes mellitus ([place])     Past Surgical History:  Procedure Laterality Date   ABOVE KNEE LEG AMPUTATION Right    CYSTOSTOMY  01/07/2021   ERCP N/A 12/30/2020   Procedure: ENDOSCOPIC RETROGRADE CHOLANGIOPANCREATOGRAPHY (ERCP);  Surgeon: Ladene Artist, MD;  Location: [facility] ENDOSCOPY;  Service: Endoscopy;  Laterality: N/A;   IR PERC CHOLECYSTOSTOMY  12/29/2020   REMOVAL OF STONES  12/30/2020   Procedure: REMOVAL OF STONES;  Surgeon: Ladene Artist, MD;  Location: [place];  Service: Endoscopy;;   SPHINCTEROTOMY  12/30/2020   Procedure: Joan Mayans;  Surgeon: Ladene Artist, MD;  Location[facility] ENDOSCOPY;  Service: Endoscopy;;    Current Medications: Current Meds  Medication Sig   acetaminophen (TYLENOL) 500 MG tablet Take 2 tablets by mouth every 6 (six) hours as needed for pain.   aspirin EC 81 MG tablet Take 1 tablet by mouth daily.   atorvastatin (LIPITOR) 10 MG tablet Take 10 mg by mouth daily.   benztropine (COGENTIN)  0.5 MG tablet Take 0.5 mg by mouth 2 (two) times daily.   Cholecalciferol (VITAMIN D3) 1.25 MG (50000 UT) TABS Take 50,000 Units by mouth 2 (two) times a week. Every Wed and Sat   divalproex (DEPAKOTE SPRINKLE) 125 MG capsule Take 500 mg by mouth 2 (two) times daily.   DULoxetine (CYMBALTA) 60 MG capsule Take 1 capsule by mouth daily.   ELIQUIS 5 MG TABS tablet  Take 5 mg by mouth 2 (two) times daily.   fluticasone (FLONASE) 50 MCG/ACT nasal spray Place 1 spray into both nostrils daily.   gabapentin (NEURONTIN) 300 MG capsule Take 1 capsule (300 mg total) by mouth at bedtime. (Patient taking differently: Take 300 mg by mouth 3 (three) times daily.)   ipratropium-albuterol (DUONEB) 0.5-2.5 (3) MG/3ML SOLN Take 3 mLs by nebulization every 6 (six) hours as needed (dyspnea wheezing).   OLANZapine (ZYPREXA) 5 MG tablet Take 5 mg by mouth at bedtime.   oxyCODONE (OXY IR/ROXICODONE) 5 MG immediate release tablet Take 5 mg by mouth every 8 (eight) hours as needed for pain.   phenytoin (DILANTIN) 100 MG ER capsule Take 1 capsule (100 mg total) by mouth 2 (two) times daily.   polyethylene glycol powder (GLYCOLAX/MIRALAX) 17 GM/SCOOP powder Take 1 Container by mouth every morning.   senna-docusate (SENOKOT-S) 8.6-50 MG tablet Take 2 tablets by mouth 2 (two) times daily.   [DISCONTINUED] doxepin (SINEQUAN) 10 MG/ML solution Take 0.3 mg by mouth daily. 0.3 ml daily     Allergies:   Codeine   Social History   Socioeconomic History   Marital status: Divorced    Spouse name: Not on file   Number of children: 2   Years of education: Not on file   Highest education level: Not on file  Occupational History   Occupation: Disability  Tobacco Use   Smoking status: Former    Types: Cigarettes    Quit date: 10/28/2009    Years since quitting: 12.6   Smokeless tobacco: Never  Substance and Sexual Activity   Alcohol use: Not Currently    Comment: occassionally   Drug use: No   Sexual activity: Not Currently  Other Topics Concern   Not on file  Social History Narrative   09/15/21 [facility] resident   Social Determinants of Health   Financial Resource Strain: Not on file  Food Insecurity: Not on file  Transportation Needs: Not on file  Physical Activity: Inactive (07/19/2019)   Exercise Vital Sign    Days of Exercise per Week: 0 days    Minutes of  Exercise per Session: 0 min  Stress: Not on file  Social Connections: Not on file     Family History: The patient's family history includes Alcohol abuse in an other family member; Cancer in his mother and another family member; Heart disease in an other family member; Hyperlipidemia in an other family member; Hypertension in an other family member; Stroke in his father and another family member. ROS:   Please see the history of present illness.    All 14 point review of systems negative except as described per history of present illness  EKGs/Labs/Other Studies Reviewed:      Recent Labs: No results found for requested labs within last 365 days.  Recent Lipid Panel    Component Value Date/Time   CHOL 205 (H) 03/12/2020 1202   TRIG 162 (H) 03/12/2020 1202   HDL 38 (L) 03/12/2020 1202   CHOLHDL 5.4 (H) 03/12/2020 1202  [place] 138 (H) 03/12/2020 1202    Physical Exam:    VS:  BP (!) 168/76 (BP Location: Right Arm, Patient Position: Sitting)   Pulse 73   Ht 3' 7"$  (1.092 m)   Wt 241 lb 8 oz (109.5 kg)   SpO2 (!) 85%   BMI 91.83 kg/m     Wt Readings from Last 3 Encounters:  07/04/22 241 lb 8 oz (109.5 kg)  07/06/21 198 lb 3.2 oz (89.9 kg)  02/22/21 260 lb (117.9 kg)     GEN:  Well nourished, well developed in no acute distress HEENT: Normal NECK: No JVD; No carotid bruits LYMPHATICS: No lymphadenopathy CARDIAC: RRR, no murmurs, no rubs, no gallops RESPIRATORY:  Clear to auscultation without rales, wheezing or rhonchi  ABDOMEN: Soft, non-tender, non-distended MUSCULOSKELETAL:  No edema; No deformity  SKIN: Warm and dry LOWER EXTREMITIES: no swelling NEUROLOGIC:  Alert and oriented x 3 PSYCHIATRIC:  Normal affect   ASSESSMENT:    1. Paroxysmal atrial fibrillation (HCC)   2. Essential hypertension   3. Atherosclerosis of native coronary artery of native heart with stable angina pectoris ([place])   4. H/O acute myocardial infarction    PLAN:    In order of  problems listed above:  Paroxysmal atrial fibrillation: Maintaining sinus rhythm, he is anticoagulant which I continue. History of myocardial infarction.  There is no new issues will suggest problem from CAD point of view.  He is on appropriate guideline directed medical therapy which I will continue. Dyslipidemia I did review his K PN which show me his LDL of 138 however that numbers from December 2021.  Now he is on Lipitor 10.  I will do direct LDL today to see if we need to augment his medical therapy.   Medication Adjustments/Labs and Tests Ordered: Current medicines are reviewed at length with the patient today.  Concerns regarding medicines are outlined above.  No orders of the defined types were placed in this encounter.  Medication changes: No orders of the defined types were placed in this encounter.   Signed, Park Liter, MD, [facility] 07/04/2022 1:17 PM    [place]

## 2022-07-04 NOTE — Patient Instructions (Addendum)
Medication Instructions:  Your physician recommends that you continue on your current medications as directed. Please refer to the Current Medication list given to you today.  *If you need a refill on your cardiac medications before your next appointment, please call your pharmacy*   Lab Work: Direct LDL- today If you have labs (blood work) drawn today and your tests are completely normal, you will receive your results only by: [place] (if you have MyChart) OR A paper copy in the mail If you have any lab test that is abnormal or we need to change your treatment, we will call you to review the results.   Testing/Procedures: Your physician has requested that you have an echocardiogram. Echocardiography is a painless test that uses sound waves to create images of your heart. It provides your doctor with information about the size and shape of your heart and how well your heart's chambers and valves are working. This procedure takes approximately one hour. There are no restrictions for this procedure. Please do NOT wear cologne, perfume, aftershave, or lotions (deodorant is allowed). Please arrive 15 minutes prior to your appointment time.    Follow-Up: At [facility], you and your health needs are our priority.  As part of our continuing mission to provide you with exceptional heart care, we have created designated Provider Care Teams.  These Care Teams include your primary Cardiologist (physician) and Advanced Practice Providers (APPs -  Physician Assistants and Nurse Practitioners) who all work together to provide you with the care you need, when you need it.  We recommend signing up for the patient portal called "MyChart".  Sign up information is provided on this After Visit Summary.  MyChart is used to connect with patients for Virtual Visits (Telemedicine).  Patients are able to view lab/test results, encounter notes, upcoming appointments, etc.  Non-urgent messages can be sent to  your provider as well.   To learn more about what you can do with MyChart, go to [email or web address].    Your next appointment:   6 month(s)  The format for your next appointment:   In Person  Provider:   Jenne Campus, MD    Other Instructions NA

## 2022-07-05 LAB — LDL CHOLESTEROL, DIRECT: LDL Direct: 108 mg/dL — ABNORMAL HIGH (ref 0–99)

## 2022-08-01 ENCOUNTER — Telehealth: Payer: Self-pay

## 2022-08-01 NOTE — Telephone Encounter (Signed)
Results and instruction notified to Matthew Gutierrez register nurse at [facility] where the patient resides. I also faxed lab results and instructions

## 2022-08-01 NOTE — Telephone Encounter (Signed)
-----   Message from Park Liter, MD sent at 07/07/2022  1:09 PM EST ----- Cholesterol still elevated.  Please double the dose of Lipitor to 20 mg daily

## 2022-12-12 ENCOUNTER — Ambulatory Visit: Payer: Medicare Other | Attending: Cardiology

## 2023-01-04 ENCOUNTER — Ambulatory Visit: Payer: Medicare Other | Admitting: Cardiology

## 2023-01-10 ENCOUNTER — Ambulatory Visit: Payer: Medicare Other

## 2023-01-27 ENCOUNTER — Encounter: Payer: Self-pay | Admitting: Cardiology

## 2023-01-27 ENCOUNTER — Ambulatory Visit: Payer: Medicare Other | Attending: Cardiology | Admitting: Cardiology

## 2023-01-27 VITALS — BP 110/60 | HR 80 | Ht <= 58 in | Wt 228.4 lb

## 2023-01-27 DIAGNOSIS — I480 Paroxysmal atrial fibrillation: Secondary | ICD-10-CM

## 2023-01-27 DIAGNOSIS — E119 Type 2 diabetes mellitus without complications: Secondary | ICD-10-CM | POA: Diagnosis not present

## 2023-01-27 DIAGNOSIS — I739 Peripheral vascular disease, unspecified: Secondary | ICD-10-CM

## 2023-01-27 DIAGNOSIS — J449 Chronic obstructive pulmonary disease, unspecified: Secondary | ICD-10-CM | POA: Diagnosis not present

## 2023-01-27 NOTE — Progress Notes (Signed)
Cardiology Office Note:    Date:  01/27/2023   ID:  Matthew Gutierrez, DOB January 16, 1956, MRN [id number]  PCP:  Mikki Harbor, MD  Cardiologist:  Gypsy Balsam, MD    Referring MD: Mikki Harbor, MD   Chief Complaint  Patient presents with   Follow-up    History of Present Illness:    Matthew Gutierrez is a 67 y.o. male with past medical history significant for paroxysmal atrial fibrillation he is anticoagulated, history of DVT, lymphedema, obesity, peripheral vascular disease, chronic kidney failure, below-knee amputation on the left above-knee amputation on the right, remote coronary artery disease.  Comes today to months for follow-up.  Overall he says he is doing fine but he is confused he sees him once on the ceiling.  Denies have any chest pain tightness squeezing pressure burning chest no palpitations dizziness but history is unreliable.  Past Medical History:  Diagnosis Date   Absence epileptic syndrome, not intractable, without status epilepticus (HCC) 07/23/2019   Acute renal failure superimposed on stage 3 chronic kidney disease (HCC) 07/23/2019   Anemia, unspecified    Atherosclerotic heart disease 07/23/2019   Atrial fibrillation with RVR (HCC) 12/14/2019   Benign enlargement of prostate 07/23/2019   Bipolar 1 disorder (HCC)    Body mass index 50.0-59.9, adult (HCC) 07/23/2019   Cellulitis of leg 12/14/2019   Chronic respiratory failure with hypoxia (HCC) 07/23/2019   Chronic venous hypertension (idiopathic) with ulcer of left lower extremity (HCC) 07/23/2019   COPD (chronic obstructive pulmonary disease) (HCC) 07/23/2019   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   Diabetic neuropathy (HCC) 07/23/2019   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HCC) 10/15/2019   DVT (deep venous thrombosis) (HCC)    Epilepsy, grand mal (HCC)    Essential hypertension 01/22/2018   Gout attack 10/06/2019   H/O: stroke 01/22/2018   History of DVT (deep vein thrombosis) 01/22/2018   Long term (current)  use of anticoagulants 07/23/2019   Lumbago    Lymphedema of both lower extremities 01/22/2018   Mixed hyperlipidemia 07/23/2019   Morbid obesity (HCC) 07/23/2019   Obstructive sleep apnea 07/23/2019   Paroxysmal atrial fibrillation (HCC) 01/07/2020   Peripheral vascular disease, unspecified (HCC) 07/12/2011   Schizophrenia (HCC)    Secondary hypercoagulable state (HCC) 01/07/2020   Shoulder bursitis 07/25/2019   Stroke (HCC)    Tremor    Type 2 diabetes mellitus (HCC)     Past Surgical History:  Procedure Laterality Date   ABOVE KNEE LEG AMPUTATION Right    CYSTOSTOMY  01/07/2021   ERCP N/A 12/30/2020   Procedure: ENDOSCOPIC RETROGRADE CHOLANGIOPANCREATOGRAPHY (ERCP);  Surgeon: Meryl Dare, MD;  Location: [facility] ENDOSCOPY;  Service: Endoscopy;  Laterality: N/A;   IR PERC CHOLECYSTOSTOMY  12/29/2020   REMOVAL OF STONES  12/30/2020   Procedure: REMOVAL OF STONES;  Surgeon: Meryl Dare, MD;  Location: [facility] ENDOSCOPY;  Service: Endoscopy;;   SPHINCTEROTOMY  12/30/2020   Procedure: Dennison Mascot;  Surgeon: Meryl Dare, MD;  Location: [facility] ENDOSCOPY;  Service: Endoscopy;;    Current Medications: Current Meds  Medication Sig   acetaminophen (TYLENOL) 500 MG tablet Take 2 tablets by mouth every 6 (six) hours as needed for pain.   aspirin EC 81 MG tablet Take 1 tablet by mouth daily.   atorvastatin (LIPITOR) 20 MG tablet Take 10 mg by mouth daily.   benztropine (COGENTIN) 0.5 MG tablet Take 0.5 mg by mouth 2 (two) times daily.   calcitRIOL (ROCALTROL) 0.5 MCG capsule Take 0.5 mcg by mouth daily.  Cholecalciferol (VITAMIN D3) 1.25 MG (50000 UT) TABS Take 50,000 Units by mouth 2 (two) times a week. Every Wed and Sat   clonazePAM (KLONOPIN) 0.5 MG tablet Take 0.5 mg by mouth 2 (two) times daily as needed for anxiety.   divalproex (DEPAKOTE SPRINKLE) 125 MG capsule Take 250-375 mg by mouth See admin instructions. 2-3 caps up to twice daily   Dulaglutide (TRULICITY) 0.75 MG/0.5ML  SOPN Inject 0.5 mg into the skin once a week. Wednesday   DULoxetine (CYMBALTA) 60 MG capsule Take 1 capsule by mouth daily.   ELIQUIS 5 MG TABS tablet Take 5 mg by mouth 2 (two) times daily.   fluticasone (FLONASE) 50 MCG/ACT nasal spray Place 1 spray into both nostrils daily.   fluticasone-salmeterol (ADVAIR) 250-50 MCG/ACT AEPB Inhale 1 puff into the lungs every 12 (twelve) hours.   gabapentin (NEURONTIN) 300 MG capsule Take 1 capsule (300 mg total) by mouth at bedtime. (Patient taking differently: Take 300-400 mg by mouth See admin instructions. Up to 3 times daily)   haloperidol (HALDOL) 5 MG tablet Take 5 mg by mouth every 8 (eight) hours as needed for agitation.   ketoconazole (NIZORAL) 2 % shampoo Apply 1 Application topically 2 (two) times a week. Mondays and Thursdays   loperamide (IMODIUM) 2 MG capsule Take 2 mg by mouth every 4 (four) hours as needed for diarrhea or loose stools.   LORazepam (ATIVAN) 0.5 MG tablet Take 0.5 mg by mouth every 12 (twelve) hours as needed for anxiety.   oxyCODONE (OXY IR/ROXICODONE) 5 MG immediate release tablet Take 5 mg by mouth every 8 (eight) hours as needed for pain.   polyethylene glycol powder (GLYCOLAX/MIRALAX) 17 GM/SCOOP powder Take 1 Container by mouth every morning.   saccharomyces boulardii (FLORASTOR) 250 MG capsule Take 250 mg by mouth 2 (two) times daily.   senna-docusate (SENOKOT-S) 8.6-50 MG tablet Take 2 tablets by mouth 2 (two) times daily.   torsemide (DEMADEX) 5 MG tablet Take 5 mg by mouth daily. Edema   traZODone (DESYREL) 50 MG tablet Take 50 mg by mouth at bedtime.   [DISCONTINUED] ipratropium-albuterol (DUONEB) 0.5-2.5 (3) MG/3ML SOLN Take 3 mLs by nebulization every 6 (six) hours as needed (dyspnea wheezing).   [DISCONTINUED] OLANZapine (ZYPREXA) 5 MG tablet Take 5 mg by mouth at bedtime.   [DISCONTINUED] phenytoin (DILANTIN) 100 MG ER capsule Take 1 capsule (100 mg total) by mouth 2 (two) times daily.     Allergies:   Codeine    Social History   Socioeconomic History   Marital status: Divorced    Spouse name: Not on file   Number of children: 2   Years of education: Not on file   Highest education level: Not on file  Occupational History   Occupation: Disability  Tobacco Use   Smoking status: Former    Current packs/day: 0.00    Types: Cigarettes    Quit date: 10/28/2009    Years since quitting: 13.2   Smokeless tobacco: Never  Substance and Sexual Activity   Alcohol use: Not Currently    Comment: occassionally   Drug use: No   Sexual activity: Not Currently  Other Topics Concern   Not on file  Social History Narrative   09/15/21 [facility] resident   Social Determinants of Health   Financial Resource Strain: Not on file  Food Insecurity: Not on file  Transportation Needs: Not on file  Physical Activity: Inactive (07/19/2019)   Exercise Vital Sign    Days of  Exercise per Week: 0 days    Minutes of Exercise per Session: 0 min  Stress: Not on file  Social Connections: Not on file     Family History: The patient's family history includes Alcohol abuse in an other family member; Cancer in his mother and another family member; Heart disease in an other family member; Hyperlipidemia in an other family member; Hypertension in an other family member; Stroke in his father and another family member. ROS:   Please see the history of present illness.    All 14 point review of systems negative except as described per history of present illness  EKGs/Labs/Other Studies Reviewed:         Recent Labs: No results found for requested labs within last 365 days.  Recent Lipid Panel    Component Value Date/Time   CHOL 205 (H) 03/12/2020 1202   TRIG 162 (H) 03/12/2020 1202   HDL 38 (L) 03/12/2020 1202   CHOLHDL 5.4 (H) 03/12/2020 1202   LDLCALC 138 (H) 03/12/2020 1202   LDLDIRECT 108 (H) 07/04/2022 1337    Physical Exam:    VS:  BP 110/60 (BP Location: Right Arm, Patient Position:  Sitting)   Pulse 80   Ht 3\' 7"  (1.092 m)   Wt 228 lb 6.4 oz (103.6 kg)   SpO2 90%   BMI 86.85 kg/m     Wt Readings from Last 3 Encounters:  01/27/23 228 lb 6.4 oz (103.6 kg)  07/04/22 241 lb 8 oz (109.5 kg)  07/06/21 198 lb 3.2 oz (89.9 kg)     GEN:  Well nourished, well developed in no acute distress HEENT: Normal NECK: No JVD; No carotid bruits LYMPHATICS: No lymphadenopathy CARDIAC: RRR, no murmurs, no rubs, no gallops RESPIRATORY:  Clear to auscultation without rales, wheezing or rhonchi  ABDOMEN: Soft, non-tender, non-distended MUSCULOSKELETAL:  No edema; No deformity  SKIN: Warm and dry LOWER EXTREMITIES: Amputation both sides NEUROLOGIC:  Alert and oriented x 3 PSYCHIATRIC:  Normal affect   ASSESSMENT:    1. Paroxysmal atrial fibrillation (HCC)   2. Peripheral vascular disease, unspecified (HCC)   3. Chronic obstructive pulmonary disease, unspecified COPD type (HCC)   4. Type 2 diabetes mellitus without complication, without long-term current use of insulin (HCC)    PLAN:    In order of problems listed above:  Paroxysmal atrial fibrillation, on Eliquis which I will continue. Peripheral vascular disease advanced, on antiplatelet therapy which I will continue. Chronic obstructive pulmonary disease stable from that point review.  Type 2 diabetes hemoglobin A1c 8.2 however this numbers from 2022 I do not have any more recent data.  Impossible for me to get good history from him he is confused seems to be comfortable we will continue present management.  He is cleared to have echocardiogram will wait for results of   Medication Adjustments/Labs and Tests Ordered: Current medicines are reviewed at length with the patient today.  Concerns regarding medicines are outlined above.  No orders of the defined types were placed in this encounter.  Medication changes: No orders of the defined types were placed in this encounter.   Signed, Georgeanna Lea, MD,  [facility] 01/27/2023 2:52 PM    [facility]

## 2023-01-27 NOTE — Patient Instructions (Signed)

## 2023-02-09 ENCOUNTER — Ambulatory Visit: Payer: Medicare Other | Attending: Cardiology

## 2023-02-09 DIAGNOSIS — R0609 Other forms of dyspnea: Secondary | ICD-10-CM | POA: Diagnosis not present

## 2023-02-09 LAB — ECHOCARDIOGRAM COMPLETE: S' Lateral: 3.9 cm

## 2023-02-28 ENCOUNTER — Telehealth: Payer: Self-pay

## 2023-02-28 NOTE — Telephone Encounter (Signed)
LM to return my call.  ?

## 2023-02-28 NOTE — Telephone Encounter (Signed)
-----   Message from Gypsy Balsam sent at 02/10/2023 12:18 PM EDT ----- Echocardiogram showed preserved left ventricle ejection fraction overall looks good left atrium is moderately enlarged medical therapy

## 2023-03-02 ENCOUNTER — Telehealth: Payer: Self-pay

## 2023-03-02 NOTE — Telephone Encounter (Signed)
-----   Message from [facility] Kingdavid Leinbach P sent at 02/28/2023  2:23 PM EDT -----  ----- Message ----- From: Georgeanna Lea, MD Sent: 02/10/2023  12:18 PM EDT To: Neena Rhymes, RN  Echocardiogram showed preserved left ventricle ejection fraction overall looks good left atrium is moderately enlarged medical therapy

## 2023-03-02 NOTE — Telephone Encounter (Signed)
Patient notified of results and verbalized understanding. Information faxed to nursing facility for continuation of care

## 2023-03-06 ENCOUNTER — Other Ambulatory Visit: Payer: Self-pay

## 2023-03-06 ENCOUNTER — Inpatient Hospital Stay (HOSPITAL_COMMUNITY)
Admission: EM | Admit: 2023-03-06 | Discharge: 2023-03-31 | DRG: 884 | Disposition: E | Payer: Medicare Other | Source: Skilled Nursing Facility | Attending: Family Medicine | Admitting: Family Medicine

## 2023-03-06 ENCOUNTER — Emergency Department (HOSPITAL_COMMUNITY): Payer: Medicare Other

## 2023-03-06 ENCOUNTER — Encounter (HOSPITAL_COMMUNITY): Payer: Self-pay | Admitting: Emergency Medicine

## 2023-03-06 DIAGNOSIS — G9340 Encephalopathy, unspecified: Secondary | ICD-10-CM | POA: Diagnosis present

## 2023-03-06 DIAGNOSIS — Z87891 Personal history of nicotine dependence: Secondary | ICD-10-CM

## 2023-03-06 DIAGNOSIS — Z7985 Long-term (current) use of injectable non-insulin antidiabetic drugs: Secondary | ICD-10-CM

## 2023-03-06 DIAGNOSIS — F0284 Dementia in other diseases classified elsewhere, unspecified severity, with anxiety: Secondary | ICD-10-CM | POA: Diagnosis present

## 2023-03-06 DIAGNOSIS — R29810 Facial weakness: Secondary | ICD-10-CM | POA: Diagnosis present

## 2023-03-06 DIAGNOSIS — I5032 Chronic diastolic (congestive) heart failure: Secondary | ICD-10-CM | POA: Diagnosis present

## 2023-03-06 DIAGNOSIS — Z6841 Body Mass Index (BMI) 40.0 and over, adult: Secondary | ICD-10-CM

## 2023-03-06 DIAGNOSIS — I2510 Atherosclerotic heart disease of native coronary artery without angina pectoris: Secondary | ICD-10-CM | POA: Diagnosis present

## 2023-03-06 DIAGNOSIS — E1151 Type 2 diabetes mellitus with diabetic peripheral angiopathy without gangrene: Secondary | ICD-10-CM | POA: Diagnosis present

## 2023-03-06 DIAGNOSIS — Z7901 Long term (current) use of anticoagulants: Secondary | ICD-10-CM

## 2023-03-06 DIAGNOSIS — R627 Adult failure to thrive: Secondary | ICD-10-CM | POA: Diagnosis present

## 2023-03-06 DIAGNOSIS — Z823 Family history of stroke: Secondary | ICD-10-CM

## 2023-03-06 DIAGNOSIS — N400 Enlarged prostate without lower urinary tract symptoms: Secondary | ICD-10-CM | POA: Diagnosis present

## 2023-03-06 DIAGNOSIS — J9611 Chronic respiratory failure with hypoxia: Secondary | ICD-10-CM | POA: Diagnosis present

## 2023-03-06 DIAGNOSIS — Z86718 Personal history of other venous thrombosis and embolism: Secondary | ICD-10-CM

## 2023-03-06 DIAGNOSIS — Z8249 Family history of ischemic heart disease and other diseases of the circulatory system: Secondary | ICD-10-CM

## 2023-03-06 DIAGNOSIS — Z7951 Long term (current) use of inhaled steroids: Secondary | ICD-10-CM

## 2023-03-06 DIAGNOSIS — Z7982 Long term (current) use of aspirin: Secondary | ICD-10-CM

## 2023-03-06 DIAGNOSIS — E1140 Type 2 diabetes mellitus with diabetic neuropathy, unspecified: Secondary | ICD-10-CM | POA: Diagnosis present

## 2023-03-06 DIAGNOSIS — E1165 Type 2 diabetes mellitus with hyperglycemia: Secondary | ICD-10-CM | POA: Diagnosis present

## 2023-03-06 DIAGNOSIS — I130 Hypertensive heart and chronic kidney disease with heart failure and stage 1 through stage 4 chronic kidney disease, or unspecified chronic kidney disease: Secondary | ICD-10-CM | POA: Diagnosis present

## 2023-03-06 DIAGNOSIS — N179 Acute kidney failure, unspecified: Secondary | ICD-10-CM | POA: Diagnosis present

## 2023-03-06 DIAGNOSIS — E66813 Obesity, class 3: Secondary | ICD-10-CM | POA: Diagnosis present

## 2023-03-06 DIAGNOSIS — E1122 Type 2 diabetes mellitus with diabetic chronic kidney disease: Secondary | ICD-10-CM | POA: Diagnosis present

## 2023-03-06 DIAGNOSIS — Z79899 Other long term (current) drug therapy: Secondary | ICD-10-CM

## 2023-03-06 DIAGNOSIS — R4701 Aphasia: Secondary | ICD-10-CM | POA: Diagnosis present

## 2023-03-06 DIAGNOSIS — R001 Bradycardia, unspecified: Secondary | ICD-10-CM | POA: Diagnosis not present

## 2023-03-06 DIAGNOSIS — R4182 Altered mental status, unspecified: Principal | ICD-10-CM | POA: Diagnosis present

## 2023-03-06 DIAGNOSIS — E782 Mixed hyperlipidemia: Secondary | ICD-10-CM | POA: Diagnosis present

## 2023-03-06 DIAGNOSIS — Z89612 Acquired absence of left leg above knee: Secondary | ICD-10-CM

## 2023-03-06 DIAGNOSIS — Z89611 Acquired absence of right leg above knee: Secondary | ICD-10-CM

## 2023-03-06 DIAGNOSIS — G40A09 Absence epileptic syndrome, not intractable, without status epilepticus: Secondary | ICD-10-CM | POA: Diagnosis present

## 2023-03-06 DIAGNOSIS — J449 Chronic obstructive pulmonary disease, unspecified: Secondary | ICD-10-CM | POA: Diagnosis present

## 2023-03-06 DIAGNOSIS — F319 Bipolar disorder, unspecified: Secondary | ICD-10-CM | POA: Diagnosis present

## 2023-03-06 DIAGNOSIS — Z515 Encounter for palliative care: Secondary | ICD-10-CM

## 2023-03-06 DIAGNOSIS — F209 Schizophrenia, unspecified: Secondary | ICD-10-CM | POA: Diagnosis present

## 2023-03-06 DIAGNOSIS — I469 Cardiac arrest, cause unspecified: Secondary | ICD-10-CM | POA: Diagnosis not present

## 2023-03-06 DIAGNOSIS — N1832 Chronic kidney disease, stage 3b: Secondary | ICD-10-CM | POA: Diagnosis present

## 2023-03-06 DIAGNOSIS — R1310 Dysphagia, unspecified: Secondary | ICD-10-CM | POA: Diagnosis present

## 2023-03-06 DIAGNOSIS — Z7401 Bed confinement status: Secondary | ICD-10-CM

## 2023-03-06 DIAGNOSIS — I480 Paroxysmal atrial fibrillation: Secondary | ICD-10-CM | POA: Diagnosis present

## 2023-03-06 DIAGNOSIS — G546 Phantom limb syndrome with pain: Secondary | ICD-10-CM | POA: Diagnosis present

## 2023-03-06 DIAGNOSIS — R29717 NIHSS score 17: Secondary | ICD-10-CM | POA: Diagnosis present

## 2023-03-06 DIAGNOSIS — G20C Parkinsonism, unspecified: Secondary | ICD-10-CM | POA: Diagnosis present

## 2023-03-06 DIAGNOSIS — Z8673 Personal history of transient ischemic attack (TIA), and cerebral infarction without residual deficits: Secondary | ICD-10-CM

## 2023-03-06 DIAGNOSIS — F0283 Dementia in other diseases classified elsewhere, unspecified severity, with mood disturbance: Principal | ICD-10-CM | POA: Diagnosis present

## 2023-03-06 DIAGNOSIS — G9341 Metabolic encephalopathy: Secondary | ICD-10-CM | POA: Diagnosis not present

## 2023-03-06 DIAGNOSIS — Z66 Do not resuscitate: Secondary | ICD-10-CM | POA: Diagnosis present

## 2023-03-06 LAB — COMPREHENSIVE METABOLIC PANEL
ALT: 17 U/L (ref 0–44)
AST: 26 U/L (ref 15–41)
Albumin: 3.7 g/dL (ref 3.5–5.0)
Alkaline Phosphatase: 40 U/L (ref 38–126)
Anion gap: 14 (ref 5–15)
BUN: 22 mg/dL (ref 8–23)
CO2: 26 mmol/L (ref 22–32)
Calcium: 9.4 mg/dL (ref 8.9–10.3)
Chloride: 101 mmol/L (ref 98–111)
Creatinine, Ser: 1.35 mg/dL — ABNORMAL HIGH (ref 0.61–1.24)
GFR, Estimated: 57 mL/min — ABNORMAL LOW (ref >60)
Glucose, Bld: 111 mg/dL — ABNORMAL HIGH (ref 70–99)
Potassium: 3.8 mmol/L (ref 3.5–5.1)
Sodium: 141 mmol/L (ref 135–145)
Total Bilirubin: 1.3 mg/dL — ABNORMAL HIGH (ref 0.3–1.2)
Total Protein: 7.8 g/dL (ref 6.5–8.1)

## 2023-03-06 LAB — CBC WITH DIFFERENTIAL/PLATELET
Abs Immature Granulocytes: 0.05 10*3/uL (ref 0.00–0.07)
Basophils Absolute: 0.1 10*3/uL (ref 0.0–0.1)
Basophils Relative: 1 %
Eosinophils Absolute: 0.2 10*3/uL (ref 0.0–0.5)
Eosinophils Relative: 1 %
HCT: 51.7 % (ref 39.0–52.0)
Hemoglobin: 16.2 g/dL (ref 13.0–17.0)
Immature Granulocytes: 0 %
Lymphocytes Relative: 16 %
Lymphs Abs: 1.9 10*3/uL (ref 0.7–4.0)
MCH: 30.3 pg (ref 26.0–34.0)
MCHC: 31.3 g/dL (ref 30.0–36.0)
MCV: 96.8 fL (ref 80.0–100.0)
Monocytes Absolute: 0.9 10*3/uL (ref 0.1–1.0)
Monocytes Relative: 8 %
Neutro Abs: 8.7 10*3/uL — ABNORMAL HIGH (ref 1.7–7.7)
Neutrophils Relative %: 74 %
Platelets: 159 10*3/uL (ref 150–400)
RBC: 5.34 MIL/uL (ref 4.22–5.81)
RDW: 13.6 % (ref 11.5–15.5)
WBC: 11.7 10*3/uL — ABNORMAL HIGH (ref 4.0–10.5)
nRBC: 0 % (ref 0.0–0.2)

## 2023-03-06 LAB — PHENYTOIN LEVEL, TOTAL: Phenytoin Lvl: <2.5 ug/mL — ABNORMAL LOW (ref 10.0–20.0)

## 2023-03-06 LAB — I-STAT CHEM 8, ED
BUN: 27 mg/dL — ABNORMAL HIGH (ref 8–23)
Calcium, Ion: 1.15 mmol/L (ref 1.15–1.40)
Chloride: 103 mmol/L (ref 98–111)
Creatinine, Ser: 1.2 mg/dL (ref 0.61–1.24)
Glucose, Bld: 108 mg/dL — ABNORMAL HIGH (ref 70–99)
HCT: 52 % (ref 39.0–52.0)
Hemoglobin: 17.7 g/dL — ABNORMAL HIGH (ref 13.0–17.0)
Potassium: 3.9 mmol/L (ref 3.5–5.1)
Sodium: 143 mmol/L (ref 135–145)
TCO2: 29 mmol/L (ref 22–32)

## 2023-03-06 LAB — APTT: aPTT: 35 s (ref 24–36)

## 2023-03-06 LAB — PROTIME-INR
INR: 1.3 — ABNORMAL HIGH (ref 0.8–1.2)
Prothrombin Time: 16 s — ABNORMAL HIGH (ref 11.4–15.2)

## 2023-03-06 LAB — VALPROIC ACID LEVEL: Valproic Acid Lvl: 39 ug/mL — ABNORMAL LOW (ref 50.0–100.0)

## 2023-03-06 MED ORDER — LORAZEPAM 2 MG/ML IJ SOLN
0.5000 mg | Freq: Four times a day (QID) | INTRAMUSCULAR | Status: DC | PRN
  Administered 2023-03-07 (×2): 0.5 mg via INTRAVENOUS
  Filled 2023-03-06 (×2): qty 1

## 2023-03-06 MED ORDER — ATORVASTATIN CALCIUM 10 MG PO TABS
20.0000 mg | ORAL_TABLET | Freq: Every day | ORAL | Status: DC
  Administered 2023-03-07: 20 mg via ORAL
  Filled 2023-03-06: qty 2

## 2023-03-06 MED ORDER — APIXABAN 5 MG PO TABS: 5.0000 mg | ORAL_TABLET | Freq: Two times a day (BID) | ORAL | Status: DC

## 2023-03-06 MED ORDER — DIVALPROEX SODIUM 125 MG PO CSDR: 250.0000 mg | DELAYED_RELEASE_CAPSULE | ORAL | Status: DC

## 2023-03-06 NOTE — Code Documentation (Signed)
Responded to Code Stroke called at 2142 for AMS/aphasia, LSN-Friday. Pt arrived at 2208, CBG-108, NIH-18 for drowsiness, LOC questions, R gaze preference, R facial droop, B extremities weakness, aphasia, and dysarthria, CT head-negative for acute changes. TNK not give-pt outside window. Plan MRI, metabolic w/o, and seizure w/o. Please do VS/neuro checks q2h x 12, then q4h.

## 2023-03-06 NOTE — ED Provider Notes (Signed)
[place] EMERGENCY DEPARTMENT AT [facility] Provider Note   CSN: [id number] Arrival date & time: 2023/03/07  2208     History  Chief Complaint  Patient presents with   Code Stroke    Matthew Gutierrez is a 67 y.o. male.  Patient is a 67 year old male who presents as a code stroke.  On chart review, he has a history of diabetes, chronic kidney disease, A-fib on Eliquis, COPD, peripheral vascular disease, seizure disorder.  Per EMS report, he has been having a decline over the last 2 or 3 days.  The staff at the nursing facility has noticed that he is less active than normal and not verbalizing.  He got markedly worse today.  He normally does converse normally and eats on his own per EMS.  He is status post bilateral AKA's.  Code stroke was activated because he is nonverbal.  However his last known normal was 2 days ago per EMS report.       Home Medications Prior to Admission medications   Medication Sig Start Date End Date Taking? Authorizing Provider  acetaminophen (TYLENOL) 500 MG tablet Take 2 tablets by mouth every 6 (six) hours as needed for pain. 02/01/22   [provider]  aspirin EC 81 MG tablet Take 1 tablet by mouth daily. 02/02/22   [provider]  atorvastatin (LIPITOR) 20 MG tablet Take 10 mg by mouth daily.    [provider]  benztropine (COGENTIN) 0.5 MG tablet Take 0.5 mg by mouth 2 (two) times daily. 02/01/22   [provider]  calcitRIOL (ROCALTROL) 0.5 MCG capsule Take 0.5 mcg by mouth daily.    [provider]  Cholecalciferol (VITAMIN D3) 1.25 MG (50000 UT) TABS Take 50,000 Units by mouth 2 (two) times a week. Every Wed and Sat    [provider]  clonazePAM (KLONOPIN) 0.5 MG tablet Take 0.5 mg by mouth 2 (two) times daily as needed for anxiety.    [provider]  divalproex (DEPAKOTE SPRINKLE) 125 MG capsule Take 250-375 mg by mouth See admin instructions. 2-3 caps up to twice daily 02/01/22    [provider]  Dulaglutide (TRULICITY) 0.75 MG/0.5ML SOPN Inject 0.5 mg into the skin once a week. Wednesday    [provider]  DULoxetine (CYMBALTA) 60 MG capsule Take 1 capsule by mouth daily. 04/01/21   [provider]  ELIQUIS 5 MG TABS tablet Take 5 mg by mouth 2 (two) times daily. 07/20/21   [provider]  fluticasone (FLONASE) 50 MCG/ACT nasal spray Place 1 spray into both nostrils daily. 10/30/19   Abigail Miyamoto, MD  fluticasone-salmeterol (ADVAIR) 250-50 MCG/ACT AEPB Inhale 1 puff into the lungs every 12 (twelve) hours.    [provider]  gabapentin (NEURONTIN) 300 MG capsule Take 1 capsule (300 mg total) by mouth at bedtime. Patient taking differently: Take 300-400 mg by mouth See admin instructions. Up to 3 times daily 11/18/19   Abigail Miyamoto, MD  haloperidol (HALDOL) 5 MG tablet Take 5 mg by mouth every 8 (eight) hours as needed for agitation.    [provider]  ketoconazole (NIZORAL) 2 % shampoo Apply 1 Application topically 2 (two) times a week. Mondays and Thursdays    [provider]  loperamide (IMODIUM) 2 MG capsule Take 2 mg by mouth every 4 (four) hours as needed for diarrhea or loose stools.    [provider]  LORazepam (ATIVAN) 0.5 MG tablet Take 0.5 mg by  mouth every 12 (twelve) hours as needed for anxiety.    [provider]  oxyCODONE (OXY IR/ROXICODONE) 5 MG immediate release tablet Take 5 mg by mouth every 8 (eight) hours as needed for pain. 06/10/22   [provider]  polyethylene glycol powder (GLYCOLAX/MIRALAX) 17 GM/SCOOP powder Take 1 Container by mouth every morning.    [provider]  saccharomyces boulardii (FLORASTOR) 250 MG capsule Take 250 mg by mouth 2 (two) times daily.    [provider]  senna-docusate (SENOKOT-S) 8.6-50 MG tablet Take 2 tablets by mouth 2 (two) times daily.    [provider]  torsemide (DEMADEX) 5 MG  tablet Take 5 mg by mouth daily. Edema    [provider]  traZODone (DESYREL) 50 MG tablet Take 50 mg by mouth at bedtime.    [provider]      Allergies    Codeine    Review of Systems   Review of Systems  Unable to perform ROS: Mental status change    Physical Exam Updated Vital Signs BP (!) 162/117   Pulse 96   Temp 97.7 F (36.5 C) (Oral)   Resp (!) 22   Wt 93.6 kg   SpO2 97%   BMI 78.46 kg/m  Physical Exam Constitutional:      Appearance: He is well-developed.  HENT:     Head: Normocephalic and atraumatic.  Eyes:     Pupils: Pupils are equal, round, and reactive to light.  Cardiovascular:     Rate and Rhythm: Regular rhythm. Tachycardia present.     Heart sounds: Normal heart sounds.  Pulmonary:     Effort: Pulmonary effort is normal. No respiratory distress.     Breath sounds: Normal breath sounds. No wheezing or rales.  Chest:     Chest wall: No tenderness.  Abdominal:     General: Bowel sounds are normal.     Palpations: Abdomen is soft.     Tenderness: There is no abdominal tenderness. There is no guarding or rebound.  Musculoskeletal:        General: Normal range of motion.     Cervical back: Normal range of motion and neck supple.  Lymphadenopathy:     Cervical: No cervical adenopathy.  Skin:    General: Skin is warm and dry.     Findings: No rash.  Neurological:     Mental Status: He is alert.     Comments: Sometimes patient will answer questions with short answers such as tell me his name but other times he does not answer questions.  He is not following commands.  He is awake with his eyes open.  He is not following commands but does seem to be moving both of his upper extremities symmetrically.  He has a slight tremor in both of his arms.     ED Results / Procedures / Treatments   Labs (all labs ordered are listed, but only abnormal results are displayed) Labs Reviewed  VALPROIC ACID LEVEL - Abnormal; Notable for the  following components:      Result Value   Valproic Acid Lvl 39 (*)    All other components within normal limits  PHENYTOIN LEVEL, TOTAL - Abnormal; Notable for the following components:   Phenytoin Lvl <2.5 (*)    All other components within normal limits  COMPREHENSIVE METABOLIC PANEL - Abnormal; Notable for the following components:   Glucose, Bld 111 (*)    Creatinine, Ser 1.35 (*)    Total  Bilirubin 1.3 (*)    GFR, Estimated 57 (*)    All other components within normal limits  CBC WITH DIFFERENTIAL/PLATELET - Abnormal; Notable for the following components:   WBC 11.7 (*)    Neutro Abs 8.7 (*)    All other components within normal limits  PROTIME-INR - Abnormal; Notable for the following components:   Prothrombin Time 16.0 (*)    INR 1.3 (*)    All other components within normal limits  I-STAT CHEM 8, ED - Abnormal; Notable for the following components:   BUN 27 (*)    Glucose, Bld 108 (*)    Hemoglobin 17.7 (*)    All other components within normal limits  APTT  URINALYSIS, ROUTINE W REFLEX MICROSCOPIC  RAPID URINE DRUG SCREEN, HOSP PERFORMED  I-STAT CG4 LACTIC ACID, ED    EKG None  Radiology CT HEAD CODE STROKE WO CONTRAST  Result Date: 03/03/2023 CLINICAL DATA:  Code stroke. Initial evaluation for neuro deficit, stroke suspected. EXAM: CT HEAD WITHOUT CONTRAST TECHNIQUE: Contiguous axial images were obtained from the base of the skull through the vertex without intravenous contrast. RADIATION DOSE REDUCTION: This exam was performed according to the departmental dose-optimization program which includes automated exposure control, adjustment of the mA and/or kV according to patient size and/or use of iterative reconstruction technique. COMPARISON:  Prior study from 10/21/2021 FINDINGS: Brain: Examination degraded by motion and streak artifact. Age-related cerebral atrophy with mild chronic microvascular ischemic disease. No acute intracranial hemorrhage. No acute large  vessel territory infarct. No mass lesion or midline shift. No hydrocephalus or extra-axial fluid collection. Vascular: No abnormal hyperdense vessel. Calcified atherosclerosis present at the skull base. Skull: Scalp soft tissues within normal limits.  Calvarium intact. Sinuses/Orbits: Globes and orbital soft tissues grossly within normal limits. Visualized paranasal sinuses are clear. Small chronic appearing right mastoid effusion noted, of doubtful significance. Other: None. ASPECTS [facility] Stroke Program Early CT Score) - Ganglionic level infarction (caudate, lentiform nuclei, internal capsule, insula, M1-M3 cortex): 7 - Supraganglionic infarction (M4-M6 cortex): 3 Total score (0-10 with 10 being normal): 10 IMPRESSION: 1. No acute intracranial abnormality. 2. ASPECTS is 10. 3. Age-related cerebral atrophy with mild chronic small vessel ischemic disease. These results were communicated to Dr. Iver Nestle at 10:23 pm on 03/27/2023 by text page via the [facility] messaging system. Electronically Signed   By: Rise Mu M.D.   On: 03/10/2023 22:24    Procedures Procedures    Medications Ordered in ED Medications - No data to display  ED Course/ Medical Decision Making/ A&P                                 Medical Decision Making Amount and/or Complexity of Data Reviewed Radiology: ordered.  Risk Decision regarding hospitalization.   Patient is a 67 year old male who presents with altered mental status.  He initially presented as a code stroke due to his inability to communicate.  However it was confirmed that his last known normal was over 48 hours ago.  Per report, he is not conversing like he normally does and is not feeding himself like he normally does.  Head CT shows no acute abnormality.  No intracranial hemorrhage.  No obvious stroke.  He does not have any obvious deficits on exam.  Labs reviewed and are nonconcerning.  Urine is still pending.  MRI has been ordered by neurology but is  pending.  They also recommend EEG.  He has been seen by neurology.  He does not meet criteria for thrombolytics or thrombectomy.  Will consult hospitalist for admission for his altered mental status.  I spoke with Dr. Margo Aye who will admit the patient for further treatment.  Final Clinical Impression(s) / ED Diagnoses Final diagnoses:  Altered mental status, unspecified altered mental status type    Rx / DC Orders ED Discharge Orders     None         Rolan Bucco, MD 03/20/2023 2340

## 2023-03-06 NOTE — ED Triage Notes (Signed)
Patient BIB EMS as CODE Stroke.  LKW was Friday at 1500 per CNA at facility.  RN recalls that symptoms started around 1500 today.  At baseline patient is normally alert, oriented x 4, able to communicate, and able to feed himself.  Patient is only responding to pain at this time.  Hx of CVA, DM2.

## 2023-03-06 NOTE — Consult Note (Signed)
Neurology Consultation Reason for Consult: Altered mental status Requesting Physician: Rolan Bucco  CC: Reduced responsiveness  History is obtained from: EMS team and chart review  HPI: Matthew Gutierrez is a 67 y.o. male with a past medical history significant for paroxysmal atrial fibrillation, CKD stage IIIb, diabetes, hypertension, hyperlipidemia, coronary artery disease, morbid obesity, DVTs, epilepsy, mood disorder (bipolar disorder/anxiety/depression/dementia related behavioral disturbance), bilateral AKA's, parkinsonian tremor, DNR CODE STATUS  Per his nurse since 1500 he had a decline in his overall level of communication with increased lethargy, no focal deficits.  However per CNA he has been declining since last Friday.  EMS notes his speech has been mumbling and he has been confused but otherwise has had no focal deficits.  At baseline he is conversant and feeds himself with a weighted spoon but otherwise dependent for all of his care; facility notes suggest diagnosis of dementia as well  Notable medications on review of facility notes Abilify 10 mg 10 AM daily Depakote 250 mg at 1 PM daily Aspirin 81 mg daily, atorvastatin 20 mg daily Gabapentin 300 mg at 9 AM and 1300 and at 2100 with possibly an additional 400 mg at 2100 Duloxetine 60 mg at 8 AM daily Torsemide Trazodone 50 mg at 2100 daily Clonazepam 0.5 mg twice daily as needed, last dose 9 AM Tuesday 10/1 Tylenol 1000 mg every 6 hours scheduled with the last dose the afternoon prior to admission  He was previously evaluated by outpatient neurology and noted to have a parkinsonian tremor possibly secondary to vascular insults or with contribution from Risperdal (which does not appear to be a current medication on his MAR), and his seizures were noted to be remote and well-controlled on phenytoin  LKW: Unclear, either 10/4 or 1500 today Thrombolytic given?: No, out of the window and Eliquis last this morning IA performed?:  No, not a candidate due to baseline functional status and exam not consistent with LVO Premorbid modified rankin scale: 4-5     4 - Moderately severe disability. Unable to attend to own bodily needs without assistance, and unable to walk unassisted.     5 - Severe disability. Requires constant nursing care and attention, bedridden, incontinent.  ROS: Unable to obtain due to altered mental status.  He does state some simple things "I am in the hospital" "how are you doing"  Past Medical History:  Diagnosis Date   Absence epileptic syndrome, not intractable, without status epilepticus (HCC) 07/23/2019   Acute renal failure superimposed on stage 3 chronic kidney disease (HCC) 07/23/2019   Anemia, unspecified    Atherosclerotic heart disease 07/23/2019   Atrial fibrillation with RVR (HCC) 12/14/2019   Benign enlargement of prostate 07/23/2019   Bipolar 1 disorder (HCC)    Body mass index 50.0-59.9, adult (HCC) 07/23/2019   Cellulitis of leg 12/14/2019   Chronic respiratory failure with hypoxia (HCC) 07/23/2019   Chronic venous hypertension (idiopathic) with ulcer of left lower extremity (HCC) 07/23/2019   COPD (chronic obstructive pulmonary disease) (HCC) 07/23/2019   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   Diabetic neuropathy (HCC) 07/23/2019   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HCC) 10/15/2019   DVT (deep venous thrombosis) (HCC)    Epilepsy, grand mal (HCC)    Essential hypertension 01/22/2018   Gout attack 10/06/2019   H/O: stroke 01/22/2018   History of DVT (deep vein thrombosis) 01/22/2018   Long term (current) use of anticoagulants 07/23/2019   Lumbago    Lymphedema of both lower extremities 01/22/2018   Mixed hyperlipidemia 07/23/2019  Morbid obesity (HCC) 07/23/2019   Obstructive sleep apnea 07/23/2019   Paroxysmal atrial fibrillation (HCC) 01/07/2020   Peripheral vascular disease, unspecified (HCC) 07/12/2011   Schizophrenia (HCC)    Secondary hypercoagulable state (HCC)  01/07/2020   Shoulder bursitis 07/25/2019   Stroke [facility])    Tremor    Type 2 diabetes mellitus (HCC)    Past Surgical History:  Procedure Laterality Date   ABOVE KNEE LEG AMPUTATION Right    CYSTOSTOMY  01/07/2021   ERCP N/A 12/30/2020   Procedure: ENDOSCOPIC RETROGRADE CHOLANGIOPANCREATOGRAPHY (ERCP);  Surgeon: Meryl Dare, MD;  Location: [facility] ENDOSCOPY;  Service: Endoscopy;  Laterality: N/A;   IR PERC CHOLECYSTOSTOMY  12/29/2020   REMOVAL OF STONES  12/30/2020   Procedure: REMOVAL OF STONES;  Surgeon: Meryl Dare, MD;  Location: [facility] ENDOSCOPY;  Service: Endoscopy;;   SPHINCTEROTOMY  12/30/2020   Procedure: Dennison Mascot;  Surgeon: Meryl Dare, MD;  Location: [facility] ENDOSCOPY;  Service: Endoscopy;;     Family History  Problem Relation Age of Onset   Cancer Mother    Stroke Father    Hypertension Other    Stroke Other    Hyperlipidemia Other    Alcohol abuse Other    Heart disease Other    Cancer Other     Social History:  reports that he quit smoking about 13 years ago. His smoking use included cigarettes. He has never used smokeless tobacco. He reports that he does not currently use alcohol. He reports that he does not use drugs.   Exam: Current vital signs: BP (!) 162/117   Pulse 96   Temp 97.7 F (36.5 C) (Oral)   Resp (!) 22   Wt 93.6 kg   SpO2 97%   BMI 78.46 kg/m  Vital signs in last 24 hours:     Physical Exam  Constitutional: Appears chronically ill Psych: At times poorly interactive.  At times speaks clearly to examiner but very tangentially Eyes: No scleral injection HENT: Poor dentition MSK: Bilateral AKA's Cardiovascular: Perfusing extremities Respiratory: Effort normal, non-labored breathing GI: Soft.  No distension. There is no tenderness.  Skin: Scattered bruising on skin.  Neuro: Mental Status: Patient is awake, alert, oriented to person, place, but does not answer other orientation questions and gives very limited  history Cranial Nerves: II: Visual Fields are full to blink to threat bilaterally. Pupils are equal, round, and reactive to light.   III,IV, VI: Gaze is bilaterally.  Possible right gaze preference at times V: Facial sensation is symmetric to light eyelash brush VII: Facial movement is notable for widening of the right palpebral fissure and subtle right facial droop VIII: hearing is intact to voice X: Uvula difficult to visualize due to participation Motor: Pill-rolling tremor of bilateral upper extremities more notable on the left than the right.  Bilateral upper extremities drift quickly to the bed but do have some antigravity effort.  Does not lift bilateral lower extremities (AKAs) to command but is noted to lift them when repositioning himself spontaneously Sensory: Grossly equally reactive to touch in all 4 extremities Cerebellar: Does not participate Gait:  Deferred  NIHSS total 17 Score breakdown: 2 points for not answering questions, 1 point for right gaze preference, 1 point for right facial droop, 2 points for left arm drift, 2 points for right arm drift, 3 points for left leg drift, 3 points for right leg drift, 2 points for severe aphasia versus toxic/metabolic encephalopathy, 1 point for dysarthria Performed at time of patient arrival  to ED    I have reviewed labs in epic and the results pertinent to this consultation are:  Basic Metabolic Panel: Recent Labs  Lab 03/02/2023 24-Apr-2211 03/27/2023 2213  NA 141 143  K 3.8 3.9  CL 101 103  CO2 26  --   GLUCOSE 111* 108*  BUN 22 27*  CREATININE 1.35* 1.20  CALCIUM 9.4  --     CBC: Recent Labs  Lab 03/22/2023 2211-04-24 03/24/2023 2213  WBC 11.7*  --   NEUTROABS 8.7*  --   HGB 16.2 17.7*  HCT 51.7 52.0  MCV 96.8  --   PLT 159  --     Coagulation Studies: Recent Labs    03/17/2023 24-Apr-2211  LABPROT 16.0*  INR 1.3*      I have reviewed the images obtained:  Head CT personally reviewed, agree with radiology no acute  intracranial process   Impression: Toxic/metabolic versus possible behavioral issue / dementia related fluctuation.  Please note that his chronic Tylenol use could be masking a fever, however examination overall is not highly concerning for meningitis. Given history of seizure and discontinuation of prior phenytoin, I do think it is prudent to obtain EEG. Given history of stroke, also reasonable to obtain MRI (w/ and w/o to also cover seizure workup)  Recommendations: - Phenytoin level (recommended emergently based on initial chart review but he does not seem to be on this medication anymore) - Depakote level (resulted 39); okay to continue home depakote dose at this time - Ensure gabapentin is appropriately dosed for his renal function, typically no more than 900 mg a day in 2-3 divided doses is recommended with his creatinine clearance Estimated Creatinine Clearance: 36.7 mL/min (by C-G formula based on SCr of 1.2 mg/dL).  - Would hold off on lumbar puncture given Eliquis last dose this morning; hold Eliquis pending MRI - MRI brain with and without contrast to confirm no acute intracranial process (noting in the past he did not tolerate full MRI scan and may only tolerate limited sequences) - Routine EEG - Toxic/metabolic/infectious workup per ED, antibiotic coverage per ED - Neurology will follow up EEG and MRI   Brooke Dare MD-PhD [facility] [phone] Available 7 PM to 7 AM, outside of these hours please call Neurologist on call as listed on Amion.   Total critical care time: 35 minutes   Critical care time was exclusive of separately billable procedures and treating other patients.   Critical care was necessary to treat or prevent imminent or life-threatening deterioration   Critical care was time spent personally by me on the following activities: development of treatment plan with patient and/or surrogate as well as nursing, discussions with consultants/primary  team, evaluation of patient's response to treatment, examination of patient, obtaining history from patient or surrogate, ordering and performing treatments and interventions, ordering and review of laboratory studies, ordering and review of radiographic studies, and re-evaluation of patient's condition as needed, as documented above.

## 2023-03-06 NOTE — H&P (Signed)
History and Physical  Savvas Shamberger ZOX:[id number] DOB: 31-Jul-1955 DOA: 03/28/2023  Referring physician: Dr. Fredderick Phenix, EDP  PCP: Mikki Harbor, MD  Outpatient Specialists: Cardiology, vascular surgery. Patient coming from: SNF.  Chief Complaint: Code stroke  HPI: Augustino Shiers is a 67 y.o. male with medical history significant for paroxysmal A-fib on Eliquis, peripheral artery disease status post bilateral lower extremity amputation, hyperlipidemia, seizure disorder, who presented as a code stroke from SNF.  The patient had not been conversing over the past few days.  Reportedly there was concern for aphasia.  Upon presentation to the ED, noncontrast head CT was nonacute.  Seen by neurology/stroke team with recommendation for MRI, EEG, and Depakote level. The patient was able to converse in the ED.      ED Course: ***  Review of Systems: Review of systems as noted in the HPI. All other systems reviewed and are negative.   Past Medical History:  Diagnosis Date   Absence epileptic syndrome, not intractable, without status epilepticus (HCC) 07/23/2019   Acute renal failure superimposed on stage 3 chronic kidney disease (HCC) 07/23/2019   Anemia, unspecified    Atherosclerotic heart disease 07/23/2019   Atrial fibrillation with RVR (HCC) 12/14/2019   Benign enlargement of prostate 07/23/2019   Bipolar 1 disorder (HCC)    Body mass index 50.0-59.9, adult (HCC) 07/23/2019   Cellulitis of leg 12/14/2019   Chronic respiratory failure with hypoxia (HCC) 07/23/2019   Chronic venous hypertension (idiopathic) with ulcer of left lower extremity (HCC) 07/23/2019   COPD (chronic obstructive pulmonary disease) (HCC) 07/23/2019   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   Diabetic neuropathy (HCC) 07/23/2019   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HCC) 10/15/2019   DVT (deep venous thrombosis) (HCC)    Epilepsy, grand mal (HCC)    Essential hypertension 01/22/2018   Gout attack 10/06/2019   H/O:  stroke 01/22/2018   History of DVT (deep vein thrombosis) 01/22/2018   Long term (current) use of anticoagulants 07/23/2019   Lumbago    Lymphedema of both lower extremities 01/22/2018   Mixed hyperlipidemia 07/23/2019   Morbid obesity (HCC) 07/23/2019   Obstructive sleep apnea 07/23/2019   Paroxysmal atrial fibrillation (HCC) 01/07/2020   Peripheral vascular disease, unspecified (HCC) 07/12/2011   Schizophrenia (HCC)    Secondary hypercoagulable state (HCC) 01/07/2020   Shoulder bursitis 07/25/2019   Stroke (HCC)    Tremor    Type 2 diabetes mellitus (HCC)    Past Surgical History:  Procedure Laterality Date   ABOVE KNEE LEG AMPUTATION Right    CYSTOSTOMY  01/07/2021   ERCP N/A 12/30/2020   Procedure: ENDOSCOPIC RETROGRADE CHOLANGIOPANCREATOGRAPHY (ERCP);  Surgeon: Meryl Dare, MD;  Location: [facility] ENDOSCOPY;  Service: Endoscopy;  Laterality: N/A;   IR PERC CHOLECYSTOSTOMY  12/29/2020   REMOVAL OF STONES  12/30/2020   Procedure: REMOVAL OF STONES;  Surgeon: Meryl Dare, MD;  Location: [facility] ENDOSCOPY;  Service: Endoscopy;;   SPHINCTEROTOMY  12/30/2020   Procedure: Dennison Mascot;  Surgeon: Meryl Dare, MD;  Location: [facility] ENDOSCOPY;  Service: Endoscopy;;    Social History:  reports that he quit smoking about 13 years ago. His smoking use included cigarettes. He has never used smokeless tobacco. He reports that he does not currently use alcohol. He reports that he does not use drugs.   Allergies  Allergen Reactions   Codeine Other (See Comments)    Unknown Reaction    Family History  Problem Relation Age of Onset   Cancer Mother    Stroke Father  Hypertension Other    Stroke Other    Hyperlipidemia Other    Alcohol abuse Other    Heart disease Other    Cancer Other     ***  Prior to Admission medications   Medication Sig Start Date End Date Taking? Authorizing Provider  acetaminophen (TYLENOL) 500 MG tablet Take 2 tablets by mouth every 6 (six) hours as  needed for pain. 02/01/22   [provider]  aspirin EC 81 MG tablet Take 1 tablet by mouth daily. 02/02/22   [provider]  atorvastatin (LIPITOR) 20 MG tablet Take 10 mg by mouth daily.    [provider]  benztropine (COGENTIN) 0.5 MG tablet Take 0.5 mg by mouth 2 (two) times daily. 02/01/22   [provider]  calcitRIOL (ROCALTROL) 0.5 MCG capsule Take 0.5 mcg by mouth daily.    [provider]  Cholecalciferol (VITAMIN D3) 1.25 MG (50000 UT) TABS Take 50,000 Units by mouth 2 (two) times a week. Every Wed and Sat    [provider]  clonazePAM (KLONOPIN) 0.5 MG tablet Take 0.5 mg by mouth 2 (two) times daily as needed for anxiety.    [provider]  divalproex (DEPAKOTE SPRINKLE) 125 MG capsule Take 250-375 mg by mouth See admin instructions. 2-3 caps up to twice daily 02/01/22   [provider]  Dulaglutide (TRULICITY) 0.75 MG/0.5ML SOPN Inject 0.5 mg into the skin once a week. Wednesday    [provider]  DULoxetine (CYMBALTA) 60 MG capsule Take 1 capsule by mouth daily. 04/01/21   [provider]  ELIQUIS 5 MG TABS tablet Take 5 mg by mouth 2 (two) times daily. 07/20/21   [provider]  fluticasone (FLONASE) 50 MCG/ACT nasal spray Place 1 spray into both nostrils daily. 10/30/19   Abigail Miyamoto, MD  fluticasone-salmeterol (ADVAIR) 250-50 MCG/ACT AEPB Inhale 1 puff into the lungs every 12 (twelve) hours.    [provider]  gabapentin (NEURONTIN) 300 MG capsule Take 1 capsule (300 mg total) by mouth at bedtime. Patient taking differently: Take 300-400 mg by mouth See admin instructions. Up to 3 times daily 11/18/19   Abigail Miyamoto, MD  haloperidol (HALDOL) 5 MG tablet Take 5 mg by mouth every 8 (eight) hours as needed for agitation.    [provider]  ketoconazole (NIZORAL) 2 % shampoo Apply 1 Application topically 2 (two) times a week. Mondays and Thursdays     [provider]  loperamide (IMODIUM) 2 MG capsule Take 2 mg by mouth every 4 (four) hours as needed for diarrhea or loose stools.    [provider]  LORazepam (ATIVAN) 0.5 MG tablet Take 0.5 mg by mouth every 12 (twelve) hours as needed for anxiety.    [provider]  oxyCODONE (OXY IR/ROXICODONE) 5 MG immediate release tablet Take 5 mg by mouth every 8 (eight) hours as needed for pain. 06/10/22   [provider]  polyethylene glycol powder (GLYCOLAX/MIRALAX) 17 GM/SCOOP powder Take 1 Container by mouth every morning.    [provider]  saccharomyces boulardii (FLORASTOR) 250 MG capsule Take 250 mg by mouth 2 (two) times daily.    [provider]  senna-docusate (SENOKOT-S) 8.6-50 MG tablet Take 2 tablets by mouth 2 (two) times daily.    [provider]  torsemide (DEMADEX) 5 MG tablet Take 5 mg by mouth daily. Edema    [provider]  traZODone (DESYREL) 50 MG tablet Take 50 mg by mouth  at bedtime.    [provider]    Physical Exam: BP (!) 162/117   Pulse 96   Temp 97.7 F (36.5 C) (Oral)   Resp (!) 22   Wt 93.6 kg   SpO2 97%   BMI 78.46 kg/m   General: 67 y.o. year-old male well developed well nourished in no acute distress.  Alert and oriented x3. Cardiovascular: Regular rate and rhythm with no rubs or gallops.  No thyromegaly or JVD noted.  No lower extremity edema. 2/4 pulses in all 4 extremities. Respiratory: Clear to auscultation with no wheezes or rales. Good inspiratory effort. Abdomen: Soft nontender nondistended with normal bowel sounds x4 quadrants. Muskuloskeletal: No cyanosis, clubbing or edema noted bilaterally Neuro: CN II-XII intact, strength, sensation, reflexes Skin: No ulcerative lesions noted or rashes Psychiatry: Judgement and insight appear normal. Mood is appropriate for condition and setting          Labs on Admission:  Basic Metabolic Panel: Recent Labs  Lab  2023-03-17 2212 17-Mar-2023 2213  NA 141 143  K 3.8 3.9  CL 101 103  CO2 26  --   GLUCOSE 111* 108*  BUN 22 27*  CREATININE 1.35* 1.20  CALCIUM 9.4  --    Liver Function Tests: Recent Labs  Lab Mar 17, 2023 2212  AST 26  ALT 17  ALKPHOS 40  BILITOT 1.3*  PROT 7.8  ALBUMIN 3.7   No results for input(s): "LIPASE", "AMYLASE" in the last 168 hours. No results for input(s): "AMMONIA" in the last 168 hours. CBC: Recent Labs  Lab 17-Mar-2023 2212 2023-03-17 2213  WBC 11.7*  --   NEUTROABS 8.7*  --   HGB 16.2 17.7*  HCT 51.7 52.0  MCV 96.8  --   PLT 159  --    Cardiac Enzymes: No results for input(s): "CKTOTAL", "CKMB", "CKMBINDEX", "TROPONINI" in the last 168 hours.  BNP (last 3 results) No results for input(s): "BNP" in the last 8760 hours.  ProBNP (last 3 results) No results for input(s): "PROBNP" in the last 8760 hours.  CBG: No results for input(s): "GLUCAP" in the last 168 hours.  Radiological Exams on Admission: DG Chest Port 1 View  Result Date: March 17, 2023 CLINICAL DATA:  Altered mental status EXAM: PORTABLE CHEST 1 VIEW COMPARISON:  01/03/2021 FINDINGS: Low lung volumes. Subsegmental atelectasis at the bases. Enlarged cardiomediastinal silhouette. No pneumothorax. IMPRESSION: Low lung volumes with subsegmental atelectasis at the bases. Electronically Signed   By: Jasmine Pang M.D.   On: 03/17/23 23:39   CT HEAD CODE STROKE WO CONTRAST  Result Date: 03-17-2023 CLINICAL DATA:  Code stroke. Initial evaluation for neuro deficit, stroke suspected. EXAM: CT HEAD WITHOUT CONTRAST TECHNIQUE: Contiguous axial images were obtained from the base of the skull through the vertex without intravenous contrast. RADIATION DOSE REDUCTION: This exam was performed according to the departmental dose-optimization program which includes automated exposure control, adjustment of the mA and/or kV according to patient size and/or use of iterative reconstruction technique. COMPARISON:  Prior study  from 10/21/2021 FINDINGS: Brain: Examination degraded by motion and streak artifact. Age-related cerebral atrophy with mild chronic microvascular ischemic disease. No acute intracranial hemorrhage. No acute large vessel territory infarct. No mass lesion or midline shift. No hydrocephalus or extra-axial fluid collection. Vascular: No abnormal hyperdense vessel. Calcified atherosclerosis present at the skull base. Skull: Scalp soft tissues within normal limits.  Calvarium intact. Sinuses/Orbits: Globes and orbital soft tissues grossly within normal limits. Visualized paranasal sinuses are clear. Small chronic appearing right mastoid  effusion noted, of doubtful significance. Other: None. ASPECTS [facility] Stroke Program Early CT Score) - Ganglionic level infarction (caudate, lentiform nuclei, internal capsule, insula, M1-M3 cortex): 7 - Supraganglionic infarction (M4-M6 cortex): 3 Total score (0-10 with 10 being normal): 10 IMPRESSION: 1. No acute intracranial abnormality. 2. ASPECTS is 10. 3. Age-related cerebral atrophy with mild chronic small vessel ischemic disease. These results were communicated to Dr. Iver Nestle at 10:23 pm on 04/02/2023 by text page via the [facility] messaging system. Electronically Signed   By: Rise Mu M.D.   On: 04-02-23 22:24    EKG: I independently viewed the EKG done and my findings are as followed: ***   Assessment/Plan Present on Admission:  AMS (altered mental status)  Principal Problem:   AMS (altered mental status)   DVT prophylaxis: ***   Code Status: ***   Family Communication: ***   Disposition Plan: ***   Consults called: ***   Admission status: ***    Status is: Observation {Observation:23811}   Darlin Drop MD [facility] Pager [phone]  If 7PM-7AM, please contact night-coverage [email or web address] Password [facility]  2023-04-02, 11:49 PM

## 2023-03-07 ENCOUNTER — Observation Stay (HOSPITAL_COMMUNITY): Payer: Medicare Other

## 2023-03-07 ENCOUNTER — Other Ambulatory Visit: Payer: Self-pay

## 2023-03-07 DIAGNOSIS — G9341 Metabolic encephalopathy: Secondary | ICD-10-CM

## 2023-03-07 DIAGNOSIS — R569 Unspecified convulsions: Secondary | ICD-10-CM

## 2023-03-07 DIAGNOSIS — R4182 Altered mental status, unspecified: Secondary | ICD-10-CM | POA: Diagnosis not present

## 2023-03-07 LAB — COMPREHENSIVE METABOLIC PANEL
ALT: 18 U/L (ref 0–44)
AST: 27 U/L (ref 15–41)
Albumin: 3.4 g/dL — ABNORMAL LOW (ref 3.5–5.0)
Alkaline Phosphatase: 39 U/L (ref 38–126)
Anion gap: 16 — ABNORMAL HIGH (ref 5–15)
BUN: 23 mg/dL (ref 8–23)
CO2: 25 mmol/L (ref 22–32)
Calcium: 9.1 mg/dL (ref 8.9–10.3)
Chloride: 100 mmol/L (ref 98–111)
Creatinine, Ser: 1.39 mg/dL — ABNORMAL HIGH (ref 0.61–1.24)
GFR, Estimated: 55 mL/min — ABNORMAL LOW (ref >60)
Glucose, Bld: 108 mg/dL — ABNORMAL HIGH (ref 70–99)
Potassium: 3.6 mmol/L (ref 3.5–5.1)
Sodium: 141 mmol/L (ref 135–145)
Total Bilirubin: 1.1 mg/dL (ref 0.3–1.2)
Total Protein: 7.5 g/dL (ref 6.5–8.1)

## 2023-03-07 LAB — URINALYSIS, ROUTINE W REFLEX MICROSCOPIC
Bacteria, UA: NONE SEEN
Bilirubin Urine: NEGATIVE
Glucose, UA: NEGATIVE mg/dL
Ketones, ur: 20 mg/dL — AB
Leukocytes,Ua: NEGATIVE
Nitrite: NEGATIVE
Protein, ur: >=300 mg/dL — AB
Specific Gravity, Urine: 1.02 (ref 1.005–1.030)
pH: 5 (ref 5.0–8.0)

## 2023-03-07 LAB — RAPID URINE DRUG SCREEN, HOSP PERFORMED
Amphetamines: NOT DETECTED
Barbiturates: NOT DETECTED
Benzodiazepines: POSITIVE — AB
Cocaine: NOT DETECTED
Opiates: NOT DETECTED
Tetrahydrocannabinol: NOT DETECTED

## 2023-03-07 LAB — CBC
HCT: 47.4 % (ref 39.0–52.0)
Hemoglobin: 14.9 g/dL (ref 13.0–17.0)
MCH: 29.8 pg (ref 26.0–34.0)
MCHC: 31.4 g/dL (ref 30.0–36.0)
MCV: 94.8 fL (ref 80.0–100.0)
Platelets: 158 10*3/uL (ref 150–400)
RBC: 5 MIL/uL (ref 4.22–5.81)
RDW: 13.6 % (ref 11.5–15.5)
WBC: 11.8 10*3/uL — ABNORMAL HIGH (ref 4.0–10.5)
nRBC: 0 % (ref 0.0–0.2)

## 2023-03-07 LAB — I-STAT CG4 LACTIC ACID, ED: Lactic Acid, Venous: 1.5 mmol/L (ref 0.5–1.9)

## 2023-03-07 LAB — GLUCOSE, CAPILLARY: Glucose-Capillary: 109 mg/dL — ABNORMAL HIGH (ref 70–99)

## 2023-03-07 LAB — PHOSPHORUS: Phosphorus: 2.4 mg/dL — ABNORMAL LOW (ref 2.5–4.6)

## 2023-03-07 LAB — MAGNESIUM: Magnesium: 1.3 mg/dL — ABNORMAL LOW (ref 1.7–2.4)

## 2023-03-07 MED ORDER — NALOXONE HCL 0.4 MG/ML IJ SOLN
0.4000 mg | INTRAMUSCULAR | Status: DC | PRN
  Administered 2023-03-08: 0.4 mg via INTRAVENOUS
  Filled 2023-03-07: qty 1

## 2023-03-07 MED ORDER — VALPROATE SODIUM 100 MG/ML IV SOLN
375.0000 mg | Freq: Two times a day (BID) | INTRAVENOUS | Status: DC
  Administered 2023-03-07 – 2023-03-11 (×11): 375 mg via INTRAVENOUS
  Filled 2023-03-07 (×18): qty 3.75

## 2023-03-07 MED ORDER — PROCHLORPERAZINE EDISYLATE 10 MG/2ML IJ SOLN: 5.0000 mg | Freq: Four times a day (QID) | INTRAMUSCULAR | Status: DC | PRN

## 2023-03-07 MED ORDER — VALPROATE SODIUM 100 MG/ML IV SOLN
250.0000 mg | INTRAVENOUS | Status: DC
  Administered 2023-03-07 – 2023-03-11 (×5): 250 mg via INTRAVENOUS
  Filled 2023-03-07 (×3): qty 2.5
  Filled 2023-03-07: qty 250
  Filled 2023-03-07 (×5): qty 2.5

## 2023-03-07 MED ORDER — MELATONIN 5 MG PO TABS: 5.0000 mg | ORAL_TABLET | Freq: Every evening | ORAL | Status: DC | PRN

## 2023-03-07 MED ORDER — GABAPENTIN 100 MG PO CAPS: 100.0000 mg | ORAL_CAPSULE | Freq: Every day | ORAL | Status: DC

## 2023-03-07 MED ORDER — CALCITRIOL 0.5 MCG PO CAPS
0.5000 ug | ORAL_CAPSULE | Freq: Every day | ORAL | Status: DC
  Filled 2023-03-07: qty 1

## 2023-03-07 MED ORDER — LACTATED RINGERS IV SOLN: INTRAVENOUS | Status: AC

## 2023-03-07 MED ORDER — ASPIRIN 81 MG PO TBEC: 81.0000 mg | DELAYED_RELEASE_TABLET | Freq: Every day | ORAL | Status: DC

## 2023-03-07 MED ORDER — TRAZODONE HCL 50 MG PO TABS
50.0000 mg | ORAL_TABLET | Freq: Every day | ORAL | Status: DC
  Filled 2023-03-07: qty 1

## 2023-03-07 MED ORDER — POLYETHYLENE GLYCOL 3350 17 G PO PACK: 17.0000 g | PACK | Freq: Every day | ORAL | Status: DC | PRN

## 2023-03-07 MED ORDER — ARIPIPRAZOLE 5 MG PO TABS: 10.0000 mg | ORAL_TABLET | Freq: Every day | ORAL | Status: DC

## 2023-03-07 MED ORDER — VALPROATE SODIUM 100 MG/ML IV SOLN: 250.0000 mg | Freq: Two times a day (BID) | INTRAVENOUS | Status: DC

## 2023-03-07 MED ORDER — MAGNESIUM SULFATE 2 GM/50ML IV SOLN
2.0000 g | Freq: Once | INTRAVENOUS | Status: AC
  Administered 2023-03-07: 2 g via INTRAVENOUS
  Filled 2023-03-07: qty 50

## 2023-03-07 MED ORDER — APIXABAN 5 MG PO TABS
5.0000 mg | ORAL_TABLET | Freq: Two times a day (BID) | ORAL | Status: DC
  Administered 2023-03-07: 5 mg via ORAL
  Filled 2023-03-07 (×2): qty 1

## 2023-03-07 MED ORDER — HYDROMORPHONE HCL 1 MG/ML IJ SOLN
0.5000 mg | INTRAMUSCULAR | Status: DC | PRN
  Administered 2023-03-07 – 2023-03-08 (×2): 0.5 mg via INTRAVENOUS
  Filled 2023-03-07 (×2): qty 0.5

## 2023-03-07 MED ORDER — LABETALOL HCL 5 MG/ML IV SOLN: 5.0000 mg | INTRAVENOUS | Status: DC | PRN

## 2023-03-07 MED ORDER — HALOPERIDOL 1 MG PO TABS: 5.0000 mg | ORAL_TABLET | Freq: Three times a day (TID) | ORAL | Status: DC | PRN

## 2023-03-07 MED ORDER — SODIUM CHLORIDE 0.9% FLUSH
10.0000 mL | Freq: Two times a day (BID) | INTRAVENOUS | Status: DC
  Administered 2023-03-07 – 2023-03-11 (×9): 10 mL via INTRAVENOUS

## 2023-03-07 MED ORDER — BENZTROPINE MESYLATE 0.5 MG PO TABS
0.5000 mg | ORAL_TABLET | Freq: Two times a day (BID) | ORAL | Status: DC
  Filled 2023-03-07 (×8): qty 1

## 2023-03-07 MED ORDER — DULOXETINE HCL 60 MG PO CPEP
60.0000 mg | ORAL_CAPSULE | Freq: Every day | ORAL | Status: DC
  Filled 2023-03-07: qty 1

## 2023-03-07 NOTE — Procedures (Signed)
Patient Name: Matthew Gutierrez  MRN: [id number]  Epilepsy Attending: Charlsie Quest  Referring Physician/Provider: Gordy Councilman, MD  Date: 03/07/2023  Duration: 25.33 mins  Patient history: 67yo M with ams getting eeg to evaluate for seizure  Level of alertness: Awake, drowsy  AEDs during EEG study: VPA, GBP  Technical aspects: This EEG study was done with scalp electrodes positioned according to the 10-20 International system of electrode placement. Electrical activity was reviewed with band pass filter of 1-70Hz , sensitivity of 7 uV/mm, display speed of 104mm/sec with a 60Hz  notched filter applied as appropriate. EEG data were recorded continuously and digitally stored.  Video monitoring was available and reviewed as appropriate.  Description: The posterior dominant rhythm consists of 7 Hz activity of moderate voltage (25-35 uV) seen predominantly in posterior head regions, symmetric and reactive to eye opening and eye closing. Drowsiness was characterized by attenuation of the posterior background rhythm. EEG showed continuous generalized 3 to 6 Hz theta-delta slowing. Hyperventilation and photic stimulation were not performed.     ABNORMALITY - Continuous slow, generalized  IMPRESSION: This study is suggestive of moderate diffuse encephalopathy. No seizures or epileptiform discharges were seen throughout the recording.  Matthew Gutierrez

## 2023-03-07 NOTE — Plan of Care (Incomplete)
EEG with generalized slowing MRI brain personally reviewed, agree with radiology no abnormality with the caveat of significant motion limitation  Per notes Son Matthew Gutierrez was called and updated, he has been more slower and less interactive for last 2 days. At base line he is mostly bed bound but interactive and feeds himself with some support.   ***

## 2023-03-07 NOTE — Progress Notes (Signed)
[facility] night cross cover note:   I was notified by RN that this patient appears to be in pain.  I subsequently placed order for prn IV Dilaudid to address this.    Newton Pigg, DO Hospitalist

## 2023-03-07 NOTE — ED Notes (Signed)
Sisters at bedside 

## 2023-03-07 NOTE — ED Notes (Signed)
Pt transported to MRI 

## 2023-03-07 NOTE — ED Notes (Signed)
Pt calmer, resting with stretcher locked in lowest position with call bell in reach directly across from nurse's station.

## 2023-03-07 NOTE — Progress Notes (Signed)
EEG complete - results pending 

## 2023-03-07 NOTE — Progress Notes (Signed)
PROGRESS NOTE    Matthew Gutierrez  YQM:[id number] DOB: 1956/05/09 DOA: 03/20/2023 PCP: Mikki Harbor, MD    Brief Narrative:  Patient is 67 year old gentleman with history of paroxysmal A-fib on Eliquis, peripheral arterial disease status post bilateral above-the-knee amputation, hyperlipidemia, seizure disorder and long-term nursing home resident brought from nursing home with about 2 days of difficulty speaking and less responsiveness.  In the emergency room code stroke was called.  Initial noncontrast CT head was nonacute.  Seen by neurology and recommended admission for altered mentation.  Last known well was 2 days ago.  In the emergency room, hemodynamically stable.  Mostly on 2 L of oxygen.  Son Matthew Gutierrez was called and updated, he has been more slower and less interactive for last 2 days. At base line he is mostly bed bound but interactive and feeds himself with some support.   Assessment & Plan:   Altered mental status, acute metabolic encephalopathy likely multifactorial.  Suspected seizure and postictal.  Suspected stroke however his scans are negative.  Suspect TIA or seizure.  Patient is somehow responding, still significantly slow to respond.  Currently hemodynamically stable.  No focal neurological deficits. Initial CT scan and subsequent MRI with poor quality but no obvious intracranial abnormalities or evidence of acute stroke. Depakote levels and phenytoin levels were subnormal, reportedly only taking Depakote at nursing home. EEG, neurology following MRI, poor quality but no significant abnormalities Continue conservative management.  NPO.  IV fluid support.  Seizure precautions.  Fall precautions.  Avoid all sedative medications. Further recommendations as per neurology. Speech, PT OT.  Currently remains n.p.o.  Except meds.  Seizure disorder, loaded with Depakote and now on IV Depakote with valproate.  Levels were subtherapeutic.  EEG is without active  seizure.  Hypomagnesemia: Replaced aggressively.  Will recheck levels.  Chronic medical issues Paroxysmal A-fib on Eliquis, currently sinus rhythm.  Eliquis continued. Hypertension, blood pressures fairly elevated.  Resume home medications.  Currently no indication for permissive hypertension. Chronic anxiety and depression, on multiple benzodiazepines.  Will start on low-dose of Ativan. Type 2 diabetes with hyperglycemia, holding oral hypoglycemics.  Will keep on sliding scale insulin. CKD stage IIIa, monitor on treatment.  Maintenance IV fluids.   DVT prophylaxis:  apixaban (ELIQUIS) tablet 5 mg   Code Status: DNR with limited intervention Family Communication: Son Matthew Gutierrez and Matthew Gutierrez called and updated.  Matthew Gutierrez is primary contact for the patient. Disposition Plan: Status is: Observation The patient will require care spanning > 2 midnights and should be moved to inpatient because: Altered mental status, n.p.o.     Consultants:  Neurology  Procedures:  Routine EEG  Antimicrobials:  None   Subjective: Patient seen and examined.  He was mostly sleepy but wakes up and responds appropriately to basic questions and follows commands.  His voice is difficult to understand.  He has some droop on the right corner of the mouth.  Patient tells me "I am fine, I am fine".  He failed swallow evaluation.  Nurses were however able to give him his medications including Eliquis by mouth.  Objective: Vitals:   03/07/23 0623 03/07/23 0841 03/07/23 1035 03/07/23 1039  BP: (!) 169/121 (!) 140/84 (!) 176/88   Pulse: 84 83 82   Resp: (!) 22 20 19    Temp: 98 F (36.7 C)   98 F (36.7 C)  TempSrc: Oral   Oral  SpO2: 96% 96% 95%   Weight:        Intake/Output Summary (Last 24  hours) at 03/07/2023 1126 Last data filed at 03/07/2023 1057 Gross per 24 hour  Intake 110 ml  Output 100 ml  Net 10 ml   Filed Weights   03/15/2023 2200  Weight: 93.6 kg    Examination:  General: Chronic sick  gentleman.  Not in any distress. Patient is alert on stimulation.  He follows simple commands.  Difficult to stay awake and exam his motor stand on both sides. Blinks to threat bilaterally. Right facial droop. Left upper extremity weaker than right. Cardiovascular: S1-S2 normal.  Regular rate rhythm. Respiratory: Bilateral clear.  Occasional conducted upper airway sounds. Gastrointestinal: Soft.  Nontender.  Obese and pendulous. Ext: No edema.  Both leg with above-knee amputation stump clean.      Data Reviewed: I have personally reviewed following labs and imaging studies  CBC: Recent Labs  Lab 03/23/2023 2212 03/23/2023 2213 03/07/23 0342  WBC 11.7*  --  11.8*  NEUTROABS 8.7*  --   --   HGB 16.2 17.7* 14.9  HCT 51.7 52.0 47.4  MCV 96.8  --  94.8  PLT 159  --  158   Basic Metabolic Panel: Recent Labs  Lab 03/17/2023 2212 03/30/2023 2213 03/07/23 0342  NA 141 143 141  K 3.8 3.9 3.6  CL 101 103 100  CO2 26  --  25  GLUCOSE 111* 108* 108*  BUN 22 27* 23  CREATININE 1.35* 1.20 1.39*  CALCIUM 9.4  --  9.1  MG  --   --  1.3*  PHOS  --   --  2.4*   GFR: Estimated Creatinine Clearance: 31.7 mL/min (A) (by C-G formula based on SCr of 1.39 mg/dL (H)). Liver Function Tests: Recent Labs  Lab 03/20/2023 2212 03/07/23 0342  AST 26 27  ALT 17 18  ALKPHOS 40 39  BILITOT 1.3* 1.1  PROT 7.8 7.5  ALBUMIN 3.7 3.4*   No results for input(s): "LIPASE", "AMYLASE" in the last 168 hours. No results for input(s): "AMMONIA" in the last 168 hours. Coagulation Profile: Recent Labs  Lab 02/28/2023 2212  INR 1.3*   Cardiac Enzymes: No results for input(s): "CKTOTAL", "CKMB", "CKMBINDEX", "TROPONINI" in the last 168 hours. BNP (last 3 results) No results for input(s): "PROBNP" in the last 8760 hours. HbA1C: No results for input(s): "HGBA1C" in the last 72 hours. CBG: Recent Labs  Lab 03/16/2023 2208  GLUCAP 109*   Lipid Profile: No results for input(s): "CHOL", "HDL", "LDLCALC",  "TRIG", "CHOLHDL", "LDLDIRECT" in the last 72 hours. Thyroid Function Tests: No results for input(s): "TSH", "T4TOTAL", "FREET4", "T3FREE", "THYROIDAB" in the last 72 hours. Anemia Panel: No results for input(s): "VITAMINB12", "FOLATE", "FERRITIN", "TIBC", "IRON", "RETICCTPCT" in the last 72 hours. Sepsis Labs: Recent Labs  Lab 03/07/23 0423  LATICACIDVEN 1.5    No results found for this or any previous visit (from the past 240 hour(s)).       Radiology Studies: MR BRAIN WO CONTRAST  Result Date: 03/07/2023 CLINICAL DATA:  67 year old male code stroke presentation yesterday. Delirium. EXAM: MRI HEAD WITHOUT CONTRAST TECHNIQUE: Multiplanar, multiecho pulse sequences of the brain and surrounding structures were obtained without intravenous contrast. COMPARISON:  Head CT yesterday.  Previous brain MRI 10/21/2021. FINDINGS: The examination had to be discontinued prior to completion due to altered mental status, inability to tolerate despite premedication. Motion degraded DWI, axial T2, FLAIR, T1 imaging, sagittal T1 and coronal T2 imaging provided. ICA siphon flow voids appear preserved. Cerebral volume and morphology appears not significantly changed from last year  on axial T2 imaging. Chronic lacunar infarcts in the left basal ganglia, right thalamus. No midline shift, mass effect, or evidence of intracranial mass lesion. No ventriculomegaly. No restricted diffusion is evident. Axial diffusion is especially degraded. Coronal diffusion is of somewhat better quality. Negative visible orbits, paranasal sinuses. IMPRESSION: 1. Limited exam due to significant motion degradation, truncation. 2. No acute intracranial abnormality identified. Chronic small vessel disease in the left basal ganglia and right thalamus. Electronically Signed   By: Odessa Fleming M.D.   On: 03/07/2023 10:08   EEG adult  Result Date: 03/07/2023 Charlsie Quest, MD     03/07/2023  3:47 AM Patient Name: Matthew Gutierrez MRN: [id number]  Epilepsy Attending: Charlsie Quest Referring Physician/Provider: Gordy Councilman, MD Date: 03/07/2023 Duration: 25.33 mins Patient history: 67yo M with ams getting eeg to evaluate for seizure Level of alertness: Awake, drowsy AEDs during EEG study: VPA, GBP Technical aspects: This EEG study was done with scalp electrodes positioned according to the 10-20 International system of electrode placement. Electrical activity was reviewed with band pass filter of 1-70Hz , sensitivity of 7 uV/mm, display speed of 96mm/sec with a 60Hz  notched filter applied as appropriate. EEG data were recorded continuously and digitally stored.  Video monitoring was available and reviewed as appropriate. Description: The posterior dominant rhythm consists of 7 Hz activity of moderate voltage (25-35 uV) seen predominantly in posterior head regions, symmetric and reactive to eye opening and eye closing. Drowsiness was characterized by attenuation of the posterior background rhythm. EEG showed continuous generalized 3 to 6 Hz theta-delta slowing. Hyperventilation and photic stimulation were not performed.   ABNORMALITY - Continuous slow, generalized IMPRESSION: This study is suggestive of moderate diffuse encephalopathy. No seizures or epileptiform discharges were seen throughout the recording. Charlsie Quest   DG Abd 1 View  Result Date: 03/07/2023 CLINICAL DATA:  Metal screening prior to MRI EXAM: ABDOMEN - 1 VIEW COMPARISON:  None Available. FINDINGS: Telemetry leads overlie the abdomen. No radiopaque foreign body to prohibit MRI. Nonobstructive bowel-gas pattern. Vascular calcifications. Degenerative changes pubic symphysis, both hips, SI joints and lower lumbar spine. IMPRESSION: No radiopaque foreign body to prohibit MRI. Electronically Signed   By: Minerva Fester M.D.   On: 03/07/2023 01:34   DG Chest Port 1 View  Result Date: March 24, 2023 CLINICAL DATA:  Altered mental status EXAM: PORTABLE CHEST 1 VIEW COMPARISON:   01/03/2021 FINDINGS: Low lung volumes. Subsegmental atelectasis at the bases. Enlarged cardiomediastinal silhouette. No pneumothorax. IMPRESSION: Low lung volumes with subsegmental atelectasis at the bases. Electronically Signed   By: Jasmine Pang M.D.   On: 03/24/2023 23:39   CT HEAD CODE STROKE WO CONTRAST  Result Date: March 24, 2023 CLINICAL DATA:  Code stroke. Initial evaluation for neuro deficit, stroke suspected. EXAM: CT HEAD WITHOUT CONTRAST TECHNIQUE: Contiguous axial images were obtained from the base of the skull through the vertex without intravenous contrast. RADIATION DOSE REDUCTION: This exam was performed according to the departmental dose-optimization program which includes automated exposure control, adjustment of the mA and/or kV according to patient size and/or use of iterative reconstruction technique. COMPARISON:  Prior study from 10/21/2021 FINDINGS: Brain: Examination degraded by motion and streak artifact. Age-related cerebral atrophy with mild chronic microvascular ischemic disease. No acute intracranial hemorrhage. No acute large vessel territory infarct. No mass lesion or midline shift. No hydrocephalus or extra-axial fluid collection. Vascular: No abnormal hyperdense vessel. Calcified atherosclerosis present at the skull base. Skull: Scalp soft tissues within normal limits.  Calvarium intact. Sinuses/Orbits: Globes  and orbital soft tissues grossly within normal limits. Visualized paranasal sinuses are clear. Small chronic appearing right mastoid effusion noted, of doubtful significance. Other: None. ASPECTS [facility] Stroke Program Early CT Score) - Ganglionic level infarction (caudate, lentiform nuclei, internal capsule, insula, M1-M3 cortex): 7 - Supraganglionic infarction (M4-M6 cortex): 3 Total score (0-10 with 10 being normal): 10 IMPRESSION: 1. No acute intracranial abnormality. 2. ASPECTS is 10. 3. Age-related cerebral atrophy with mild chronic small vessel ischemic disease.  These results were communicated to Dr. Iver Nestle at 10:23 pm on March 09, 2023 by text page via the [facility] messaging system. Electronically Signed   By: Rise Mu M.D.   On: 03-09-23 22:24        Scheduled Meds:  apixaban  5 mg Oral BID   atorvastatin  20 mg Oral Daily   gabapentin  100 mg Oral QHS   sodium chloride flush  10 mL Intravenous Q12H   Continuous Infusions:  lactated ringers 50 mL/hr at 03/07/23 0354   magnesium sulfate bolus IVPB     valproate sodium     valproate sodium Stopped (03/07/23 0411)     LOS: 0 days    Time spent: 50 minutes.    Dorcas Carrow, MD [facility]

## 2023-03-07 NOTE — ED Notes (Signed)
Patient is back from MRI, unable to complete due to being altered and not cooperative. Will try again later. PRN Ativan is ordered.

## 2023-03-07 NOTE — ED Notes (Signed)
ED TO INPATIENT HANDOFF REPORT  ED Nurse Name and Phone #: Morrie Sheldon [phone]  S Name/Age/Gender Matthew Gutierrez 67 y.o. male Room/Bed: 003C/003C  Code Status   Code Status: Limited: Do not attempt resuscitation (DNR) -DNR-LIMITED -Do Not Intubate/DNI   Home/SNF/Other Home Patient oriented to: self Is this baseline? No   Triage Complete: Triage complete  Chief Complaint AMS (altered mental status) [R41.82]  Triage Note Patient BIB EMS as CODE Stroke.  LKW was Friday at 1500 per CNA at facility.  RN recalls that symptoms started around 1500 today.  At baseline patient is normally alert, oriented x 4, able to communicate, and able to feed himself.  Patient is only responding to pain at this time.  Hx of CVA, DM2.   Allergies Allergies  Allergen Reactions   Codeine Other (See Comments)    Unknown Reaction    Level of Care/Admitting Diagnosis ED Disposition     ED Disposition  Admit   Condition  --   Comment  Hospital Area: [facility] [[id number]]  Level of Care: Telemetry Medical [[id number]]  May place patient in observation at [facility] or [facility] if equivalent level of care is available:: Yes  Covid Evaluation: Asymptomatic - no recent exposure (last 10 days) testing not required  Diagnosis: AMS (altered mental status) [[id number]]  Admitting Physician: Darlin Drop [[id number]]  Attending Physician: Darlin Drop [[id number]]          B Medical/Surgery History Past Medical History:  Diagnosis Date   Absence epileptic syndrome, not intractable, without status epilepticus (HCC) 07/23/2019   Acute renal failure superimposed on stage 3 chronic kidney disease (HCC) 07/23/2019   Anemia, unspecified    Atherosclerotic heart disease 07/23/2019   Atrial fibrillation with RVR (HCC) 12/14/2019   Benign enlargement of prostate 07/23/2019   Bipolar 1 disorder (HCC)    Body mass index 50.0-59.9, adult (HCC) 07/23/2019   Cellulitis of leg 12/14/2019   Chronic  respiratory failure with hypoxia (HCC) 07/23/2019   Chronic venous hypertension (idiopathic) with ulcer of left lower extremity (HCC) 07/23/2019   COPD (chronic obstructive pulmonary disease) (HCC) 07/23/2019   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   Diabetic neuropathy (HCC) 07/23/2019   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HCC) 10/15/2019   DVT (deep venous thrombosis) (HCC)    Epilepsy, grand mal (HCC)    Essential hypertension 01/22/2018   Gout attack 10/06/2019   H/O: stroke 01/22/2018   History of DVT (deep vein thrombosis) 01/22/2018   Long term (current) use of anticoagulants 07/23/2019   Lumbago    Lymphedema of both lower extremities 01/22/2018   Mixed hyperlipidemia 07/23/2019   Morbid obesity (HCC) 07/23/2019   Obstructive sleep apnea 07/23/2019   Paroxysmal atrial fibrillation (HCC) 01/07/2020   Peripheral vascular disease, unspecified (HCC) 07/12/2011   Schizophrenia (HCC)    Secondary hypercoagulable state (HCC) 01/07/2020   Shoulder bursitis 07/25/2019   Stroke (HCC)    Tremor    Type 2 diabetes mellitus (HCC)    Past Surgical History:  Procedure Laterality Date   ABOVE KNEE LEG AMPUTATION Right    CYSTOSTOMY  01/07/2021   ERCP N/A 12/30/2020   Procedure: ENDOSCOPIC RETROGRADE CHOLANGIOPANCREATOGRAPHY (ERCP);  Surgeon: Meryl Dare, MD;  Location: [facility] ENDOSCOPY;  Service: Endoscopy;  Laterality: N/A;   IR PERC CHOLECYSTOSTOMY  12/29/2020   REMOVAL OF STONES  12/30/2020   Procedure: REMOVAL OF STONES;  Surgeon: Meryl Dare, MD;  Location: [facility] ENDOSCOPY;  Service: Endoscopy;;   SPHINCTEROTOMY  12/30/2020  Procedure: SPHINCTEROTOMY;  Surgeon: Meryl Dare, MD;  Location: [facility] ENDOSCOPY;  Service: Endoscopy;;     A IV Location/Drains/Wounds Patient Lines/Drains/Airways Status     Active Line/Drains/Airways     Name Placement date Placement time Site Days   Peripheral IV 03/19/2023 18 G Right Wrist 03/19/2023  2208  Wrist   1   Peripheral IV 02/28/2023 18 G Left  Wrist 03/21/2023  2208  Wrist   1   Biliary Tube 10 Fr. RLQ 12/29/20  1156  RLQ  798   External Urinary Catheter 12/29/20  1820  --  798   Pressure Injury 12/29/20 Heel Left;Lateral Unstageable - Full thickness tissue loss in which the base of the injury is covered by slough (yellow, tan, gray, green or brown) and/or eschar (tan, brown or black) in the wound bed. 12/29/20  2300  -- 798   Pressure Injury 12/29/20 Ankle Left;Lateral Unstageable - Full thickness tissue loss in which the base of the injury is covered by slough (yellow, tan, gray, green or brown) and/or eschar (tan, brown or black) in the wound bed. 12/29/20  2300  -- 798   Pressure Injury 12/29/20 Ankle Left;Medial;Lateral Unstageable - Full thickness tissue loss in which the base of the injury is covered by slough (yellow, tan, gray, green or brown) and/or eschar (tan, brown or black) in the wound bed. 12/29/20  2300  -- 798   Pressure Injury 12/29/20 Ankle Right;Lateral Unstageable - Full thickness tissue loss in which the base of the injury is covered by slough (yellow, tan, gray, green or brown) and/or eschar (tan, brown or black) in the wound bed. 12/29/20  2300  -- 798   Pressure Injury 12/29/20 Heel Right;Lateral Unstageable - Full thickness tissue loss in which the base of the injury is covered by slough (yellow, tan, gray, green or brown) and/or eschar (tan, brown or black) in the wound bed. 12/29/20  2300  -- 798   Pressure Injury Foot Right;Lateral Unstageable - Full thickness tissue loss in which the base of the injury is covered by slough (yellow, tan, gray, green or brown) and/or eschar (tan, brown or black) in the wound bed. --  --  -- --            Intake/Output Last 24 hours  Intake/Output Summary (Last 24 hours) at 03/07/2023 1549 Last data filed at 03/07/2023 1523 Gross per 24 hour  Intake 258.37 ml  Output 100 ml  Net 158.37 ml    Labs/Imaging Results for orders placed or performed during the hospital encounter  of 03/19/2023 (from the past 48 hour(s))  Glucose, capillary     Status: Abnormal   Collection Time: 03/04/2023 10:08 PM  Result Value Ref Range   Glucose-Capillary 109 (H) 70 - 99 mg/dL    Comment: Glucose reference range applies only to samples taken after fasting for at least 8 hours.  Valproic acid level     Status: Abnormal   Collection Time: 03/08/2023 10:12 PM  Result Value Ref Range   Valproic Acid Lvl 39 (L) 50.0 - 100.0 ug/mL    Comment: Performed at [facility], [address], [place], [place] [address]  Phenytoin level, total     Status: Abnormal   Collection Time: 03/02/2023 10:12 PM  Result Value Ref Range   Phenytoin Lvl <2.5 (L) 10.0 - 20.0 ug/mL    Comment: REPEATED TO VERIFY Performed at [facility], [address], [place], [place] [address]   Comprehensive  metabolic panel     Status: Abnormal   Collection Time: 2023/03/11 10:12 PM  Result Value Ref Range   Sodium 141 135 - 145 mmol/L   Potassium 3.8 3.5 - 5.1 mmol/L   Chloride 101 98 - 111 mmol/L   CO2 26 22 - 32 mmol/L   Glucose, Bld 111 (H) 70 - 99 mg/dL    Comment: Glucose reference range applies only to samples taken after fasting for at least 8 hours.   BUN 22 8 - 23 mg/dL   Creatinine, Ser 6.96 (H) 0.61 - 1.24 mg/dL   Calcium 9.4 8.9 - 29.5 mg/dL   Total Protein 7.8 6.5 - 8.1 g/dL   Albumin 3.7 3.5 - 5.0 g/dL   AST 26 15 - 41 U/L   ALT 17 0 - 44 U/L   Alkaline Phosphatase 40 38 - 126 U/L   Total Bilirubin 1.3 (H) 0.3 - 1.2 mg/dL   GFR, Estimated 57 (L) >60 mL/min    Comment: (NOTE) Calculated using the CKD-EPI Creatinine Equation (2021)    Anion gap 14 5 - 15    Comment: Performed at [facility], [address] [place], [place] [address]  CBC with Differential/Platelet     Status: Abnormal   Collection Time: 11-Mar-2023 10:12 PM  Result Value Ref Range   WBC 11.7 (H) 4.0 - 10.5 K/uL   RBC 5.34 4.22 - 5.81 MIL/uL   Hemoglobin 16.2 13.0 - 17.0 g/dL   HCT 24.4 01.0 - 27.2 %   MCV 96.8  80.0 - 100.0 fL   MCH 30.3 26.0 - 34.0 pg   MCHC 31.3 30.0 - 36.0 g/dL   RDW 53.6 64.4 - 03.4 %   Platelets 159 150 - 400 K/uL   nRBC 0.0 0.0 - 0.2 %   Neutrophils Relative % 74 %   Neutro Abs 8.7 (H) 1.7 - 7.7 K/uL   Lymphocytes Relative 16 %   Lymphs Abs 1.9 0.7 - 4.0 K/uL   Monocytes Relative 8 %   Monocytes Absolute 0.9 0.1 - 1.0 K/uL   Eosinophils Relative 1 %   Eosinophils Absolute 0.2 0.0 - 0.5 K/uL   Basophils Relative 1 %   Basophils Absolute 0.1 0.0 - 0.1 K/uL   Immature Granulocytes 0 %   Abs Immature Granulocytes 0.05 0.00 - 0.07 K/uL    Comment: Performed at [facility], [address], [place], [place] [address]  APTT     Status: None   Collection Time: Mar 11, 2023 10:12 PM  Result Value Ref Range   aPTT 35 24 - 36 seconds    Comment: Performed at [facility], [address], [place], [place] [address]  Protime-INR     Status: Abnormal   Collection Time: 03-11-23 10:12 PM  Result Value Ref Range   Prothrombin Time 16.0 (H) 11.4 - 15.2 seconds   INR 1.3 (H) 0.8 - 1.2    Comment: (NOTE) INR goal varies based on device and disease states. Performed at [facility], [address], [place], [place] [address]   I-stat chem 8, ED     Status: Abnormal   Collection Time: 03/11/23 10:13 PM  Result Value Ref Range   Sodium 143 135 - 145 mmol/L   Potassium 3.9 3.5 - 5.1 mmol/L   Chloride 103 98 - 111 mmol/L   BUN 27 (H) 8 - 23 mg/dL   Creatinine, Ser 2.95 0.61 - 1.24 mg/dL   Glucose, Bld 188 (  H) 70 - 99 mg/dL    Comment: Glucose reference range applies only to samples taken after fasting for at least 8 hours.   Calcium, Ion 1.15 1.15 - 1.40 mmol/L   TCO2 29 22 - 32 mmol/L   Hemoglobin 17.7 (H) 13.0 - 17.0 g/dL   HCT 36.6 44.0 - 34.7 %  Urinalysis, Routine w reflex microscopic -Urine, Clean Catch     Status: Abnormal   Collection Time: March 22, 2023 11:33 PM  Result Value Ref Range   Color, Urine YELLOW YELLOW   APPearance CLEAR CLEAR   Specific  Gravity, Urine 1.020 1.005 - 1.030   pH 5.0 5.0 - 8.0   Glucose, UA NEGATIVE NEGATIVE mg/dL   Hgb urine dipstick MODERATE (A) NEGATIVE   Bilirubin Urine NEGATIVE NEGATIVE   Ketones, ur 20 (A) NEGATIVE mg/dL   Protein, ur >=425 (A) NEGATIVE mg/dL   Nitrite NEGATIVE NEGATIVE   Leukocytes,Ua NEGATIVE NEGATIVE   RBC / HPF 21-50 0 - 5 RBC/hpf   WBC, UA 0-5 0 - 5 WBC/hpf   Bacteria, UA NONE SEEN NONE SEEN   Squamous Epithelial / HPF 0-5 0 - 5 /HPF   Mucus PRESENT     Comment: Performed at [facility], [address] [place], [place] [address]  Rapid urine drug screen (hospital performed)     Status: Abnormal   Collection Time: 2023/03/22 11:33 PM  Result Value Ref Range   Opiates NONE DETECTED NONE DETECTED   Cocaine NONE DETECTED NONE DETECTED   Benzodiazepines POSITIVE (A) NONE DETECTED   Amphetamines NONE DETECTED NONE DETECTED   Tetrahydrocannabinol NONE DETECTED NONE DETECTED   Barbiturates NONE DETECTED NONE DETECTED    Comment: (NOTE) DRUG SCREEN FOR MEDICAL PURPOSES ONLY.  IF CONFIRMATION IS NEEDED FOR ANY PURPOSE, NOTIFY LAB WITHIN 5 DAYS.  LOWEST DETECTABLE LIMITS FOR URINE DRUG SCREEN Drug Class                     Cutoff (ng/mL) Amphetamine and metabolites    1000 Barbiturate and metabolites    200 Benzodiazepine                 200 Opiates and metabolites        300 Cocaine and metabolites        300 THC                            50 Performed at [facility], [address], [place], [place] [address]   CBC     Status: Abnormal   Collection Time: 03/07/23  3:42 AM  Result Value Ref Range   WBC 11.8 (H) 4.0 - 10.5 K/uL   RBC 5.00 4.22 - 5.81 MIL/uL   Hemoglobin 14.9 13.0 - 17.0 g/dL   HCT 32.9 51.8 - 84.1 %   MCV 94.8 80.0 - 100.0 fL   MCH 29.8 26.0 - 34.0 pg   MCHC 31.4 30.0 - 36.0 g/dL   RDW 66.0 63.0 - 16.0 %   Platelets 158 150 - 400 K/uL   nRBC 0.0 0.0 - 0.2 %    Comment: Performed at [facility], [address], [place],  [place] [address]  Comprehensive metabolic panel     Status: Abnormal   Collection Time: 03/07/23  3:42 AM  Result Value Ref Range   Sodium 141 135 - 145 mmol/L   Potassium 3.6 3.5 - 5.1 mmol/L  Chloride 100 98 - 111 mmol/L   CO2 25 22 - 32 mmol/L   Glucose, Bld 108 (H) 70 - 99 mg/dL    Comment: Glucose reference range applies only to samples taken after fasting for at least 8 hours.   BUN 23 8 - 23 mg/dL   Creatinine, Ser 4.40 (H) 0.61 - 1.24 mg/dL   Calcium 9.1 8.9 - 10.2 mg/dL   Total Protein 7.5 6.5 - 8.1 g/dL   Albumin 3.4 (L) 3.5 - 5.0 g/dL   AST 27 15 - 41 U/L   ALT 18 0 - 44 U/L   Alkaline Phosphatase 39 38 - 126 U/L   Total Bilirubin 1.1 0.3 - 1.2 mg/dL   GFR, Estimated 55 (L) >60 mL/min    Comment: (NOTE) Calculated using the CKD-EPI Creatinine Equation (2021)    Anion gap 16 (H) 5 - 15    Comment: Performed at [facility], [address], [place], [place] [address]  Magnesium     Status: Abnormal   Collection Time: 03/07/23  3:42 AM  Result Value Ref Range   Magnesium 1.3 (L) 1.7 - 2.4 mg/dL    Comment: Performed at [facility], [address], [place], [place] [address]  Phosphorus     Status: Abnormal   Collection Time: 03/07/23  3:42 AM  Result Value Ref Range   Phosphorus 2.4 (L) 2.5 - 4.6 mg/dL    Comment: Performed at [facility], [address] [place], [place] [address]  I-Stat Lactic Acid     Status: None   Collection Time: 03/07/23  4:23 AM  Result Value Ref Range   Lactic Acid, Venous 1.5 0.5 - 1.9 mmol/L   MR BRAIN WO CONTRAST  Result Date: 03/07/2023 CLINICAL DATA:  67 year old male code stroke presentation yesterday. Delirium. EXAM: MRI HEAD WITHOUT CONTRAST TECHNIQUE: Multiplanar, multiecho pulse sequences of the brain and surrounding structures were obtained without intravenous contrast. COMPARISON:  Head CT yesterday.  Previous brain MRI 10/21/2021. FINDINGS: The examination had to be discontinued prior to completion due to altered  mental status, inability to tolerate despite premedication. Motion degraded DWI, axial T2, FLAIR, T1 imaging, sagittal T1 and coronal T2 imaging provided. ICA siphon flow voids appear preserved. Cerebral volume and morphology appears not significantly changed from last year on axial T2 imaging. Chronic lacunar infarcts in the left basal ganglia, right thalamus. No midline shift, mass effect, or evidence of intracranial mass lesion. No ventriculomegaly. No restricted diffusion is evident. Axial diffusion is especially degraded. Coronal diffusion is of somewhat better quality. Negative visible orbits, paranasal sinuses. IMPRESSION: 1. Limited exam due to significant motion degradation, truncation. 2. No acute intracranial abnormality identified. Chronic small vessel disease in the left basal ganglia and right thalamus. Electronically Signed   By: Odessa Fleming M.D.   On: 03/07/2023 10:08   EEG adult  Result Date: 03/07/2023 Charlsie Quest, MD     03/07/2023  3:47 AM Patient Name: Matthew Gutierrez MRN: [id number] Epilepsy Attending: Charlsie Quest Referring Physician/Provider: Gordy Councilman, MD Date: 03/07/2023 Duration: 25.33 mins Patient history: 67yo M with ams getting eeg to evaluate for seizure Level of alertness: Awake, drowsy AEDs during EEG study: VPA, GBP Technical aspects: This EEG study was done with scalp electrodes positioned according to the 10-20 International system of electrode placement. Electrical activity was reviewed with band pass filter of 1-70Hz , sensitivity of 7 uV/mm, display speed of 8mm/sec with a 60Hz  notched filter applied as  appropriate. EEG data were recorded continuously and digitally stored.  Video monitoring was available and reviewed as appropriate. Description: The posterior dominant rhythm consists of 7 Hz activity of moderate voltage (25-35 uV) seen predominantly in posterior head regions, symmetric and reactive to eye opening and eye closing. Drowsiness was characterized by  attenuation of the posterior background rhythm. EEG showed continuous generalized 3 to 6 Hz theta-delta slowing. Hyperventilation and photic stimulation were not performed.   ABNORMALITY - Continuous slow, generalized IMPRESSION: This study is suggestive of moderate diffuse encephalopathy. No seizures or epileptiform discharges were seen throughout the recording. Charlsie Quest   DG Abd 1 View  Result Date: 03/07/2023 CLINICAL DATA:  Metal screening prior to MRI EXAM: ABDOMEN - 1 VIEW COMPARISON:  None Available. FINDINGS: Telemetry leads overlie the abdomen. No radiopaque foreign body to prohibit MRI. Nonobstructive bowel-gas pattern. Vascular calcifications. Degenerative changes pubic symphysis, both hips, SI joints and lower lumbar spine. IMPRESSION: No radiopaque foreign body to prohibit MRI. Electronically Signed   By: Minerva Fester M.D.   On: 03/07/2023 01:34   DG Chest Port 1 View  Result Date: 03-24-23 CLINICAL DATA:  Altered mental status EXAM: PORTABLE CHEST 1 VIEW COMPARISON:  01/03/2021 FINDINGS: Low lung volumes. Subsegmental atelectasis at the bases. Enlarged cardiomediastinal silhouette. No pneumothorax. IMPRESSION: Low lung volumes with subsegmental atelectasis at the bases. Electronically Signed   By: Jasmine Pang M.D.   On: Mar 24, 2023 23:39   CT HEAD CODE STROKE WO CONTRAST  Result Date: Mar 24, 2023 CLINICAL DATA:  Code stroke. Initial evaluation for neuro deficit, stroke suspected. EXAM: CT HEAD WITHOUT CONTRAST TECHNIQUE: Contiguous axial images were obtained from the base of the skull through the vertex without intravenous contrast. RADIATION DOSE REDUCTION: This exam was performed according to the departmental dose-optimization program which includes automated exposure control, adjustment of the mA and/or kV according to patient size and/or use of iterative reconstruction technique. COMPARISON:  Prior study from 10/21/2021 FINDINGS: Brain: Examination degraded by motion and  streak artifact. Age-related cerebral atrophy with mild chronic microvascular ischemic disease. No acute intracranial hemorrhage. No acute large vessel territory infarct. No mass lesion or midline shift. No hydrocephalus or extra-axial fluid collection. Vascular: No abnormal hyperdense vessel. Calcified atherosclerosis present at the skull base. Skull: Scalp soft tissues within normal limits.  Calvarium intact. Sinuses/Orbits: Globes and orbital soft tissues grossly within normal limits. Visualized paranasal sinuses are clear. Small chronic appearing right mastoid effusion noted, of doubtful significance. Other: None. ASPECTS [facility] Stroke Program Early CT Score) - Ganglionic level infarction (caudate, lentiform nuclei, internal capsule, insula, M1-M3 cortex): 7 - Supraganglionic infarction (M4-M6 cortex): 3 Total score (0-10 with 10 being normal): 10 IMPRESSION: 1. No acute intracranial abnormality. 2. ASPECTS is 10. 3. Age-related cerebral atrophy with mild chronic small vessel ischemic disease. These results were communicated to Dr. Iver Nestle at 10:23 pm on 2023/03/24 by text page via the [facility] messaging system. Electronically Signed   By: Rise Mu M.D.   On: 03-24-2023 22:24    Pending Labs Unresulted Labs (From admission, onward)     Start     Ordered   03/08/23 0500  HIV Antibody (routine testing w rflx)  (HIV Antibody (Routine testing w reflex) panel)  Tomorrow morning,   R        03/07/23 0058   03/08/23 0500  CBC with Differential/Platelet  Tomorrow morning,   R        03/07/23 1126   03/08/23 0500  Comprehensive metabolic panel  Tomorrow  morning,   R        03/07/23 1126   03/08/23 0500  Magnesium  Tomorrow morning,   R        03/07/23 1126   03/08/23 0500  Phosphorus  Tomorrow morning,   R        03/07/23 1126            Vitals/Pain Today's Vitals   03/07/23 1300 03/07/23 1400 03/07/23 1440 03/07/23 1520  BP: (!) 181/97 (!) 171/91  (!) 192/97  Pulse: 84 93  90  Resp:  (!) 21 (!) 21  (!) 27  Temp:   98.1 F (36.7 C)   TempSrc:   Oral   SpO2: 97% 91%  98%  Weight:      PainSc:        Isolation Precautions No active isolations  Medications Medications  atorvastatin (LIPITOR) tablet 20 mg (20 mg Oral Given 03/07/23 1035)  LORazepam (ATIVAN) injection 0.5 mg (0.5 mg Intravenous Given 03/07/23 1519)  labetalol (NORMODYNE) injection 5 mg (has no administration in time range)  haloperidol (HALDOL) tablet 5 mg (has no administration in time range)  gabapentin (NEURONTIN) capsule 100 mg (0 mg Oral Hold 03/07/23 0337)  apixaban (ELIQUIS) tablet 5 mg (5 mg Oral Given 03/07/23 1035)  melatonin tablet 5 mg (has no administration in time range)  polyethylene glycol (MIRALAX / GLYCOLAX) packet 17 g (has no administration in time range)  prochlorperazine (COMPAZINE) injection 5 mg (has no administration in time range)  lactated ringers infusion ( Intravenous New Bag/Given 03/07/23 0354)  valproate (DEPACON) 375 mg in dextrose 5 % 50 mL IVPB (0 mg Intravenous Stopped 03/07/23 1309)  valproate (DEPACON) 250 mg in dextrose 5 % 50 mL IVPB (0 mg Intravenous Stopped 03/07/23 1523)  sodium chloride flush (NS) 0.9 % injection 10 mL (10 mLs Intravenous Given 03/07/23 1057)  aspirin EC tablet 81 mg (81 mg Oral Not Given 03/07/23 1401)  DULoxetine (CYMBALTA) DR capsule 60 mg (60 mg Oral Not Given 03/07/23 1359)  traZODone (DESYREL) tablet 50 mg (has no administration in time range)  benztropine (COGENTIN) tablet 0.5 mg (0 mg Oral Hold 03/07/23 1401)  ARIPiprazole (ABILIFY) tablet 10 mg (10 mg Oral Not Given 03/07/23 1401)  calcitRIOL (ROCALTROL) capsule 0.5 mcg (0.5 mcg Oral Not Given 03/07/23 1402)  magnesium sulfate IVPB 2 g 50 mL (0 g Intravenous Stopped 03/07/23 0831)  magnesium sulfate IVPB 2 g 50 mL (0 g Intravenous Stopped 03/07/23 1518)    Mobility non-ambulatory     Focused Assessments See chart   R Recommendations: See Admitting Provider Note  Report given to:    Additional Notes: see chart

## 2023-03-07 NOTE — ED Notes (Addendum)
Pt yelling out & grabbing towards his IV lines.  Appears to be frustrated & agitated.

## 2023-03-07 NOTE — Care Management Note (Signed)
Case discussed with provider at nursing home,  Joan Flores NP @ [phone] . Apparently patient is with progressive mental status changes and shouting , yelling , restlessness. These changes are evident for long time now. I gave medical updates , will plan for palliative care consultation. If he is not doing well, may even benefit with palliation or hospice.

## 2023-03-07 NOTE — ED Notes (Signed)
Pt returned from MRI. Pt in position of comfort with stretcher locked in lowest position, call bell in reach.

## 2023-03-07 NOTE — Evaluation (Signed)
Clinical/Bedside Swallow Evaluation Patient Details  Name: Matthew Gutierrez MRN: [id number] Date of Birth: 03-Mar-1956  Today's Date: 03/07/2023 Time: SLP Start Time (ACUTE ONLY): 0808 SLP Stop Time (ACUTE ONLY): 7829 SLP Time Calculation (min) (ACUTE ONLY): 15 min  Past Medical History:  Past Medical History:  Diagnosis Date   Absence epileptic syndrome, not intractable, without status epilepticus (HCC) 07/23/2019   Acute renal failure superimposed on stage 3 chronic kidney disease (HCC) 07/23/2019   Anemia, unspecified    Atherosclerotic heart disease 07/23/2019   Atrial fibrillation with RVR (HCC) 12/14/2019   Benign enlargement of prostate 07/23/2019   Bipolar 1 disorder (HCC)    Body mass index 50.0-59.9, adult (HCC) 07/23/2019   Cellulitis of leg 12/14/2019   Chronic respiratory failure with hypoxia (HCC) 07/23/2019   Chronic venous hypertension (idiopathic) with ulcer of left lower extremity (HCC) 07/23/2019   COPD (chronic obstructive pulmonary disease) (HCC) 07/23/2019   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   Diabetic neuropathy (HCC) 07/23/2019   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HCC) 10/15/2019   DVT (deep venous thrombosis) (HCC)    Epilepsy, grand mal (HCC)    Essential hypertension 01/22/2018   Gout attack 10/06/2019   H/O: stroke 01/22/2018   History of DVT (deep vein thrombosis) 01/22/2018   Long term (current) use of anticoagulants 07/23/2019   Lumbago    Lymphedema of both lower extremities 01/22/2018   Mixed hyperlipidemia 07/23/2019   Morbid obesity (HCC) 07/23/2019   Obstructive sleep apnea 07/23/2019   Paroxysmal atrial fibrillation (HCC) 01/07/2020   Peripheral vascular disease, unspecified (HCC) 07/12/2011   Schizophrenia (HCC)    Secondary hypercoagulable state (HCC) 01/07/2020   Shoulder bursitis 07/25/2019   Stroke (HCC)    Tremor    Type 2 diabetes mellitus (HCC)    Past Surgical History:  Past Surgical History:  Procedure Laterality Date   ABOVE  KNEE LEG AMPUTATION Right    CYSTOSTOMY  01/07/2021   ERCP N/A 12/30/2020   Procedure: ENDOSCOPIC RETROGRADE CHOLANGIOPANCREATOGRAPHY (ERCP);  Surgeon: Meryl Dare, MD;  Location: [facility] ENDOSCOPY;  Service: Endoscopy;  Laterality: N/A;   IR PERC CHOLECYSTOSTOMY  12/29/2020   REMOVAL OF STONES  12/30/2020   Procedure: REMOVAL OF STONES;  Surgeon: Meryl Dare, MD;  Location: [facility] ENDOSCOPY;  Service: Endoscopy;;   SPHINCTEROTOMY  12/30/2020   Procedure: Dennison Mascot;  Surgeon: Meryl Dare, MD;  Location: [facility] ENDOSCOPY;  Service: Endoscopy;;   HPI:  Matthew Gutierrez is a 67 y.o. male who presented from SNF as a code stroke. Head CT 30-Mar-2023 negative for acute findings.  MRI could not be completed. CXR 03/30/2023 suggestive of atalectasis. Pt with medical history significant for paroxysmal A-fib on Eliquis, peripheral artery disease status post bilateral above-the-knee amputation, hyperlipidemia, seizure disorder.    Assessment / Plan / Recommendation  Clinical Impression  Pt presents with a moderate oral dysphagia which is suspected to be 2/2 AMS, at least in part.  On SLP arrival there were thick dried secretions and suspected food debris in oral cavity.  SLP set up suction and provided thorough oral care.  Pt mildly resistant to oral care, but maintained open mouth posture.  Pt with congested throat clearing.  Following oral care, pt was able to bring up a large amount of thick secretions which SLP removed from oropharynx with suction.  Pt tolerated thin liquid by spoon and straw sips. Multiple swallows observed with straw sip.  Pt was presented puree by spoon.  He did not attempt to strip bolus from spoon.  SLP placed puree in oral cavity.  Pt with very poor oral transit and SLP removed majority of bolus with suction.  With additional trial of thin liquid pt did not siphon from straw and a small amount of water was provided by spoon for attempted liquid wash.  Pt maintained open mouth posture and did  not exhibite oral response to bolus presentation.  SLP removed via suction.  Belching noted x1. SLP will follow for PO readiness.   Recommend pt remain NPO at this time with alternate means of nutrition, hydration, and medication.  Pt may have sips of water for comfort, after good oral care, in moderation, when pt is awake/alert, with upright positioning and supervision.    A note on positioning, upright positioning may be difficulty to acheive 2/2 B AKA.  In fact, pt was partially reclined for this assessment.  It is unclear how much trunk control interferes wiht upright positioning at baseline. SLP Visit Diagnosis: Dysphagia, oral phase (R13.11)    Aspiration Risk  Moderate aspiration risk    Diet Recommendation NPO         Other  Recommendations Oral Care Recommendations: Oral care prior to ice chip/H20 Caregiver Recommendations: Have oral suction available    Recommendations for follow up therapy are one component of a multi-disciplinary discharge planning process, led by the attending physician.  Recommendations may be updated based on patient status, additional functional criteria and insurance authorization.  Follow up Recommendations Skilled nursing-short term rehab (<3 hours/day)      Assistance Recommended at Discharge  N/A  Functional Status Assessment Patient has had a recent decline in their functional status and demonstrates the ability to make significant improvements in function in a reasonable and predictable amount of time.  Frequency and Duration min 2x/week  2 weeks       Prognosis Prognosis for improved oropharyngeal function: Good      Swallow Study   General Date of Onset: 03-10-23 HPI: Matthew Gutierrez is a 67 y.o. male who presented from SNF as a code stroke. Head CT Mar 10, 2023 negative for acute findings.  MRI could not be completed. CXR 2023/03/10 suggestive of atalectasis. Pt with medical history significant for paroxysmal A-fib on Eliquis, peripheral artery disease  status post bilateral above-the-knee amputation, hyperlipidemia, seizure disorder. Type of Study: Bedside Swallow Evaluation Previous Swallow Assessment: BSE 2022  D3/Thin Diet Prior to this Study: NPO Temperature Spikes Noted: No Respiratory Status: Room air History of Recent Intubation: No Behavior/Cognition: Alert;Doesn't follow directions Oral Cavity Assessment: Excessive secretions;Dried secretions Oral Care Completed by SLP: Yes Oral Cavity - Dentition: Poor condition;Missing dentition Self-Feeding Abilities: Total assist Patient Positioning: Partially reclined Baseline Vocal Quality: Not observed Volitional Cough: Cognitively unable to elicit Volitional Swallow: Unable to elicit    Oral/Motor/Sensory Function Overall Oral Motor/Sensory Function:  (Unable to assess)   Ice Chips Ice chips: Not tested   Thin Liquid Thin Liquid: Impaired Oral Phase Impairments: Poor awareness of bolus Oral Phase Functional Implications: Oral holding    Nectar Thick Nectar Thick Liquid: Not tested   Honey Thick Honey Thick Liquid: Not tested   Puree Puree: Impaired Oral Phase Impairments: Poor awareness of bolus Oral Phase Functional Implications: Oral holding   Solid     Solid: Not tested      Kerrie Pleasure, MA, CCC-SLP Acute Rehabilitation Services Office: [phone] 03/07/2023,8:42 AM

## 2023-03-07 NOTE — ED Notes (Signed)
Patient transported to MRI 

## 2023-03-07 NOTE — Progress Notes (Signed)
Attempted MRI on patient at 0230, pt was moving head and hand all over, reminded to hold still, pt pulled head out of coil.  Not able to obtain any diagnostic images.

## 2023-03-07 NOTE — ED Notes (Signed)
Pt holding medications in his mouth.  Provider made aware.

## 2023-03-08 DIAGNOSIS — G9341 Metabolic encephalopathy: Secondary | ICD-10-CM | POA: Diagnosis present

## 2023-03-08 DIAGNOSIS — I130 Hypertensive heart and chronic kidney disease with heart failure and stage 1 through stage 4 chronic kidney disease, or unspecified chronic kidney disease: Secondary | ICD-10-CM | POA: Diagnosis present

## 2023-03-08 DIAGNOSIS — F0284 Dementia in other diseases classified elsewhere, unspecified severity, with anxiety: Secondary | ICD-10-CM | POA: Diagnosis present

## 2023-03-08 DIAGNOSIS — R1310 Dysphagia, unspecified: Secondary | ICD-10-CM | POA: Diagnosis present

## 2023-03-08 DIAGNOSIS — Z515 Encounter for palliative care: Secondary | ICD-10-CM

## 2023-03-08 DIAGNOSIS — Z7189 Other specified counseling: Secondary | ICD-10-CM

## 2023-03-08 DIAGNOSIS — Z66 Do not resuscitate: Secondary | ICD-10-CM | POA: Diagnosis present

## 2023-03-08 DIAGNOSIS — E1122 Type 2 diabetes mellitus with diabetic chronic kidney disease: Secondary | ICD-10-CM | POA: Diagnosis present

## 2023-03-08 DIAGNOSIS — Z6841 Body Mass Index (BMI) 40.0 and over, adult: Secondary | ICD-10-CM | POA: Diagnosis not present

## 2023-03-08 DIAGNOSIS — G40909 Epilepsy, unspecified, not intractable, without status epilepticus: Secondary | ICD-10-CM

## 2023-03-08 DIAGNOSIS — G40A09 Absence epileptic syndrome, not intractable, without status epilepticus: Secondary | ICD-10-CM | POA: Diagnosis present

## 2023-03-08 DIAGNOSIS — Z89612 Acquired absence of left leg above knee: Secondary | ICD-10-CM

## 2023-03-08 DIAGNOSIS — E1140 Type 2 diabetes mellitus with diabetic neuropathy, unspecified: Secondary | ICD-10-CM | POA: Diagnosis present

## 2023-03-08 DIAGNOSIS — R29717 NIHSS score 17: Secondary | ICD-10-CM | POA: Diagnosis present

## 2023-03-08 DIAGNOSIS — J449 Chronic obstructive pulmonary disease, unspecified: Secondary | ICD-10-CM | POA: Diagnosis present

## 2023-03-08 DIAGNOSIS — R4182 Altered mental status, unspecified: Secondary | ICD-10-CM | POA: Diagnosis not present

## 2023-03-08 DIAGNOSIS — F0283 Dementia in other diseases classified elsewhere, unspecified severity, with mood disturbance: Secondary | ICD-10-CM | POA: Diagnosis present

## 2023-03-08 DIAGNOSIS — I480 Paroxysmal atrial fibrillation: Secondary | ICD-10-CM | POA: Diagnosis present

## 2023-03-08 DIAGNOSIS — N1832 Chronic kidney disease, stage 3b: Secondary | ICD-10-CM | POA: Diagnosis present

## 2023-03-08 DIAGNOSIS — R4701 Aphasia: Secondary | ICD-10-CM | POA: Diagnosis present

## 2023-03-08 DIAGNOSIS — N1831 Chronic kidney disease, stage 3a: Secondary | ICD-10-CM

## 2023-03-08 DIAGNOSIS — I5032 Chronic diastolic (congestive) heart failure: Secondary | ICD-10-CM | POA: Diagnosis present

## 2023-03-08 DIAGNOSIS — G546 Phantom limb syndrome with pain: Secondary | ICD-10-CM | POA: Diagnosis present

## 2023-03-08 DIAGNOSIS — G9340 Encephalopathy, unspecified: Secondary | ICD-10-CM | POA: Diagnosis present

## 2023-03-08 DIAGNOSIS — J9611 Chronic respiratory failure with hypoxia: Secondary | ICD-10-CM | POA: Diagnosis present

## 2023-03-08 DIAGNOSIS — N179 Acute kidney failure, unspecified: Secondary | ICD-10-CM | POA: Diagnosis present

## 2023-03-08 DIAGNOSIS — R627 Adult failure to thrive: Secondary | ICD-10-CM | POA: Diagnosis not present

## 2023-03-08 DIAGNOSIS — Z89611 Acquired absence of right leg above knee: Secondary | ICD-10-CM

## 2023-03-08 DIAGNOSIS — G20C Parkinsonism, unspecified: Secondary | ICD-10-CM | POA: Diagnosis present

## 2023-03-08 DIAGNOSIS — F209 Schizophrenia, unspecified: Secondary | ICD-10-CM | POA: Diagnosis present

## 2023-03-08 LAB — CBC WITH DIFFERENTIAL/PLATELET
Abs Immature Granulocytes: 0.05 10*3/uL (ref 0.00–0.07)
Basophils Absolute: 0.1 10*3/uL (ref 0.0–0.1)
Basophils Relative: 0 %
Eosinophils Absolute: 0.2 10*3/uL (ref 0.0–0.5)
Eosinophils Relative: 2 %
HCT: 49.3 % (ref 39.0–52.0)
Hemoglobin: 15.3 g/dL (ref 13.0–17.0)
Immature Granulocytes: 0 %
Lymphocytes Relative: 17 %
Lymphs Abs: 1.9 10*3/uL (ref 0.7–4.0)
MCH: 29.4 pg (ref 26.0–34.0)
MCHC: 31 g/dL (ref 30.0–36.0)
MCV: 94.8 fL (ref 80.0–100.0)
Monocytes Absolute: 1.2 10*3/uL — ABNORMAL HIGH (ref 0.1–1.0)
Monocytes Relative: 11 %
Neutro Abs: 8 10*3/uL — ABNORMAL HIGH (ref 1.7–7.7)
Neutrophils Relative %: 70 %
Platelets: 164 10*3/uL (ref 150–400)
RBC: 5.2 MIL/uL (ref 4.22–5.81)
RDW: 13.8 % (ref 11.5–15.5)
WBC: 11.4 10*3/uL — ABNORMAL HIGH (ref 4.0–10.5)
nRBC: 0 % (ref 0.0–0.2)

## 2023-03-08 LAB — COMPREHENSIVE METABOLIC PANEL
ALT: 18 U/L (ref 0–44)
AST: 24 U/L (ref 15–41)
Albumin: 3.2 g/dL — ABNORMAL LOW (ref 3.5–5.0)
Alkaline Phosphatase: 40 U/L (ref 38–126)
Anion gap: 14 (ref 5–15)
BUN: 20 mg/dL (ref 8–23)
CO2: 26 mmol/L (ref 22–32)
Calcium: 9.4 mg/dL (ref 8.9–10.3)
Chloride: 102 mmol/L (ref 98–111)
Creatinine, Ser: 1.24 mg/dL (ref 0.61–1.24)
GFR, Estimated: >60 mL/min (ref >60)
Glucose, Bld: 102 mg/dL — ABNORMAL HIGH (ref 70–99)
Potassium: 3.5 mmol/L (ref 3.5–5.1)
Sodium: 142 mmol/L (ref 135–145)
Total Bilirubin: 1.3 mg/dL — ABNORMAL HIGH (ref 0.3–1.2)
Total Protein: 7.2 g/dL (ref 6.5–8.1)

## 2023-03-08 LAB — HEMOGLOBIN A1C
Hgb A1c MFr Bld: 5.8 % — ABNORMAL HIGH (ref 4.8–5.6)
Mean Plasma Glucose: 119.76 mg/dL

## 2023-03-08 LAB — MAGNESIUM: Magnesium: 2.1 mg/dL (ref 1.7–2.4)

## 2023-03-08 LAB — PHOSPHORUS: Phosphorus: 3 mg/dL (ref 2.5–4.6)

## 2023-03-08 LAB — GLUCOSE, CAPILLARY
Glucose-Capillary: 104 mg/dL — ABNORMAL HIGH (ref 70–99)
Glucose-Capillary: 105 mg/dL — ABNORMAL HIGH (ref 70–99)
Glucose-Capillary: 152 mg/dL — ABNORMAL HIGH (ref 70–99)

## 2023-03-08 LAB — HIV ANTIBODY (ROUTINE TESTING W REFLEX): HIV Screen 4th Generation wRfx: NONREACTIVE

## 2023-03-08 MED ORDER — INSULIN ASPART 100 UNIT/ML IJ SOLN
0.0000 [IU] | Freq: Three times a day (TID) | INTRAMUSCULAR | Status: DC
  Administered 2023-03-09 – 2023-03-10 (×4): 1 [IU] via SUBCUTANEOUS

## 2023-03-08 MED ORDER — KETOROLAC TROMETHAMINE 15 MG/ML IJ SOLN
15.0000 mg | Freq: Once | INTRAMUSCULAR | Status: AC
  Administered 2023-03-08: 15 mg via INTRAVENOUS
  Filled 2023-03-08: qty 1

## 2023-03-08 MED ORDER — GABAPENTIN 250 MG/5ML PO SOLN
200.0000 mg | Freq: Three times a day (TID) | ORAL | Status: DC
  Administered 2023-03-10: 200 mg via ORAL
  Filled 2023-03-08 (×8): qty 4

## 2023-03-08 MED ORDER — LACTATED RINGERS IV SOLN: INTRAVENOUS | Status: DC

## 2023-03-08 NOTE — Progress Notes (Signed)
Progress Note   Patient: Matthew Gutierrez YQM:[id number] DOB: 10/16/1955 DOA: 03/21/2023     0 DOS: the patient was seen and examined on 03/08/2023   Brief hospital course: Matthew Gutierrez is a 67 year old male with history of paroxysmal A-fib on Eliquis, peripheral arterial disease status post bilateral above-the-knee amputation, hyperlipidemia, seizure disorder and long-term nursing home resident brought from nursing home with about 2 days of difficulty speaking and less responsiveness. In the emergency room code stroke was called. Initial noncontrast CT head was nonacute. Seen by neurology and recommended admission for altered mentation. Last known well was 2 days ago. In the emergency room, hemodynamically stable. Mostly on 2 L of oxygen   Assessment and Plan: Acute metabolic encephalopathy. Neurology evaluation appreciated. Continue neurochecks. EEG, neurology following MRI, poor quality but no significant abnormalities Continue conservative management.  NPO.  IV fluid support.  Seizure precautions.  Fall precautions.  Avoid all sedative medications. Palliative care consulted. I updated patient's son over phone regarding his current care plan.  Seizure disorder. Depakote, phenytoin levels abnormal. He got Depakote and valproate IV. EEG no active seizures. Continue seizure precautions.  Hypomagnesemia. Magnesium improved status post replacement. Continue to monitor.  Hypophosphatemia-resolved.  Paroxysmal atrial fibrillation. Heart rate well-controlled.  Continue Eliquis.  Hypertension. BP better.  Continue labetalol as needed.  Chronic anxiety and depression. Patient is on Cymbalta, benztropine at home. Will avoid Klonopin, Ativan due to lethargy.  Type 2 diabetes mellitus. Continue Accu-Cheks, sliding scale insulin. Hypoglycemia protocol.  Obesity. Elevated BMI due to AKI.  CKD stage IIIa. Creatinine stable. Avoid nephrotoxic drugs. Gentle IV hydration as he is  NPO. Hold Demadex at this time.  Bilateral AKA. Fall precautions. Supportive care.       Out of bed to chair. Incentive spirometry. Nursing supportive care. Fall, aspiration precautions. DVT prophylaxis   Code Status: Limited: Do not attempt resuscitation (DNR) -DNR-LIMITED -Do Not Intubate/DNI   Subjective: Patient is seen and examined today morning. He is sleepy and lethargic. Can't pass swallow eval. Unable to obtain history.   Physical Exam: Vitals:   03/08/23 1116 03/08/23 1205 03/08/23 1209 03/08/23 1554  BP:  (!) 149/84  (!) 141/80  Pulse:  77  71  Resp: 14 16  14   Temp:  98 F (36.7 C)    TempSrc:  Axillary    SpO2:  90% 97% 98%  Weight:        General - Elderly obese Caucasian male,sleepy and lethargic, no apparent distress HEENT - PERRLA, EOMI, atraumatic head, non tender sinuses. Lung - distant breath sounds, diffuse rales, basal rhonchi, no wheezes. Heart - S1, S2 heard, no murmurs, rubs, b/l AKA Abdomen - Soft, non tender, obese, bowel sounds good Neuro - sleepy and lethargic, unable to do full neuro exam. Skin - Warm and dry. Bilateral AKA.  Data Reviewed:      Latest Ref Rng & Units 03/08/2023    5:17 AM 03/07/2023    3:42 AM 03/17/2023   10:13 PM  CBC  WBC 4.0 - 10.5 K/uL 11.4  11.8    Hemoglobin 13.0 - 17.0 g/dL 52.8  41.3  24.4   Hematocrit 39.0 - 52.0 % 49.3  47.4  52.0   Platelets 150 - 400 K/uL 164  158        Latest Ref Rng & Units 03/08/2023    5:17 AM 03/07/2023    3:42 AM 03/02/2023   10:13 PM  BMP  Glucose 70 - 99 mg/dL 010  272  536  BUN 8 - 23 mg/dL 20  23  27    Creatinine 0.61 - 1.24 mg/dL 4.09  8.11  9.14   Sodium 135 - 145 mmol/L 142  141  143   Potassium 3.5 - 5.1 mmol/L 3.5  3.6  3.9   Chloride 98 - 111 mmol/L 102  100  103   CO2 22 - 32 mmol/L 26  25    Calcium 8.9 - 10.3 mg/dL 9.4  9.1     MR BRAIN WO CONTRAST  Result Date: 03/07/2023 CLINICAL DATA:  67 year old male code stroke presentation yesterday. Delirium.  EXAM: MRI HEAD WITHOUT CONTRAST TECHNIQUE: Multiplanar, multiecho pulse sequences of the brain and surrounding structures were obtained without intravenous contrast. COMPARISON:  Head CT yesterday.  Previous brain MRI 10/21/2021. FINDINGS: The examination had to be discontinued prior to completion due to altered mental status, inability to tolerate despite premedication. Motion degraded DWI, axial T2, FLAIR, T1 imaging, sagittal T1 and coronal T2 imaging provided. ICA siphon flow voids appear preserved. Cerebral volume and morphology appears not significantly changed from last year on axial T2 imaging. Chronic lacunar infarcts in the left basal ganglia, right thalamus. No midline shift, mass effect, or evidence of intracranial mass lesion. No ventriculomegaly. No restricted diffusion is evident. Axial diffusion is especially degraded. Coronal diffusion is of somewhat better quality. Negative visible orbits, paranasal sinuses. IMPRESSION: 1. Limited exam due to significant motion degradation, truncation. 2. No acute intracranial abnormality identified. Chronic small vessel disease in the left basal ganglia and right thalamus. Electronically Signed   By: Odessa Fleming M.D.   On: 03/07/2023 10:08   EEG adult  Result Date: 03/07/2023 Charlsie Quest, MD     03/07/2023  3:47 AM Patient Name: Matthew Gutierrez MRN: [id number] Epilepsy Attending: Charlsie Quest Referring Physician/Provider: Gordy Councilman, MD Date: 03/07/2023 Duration: 25.33 mins Patient history: 67yo M with ams getting eeg to evaluate for seizure Level of alertness: Awake, drowsy AEDs during EEG study: VPA, GBP Technical aspects: This EEG study was done with scalp electrodes positioned according to the 10-20 International system of electrode placement. Electrical activity was reviewed with band pass filter of 1-70Hz , sensitivity of 7 uV/mm, display speed of 81mm/sec with a 60Hz  notched filter applied as appropriate. EEG data were recorded continuously and  digitally stored.  Video monitoring was available and reviewed as appropriate. Description: The posterior dominant rhythm consists of 7 Hz activity of moderate voltage (25-35 uV) seen predominantly in posterior head regions, symmetric and reactive to eye opening and eye closing. Drowsiness was characterized by attenuation of the posterior background rhythm. EEG showed continuous generalized 3 to 6 Hz theta-delta slowing. Hyperventilation and photic stimulation were not performed.   ABNORMALITY - Continuous slow, generalized IMPRESSION: This study is suggestive of moderate diffuse encephalopathy. No seizures or epileptiform discharges were seen throughout the recording. Charlsie Quest   DG Abd 1 View  Result Date: 03/07/2023 CLINICAL DATA:  Metal screening prior to MRI EXAM: ABDOMEN - 1 VIEW COMPARISON:  None Available. FINDINGS: Telemetry leads overlie the abdomen. No radiopaque foreign body to prohibit MRI. Nonobstructive bowel-gas pattern. Vascular calcifications. Degenerative changes pubic symphysis, both hips, SI joints and lower lumbar spine. IMPRESSION: No radiopaque foreign body to prohibit MRI. Electronically Signed   By: Minerva Fester M.D.   On: 03/07/2023 01:34   DG Chest Port 1 View  Result Date: 2023/04/05 CLINICAL DATA:  Altered mental status EXAM: PORTABLE CHEST 1 VIEW COMPARISON:  01/03/2021 FINDINGS: Low lung volumes. Subsegmental  atelectasis at the bases. Enlarged cardiomediastinal silhouette. No pneumothorax. IMPRESSION: Low lung volumes with subsegmental atelectasis at the bases. Electronically Signed   By: Jasmine Pang M.D.   On: Mar 20, 2023 23:39   CT HEAD CODE STROKE WO CONTRAST  Result Date: 03-20-23 CLINICAL DATA:  Code stroke. Initial evaluation for neuro deficit, stroke suspected. EXAM: CT HEAD WITHOUT CONTRAST TECHNIQUE: Contiguous axial images were obtained from the base of the skull through the vertex without intravenous contrast. RADIATION DOSE REDUCTION: This exam was  performed according to the departmental dose-optimization program which includes automated exposure control, adjustment of the mA and/or kV according to patient size and/or use of iterative reconstruction technique. COMPARISON:  Prior study from 10/21/2021 FINDINGS: Brain: Examination degraded by motion and streak artifact. Age-related cerebral atrophy with mild chronic microvascular ischemic disease. No acute intracranial hemorrhage. No acute large vessel territory infarct. No mass lesion or midline shift. No hydrocephalus or extra-axial fluid collection. Vascular: No abnormal hyperdense vessel. Calcified atherosclerosis present at the skull base. Skull: Scalp soft tissues within normal limits.  Calvarium intact. Sinuses/Orbits: Globes and orbital soft tissues grossly within normal limits. Visualized paranasal sinuses are clear. Small chronic appearing right mastoid effusion noted, of doubtful significance. Other: None. ASPECTS [facility] Stroke Program Early CT Score) - Ganglionic level infarction (caudate, lentiform nuclei, internal capsule, insula, M1-M3 cortex): 7 - Supraganglionic infarction (M4-M6 cortex): 3 Total score (0-10 with 10 being normal): 10 IMPRESSION: 1. No acute intracranial abnormality. 2. ASPECTS is 10. 3. Age-related cerebral atrophy with mild chronic small vessel ischemic disease. These results were communicated to Dr. Iver Nestle at 10:23 pm on 2023/03/20 by text page via the [facility] messaging system. Electronically Signed   By: Rise Mu M.D.   On: 2023-03-20 22:24     Family Communication: Discussed with patient's son [name] over phone and updated him about current care plan. He understands and agrees. All questions answereed.    Disposition: Status is: Inpatient Remains inpatient appropriate because: Encephalopathy work up  Planned Discharge Destination: Skilled nursing facility     Time spent: 42 minutes  Author: Marcelino Duster, MD 03/08/2023 4:34 PM Secure chat  7am to 7pm For on call review [email or web address].

## 2023-03-08 NOTE — Progress Notes (Signed)
  Patient's bedside nurse reported that he has back pain and phantom pain to his leg not not controlled.  Patient has been admitted for evaluation of an acute metabolic encephalopathy.  And avoiding sedating medication.   -Patient's nurse reported that during speech evaluation history of good water and cannot give anything by mouth for now. - Function has been improved.  Giving 1 dose of Toradol.  [name], MD [facility] 03/08/2023, 9:39 PM

## 2023-03-08 NOTE — Progress Notes (Signed)
Speech Language Pathology Treatment: Dysphagia  Patient Details Name: Matthew Gutierrez MRN: [id number] DOB: 17-Apr-1956 Today's Date: 03/08/2023 Time: 4098-1191 SLP Time Calculation (min) (ACUTE ONLY): 16 min  Assessment / Plan / Recommendation Clinical Impression  Pt seen for ongoing dysphagia management.  Presentation very similar to that of evaluation on 10/8 in ED.  Pt with thick secretions in oral cavity, xerostomia.  SLP provided oral care with suction prior to administration of PO trials.  Smaller volume of secretions removed during this session, and pt with fairly clear vocal quality at baseline.  Recommend regular, thorough oral care. Pt initially accepted trials of water by teaspoon, straw, and cup.  There was throat clear x1 during serial straw sips.  Multiple swallow noted across all presentation modes. Pt then began orally holding water which was removed vial suction.  There was significant anterior loss from L on final trial by cup.  It is unclear if pt may have expectorated bolus v passive spillage.  There was no oral response to puree. Pt did not strip bolus from spoon.  When puree was placed in oral cavity pt did not manipulate for AP transit and entire bolus was removed by suction.  At present pt is unable to meet his nutritional needs and will likely be unable to take oral medications.  Elixir form is likely to be more successful that crushed medications, but pt may still exhibit oral holding and/or expectoration.  I am very hesitant to recommend long term AMN, but if it is consistent with goals of care, could consider short term AMN until pt is able to resume oral intake.  Recommend pt remain NPO at present.  Consider alternate means of nutrition, hydration, and medication. Pt may have small amounts of water by spoon, in moderation, after good oral care, when fully awake/alert, with upright positioning and 1:1 assistance.  Have suction available to clear oral cavity if pt is holding bolus  trials.    HPI HPI: Matthew Gutierrez is a 67 y.o. male who presented from SNF as a code stroke. Head CT 10/7 negative for acute findings.  MRI could not be completed. CXR 10/7 suggestive of atalectasis. Pt with medical history significant for paroxysmal A-fib on Eliquis, peripheral artery disease status post bilateral above-the-knee amputation, hyperlipidemia, seizure disorder.      SLP Plan  Continue with current plan of care      Recommendations for follow up therapy are one component of a multi-disciplinary discharge planning process, led by the attending physician.  Recommendations may be updated based on patient status, additional functional criteria and insurance authorization.    Recommendations  Diet recommendations: NPO Medication Administration: Via alternative means                  Oral care QID;Staff/trained caregiver to provide oral care;Oral care prior to ice chip/H20     Dysphagia, oral phase (R13.11)     Continue with current plan of care     Matthew Pleasure, MA, CCC-SLP Acute Rehabilitation Services Office: [phone] 03/08/2023, 9:38 AM

## 2023-03-08 NOTE — Progress Notes (Signed)
PT Cancellation Note  Patient Details Name: Matthew Gutierrez MRN: [id number] DOB: 02/05/1956   Cancelled Treatment:    Reason Eval/Treat Not Completed: Patient not medically ready (Pt is currently with AMS and unable to participate in skilled physical therapy services. Pt presents from SNF. Pt currently inappropriate for skilled intervention per medical staff. Will sign off at this time. Please re-consult if further needs arise.)  Harrel Carina, DPT, CLT  Acute Rehabilitation Services Office: [phone] (Secure chat preferred)   Claudia Desanctis 03/08/2023, 8:47 AM

## 2023-03-08 NOTE — Consult Note (Signed)
Consultation Note Date: 03/08/2023   Patient Name: Matthew Gutierrez  DOB: 1955/08/25  MRN: [id number]  Age / Sex: 67 y.o., male  PCP: Mikki Harbor, MD Referring Physician: Marcelino Duster, MD  Reason for Consultation: Establishing goals of care  HPI/Patient Profile: 67 y.o. male  with past medical history of paroxysmal A-fib on Eliquis, peripheral artery disease status post bilateral above-the-knee amputation, hyperlipidemia, seizure disorder admitted on 03/05/2023 with code stroke after aphasia x 2 days.   Patient's last well-known was 2 days prior to presentation.  Noncontrast head CT and MRI were nonacute in the ED.  Neurology recommended EEG and Depakote level, which is subtherapeutic.  Patient admitted for stroke workup and acute metabolic encephalopathy.  Concern for TIA versus seizure.  PMT has been consulted to assist with goals of care conversation and symptom management.  Clinical Assessment and Goals of Care:  I have reviewed medical records including EPIC notes, labs and imaging, assessed the patient and then had a phone conversation with patient's sons [name] and [name] to discuss diagnosis prognosis, GOC, EOL wishes, disposition and options.  I introduced Palliative Medicine as specialized medical care for people living with serious illness. It focuses on providing relief from the symptoms and stress of a serious illness. The goal is to improve quality of life for both the patient and the family.  We discussed a brief life review of the patient and then focused on their current illness.  The natural disease trajectory and expectations at EOL were discussed.  I attempted to elicit values and goals of care important to the patient.    Medical History Review and Understanding:  We reviewed patient's acute illness in the context of his chronic comorbidities.  Reviewed current care plan, as well as  concern for long-term prognosis given chronic overall worsening of mental status due to underlying conditions.  Social History: Patient is divorced.  He has 2 sons.  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  He previously worked as an Arboriculturist and Naval architect.  He is described as having a big heart, willing to help anyone, while also hardheaded.  He has 3 sisters who are very supportive, though 1 has cancer and not as able to participate lately.  Functional and Nutritional State: Patient is bedbound.  A couple of years ago he was using a walker at his facility.  Family laments that he is not being assisted out of bed frequently despite their request, while they can also understand the difficulty in assisting with this given staffing constraints.  He enjoys eating and food is important to his quality of life.  Albumin of 3.2 noted.  Palliative Symptoms: Pain, anxiety, depression  Advance Directives: A detailed discussion regarding advanced directives was had.  Family reports he has no advance directives completed.  Code Status: Concepts specific to code status, artifical feeding and hydration, and rehospitalization were considered and discussed.   Discussion: Patient's son's note that he has been declining over the past few months, noting that they have not had any end-of-life conversations or discussions about his care preferences given hesitancy to stress him or worry him.  They are not sure how much he would even understand this type of conversation, as he has always been a "child in an adult body."  They feel will be helpful to include his sisters, as they are around him more frequently and might have some insight into how to tactfully approach  this type of conversation.  They are not certain whether he finds his current quality of life to be acceptable, though they are pleased to hear his pain is being managed.  They would be  interested in the outcome of additional diagnostics for now, as well as potentially encouraging patient to participate more in physical therapy.  He retired due to health problems 12 years ago and has not really cared for himself since then.  They understand that they can only do so much if he does not participate, though having difficulty witnessing how he has "given up." We discussed their thoughts and feelings on options such as hospice, whether patient would prefer to avoid further hospitalizations.  He actually has a MOST form at his facility that says do not hospitalize, but they are not sure if they would like to keep this in place as they still want to give him the chance.  They are open to ongoing discussions pending clinical course.   Discussed the importance of continued conversation with family and the medical providers regarding overall plan of care and treatment options, ensuring decisions are within the context of the patient's values and GOCs.   Questions and concerns were addressed. The family was encouraged to call with questions or concerns.  PMT will continue to support holistically.   SUMMARY OF RECOMMENDATIONS   -Continue DNR/DNI -Continue current care -Patient's sons are uncertain about his overall quality of life, though hopeful he may participate more with therapies and show willingness to improve -Shared family request for medical update with primary attending -Ongoing goals of care discussions pending clinical course -Psychosocial and emotional support provided -PMT will continue to follow and support  Prognosis:  Unable to determine  Discharge Planning: To Be Determined      Primary Diagnoses: Present on Admission:  AMS (altered mental status)  Encephalopathy   Physical Exam Vitals and nursing note reviewed.  Constitutional:      General: He is not in acute distress.    Appearance: He is ill-appearing.  Cardiovascular:     Rate and Rhythm: Normal rate.   Pulmonary:     Effort: Pulmonary effort is normal.  Neurological:     Mental Status: He is lethargic.  Psychiatric:        Cognition and Memory: Cognition is impaired.    Vital Signs: BP (!) 156/86   Pulse 85   Temp 98.1 F (36.7 C) (Oral)   Resp 18   Wt 93.6 kg   SpO2 93%   BMI 78.46 kg/m  Pain Scale: PAINAD   Pain Score: Asleep   SpO2: SpO2: 93 % O2 Device:SpO2: 93 % O2 Flow Rate: .O2 Flow Rate (L/min): 0 L/min   Palliative Assessment/Data: 30%      Total time: I spent 75 minutes in the care of the patient today in the above activities and documenting the encounter.   Rylei Masella Jeni Salles, PA-C  Palliative Medicine Team Team phone # [phone]  Thank you for allowing the Palliative Medicine Team to assist in the care of this patient. Please utilize secure chat with additional questions, if there is no response within 30 minutes please call the above phone number.  Palliative Medicine Team providers are available by phone from 7am to 7pm daily and can be reached through the team cell phone.  Should this patient require assistance outside of these hours, please call the patient's attending physician.

## 2023-03-08 NOTE — Plan of Care (Signed)

## 2023-03-08 NOTE — Progress Notes (Signed)
OT Cancellation Note and Discharge  Patient Details Name: Matthew Gutierrez MRN: [id number] DOB: [birth year]   Cancelled Treatment:    Reason Eval/Treat Not Completed: OT screened after reviewing EMR. Per chart Pt is bed bound at baseline, long term facility resident - where they have access to mechanical lift and had been using it. Also note high probability that team will be pursuing hospice services. OT will sign off at this time, should situation change or condition change please feel free to re-order.  Evern [name] 03/08/2023, 8:45 AM  Nyoka Cowden OTR/L Acute Rehabilitation Services Office: [phone]

## 2023-03-08 NOTE — Plan of Care (Signed)
  Problem: Education: Goal: Knowledge of General Education information will improve Description: Including pain rating scale, medication(s)/side effects and non-pharmacologic comfort measures Outcome: Progressing   Problem: Clinical Measurements: Goal: Will remain free from infection Outcome: Progressing Goal: Diagnostic test results will improve Outcome: Progressing   

## 2023-03-09 ENCOUNTER — Other Ambulatory Visit: Payer: Self-pay

## 2023-03-09 DIAGNOSIS — I480 Paroxysmal atrial fibrillation: Secondary | ICD-10-CM | POA: Diagnosis not present

## 2023-03-09 DIAGNOSIS — R627 Adult failure to thrive: Secondary | ICD-10-CM

## 2023-03-09 DIAGNOSIS — R4182 Altered mental status, unspecified: Secondary | ICD-10-CM | POA: Diagnosis not present

## 2023-03-09 DIAGNOSIS — N1831 Chronic kidney disease, stage 3a: Secondary | ICD-10-CM | POA: Diagnosis not present

## 2023-03-09 DIAGNOSIS — Z7189 Other specified counseling: Secondary | ICD-10-CM | POA: Diagnosis not present

## 2023-03-09 DIAGNOSIS — Z515 Encounter for palliative care: Secondary | ICD-10-CM | POA: Diagnosis not present

## 2023-03-09 DIAGNOSIS — G9341 Metabolic encephalopathy: Secondary | ICD-10-CM | POA: Diagnosis not present

## 2023-03-09 LAB — GLUCOSE, CAPILLARY
Glucose-Capillary: 121 mg/dL — ABNORMAL HIGH (ref 70–99)
Glucose-Capillary: 158 mg/dL — ABNORMAL HIGH (ref 70–99)
Glucose-Capillary: 149 mg/dL — ABNORMAL HIGH (ref 70–99)
Glucose-Capillary: 138 mg/dL — ABNORMAL HIGH (ref 70–99)

## 2023-03-09 MED ORDER — HYDRALAZINE HCL 20 MG/ML IJ SOLN
10.0000 mg | Freq: Four times a day (QID) | INTRAMUSCULAR | Status: DC | PRN
  Administered 2023-03-09 – 2023-03-10 (×3): 10 mg via INTRAVENOUS
  Filled 2023-03-09 (×3): qty 1

## 2023-03-09 MED ORDER — KETOROLAC TROMETHAMINE 15 MG/ML IJ SOLN
15.0000 mg | Freq: Once | INTRAMUSCULAR | Status: AC
  Administered 2023-03-09: 15 mg via INTRAVENOUS
  Filled 2023-03-09: qty 1

## 2023-03-09 MED ORDER — SODIUM CHLORIDE 0.9% FLUSH
3.0000 mL | Freq: Two times a day (BID) | INTRAVENOUS | Status: DC
  Administered 2023-03-09 – 2023-03-11 (×6): 3 mL via INTRAVENOUS

## 2023-03-09 NOTE — Progress Notes (Signed)
Daily Progress Note   Patient Name: Matthew Gutierrez       Date: 03/09/2023 DOB: 1955-07-06  Age: 67 y.o. MRN#: [id number] Attending Physician: Marcelino Duster, MD Primary Care Physician: Mikki Harbor, MD Admit Date: 24-Mar-2023  Reason for Consultation/Follow-up: Establishing goals of care  Subjective: Medical records reviewed including progress notes, labs, and imaging. Received call from patient's sister on PMT phone line and returned call at her request. Introduced role of palliative medicine consult service, counseling on the difference between palliative care and hospice.  Created space and opportunity for sister Matthew Gutierrez's thoughts and feelings on his current illness. She understands that he has been "going downhill for quite some time," though even more drastically over the past few days to 1 week. She has been his primary caregiver and support person, though she defers final decisions to patient's sons. His quality of life is very poor and Matthew Gutierrez notes that "eating is all that he has." Provided update on ongoing difficulties with dysphagia and possible decision for temporary artificial nutrition, advising against any form of permanent artificial nutrition given his baseline medical status and concern for lack of improvement in overall prognosis. She agrees and states this would not be what he wants. Emotional support and therapeutic listening provided as she described how heart-breaking the situation is. She feels he has "held out as long as he is going to" and wants to focus on avoiding further suffering. She feels hospice philosophy would be appropriate at this point in patient's illness, while understanding it is ultimately his sons' decision.   Patient was then assessed at the bedside.  He is lethargic, though more alert than yesterday. He nods yes when asked if he is in pain, unable to indicate a severity level or location.   I called patient's son Matthew Gutierrez for ongoing goals of care conversations and palliative support.  We reviewed his conversation with patient's primary attending yesterday, as well as my conversation with his aunt.  We discussed that patient has not received any of his regularly scheduled medications due to n.p.o. status, as well as results for SLP evaluation today.  Matthew Gutierrez is still brainstorming possibilities to help patient and encourage his improvement, though he understands that if mentation/dysphagia does not improve then patient may benefit more from comfort feeds/hospice.  The risks and benefits of temporary  artificial nutrition were discussed.  While concerned about the pain this may cause the patient, he is open to proceeding with a trial period.  He agrees it would not be beneficial for patient to receive PEG.  We discussed his concern about patient sensitivity to bed positioning and I relayed to LPN Courtney.  Matthew Gutierrez also understands that opioids had now been discontinued due to concern for sedation.  Questions and concerns addressed. PMT will continue to support holistically.   Length of Stay: 1   Physical Exam Vitals and nursing note reviewed.  Constitutional:      General: He is not in acute distress. Cardiovascular:     Rate and Rhythm: Normal rate.  Pulmonary:     Effort: Pulmonary effort is normal. No respiratory distress.  Skin:    General: Skin is warm and dry.  Psychiatric:        Behavior: Behavior is slowed.        Cognition and Memory: Cognition is impaired.            Vital Signs: BP (!) 182/113 (BP Location: Left Arm)   Pulse 93   Temp 98 F (36.7 C) (Oral)   Resp 17   Wt 93.6 kg   SpO2 92%   BMI 78.46 kg/m  SpO2: SpO2: 92 % O2 Device: O2 Device: Room Air O2 Flow Rate: O2 Flow Rate (L/min): 1 L/min      Palliative  Assessment/Data: 10-20% (npo)   Palliative Care Assessment & Plan   Patient Profile: 67 y.o. male  with past medical history of paroxysmal A-fib on Eliquis, peripheral artery disease status post bilateral above-the-knee amputation, hyperlipidemia, seizure disorder admitted on 03/26/2023 with code stroke after aphasia x 2 days.    Patient's last well-known was 2 days prior to presentation.  Noncontrast head CT and MRI were nonacute in the ED.  Neurology recommended EEG and Depakote level, which is subtherapeutic.  Patient admitted for stroke workup and acute metabolic encephalopathy.  Concern for TIA versus seizure.   PMT has been consulted to assist with goals of care conversation and symptom management.  Assessment: Goals of care conversation Acute metabolic encephalopathy Seizure disorder Debility and deconditioning Failure to thrive  Recommendations/Plan: Continue DNR/DNI Continue current care.  Patient's son Matthew Gutierrez is agreeable to cortrak placement, but NO PEG Ongoing goals of care discussions pending clinical course.  Possible transition to comfort care if patient does not improve with additional time for outcomes Psychosocial and emotional support provided PMT will continue to follow and support   Prognosis:  Poor  Discharge Planning: To Be Determined  Care plan was discussed with patient, MD, patient's son, patient's sister   Total time: I spent 65 minutes in the care of the patient today in the above activities and documenting the encounter.  MDM High         Shylo Zamor Jeni Salles, PA-C  Palliative Medicine Team Team phone # [phone]  Thank you for allowing the Palliative Medicine Team to assist in the care of this patient. Please utilize secure chat with additional questions, if there is no response within 30 minutes please call the above phone number.  Palliative Medicine Team providers are available by phone from 7am to 7pm daily and can be reached through  the team cell phone.  Should this patient require assistance outside of these hours, please call the patient's attending physician.

## 2023-03-09 NOTE — Progress Notes (Signed)
Speech Language Pathology Treatment: Dysphagia  Patient Details Name: Matthew Gutierrez MRN: [id number] DOB: Jun 19, 1955 Today's Date: 03/09/2023 Time: 1601-0932 SLP Time Calculation (min) (ACUTE ONLY): 7 min  Assessment / Plan / Recommendation Clinical Impression  Pt seen for ongoing dysphagia management.  Dried secretions in oral cavity. SLP provided oral care. Pt remains mildly resistant to oral care.  There was some suspicious for oral bleeding with pink toothbrush on removal, but unable to visualize. Continue regular oral care. Pt may benefit from mouth moisture, in addition to small amounts of water to help keep oral mucosa moist and reduce dried secretions in oral cavity. Today pt exhibited oral response to puree and achieved oral transit.  There were multiple swallows observed and oral residue.  Suspect piecemeal deglutition over pharyngeal residue. SLP did have to assist with placement of puree in oral cavity, though pt did attempt to strip bolus from spoon. Pt had difficulty coordinating siphoning from straw following puree trials.  There was intermittent anterior loss with thin liquid by cup and spoon.  There was immediate cough following large straw sips of thin liquid.  Though pt's performance today indicates an improvement from previous sessions, he still does not appear appropriate for PO diet and today's clinical presentation raises concerns for pharyngeal dysphagia in addition to a cognitively based oral dysphagia.  Pt's effortful oral transit of puree suggests true oral deficits as well, rather than solely inattention.  Pt is not yet ready for instrumental swallowing assessment.  Pt is also unlikely to meet his nutritional needs orally even if there is no pharyngeal dysphagia.  Pt still without AMN.  Palliative care has spoken with family.  I would recommend against long term AMN at this time for this pt; however, if it is consistent with goals of care, consider short term AMN.  Recommend pt  remain NPO with alternate means of nutrition, hydration, and medication. Pt may have ice chips and small amounts of water by spoon in moderation, after good oral care, when fully awake/alert, with upright positioning and 1:1 assistance.     HPI HPI: Matthew Gutierrez is a 67 y.o. male who presented from SNF as a code stroke. Head CT 10/7 negative for acute findings.  MRI could not be completed. CXR 10/7 suggestive of atalectasis. Pt with medical history significant for paroxysmal A-fib on Eliquis, peripheral artery disease status post bilateral above-the-knee amputation, hyperlipidemia, seizure disorder.      SLP Plan  Continue with current plan of care      Recommendations for follow up therapy are one component of a multi-disciplinary discharge planning process, led by the attending physician.  Recommendations may be updated based on patient status, additional functional criteria and insurance authorization.    Recommendations  Diet recommendations: NPO Medication Administration: Via alternative means                  Oral care QID;Staff/trained caregiver to provide oral care;Oral care prior to ice chip/H20     Dysphagia, oral phase (R13.11)     Continue with current plan of care     Kerrie Pleasure, MA, CCC-SLP Acute Rehabilitation Services Office: [phone] 03/09/2023, 10:15 AM

## 2023-03-09 NOTE — Progress Notes (Signed)
Received a telephone call from dayshift nurse who informed me that a consult may need to be placed for cortrak.  No one on under cortrak team for today.  Matthew Gutierrez on for tomorrow 10/11.

## 2023-03-09 NOTE — Progress Notes (Addendum)
Initial Nutrition Assessment    INTERVENTION:  Initiate tube feeding via Cortrak tube: Jevity 1.5 at 50 ml/h (1200 ml per day) Provides 1800 kcal, 77 gm protein, 912 ml free water daily Additional free water flushes of 150 mL Q4 hr, total water provided 1812 mL.   Initiate TF of rate of 20 mL/hr, advance by 10mL Q6 hr until rate of 50 mL/hr.  Monitor magnesium and phosphorus every 12 hours x 4 occurrences, MD to replete as needed, as pt is at possible risk for refeeding syndrome   NUTRITION DIAGNOSIS:   Inadequate oral intake related to dysphagia as evidenced by NPO status.    GOAL:   Patient will meet greater than or equal to 90% of their needs    MONITOR:   Weight trends, TF tolerance, Skin, Labs  REASON FOR ASSESSMENT:   Consult Enteral/tube feeding initiation and management  ASSESSMENT:   67 y/o male presented from SNF as a code stroke. Acute metabolic encephalopathy. SLP recommend NPO, alternative nutrition support. Family is considering transition to comfort care. PMH: A-fib, peripheral artery disease S/P bilateral above the knee amputation, HLD, seizure disorder, acute renal failure on CKD stage III, anemia, atherosclerotic heart disease, bipolar disorder, chronic respiratory failure with hypoxia, COPD, DM2, diabetic glomerulopathy, diabetic neuropathy, diabetic vasculopathy, HTN, gout, stroke, lymphedema bilateral lower extremities, morbid obesity, OSA, schizophrenia, tremor     10/11 CORTRAK placement   Medications: calcitriol 0.5 mcg daily, insulin aspart 0-6 units tid, prn miralax, compazine, Labs reviewed. CBG ranges 121-165 past 24 hr, hgb A1C 5.7%  Visited patient, he is non-verbal today, did acknowledge and nodded.  Family not at bedside. SLP recommended NPO, alternative means of nutrition. NPO x 3 days, unknown nutrition hx, CORTRAK placed, TF to be started. Noted weight loss of 10% within two months, question accuracy of weights, limited recent weight hx  in patient with CKD.     NUTRITION - FOCUSED PHYSICAL EXAM:  Flowsheet Row Most Recent Value  Orbital Region No depletion  Upper Arm Region No depletion  Thoracic and Lumbar Region Unable to assess  Buccal Region No depletion  Temple Region Moderate depletion  Clavicle Bone Region No depletion  Clavicle and Acromion Bone Region No depletion  Scapular Bone Region No depletion  Dorsal Hand No depletion  Patellar Region Unable to assess  Anterior Thigh Region Unable to assess  Posterior Calf Region Unable to assess  Edema (RD Assessment) Mild  [left hand edema]  Hair Reviewed  Eyes Unable to assess  Mouth Reviewed  [poor dentitian]  Skin Reviewed  Nails Reviewed       Diet Order:   Diet Order             Diet NPO time specified  Diet effective now                   EDUCATION NEEDS:   Not appropriate for education at this time  Skin:  Skin Assessment: Reviewed RN Assessment (redness to coccyx, groin, buttocks)  Last BM:  03/09/23-type 7-large  Height:   Ht Readings from Last 1 Encounters:  03/10/23 6' (1.829 m)    Weight:   Wt Readings from Last 1 Encounters:  03/10/2023 93.6 kg    Ideal Body Weight:  68 kg (adjustment of IBW by 16% to account for bilateral above knee amputations)  BMI:  Body mass index is 27.99 kg/m.  Estimated Nutritional Needs:   Kcal:  1711-1860 kcal/day  Protein:  75-88 gm/day  Fluid:  8841-6606 mL/day    Alvino Chapel, RDLD

## 2023-03-09 NOTE — Progress Notes (Signed)
Progress Note   Patient: Matthew Gutierrez JSE:[id number] DOB: 06-01-1955 DOA: March 17, 2023     1 DOS: the patient was seen and examined on 03/09/2023   Brief hospital course: Matthew Gutierrez is a 67 year old male with history of paroxysmal A-fib on Eliquis, peripheral arterial disease status post bilateral above-the-knee amputation, hyperlipidemia, seizure disorder and long-term nursing home resident brought from nursing home with about 2 days of difficulty speaking and less responsiveness. In the emergency room code stroke was called. Initial noncontrast CT head was nonacute. Seen by neurology and recommended admission for altered mentation. Last known well was 2 days ago. In the emergency room, hemodynamically stable on 2 L oxygen.   During hospital stay, he was more sleepy, lethargic, got narcan yesterday, woke up. He is agitated and yelling. Seen by SLP advised strict NPO. Palliative team involved who discussed with son who agreed for cortrak but no PEG tube and consider transition to comfort care if no improvement.  Assessment and Plan: Acute metabolic encephalopathy. Neurology evaluation appreciated. Continue neurochecks. EEG no seizure activity. MRI, poor quality but no significant abnormalities Continue conservative management.  NPO.  IV fluid support.   Seizure precautions.  Fall precautions.  Avoid sedative and opiate medications. I updated son regarding current care plan. Son asked to continue pain meds for his phantom limb pain. Palliative care on board discussed with son who agreed with cortrak, consider transition to comfort care if no improvement. Enteral Tube placement, nutrition eval for feeds ordered.  Seizure disorder. Depakote, phenytoin levels abnormal. continue valproate IV. EEG no active seizures. Continue seizure precautions.  Hypomagnesemia. Magnesium improved status post replacement. Continue to monitor.  Hypophosphatemia-resolved.  Paroxysmal atrial  fibrillation. Heart rate well-controlled.  Continue Eliquis.  Hypertension. BP better.  Continue labetalol as needed.  Chronic anxiety and depression. Patient is on Cymbalta, benztropine at home. Avoid Klonopin, Ativan if lethargic.  Type 2 diabetes mellitus. Continue Accu-Cheks, sliding scale insulin. Hypoglycemia protocol.  Obesity. Elevated BMI due to AKI.  CKD stage IIIa. Creatinine stable. Avoid nephrotoxic drugs. Will start NG feeds per dietician. Hold demedex for now/  Bilateral AKA. Fall precautions. Supportive care.       Out of bed to chair. Incentive spirometry. Nursing supportive care. Fall, aspiration precautions. DVT prophylaxis   Code Status: Limited: Do not attempt resuscitation (DNR) -DNR-LIMITED -Do Not Intubate/DNI   Subjective: Patient is seen and examined today morning. He is alert, awake, not following commands. At times agitated, yells. Can't pass swallow eval. Remains NPO.   Physical Exam: Vitals:   03/09/23 0616 03/09/23 0751 03/09/23 0831 03/09/23 1013  BP: 116/73 (!) 182/113 (!) 179/116 (!) 157/100  Pulse: 99 93 93   Resp:  17    Temp: 98.5 F (36.9 C) 98 F (36.7 C) 98 F (36.7 C) 98 F (36.7 C)  TempSrc: Oral Oral Oral Oral  SpO2: 95% 92% 94% 96%  Weight:        General - Elderly obese Caucasian male, no apparent distress HEENT - PERRLA, EOMI, atraumatic head, non tender sinuses. Lung - distant breath sounds, diffuse rales, basal rhonchi, no wheezes. Heart - S1, S2 heard, no murmurs, rubs, b/l AKA Abdomen - Soft, non tender, obese, bowel sounds good Neuro - Alert, awake, does not follow commands, unable to do full neuro exam. Skin - Warm and dry. Bilateral AKA.  Data Reviewed:      Latest Ref Rng & Units 03/08/2023    5:17 AM 03/07/2023    3:42 AM 03/17/2023   10:13  PM  CBC  WBC 4.0 - 10.5 K/uL 11.4  11.8    Hemoglobin 13.0 - 17.0 g/dL 93.8  18.2  99.3   Hematocrit 39.0 - 52.0 % 49.3  47.4  52.0   Platelets 150 -  400 K/uL 164  158        Latest Ref Rng & Units 03/08/2023    5:17 AM 03/07/2023    3:42 AM 02/28/2023   10:13 PM  BMP  Glucose 70 - 99 mg/dL 716  967  893   BUN 8 - 23 mg/dL 20  23  27    Creatinine 0.61 - 1.24 mg/dL 8.10  1.75  1.02   Sodium 135 - 145 mmol/L 142  141  143   Potassium 3.5 - 5.1 mmol/L 3.5  3.6  3.9   Chloride 98 - 111 mmol/L 102  100  103   CO2 22 - 32 mmol/L 26  25    Calcium 8.9 - 10.3 mg/dL 9.4  9.1     No results found.   Family Communication: Discussed with patient's son [name] over phone and updated him about current care plan. He understands and agrees. All questions answereed.    Disposition: Status is: Inpatient Remains inpatient appropriate because: Encephalopathy work up, NG feeds, safe dispo, possible comfort care if no clinical improvement  Planned Discharge Destination: Skilled nursing facility     Time spent: 40 minutes  Author: Marcelino Duster, MD 03/09/2023 1:52 PM Secure chat 7am to 7pm For on call review [email or web address].

## 2023-03-10 ENCOUNTER — Inpatient Hospital Stay (HOSPITAL_COMMUNITY): Payer: Medicare Other

## 2023-03-10 DIAGNOSIS — Z7189 Other specified counseling: Secondary | ICD-10-CM | POA: Diagnosis not present

## 2023-03-10 DIAGNOSIS — R4182 Altered mental status, unspecified: Secondary | ICD-10-CM | POA: Diagnosis not present

## 2023-03-10 DIAGNOSIS — Z515 Encounter for palliative care: Secondary | ICD-10-CM | POA: Diagnosis not present

## 2023-03-10 LAB — MAGNESIUM
Magnesium: 2.3 mg/dL (ref 1.7–2.4)
Magnesium: 1.6 mg/dL — ABNORMAL LOW (ref 1.7–2.4)

## 2023-03-10 LAB — GLUCOSE, CAPILLARY
Glucose-Capillary: 165 mg/dL — ABNORMAL HIGH (ref 70–99)
Glucose-Capillary: 142 mg/dL — ABNORMAL HIGH (ref 70–99)
Glucose-Capillary: 155 mg/dL — ABNORMAL HIGH (ref 70–99)
Glucose-Capillary: 160 mg/dL — ABNORMAL HIGH (ref 70–99)
Glucose-Capillary: 185 mg/dL — ABNORMAL HIGH (ref 70–99)

## 2023-03-10 LAB — PHOSPHORUS
Phosphorus: 1.8 mg/dL — ABNORMAL LOW (ref 2.5–4.6)
Phosphorus: 2.4 mg/dL — ABNORMAL LOW (ref 2.5–4.6)

## 2023-03-10 MED ORDER — CALCITRIOL 0.5 MCG PO CAPS: 0.5000 ug | ORAL_CAPSULE | Freq: Every day | ORAL | Status: DC

## 2023-03-10 MED ORDER — HALOPERIDOL 1 MG PO TABS: 5.0000 mg | ORAL_TABLET | Freq: Three times a day (TID) | ORAL | Status: DC | PRN

## 2023-03-10 MED ORDER — ARIPIPRAZOLE 5 MG PO TABS
5.0000 mg | ORAL_TABLET | Freq: Every day | ORAL | Status: DC
  Administered 2023-03-10 – 2023-03-11 (×2): 5 mg
  Filled 2023-03-10 (×2): qty 1

## 2023-03-10 MED ORDER — CALCITRIOL 0.5 MCG PO CAPS
0.5000 ug | ORAL_CAPSULE | Freq: Every day | ORAL | Status: DC
  Administered 2023-03-10: 0.5 ug
  Filled 2023-03-10 (×2): qty 1

## 2023-03-10 MED ORDER — JEVITY 1.5 CAL/FIBER PO LIQD
1000.0000 mL | ORAL | Status: DC
  Administered 2023-03-10 – 2023-03-11 (×2): 1000 mL
  Filled 2023-03-10 (×3): qty 1000

## 2023-03-10 MED ORDER — APIXABAN 5 MG PO TABS: 5.0000 mg | ORAL_TABLET | Freq: Two times a day (BID) | ORAL | Status: DC

## 2023-03-10 MED ORDER — ASPIRIN 81 MG PO CHEW
81.0000 mg | CHEWABLE_TABLET | Freq: Every day | ORAL | Status: DC
  Administered 2023-03-11: 81 mg
  Filled 2023-03-10: qty 1

## 2023-03-10 MED ORDER — GABAPENTIN 250 MG/5ML PO SOLN
200.0000 mg | Freq: Three times a day (TID) | ORAL | Status: DC
  Administered 2023-03-10 – 2023-03-11 (×4): 200 mg
  Filled 2023-03-10 (×6): qty 6

## 2023-03-10 MED ORDER — BENZTROPINE MESYLATE 0.5 MG PO TABS: 0.5000 mg | ORAL_TABLET | Freq: Two times a day (BID) | ORAL | Status: DC

## 2023-03-10 MED ORDER — TRAZODONE HCL 50 MG PO TABS: 50.0000 mg | ORAL_TABLET | Freq: Every day | ORAL | Status: DC

## 2023-03-10 MED ORDER — BENZTROPINE MESYLATE 0.5 MG PO TABS
0.5000 mg | ORAL_TABLET | Freq: Two times a day (BID) | ORAL | Status: DC
  Administered 2023-03-10 – 2023-03-11 (×3): 0.5 mg
  Filled 2023-03-10 (×4): qty 1

## 2023-03-10 MED ORDER — ATORVASTATIN CALCIUM 10 MG PO TABS
20.0000 mg | ORAL_TABLET | Freq: Every day | ORAL | Status: DC
  Administered 2023-03-10 – 2023-03-11 (×2): 20 mg
  Filled 2023-03-10 (×3): qty 2

## 2023-03-10 MED ORDER — ARIPIPRAZOLE 5 MG PO TABS: 5.0000 mg | ORAL_TABLET | Freq: Every day | ORAL | Status: DC

## 2023-03-10 MED ORDER — FREE WATER
150.0000 mL | Status: DC
  Administered 2023-03-10 – 2023-03-12 (×9): 150 mL

## 2023-03-10 MED ORDER — GABAPENTIN 250 MG/5ML PO SOLN
200.0000 mg | Freq: Three times a day (TID) | ORAL | Status: DC
  Filled 2023-03-10: qty 6

## 2023-03-10 MED ORDER — ATORVASTATIN CALCIUM 10 MG PO TABS: 20.0000 mg | ORAL_TABLET | Freq: Every day | ORAL | Status: DC

## 2023-03-10 MED ORDER — APIXABAN 5 MG PO TABS
5.0000 mg | ORAL_TABLET | Freq: Two times a day (BID) | ORAL | Status: DC
  Administered 2023-03-10 – 2023-03-11 (×3): 5 mg
  Filled 2023-03-10 (×3): qty 1

## 2023-03-10 MED ORDER — POLYETHYLENE GLYCOL 3350 17 G PO PACK: 17.0000 g | PACK | Freq: Every day | ORAL | Status: DC | PRN

## 2023-03-10 MED ORDER — TRAZODONE HCL 50 MG PO TABS
50.0000 mg | ORAL_TABLET | Freq: Every day | ORAL | Status: DC
  Administered 2023-03-10 – 2023-03-11 (×2): 50 mg
  Filled 2023-03-10 (×2): qty 1

## 2023-03-10 MED ORDER — MELATONIN 5 MG PO TABS: 5.0000 mg | ORAL_TABLET | Freq: Every evening | ORAL | Status: DC | PRN

## 2023-03-10 MED ORDER — KETOROLAC TROMETHAMINE 15 MG/ML IJ SOLN
15.0000 mg | Freq: Once | INTRAMUSCULAR | Status: AC
  Administered 2023-03-10: 15 mg via INTRAVENOUS
  Filled 2023-03-10: qty 1

## 2023-03-10 NOTE — Plan of Care (Signed)
°  Problem: Clinical Measurements: °Goal: Ability to maintain clinical measurements within normal limits will improve °Outcome: Progressing °Goal: Will remain free from infection °Outcome: Progressing °Goal: Diagnostic test results will improve °Outcome: Progressing °Goal: Respiratory complications will improve °Outcome: Progressing °  °

## 2023-03-10 NOTE — Progress Notes (Signed)
Progress Note   Patient: Matthew Gutierrez XBM:[id number] DOB: 1956-04-02 DOA: 03/19/2023     2 DOS: the patient was seen and examined on 03/10/2023   Brief hospital course: 67 year old male with history of paroxysmal A-fib on Eliquis, peripheral arterial disease status post bilateral above-the-knee amputation, hyperlipidemia, seizure disorder and long-term nursing home resident brought from nursing home with about 2 days of difficulty speaking and less responsiveness. In the emergency room code stroke was called. Initial noncontrast CT head was nonacute. Seen by neurology and recommended admission for altered mentation.   During hospital stay, he was more sleepy, lethargic.  Seen by SLP advised strict NPO. Palliative team involved who discussed with son who agreed for cortrak but no PEG tube and consider transition to comfort care if no improvement.  Cortrak being placed 10/11.  Assessment and Plan: Acute metabolic encephalopathy. Neurology evaluation appreciated. EEG no seizure activity. MRI, poor quality but no significant abnormalities Continue conservative management.  NPO.  IV fluid support.   Seizure precautions.  Fall precautions.  Avoid sedative and opiate medications. Palliative care on board discussed with son who agreed with cortrak, consider transition to comfort care if no improvement. -orders placed of cortrak  Seizure disorder. Depakote, phenytoin levels abnormal. continue valproate IV. EEG no active seizures. Continue seizure precautions.  Hypomagnesemia. -replete  Hypophosphatemia -replete  Paroxysmal atrial fibrillation. Heart rate well-controlled.   -Continue Eliquis.  Hypertension. -PRNs while NPO  Chronic anxiety and depression. Patient is on Cymbalta, benztropine at home- resume after NG tube placed Avoid Klonopin, Ativan if lethargic.  Type 2 diabetes mellitus. Continue Accu-Cheks, sliding scale insulin. Hypoglycemia protocol.  Obesity. Estimated  body mass index is 78.46 kg/m as calculated from the following:   Height as of 01/27/23: 3\' 7"  (1.092 m).   Weight as of this encounter: 93.6 kg.  -patient has b/l AKA-- suspect inaccurate  CKD stage IIIa. Creatinine stable. Avoid nephrotoxic drugs. Hold demedex for now  Bilateral AKA. Fall precautions. Supportive care.    Out of bed to chair. Incentive spirometry.     Code Status: Limited: Do not attempt resuscitation (DNR) -DNR-LIMITED -Do Not Intubate/DNI   Subjective:  Awake but non-verbal  Physical Exam: Vitals:   03/10/23 0349 03/10/23 0356 03/10/23 0518 03/10/23 0814  BP: (!) 170/132 (!) 150/110 (!) 162/86 (!) 172/115  Pulse: 100  100 (!) 106  Resp:   18 16  Temp:   98.6 F (37 C) 98 F (36.7 C)  TempSrc:    Oral  SpO2:   96% 97%  Weight:         General: Appearance:    Obese male who appeara anxious     Lungs:     respirations unlabored  Heart:    Tachycardic. .   MS:   Above knee amputation noted  Neurologic:   Awake, alert     Data Reviewed:      Latest Ref Rng & Units 03/08/2023    5:17 AM 03/07/2023    3:42 AM 2023-03-19   10:13 PM  CBC  WBC 4.0 - 10.5 K/uL 11.4  11.8    Hemoglobin 13.0 - 17.0 g/dL 02.7  25.3  66.4   Hematocrit 39.0 - 52.0 % 49.3  47.4  52.0   Platelets 150 - 400 K/uL 164  158        Latest Ref Rng & Units 03/08/2023    5:17 AM 03/07/2023    3:42 AM 2023/03/19   10:13 PM  BMP  Glucose 70 -  99 mg/dL 295  284  132   BUN 8 - 23 mg/dL 20  23  27    Creatinine 0.61 - 1.24 mg/dL 4.40  1.02  7.25   Sodium 135 - 145 mmol/L 142  141  143   Potassium 3.5 - 5.1 mmol/L 3.5  3.6  3.9   Chloride 98 - 111 mmol/L 102  100  103   CO2 22 - 32 mmol/L 26  25    Calcium 8.9 - 10.3 mg/dL 9.4  9.1        Disposition: Status is: Inpatient Remains inpatient appropriate because: Encephalopathy work up, NG feeds, safe dispo, possible comfort care if no clinical improvement  Planned Discharge Destination: Skilled nursing facility   Time  spent: 40 minutes  Author: Joseph Art, DO 03/10/2023 9:00 AM Secure chat 7am to 7pm For on call review [email or web address].

## 2023-03-10 NOTE — Progress Notes (Addendum)
Jevity started at 20 ML. Rate change q6 hrs increasing by 10 ml with a goal of 34ml/hr. Next rate change due at 22:00.

## 2023-03-10 NOTE — Progress Notes (Signed)
Daily Progress Note   Patient Name: Matthew Gutierrez       Date: 03/10/2023 DOB: 06-09-1955  Age: 67 y.o. MRN#: [id number] Attending Physician: Joseph Art, DO Primary Care Physician: Mikki Harbor, MD Admit Date: 03/14/2023  Reason for Consultation/Follow-up: Establishing goals of care  Subjective: Medical records reviewed including progress notes, labs, and imaging. Patient assessed at the bedside. He is calm with cortrak in place, looks at this PA but does not respond to my questions. No family present during my visit.  Called patient's son [name] for ongoing GOC discussions. Provided medical update and review of current medication list at his request. He voices some concerns about sedative effects of Abilify and Trazodone, though he can understand if it is required/needed behaviorally and for rest. [name] is hoping that patient can improve now that his medications will be restarted per tube. We again discussed neurology workup thus far and possibility of underlying dementia if other diagnoses are ruled out, though this is difficult to definitely confirm in the inpatient setting. He is appreciative of the space and opportunity to reflect on his thoughts.   Offered to advocate for further updates from primary team while I am off service and he is very appreciative. Provided with 2W unit phone number for updates as well. [name] is agreeable to further GOC discussions when I am back on service 10/15, currently wishes for more time for ongoing treatment efforts and outcomes. Encouraged him to reach out to PMT should needs arise in the meantime.   Questions and concerns addressed. PMT will continue to support holistically.   Length of Stay: 2   Physical Exam Vitals and nursing note reviewed.   Constitutional:      General: He is not in acute distress. Cardiovascular:     Rate and Rhythm: Normal rate.  Pulmonary:     Effort: Pulmonary effort is normal. No respiratory distress.  Skin:    General: Skin is warm and dry.  Psychiatric:        Behavior: Behavior is slowed.        Cognition and Memory: Cognition is impaired.            Vital Signs: BP (!) 172/115 (BP Location: Right Arm)   Pulse (!) 106   Temp 98 F (36.7 C) (Oral)   Resp  16   Wt 93.6 kg   SpO2 97%   BMI 78.46 kg/m  SpO2: SpO2: 97 % O2 Device: O2 Device: Nasal Cannula O2 Flow Rate: O2 Flow Rate (L/min): 1 L/min      Palliative Assessment/Data: 10-20%    Palliative Care Assessment & Plan   Patient Profile: 67 y.o. male  with past medical history of paroxysmal A-fib on Eliquis, peripheral artery disease status post bilateral above-the-knee amputation, hyperlipidemia, seizure disorder admitted on 03/17/2023 with code stroke after aphasia x 2 days.    Patient's last well-known was 2 days prior to presentation.  Noncontrast head CT and MRI were nonacute in the ED.  Neurology recommended EEG and Depakote level, which is subtherapeutic.  Patient admitted for stroke workup and acute metabolic encephalopathy.  Concern for TIA versus seizure.   PMT has been consulted to assist with goals of care conversation and symptom management.  Assessment: Goals of care conversation Acute metabolic encephalopathy Seizure disorder Debility and deconditioning Failure to thrive  Recommendations/Plan: Continue DNR/DNI Continue current care and allow more time for outcomes. Son is hopeful for improvement while also realistic that patient may have untreatable progressive underlying chronic illness Ongoing goals of care discussions pending clinical course Psychosocial and emotional support provided PMT will see again 10/15. Please call team line should urgent needs arise in the interim    Prognosis:  Poor  Discharge  Planning: To Be Determined  Care plan was discussed with patient, MD, patient's son   Total time: I spent 35 minutes in the care of the patient today in the above activities and documenting the encounter.         Vickye Astorino Jeni Salles, PA-C  Palliative Medicine Team Team phone # [phone]  Thank you for allowing the Palliative Medicine Team to assist in the care of this patient. Please utilize secure chat with additional questions, if there is no response within 30 minutes please call the above phone number.  Palliative Medicine Team providers are available by phone from 7am to 7pm daily and can be reached through the team cell phone.  Should this patient require assistance outside of these hours, please call the patient's attending physician.

## 2023-03-10 NOTE — Progress Notes (Signed)
Speech Language Pathology Treatment: Dysphagia  Patient Details Name: Matthew Gutierrez MRN: [id number] DOB: 01/17/1956 Today's Date: 03/10/2023 Time: 4098-1191 SLP Time Calculation (min) (ACUTE ONLY): 10 min  Assessment / Plan / Recommendation Clinical Impression  Pt seen with standing thick secretions in oral cavity. Removed with suction. Pt needed max cues to close mouth on ice or spoon. He eventually took sips of water but effort was weak and concerning for a silent aspiration. Pt ultimately will need instrumental testing, but hopeful that his mentation has room to improve a bit more. Currently he needs heavy cueing for awareness of PO and will be very difficult to assess. Will f/u in the coming days with potential for diet initiation.   HPI HPI: Matthew Gutierrez is a 67 y.o. male who presented from SNF as a code stroke. Head CT 10/7 negative for acute findings.  MRI could not be completed. CXR 10/7 suggestive of atalectasis. Pt with medical history significant for paroxysmal A-fib on Eliquis, peripheral artery disease status post bilateral above-the-knee amputation, hyperlipidemia, seizure disorder.      SLP Plan  Continue with current plan of care      Recommendations for follow up therapy are one component of a multi-disciplinary discharge planning process, led by the attending physician.  Recommendations may be updated based on patient status, additional functional criteria and insurance authorization.    Recommendations  Diet recommendations: NPO Medication Administration: Via alternative means                  Oral care QID     Dysphagia, oral phase (R13.11)     Continue with current plan of care   Harlon Ditty, MA CCC-SLP  Acute Rehabilitation Services Secure Chat Preferred Office [phone]   Claudine Mouton  03/10/2023, 2:52 PM

## 2023-03-10 NOTE — Procedures (Signed)
Cortrak  Person Inserting Tube:  Kendell Bane C, RD Tube Type:  Cortrak - 43 inches Tube Size:  10 Tube Location:  Right nare Secured by: Bridle Technique Used to Measure Tube Placement:  Marking at nare/corner of mouth Cortrak Secured At:  70 cm   Cortrak Tube Team Note:  Consult received to place a Cortrak feeding tube.   X-ray is required, abdominal x-ray has been ordered by the Cortrak team. Please confirm tube placement before using the Cortrak tube.   If the tube becomes dislodged please keep the tube and contact the Cortrak team at [email or web address] for replacement.  If after hours and replacement cannot be delayed, place a NG tube and confirm placement with an abdominal x-ray.    Cammy Copa., RD, LDN, CNSC See AMiON for contact information

## 2023-03-10 NOTE — TOC Initial Note (Signed)
Transition of Care [facility]) - Initial/Assessment Note    Patient Details  Name: Matthew Gutierrez MRN: [id number] Date of Birth: [birth year]  [facility] CM/SW Contact:    Maansi Wike A [place], LCSWA Phone Number: 03/10/2023, 4:54 PM  Clinical Narrative:                  Pt from [facility]. CSW contacted pt's facility and confirmed he is able to return once medically stable.   TOC will continue to follow.   Expected Discharge Plan: Skilled Nursing Facility Barriers to Discharge: Continued Medical Work up   Patient Goals and CMS Choice            Expected Discharge Plan and Services       Living arrangements for the past 2 months: Skilled Nursing Facility                                      Prior Living Arrangements/Services Living arrangements for the past 2 months: Skilled Nursing Facility Lives with:: Facility Resident          Need for Family Participation in Patient Care: Yes (Comment) Care giver support system in place?: Yes (comment)      Activities of Daily Living   ADL Screening (condition at time of admission) Independently performs ADLs?: No Does the patient have a NEW difficulty with bathing/dressing/toileting/self-feeding that is expected to last >3 days?: Yes (Initiates electronic notice to provider for possible OT consult) Does the patient have a NEW difficulty with getting in/out of bed, walking, or climbing stairs that is expected to last >3 days?: Yes (Initiates electronic notice to provider for possible PT consult) Does the patient have a NEW difficulty with communication that is expected to last >3 days?: Yes (Initiates electronic notice to provider for possible SLP consult) Is the patient deaf or have difficulty hearing?: Yes Does the patient have difficulty seeing, even when wearing glasses/contacts?: Yes Does the patient have difficulty concentrating, remembering, or making decisions?:  Yes  Permission Sought/Granted                  Emotional Assessment   Attitude/Demeanor/Rapport: Unable to Assess Affect (typically observed): Unable to Assess Orientation: : Oriented to Self Alcohol / Substance Use: Not Applicable Psych Involvement: No (comment)  Admission diagnosis:  Altered mental status, unspecified altered mental status type [R41.82] AMS (altered mental status) [R41.82] Encephalopathy [G93.40] Patient Active Problem List   Diagnosis Date Noted   AMS (altered mental status) 2023-03-09   Chronic ulcer of left leg, with fat layer exposed (HCC) 12/30/2021   PAOD (peripheral arterial occlusive disease) (HCC) 12/30/2021   S/P above knee amputation, right (HCC) 05/06/2021   Acute respiratory distress    Abnormal magnetic resonance cholangiopancreatography (MRCP)    Calculus of bile duct with acute cholangitis with obstruction    Elevated LFTs    Acute cholecystitis 12/29/2020   Sepsis (HCC) 12/29/2020   Seizure disorder (HCC) 12/29/2020   Encephalopathy 12/29/2020   Choledocholithiasis    Schizoaffective disorder (HCC) 03/14/2020   Impaired ambulation 03/14/2020   Epilepsy, grand mal (HCC)    Frozen shoulder    Lumbago    Peripheral vascular disease (HCC)    Stroke (HCC)    Paroxysmal atrial fibrillation (HCC) 01/07/2020   Secondary hypercoagulable state (HCC) 01/07/2020   Cellulitis of leg 12/14/2019   Atrial fibrillation with  RVR (HCC) 12/14/2019   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HCC) 10/15/2019   Gout attack 10/06/2019   Urinary incontinence 07/25/2019   Shoulder bursitis 07/25/2019   Acute renal failure superimposed on stage 3 chronic kidney disease (HCC) 07/23/2019   Mixed hyperlipidemia 07/23/2019   Absence epileptic syndrome, not intractable, without status epilepticus (HCC) 07/23/2019   Atherosclerotic heart disease 07/23/2019   Diabetic neuropathy (HCC) 07/23/2019   Benign enlargement of prostate  07/23/2019   COPD (chronic obstructive pulmonary disease) (HCC) 07/23/2019   Long term (current) use of anticoagulants 07/23/2019   Morbid obesity (HCC) 07/23/2019   Chronic venous hypertension (idiopathic) with ulcer of left lower extremity (HCC) 07/23/2019   Obstructive sleep apnea 07/23/2019   Incontinence without sensory awareness 07/23/2019   Chronic respiratory failure (HCC) 07/23/2019   Body mass index 50.0-59.9, adult (HCC) 07/23/2019   H/O acute myocardial infarction 01/22/2018   Essential hypertension 01/22/2018   H/O: stroke 01/22/2018   History of DVT (deep vein thrombosis) 01/22/2018   Lymphedema of both lower extremities 01/22/2018   Type 2 diabetes mellitus without complication, without long-term current use of insulin (HCC) 01/22/2018   Peripheral vascular disease, unspecified (HCC) 07/12/2011   PCP:  Mikki Harbor, MD Pharmacy:   [facility] - [place], [place] - [address] [place] [address] Phone: [phone] Fax: [phone]  [facility] - [place], [place] - [address] [place] [address] [place] [address] [place] [address] Phone: [phone] Fax: [phone]  [facility] [place], [place] - [address] [place] [address] Phone: [phone] Fax: [phone]  [facility] - [address] [place] - [address] [place] [address] Phone: [phone] Fax: [phone]     Social Determinants of Health (SDOH) Social History: SDOH Screenings   Food Insecurity: No Food Insecurity (03/07/2023)  Housing: Low Risk  (03/07/2023)  Transportation Needs: No Transportation Needs (03/07/2023)  Utilities: Not At Risk (03/07/2023)  Depression (PHQ2-9): Low Risk  (10/15/2019)  Recent Concern: Depression (PHQ2-9) - Medium Risk (07/23/2019)  Physical Activity: Inactive (07/19/2019)  Tobacco Use: Medium Risk (23-Mar-2023)   SDOH Interventions:     Readmission Risk  Interventions     No data to display

## 2023-03-11 DIAGNOSIS — R4182 Altered mental status, unspecified: Secondary | ICD-10-CM | POA: Diagnosis not present

## 2023-03-11 LAB — GLUCOSE, CAPILLARY
Glucose-Capillary: 175 mg/dL — ABNORMAL HIGH (ref 70–99)
Glucose-Capillary: 202 mg/dL — ABNORMAL HIGH (ref 70–99)
Glucose-Capillary: 214 mg/dL — ABNORMAL HIGH (ref 70–99)
Glucose-Capillary: 224 mg/dL — ABNORMAL HIGH (ref 70–99)
Glucose-Capillary: 229 mg/dL — ABNORMAL HIGH (ref 70–99)
Glucose-Capillary: 242 mg/dL — ABNORMAL HIGH (ref 70–99)

## 2023-03-11 LAB — CBC
HCT: 49.7 % (ref 39.0–52.0)
Hemoglobin: 15.8 g/dL (ref 13.0–17.0)
MCH: 29.4 pg (ref 26.0–34.0)
MCHC: 31.8 g/dL (ref 30.0–36.0)
MCV: 92.4 fL (ref 80.0–100.0)
Platelets: 180 10*3/uL (ref 150–400)
RBC: 5.38 MIL/uL (ref 4.22–5.81)
RDW: 13.9 % (ref 11.5–15.5)
WBC: 11.8 10*3/uL — ABNORMAL HIGH (ref 4.0–10.5)
nRBC: 0 % (ref 0.0–0.2)

## 2023-03-11 LAB — BASIC METABOLIC PANEL
Anion gap: 12 (ref 5–15)
BUN: 26 mg/dL — ABNORMAL HIGH (ref 8–23)
CO2: 28 mmol/L (ref 22–32)
Calcium: 9 mg/dL (ref 8.9–10.3)
Chloride: 102 mmol/L (ref 98–111)
Creatinine, Ser: 1.18 mg/dL (ref 0.61–1.24)
GFR, Estimated: >60 mL/min (ref >60)
Glucose, Bld: 195 mg/dL — ABNORMAL HIGH (ref 70–99)
Potassium: 3 mmol/L — ABNORMAL LOW (ref 3.5–5.1)
Sodium: 142 mmol/L (ref 135–145)

## 2023-03-11 LAB — MAGNESIUM: Magnesium: 1.6 mg/dL — ABNORMAL LOW (ref 1.7–2.4)

## 2023-03-11 LAB — PHOSPHORUS: Phosphorus: 2.2 mg/dL — ABNORMAL LOW (ref 2.5–4.6)

## 2023-03-11 MED ORDER — POTASSIUM PHOSPHATES 15 MMOLE/5ML IV SOLN
30.0000 mmol | Freq: Once | INTRAVENOUS | Status: AC
  Administered 2023-03-11: 30 mmol via INTRAVENOUS
  Filled 2023-03-11: qty 10

## 2023-03-11 MED ORDER — INSULIN ASPART 100 UNIT/ML IJ SOLN
0.0000 [IU] | Freq: Three times a day (TID) | INTRAMUSCULAR | Status: DC | PRN
  Administered 2023-03-11 (×2): 2 [IU] via SUBCUTANEOUS

## 2023-03-11 MED ORDER — MAGNESIUM SULFATE 2 GM/50ML IV SOLN
2.0000 g | Freq: Once | INTRAVENOUS | Status: AC
  Administered 2023-03-11: 2 g via INTRAVENOUS
  Filled 2023-03-11: qty 50

## 2023-03-11 MED ORDER — INSULIN ASPART 100 UNIT/ML IJ SOLN
0.0000 [IU] | Freq: Every day | INTRAMUSCULAR | Status: DC
  Administered 2023-03-11: 2 [IU] via SUBCUTANEOUS

## 2023-03-11 MED ORDER — INSULIN ASPART 100 UNIT/ML IJ SOLN: 0.0000 [IU] | Freq: Three times a day (TID) | INTRAMUSCULAR | Status: DC

## 2023-03-11 NOTE — Plan of Care (Signed)
  Problem: Clinical Measurements: Goal: Ability to maintain clinical measurements within normal limits will improve Outcome: Progressing Goal: Will remain free from infection Outcome: Progressing Goal: Diagnostic test results will improve Outcome: Progressing Goal: Respiratory complications will improve Outcome: Progressing Goal: Cardiovascular complication will be avoided Outcome: Progressing   Problem: Activity: Goal: Risk for activity intolerance will decrease Outcome: Progressing   Problem: Nutrition: Goal: Adequate nutrition will be maintained Outcome: Progressing   Problem: Coping: Goal: Level of anxiety will decrease Outcome: Progressing   Problem: Elimination: Goal: Will not experience complications related to bowel motility Outcome: Progressing Goal: Will not experience complications related to urinary retention Outcome: Progressing   Problem: Pain Managment: Goal: General experience of comfort will improve Outcome: Progressing   Problem: Safety: Goal: Ability to remain free from injury will improve Outcome: Progressing   Problem: Skin Integrity: Goal: Risk for impaired skin integrity will decrease Outcome: Progressing   Problem: Coping: Goal: Ability to adjust to condition or change in health will improve Outcome: Progressing   Problem: Fluid Volume: Goal: Ability to maintain a balanced intake and output will improve Outcome: Progressing   Problem: Metabolic: Goal: Ability to maintain appropriate glucose levels will improve Outcome: Progressing   Problem: Nutritional: Goal: Maintenance of adequate nutrition will improve Outcome: Progressing Goal: Progress toward achieving an optimal weight will improve Outcome: Progressing   Problem: Skin Integrity: Goal: Risk for impaired skin integrity will decrease Outcome: Progressing   Problem: Tissue Perfusion: Goal: Adequacy of tissue perfusion will improve Outcome: Progressing   Problem:  Education: Goal: Knowledge of General Education information will improve Description: Including pain rating scale, medication(s)/side effects and non-pharmacologic comfort measures Outcome: Not Progressing Note: Patient with AMS, unable to assess knowledge of general educations that were given   Problem: Education: Goal: Ability to describe self-care measures that may prevent or decrease complications (Diabetes Survival Skills Education) will improve Outcome: Not Progressing Goal: Individualized Educational Video(s) Outcome: Not Progressing   Problem: Health Behavior/Discharge Planning: Goal: Ability to identify and utilize available resources and services will improve Outcome: Not Progressing Goal: Ability to manage health-related needs will improve Outcome: Not progressing

## 2023-03-11 NOTE — Progress Notes (Addendum)
Patient's nurse reporting blood glucose is 202.  Per chart review patient is NPO and on tube feeding. - Continue sliding scale and at bedtime insulin as needed coverage.  Continue POC blood glucose check every 6 hours.   [name], MD [facility] 03/11/2023, 1:25 AM

## 2023-03-11 NOTE — Progress Notes (Signed)
PROGRESS NOTE    Matthew Gutierrez  ZOX:[id number] DOB: [birth year] DOA: 10-Mar-2023 PCP: Mikki Harbor, MD    Brief Narrative:  This 67 year old male with PMH significant of paroxysmal A-fib on Eliquis, peripheral arterial disease status post bilateral above-the-knee amputation, hyperlipidemia, seizure disorder and long-term nursing home resident brought from nursing home with about 2 days history of difficulty speaking and less responsiveness. In the ED code stroke was called. Initial noncontrast CT head was nonacute. Evaluated by neurology and recommended admission for altered mentation.  During hospital stay, he remained more sleepy, lethargic.  Seen by SLP advised strict NPO. Palliative care team was involved who discussed with son who agreed for cortrak but no PEG tube and consider transition to comfort care if no improvement.  Cortrak being placed 10/11.  Patient is slowly improving.  Assessment & Plan:   Principal Problem:   AMS (altered mental status) Active Problems:   Encephalopathy  Acute metabolic encephalopathy: Neurology evaluation appreciated. EEG > No evidence of seizure activity. MRI > poor quality but no significant abnormalities Continue conservative management.  NPO.  IV fluid support.   Continue Seizure precautions.  Fall precautions.  Avoid sedative and opiate medications. Palliative care on board discussed with son who agreed with cortrak,  Consider transition to comfort care if no improvement. Continue feeds through the core track.   Seizure disorder: Depakote, phenytoin levels abnormal. Continue valproate IV. EEG showed  No active seizures. Continue seizure precautions.   Hypomagnesemia: Replaced.  Continue to monitor.   Hypophosphatemia: Replaced.  Continue to monitor   Paroxysmal atrial fibrillation: Heart rate well-controlled.   -Continue Eliquis.   Essential hypertension: Continue hydralazine IV as needed while NPO.   Chronic anxiety and  depression: Patient is on Cymbalta, benztropine at home, Continue with core track. Avoid Klonopin, Ativan if lethargic.   Type 2 diabetes mellitus: Continue Accu-Cheks, sliding scale insulin. Hypoglycemia protocol.   Obesity: Estimated body mass index is 78.46 kg/m as calculated from the following:   Height as of 01/27/23: 3\' 7"  (1.092 m).   Weight as of this encounter: 93.6 kg.  -patient has b/l AKA-- suspect inaccurate   CKD stage IIIa: Serum Creatinine stable. Avoid nephrotoxic drugs. Hold demedex for now   Bilateral AKA: Fall precautions. Supportive care.     DVT prophylaxis: Eliquis Code Status:  DNR Family Communication: No family at bed side. Disposition Plan:   Status is: Inpatient Remains inpatient appropriate because: Admitted from skilled nursing facility with altered mentation and poor responsiveness.  Palliative care is on board.  Plan is if no improvement care would be transitioned to comfort care   Consultants:  Palliative care.  Procedures:   Antimicrobials:  Anti-infectives (From admission, onward)    None      Subjective: Patient was seen and examined at bedside.  Overnight events noted.   Patient is very slow in response. He is awake,  arousable, following commands.  Objective: Vitals:   03/11/23 0149 03/11/23 0352 03/11/23 0427 03/11/23 0809  BP: (!) 160/94  (!) 158/94 (!) 153/88  Pulse: 96  97 88  Resp: 17  18 18   Temp: 98.9 F (37.2 C)  99.1 F (37.3 C) 98.7 F (37.1 C)  TempSrc: Oral  Oral Oral  SpO2: 98%  97% 97%  Weight:  92.6 kg    Height:        Intake/Output Summary (Last 24 hours) at 03/11/2023 1051 Last data filed at 03/11/2023 0549 Gross per 24 hour  Intake 970.2 ml  Output 300 ml  Net 670.2 ml   Filed Weights   03/04/2023 2200 03/11/23 0352  Weight: 93.6 kg 92.6 kg    Examination:  General exam: Appears calm and comfortable, deconditioned, Slow response time. Respiratory system: CTA bilaterally. Respiratory  effort normal. RR 15 Cardiovascular system: S1 & S2 heard, RRR. No JVD, murmurs, rubs, gallops or clicks. No pedal edema. Gastrointestinal system: Abdomen is nondistended, soft and nontender. Normal bowel sounds heard. Central nervous system: Alert and oriented x 2. No focal neurological deficits. Slow response time Extremities: Bilateral AKA.  Skin: No rashes, lesions or ulcers Psychiatry: Judgement and insight appear normal. Mood & affect appropriate.     Data Reviewed: I have personally reviewed following labs and imaging studies  CBC: Recent Labs  Lab 03/08/2023 2212 03/19/2023 2213 03/07/23 0342 03/08/23 0517 03/11/23 0454  WBC 11.7*  --  11.8* 11.4* 11.8*  NEUTROABS 8.7*  --   --  8.0*  --   HGB 16.2 17.7* 14.9 15.3 15.8  HCT 51.7 52.0 47.4 49.3 49.7  MCV 96.8  --  94.8 94.8 92.4  PLT 159  --  158 164 180   Basic Metabolic Panel: Recent Labs  Lab 03/05/2023 2212 03/08/2023 2213 03/07/23 0342 03/08/23 0517 03/10/23 1232 03/10/23 1757 03/11/23 0454  NA 141 143 141 142  --   --  142  K 3.8 3.9 3.6 3.5  --   --  3.0*  CL 101 103 100 102  --   --  102  CO2 26  --  25 26  --   --  28  GLUCOSE 111* 108* 108* 102*  --   --  195*  BUN 22 27* 23 20  --   --  26*  CREATININE 1.35* 1.20 1.39* 1.24  --   --  1.18  CALCIUM 9.4  --  9.1 9.4  --   --  9.0  MG  --   --  1.3* 2.1 2.3 1.6* 1.6*  PHOS  --   --  2.4* 3.0 1.8* 2.4* 2.2*   GFR: Estimated Creatinine Clearance: 65.8 mL/min (by C-G formula based on SCr of 1.18 mg/dL). Liver Function Tests: Recent Labs  Lab 03/27/2023 2212 03/07/23 0342 03/08/23 0517  AST 26 27 24   ALT 17 18 18   ALKPHOS 40 39 40  BILITOT 1.3* 1.1 1.3*  PROT 7.8 7.5 7.2  ALBUMIN 3.7 3.4* 3.2*   No results for input(s): "LIPASE", "AMYLASE" in the last 168 hours. No results for input(s): "AMMONIA" in the last 168 hours. Coagulation Profile: Recent Labs  Lab 03/13/2023 2212  INR 1.3*   Cardiac Enzymes: No results for input(s): "CKTOTAL", "CKMB",  "CKMBINDEX", "TROPONINI" in the last 168 hours. BNP (last 3 results) No results for input(s): "PROBNP" in the last 8760 hours. HbA1C: No results for input(s): "HGBA1C" in the last 72 hours. CBG: Recent Labs  Lab 03/10/23 1705 03/10/23 2116 03/11/23 0053 03/11/23 0556 03/11/23 0811  GLUCAP 160* 185* 202* 175* 214*   Lipid Profile: No results for input(s): "CHOL", "HDL", "LDLCALC", "TRIG", "CHOLHDL", "LDLDIRECT" in the last 72 hours. Thyroid Function Tests: No results for input(s): "TSH", "T4TOTAL", "FREET4", "T3FREE", "THYROIDAB" in the last 72 hours. Anemia Panel: No results for input(s): "VITAMINB12", "FOLATE", "FERRITIN", "TIBC", "IRON", "RETICCTPCT" in the last 72 hours. Sepsis Labs: Recent Labs  Lab 03/07/23 0423  LATICACIDVEN 1.5    No results found for this or any previous visit (from the past 240 hour(s)).   Radiology Studies: DG Abd  Portable 1V  Result Date: 03/10/2023 CLINICAL DATA:  Feeding tube placement. EXAM: PORTABLE ABDOMEN - 1 VIEW COMPARISON:  March 07, 2023. FINDINGS: Distal tip of feeding tube is seen in expected position of distal stomach. IMPRESSION: Distal tip of feeding tube seen in expected position of distal stomach. Electronically Signed   By: Lupita Raider M.D.   On: 03/10/2023 12:10    Scheduled Meds:  apixaban  5 mg Per Tube BID   ARIPiprazole  5 mg Per Tube Daily   aspirin  81 mg Per Tube Daily   atorvastatin  20 mg Per Tube Daily   benztropine  0.5 mg Per Tube BID   calcitRIOL  0.5 mcg Per Tube Daily   DULoxetine  60 mg Oral Daily   free water  150 mL Per Tube Q4H   gabapentin  200 mg Per Tube Q8H   insulin aspart  0-5 Units Subcutaneous QHS   sodium chloride flush  10 mL Intravenous Q12H   sodium chloride flush  3 mL Intravenous Q12H   traZODone  50 mg Per Tube QHS   Continuous Infusions:  feeding supplement (JEVITY 1.5 CAL/FIBER) 40 mL/hr at 03/11/23 0548   magnesium sulfate bolus IVPB 2 g (03/11/23 1028)   potassium PHOSPHATE  IVPB (in mmol)     valproate sodium Stopped (03/10/23 1511)   valproate sodium 375 mg (03/11/23 1018)     LOS: 3 days    Time spent: 50 mins    Willeen Niece, MD [facility]   If 7PM-7AM, please contact night-coverage

## 2023-03-11 NOTE — Progress Notes (Signed)
Patient scored a yellow MEWS due to heart rate >100 (105, 102). [name], MD was notified and made aware.  Matthew Gutierrez

## 2023-03-11 NOTE — Progress Notes (Signed)
Eliquis and Trazodone medication was waste after crush medicine fell to the floor, this was also witness by Hdin, RN.

## 2023-03-31 NOTE — Significant Event (Signed)
Rapid Response Event Note   Reason for Call :  HR-30s  Initial Focused Assessment:  Pt lying in bed with eyes closed. He is not breathing and has no palpable or audible pulse. He is in PEA on the monitor.  Pt is a DNR/DNI.   Pt's rhythm then progressed to asystole on the monitor.  Pt pronounced by 2W staff.   Interventions:  No RRT inteventions needed.   Plan of Care:  Nursing staff to alert MD  and family.   Event Summary:   MD Notified: per nursing staff Call [phone] Arrival [phone] End Time:0310  Terrilyn Saver, RN

## 2023-03-31 NOTE — Progress Notes (Addendum)
Patient developed bradycardia 30 and rapid response called.  Patient was DNR and DNI.  Rapid response provider Marcellina Millin, RN informed at the bedside and found patient lying on the bed close type.  He did not have any breathing response and no palpable audible pulse.  Patient has PEA on the monitor.  The rhythm then progressed to asystole on the monitor.  Bedside nurse informing that patient passed away peacefully and pronounced death at 03:06am April 02, 2023.  I have informed daytime physician Dr. Yvone Neu about this and requesting for discharge summary as death and death certification.  [name], MD [facility] 04-02-2023, 4:02 AM

## 2023-03-31 NOTE — Death Summary Note (Signed)
Death Summary  Matthew Gutierrez QIO:[id number] DOB: 05-28-1956 DOA: 03-23-23  PCP: Mikki Harbor, MD  Admit date: 03/23/2023 Date of Death: 2023/03/29 Time of Death: 03:06 am Notification: Mikki Harbor, MD notified of death of 2023/03/30   History of present illness:  Matthew Gutierrez is 67 y.o. male with PMH significant of paroxysmal A-fib on Eliquis, peripheral arterial disease status post bilateral above-the-knee amputation, hyperlipidemia, seizure disorder and long-term nursing home resident brought from nursing home with about 2 days history of difficulty speaking and less responsiveness. In the ED code stroke was called. Initial noncontrast CT head was nonacute. Evaluated by neurology and recommended admission for altered mentation.  During hospital stay, he remained more sleepy, lethargic.  Seen by SLP advised strict NPO. Palliative care team was involved who discussed with son who agreed for cortrak but no PEG tube and consider transition to comfort care if no improvement.  Cortrak being placed 10/11.  Patient has downward decline there was no improvement.  Patient had poor prognosis. Patient was found not breathing and has no palpable or audible pulse.  On monitor he was found to have PEA , patient is DNR/DNI.  Patient's rhythm then progressed to asystole on monitor.  Patient was pronounced dead on 29-Mar-2023 at 3:06.  Family was notified.   Final Diagnoses:  1. Acute metabolic encephalopathy 2. Electrolyte abnormalities 3.  PEA 4. Seizure disorder    The results of significant diagnostics from this hospitalization (including imaging, microbiology, ancillary and laboratory) are listed below for reference.    Significant Diagnostic Studies: DG Abd Portable 1V  Result Date: 03/10/2023 CLINICAL DATA:  Feeding tube placement. EXAM: PORTABLE ABDOMEN - 1 VIEW COMPARISON:  March 07, 2023. FINDINGS: Distal tip of feeding tube is seen in expected position of distal stomach. IMPRESSION:  Distal tip of feeding tube seen in expected position of distal stomach. Electronically Signed   By: Lupita Raider M.D.   On: 03/10/2023 12:10   MR BRAIN WO CONTRAST  Result Date: 03/07/2023 CLINICAL DATA:  67 year old male code stroke presentation yesterday. Delirium. EXAM: MRI HEAD WITHOUT CONTRAST TECHNIQUE: Multiplanar, multiecho pulse sequences of the brain and surrounding structures were obtained without intravenous contrast. COMPARISON:  Head CT yesterday.  Previous brain MRI 10/21/2021. FINDINGS: The examination had to be discontinued prior to completion due to altered mental status, inability to tolerate despite premedication. Motion degraded DWI, axial T2, FLAIR, T1 imaging, sagittal T1 and coronal T2 imaging provided. ICA siphon flow voids appear preserved. Cerebral volume and morphology appears not significantly changed from last year on axial T2 imaging. Chronic lacunar infarcts in the left basal ganglia, right thalamus. No midline shift, mass effect, or evidence of intracranial mass lesion. No ventriculomegaly. No restricted diffusion is evident. Axial diffusion is especially degraded. Coronal diffusion is of somewhat better quality. Negative visible orbits, paranasal sinuses. IMPRESSION: 1. Limited exam due to significant motion degradation, truncation. 2. No acute intracranial abnormality identified. Chronic small vessel disease in the left basal ganglia and right thalamus. Electronically Signed   By: Odessa Fleming M.D.   On: 03/07/2023 10:08   EEG adult  Result Date: 03/07/2023 Charlsie Quest, MD     03/07/2023  3:47 AM Patient Name: Matthew Gutierrez MRN: [id number] Epilepsy Attending: Charlsie Quest Referring Physician/Provider: Gordy Councilman, MD Date: 03/07/2023 Duration: 25.33 mins Patient history: 67yo M with ams getting eeg to evaluate for seizure Level of alertness: Awake, drowsy AEDs during EEG study: VPA, GBP Technical aspects: This EEG study was  done with scalp electrodes positioned  according to the 10-20 International system of electrode placement. Electrical activity was reviewed with band pass filter of 1-70Hz , sensitivity of 7 uV/mm, display speed of 61mm/sec with a 60Hz  notched filter applied as appropriate. EEG data were recorded continuously and digitally stored.  Video monitoring was available and reviewed as appropriate. Description: The posterior dominant rhythm consists of 7 Hz activity of moderate voltage (25-35 uV) seen predominantly in posterior head regions, symmetric and reactive to eye opening and eye closing. Drowsiness was characterized by attenuation of the posterior background rhythm. EEG showed continuous generalized 3 to 6 Hz theta-delta slowing. Hyperventilation and photic stimulation were not performed.   ABNORMALITY - Continuous slow, generalized IMPRESSION: This study is suggestive of moderate diffuse encephalopathy. No seizures or epileptiform discharges were seen throughout the recording. Charlsie Quest   DG Abd 1 View  Result Date: 03/07/2023 CLINICAL DATA:  Metal screening prior to MRI EXAM: ABDOMEN - 1 VIEW COMPARISON:  None Available. FINDINGS: Telemetry leads overlie the abdomen. No radiopaque foreign body to prohibit MRI. Nonobstructive bowel-gas pattern. Vascular calcifications. Degenerative changes pubic symphysis, both hips, SI joints and lower lumbar spine. IMPRESSION: No radiopaque foreign body to prohibit MRI. Electronically Signed   By: Minerva Fester M.D.   On: 03/07/2023 01:34   DG Chest Port 1 View  Result Date: 03/03/2023 CLINICAL DATA:  Altered mental status EXAM: PORTABLE CHEST 1 VIEW COMPARISON:  01/03/2021 FINDINGS: Low lung volumes. Subsegmental atelectasis at the bases. Enlarged cardiomediastinal silhouette. No pneumothorax. IMPRESSION: Low lung volumes with subsegmental atelectasis at the bases. Electronically Signed   By: Jasmine Pang M.D.   On: 03/09/2023 23:39   CT HEAD CODE STROKE WO CONTRAST  Result Date:  03/21/2023 CLINICAL DATA:  Code stroke. Initial evaluation for neuro deficit, stroke suspected. EXAM: CT HEAD WITHOUT CONTRAST TECHNIQUE: Contiguous axial images were obtained from the base of the skull through the vertex without intravenous contrast. RADIATION DOSE REDUCTION: This exam was performed according to the departmental dose-optimization program which includes automated exposure control, adjustment of the mA and/or kV according to patient size and/or use of iterative reconstruction technique. COMPARISON:  Prior study from 10/21/2021 FINDINGS: Brain: Examination degraded by motion and streak artifact. Age-related cerebral atrophy with mild chronic microvascular ischemic disease. No acute intracranial hemorrhage. No acute large vessel territory infarct. No mass lesion or midline shift. No hydrocephalus or extra-axial fluid collection. Vascular: No abnormal hyperdense vessel. Calcified atherosclerosis present at the skull base. Skull: Scalp soft tissues within normal limits.  Calvarium intact. Sinuses/Orbits: Globes and orbital soft tissues grossly within normal limits. Visualized paranasal sinuses are clear. Small chronic appearing right mastoid effusion noted, of doubtful significance. Other: None. ASPECTS [facility] Stroke Program Early CT Score) - Ganglionic level infarction (caudate, lentiform nuclei, internal capsule, insula, M1-M3 cortex): 7 - Supraganglionic infarction (M4-M6 cortex): 3 Total score (0-10 with 10 being normal): 10 IMPRESSION: 1. No acute intracranial abnormality. 2. ASPECTS is 10. 3. Age-related cerebral atrophy with mild chronic small vessel ischemic disease. These results were communicated to Dr. Iver Nestle at 10:23 pm on 03/17/2023 by text page via the [facility] messaging system. Electronically Signed   By: Rise Mu M.D.   On: 03/22/2023 22:24    Microbiology: No results found for this or any previous visit (from the past 240 hour(s)).   Labs: Basic Metabolic Panel: Recent  Labs  Lab 03/25/2023 2212 03/25/2023 2213 03/07/23 0342 03/08/23 0517 03/10/23 1232 03/10/23 1757 03/11/23 0454  NA 141 143 141  142  --   --  142  K 3.8 3.9 3.6 3.5  --   --  3.0*  CL 101 103 100 102  --   --  102  CO2 26  --  25 26  --   --  28  GLUCOSE 111* 108* 108* 102*  --   --  195*  BUN 22 27* 23 20  --   --  26*  CREATININE 1.35* 1.20 1.39* 1.24  --   --  1.18  CALCIUM 9.4  --  9.1 9.4  --   --  9.0  MG  --   --  1.3* 2.1 2.3 1.6* 1.6*  PHOS  --   --  2.4* 3.0 1.8* 2.4* 2.2*   Liver Function Tests: Recent Labs  Lab 03/18/2023 2212 03/07/23 0342 03/08/23 0517  AST 26 27 24   ALT 17 18 18   ALKPHOS 40 39 40  BILITOT 1.3* 1.1 1.3*  PROT 7.8 7.5 7.2  ALBUMIN 3.7 3.4* 3.2*   No results for input(s): "LIPASE", "AMYLASE" in the last 168 hours. No results for input(s): "AMMONIA" in the last 168 hours. CBC: Recent Labs  Lab 03/15/2023 2212 03/28/2023 2213 03/07/23 0342 03/08/23 0517 03/11/23 0454  WBC 11.7*  --  11.8* 11.4* 11.8*  NEUTROABS 8.7*  --   --  8.0*  --   HGB 16.2 17.7* 14.9 15.3 15.8  HCT 51.7 52.0 47.4 49.3 49.7  MCV 96.8  --  94.8 94.8 92.4  PLT 159  --  158 164 180   Cardiac Enzymes: No results for input(s): "CKTOTAL", "CKMB", "CKMBINDEX", "TROPONINI" in the last 168 hours. D-Dimer No results for input(s): "DDIMER" in the last 72 hours. BNP: Invalid input(s): "POCBNP" CBG: Recent Labs  Lab 03/11/23 0556 03/11/23 0811 03/11/23 1130 03/11/23 1618 03/11/23 2058  GLUCAP 175* 214* 229* 242* 224*   Anemia work up No results for input(s): "VITAMINB12", "FOLATE", "FERRITIN", "TIBC", "IRON", "RETICCTPCT" in the last 72 hours. Urinalysis    Component Value Date/Time   COLORURINE YELLOW 03/29/2023 2333   APPEARANCEUR CLEAR 03/09/2023 2333   LABSPEC 1.020 03/27/2023 2333   PHURINE 5.0 03/30/2023 2333   GLUCOSEU NEGATIVE 03/21/2023 2333   HGBUR MODERATE (A) 03/19/2023 2333   BILIRUBINUR NEGATIVE 03/27/2023 2333   KETONESUR 20 (A) 03/17/2023 2333    PROTEINUR >=300 (A) 03/07/2023 2333   NITRITE NEGATIVE 03/23/2023 2333   LEUKOCYTESUR NEGATIVE 03/07/2023 2333   Sepsis Labs Recent Labs  Lab 03/13/2023 2212 03/07/23 0342 03/08/23 0517 03/11/23 0454  WBC 11.7* 11.8* 11.4* 11.8*   SIGNED:  Willeen Niece, MD  [facility] 03/13/2023, 3:00 PM Pager   If 7PM-7AM, please contact night-coverage

## 2023-03-31 NOTE — Progress Notes (Signed)
Patient expired this shift at 3:05 am after telemetry called stating he had a heart rate in the 30's.  To room.  Found patient without a heart rate and without respirations.  DNI.

## 2023-03-31 NOTE — Progress Notes (Signed)
Called by telemetry to check on patient.  They noted a HR in the 30's.  To room.  Patient found to be without HR and respirations. Called rapid and rapid RN to bedside.  Patient appeared to be expired.  Two nurses verified this and Time of death was 305 am.  The family and MD were notified.  Patient was cleansed and sent to morgue.

## 2023-03-31 DEATH — deceased
